# Patient Record
Sex: Female | Born: 1955 | Race: Black or African American | Hispanic: No | Smoking: Former smoker
Health system: Southern US, Community
[De-identification: ages and names within clinical notes are randomized; demographics above are authoritative.]

## PROBLEM LIST (undated history)

## (undated) ENCOUNTER — Emergency Department (HOSPITAL_COMMUNITY)

## (undated) DIAGNOSIS — C801 Malignant (primary) neoplasm, unspecified: Secondary | ICD-10-CM

## (undated) DIAGNOSIS — I700 Atherosclerosis of aorta: Secondary | ICD-10-CM

## (undated) DIAGNOSIS — I10 Essential (primary) hypertension: Secondary | ICD-10-CM

## (undated) DIAGNOSIS — F29 Unspecified psychosis not due to a substance or known physiological condition: Secondary | ICD-10-CM

## (undated) DIAGNOSIS — T50901A Poisoning by unspecified drugs, medicaments and biological substances, accidental (unintentional), initial encounter: Secondary | ICD-10-CM

## (undated) DIAGNOSIS — F319 Bipolar disorder, unspecified: Secondary | ICD-10-CM

## (undated) DIAGNOSIS — M1990 Unspecified osteoarthritis, unspecified site: Secondary | ICD-10-CM

## (undated) DIAGNOSIS — J45909 Unspecified asthma, uncomplicated: Secondary | ICD-10-CM

## (undated) DIAGNOSIS — R0600 Dyspnea, unspecified: Secondary | ICD-10-CM

## (undated) DIAGNOSIS — E7800 Pure hypercholesterolemia, unspecified: Secondary | ICD-10-CM

## (undated) DIAGNOSIS — F419 Anxiety disorder, unspecified: Secondary | ICD-10-CM

## (undated) DIAGNOSIS — F209 Schizophrenia, unspecified: Secondary | ICD-10-CM

## (undated) DIAGNOSIS — F17200 Nicotine dependence, unspecified, uncomplicated: Secondary | ICD-10-CM

## (undated) DIAGNOSIS — M549 Dorsalgia, unspecified: Secondary | ICD-10-CM

## (undated) DIAGNOSIS — G4700 Insomnia, unspecified: Secondary | ICD-10-CM

## (undated) DIAGNOSIS — G43909 Migraine, unspecified, not intractable, without status migrainosus: Secondary | ICD-10-CM

## (undated) DIAGNOSIS — M7710 Lateral epicondylitis, unspecified elbow: Secondary | ICD-10-CM

## (undated) DIAGNOSIS — F32A Depression, unspecified: Secondary | ICD-10-CM

## (undated) DIAGNOSIS — F410 Panic disorder [episodic paroxysmal anxiety] without agoraphobia: Secondary | ICD-10-CM

## (undated) DIAGNOSIS — K5900 Constipation, unspecified: Secondary | ICD-10-CM

## (undated) DIAGNOSIS — E669 Obesity, unspecified: Secondary | ICD-10-CM

## (undated) HISTORY — PX: ABDOMINAL HYSTERECTOMY: SHX81

## (undated) HISTORY — DX: Depression, unspecified: F32.A

## (undated) HISTORY — DX: Panic disorder (episodic paroxysmal anxiety): F41.0

## (undated) HISTORY — DX: Nicotine dependence, unspecified, uncomplicated: F17.200

## (undated) HISTORY — DX: Atherosclerosis of aorta: I70.0

## (undated) HISTORY — DX: Lateral epicondylitis, unspecified elbow: M77.10

## (undated) HISTORY — DX: Essential (primary) hypertension: I10

## (undated) HISTORY — DX: Insomnia, unspecified: G47.00

## (undated) HISTORY — DX: Constipation, unspecified: K59.00

## (undated) HISTORY — DX: Anxiety disorder, unspecified: F41.9

## (undated) HISTORY — PX: OTHER SURGICAL HISTORY: SHX169

## (undated) HISTORY — DX: Unspecified osteoarthritis, unspecified site: M19.90

## (undated) HISTORY — DX: Migraine, unspecified, not intractable, without status migrainosus: G43.909

## (undated) HISTORY — PX: COLONOSCOPY: SHX5424

## (undated) HISTORY — DX: Obesity, unspecified: E66.9

## (undated) HISTORY — DX: Dorsalgia, unspecified: M54.9

## (undated) HISTORY — DX: Unspecified psychosis not due to a substance or known physiological condition: F29

## (undated) HISTORY — DX: Bipolar disorder, unspecified: F31.9

## (undated) HISTORY — DX: Poisoning by unspecified drugs, medicaments and biological substances, accidental (unintentional), initial encounter: T50.901A

## (undated) HISTORY — DX: Schizophrenia, unspecified: F20.9

---

## 2002-05-08 ENCOUNTER — Emergency Department (HOSPITAL_COMMUNITY): Admission: EM | Admit: 2002-05-08 | Discharge: 2002-05-08 | Payer: Self-pay | Admitting: Emergency Medicine

## 2002-12-29 ENCOUNTER — Emergency Department (HOSPITAL_COMMUNITY): Admission: EM | Admit: 2002-12-29 | Discharge: 2002-12-29 | Payer: Self-pay | Admitting: Emergency Medicine

## 2003-02-06 ENCOUNTER — Emergency Department (HOSPITAL_COMMUNITY): Admission: EM | Admit: 2003-02-06 | Discharge: 2003-02-06 | Payer: Self-pay | Admitting: *Deleted

## 2003-04-09 ENCOUNTER — Emergency Department (HOSPITAL_COMMUNITY): Admission: EM | Admit: 2003-04-09 | Discharge: 2003-04-09 | Payer: Self-pay | Admitting: Emergency Medicine

## 2003-04-09 ENCOUNTER — Inpatient Hospital Stay (HOSPITAL_COMMUNITY): Admission: EM | Admit: 2003-04-09 | Discharge: 2003-04-14 | Payer: Self-pay | Admitting: Psychiatry

## 2003-05-02 ENCOUNTER — Emergency Department (HOSPITAL_COMMUNITY): Admission: EM | Admit: 2003-05-02 | Discharge: 2003-05-02 | Payer: Self-pay | Admitting: Emergency Medicine

## 2003-05-04 ENCOUNTER — Ambulatory Visit (HOSPITAL_COMMUNITY): Admission: RE | Admit: 2003-05-04 | Discharge: 2003-05-04 | Payer: Self-pay | Admitting: Family Medicine

## 2003-05-30 ENCOUNTER — Emergency Department (HOSPITAL_COMMUNITY): Admission: EM | Admit: 2003-05-30 | Discharge: 2003-05-31 | Payer: Self-pay | Admitting: *Deleted

## 2003-06-21 ENCOUNTER — Emergency Department (HOSPITAL_COMMUNITY): Admission: EM | Admit: 2003-06-21 | Discharge: 2003-06-22 | Payer: Self-pay | Admitting: *Deleted

## 2003-08-13 ENCOUNTER — Emergency Department (HOSPITAL_COMMUNITY): Admission: EM | Admit: 2003-08-13 | Discharge: 2003-08-13 | Payer: Self-pay | Admitting: Emergency Medicine

## 2003-08-19 ENCOUNTER — Inpatient Hospital Stay (HOSPITAL_COMMUNITY): Admission: RE | Admit: 2003-08-19 | Discharge: 2003-08-25 | Payer: Self-pay | Admitting: Psychiatry

## 2004-10-19 ENCOUNTER — Emergency Department (HOSPITAL_COMMUNITY): Admission: EM | Admit: 2004-10-19 | Discharge: 2004-10-19 | Payer: Self-pay | Admitting: *Deleted

## 2004-10-21 ENCOUNTER — Emergency Department (HOSPITAL_COMMUNITY): Admission: EM | Admit: 2004-10-21 | Discharge: 2004-10-21 | Payer: Self-pay | Admitting: Emergency Medicine

## 2005-01-10 ENCOUNTER — Emergency Department (HOSPITAL_COMMUNITY): Admission: EM | Admit: 2005-01-10 | Discharge: 2005-01-10 | Payer: Self-pay | Admitting: Emergency Medicine

## 2005-01-30 ENCOUNTER — Emergency Department (HOSPITAL_COMMUNITY): Admission: EM | Admit: 2005-01-30 | Discharge: 2005-01-30 | Payer: Self-pay | Admitting: Emergency Medicine

## 2005-03-27 ENCOUNTER — Emergency Department (HOSPITAL_COMMUNITY): Admission: EM | Admit: 2005-03-27 | Discharge: 2005-03-27 | Payer: Self-pay | Admitting: Emergency Medicine

## 2005-07-06 ENCOUNTER — Inpatient Hospital Stay (HOSPITAL_COMMUNITY): Admission: RE | Admit: 2005-07-06 | Discharge: 2005-07-13 | Payer: Self-pay | Admitting: Psychiatry

## 2005-07-07 ENCOUNTER — Ambulatory Visit: Payer: Self-pay | Admitting: Psychiatry

## 2005-07-08 ENCOUNTER — Encounter: Payer: Self-pay | Admitting: Emergency Medicine

## 2005-07-19 ENCOUNTER — Ambulatory Visit: Payer: Self-pay | Admitting: Family Medicine

## 2005-07-31 ENCOUNTER — Ambulatory Visit (HOSPITAL_COMMUNITY): Admission: RE | Admit: 2005-07-31 | Discharge: 2005-07-31 | Payer: Self-pay | Admitting: Family Medicine

## 2005-08-23 ENCOUNTER — Ambulatory Visit: Payer: Self-pay | Admitting: Family Medicine

## 2005-10-04 ENCOUNTER — Emergency Department (HOSPITAL_COMMUNITY): Admission: EM | Admit: 2005-10-04 | Discharge: 2005-10-04 | Payer: Self-pay | Admitting: Emergency Medicine

## 2005-10-23 ENCOUNTER — Ambulatory Visit: Payer: Self-pay | Admitting: Family Medicine

## 2005-10-23 ENCOUNTER — Ambulatory Visit (HOSPITAL_COMMUNITY): Admission: RE | Admit: 2005-10-23 | Discharge: 2005-10-23 | Payer: Self-pay | Admitting: Family Medicine

## 2006-01-23 ENCOUNTER — Ambulatory Visit: Payer: Self-pay | Admitting: Family Medicine

## 2006-04-03 ENCOUNTER — Ambulatory Visit: Payer: Self-pay | Admitting: Cardiology

## 2006-04-04 ENCOUNTER — Ambulatory Visit: Payer: Self-pay | Admitting: Family Medicine

## 2006-08-18 ENCOUNTER — Emergency Department (HOSPITAL_COMMUNITY): Admission: EM | Admit: 2006-08-18 | Discharge: 2006-08-18 | Payer: Self-pay | Admitting: Emergency Medicine

## 2006-10-16 ENCOUNTER — Ambulatory Visit: Payer: Self-pay | Admitting: Cardiology

## 2006-10-16 ENCOUNTER — Ambulatory Visit (HOSPITAL_COMMUNITY): Admission: EM | Admit: 2006-10-16 | Discharge: 2006-10-17 | Payer: Self-pay | Admitting: Cardiovascular Disease

## 2006-10-30 ENCOUNTER — Ambulatory Visit: Payer: Self-pay | Admitting: Family Medicine

## 2006-11-19 ENCOUNTER — Ambulatory Visit (HOSPITAL_COMMUNITY): Admission: RE | Admit: 2006-11-19 | Discharge: 2006-11-19 | Payer: Self-pay | Admitting: Family Medicine

## 2006-12-20 ENCOUNTER — Encounter: Payer: Self-pay | Admitting: Family Medicine

## 2006-12-20 LAB — CONVERTED CEMR LAB
AST: 15 units/L (ref 0–37)
Albumin: 4.2 g/dL (ref 3.5–5.2)
Alkaline Phosphatase: 69 units/L (ref 39–117)
BUN: 9 mg/dL (ref 6–23)
Bilirubin, Direct: <0.1 mg/dL (ref 0.0–0.3)
CO2: 22 meq/L (ref 19–32)
Calcium: 9.1 mg/dL (ref 8.4–10.5)
Chloride: 106 meq/L (ref 96–112)
Creatinine, Ser: 0.72 mg/dL (ref 0.40–1.20)
Glucose, Bld: 88 mg/dL (ref 70–99)
HDL: 39 mg/dL — ABNORMAL LOW (ref >39)
LDL Cholesterol: 139 mg/dL — ABNORMAL HIGH (ref 0–99)
Total Bilirubin: 0.2 mg/dL — ABNORMAL LOW (ref 0.3–1.2)

## 2006-12-23 ENCOUNTER — Emergency Department (HOSPITAL_COMMUNITY): Admission: EM | Admit: 2006-12-23 | Discharge: 2006-12-23 | Payer: Self-pay | Admitting: Emergency Medicine

## 2006-12-25 ENCOUNTER — Ambulatory Visit: Payer: Self-pay | Admitting: Family Medicine

## 2007-01-28 ENCOUNTER — Emergency Department (HOSPITAL_COMMUNITY): Admission: EM | Admit: 2007-01-28 | Discharge: 2007-01-28 | Payer: Self-pay | Admitting: Emergency Medicine

## 2007-02-19 ENCOUNTER — Emergency Department (HOSPITAL_COMMUNITY): Admission: EM | Admit: 2007-02-19 | Discharge: 2007-02-19 | Payer: Self-pay | Admitting: Emergency Medicine

## 2007-04-11 ENCOUNTER — Emergency Department (HOSPITAL_COMMUNITY): Admission: EM | Admit: 2007-04-11 | Discharge: 2007-04-11 | Payer: Self-pay | Admitting: Emergency Medicine

## 2007-05-09 ENCOUNTER — Ambulatory Visit: Payer: Self-pay | Admitting: Family Medicine

## 2007-06-20 ENCOUNTER — Encounter: Payer: Self-pay | Admitting: Family Medicine

## 2007-11-13 ENCOUNTER — Emergency Department (HOSPITAL_COMMUNITY): Admission: EM | Admit: 2007-11-13 | Discharge: 2007-11-13 | Payer: Self-pay | Admitting: Emergency Medicine

## 2007-11-22 ENCOUNTER — Encounter: Payer: Self-pay | Admitting: Family Medicine

## 2007-11-22 DIAGNOSIS — I10 Essential (primary) hypertension: Secondary | ICD-10-CM | POA: Insufficient documentation

## 2007-11-22 DIAGNOSIS — M549 Dorsalgia, unspecified: Secondary | ICD-10-CM | POA: Insufficient documentation

## 2007-11-22 DIAGNOSIS — E669 Obesity, unspecified: Secondary | ICD-10-CM

## 2007-11-22 DIAGNOSIS — M7710 Lateral epicondylitis, unspecified elbow: Secondary | ICD-10-CM | POA: Insufficient documentation

## 2007-11-22 DIAGNOSIS — F29 Unspecified psychosis not due to a substance or known physiological condition: Secondary | ICD-10-CM | POA: Insufficient documentation

## 2007-11-22 DIAGNOSIS — G43909 Migraine, unspecified, not intractable, without status migrainosus: Secondary | ICD-10-CM | POA: Insufficient documentation

## 2008-03-05 ENCOUNTER — Telehealth: Payer: Self-pay | Admitting: Family Medicine

## 2008-04-30 ENCOUNTER — Emergency Department (HOSPITAL_COMMUNITY): Admission: EM | Admit: 2008-04-30 | Discharge: 2008-04-30 | Payer: Self-pay | Admitting: Emergency Medicine

## 2008-05-08 ENCOUNTER — Telehealth: Payer: Self-pay | Admitting: Family Medicine

## 2008-05-25 ENCOUNTER — Ambulatory Visit: Payer: Self-pay | Admitting: *Deleted

## 2008-05-25 ENCOUNTER — Emergency Department (HOSPITAL_COMMUNITY): Admission: EM | Admit: 2008-05-25 | Discharge: 2008-05-25 | Payer: Self-pay | Admitting: Emergency Medicine

## 2008-05-26 ENCOUNTER — Inpatient Hospital Stay (HOSPITAL_COMMUNITY): Admission: RE | Admit: 2008-05-26 | Discharge: 2008-06-03 | Payer: Self-pay | Admitting: *Deleted

## 2008-06-08 ENCOUNTER — Emergency Department (HOSPITAL_COMMUNITY): Admission: EM | Admit: 2008-06-08 | Discharge: 2008-06-08 | Payer: Self-pay | Admitting: Emergency Medicine

## 2008-06-22 ENCOUNTER — Emergency Department (HOSPITAL_COMMUNITY): Admission: EM | Admit: 2008-06-22 | Discharge: 2008-06-22 | Payer: Self-pay | Admitting: Emergency Medicine

## 2008-07-09 ENCOUNTER — Telehealth: Payer: Self-pay | Admitting: Family Medicine

## 2008-07-09 ENCOUNTER — Ambulatory Visit: Payer: Self-pay | Admitting: Family Medicine

## 2008-07-09 ENCOUNTER — Ambulatory Visit (HOSPITAL_COMMUNITY): Admission: RE | Admit: 2008-07-09 | Discharge: 2008-07-09 | Payer: Self-pay | Admitting: Family Medicine

## 2008-07-09 DIAGNOSIS — R1084 Generalized abdominal pain: Secondary | ICD-10-CM | POA: Insufficient documentation

## 2008-07-09 LAB — CONVERTED CEMR LAB
Bilirubin Urine: NEGATIVE
Glucose, Urine, Semiquant: NEGATIVE
Ketones, urine, test strip: NEGATIVE
Nitrite: NEGATIVE
Protein, U semiquant: NEGATIVE
Specific Gravity, Urine: 1.01
Urobilinogen, UA: 0.2
WBC Urine, dipstick: NEGATIVE
pH: 7

## 2008-07-14 ENCOUNTER — Encounter (HOSPITAL_COMMUNITY): Admission: RE | Admit: 2008-07-14 | Discharge: 2008-08-13 | Payer: Self-pay | Admitting: Family Medicine

## 2008-07-19 DIAGNOSIS — N3000 Acute cystitis without hematuria: Secondary | ICD-10-CM | POA: Insufficient documentation

## 2008-07-20 ENCOUNTER — Emergency Department (HOSPITAL_COMMUNITY): Admission: EM | Admit: 2008-07-20 | Discharge: 2008-07-21 | Payer: Self-pay | Admitting: Emergency Medicine

## 2008-07-23 ENCOUNTER — Ambulatory Visit (HOSPITAL_COMMUNITY): Admission: RE | Admit: 2008-07-23 | Discharge: 2008-07-23 | Payer: Self-pay | Admitting: Family Medicine

## 2008-08-06 ENCOUNTER — Telehealth: Payer: Self-pay | Admitting: Family Medicine

## 2008-08-23 ENCOUNTER — Emergency Department (HOSPITAL_COMMUNITY): Admission: EM | Admit: 2008-08-23 | Discharge: 2008-08-23 | Payer: Self-pay | Admitting: Emergency Medicine

## 2008-09-03 ENCOUNTER — Ambulatory Visit: Payer: Self-pay | Admitting: Family Medicine

## 2008-09-03 DIAGNOSIS — R32 Unspecified urinary incontinence: Secondary | ICD-10-CM

## 2008-09-03 DIAGNOSIS — J209 Acute bronchitis, unspecified: Secondary | ICD-10-CM | POA: Insufficient documentation

## 2008-12-18 ENCOUNTER — Emergency Department (HOSPITAL_COMMUNITY): Admission: EM | Admit: 2008-12-18 | Discharge: 2008-12-18 | Payer: Self-pay | Admitting: Emergency Medicine

## 2008-12-31 ENCOUNTER — Emergency Department (HOSPITAL_COMMUNITY): Admission: EM | Admit: 2008-12-31 | Discharge: 2008-12-31 | Payer: Self-pay | Admitting: Emergency Medicine

## 2008-12-31 ENCOUNTER — Inpatient Hospital Stay (HOSPITAL_COMMUNITY): Admission: AD | Admit: 2008-12-31 | Discharge: 2009-01-03 | Payer: Self-pay | Admitting: Psychiatry

## 2008-12-31 ENCOUNTER — Ambulatory Visit: Payer: Self-pay | Admitting: Psychiatry

## 2009-01-06 ENCOUNTER — Ambulatory Visit: Payer: Self-pay | Admitting: Family Medicine

## 2009-01-12 ENCOUNTER — Telehealth: Payer: Self-pay | Admitting: Family Medicine

## 2009-01-19 ENCOUNTER — Telehealth: Payer: Self-pay | Admitting: Family Medicine

## 2009-01-20 ENCOUNTER — Telehealth: Payer: Self-pay | Admitting: Family Medicine

## 2009-04-28 ENCOUNTER — Ambulatory Visit: Payer: Self-pay | Admitting: Family Medicine

## 2009-04-28 LAB — CONVERTED CEMR LAB
Bilirubin Urine: NEGATIVE
Glucose, Urine, Semiquant: NEGATIVE
WBC Urine, dipstick: NEGATIVE
pH: 5.5

## 2009-04-29 ENCOUNTER — Encounter: Payer: Self-pay | Admitting: Family Medicine

## 2009-04-30 ENCOUNTER — Telehealth: Payer: Self-pay | Admitting: Family Medicine

## 2009-05-04 ENCOUNTER — Telehealth: Payer: Self-pay | Admitting: Family Medicine

## 2009-05-04 ENCOUNTER — Encounter: Payer: Self-pay | Admitting: Family Medicine

## 2009-05-05 ENCOUNTER — Encounter: Payer: Self-pay | Admitting: Family Medicine

## 2009-05-28 ENCOUNTER — Encounter: Payer: Self-pay | Admitting: Family Medicine

## 2009-09-19 ENCOUNTER — Emergency Department (HOSPITAL_COMMUNITY): Admission: EM | Admit: 2009-09-19 | Discharge: 2009-09-19 | Payer: Self-pay | Admitting: Emergency Medicine

## 2010-01-13 ENCOUNTER — Ambulatory Visit: Payer: Self-pay | Admitting: Family Medicine

## 2010-02-28 ENCOUNTER — Telehealth: Payer: Self-pay | Admitting: Family Medicine

## 2010-02-28 ENCOUNTER — Encounter: Payer: Self-pay | Admitting: Family Medicine

## 2010-03-27 ENCOUNTER — Emergency Department (HOSPITAL_COMMUNITY)
Admission: EM | Admit: 2010-03-27 | Discharge: 2010-03-28 | Disposition: A | Discharge status: Other Acute Inpatient Hospital | Payer: Self-pay | Source: Home / Self Care | Admitting: Emergency Medicine

## 2010-03-28 ENCOUNTER — Ambulatory Visit: Payer: Self-pay | Admitting: Psychiatry

## 2010-03-28 ENCOUNTER — Inpatient Hospital Stay (HOSPITAL_COMMUNITY): Admission: AD | Admit: 2010-03-28 | Discharge: 2010-04-01 | Payer: Self-pay | Admitting: Psychiatry

## 2010-04-04 ENCOUNTER — Emergency Department (HOSPITAL_COMMUNITY): Admission: EM | Admit: 2010-04-04 | Discharge: 2010-04-04 | Payer: Self-pay | Admitting: Family Medicine

## 2010-04-15 ENCOUNTER — Encounter: Payer: Self-pay | Admitting: Family Medicine

## 2010-04-28 ENCOUNTER — Encounter: Payer: Self-pay | Admitting: Family Medicine

## 2010-05-18 ENCOUNTER — Ambulatory Visit: Payer: Self-pay | Admitting: Family Medicine

## 2010-05-18 DIAGNOSIS — M25519 Pain in unspecified shoulder: Secondary | ICD-10-CM | POA: Insufficient documentation

## 2010-07-10 ENCOUNTER — Encounter: Payer: Self-pay | Admitting: Family Medicine

## 2010-07-19 NOTE — Letter (Signed)
Summary: progress notes  progress notes   Imported By: Curtis Sites 12/03/2009 10:15:36  _____________________________________________________________________  External Attachment:    Type:   Image     Comment:   External Document

## 2010-07-19 NOTE — Letter (Signed)
Summary: demographic  demographic   Imported By: Curtis Sites 12/03/2009 10:10:58  _____________________________________________________________________  External Attachment:    Type:   Image     Comment:   External Document
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
## 2010-07-19 NOTE — Letter (Signed)
Summary: Letter 1st no show  Letter 1st no show   Imported By: Lind Guest 04/29/2010 10:28:16  _____________________________________________________________________  External Attachment:    Type:   Image     Comment:   External Document

## 2010-07-19 NOTE — Letter (Signed)
Summary: xray  xray   Imported By: Curtis Sites 12/03/2009 10:16:29  _____________________________________________________________________  External Attachment:    Type:   Image     Comment:   External Document

## 2010-07-19 NOTE — Letter (Signed)
Summary: Felipe Drone medical release  deborah maury medical release   Imported By: Lind Guest 04/15/2010 14:00:35  _____________________________________________________________________  External Attachment:    Type:   Image     Comment:   External Document

## 2010-07-19 NOTE — Progress Notes (Signed)
Summary: refill   Phone Note Call from Patient   Summary of Call: pt needs to get blood pressure medicine called in. [facility] [phone] Initial call taken by: Rudene Anda,  February 28, 2010 1:46 PM    Prescriptions: HYDROCHLOROTHIAZIDE 25 MG TABS (HYDROCHLOROTHIAZIDE) one tab by mouth once daily  #30 x 1   Entered by:   Adella Hare LPN   Authorized by:   Syliva Overman MD   Signed by:   Adella Hare LPN on 91/47/8295   Method used:   Electronically to        [facility]  [address] (retail)       [address]       [place], [place]  [address]       Ph: [phone]       Fax: [phone]   RxID:   [id number]

## 2010-07-19 NOTE — Assessment & Plan Note (Signed)
Summary: OV   Vital Signs:  Patient profile:   55 year old female Menstrual status:  hysterectomy Height:      64 inches Weight:      179.50 pounds BMI:     30.92 O2 Sat:      99 % on Room air Pulse rate:   79 / minute Pulse rhythm:   regular Resp:     16 per minute BP sitting:   130 / 80  (left arm)  Nutrition Counseling: Patient's BMI is greater than 25 and therefore counseled on weight management options.  O2 Flow:  Room air CC: follow-up visit Is Patient Diabetic? No Pain Assessment Patient in pain? no        CC:  follow-up visit.  History of Present Illness: Pt reports that she is doing some better. She still has auditory hallucinations which tell her to commit suicide, states up to 3 weeks ago she actually had suicidal ideation because sh has absolutely no income. Feels as though she will gag when she tries o swallow  Denies recent fever or chills. Denies sinus pressure, nasal congestion , ear pain or sore throat. Denies chest congestion, or cough productive of sputum. Denies chest pain, palpitations, PND, orthopnea or leg swelling. Denies abdominal pain, nausea, vomitting, diarrhea or constipation. Denies change in bowel movements or bloody stool. Denies dysuria , frequency, incontinence or hesitancy. Denies  joint pain, swelling, or reduced mobility. Denies headaches, vertigo, seizures.  Denies  rash, lesions, or itch.     Preventive Screening-Counseling & Management  Alcohol-Tobacco     Smoking Cessation Counseling: yes  Current Medications (verified): 1)  Depakote Er 500 Mg Xr24h-Tab (Divalproex Sodium) .... Take 1 Tablet By Mouth Two Times A Day 2)  Abilify 5 Mg Tabs (Aripiprazole) .... Take 1 Tablet By Mouth Once A Day 3)  Hydrochlorothiazide 25 Mg Tabs (Hydrochlorothiazide) .... One Tab By Mouth Once Daily 4)  Prozac 40 Mg Caps (Fluoxetine Hcl) .... One Cap By Mouth Once Daily 5)  Geodon 80 Mg Caps (Ziprasidone Hcl) .... One Cap By Mouth Once  Daily 6)  Trazodone Hcl 100 Mg Tabs (Trazodone Hcl) .... Two Tabs By Mouth At Bedtime 7)  Clonazepam 1 Mg Tabs (Clonazepam) .... One Tab By Mouth Four Times Daily 8)  Clonazepam 2 Mg Tabs (Clonazepam) .... One Tab By Mouth Three Times A Day  Allergies (verified): No Known Drug Allergies  Review of Systems      See HPI General:  Complains of fatigue. GU:  Complains of urinary frequency. Psych:  Complains of anxiety, depression, mental problems, suicidal thoughts/plans, and unusual visions or sounds. Endo:  Denies excessive hunger and excessive thirst. Heme:  Denies abnormal bruising and bleeding. Allergy:  Denies hives or rash and itching eyes.  Physical Exam  General:  Well-developed,well-nourished,in no acute distress; alert,appropriate and cooperative throughout examination HEENT: No facial asymmetry,  EOMI, No sinus tenderness, TM's Clear, oropharynx  pink and moist.   Chest: Clear to auscultation bilaterally.  CVS: S1, S2, No murmurs, No S3.   Abd: Soft, Nontender.  MS: Adequate ROM spine, hips, shoulders and knees.  Ext: No edema.   CNS: CN 2-12 intact, power tone and sensation normal throughout.   Skin: Intact, no visible lesions or rashes.  Psych: Good eye contact, flat affect.  Memory impaired, r depressed appearing.    Impression & Recommendations:  Problem # 1:  MIGRAINE HEADACHE (ICD-346.90) Assessment Deteriorated  Orders: Tylenol 325 mg tab [facility])  Problem # 2:  HYPERTENSION (ICD-401.9) Assessment: Unchanged  The following medications were removed from the medication list:    Lotensin 10 Mg Tabs (Benazepril hcl) .Marland Kitchen... Take 1 tablet by mouth once a day Her updated medication list for this problem includes:    Hydrochlorothiazide 25 Mg Tabs (Hydrochlorothiazide) ..... One tab by mouth once daily  BP today: 130/80 Prior BP: 117/81 (04/28/2009)  Labs Reviewed: K+: 3.5 (12/20/2006) Creat: : 0.72 (12/20/2006)   Chol: 199 (12/20/2006)   HDL: 39  (12/20/2006)   LDL: 139 (12/20/2006)   TG: 107 (12/20/2006)  Problem # 3:  UNSPECIFIED PSYCHOSIS (ICD-298.9) Assessment: Deteriorated pt continues to hallucinate and have suicidal ideation, followed by psych  Complete Medication List: 1)  Depakote Er 500 Mg Xr24h-tab (Divalproex sodium) .... Take 1 tablet by mouth two times a day 2)  Abilify 5 Mg Tabs (Aripiprazole) .... Take 1 tablet by mouth once a day 3)  Hydrochlorothiazide 25 Mg Tabs (Hydrochlorothiazide) .... One tab by mouth once daily 4)  Prozac 40 Mg Caps (Fluoxetine hcl) .... One cap by mouth once daily 5)  Geodon 80 Mg Caps (Ziprasidone hcl) .... One cap by mouth once daily 6)  Trazodone Hcl 100 Mg Tabs (Trazodone hcl) .... Two tabs by mouth at bedtime 7)  Clonazepam 1 Mg Tabs (Clonazepam) .... One tab by mouth four times daily 8)  Clonazepam 2 Mg Tabs (Clonazepam) .... One tab by mouth three times a day  Other Orders: T-Basic Metabolic Panel [phone]) T-Lipid Profile [id number]) T-CBC w/Diff [phone]) T-TSH [phone])  Patient Instructions: 1)  Please schedule a follow-up appointment in 3 months. 2)  Tobacco is very bad for your health and your loved ones! You Should stop smoking!. 3)  Stop Smoking Tips: Choose a Quit date. Cut down before the Quit date. decide what you will do as a substitute when you feel the urge to smoke(gum,toothpick,exercise). 4)  BMP prior to visit, ICD-9: 5)  Lipid Panel prior to visit, ICD-9: fasting asap 6)  TSH prior to visit, ICD-9: 7)  CBC w/ Diff prior to visit, ICD-9: 8)  Pls call the number for help with health care through [facility] 9)  Schedule your mammogram.This is needed 10)  Schedule a colonoscopy/sigmoidoscopy to help detect colon cancer.This is needed 11)  pls know that things have got to improve, you should qualify for disability on mental health grounds Prescriptions: HYDROCHLOROTHIAZIDE 25 MG TABS (HYDROCHLOROTHIAZIDE) one tab by mouth once daily  #30 x 3    Entered by:   Adella Hare LPN   Authorized by:   Syliva Overman MD   Signed by:   Adella Hare LPN on 28/41/3244   Method used:   Electronically to        [facility]  [address] (retail)       [address]       [place], [place]  [address]       Ph: [phone]       Fax: [phone]   RxID:   [id number]    Medication Administration  Medication # 1:    Medication: Tylenol 325 mg tab    Diagnosis: MIGRAINE HEADACHE (ICD-346.90)    Dose: 1 tablet    Route: po    Exp Date: 10/12    Lot #: [id number]    Mfr: [facility]    Patient tolerated medication without complications    Given by: Adella Hare LPN (January 13, 2010 10:27 AM)  Orders Added: 1)  Est. Patient Level IV [[address]] 2)  T-Basic Metabolic Panel [[id number]] 3)  T-Lipid Profile [[id number]] 4)  T-CBC w/Diff [[id number]] 5)  T-TSH [[id number]] 6)  Tylenol 325 mg tab [EMRORAL]

## 2010-07-19 NOTE — Letter (Signed)
Summary: misc  misc   Imported By: Curtis Sites 12/03/2009 10:14:18  _____________________________________________________________________  External Attachment:    Type:   Image     Comment:   External Document

## 2010-07-19 NOTE — Letter (Signed)
Summary: history and physical  history and physical   Imported By: Curtis Sites 12/03/2009 10:11:40  _____________________________________________________________________  External Attachment:    Type:   Image     Comment:   External Document

## 2010-07-19 NOTE — Letter (Signed)
Summary: labs  labs   Imported By: Curtis Sites 12/03/2009 10:12:52  _____________________________________________________________________  External Attachment:    Type:   Image     Comment:   External Document

## 2010-07-19 NOTE — Letter (Signed)
Summary: phone notes  phone notes   Imported By: Curtis Sites 12/03/2009 10:15:08  _____________________________________________________________________  External Attachment:    Type:   Image     Comment:   External Document

## 2010-07-25 ENCOUNTER — Encounter: Payer: Self-pay | Admitting: Family Medicine

## 2010-08-04 NOTE — Letter (Signed)
Summary: medical release  medical release   Imported By: Lind Guest 07/25/2010 14:05:23  _____________________________________________________________________  External Attachment:    Type:   Image     Comment:   External Document

## 2010-08-31 LAB — TSH: TSH: 2.444 u[IU]/mL (ref 0.350–4.500)

## 2010-08-31 LAB — CBC
HCT: 36.2 % (ref 36.0–46.0)
MCV: 90.7 fL (ref 78.0–100.0)
Platelets: 163 10*3/uL (ref 150–400)
RBC: 3.99 MIL/uL (ref 3.87–5.11)
WBC: 5.2 10*3/uL (ref 4.0–10.5)

## 2010-08-31 LAB — RAPID URINE DRUG SCREEN, HOSP PERFORMED: Barbiturates: NOT DETECTED

## 2010-08-31 LAB — VALPROIC ACID LEVEL: Valproic Acid Lvl: 46 ug/mL — ABNORMAL LOW (ref 50.0–100.0)

## 2010-08-31 LAB — ETHANOL: Alcohol, Ethyl (B): <5 mg/dL (ref 0–10)

## 2010-08-31 LAB — DIFFERENTIAL
Eosinophils Relative: 5 % (ref 0–5)
Lymphocytes Relative: 40 % (ref 12–46)
Lymphs Abs: 2 10*3/uL (ref 0.7–4.0)

## 2010-08-31 LAB — POCT I-STAT, CHEM 8
Calcium, Ion: 1.1 mmol/L — ABNORMAL LOW (ref 1.12–1.32)
HCT: 40 % (ref 36.0–46.0)
Sodium: 143 mEq/L (ref 135–145)
TCO2: 27 mmol/L (ref 0–100)

## 2010-08-31 LAB — URINALYSIS, ROUTINE W REFLEX MICROSCOPIC
Glucose, UA: NEGATIVE mg/dL
Hgb urine dipstick: NEGATIVE
Specific Gravity, Urine: 1.015 (ref 1.005–1.030)
pH: 7.5 (ref 5.0–8.0)

## 2010-08-31 LAB — VITAMIN B12: Vitamin B-12: 680 pg/mL (ref 211–911)

## 2010-08-31 LAB — BASIC METABOLIC PANEL
Chloride: 107 mEq/L (ref 96–112)
Creatinine, Ser: 0.82 mg/dL (ref 0.4–1.2)
GFR calc Af Amer: >60 mL/min (ref >60)
Potassium: 3.3 mEq/L — ABNORMAL LOW (ref 3.5–5.1)
Sodium: 139 mEq/L (ref 135–145)

## 2010-09-14 ENCOUNTER — Encounter: Payer: Self-pay | Admitting: Family Medicine

## 2010-09-16 ENCOUNTER — Encounter: Payer: Self-pay | Admitting: Family Medicine

## 2010-09-19 ENCOUNTER — Ambulatory Visit: Payer: Self-pay | Admitting: Family Medicine

## 2010-09-19 ENCOUNTER — Encounter: Payer: Self-pay | Admitting: Family Medicine

## 2010-09-25 LAB — BASIC METABOLIC PANEL
BUN: 19 mg/dL (ref 6–23)
CO2: 27 mEq/L (ref 19–32)
Calcium: 9.2 mg/dL (ref 8.4–10.5)
Creatinine, Ser: 0.67 mg/dL (ref 0.4–1.2)
GFR calc non Af Amer: >60 mL/min (ref >60)
Glucose, Bld: 84 mg/dL (ref 70–99)

## 2010-09-25 LAB — CBC
HCT: 36.4 % (ref 36.0–46.0)
Hemoglobin: 12.4 g/dL (ref 12.0–15.0)
MCHC: 35.6 g/dL (ref 30.0–36.0)
MCHC: 36.2 g/dL — ABNORMAL HIGH (ref 30.0–36.0)
MCV: 91.5 fL (ref 78.0–100.0)
Platelets: 179 10*3/uL (ref 150–400)
Platelets: 184 10*3/uL (ref 150–400)
RDW: 15 % (ref 11.5–15.5)
WBC: 6.2 10*3/uL (ref 4.0–10.5)

## 2010-09-25 LAB — URINE MICROSCOPIC-ADD ON

## 2010-09-25 LAB — COMPREHENSIVE METABOLIC PANEL
Albumin: 4.1 g/dL (ref 3.5–5.2)
BUN: 8 mg/dL (ref 6–23)
CO2: 26 mEq/L (ref 19–32)
Calcium: 9.5 mg/dL (ref 8.4–10.5)
Chloride: 106 mEq/L (ref 96–112)
Creatinine, Ser: 0.72 mg/dL (ref 0.4–1.2)
GFR calc non Af Amer: >60 mL/min (ref >60)
Total Bilirubin: 0.6 mg/dL (ref 0.3–1.2)

## 2010-09-25 LAB — DIFFERENTIAL
Basophils Absolute: 0 10*3/uL (ref 0.0–0.1)
Basophils Absolute: 0.1 10*3/uL (ref 0.0–0.1)
Basophils Relative: 1 % (ref 0–1)
Lymphocytes Relative: 37 % (ref 12–46)
Monocytes Absolute: 0.3 10*3/uL (ref 0.1–1.0)
Neutro Abs: 2 10*3/uL (ref 1.7–7.7)
Neutro Abs: 3.3 10*3/uL (ref 1.7–7.7)
Neutrophils Relative %: 44 % (ref 43–77)

## 2010-09-25 LAB — URINALYSIS, ROUTINE W REFLEX MICROSCOPIC
Leukocytes, UA: NEGATIVE
Nitrite: NEGATIVE
Specific Gravity, Urine: 1.01 (ref 1.005–1.030)
pH: 6 (ref 5.0–8.0)

## 2010-09-25 LAB — RAPID URINE DRUG SCREEN, HOSP PERFORMED
Amphetamines: NOT DETECTED
Barbiturates: NOT DETECTED
Barbiturates: NOT DETECTED
Benzodiazepines: POSITIVE — AB

## 2010-09-25 LAB — POCT CARDIAC MARKERS: Myoglobin, poc: 58.9 ng/mL (ref 12–200)

## 2010-09-25 LAB — VALPROIC ACID LEVEL: Valproic Acid Lvl: 51 ug/mL (ref 50.0–100.0)

## 2010-10-03 LAB — CBC
HCT: 38.4 % (ref 36.0–46.0)
Hemoglobin: 13.2 g/dL (ref 12.0–15.0)
RDW: 13.3 % (ref 11.5–15.5)

## 2010-10-03 LAB — BASIC METABOLIC PANEL
CO2: 23 mEq/L (ref 19–32)
GFR calc non Af Amer: 49 mL/min — ABNORMAL LOW (ref >60)
Glucose, Bld: 112 mg/dL — ABNORMAL HIGH (ref 70–99)
Potassium: 3.4 mEq/L — ABNORMAL LOW (ref 3.5–5.1)
Sodium: 134 mEq/L — ABNORMAL LOW (ref 135–145)

## 2010-10-03 LAB — DIFFERENTIAL
Basophils Absolute: 0 10*3/uL (ref 0.0–0.1)
Eosinophils Relative: 3 % (ref 0–5)
Lymphocytes Relative: 40 % (ref 12–46)
Monocytes Absolute: 0.4 10*3/uL (ref 0.1–1.0)

## 2010-10-03 LAB — POCT CARDIAC MARKERS
CKMB, poc: 1.5 ng/mL (ref 1.0–8.0)
Myoglobin, poc: 53.6 ng/mL (ref 12–200)
Troponin i, poc: <0.05 ng/mL (ref 0.00–0.09)
Troponin i, poc: <0.05 ng/mL (ref 0.00–0.09)

## 2010-10-04 LAB — POCT CARDIAC MARKERS
CKMB, poc: 1.2 ng/mL (ref 1.0–8.0)
CKMB, poc: <1 ng/mL — ABNORMAL LOW (ref 1.0–8.0)
Myoglobin, poc: 105 ng/mL (ref 12–200)
Troponin i, poc: <0.05 ng/mL (ref 0.00–0.09)
Troponin i, poc: <0.05 ng/mL (ref 0.00–0.09)

## 2010-10-04 LAB — BASIC METABOLIC PANEL WITH GFR
BUN: 11 mg/dL (ref 6–23)
CO2: 20 meq/L (ref 19–32)
Calcium: 10.4 mg/dL (ref 8.4–10.5)
Chloride: 106 meq/L (ref 96–112)
Creatinine, Ser: 1.09 mg/dL (ref 0.4–1.2)
GFR calc non Af Amer: 53 mL/min — ABNORMAL LOW
Glucose, Bld: 102 mg/dL — ABNORMAL HIGH (ref 70–99)
Potassium: 3.4 meq/L — ABNORMAL LOW (ref 3.5–5.1)
Sodium: 137 meq/L (ref 135–145)

## 2010-10-04 LAB — D-DIMER, QUANTITATIVE: D-Dimer, Quant: 0.33 ug/mL-FEU (ref 0.00–0.48)

## 2010-10-04 LAB — CBC
HCT: 38.9 % (ref 36.0–46.0)
Hemoglobin: 13.8 g/dL (ref 12.0–15.0)
MCHC: 35.3 g/dL (ref 30.0–36.0)
MCV: 91.9 fL (ref 78.0–100.0)
Platelets: 256 K/uL (ref 150–400)
RBC: 4.24 MIL/uL (ref 3.87–5.11)
RDW: 13.5 % (ref 11.5–15.5)
WBC: 6.8 K/uL (ref 4.0–10.5)

## 2010-10-04 LAB — DIFFERENTIAL
Basophils Absolute: 0 K/uL (ref 0.0–0.1)
Basophils Relative: 0 % (ref 0–1)
Eosinophils Absolute: 0.1 K/uL (ref 0.0–0.7)
Eosinophils Relative: 2 % (ref 0–5)
Lymphocytes Relative: 34 % (ref 12–46)
Lymphs Abs: 2.3 K/uL (ref 0.7–4.0)
Monocytes Absolute: 0.5 K/uL (ref 0.1–1.0)
Monocytes Relative: 7 % (ref 3–12)
Neutro Abs: 3.9 K/uL (ref 1.7–7.7)
Neutrophils Relative %: 57 % (ref 43–77)

## 2010-10-18 ENCOUNTER — Emergency Department (HOSPITAL_COMMUNITY)
Admission: EM | Admit: 2010-10-18 | Discharge: 2010-10-18 | Disposition: A | Discharge status: Home / Self Care | Payer: Medicare Other | Attending: Emergency Medicine | Admitting: Emergency Medicine

## 2010-10-18 ENCOUNTER — Emergency Department (HOSPITAL_COMMUNITY): Payer: Medicare Other

## 2010-10-18 DIAGNOSIS — M25669 Stiffness of unspecified knee, not elsewhere classified: Secondary | ICD-10-CM | POA: Insufficient documentation

## 2010-10-18 DIAGNOSIS — Z79899 Other long term (current) drug therapy: Secondary | ICD-10-CM | POA: Insufficient documentation

## 2010-10-18 DIAGNOSIS — F341 Dysthymic disorder: Secondary | ICD-10-CM | POA: Insufficient documentation

## 2010-10-18 DIAGNOSIS — I10 Essential (primary) hypertension: Secondary | ICD-10-CM | POA: Insufficient documentation

## 2010-10-18 DIAGNOSIS — F319 Bipolar disorder, unspecified: Secondary | ICD-10-CM | POA: Insufficient documentation

## 2010-10-18 DIAGNOSIS — M25569 Pain in unspecified knee: Secondary | ICD-10-CM | POA: Insufficient documentation

## 2010-10-18 DIAGNOSIS — G8929 Other chronic pain: Secondary | ICD-10-CM | POA: Insufficient documentation

## 2010-10-19 ENCOUNTER — Encounter: Payer: Self-pay | Admitting: Family Medicine

## 2010-11-01 NOTE — Discharge Summary (Signed)
NAMERUCHI, STONEY                ACCOUNT NO.:  [id number]   MEDICAL RECORD NO.:  [id number]          PATIENT TYPE:  IPS   LOCATION:  0403                          FACILITY:  [facility]   PHYSICIAN:  Anselm Jungling, MD  DATE OF BIRTH:  07-24-1955   DATE OF ADMISSION:  12/31/2008  DATE OF DISCHARGE:  01/03/2009                               DISCHARGE SUMMARY   IDENTIFYING DATA AND REASON FOR ADMISSION:  This was an inpatient  psychiatric admission for Ashley Patrick, a 55 year old African American female  who was admitted due to increasing symptoms of depression, associated  with auditory hallucinations.  This followed her medical physician  recently changing all of her psychotropic medications.  Please refer to  the admission note for further details pertaining to the symptoms,  circumstances and history that led to her hospitalization.  She was  given an initial Axis I diagnosis of depressive disorder NOS, psychosis  NOS, and rule out schizophrenia, and rule out schizoaffective disorder  NOS.   MEDICAL AND LABORATORY:  The patient was medically and physically  assessed by the psychiatric nurse practitioner.  She came to [place] with a  history of hypertension.  She was continued on hydrochlorothiazide 25 mg  daily.  There were no significant medical issues.   HOSPITAL COURSE:  The patient was admitted to the adult inpatient  psychiatric service.  She presented as a well-nourished, normally-  developed adult female who was awake, alert, and fully oriented.  She  showed various self-inflicted scrapes on her arms that she had inflicted  at the behest of auditory hallucinations and voices.  Her thoughts and  speech were clear however, and she did not express any delusional  thinking.  Her mood was depressed, but she denied any suicidal plan or  intent.  She verbalized a strong desire for help.   The patient had previously done well on her former regimen of Geodon.  She was restarted on Geodon 80 mg  b.i.d., and in addition was treated  with Depakote, Xanax, Prozac, and for sleep, trazodone.  These were all  well tolerated.   The patient gave consent first to contact her boyfriend regarding family  conferencing.   She participated in various therapeutic groups and activities.  She was  generally cheerful, affable, and sociable.   By the third hospital day, the patient stated I feel much better.  She  appeared to be calm, cooperative, and rational.  She was able to engage  in a discharge plan that involved going home the following day.  Although we discussed the possibility of a family session with her  boyfriend, he was unable to arrange transportation to come to the  facility for this to happen.   The patient agreed to the following aftercare plan.   AFTERCARE:  The patient was to follow up with Dr. Thomasena Edis in [place]  with an appointment on February 28, 2009.   DISCHARGE MEDICATIONS:  1. Prozac 30 mg daily.  2. Ditropan 5 mg daily.  3. Hydrochlorothiazide 25 mg daily.  4. Geodon 80 mg b.i.d.  5. Trazodone 100 mg  nightly.  6. Depakote 500 mg nightly.  7. Xanax 1 mg q.i.d.   DISCHARGE DIAGNOSES:  AXIS I:  Bipolar disorder NOS.  AXIS II:  Deferred.  AXIS III:  History of hypertension.  AXIS IV:  Stressors severe.  AXIS V:  Global Assessment of Functioning on discharge 55.      Anselm Jungling, MD  Electronically Signed     SPB/MEDQ  D:  01/04/2009  T:  01/04/2009  Job:  [id number]

## 2010-11-01 NOTE — Discharge Summary (Signed)
NAMEJILLIANE, Ashley Patrick                ACCOUNT NO.:  [id number]   MEDICAL RECORD NO.:  [id number]          PATIENT TYPE:  OIB   LOCATION:  4738                         FACILITY:  [facility]   PHYSICIAN:  Ashley Abed, MD, FACCDATE OF BIRTH:  [birth year]   DATE OF ADMISSION:  10/16/2006  DATE OF DISCHARGE:  10/17/2006                               DISCHARGE SUMMARY   PRIMARY CARDIOLOGIST:  Dr. Andee Patrick   PRIMARY CARE Ashley Patrick:  Dr. Lodema Patrick, I believe, in [place]   PRINCIPAL DIAGNOSIS:  Chest pain.   SECONDARY DIAGNOSES:  1. Nonobstructive coronary artery disease.  2. Hypertension.  3. Hyperlipidemia.  4. Depression.  5. Bipolar disorder.  6. Status post partial hysterectomy.   ALLERGIES:  No known drug allergies.   PROCEDURES:  Left heart cardiac catheterization.   HISTORY OF PRESENT ILLNESS:  A 55 year old female with no prior history  of CAD, but with multiple risk factors who was admitted to [facility] October 16, 2006 with complaints of chest discomfort and the  sensation of an elephant sitting on her chest with radiation down the  right arm and associated with diaphoresis and dyspnea.  After being seen  by Ashley Serpe, PA as well as Dr. Andee Patrick, decision was made to transfer  to [facility] for further evaluation and catheterization.   HOSPITAL COURSE:  [name] ruled out for MI and underwent left heart  cardiac catheterization on April 29 revealing diffuse nonobstructive  coronary artery disease with mild global LV dysfunction with an EF of  50%.  She has not had any additional chest discomfort and will be  discharged home today in satisfactory condition.   DISCHARGE LABS:  Hemoglobin 12.4, hematocrit 35.8, WBC 4.4, platelets  201, sodium 139, potassium 4.5, chloride 103, CO2 28, BUN 12, creatinine  0.92, glucose 97, calcium 9.5, total cholesterol 224, triglycerides 113,  HDL 36, LDL 165.   DISPOSITION:  Patient is being discharged home today in good condition.   FOLLOWUP PLANS AND APPOINTMENTS:  1. She will follow up with Dr. Andee Patrick in approximately two weeks.  2. She is asked to follow up with Dr. Lodema Patrick in one to two weeks.   DISCHARGE MEDICATIONS:  1. Xanax 1 mg b.i.d.  2. Zoloft 100 mg b.i.d.  3. HCTZ 25 mg q.d.  4. Cogentin 1 mg b.i.d.  5. Risperdal 3 mg b.i.d.  6. Aspirin 81 mg q.d.  7. Simvastatin 40 mg q.p.m.   OUTSTANDING LABS AND STUDIES:  None.   Duration of discharge encounter:  32 minutes including physician time.      Ashley Patrick, ANP      Ashley Abed, MD, [facility]  Electronically Signed    CB/MEDQ  D:  10/17/2006  T:  10/17/2006  Job:  [id number]   cc:   Dr. Lodema Patrick

## 2010-11-01 NOTE — Cardiovascular Report (Signed)
NAME:  Ashley Patrick, Ashley Patrick                ACCOUNT NO.:  [id number]   MEDICAL RECORD NO.:  [id number]          PATIENT TYPE:  OIB   LOCATION:  4738                         FACILITY:  [facility]   PHYSICIAN:  Veverly Fells. Excell Seltzer, MD  DATE OF BIRTH:  Oct 02, 1955   DATE OF PROCEDURE:  10/16/2006  DATE OF DISCHARGE:                            CARDIAC CATHETERIZATION   PROCEDURES:  1. Left heart catheterization.  2. Selective coronary angiography.  3. Left ventricular angiography.  4. Star Close of the right femoral artery.   INDICATIONS:  Ashley Patrick is a 55 year old woman who presented to  [facility] with chest pain.  In the setting of multiple cardiac  risk factors and ongoing chest pain, she was referred to [facility] for  an inpatient cardiac catheterization procedure.  She had negative  cardiac biomarkers and nonspecific EKG changes.   Risks and indications of the procedure were explained to the patient.  Informed consent was obtained.  The right groin was prepped, draped and  anesthetized with 1% lidocaine.  Using the modified Seldinger technique,  a 6-French sheath was placed in the right femoral artery.  Multiple  angiographic views of both the left and right coronary arteries were  taken using standard preformed Judkins catheters.  Following selective  coronary angiography, an angled pigtail catheter was inserted into the  left ventricle and pressures were recorded.  A 30-degree RAO left  ventriculogram was performed.  A pullback across the aortic valve was  done.  At the conclusion of the diagnostic procedure, a Star Close  device was used to seal the right femoral arteriotomy.  All catheter  exchanges were performed over a guidewire.  There were no immediate  complications.   FINDINGS:  Aortic pressure is 126/82 with a mean of 105, left  ventricular pressure is 124/14 with an end-diastolic pressure of 19.   CORONARY ANGIOGRAPHY:  The left mainstem is angiographically normal.   It  bifurcates into the LAD and left circumflex.   The LAD is a large-caliber vessel in its proximal aspect.  It is heavily  calcified.  There are diffuse nonobstructive luminal irregularities  along the proximal LAD.  There is a first diagonal branch that is fairly  small in caliber and that has diffuse nonobstructive disease, followed  by a second diagonal branch that arises from the midportion of the LAD  that also has diffuse nonobstructive disease and is fairly small out in  its mid and distal aspect.  The remaining portions of the mid and distal  LAD have no significant angiographic disease.   The left circumflex is a large-caliber vessel.  It gives off two large  left posterolateral branches.  There is no significant angiographic  disease seen throughout the left circumflex system.   The right coronary artery is diffusely diseased.  There does not appear  to be any obstructive disease in that vessel.  The proximal aspect has  luminal irregularities throughout, with no greater than 20% stenosis.  The mid aspect of the right coronary artery also has diffuse  nonobstructive disease, as does the distal aspect.  The  vessel gives off  a medium-sized RV marginal branch from its midportion.  Distally it  terminates in a PDA branch that is diffusely diseased and atretic out in  its mid and distal portions.  There are no significant posterolateral  branches from the right coronary artery.   Left ventriculography demonstrates mild global left ventricular  dysfunction with an LVEF of 50%.  There is no mitral regurgitation  present.   ASSESSMENT:  1. Diffuse nonobstructive coronary artery disease.  2. Mild global left ventricular dysfunction.   PLAN:  Ashley Patrick will require aggressive medical therapy for her  coronary artery disease.  She should be on high-dose statin therapy and  antiplatelet therapy with aspirin at a minimum.  Consideration should be  made for a beta blocker and ACE  inhibitor as her LV function as is  borderline.  We will plan on observing her overnight and with potential  discharge home tomorrow.      Veverly Fells. Excell Seltzer, MD  Electronically Signed     MDC/MEDQ  D:  10/16/2006  T:  10/17/2006  Job:  [id number]
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
## 2010-11-04 NOTE — Discharge Summary (Signed)
Ashley Patrick, Ashley Patrick                ACCOUNT NO.:  [id number]   MEDICAL RECORD NO.:  [id number]          PATIENT TYPE:  IPS   LOCATION:  0404                          [facility]ACILITY:  [facility]   PHYSICIAN:  Anselm Jungling, MD  DATE OF BIRTH:  04/01/1956   DATE OF ADMISSION:  07/06/2005  DATE OF DISCHARGE:  07/13/2005                                 DISCHARGE SUMMARY   IDENTIFYING DATA AND REASON FOR ADMISSION:  The patient is a 55 year old  African-American female admitted due to increased depression, suicidal  ideation and auditory hallucinations. She had been off her regimen of Zoloft  and Risperdal for some time prior to admission. She reported that she was  thinking about suicide, was severely depressed, but had no intention or plan  of suicide and wanted help and treatment. Please refer to the admission note  for further details pertaining to the symptoms, circumstances and history  that led to her hospitalization. She was given an initial Axis I diagnosis  of major depressive disorder, recurrent with psychotic features.   MEDICAL AND LABORATORY:  The patient was physically assessed by the  psychiatric nurse practitioner upon admission. As she was continued on her  usual hydrochlorothiazide 25 mg daily for hypertension. Admission laboratory  showed, on the CBC, slightly reduced hematocrit at 35.7. Routine chemistry  panel showed slightly reduced potassium 3.1. Otherwise within normal limits.  Hemoglobin A1c was within normal limits. TSH was within normal limits.  Urinalysis was within normal limits.   HOSPITAL COURSE:  The patient was admitted to the adult inpatient  psychiatric service. She was restarted on Risperdal and Zoloft, and doses  were adjusted during her stay to optimal levels. Klonopin was utilized to  address anxiety symptoms at low doses of 0.25 mg b.i.d. and 0.5 mg q.p.m.Marland Kitchen   A trial of Wellbutrin was initiated in hopes of a better antidepressant  response.   The patient  was a good participant in the treatment program. She was  cooperative and pleasant throughout. She remained quite depressed, and had  intermittent thoughts of ending her life during her inpatient stay, but not  at the time of discharge.   She cited stressors of demands at home, feeling that she was having  difficulty meeting expectations of her daughter and boyfriend. A family  meeting occurred involving the patient, her boyfriend, and her adult  daughter, mediated by the psychiatric counselor. This turned out to be very  productive session in which a great deal of support for the patient was  shown. She felt much encouraged at this result.   The patient was felt to be ready for discharge on the sixth hospital day. At  that time she had no active suicidal ideation although she was still quite  depressed.   AFTERCARE:  The patient was to follow-up with [facility]  health, with an appointment on Monday, July 13, 2005.   DISCHARGE MEDICATIONS:  Wellbutrin XL 300 mg daily, Zoloft 50 mg daily,  Risperdal 2 mg q.h.s., and hydrochlorothiazide 25 mg daily.   DISCHARGE DIAGNOSES:  AXIS I: Major depressive disorder,  recurrent with  psychotic features.  AXIS II: Deferred.  AXIS III: Hypertension.  AXIS IV: Stressors severe.  AXIS V: Global assessment of functioning on discharge 65.           ______________________________  Anselm Jungling, MD  Electronically Signed     SPB/MEDQ  D:  07/18/2005  T:  07/18/2005  Job:  [id number]
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
## 2010-11-04 NOTE — Discharge Summary (Signed)
NAME:  Ashley Patrick, Ashley Patrick                          ACCOUNT NO.:  [id number]   MEDICAL RECORD NO.:  [id number]                   PATIENT TYPE:  IPS   LOCATION:  0301                                 FACILITY:  [facility]   PHYSICIAN:  Jeanice Lim, M.D.              DATE OF BIRTH:  Nov 03, 1955   DATE OF ADMISSION:  04/09/2003  DATE OF DISCHARGE:  04/14/2003                                 DISCHARGE SUMMARY   IDENTIFYING DATA:  This is a 55 year old African-American separated female,  voluntarily admitted, with a history of 1 prior overdose several years ago.  Reported increased depression for 4-5 months, worse in the past month,  reclusive, irritable, obsessive psychomotor agitation, ripped down curtains,  felt fatigued but cannot sleep, reportedly stood at side of road hoping car  would hit her, describing some suicidal thoughts and death wish.   ADMISSION MEDICATIONS:  Avapro.   ALLERGIES:  No known drug allergies.   PHYSICAL EXAMINATION:  Within normal limits, neurologically nonfocal.   ROUTINE ADMISSION LABS:  Within normal limits.  Urine drug screen negative.   MENTAL STATUS EXAM:  Fully alert, huddled in chair, darkened room,  restricted affect, cooperative.  Speech normal, intermittently tearful, mood  irritable, depressed, hopeless.  Thought process goal directed, positive  suicidal ideation without specific plan and some thought agitation.  No  other psychotic symptoms. Cognitively intact.  Judgment and insight fair.  Able to contract for safety.   ADMISSION DIAGNOSES:   AXIS I:  1. Major depressive disorder, recurrent, severe.  2. Rule out post-traumatic stress disorder.   AXIS II:  Deferred.   AXIS III:  Hypertension.   AXIS IV:  Severe, caregiver's stress, problems related to primary support,  occupation, economic problems.   AXIS V:  30/60.   HOSPITAL COURSE:  The patient was admitted and ordered routine p.r.n.  medications, underwent further monitoring, and was  encouraged to participate  in individual, group and milieu therapy.  The patient complained of voices,  mumbling in the background, complained of severe depression and paranoid  ideation.  The patient was adjusted on medications, targeting psychotic  symptoms and mood symptoms, as well as anxiety.  The patient describes  seeing little people with big heads, somewhat bizarre and atypical of  psychosis, describing visual hallucinations as well as voices telling her to  run and jump out the glass window to get out.  These voices are heard inside  the patient's head.  The patient described also having panic attacks.  Medications were optimized, including Xanax, Seroquel, and Risperdal  decreased due to possible EPS and Lexapro was continued.  The patient  reported positive response to medication changes, improvement in mood and  resolution of significant psychotic symptoms.  The patient was discharged  with no dangerous ideation or psychotic symptoms and motivation to be  compliant with the aftercare plan, participating in aftercare planning.   DISCHARGE MEDICATIONS:  1. Cogentin 2 mg 1 q.a.m. and 1 q.h.s.  2. Seroquel 200 mg q.h.s.  3. Avapro 300 mg q.a.m.  4. Hydrochlorothiazide 12.5 mg q.a.m.  5. Lexapro 10 mg q.a.m.  6. Xanax 0.5 mg t.i.d. and 1 q.h.s.  7. Risperdal 0.5 mg q.a.m. and 2 q.h.s.   DISPOSITION:  The patient is to follow up at [facility] November 4 at 6 p.m.   DISCHARGE DIAGNOSES:   AXIS I:  1. Major depressive disorder, recurrent, severe.  2. Rule out post-traumatic stress disorder.   AXIS II:  Deferred.   AXIS III:  Hypertension.   AXIS IV:  Severe, caregiver's stress, problems related to primary support,  occupation, economic problems.   AXIS V:  Global assessment of function on discharge was 55.                                               Jeanice Lim, M.D.    JEM/MEDQ  D:  05/18/2003  T:  05/18/2003  Job:   [id number]

## 2010-11-04 NOTE — Procedures (Signed)
NAME:  Ashley Patrick, Ashley Patrick                          ACCOUNT NO.:  [id number]   MEDICAL RECORD NO.:  [id number]                   PATIENT TYPE:  EMS   LOCATION:  ED                                   FACILITY:  [facility]   PHYSICIAN:  Edward L. Juanetta Gosling, M.D.             DATE OF BIRTH:  [birth year]   DATE OF PROCEDURE:  08/13/2003  DATE OF DISCHARGE:  08/13/2003                                EKG INTERPRETATION   0921, August 13, 2003.  The rhythm is sinus rhythm with a rate in the 70s.  There is ST elevation which is fairly widespread and may indicate early  repolarization versus pericarditis, and clinical correlation is suggested.      ___________________________________________                                            Oneal Deputy. Juanetta Gosling, M.D.   Gwenlyn Found  D:  08/14/2003  T:  08/15/2003  Job:  [id number]

## 2010-11-04 NOTE — Discharge Summary (Signed)
NAME:  Ashley Patrick, Ashley Patrick                          ACCOUNT NO.:  [id number]   MEDICAL RECORD NO.:  [id number]                   PATIENT TYPE:  IPS   LOCATION:  0401                                 FACILITY:  [facility]   PHYSICIAN:  Jeanice Lim, M.D.              DATE OF BIRTH:  [birth year]   DATE OF ADMISSION:  08/19/2003  DATE OF DISCHARGE:  08/25/2003                                 DISCHARGE SUMMARY   IDENTIFYING DATA:  This is a 55 year old African-American female, separated,  voluntarily admitted.  She reported praying that God would take her.  She  was endorsing passive suicidal ideation.  She was sleeping one to three  hours a night, checking locks and doors.  She was followed by Daine Floras, M.D., of [facility].   MEDICATIONS:  1. Lexapro.  2. Xanax.  3. Avapro.  4. Hydrochlorothiazide.  5. Risperdal.   DRUG ALLERGIES:  No known drug allergies.   PHYSICAL EXAMINATION:  GENERAL:  Essentially within normal limits.  NEUROLOGIC:  Nonfocal.   LABORATORY DATA:  Routine admission labs:  Within normal limits.  Potassium  was low at 3.1.  TSH: Within normal limits.   MENTAL STATUS EXAM:  Fully alert female, pleasant, cooperative with affect  flattening, psychomotor slowing.  Otherwise, normal psychomotor activity.  Speech: Low, soft tone.  Mood: Depressed, anxious, fearful, guarded.  Thought process: Goal directed, internally distracted at times, difficulty  tracking conversation, having to search for words.  Positive suicidal  ideation without plan, no flight of ideas, no evidence of hallucinations,  some paranoia.  Cognitive: Intact.  Judgment and insight: Fair to poor.  Intellectual capacity: Within normal limits.  Impulse control: Likely  impaired by psychosis.   ADMISSION DIAGNOSES:   AXIS I:  1. Major depressive disorder, recurrent, severe with psychotic features.  2. Rule out posttraumatic stress disorder.   AXIS II:  None.   AXIS  III:  1. Hypokalemia.  2. Hypertension.   AXIS IV:  Severe stress with family caregiving.   AXIS V:  22/60   HOSPITAL COURSE:  The patient was admitted, ordered routine p.r.n.  medications, underwent further monitoring, and was encouraged to participate  in individual, group, and milieu therapy.  BMET was repeated for hypokalemia  and K-Dur was given to replace potassium.  The patient was optimized on  Risperdal and Lexapro targeting depressive symptoms and psychotic symptoms.  The patient was monitored medically and monitored for response and tolerance  to medications.  The patient reported a positive response.  Medications were  resumed.  Xanax was adjusted.  The patient was treated with Cipro for UTI.  The patient reported positive response to medication changes.   CONDITION ON DISCHARGE:  The patient was discharged in improved condition  with no dangerous ideation, less depressed, showing improved insight and  judgment, reporting motivation to be compliant with the aftercare plan.  Medication education was given.   DISCHARGE MEDICATIONS:  1. Lexapro 10 mg daily.  2. Avapro 300 mg daily.  3. Hydrochlorothiazide 125 mg daily.  4. Risperdal 1 mg b.i.d. and 4 mg q.h.s.  5. Cogentin 1 mg b.i.d.  6. Trazodone 50 mg q.h.s. p.r.n.  7. Xanax 1 mg b.i.d.  8. Seroquel 200 mg q.h.s.   FOLLOW UP:  The patient was discharged to follow up at [facility] March 11 at 10 a.m.   DISCHARGE DIAGNOSES:   AXIS I:  1. Major depressive disorder, recurrent, severe with psychotic features.  2. Rule out posttraumatic stress disorder.   AXIS II:  None.   AXIS III:  1. Hypokalemia.  2. Hypertension.   AXIS IV:  Severe stress with family caregiving.   AXIS V:  Global assessment of functioning on discharge was 50.                                               Jeanice Lim, M.D.    Lovie Macadamia  D:  09/20/2003  T:  09/21/2003  Job:  [id number]

## 2010-12-15 ENCOUNTER — Emergency Department (HOSPITAL_COMMUNITY)
Admission: EM | Admit: 2010-12-15 | Discharge: 2010-12-15 | Disposition: A | Discharge status: Home / Self Care | Payer: Medicare Other | Attending: Emergency Medicine | Admitting: Emergency Medicine

## 2010-12-15 ENCOUNTER — Encounter (HOSPITAL_COMMUNITY): Payer: Self-pay | Admitting: Radiology

## 2010-12-15 ENCOUNTER — Emergency Department (HOSPITAL_COMMUNITY): Payer: Medicare Other

## 2010-12-15 DIAGNOSIS — F410 Panic disorder [episodic paroxysmal anxiety] without agoraphobia: Secondary | ICD-10-CM | POA: Insufficient documentation

## 2010-12-15 DIAGNOSIS — F319 Bipolar disorder, unspecified: Secondary | ICD-10-CM | POA: Insufficient documentation

## 2010-12-15 DIAGNOSIS — Z79899 Other long term (current) drug therapy: Secondary | ICD-10-CM | POA: Insufficient documentation

## 2010-12-15 DIAGNOSIS — R109 Unspecified abdominal pain: Secondary | ICD-10-CM | POA: Insufficient documentation

## 2010-12-15 DIAGNOSIS — I10 Essential (primary) hypertension: Secondary | ICD-10-CM | POA: Insufficient documentation

## 2010-12-15 LAB — DIFFERENTIAL
Basophils Relative: 0 % (ref 0–1)
Lymphocytes Relative: 21 % (ref 12–46)
Monocytes Relative: 9 % (ref 3–12)
Neutro Abs: 5 10*3/uL (ref 1.7–7.7)
Neutrophils Relative %: 69 % (ref 43–77)

## 2010-12-15 LAB — URINALYSIS, ROUTINE W REFLEX MICROSCOPIC
Glucose, UA: NEGATIVE mg/dL
Leukocytes, UA: NEGATIVE
pH: 8 (ref 5.0–8.0)

## 2010-12-15 LAB — COMPREHENSIVE METABOLIC PANEL
AST: 13 U/L (ref 0–37)
Albumin: 3.9 g/dL (ref 3.5–5.2)
CO2: 28 mEq/L (ref 19–32)
Calcium: 8.8 mg/dL (ref 8.4–10.5)
Creatinine, Ser: 0.63 mg/dL (ref 0.50–1.10)
GFR calc non Af Amer: >60 mL/min (ref >60)
Sodium: 144 mEq/L (ref 135–145)
Total Protein: 7.9 g/dL (ref 6.0–8.3)

## 2010-12-15 LAB — CBC
HCT: 36.5 % (ref 36.0–46.0)
Hemoglobin: 12.7 g/dL (ref 12.0–15.0)
MCH: 31 pg (ref 26.0–34.0)
RBC: 4.1 MIL/uL (ref 3.87–5.11)

## 2010-12-15 LAB — URINE MICROSCOPIC-ADD ON

## 2010-12-15 MED ORDER — IOHEXOL 300 MG/ML  SOLN
100.0000 mL | Freq: Once | INTRAMUSCULAR | Status: AC | PRN
  Administered 2010-12-15: 100 mL via INTRAVENOUS

## 2010-12-19 LAB — URINE CULTURE
Colony Count: >=100000
Culture  Setup Time: 201206292134

## 2011-02-04 ENCOUNTER — Encounter (HOSPITAL_COMMUNITY): Payer: Self-pay

## 2011-02-04 ENCOUNTER — Emergency Department (HOSPITAL_COMMUNITY)
Admission: EM | Admit: 2011-02-04 | Discharge: 2011-02-04 | Disposition: A | Discharge status: Home / Self Care | Payer: Medicare Other | Attending: Emergency Medicine | Admitting: Emergency Medicine

## 2011-02-04 DIAGNOSIS — F319 Bipolar disorder, unspecified: Secondary | ICD-10-CM | POA: Insufficient documentation

## 2011-02-04 DIAGNOSIS — R51 Headache: Secondary | ICD-10-CM | POA: Insufficient documentation

## 2011-02-04 DIAGNOSIS — I10 Essential (primary) hypertension: Secondary | ICD-10-CM | POA: Insufficient documentation

## 2011-02-04 DIAGNOSIS — F17200 Nicotine dependence, unspecified, uncomplicated: Secondary | ICD-10-CM | POA: Insufficient documentation

## 2011-02-04 HISTORY — DX: Bipolar disorder, unspecified: F31.9

## 2011-02-04 MED ORDER — HYDROCODONE-ACETAMINOPHEN 5-325 MG PO TABS
2.0000 | ORAL_TABLET | Freq: Once | ORAL | Status: AC
  Administered 2011-02-04: 2 via ORAL
  Filled 2011-02-04: qty 2

## 2011-02-04 NOTE — ED Notes (Signed)
Complain of headache, dizziness and nausea since yesterday

## 2011-02-04 NOTE — ED Provider Notes (Addendum)
History     CSN: [id number] Arrival date & time: 02/04/2011 12:16 PM  Chief Complaint  Patient presents with  . Headache   Patient is a 55 y.o. female presenting with headaches.  Headache    Complains of headache gradual onset 10:00 last night frontal treated herself with one of her blood pressure pills without relief had a constant nonradiating no photophobia no nausea different from prior migraines, though patient didn't headaches approximately once per month nothing makes symptoms better or worse. Denies nausea or dizziness Past Medical History  Diagnosis Date  . Back pain   . Lateral epicondylitis  of elbow   . Obesity   . Nicotine addiction   . Migraine headache   . Psychosis   . Hypertension   . OD (overdose of drug)     hospitalised for 11 days last month   . Suicidal ideation     hospialised pt. slashed her wrists   . Bipolar 1 disorder     Past Surgical History  Procedure Date  . Partial hysterectomy     History reviewed. No pertinent family history.  History  Substance Use Topics  . Smoking status: Current Everyday Smoker  . Smokeless tobacco: Not on file  . Alcohol Use: No    OB History    Grav Para Term Preterm Abortions TAB SAB Ect Mult Living                  Review of Systems  Constitutional: Negative.   Respiratory: Negative.   Cardiovascular: Negative.   Gastrointestinal: Negative.   Musculoskeletal: Negative.   Skin: Negative.   Neurological: Positive for headaches.       Headache  Hematological: Negative.   Psychiatric/Behavioral: Negative.     Physical Exam  BP 155/96  Pulse 81  Temp(Src) 98 F (36.7 C) (Oral)  Resp 20  Ht 5\' 6"  (1.676 m)  Wt 184 lb (83.462 kg)  BMI 29.70 kg/m2  SpO2 99%  Physical Exam  Constitutional: She appears well-developed and well-nourished.  HENT:  Head: Normocephalic and atraumatic.  Eyes: Conjunctivae are normal. Pupils are equal, round, and reactive to light.  Neck: Neck supple. No tracheal  deviation present. No thyromegaly present.  Cardiovascular: Normal rate and regular rhythm.   No murmur heard. Pulmonary/Chest: Effort normal and breath sounds normal.  Abdominal: Soft. Bowel sounds are normal. She exhibits no distension. There is no tenderness.  Musculoskeletal: Normal range of motion. She exhibits no edema and no tenderness.  Neurological: She is alert. She has normal reflexes. Coordination normal.       Gait normal  Skin: Skin is warm and dry. No rash noted.  Psychiatric: She has a normal mood and affect.    ED Course  Procedures Receive 2 hydrocodone-APAP tablets in the emergency department with improvement of pain at 2:20 PM she feels improved the radial home . MDM  I do not feel that headache is of serious etiology patient does headaches frequently this was gradual in onset   Doug Sou, MD 02/04/11 1422  Doug Sou, MD 02/04/11 1423

## 2011-03-15 LAB — BASIC METABOLIC PANEL
CO2: 30
Calcium: 9.3
Creatinine, Ser: 0.8
GFR calc Af Amer: >60
Glucose, Bld: 100 — ABNORMAL HIGH

## 2011-03-15 LAB — DIFFERENTIAL
Basophils Absolute: 0
Eosinophils Absolute: 0.2
Lymphocytes Relative: 36
Neutrophils Relative %: 50

## 2011-03-15 LAB — CBC
MCHC: 36
RDW: 14.3

## 2011-03-15 LAB — B-NATRIURETIC PEPTIDE (CONVERTED LAB): Pro B Natriuretic peptide (BNP): <30

## 2011-03-23 LAB — URINALYSIS, ROUTINE W REFLEX MICROSCOPIC
Ketones, ur: NEGATIVE mg/dL
Leukocytes, UA: NEGATIVE
Nitrite: NEGATIVE
Protein, ur: NEGATIVE mg/dL
Urobilinogen, UA: 0.2 mg/dL (ref 0.0–1.0)
pH: 6 (ref 5.0–8.0)

## 2011-03-23 LAB — CBC
HCT: 40 % (ref 36.0–46.0)
Platelets: 192 10*3/uL (ref 150–400)
RDW: 13.5 % (ref 11.5–15.5)

## 2011-03-23 LAB — DIFFERENTIAL
Basophils Absolute: 0 10*3/uL (ref 0.0–0.1)
Basophils Relative: 1 % (ref 0–1)
Eosinophils Relative: 2 % (ref 0–5)
Lymphocytes Relative: 27 % (ref 12–46)

## 2011-03-23 LAB — RAPID URINE DRUG SCREEN, HOSP PERFORMED
Amphetamines: NOT DETECTED
Benzodiazepines: POSITIVE — AB
Tetrahydrocannabinol: NOT DETECTED

## 2011-03-23 LAB — BASIC METABOLIC PANEL
BUN: 13 mg/dL (ref 6–23)
Calcium: 9.5 mg/dL (ref 8.4–10.5)
GFR calc non Af Amer: >60 mL/min (ref >60)
Glucose, Bld: 103 mg/dL — ABNORMAL HIGH (ref 70–99)

## 2011-03-23 LAB — URINE MICROSCOPIC-ADD ON

## 2011-03-31 LAB — WOUND CULTURE: Gram Stain: NONE SEEN

## 2011-04-04 LAB — URINALYSIS, ROUTINE W REFLEX MICROSCOPIC
Bilirubin Urine: NEGATIVE
Glucose, UA: NEGATIVE
Hgb urine dipstick: NEGATIVE
Specific Gravity, Urine: 1.015
pH: 6.5

## 2011-04-04 LAB — DIFFERENTIAL
Lymphocytes Relative: 32
Monocytes Absolute: 0.5
Monocytes Relative: 9
Neutro Abs: 2.7

## 2011-04-04 LAB — CBC
HCT: 33.8 — ABNORMAL LOW
Hemoglobin: 11.8 — ABNORMAL LOW
MCHC: 35
RBC: 3.82 — ABNORMAL LOW
RDW: 14.5 — ABNORMAL HIGH

## 2011-04-04 LAB — BASIC METABOLIC PANEL
CO2: 28
GFR calc Af Amer: >60
Glucose, Bld: 99
Potassium: 3.7
Sodium: 140

## 2011-06-04 ENCOUNTER — Emergency Department (HOSPITAL_COMMUNITY)
Admission: EM | Admit: 2011-06-04 | Discharge: 2011-06-04 | Disposition: A | Discharge status: Home / Self Care | Payer: Medicare Other | Attending: Emergency Medicine | Admitting: Emergency Medicine

## 2011-06-04 ENCOUNTER — Encounter (HOSPITAL_COMMUNITY): Payer: Self-pay | Admitting: Emergency Medicine

## 2011-06-04 ENCOUNTER — Other Ambulatory Visit: Payer: Self-pay

## 2011-06-04 ENCOUNTER — Emergency Department (HOSPITAL_COMMUNITY): Payer: Medicare Other

## 2011-06-04 DIAGNOSIS — E876 Hypokalemia: Secondary | ICD-10-CM | POA: Insufficient documentation

## 2011-06-04 DIAGNOSIS — F319 Bipolar disorder, unspecified: Secondary | ICD-10-CM | POA: Insufficient documentation

## 2011-06-04 DIAGNOSIS — I10 Essential (primary) hypertension: Secondary | ICD-10-CM | POA: Insufficient documentation

## 2011-06-04 DIAGNOSIS — Z79899 Other long term (current) drug therapy: Secondary | ICD-10-CM | POA: Insufficient documentation

## 2011-06-04 DIAGNOSIS — E669 Obesity, unspecified: Secondary | ICD-10-CM | POA: Insufficient documentation

## 2011-06-04 DIAGNOSIS — R0789 Other chest pain: Secondary | ICD-10-CM | POA: Insufficient documentation

## 2011-06-04 LAB — COMPREHENSIVE METABOLIC PANEL
ALT: 13 U/L (ref 0–35)
Alkaline Phosphatase: 97 U/L (ref 39–117)
CO2: 21 mEq/L (ref 19–32)
GFR calc Af Amer: >90 mL/min (ref >90)
Glucose, Bld: 106 mg/dL — ABNORMAL HIGH (ref 70–99)
Potassium: 3.1 mEq/L — ABNORMAL LOW (ref 3.5–5.1)
Sodium: 140 mEq/L (ref 135–145)
Total Protein: 8.2 g/dL (ref 6.0–8.3)

## 2011-06-04 LAB — CBC
HCT: 40.8 % (ref 36.0–46.0)
Hemoglobin: 14.3 g/dL (ref 12.0–15.0)
MCH: 31 pg (ref 26.0–34.0)
MCHC: 35 g/dL (ref 30.0–36.0)

## 2011-06-04 LAB — CARDIAC PANEL(CRET KIN+CKTOT+MB+TROPI): CK, MB: 2.7 ng/mL (ref 0.3–4.0)

## 2011-06-04 MED ORDER — SODIUM CHLORIDE 0.9 % IJ SOLN: 3.0000 mL | Freq: Two times a day (BID) | INTRAMUSCULAR | Status: DC

## 2011-06-04 MED ORDER — OXYCODONE-ACETAMINOPHEN 5-325 MG PO TABS
1.0000 | ORAL_TABLET | Freq: Once | ORAL | Status: AC
  Administered 2011-06-04: 1 via ORAL
  Filled 2011-06-04: qty 1

## 2011-06-04 MED ORDER — IBUPROFEN 200 MG PO TABS: 400.0000 mg | ORAL_TABLET | Freq: Four times a day (QID) | ORAL | Status: AC | PRN

## 2011-06-04 MED ORDER — POTASSIUM CHLORIDE CRYS ER 20 MEQ PO TBCR
40.0000 meq | EXTENDED_RELEASE_TABLET | Freq: Once | ORAL | Status: AC
  Administered 2011-06-04: 40 meq via ORAL
  Filled 2011-06-04: qty 2

## 2011-06-04 MED ORDER — OXYCODONE-ACETAMINOPHEN 5-325 MG PO TABS: 1.0000 | ORAL_TABLET | Freq: Four times a day (QID) | ORAL | Status: AC | PRN

## 2011-06-04 MED ORDER — ASPIRIN 81 MG PO CHEW
324.0000 mg | CHEWABLE_TABLET | Freq: Once | ORAL | Status: AC
  Administered 2011-06-04: 324 mg via ORAL
  Filled 2011-06-04: qty 3
  Filled 2011-06-04: qty 4

## 2011-06-04 NOTE — ED Provider Notes (Addendum)
History     CSN: [id number] Arrival date & time: 06/04/2011 10:01 AM   None     Chief Complaint  Patient presents with  . Chest Pain    (Consider location/radiation/quality/duration/timing/severity/associated sxs/prior treatment) HPI 55 year old with history of Bipolar, psychosis, suicide attempt, overdose, panic attacks, HTN who presents with chest pain that she has had for several days but has been worse since last night.  She reports that the pain is worse with positional movements and carrying things.  It is located in her upper L chest and is like an ache/pressure.  Non-radiating.  Started 6 days ago.  7-8/10 currently.  She has had some SOB due to the pain and some sweating.  No dizzyness.  No related to meals.  No worse with walking.    No leg pain.  No fevers or chills.    Past Medical History  Diagnosis Date  . Back pain   . Lateral epicondylitis  of elbow   . Obesity   . Nicotine addiction   . Migraine headache   . Psychosis   . Hypertension   . OD (overdose of drug)     hospitalised for 11 days last month   . Suicidal ideation     hospialised pt. slashed her wrists   . Bipolar 1 disorder     Past Surgical History  Procedure Date  . Partial hysterectomy     No family history on file.  History  Substance Use Topics  . Smoking status: Current Everyday Smoker  . Smokeless tobacco: Not on file  . Alcohol Use: No     Review of Systems No headache, numbness/tingling anywhere, dysuria, hematuria, or weight change recently. Else see HPI  Allergies  Review of patient's allergies indicates no known allergies.  Home Medications   Current Outpatient Rx  Name Route Sig Dispense Refill  . ARIPIPRAZOLE 10 MG PO TABS Oral Take 10 mg by mouth at bedtime.      Marland Kitchen CLONAZEPAM 0.5 MG PO TABS Oral Take 0.5 mg by mouth 2 (two) times daily as needed. For anxiety    . DIVALPROEX SODIUM ER 500 MG PO TB24 Oral Take 500 mg by mouth 3 (three) times daily.     Marland Kitchen  FLUOXETINE HCL 10 MG PO CAPS Oral Take 10 mg by mouth daily. Take 3 tablets daily by mouth     . FLUOXETINE HCL 40 MG PO CAPS Oral Take 40 mg by mouth every morning.      Marland Kitchen HYDROCHLOROTHIAZIDE 25 MG PO TABS Oral Take 25 mg by mouth daily.     . THIOTHIXENE 2 MG PO CAPS Oral Take 4 mg by mouth at bedtime.      . TRAZODONE HCL 100 MG PO TABS Oral Take 100 mg by mouth at bedtime.      . TRAZODONE HCL PO Oral Take 100 mg by mouth at bedtime.        BP 157/109  Pulse 102  Resp 28  Ht 5\' 4"  (1.626 m)  Wt 181 lb (82.101 kg)  BMI 31.07 kg/m2  SpO2 100%  Physical Exam  General: alert, well-developed, and cooperative to examination.  Head: normocephalic and atraumatic.  Eyes: vision grossly intact, pupils equal, pupils round, pupils reactive to light, no injection and anicteric.  Mouth: pharynx pink and moist, no erythema, and no exudates.  Neck:  no JVD, and no carotid bruits.  Lungs: normal respiratory effort, no accessory muscle use, normal breath sounds, no crackles, and  no wheezes. Heart: normal rate, regular rhythm, no murmur, no gallop, and no rub.  Chest Wall: Definite reproducible significant tenderness in left upper chest anteriorly.  Patient says the pain induced by palpation here is the pain she presents for.   Abdomen: soft, non-tender, normal bowel sounds, no distention, no guarding, no rebound tenderness. Msk: no joint swelling, no joint warmth, and no redness over joints.  Pulses: 2+ DP/PT pulses bilaterally Extremities: No cyanosis, clubbing, edema Neurologic: alert & oriented X3, cranial nerves II-XII intact, strength normal in all extremities. Skin: turgor normal and no rashes.     ED Course  Procedures (including critical care time)  Labs Reviewed  COMPREHENSIVE METABOLIC PANEL - Abnormal; Notable for the following:    Potassium 3.1 (*)    Glucose, Bld 106 (*)    Calcium 10.7 (*)    GFR calc non Af Amer 80 (*)    All other components within normal limits  CBC    CARDIAC PANEL(CRET KIN+CKTOT+MB+TROPI)  POCT I-STAT TROPONIN I  I-STAT TROPONIN I    Admission on 06/04/2011  Component Date Value Range Status  . WBC (K/uL) 06/04/2011 6.1  4.0-10.5 Final  . RBC (MIL/uL) 06/04/2011 4.61  3.87-5.11 Final  . Hemoglobin (g/dL) 54/02/8118 14.7  82.9-56.2 Final  . HCT (%) 06/04/2011 40.8  36.0-46.0 Final  . MCV (fL) 06/04/2011 88.5  78.0-100.0 Final  . MCH (pg) 06/04/2011 31.0  26.0-34.0 Final  . MCHC (g/dL) 13/01/6577 46.9  62.9-52.8 Final  . RDW (%) 06/04/2011 13.1  11.5-15.5 Final  . Platelets (K/uL) 06/04/2011 202  150-400 Final  . Total CK (U/L) 06/04/2011 168  7-177 Final  . CK, MB (ng/mL) 06/04/2011 2.7  0.3-4.0 Final  . Troponin I (ng/mL) 06/04/2011 <0.30  <0.30 Final   Comment:                                 Due to the release kinetics of cTnI,                          a negative result within the first hours                          of the onset of symptoms does not rule out                          myocardial infarction with certainty.                          If myocardial infarction is still suspected,                          repeat the test at appropriate intervals.  . Relative Index  06/04/2011 1.6  0.0-2.5 Final  . Sodium (mEq/L) 06/04/2011 140  135-145 Final  . Potassium (mEq/L) 06/04/2011 3.1* 3.5-5.1 Final  . Chloride (mEq/L) 06/04/2011 105  96-112 Final  . CO2 (mEq/L) 06/04/2011 21  19-32 Final  . Glucose, Bld (mg/dL) 41/32/4401 027* 25-36 Final  . BUN (mg/dL) 64/40/3474 14  2-59 Final  . Creatinine, Ser (mg/dL) 56/38/7564 3.32  9.51-8.84 Final  . Calcium (mg/dL) 16/60/6301 60.1* 0.9-32.3 Final  . Total Protein (g/dL) 55/73/2202 8.2  5.4-2.7 Final  . Albumin (g/dL) 12/10/7626 4.4  3.1-5.1  Final  . AST (U/L) 06/04/2011 14  0-37 Final  . ALT (U/L) 06/04/2011 13  0-35 Final  . Alkaline Phosphatase (U/L) 06/04/2011 97  39-117 Final  . Total Bilirubin (mg/dL) 16/03/9603 0.4  5.4-0.9 Final  . GFR calc non Af Amer (mL/min)  06/04/2011 80* >90 Final  . GFR calc Af Amer (mL/min) 06/04/2011 >90  >90 Final   Comment:                                 The eGFR has been calculated                          using the CKD EPI equation.                          This calculation has not been                          validated in all clinical                          situations.                          eGFR's persistently                          <90 mL/min signify                          possible Chronic Kidney Disease.  . Troponin i, poc (ng/mL) 06/04/2011 0.00  0.00-0.08 Final  . Comment 3  06/04/2011          Final   Comment: Due to the release kinetics of cTnI,                          a negative result within the first hours                          of the onset of symptoms does not rule out                          myocardial infarction with certainty.                          If myocardial infarction is still suspected,                          repeat the test at appropriate intervals.  ]  Dg Chest Portable 1 View  06/04/2011  *RADIOLOGY REPORT*  Clinical Data: Chest pain  PORTABLE CHEST - 1 VIEW  Comparison: 04/04/2010  Findings: Heart size and mediastinal contours appear normal.  There is no pleural effusion or pulmonary edema identified.  There is no airspace consolidation identified.  Review of the visualized osseous structures is unremarkable.  IMPRESSION:  1.  No active cardiopulmonary abnormalities.  Original Report Authenticated By: Rosealee Albee, M.D.   Hypokalemia: patient given KCl PO  Diagnosis: Musculoskeletal chest pain, hypokalemia   MDM  I discussed this entire case  with my attending Dr. Fonnie Jarvis, who also interviewed and examined the patient.  He agreed with the following assessment plan:  Chest Pain: No EKG changes, cardiac enzymes negative.  No leukocytosis.  Nothing in history or character is concerning for PE.  Based on history and chest wall tenderness, this is almost certainly  musculoskeletal chest wall pain.  Will discharge to home with a few Percocet and she may also take Ibuprofen for this pain.  She should follow-up with her PCP in 1 week to re-eval her CP to see if it is resolving, and also to address her hypokalemia in setting of HCTZ therapy.      Blanca Friend, MD 06/04/11 1119  Blanca Friend, MD 06/04/11 1121

## 2011-06-04 NOTE — ED Provider Notes (Signed)
This 55 year old female has several days of 24-hour a day exactly reproducible left anterior chest wall tenderness type pain which is worse with torso position changes and moving her arms to put on closer takeoff closer bend over or twist but is nonexertional nonpleuritic without cough without shortness of breath.  I saw and evaluated the patient, reviewed the resident's note and I agree with the findings and plan including the ECG and its interpretation.  Patient informed of clinical course, understand medical decision-making process, and agree with plan.  The patient appears reasonably screened and/or stabilized for discharge and I doubt any other medical condition or other [facility] requiring further screening, evaluation, or treatment in the ED at this time prior to discharge.I doubt any other EMC precluding discharge at this time including, but not necessarily limited to the following:ACS.  Hurman Horn, MD 06/04/11 [phone]

## 2011-06-04 NOTE — ED Notes (Signed)
Patient c/o mid chest pain that is crushing in nature. Patient reports pain started Monday and has been intermittent. Pain reoccurring today at 0800, patient anxious and hyperventilating. Reports pain went down left arm once today, denies radiation of pain currently. Patient coached to take slow, deep breaths and is mildly compliant with instructions.   Hx: HTN

## 2011-06-20 DIAGNOSIS — C189 Malignant neoplasm of colon, unspecified: Secondary | ICD-10-CM

## 2011-06-20 HISTORY — DX: Malignant neoplasm of colon, unspecified: C18.9

## 2011-08-11 ENCOUNTER — Other Ambulatory Visit: Payer: Self-pay

## 2011-08-11 ENCOUNTER — Encounter (HOSPITAL_COMMUNITY): Payer: Self-pay | Admitting: *Deleted

## 2011-08-11 ENCOUNTER — Emergency Department (HOSPITAL_COMMUNITY)
Admission: EM | Admit: 2011-08-11 | Discharge: 2011-08-12 | Disposition: A | Discharge status: Home / Self Care | Payer: Medicare Other | Attending: Emergency Medicine | Admitting: Emergency Medicine

## 2011-08-11 ENCOUNTER — Emergency Department (HOSPITAL_COMMUNITY): Payer: Medicare Other

## 2011-08-11 DIAGNOSIS — J209 Acute bronchitis, unspecified: Secondary | ICD-10-CM | POA: Insufficient documentation

## 2011-08-11 DIAGNOSIS — R197 Diarrhea, unspecified: Secondary | ICD-10-CM | POA: Insufficient documentation

## 2011-08-11 DIAGNOSIS — R059 Cough, unspecified: Secondary | ICD-10-CM | POA: Insufficient documentation

## 2011-08-11 DIAGNOSIS — I10 Essential (primary) hypertension: Secondary | ICD-10-CM | POA: Insufficient documentation

## 2011-08-11 DIAGNOSIS — R05 Cough: Secondary | ICD-10-CM | POA: Insufficient documentation

## 2011-08-11 DIAGNOSIS — E669 Obesity, unspecified: Secondary | ICD-10-CM | POA: Insufficient documentation

## 2011-08-11 DIAGNOSIS — IMO0001 Reserved for inherently not codable concepts without codable children: Secondary | ICD-10-CM | POA: Insufficient documentation

## 2011-08-11 DIAGNOSIS — R1110 Vomiting, unspecified: Secondary | ICD-10-CM | POA: Insufficient documentation

## 2011-08-11 DIAGNOSIS — R509 Fever, unspecified: Secondary | ICD-10-CM | POA: Insufficient documentation

## 2011-08-11 DIAGNOSIS — F319 Bipolar disorder, unspecified: Secondary | ICD-10-CM | POA: Insufficient documentation

## 2011-08-11 LAB — LIPASE, BLOOD: Lipase: 23 U/L (ref 11–59)

## 2011-08-11 LAB — COMPREHENSIVE METABOLIC PANEL
BUN: 13 mg/dL (ref 6–23)
Calcium: 10.5 mg/dL (ref 8.4–10.5)
Creatinine, Ser: 0.86 mg/dL (ref 0.50–1.10)
GFR calc Af Amer: 88 mL/min — ABNORMAL LOW (ref >90)
GFR calc non Af Amer: 76 mL/min — ABNORMAL LOW (ref >90)
Glucose, Bld: 97 mg/dL (ref 70–99)
Sodium: 139 mEq/L (ref 135–145)
Total Protein: 8.1 g/dL (ref 6.0–8.3)

## 2011-08-11 LAB — DIFFERENTIAL
Eosinophils Absolute: 0.3 10*3/uL (ref 0.0–0.7)
Eosinophils Relative: 4 % (ref 0–5)
Lymphs Abs: 1.7 10*3/uL (ref 0.7–4.0)
Monocytes Absolute: 0.7 10*3/uL (ref 0.1–1.0)
Monocytes Relative: 10 % (ref 3–12)

## 2011-08-11 LAB — CBC
HCT: 36.9 % (ref 36.0–46.0)
MCH: 30 pg (ref 26.0–34.0)
MCV: 86.6 fL (ref 78.0–100.0)
Platelets: 161 10*3/uL (ref 150–400)
RBC: 4.26 MIL/uL (ref 3.87–5.11)

## 2011-08-11 MED ORDER — DEXTROSE 5 % IV SOLN
500.0000 mg | Freq: Once | INTRAVENOUS | Status: AC
  Administered 2011-08-11: 500 mg via INTRAVENOUS
  Filled 2011-08-11: qty 500

## 2011-08-11 MED ORDER — SODIUM CHLORIDE 0.9 % IV BOLUS (SEPSIS)
1000.0000 mL | Freq: Once | INTRAVENOUS | Status: AC
  Administered 2011-08-11: 1000 mL via INTRAVENOUS

## 2011-08-11 MED ORDER — KETOROLAC TROMETHAMINE 30 MG/ML IJ SOLN
30.0000 mg | Freq: Once | INTRAMUSCULAR | Status: AC
  Administered 2011-08-11: 30 mg via INTRAVENOUS
  Filled 2011-08-11: qty 1

## 2011-08-11 MED ORDER — PREDNISONE 10 MG PO TABS: 50.0000 mg | ORAL_TABLET | Freq: Every day | ORAL | Status: AC

## 2011-08-11 MED ORDER — PREDNISONE 20 MG PO TABS
60.0000 mg | ORAL_TABLET | Freq: Once | ORAL | Status: AC
  Administered 2011-08-11: 60 mg via ORAL
  Filled 2011-08-11: qty 3

## 2011-08-11 MED ORDER — ALBUTEROL SULFATE (5 MG/ML) 0.5% IN NEBU
2.5000 mg | INHALATION_SOLUTION | RESPIRATORY_TRACT | Status: AC
  Administered 2011-08-11: 2.5 mg via RESPIRATORY_TRACT
  Filled 2011-08-11: qty 0.5

## 2011-08-11 MED ORDER — ACETAMINOPHEN 325 MG PO TABS
650.0000 mg | ORAL_TABLET | Freq: Once | ORAL | Status: AC
  Administered 2011-08-11: 650 mg via ORAL
  Filled 2011-08-11: qty 2

## 2011-08-11 MED ORDER — AZITHROMYCIN 250 MG PO TABS: 250.0000 mg | ORAL_TABLET | Freq: Every day | ORAL | Status: AC

## 2011-08-11 MED ORDER — ALBUTEROL SULFATE (5 MG/ML) 0.5% IN NEBU
2.5000 mg | INHALATION_SOLUTION | Freq: Once | RESPIRATORY_TRACT | Status: AC
  Administered 2011-08-11: 2.5 mg via RESPIRATORY_TRACT
  Filled 2011-08-11: qty 0.5

## 2011-08-11 MED ORDER — ALBUTEROL SULFATE HFA 108 (90 BASE) MCG/ACT IN AERS
1.0000 | INHALATION_SPRAY | RESPIRATORY_TRACT | Status: DC | PRN
  Administered 2011-08-12: 2 via RESPIRATORY_TRACT
  Filled 2011-08-11: qty 6.7

## 2011-08-11 NOTE — Discharge Instructions (Signed)

## 2011-08-11 NOTE — ED Notes (Signed)
Patient states "I think I need another breathing treatment." Lung sounds wheezing all 4 lobes expiratory and inspiratory. Chest expansion symmetrical. Breathing nonlabored. Paged respiratory for breathing treatment.

## 2011-08-11 NOTE — ED Provider Notes (Signed)
This chart was scribed for Gerhard Munch, MD by Wallis Mart. The patient was seen in room APA01/APA01 and the patient's care was started at 9:22 PM.   CSN: [id number]  Arrival date & time 08/11/11  2034   First MD Initiated Contact with Patient 08/11/11 2105      Chief Complaint  Patient presents with  . Fever    (Consider location/radiation/quality/duration/timing/severity/associated sxs/prior treatment) HPI Ashley Patrick is a 56 y.o. female who presents to the Emergency Department complaining of sudden onset, persistence of constant, gradually worsening, moderate cold sx onset 3 days ago.  Pt c/o cough, body aches, fever (current temp = 101.6), vomiting, itchy eyes, mild diarrhea, chills.  Pt denies LOC, SOB, swelling. Pt took mucinex w/ no relief of sx. Pt has not seen PCP for sx. There are no other associated symptoms and no other alleviating or aggravating factors.    Past Medical History  Diagnosis Date  . Back pain   . Lateral epicondylitis  of elbow   . Obesity   . Nicotine addiction   . Migraine headache   . Psychosis   . Hypertension   . OD (overdose of drug)     hospitalised for 11 days last month   . Suicidal ideation     hospialised pt. slashed her wrists   . Bipolar 1 disorder     Past Surgical History  Procedure Date  . Partial hysterectomy     No family history on file.  History  Substance Use Topics  . Smoking status: Current Everyday Smoker  . Smokeless tobacco: Not on file  . Alcohol Use: No    OB History    Grav Para Term Preterm Abortions TAB SAB Ect Mult Living                  Review of Systems  Constitutional: Positive for fever and chills.  HENT: Positive for congestion and rhinorrhea.   Eyes: Positive for itching. Negative for pain.  Respiratory: Negative for cough and shortness of breath.   Cardiovascular: Negative for chest pain and palpitations.  Gastrointestinal: Positive for nausea, vomiting and diarrhea. Negative for  abdominal pain.  Genitourinary: Negative for dysuria, urgency and frequency.  Musculoskeletal: Positive for myalgias. Negative for back pain.  Skin: Negative for rash.  Neurological: Positive for headaches. Negative for weakness.    Allergies  Review of patient's allergies indicates no known allergies.  Home Medications   Current Outpatient Rx  Name Route Sig Dispense Refill  . CLONAZEPAM 1 MG PO TABS Oral Take 1 mg by mouth 3 (three) times daily as needed. For anxiety    . DIVALPROEX SODIUM ER 500 MG PO TB24 Oral Take 500 mg by mouth 3 (three) times daily.     . DULOXETINE HCL 30 MG PO CPEP Oral Take 30 mg by mouth daily.    Marland Kitchen HYDROCHLOROTHIAZIDE 25 MG PO TABS Oral Take 25 mg by mouth daily.     . QUETIAPINE FUMARATE 100 MG PO TABS Oral Take 100 mg by mouth 2 (two) times daily.    . THIOTHIXENE 2 MG PO CAPS Oral Take 4 mg by mouth at bedtime.      . TRAZODONE HCL 100 MG PO TABS Oral Take 100 mg by mouth at bedtime.        BP 155/114  Pulse 135  Temp(Src) 101.6 F (38.7 C) (Oral)  Resp 24  SpO2 97%  Physical Exam  Nursing note and vitals reviewed. Constitutional: She is  oriented to person, place, and time. She appears well-developed and well-nourished. No distress.  HENT:  Head: Normocephalic and atraumatic.  Eyes: EOM are normal. Pupils are equal, round, and reactive to light.  Neck: Normal range of motion. Neck supple. No tracheal deviation present.  Cardiovascular: Regular rhythm and normal heart sounds.        Slightly tachycardic  Pulmonary/Chest: Effort normal. No respiratory distress. She has wheezes.  Abdominal: Soft. She exhibits no distension.  Musculoskeletal: Normal range of motion. She exhibits no edema.       Diffused soreness  Neurological: She is alert and oriented to person, place, and time. No sensory deficit.  Skin: Skin is warm and dry.  Psychiatric: She has a normal mood and affect. Her behavior is normal.    ED Course  Procedures (including  critical care time) DIAGNOSTIC STUDIES: Oxygen Saturation is 97% on room air, normal by my interpretation.   Cardiac monitor 110 sinus tach abnormal   Date: 08/11/2011  Rate: 114  Rhythm: sinus tachycardia  QRS Axis: left  Intervals: normal  ST/T Wave abnormalities: nonspecific T wave changes  Conduction Disutrbances:none  Narrative Interpretation:   Old EKG Reviewed: changes noted  ABNORMAL ECG  COORDINATION OF CARE:  9:18: Pt evaluated, physical exam complete  Labs Reviewed  COMPREHENSIVE METABOLIC PANEL - Abnormal; Notable for the following:    Total Bilirubin 0.2 (*)    GFR calc non Af Amer 76 (*)    GFR calc Af Amer 88 (*)    All other components within normal limits  CBC  DIFFERENTIAL  LIPASE, BLOOD  URINALYSIS, ROUTINE W REFLEX MICROSCOPIC   Dg Chest 2 View  08/11/2011  *RADIOLOGY REPORT*  Clinical Data: Discomfort, chest pain.  CHEST - 2 VIEW  Comparison: 06/04/2011  Findings: Heart and mediastinal contours are within normal limits. No focal opacities or effusions.  No acute bony abnormality.  IMPRESSION: No active cardiopulmonary disease.  Original Report Authenticated By: Cyndie Chime, M.D.   X-ray reviewed by me  No diagnosis found.    MDM  I personally performed the services described in this documentation, which was scribed in my presence. The recorded information has been reviewed and considered.  This 56 year old female presents with several days of generalized complaints, on initial exam she is tachycardic, febrile.  Patient also has wheezing bilaterally.  Following initial intervention of albuterol, the patient had a clear her lung sounds.  Following fluids and Toradol the patient noted substantial improvement in her overall condition.  The patient's fever improved, her tachycardia resolved, and she was much more comfortable appearing.  Given his resolution, I patient's history of smoking, for which she was counseled to quit, her presentation is most  consistent with acute bronchitis.  She was discharged with antibiotics, steroids, inhalers.       Gerhard Munch, MD 08/11/11 [phone]

## 2011-08-11 NOTE — ED Notes (Signed)
Cough, body aches, nausea and vomiting x 3 days

## 2011-09-05 ENCOUNTER — Other Ambulatory Visit (HOSPITAL_COMMUNITY): Payer: Self-pay | Admitting: Family Medicine

## 2011-09-05 DIAGNOSIS — Z139 Encounter for screening, unspecified: Secondary | ICD-10-CM

## 2011-09-14 ENCOUNTER — Ambulatory Visit (HOSPITAL_COMMUNITY)
Admission: RE | Admit: 2011-09-14 | Discharge: 2011-09-14 | Disposition: A | Discharge status: Home / Self Care | Payer: Medicare Other | Source: Ambulatory Visit | Attending: Family Medicine | Admitting: Family Medicine

## 2011-09-14 ENCOUNTER — Other Ambulatory Visit (HOSPITAL_COMMUNITY): Payer: Self-pay | Admitting: Family Medicine

## 2011-09-14 DIAGNOSIS — M545 Low back pain, unspecified: Secondary | ICD-10-CM | POA: Insufficient documentation

## 2011-09-14 DIAGNOSIS — Z1231 Encounter for screening mammogram for malignant neoplasm of breast: Secondary | ICD-10-CM | POA: Insufficient documentation

## 2011-09-14 DIAGNOSIS — R52 Pain, unspecified: Secondary | ICD-10-CM

## 2011-09-14 DIAGNOSIS — Z139 Encounter for screening, unspecified: Secondary | ICD-10-CM

## 2011-09-18 ENCOUNTER — Other Ambulatory Visit: Payer: Self-pay | Admitting: Family Medicine

## 2011-09-18 DIAGNOSIS — R928 Other abnormal and inconclusive findings on diagnostic imaging of breast: Secondary | ICD-10-CM

## 2011-09-27 ENCOUNTER — Other Ambulatory Visit (HOSPITAL_COMMUNITY): Payer: Self-pay | Admitting: Family Medicine

## 2011-09-27 ENCOUNTER — Other Ambulatory Visit: Payer: Self-pay | Admitting: Family Medicine

## 2011-09-27 ENCOUNTER — Ambulatory Visit (HOSPITAL_COMMUNITY)
Admission: RE | Admit: 2011-09-27 | Discharge: 2011-09-27 | Disposition: A | Discharge status: Home / Self Care | Payer: Medicare Other | Source: Ambulatory Visit | Attending: Family Medicine | Admitting: Family Medicine

## 2011-09-27 DIAGNOSIS — R928 Other abnormal and inconclusive findings on diagnostic imaging of breast: Secondary | ICD-10-CM

## 2011-09-27 DIAGNOSIS — N63 Unspecified lump in unspecified breast: Secondary | ICD-10-CM | POA: Insufficient documentation

## 2011-09-27 DIAGNOSIS — N6320 Unspecified lump in the left breast, unspecified quadrant: Secondary | ICD-10-CM

## 2011-10-04 ENCOUNTER — Ambulatory Visit (HOSPITAL_COMMUNITY): Payer: Medicare Other

## 2011-10-04 ENCOUNTER — Other Ambulatory Visit (HOSPITAL_COMMUNITY): Payer: Self-pay | Admitting: Family Medicine

## 2011-10-04 ENCOUNTER — Ambulatory Visit (HOSPITAL_COMMUNITY)
Admission: RE | Admit: 2011-10-04 | Discharge: 2011-10-04 | Disposition: A | Discharge status: Home / Self Care | Payer: Medicare Other | Source: Ambulatory Visit | Attending: Family Medicine | Admitting: Family Medicine

## 2011-10-04 VITALS — BP 121/85 | HR 89 | Resp 18

## 2011-10-04 DIAGNOSIS — N6320 Unspecified lump in the left breast, unspecified quadrant: Secondary | ICD-10-CM

## 2011-10-04 DIAGNOSIS — N63 Unspecified lump in unspecified breast: Secondary | ICD-10-CM

## 2011-10-04 DIAGNOSIS — D249 Benign neoplasm of unspecified breast: Secondary | ICD-10-CM | POA: Insufficient documentation

## 2011-10-04 NOTE — Discharge Instructions (Signed)
Breast Biopsy WHY YOU NEED A BIOPSY Your caregiver has recommended that you have a breast tissue sample taken (biopsy). This is done to be certain that the lump or abnormality found in your breast is not cancerous (malignant). During a biopsy, a small piece of tissue is removed, so it can be examined under a microscope by a specialist (pathologist) who looks at tissues and cells and diagnoses abnormalities in them. Most lumps (tumors) or abnormalities, on or in the breast, are not cancerous (benign). However, biopsies are taken when your caregiver cannot be absolutely certain of what is wrong only from doing a physical exam, mammogram (breast X-ray), or other studies. A breast biopsy can tell you whether nothing more needs to be done, or you need more surgery or another type of treatment. A biopsy is done when there is:  Any undiagnosed breast mass.   Nipple abnormalities, dimpling, crusting, or ulcerations.   Calcium deposits (calcifications) or abnormalities seen on your mammogram, ultrasound, or MRI.   Suspicious changes in the breast (thickening, asymmetry) seen on mammogram.   Abnormal discharge from the nipple, especially blood.   Redness, swelling, and pain of the breast.  HOW A BIOPSY IS PERFORMED A biopsy is often performed on an outpatient basis (you go home the same day). This can be done in a hospital, clinic, or surgical center. Tissue samples (biopsies) are often done under local anesthesia (area is numbed). Sometimes general anesthetics are required, in which case you sleep through the procedure. Biopsies may remove the entire lump, a small piece of the lump, or a small sliver of tissue removed by needle. TYPES OF BREAST BIOPSY  Fine needle aspiration. A thin needle is placed through the skin, to the lump or cyst, and cells are removed.   Core needle biopsy. A large needle with a special tip is placed through the skin, to the abnormality, and a piece of tissue is removed.    Stereotactic biopsy. A core needle with a special X-ray is used, to direct the needle to the lump or abnormal area, which is difficult to feel or cannot be felt.   Vacuum-assisted biopsy. A hollow probe and a gentle vacuum remove a sample of tissue.   Ultrasound guided core needle biopsy. You lie on your stomach, with your breast through an opening, and a high frequency ultrasound helps guide the needle to the area of the abnormality.   Open biopsy. An incision is made in the breast, and a piece of the lump or the whole lump is removed.  LET YOUR CAREGIVER KNOW ABOUT:  Allergies.   Medicines taken, including herbs, eye drops, over-the-counter medicines, and creams.   Use of steroids (by mouth or creams).   Previous problems with anesthetics or Novocaine.   If you are taking aspirin or blood thinners.   Possibility of pregnancy, if this applies.   History of blood clots (thrombophlebitis).   History of bleeding or blood problems.   Previous surgery.   Other health problems.  RISKS AND COMPLICATIONS   Bleeding.   Infection.   Allergy to medicines.   Bruising and swelling of the breast.   Alteration in the shape of the breast.   Not finding the lump or abnormality.   Needing more surgery.  BEFORE THE PROCEDURE  You should arrive 60 minutes prior to your procedure or as directed.   Check-in at the admissions desk, to fill out necessary forms, if you are not preregistered.   There will be consent forms   to sign, prior to the procedure.   There is a waiting area for your family, while you are having your biopsy.   Try to have someone with you, to drive you home.   Do not smoke for 2 weeks before the surgery.   Let your caregiver know if you develop a cold or an infection.   Do not drink alcohol for at least 24 hours before surgery.   Wear a good support bra to the surgery.  AFTER THE PROCEDURE  After surgery, you will be taken to the recovery area, where a  nurse will watch and check your progress. Once you are awake, stable, and taking fluids well, if there are no other problems, you will be allowed to go home.   Ice packs applied to your operative site may help with discomfort and keep the swelling down.   You may resume normal diet and activities as directed. Avoid strenuous activities affecting the arm on the side of the biopsy, such as tennis, swimming, heavy lifting (more than 10 pounds) or pulling.   Bruising in the breast is normal following this procedure.   Wearing a support bra, even to bed, may be more comfortable. The bra will also help keep the dressing on.   Change dressings as directed.   Your doctor may apply a pressure dressing on your breast for 24 to 48 hours.   Only take over-the-counter or prescription medicines for pain, discomfort, or fever as directed by your caregiver.   Do not take aspirin, because it can cause bleeding.  HOME CARE INSTRUCTIONS   You may resume your usual diet.   Have someone drive you home after the surgery.   Do not do any exercise, driving, lifting or general activities without your caregiver's permission.   Take medicines and over-the-counter medicines, as ordered by your caregiver.   Keep your postoperative appointments as recommended.   Do not drink alcohol while taking pain medicine.  Finding out the results of your test Not all test results are available during your visit. If your test results are not back during the visit, make an appointment with your caregiver to find out the results. Do not assume everything is normal if you have not heard from your caregiver or the medical facility. It is important for you to follow up on all of your test results.  SEEK MEDICAL CARE IF:   You notice redness, swelling, or increasing pain in the wound.   You notice a bad smell coming from the wound or dressing.   You develop a rash.   You need stronger pain medicine.   You are having an  allergic reaction or problems with your medicines.  SEEK IMMEDIATE MEDICAL CARE IF:   You have difficulty breathing.   You have a fever.   There is increased bleeding (more than a small spot) from the wound.   Pus is coming from the wound.   The wound is breaking open.  Document Released: 06/05/2005 Document Revised: 05/25/2011 Document Reviewed: 04/23/2009 Exit[employer] Patient Information 2012 Exit[employer], LLCBreast Biopsy WHY YOU NEED A BIOPSY Your caregiver has recommended that you have a breast tissue sample taken (biopsy). This is done to be certain that the lump or abnormality found in your breast is not cancerous (malignant). During a biopsy, a small piece of tissue is removed, so it can be examined under a microscope by a specialist (pathologist) who looks at tissues and cells and diagnoses abnormalities in them. Most lumps (tumors) or   abnormalities, on or in the breast, are not cancerous (benign). However, biopsies are taken when your caregiver cannot be absolutely certain of what is wrong only from doing a physical exam, mammogram (breast X-ray), or other studies. A breast biopsy can tell you whether nothing more needs to be done, or you need more surgery or another type of treatment. A biopsy is done when there is:  Any undiagnosed breast mass.   Nipple abnormalities, dimpling, crusting, or ulcerations.   Calcium deposits (calcifications) or abnormalities seen on your mammogram, ultrasound, or MRI.   Suspicious changes in the breast (thickening, asymmetry) seen on mammogram.   Abnormal discharge from the nipple, especially blood.   Redness, swelling, and pain of the breast.  HOW A BIOPSY IS PERFORMED A biopsy is often performed on an outpatient basis (you go home the same day). This can be done in a hospital, clinic, or surgical center. Tissue samples (biopsies) are often done under local anesthesia (area is numbed). Sometimes general anesthetics are required, in which case you  sleep through the procedure. Biopsies may remove the entire lump, a small piece of the lump, or a small sliver of tissue removed by needle. TYPES OF BREAST BIOPSY  Fine needle aspiration. A thin needle is placed through the skin, to the lump or cyst, and cells are removed.   Core needle biopsy. A large needle with a special tip is placed through the skin, to the abnormality, and a piece of tissue is removed.   Stereotactic biopsy. A core needle with a special X-ray is used, to direct the needle to the lump or abnormal area, which is difficult to feel or cannot be felt.   Vacuum-assisted biopsy. A hollow probe and a gentle vacuum remove a sample of tissue.   Ultrasound guided core needle biopsy. You lie on your stomach, with your breast through an opening, and a high frequency ultrasound helps guide the needle to the area of the abnormality.   Open biopsy. An incision is made in the breast, and a piece of the lump or the whole lump is removed.  LET YOUR CAREGIVER KNOW ABOUT:  Allergies.   Medicines taken, including herbs, eye drops, over-the-counter medicines, and creams.   Use of steroids (by mouth or creams).   Previous problems with anesthetics or Novocaine.   If you are taking aspirin or blood thinners.   Possibility of pregnancy, if this applies.   History of blood clots (thrombophlebitis).   History of bleeding or blood problems.   Previous surgery.   Other health problems.  RISKS AND COMPLICATIONS   Bleeding.   Infection.   Allergy to medicines.   Bruising and swelling of the breast.   Alteration in the shape of the breast.   Not finding the lump or abnormality.   Needing more surgery.  BEFORE THE PROCEDURE  You should arrive 60 minutes prior to your procedure or as directed.   Check-in at the admissions desk, to fill out necessary forms, if you are not preregistered.   There will be consent forms to sign, prior to the procedure.   There is a waiting  area for your family, while you are having your biopsy.   Try to have someone with you, to drive you home.   Do not smoke for 2 weeks before the surgery.   Let your caregiver know if you develop a cold or an infection.   Do not drink alcohol for at least 24 hours before surgery.   Wear   a good support bra to the surgery.  AFTER THE PROCEDURE  After surgery, you will be taken to the recovery area, where a nurse will watch and check your progress. Once you are awake, stable, and taking fluids well, if there are no other problems, you will be allowed to go home.   Ice packs applied to your operative site may help with discomfort and keep the swelling down.   You may resume normal diet and activities as directed. Avoid strenuous activities affecting the arm on the side of the biopsy, such as tennis, swimming, heavy lifting (more than 10 pounds) or pulling.   Bruising in the breast is normal following this procedure.   Wearing a support bra, even to bed, may be more comfortable. The bra will also help keep the dressing on.   Change dressings as directed.   Your doctor may apply a pressure dressing on your breast for 24 to 48 hours.   Only take over-the-counter or prescription medicines for pain, discomfort, or fever as directed by your caregiver.   Do not take aspirin, because it can cause bleeding.  HOME CARE INSTRUCTIONS   You may resume your usual diet.   Have someone drive you home after the surgery.   Do not do any exercise, driving, lifting or general activities without your caregiver's permission.   Take medicines and over-the-counter medicines, as ordered by your caregiver.   Keep your postoperative appointments as recommended.   Do not drink alcohol while taking pain medicine.  Finding out the results of your test Not all test results are available during your visit. If your test results are not back during the visit, make an appointment with your caregiver to find out  the results. Do not assume everything is normal if you have not heard from your caregiver or the medical facility. It is important for you to follow up on all of your test results.  SEEK MEDICAL CARE IF:   You notice redness, swelling, or increasing pain in the wound.   You notice a bad smell coming from the wound or dressing.   You develop a rash.   You need stronger pain medicine.   You are having an allergic reaction or problems with your medicines.  SEEK IMMEDIATE MEDICAL CARE IF:   You have difficulty breathing.   You have a fever.   There is increased bleeding (more than a small spot) from the wound.   Pus is coming from the wound.   The wound is breaking open.  Document Released: 06/05/2005 Document Revised: 05/25/2011 Document Reviewed: 04/23/2009 Exit[employer] Patient Information 2012 [employer].. 

## 2011-10-04 NOTE — Progress Notes (Signed)
Lidocaine 2%        9mL injected 

## 2011-10-12 ENCOUNTER — Encounter (HOSPITAL_COMMUNITY): Payer: Self-pay | Admitting: Pharmacy Technician

## 2011-10-12 NOTE — H&P (Signed)
  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  No family h/o breast cancer.  No nipple discharge.  Review of Symptoms:  Constitutional:unremarkable Head:unremarkable Eyes:unremarkable Nose/Mouth/Throat:unremarkable Cardiovascular:unremarkable Respiratory:unremarkable Gastrointestinal:unremarkable Genitourinary:unremarkable Musculoskeletal:unremarkable Skin:unremarkable as above Hematolgic/Lymphatic:unremarkable Allergic/Immunologic:unremarkable   Past Medical History:Reviewed   Past Medical History  Psychiatric History:  Anxiety, Depression Allergies: nkda Medications: quetrypine, thiothixene, cymbalta, clonazepan, divalproex   Social History:Reviewed  Social History  Preferred Language: English ([place]) Race:  Black or African American Ethnicity: Not Hispanic / Latino Age: 56 Years 11 Months Marital Status:  L Alcohol:  No Recreational drug(s):  No   Smoking Status: Current every day smoker reviewed on 10/12/2011 Started Date: 06/19/1978 Packs per day: 1.00   Family History:Reviewed   Family History  Is there a family history of:No family h/o breast cancer    Objective Information: General:Well appearing, well nourished in no distress. Head:Atraumatic; no masses; no abnormalities Heart:RRR, no murmur or gallop.  Normal S1, S2.  No S3, S4.  Lungs:CTA bilaterally, no wheezes, rhonchi, rales.  Breathing unlabored. Dominant 2cm wellcircumscribed mass noted in the upper, outer quadrant of  the left breast.  No nipple discharge, dimpling.  Axilla negative for palpable nodes.  Right breast exam unremarkable  Assessment:Left breast mass  Diagnosis &amp; Procedure: DiagnosisCode: 611.72, ProcedureCode: 16109,    Plan:  Scheduled for left breast biopsy on 10/18/11.   Patient Education:Alternative treatments to surgery were discussed with patient (and family).Risks and benefits  of procedure were fully explained to the patient (and family) who gave informed consent. Patient/family questions were addressed.  Follow-up:Pending Surgery

## 2011-10-13 NOTE — Patient Instructions (Addendum)
Breast Biopsy WHY YOU NEED A BIOPSY Your caregiver has recommended that you have a breast tissue sample taken (biopsy). This is done to be certain that the lump or abnormality found in your breast is not cancerous (malignant). During a biopsy, a small piece of tissue is removed, so it can be examined under a microscope by a specialist (pathologist) who looks at tissues and cells and diagnoses abnormalities in them. Most lumps (tumors) or abnormalities, on or in the breast, are not cancerous (benign). However, biopsies are taken when your caregiver cannot be absolutely certain of what is wrong only from doing a physical exam, mammogram (breast X-ray), or other studies. A breast biopsy can tell you whether nothing more needs to be done, or you need more surgery or another type of treatment. A biopsy is done when there is:  Any undiagnosed breast mass.   Nipple abnormalities, dimpling, crusting, or ulcerations.   Calcium deposits (calcifications) or abnormalities seen on your mammogram, ultrasound, or MRI.   Suspicious changes in the breast (thickening, asymmetry) seen on mammogram.   Abnormal discharge from the nipple, especially blood.   Redness, swelling, and pain of the breast.  HOW A BIOPSY IS PERFORMED A biopsy is often performed on an outpatient basis (you go home the same day). This can be done in a hospital, clinic, or surgical center. Tissue samples (biopsies) are often done under local anesthesia (area is numbed). Sometimes general anesthetics are required, in which case you sleep through the procedure. Biopsies may remove the entire lump, a small piece of the lump, or a small sliver of tissue removed by needle. TYPES OF BREAST BIOPSY  Fine needle aspiration. A thin needle is placed through the skin, to the lump or cyst, and cells are removed.   Core needle biopsy. A large needle with a special tip is placed through the skin, to the abnormality, and a piece of tissue is removed.    Stereotactic biopsy. A core needle with a special X-ray is used, to direct the needle to the lump or abnormal area, which is difficult to feel or cannot be felt.   Vacuum-assisted biopsy. A hollow probe and a gentle vacuum remove a sample of tissue.   Ultrasound guided core needle biopsy. You lie on your stomach, with your breast through an opening, and a high frequency ultrasound helps guide the needle to the area of the abnormality.   Open biopsy. An incision is made in the breast, and a piece of the lump or the whole lump is removed.  LET YOUR CAREGIVER KNOW ABOUT:  Allergies.   Medicines taken, including herbs, eye drops, over-the-counter medicines, and creams.   Use of steroids (by mouth or creams).   Previous problems with anesthetics or Novocaine.   If you are taking aspirin or blood thinners.   Possibility of pregnancy, if this applies.   History of blood clots (thrombophlebitis).   History of bleeding or blood problems.   Previous surgery.   Other health problems.  RISKS AND COMPLICATIONS   Bleeding.   Infection.   Allergy to medicines.   Bruising and swelling of the breast.   Alteration in the shape of the breast.   Not finding the lump or abnormality.   Needing more surgery.  BEFORE THE PROCEDURE You should arrive 60 minutes prior to your procedure or as directed. 20 Ashley Patrick  10/13/2011   Your procedure is scheduled on:  10/18/2011  Report to [facility] at  930  AM.  Call this number if you have problems the morning of surgery: [phone]   Remember:   Do not eat food:After Midnight.  May have clear liquids:until Midnight .  Clear liquids include soda, tea, black coffee, apple or grape juice, broth.  Take these medicines the morning of surgery with A SIP OF WATER: lotrel,cogentin,klonopin,depakote,cymbalta,microzide   Do not wear jewelry, make-up or nail polish.  Do not wear lotions, powders, or perfumes. You may wear deodorant.  Do not  shave 48 hours prior to surgery.  Do not bring valuables to the hospital.  Contacts, dentures or bridgework may not be worn into surgery.  Leave suitcase in the car. After surgery it may be brought to your room.  For patients admitted to the hospital, checkout time is 11:00 AM the day of discharge.   Patients discharged the day of surgery will not be allowed to drive home.  Name and phone number of your driver: family Special Instructions: CHG Shower Use Special Wash: 1/2 bottle night before surgery and 1/2 bottle morning of surgery.    Please read over the following fact sheets that you were given: Pain Booklet, MRSA Information, Surgical Site Infection Prevention, Anesthesia Post-op Instructions and Care and Recovery After Surgery   Check-in at the admissions desk, to fill out necessary forms, if you are not preregistered.   There will be consent forms to sign, prior to the procedure.   There is a waiting area for your family, while you are having your biopsy.   Try to have someone with you, to drive you home.   Do not smoke for 2 weeks before the surgery.   Let your caregiver know if you develop a cold or an infection.   Do not drink alcohol for at least 24 hours before surgery.   Wear a good support bra to the surgery.  AFTER THE PROCEDURE  After surgery, you will be taken to the recovery area, where a nurse will watch and check your progress. Once you are awake, stable, and taking fluids well, if there are no other problems, you will be allowed to go home.   Ice packs applied to your operative site may help with discomfort and keep the swelling down.   You may resume normal diet and activities as directed. Avoid strenuous activities affecting the arm on the side of the biopsy, such as tennis, swimming, heavy lifting (more than 10 pounds) or pulling.   Bruising in the breast is normal following this procedure.   Wearing a support bra, even to bed, may be more comfortable. The  bra will also help keep the dressing on.   Change dressings as directed.   Your doctor may apply a pressure dressing on your breast for 24 to 48 hours.   Only take over-the-counter or prescription medicines for pain, discomfort, or fever as directed by your caregiver.   Do not take aspirin, because it can cause bleeding.  HOME CARE INSTRUCTIONS   You may resume your usual diet.   Have someone drive you home after the surgery.   Do not do any exercise, driving, lifting or general activities without your caregiver's permission.   Take medicines and over-the-counter medicines, as ordered by your caregiver.   Keep your postoperative appointments as recommended.   Do not drink alcohol while taking pain medicine.  Finding out the results of your test Not all test results are available during your visit. If your test results are not back during the visit, make an appointment  with your caregiver to find out the results. Do not assume everything is normal if you have not heard from your caregiver or the medical facility. It is important for you to follow up on all of your test results.  SEEK MEDICAL CARE IF:   You notice redness, swelling, or increasing pain in the wound.   You notice a bad smell coming from the wound or dressing.   You develop a rash.   You need stronger pain medicine.   You are having an allergic reaction or problems with your medicines.  SEEK IMMEDIATE MEDICAL CARE IF:   You have difficulty breathing.   You have a fever.   There is increased bleeding (more than a small spot) from the wound.   Pus is coming from the wound.   The wound is breaking open.  Document Released: 06/05/2005 Document Revised: 05/25/2011 Document Reviewed: 04/23/2009 [facility] Patient Information 2012 [place], [place].PATIENT INSTRUCTIONS POST-ANESTHESIA  IMMEDIATELY FOLLOWING SURGERY:  Do not drive or operate machinery for the first twenty four hours after surgery.  Do not make any  important decisions for twenty four hours after surgery or while taking narcotic pain medications or sedatives.  If you develop intractable nausea and vomiting or a severe headache please notify your doctor immediately.  FOLLOW-UP:  Please make an appointment with your surgeon as instructed. You do not need to follow up with anesthesia unless specifically instructed to do so.  WOUND CARE INSTRUCTIONS (if applicable):  Keep a dry clean dressing on the anesthesia/puncture wound site if there is drainage.  Once the wound has quit draining you may leave it open to air.  Generally you should leave the bandage intact for twenty four hours unless there is drainage.  If the epidural site drains for more than 36-48 hours please call the anesthesia department.  QUESTIONS?:  Please feel free to call your physician or the hospital operator if you have any questions, and they will be happy to assist you.     [facility] Anesthesia Department [address] [place] [phone]

## 2011-10-16 ENCOUNTER — Encounter (HOSPITAL_COMMUNITY)
Admission: RE | Admit: 2011-10-16 | Discharge: 2011-10-16 | Disposition: A | Discharge status: Home / Self Care | Payer: Medicare Other | Source: Ambulatory Visit | Attending: General Surgery | Admitting: General Surgery

## 2011-10-16 ENCOUNTER — Encounter (HOSPITAL_COMMUNITY): Payer: Self-pay

## 2011-10-16 HISTORY — DX: Pure hypercholesterolemia, unspecified: E78.00

## 2011-10-16 LAB — BASIC METABOLIC PANEL
CO2: 26 mEq/L (ref 19–32)
Chloride: 104 mEq/L (ref 96–112)
Glucose, Bld: 94 mg/dL (ref 70–99)
Sodium: 140 mEq/L (ref 135–145)

## 2011-10-16 LAB — DIFFERENTIAL
Eosinophils Relative: 4 % (ref 0–5)
Lymphocytes Relative: 50 % — ABNORMAL HIGH (ref 12–46)
Lymphs Abs: 2.5 10*3/uL (ref 0.7–4.0)
Monocytes Absolute: 0.3 10*3/uL (ref 0.1–1.0)
Monocytes Relative: 7 % (ref 3–12)

## 2011-10-16 LAB — CBC
HCT: 37.8 % (ref 36.0–46.0)
MCV: 89.8 fL (ref 78.0–100.0)
RBC: 4.21 MIL/uL (ref 3.87–5.11)
WBC: 5 10*3/uL (ref 4.0–10.5)

## 2011-10-16 NOTE — Progress Notes (Signed)
10/16/11 0808  OBSTRUCTIVE SLEEP APNEA  Score 4 or greater  Updated health history

## 2011-10-18 ENCOUNTER — Encounter (HOSPITAL_COMMUNITY)
Admission: RE | Disposition: A | Discharge status: Home / Self Care | Payer: Self-pay | Source: Ambulatory Visit | Attending: General Surgery

## 2011-10-18 ENCOUNTER — Ambulatory Visit (HOSPITAL_COMMUNITY): Payer: Medicare Other | Admitting: Anesthesiology

## 2011-10-18 ENCOUNTER — Encounter (HOSPITAL_COMMUNITY): Payer: Self-pay | Admitting: *Deleted

## 2011-10-18 ENCOUNTER — Encounter (HOSPITAL_COMMUNITY): Payer: Self-pay | Admitting: Anesthesiology

## 2011-10-18 ENCOUNTER — Ambulatory Visit (HOSPITAL_COMMUNITY)
Admission: RE | Admit: 2011-10-18 | Discharge: 2011-10-18 | Disposition: A | Discharge status: Home / Self Care | Payer: Medicare Other | Source: Ambulatory Visit | Attending: General Surgery | Admitting: General Surgery

## 2011-10-18 DIAGNOSIS — N63 Unspecified lump in unspecified breast: Secondary | ICD-10-CM | POA: Insufficient documentation

## 2011-10-18 DIAGNOSIS — Z01812 Encounter for preprocedural laboratory examination: Secondary | ICD-10-CM | POA: Insufficient documentation

## 2011-10-18 DIAGNOSIS — N6049 Mammary duct ectasia of unspecified breast: Secondary | ICD-10-CM | POA: Insufficient documentation

## 2011-10-18 HISTORY — PX: BREAST BIOPSY: SHX20

## 2011-10-18 SURGERY — BREAST BIOPSY
Anesthesia: General | Site: Breast | Laterality: Left | Wound class: Clean

## 2011-10-18 MED ORDER — DEXAMETHASONE SODIUM PHOSPHATE 4 MG/ML IJ SOLN
INTRAMUSCULAR | Status: AC
  Administered 2011-10-18: 4 mg via INTRAVENOUS
  Filled 2011-10-18: qty 1

## 2011-10-18 MED ORDER — MIDAZOLAM HCL 2 MG/2ML IJ SOLN
1.0000 mg | INTRAMUSCULAR | Status: DC | PRN
Start: 2011-10-18 — End: 2011-10-18
  Administered 2011-10-18: 2 mg via INTRAVENOUS

## 2011-10-18 MED ORDER — ONDANSETRON HCL 4 MG/2ML IJ SOLN
INTRAMUSCULAR | Status: AC
  Administered 2011-10-18: 4 mg via INTRAVENOUS
  Filled 2011-10-18: qty 2

## 2011-10-18 MED ORDER — ENOXAPARIN SODIUM 40 MG/0.4ML ~~LOC~~ SOLN
SUBCUTANEOUS | Status: AC
  Administered 2011-10-18: 40 mg via SUBCUTANEOUS
  Filled 2011-10-18: qty 0.4

## 2011-10-18 MED ORDER — FENTANYL CITRATE 0.05 MG/ML IJ SOLN
INTRAMUSCULAR | Status: DC | PRN
  Administered 2011-10-18: 50 ug via INTRAVENOUS
  Administered 2011-10-18 (×2): 25 ug via INTRAVENOUS

## 2011-10-18 MED ORDER — ONDANSETRON HCL 4 MG/2ML IJ SOLN
4.0000 mg | Freq: Once | INTRAMUSCULAR | Status: AC
  Administered 2011-10-18: 4 mg via INTRAVENOUS

## 2011-10-18 MED ORDER — DEXAMETHASONE SODIUM PHOSPHATE 4 MG/ML IJ SOLN
4.0000 mg | Freq: Once | INTRAMUSCULAR | Status: AC
  Administered 2011-10-18: 4 mg via INTRAVENOUS

## 2011-10-18 MED ORDER — LIDOCAINE HCL 1 % IJ SOLN
INTRAMUSCULAR | Status: DC | PRN
  Administered 2011-10-18: 50 mg via INTRADERMAL

## 2011-10-18 MED ORDER — PROPOFOL 10 MG/ML IV BOLUS
INTRAVENOUS | Status: DC | PRN
  Administered 2011-10-18: 150 mg via INTRAVENOUS

## 2011-10-18 MED ORDER — FENTANYL CITRATE 0.05 MG/ML IJ SOLN
INTRAMUSCULAR | Status: AC
  Filled 2011-10-18: qty 2

## 2011-10-18 MED ORDER — 0.9 % SODIUM CHLORIDE (POUR BTL) OPTIME
TOPICAL | Status: DC | PRN
  Administered 2011-10-18: 1000 mL

## 2011-10-18 MED ORDER — KETOROLAC TROMETHAMINE 30 MG/ML IJ SOLN
30.0000 mg | Freq: Once | INTRAMUSCULAR | Status: AC
  Administered 2011-10-18: 30 mg via INTRAVENOUS

## 2011-10-18 MED ORDER — LACTATED RINGERS IV SOLN
INTRAVENOUS | Status: DC
  Administered 2011-10-18: 1000 mL via INTRAVENOUS

## 2011-10-18 MED ORDER — MIDAZOLAM HCL 2 MG/2ML IJ SOLN
INTRAMUSCULAR | Status: AC
  Filled 2011-10-18: qty 2

## 2011-10-18 MED ORDER — ENOXAPARIN SODIUM 40 MG/0.4ML ~~LOC~~ SOLN
40.0000 mg | Freq: Once | SUBCUTANEOUS | Status: AC
  Administered 2011-10-18: 40 mg via SUBCUTANEOUS

## 2011-10-18 MED ORDER — BUPIVACAINE HCL (PF) 0.5 % IJ SOLN
INTRAMUSCULAR | Status: AC
  Filled 2011-10-18: qty 30

## 2011-10-18 MED ORDER — OXYCODONE-ACETAMINOPHEN 7.5-325 MG PO TABS
1.0000 | ORAL_TABLET | ORAL | Status: AC | PRN
Start: 2011-10-18 — End: 2012-10-17

## 2011-10-18 MED ORDER — MIDAZOLAM HCL 2 MG/2ML IJ SOLN
INTRAMUSCULAR | Status: AC
  Administered 2011-10-18: 2 mg via INTRAVENOUS
  Filled 2011-10-18: qty 2

## 2011-10-18 MED ORDER — ONDANSETRON HCL 4 MG/2ML IJ SOLN: 4.0000 mg | Freq: Once | INTRAMUSCULAR | Status: DC | PRN

## 2011-10-18 MED ORDER — FENTANYL CITRATE 0.05 MG/ML IJ SOLN
25.0000 ug | INTRAMUSCULAR | Status: DC | PRN
  Administered 2011-10-18 (×3): 50 ug via INTRAVENOUS

## 2011-10-18 MED ORDER — FENTANYL CITRATE 0.05 MG/ML IJ SOLN
INTRAMUSCULAR | Status: AC
  Administered 2011-10-18: 50 ug via INTRAVENOUS
  Filled 2011-10-18: qty 2

## 2011-10-18 MED ORDER — MIDAZOLAM HCL 5 MG/5ML IJ SOLN
INTRAMUSCULAR | Status: DC | PRN
  Administered 2011-10-18: 2 mg via INTRAVENOUS

## 2011-10-18 MED ORDER — KETOROLAC TROMETHAMINE 30 MG/ML IJ SOLN
INTRAMUSCULAR | Status: AC
  Administered 2011-10-18: 30 mg via INTRAVENOUS
  Filled 2011-10-18: qty 1

## 2011-10-18 MED ORDER — BUPIVACAINE HCL (PF) 0.5 % IJ SOLN
INTRAMUSCULAR | Status: DC | PRN
  Administered 2011-10-18: 7 mL

## 2011-10-18 SURGICAL SUPPLY — 31 items
BAG HAMPER (MISCELLANEOUS) ×2 IMPLANT
CLOTH BEACON ORANGE TIMEOUT ST (SAFETY) ×2 IMPLANT
COVER SURGICAL LIGHT HANDLE (MISCELLANEOUS) ×4 IMPLANT
DERMABOND ADVANCED (GAUZE/BANDAGES/DRESSINGS) ×1
DERMABOND ADVANCED .7 DNX12 (GAUZE/BANDAGES/DRESSINGS) ×1 IMPLANT
DURAPREP 26ML APPLICATOR (WOUND CARE) ×2 IMPLANT
ELECT REM PT RETURN 9FT ADLT (ELECTROSURGICAL) ×2
ELECTRODE REM PT RTRN 9FT ADLT (ELECTROSURGICAL) ×1 IMPLANT
FORMALIN 10 PREFIL 120ML (MISCELLANEOUS) ×2 IMPLANT
GLOVE BIO SURGEON STRL SZ7.5 (GLOVE) ×2 IMPLANT
GLOVE BIOGEL PI IND STRL 7.0 (GLOVE) ×1 IMPLANT
GLOVE BIOGEL PI INDICATOR 7.0 (GLOVE) ×1
GLOVE ECLIPSE 6.5 STRL STRAW (GLOVE) ×2 IMPLANT
GLOVE EXAM NITRILE MD LF STRL (GLOVE) ×2 IMPLANT
GOWN STRL REIN XL XLG (GOWN DISPOSABLE) ×4 IMPLANT
KIT ROOM TURNOVER APOR (KITS) ×2 IMPLANT
MANIFOLD NEPTUNE II (INSTRUMENTS) ×2 IMPLANT
NEEDLE HYPO 18GX1.5 BLUNT FILL (NEEDLE) ×2 IMPLANT
NEEDLE HYPO 25X1 1.5 SAFETY (NEEDLE) ×2 IMPLANT
NS IRRIG 1000ML POUR BTL (IV SOLUTION) ×2 IMPLANT
PACK MINOR (CUSTOM PROCEDURE TRAY) ×2 IMPLANT
PAD ARMBOARD 7.5X6 YLW CONV (MISCELLANEOUS) ×2 IMPLANT
SET BASIN LINEN APH (SET/KITS/TRAYS/PACK) ×2 IMPLANT
SPONGE GAUZE 2X2 8PLY STRL LF (GAUZE/BANDAGES/DRESSINGS) IMPLANT
STRIP CLOSURE SKIN 1/4X3 (GAUZE/BANDAGES/DRESSINGS) IMPLANT
SUT SILK 3 0 (SUTURE) ×1
SUT SILK 3-0 FS1 18XBRD (SUTURE) ×1 IMPLANT
SUT VIC AB 3-0 SH 27 (SUTURE) ×1
SUT VIC AB 3-0 SH 27X BRD (SUTURE) ×1 IMPLANT
SUT VIC AB 4-0 PS2 27 (SUTURE) ×2 IMPLANT
SYR CONTROL 10ML LL (SYRINGE) ×2 IMPLANT

## 2011-10-18 NOTE — Transfer of Care (Signed)
Immediate Anesthesia Transfer of Care Note  Patient: Ashley Patrick  Procedure(s) Performed: Procedure(s) (LRB): BREAST BIOPSY (Left)  Patient Location: PACU  Anesthesia Type: General  Level of Consciousness: awake and patient cooperative  Airway & Oxygen Therapy: Patient Spontanous Breathing and Patient connected to face mask oxygen  Post-op Assessment: Report given to PACU RN, Post -op Vital signs reviewed and stable and Patient moving all extremities  Post vital signs: Reviewed and stable  Complications: No apparent anesthesia complications

## 2011-10-18 NOTE — Anesthesia Procedure Notes (Signed)
Procedure Name: LMA Insertion Date/Time: 10/18/2011 10:18 AM Performed by: Despina Hidden Pre-anesthesia Checklist: Emergency Drugs available, Suction available, Patient identified and Patient being monitored Patient Re-evaluated:Patient Re-evaluated prior to inductionOxygen Delivery Method: Circle system utilized Preoxygenation: Pre-oxygenation with 100% oxygen Intubation Type: IV induction Ventilation: Mask ventilation without difficulty LMA Size: 3.0 Tube type: Oral Number of attempts: 1 Placement Confirmation: positive ETCO2 and breath sounds checked- equal and bilateral Tube secured with: Tape Dental Injury: Teeth and Oropharynx as per pre-operative assessment

## 2011-10-18 NOTE — Anesthesia Postprocedure Evaluation (Signed)
  Anesthesia Post-op Note  Patient: Ashley Patrick  Procedure(s) Performed: Procedure(s) (LRB): BREAST BIOPSY (Left)  Patient Location: PACU  Anesthesia Type: General  Level of Consciousness: awake, alert , oriented and patient cooperative  Airway and Oxygen Therapy: Patient Spontanous Breathing  Post-op Pain: 2 /10, mild  Post-op Assessment: Post-op Vital signs reviewed, Patient's Cardiovascular Status Stable, Respiratory Function Stable, Patent Airway, No signs of Nausea or vomiting and Pain level controlled  Post-op Vital Signs: Reviewed and stable  Complications: No apparent anesthesia complications

## 2011-10-18 NOTE — Interval H&P Note (Signed)
History and Physical Interval Note:  10/18/2011 8:35 AM  Ashley Patrick  has presented today for surgery, with the diagnosis of Mass of breast  The various methods of treatment have been discussed with the patient and family. After consideration of risks, benefits and other options for treatment, the patient has consented to  Procedure(s) (LRB): BREAST BIOPSY (Left) as a surgical intervention .  The patients' history has been reviewed, patient examined, no change in status, stable for surgery.  I have reviewed the patients' chart and labs.  Questions were answered to the patient's satisfaction.     Franky Macho A

## 2011-10-18 NOTE — Anesthesia Preprocedure Evaluation (Addendum)
Anesthesia Evaluation  Patient identified by MRN, date of birth, ID band Patient awake    Reviewed: Allergy & Precautions, H&P , NPO status , Patient's Chart, lab work & pertinent test results  History of Anesthesia Complications (+) PONV  Airway Mallampati: I      Dental  (+) Teeth Intact   Pulmonary sleep apnea , Current Smoker,  breath sounds clear to auscultation        Cardiovascular hypertension, Pt. on medications Rhythm:Regular Rate:Normal     Neuro/Psych  Headaches, PSYCHIATRIC DISORDERS Depression Bipolar Disorder    GI/Hepatic   Endo/Other    Renal/GU      Musculoskeletal   Abdominal   Peds  Hematology   Anesthesia Other Findings   Reproductive/Obstetrics                          Anesthesia Physical Anesthesia Plan  ASA: II  Anesthesia Plan: General   Post-op Pain Management:    Induction: Intravenous  Airway Management Planned: LMA  Additional Equipment:   Intra-op Plan:   Post-operative Plan: Extubation in OR  Informed Consent: I have reviewed the patients History and Physical, chart, labs and discussed the procedure including the risks, benefits and alternatives for the proposed anesthesia with the patient or authorized representative who has indicated his/her understanding and acceptance.     Plan Discussed with:   Anesthesia Plan Comments:         Anesthesia Quick Evaluation

## 2011-10-18 NOTE — Op Note (Signed)
Patient:  Ashley Patrick  DOB:  18-Jan-1956  MRN:  [id number]   Preop Diagnosis:  Left breast mass  Postop Diagnosis:  Same  Procedure:  Left breast biopsy  Surgeon:  Franky Macho, M.D.  Anes:  General  Indications:  Patient is a 56 year old female who presents with a dominant mass in the upper, outer quadrant of left breast. Risks and benefits of the procedure including bleeding and infection were fully explained to the patient, gave informed consent.  Procedure note:  Patient was placed in supine position. After general anesthesia was administered, the left breast was prepped and draped using the usual sterile technique with DuraPrep. Surgical site confirmation was performed.  Incision was made in the upper, outer quadrant of left breast. This was taken down to the mass. The mass was excised with out difficulty. A short suture was placed superiorly and long suture placed laterally for orientation purposes. Sent to pathology for examination. The bleeding was controlled using Bovie electrocautery. The wound is irrigated normal saline. The skin was closed using a 4-0 Vicryl subcuticular suture. Infection since he was instilled with surrounding wound. Dermabond was then applied.  All tape and needle counts are correct. The procedure. Patient was awakened and transferred to PACU in stable condition.  Complications:  None  EBL:  Minimal  Specimen:  Left breast tissue

## 2011-10-18 NOTE — Discharge Instructions (Signed)
Breast Biopsy  Care After Please read the instructions outlined below and refer to this sheet in the next few weeks. These discharge instructions provide you with general information on caring for yourself after you leave the hospital. Your caregiver may also give you specific instructions. While your treatment has been planned according to the most current medical practices available, unavoidable complications occasionally occur. If you have any problems or questions after discharge, please call your caregiver. HOME CARE INSTRUCTIONS   Only take over-the-counter or prescription medicines for pain, discomfort, or fever as directed by your caregiver.   Do not take any product containing aspirin. It can cause bleeding.   Keep the stitches dry when bathing.   Avoid strenuous activities (stretching, reaching, jogging, lifting over 3 pounds) until your caregiver tells you it is okay.   Resume your usual diet.   Wear a good support bra, for as long as you are instructed.   If you are given an ice pack, apply it as many days as directed.   Change the dressing as advised.   Do not drink alcohol while taking pain medicine.   A small amount of bruising is normal after a biopsy.   Keep all your postoperative appointments.  Finding out the results of your test Ask when your test results will be ready. Make sure you get your test results. SEEK MEDICAL CARE IF:   You notice redness, swelling, or increasing pain in the wound.   You notice a bad smell coming from the wound or dressing.   The wound breaks open after the stitches (sutures), staples, or skin adhesive strips have been removed.   You develop a rash.   You have side effects or an allergic reaction to the medicine.   You need stronger medicine.  SEEK IMMEDIATE MEDICAL CARE IF:   You have a fever.   There is increased bleeding (more than a small spot) from the wound.   You have difficulty breathing.   Pus is coming from the  wound.  Document Released: 12/23/2004 Document Revised: 05/25/2011 Document Reviewed: 04/23/2009 [employer] Patient Information 2012 [employer]. 

## 2011-10-23 ENCOUNTER — Encounter (HOSPITAL_COMMUNITY): Payer: Self-pay | Admitting: General Surgery

## 2012-06-19 DIAGNOSIS — C801 Malignant (primary) neoplasm, unspecified: Secondary | ICD-10-CM

## 2012-06-19 HISTORY — DX: Malignant (primary) neoplasm, unspecified: C80.1

## 2012-09-06 ENCOUNTER — Emergency Department (HOSPITAL_COMMUNITY)
Admission: EM | Admit: 2012-09-06 | Discharge: 2012-09-06 | Disposition: A | Discharge status: Home / Self Care | Payer: Medicare Other | Attending: Emergency Medicine | Admitting: Emergency Medicine

## 2012-09-06 ENCOUNTER — Emergency Department (HOSPITAL_COMMUNITY): Payer: Medicare Other

## 2012-09-06 ENCOUNTER — Encounter (HOSPITAL_COMMUNITY): Payer: Self-pay | Admitting: Emergency Medicine

## 2012-09-06 DIAGNOSIS — E669 Obesity, unspecified: Secondary | ICD-10-CM | POA: Insufficient documentation

## 2012-09-06 DIAGNOSIS — Z79899 Other long term (current) drug therapy: Secondary | ICD-10-CM | POA: Insufficient documentation

## 2012-09-06 DIAGNOSIS — Z8739 Personal history of other diseases of the musculoskeletal system and connective tissue: Secondary | ICD-10-CM | POA: Insufficient documentation

## 2012-09-06 DIAGNOSIS — I10 Essential (primary) hypertension: Secondary | ICD-10-CM | POA: Insufficient documentation

## 2012-09-06 DIAGNOSIS — Z8679 Personal history of other diseases of the circulatory system: Secondary | ICD-10-CM | POA: Insufficient documentation

## 2012-09-06 DIAGNOSIS — J209 Acute bronchitis, unspecified: Secondary | ICD-10-CM | POA: Insufficient documentation

## 2012-09-06 DIAGNOSIS — J40 Bronchitis, not specified as acute or chronic: Secondary | ICD-10-CM

## 2012-09-06 DIAGNOSIS — E7800 Pure hypercholesterolemia, unspecified: Secondary | ICD-10-CM | POA: Insufficient documentation

## 2012-09-06 DIAGNOSIS — F17200 Nicotine dependence, unspecified, uncomplicated: Secondary | ICD-10-CM | POA: Insufficient documentation

## 2012-09-06 DIAGNOSIS — F319 Bipolar disorder, unspecified: Secondary | ICD-10-CM | POA: Insufficient documentation

## 2012-09-06 MED ORDER — GUAIFENESIN-CODEINE 100-10 MG/5ML PO SOLN
5.0000 mL | Freq: Once | ORAL | Status: AC
  Administered 2012-09-06: 5 mL via ORAL
  Filled 2012-09-06: qty 5

## 2012-09-06 MED ORDER — ALBUTEROL SULFATE HFA 108 (90 BASE) MCG/ACT IN AERS: 1.0000 | INHALATION_SPRAY | Freq: Four times a day (QID) | RESPIRATORY_TRACT | Status: DC | PRN

## 2012-09-06 MED ORDER — DEXTROMETHORPHAN HBR 15 MG/5ML PO SYRP: 10.0000 mL | ORAL_SOLUTION | Freq: Four times a day (QID) | ORAL | Status: DC | PRN

## 2012-09-06 MED ORDER — PREDNISONE 10 MG PO TABS: 20.0000 mg | ORAL_TABLET | Freq: Every day | ORAL | Status: DC

## 2012-09-06 MED ORDER — PREDNISONE 50 MG PO TABS
60.0000 mg | ORAL_TABLET | Freq: Once | ORAL | Status: AC
  Administered 2012-09-06: 60 mg via ORAL
  Filled 2012-09-06: qty 1

## 2012-09-06 MED ORDER — ALBUTEROL SULFATE (5 MG/ML) 0.5% IN NEBU
2.5000 mg | INHALATION_SOLUTION | Freq: Once | RESPIRATORY_TRACT | Status: AC
  Administered 2012-09-06: 2.5 mg via RESPIRATORY_TRACT
  Filled 2012-09-06: qty 0.5

## 2012-09-06 NOTE — ED Notes (Signed)
Pt c/o cough x one month.

## 2012-09-06 NOTE — ED Provider Notes (Signed)
History     CSN: [id number]  Arrival date & time 09/06/12  0010   First MD Initiated Contact with Patient 09/06/12 0022      Chief Complaint  Patient presents with  . Cough    (Consider location/radiation/quality/duration/timing/severity/associated sxs/prior treatment) HPI Ashley Patrick is a 57 y.o. female who presents to the Emergency Department complaining of cough x one month that has now gotten worse. She has had vomiting with the cough. Denies fever, chills. Has shortness of breath, coughing, vomiting, chest discomfort with the cough, wheezing.  PCP Dr. Parke Simmers  Past Medical History  Diagnosis Date  . Back pain   . Obesity   . Nicotine addiction   . Psychosis   . Hypertension   . OD (overdose of drug)     hospitalized for 11 days   . Bipolar 1 disorder   . Hypercholesteremia   . Migraine headache   . Lateral epicondylitis  of elbow     right  . Sleep apnea     STOP BANG score: 4    Past Surgical History  Procedure Laterality Date  . Partial hysterectomy    . Breast biopsy  10/18/2011    Procedure: BREAST BIOPSY;  Surgeon: Dalia Heading, MD;  Location: [facility];  Service: General;  Laterality: Left;    Family History  Problem Relation Age of Onset  . Anesthesia problems Neg Hx   . Hypotension Neg Hx   . Malignant hyperthermia Neg Hx   . Pseudochol deficiency Neg Hx     History  Substance Use Topics  . Smoking status: Current Every Day Smoker -- 0.50 packs/day for 38 years    Types: Cigarettes  . Smokeless tobacco: Not on file  . Alcohol Use: No    OB History   Grav Para Term Preterm Abortions TAB SAB Ect Mult Living                  Review of Systems  Constitutional: Negative for fever.       10 Systems reviewed and are negative for acute change except as noted in the HPI.  HENT: Negative for congestion.   Eyes: Negative for discharge and redness.  Respiratory: Positive for cough and wheezing. Negative for shortness of breath.   Cardiovascular:  Negative for chest pain.  Gastrointestinal: Positive for vomiting. Negative for abdominal pain.  Musculoskeletal: Negative for back pain.  Skin: Negative for rash.  Neurological: Negative for syncope, numbness and headaches.  Psychiatric/Behavioral:       No behavior change.    Allergies  Review of patient's allergies indicates no known allergies.  Home Medications   Current Outpatient Rx  Name  Route  Sig  Dispense  Refill  . divalproex (DEPAKOTE) 500 MG 24 hr tablet   Oral   Take 500 mg by mouth 3 (three) times daily.          . hydrochlorothiazide (MICROZIDE) 12.5 MG capsule   Oral   Take 12.5 mg by mouth every morning.         . ziprasidone (GEODON) 40 MG capsule   Oral   Take 40 mg by mouth 2 (two) times daily with a meal.         . amLODipine-benazepril (LOTREL) 5-10 MG per capsule   Oral   Take 1 capsule by mouth daily.         . benztropine (COGENTIN) 1 MG tablet   Oral   Take 1 mg by mouth daily.         Marland Kitchen  clonazePAM (KLONOPIN) 1 MG tablet   Oral   Take 1 mg by mouth 3 (three) times daily.          . DULoxetine (CYMBALTA) 30 MG capsule   Oral   Take 30 mg by mouth daily.         Marland Kitchen oxyCODONE-acetaminophen (PERCOCET) 7.5-325 MG per tablet   Oral   Take 1-2 tablets by mouth every 4 (four) hours as needed for pain.   40 tablet   0   . QUEtiapine (SEROQUEL) 100 MG tablet   Oral   Take 100-200 mg by mouth at bedtime. May take up too 2 tablets         . thiothixene (NAVANE) 2 MG capsule   Oral   Take 2-4 mg by mouth at bedtime. May take up too 2 capsules           BP 162/87  Pulse 104  Temp(Src) 98.1 F (36.7 C) (Oral)  Resp 18  Ht 5\' 3"  (1.6 m)  Wt 180 lb (81.647 kg)  BMI 31.89 kg/m2  SpO2 93%  Physical Exam  Nursing note and vitals reviewed. Constitutional: She appears well-developed and well-nourished.  Awake, alert, nontoxic appearance.  HENT:  Head: Normocephalic and atraumatic.  Right Ear: External ear normal.   Left Ear: External ear normal.  Nose: Nose normal.  Mouth/Throat: Oropharynx is clear and moist.  Eyes: EOM are normal. Pupils are equal, round, and reactive to light. Right eye exhibits no discharge. Left eye exhibits no discharge.  Neck: Normal range of motion. Neck supple.  Cardiovascular: Normal rate and intact distal pulses.   Pulmonary/Chest: Effort normal. She has wheezes. She exhibits no tenderness.  coughing  Abdominal: Soft. There is no tenderness. There is no rebound.  Musculoskeletal: She exhibits no tenderness.  Baseline ROM, no obvious new focal weakness.  Neurological:  Mental status and motor strength appears baseline for patient and situation.  Skin: No rash noted.  Psychiatric: She has a normal mood and affect.    ED Course  Procedures (including critical care time)  Dg Chest 2 View  09/06/2012  *RADIOLOGY REPORT*  Clinical Data: 21-month history of cough.  Long-time smoker.  CHEST - 2 VIEW  Comparison: Two-view chest x-ray 08/11/2011, 04/04/2010.  Findings: Cardiac silhouette normal in size, unchanged.  Thoracic aorta mildly tortuous and atherosclerotic, unchanged.  Hilar and mediastinal contours otherwise unremarkable.  Mildly prominent bronchovascular markings diffusely and mild central peribronchial thickening, unchanged.  Lungs otherwise clear.    Pulmonary vascularity normal.  No pneumothorax.  No pleural effusions. Mild degenerative changes involving the thoracic spine.  No significant interval change.  IMPRESSION: Stable mild changes of chronic bronchitis and/or asthma.  No acute cardiopulmonary disease.   Original Report Authenticated By: Hulan Saas, M.D.     [phone] Patient has had robitussin ac and albuterol inhaler with some relief. Not coughing as much. Chest xray without acute findings.  MDM  Patient with coughing, no fever, chills. Chest xray without acute findings. Given albuterol, prednisone, and robitussin ac with some improvement. Reviewed results  with patient.  Pt feels improved after observation and/or treatment in ED.Pt stable in ED with no significant deterioration in condition.The patient appears reasonably screened and/or stabilized for discharge and I doubt any other medical condition or other [facility] requiring further screening, evaluation, or treatment in the ED at this time prior to discharge.  MDM Reviewed: nursing note and vitals Interpretation: x-ray           Aurther Loft  Velia Meyer, MD 09/06/12 [phone]

## 2012-10-29 ENCOUNTER — Other Ambulatory Visit (HOSPITAL_COMMUNITY): Payer: Self-pay | Admitting: Family Medicine

## 2012-10-29 DIAGNOSIS — Z139 Encounter for screening, unspecified: Secondary | ICD-10-CM

## 2012-11-05 ENCOUNTER — Ambulatory Visit (HOSPITAL_COMMUNITY)
Admission: RE | Admit: 2012-11-05 | Discharge: 2012-11-05 | Disposition: A | Discharge status: Home / Self Care | Payer: Medicare Other | Source: Ambulatory Visit | Attending: Family Medicine | Admitting: Family Medicine

## 2012-11-05 DIAGNOSIS — Z139 Encounter for screening, unspecified: Secondary | ICD-10-CM

## 2012-11-05 DIAGNOSIS — Z1231 Encounter for screening mammogram for malignant neoplasm of breast: Secondary | ICD-10-CM | POA: Insufficient documentation

## 2012-11-12 ENCOUNTER — Other Ambulatory Visit: Payer: Self-pay | Admitting: Family Medicine

## 2012-11-12 DIAGNOSIS — R928 Other abnormal and inconclusive findings on diagnostic imaging of breast: Secondary | ICD-10-CM

## 2012-11-20 ENCOUNTER — Encounter (HOSPITAL_COMMUNITY): Payer: Medicare Other

## 2012-11-27 ENCOUNTER — Ambulatory Visit (HOSPITAL_COMMUNITY)
Admission: RE | Admit: 2012-11-27 | Discharge: 2012-11-27 | Disposition: A | Discharge status: Home / Self Care | Payer: Medicare Other | Source: Ambulatory Visit | Attending: Family Medicine | Admitting: Family Medicine

## 2012-11-27 DIAGNOSIS — R928 Other abnormal and inconclusive findings on diagnostic imaging of breast: Secondary | ICD-10-CM

## 2012-11-28 ENCOUNTER — Emergency Department (HOSPITAL_COMMUNITY): Payer: Medicare Other

## 2012-11-28 ENCOUNTER — Encounter (HOSPITAL_COMMUNITY): Payer: Self-pay | Admitting: Emergency Medicine

## 2012-11-28 ENCOUNTER — Observation Stay (HOSPITAL_COMMUNITY)
Admission: EM | Admit: 2012-11-28 | Discharge: 2012-11-29 | Disposition: A | Discharge status: Home / Self Care | Payer: Medicare Other | Attending: Family Medicine | Admitting: Family Medicine

## 2012-11-28 DIAGNOSIS — N83209 Unspecified ovarian cyst, unspecified side: Secondary | ICD-10-CM

## 2012-11-28 DIAGNOSIS — R1115 Cyclical vomiting syndrome unrelated to migraine: Secondary | ICD-10-CM

## 2012-11-28 DIAGNOSIS — R1032 Left lower quadrant pain: Principal | ICD-10-CM | POA: Insufficient documentation

## 2012-11-28 DIAGNOSIS — R112 Nausea with vomiting, unspecified: Secondary | ICD-10-CM | POA: Insufficient documentation

## 2012-11-28 DIAGNOSIS — I10 Essential (primary) hypertension: Secondary | ICD-10-CM

## 2012-11-28 DIAGNOSIS — R1084 Generalized abdominal pain: Secondary | ICD-10-CM

## 2012-11-28 DIAGNOSIS — F319 Bipolar disorder, unspecified: Secondary | ICD-10-CM

## 2012-11-28 DIAGNOSIS — E876 Hypokalemia: Secondary | ICD-10-CM

## 2012-11-28 LAB — COMPREHENSIVE METABOLIC PANEL
ALT: 23 U/L (ref 0–35)
AST: 18 U/L (ref 0–37)
Alkaline Phosphatase: 117 U/L (ref 39–117)
CO2: 25 mEq/L (ref 19–32)
Chloride: 101 mEq/L (ref 96–112)
GFR calc non Af Amer: 78 mL/min — ABNORMAL LOW (ref >90)
Glucose, Bld: 103 mg/dL — ABNORMAL HIGH (ref 70–99)
Sodium: 141 mEq/L (ref 135–145)
Total Bilirubin: 0.3 mg/dL (ref 0.3–1.2)

## 2012-11-28 LAB — POTASSIUM: Potassium: 2.7 mEq/L — CL (ref 3.5–5.1)

## 2012-11-28 LAB — URINALYSIS, ROUTINE W REFLEX MICROSCOPIC
Bilirubin Urine: NEGATIVE
Glucose, UA: NEGATIVE mg/dL
Ketones, ur: NEGATIVE mg/dL
pH: 6 (ref 5.0–8.0)

## 2012-11-28 LAB — CBC WITH DIFFERENTIAL/PLATELET
Basophils Absolute: 0 10*3/uL (ref 0.0–0.1)
HCT: 42 % (ref 36.0–46.0)
Lymphocytes Relative: 51 % — ABNORMAL HIGH (ref 12–46)
Lymphs Abs: 2.4 10*3/uL (ref 0.7–4.0)
Neutro Abs: 1.9 10*3/uL (ref 1.7–7.7)
Platelets: 214 10*3/uL (ref 150–400)
RBC: 4.75 MIL/uL (ref 3.87–5.11)
RDW: 14.6 % (ref 11.5–15.5)
WBC: 4.7 10*3/uL (ref 4.0–10.5)

## 2012-11-28 LAB — WET PREP, GENITAL
Clue Cells Wet Prep HPF POC: NONE SEEN
Yeast Wet Prep HPF POC: NONE SEEN

## 2012-11-28 LAB — URINE MICROSCOPIC-ADD ON

## 2012-11-28 MED ORDER — KETOROLAC TROMETHAMINE 30 MG/ML IJ SOLN
30.0000 mg | Freq: Once | INTRAMUSCULAR | Status: AC
  Administered 2012-11-28: 30 mg via INTRAVENOUS
  Filled 2012-11-28: qty 1

## 2012-11-28 MED ORDER — POTASSIUM CHLORIDE 10 MEQ/100ML IV SOLN
10.0000 meq | INTRAVENOUS | Status: DC
  Administered 2012-11-28 (×2): 10 meq via INTRAVENOUS
  Filled 2012-11-28: qty 100
  Filled 2012-11-28: qty 200

## 2012-11-28 MED ORDER — ONDANSETRON HCL 4 MG PO TABS: 4.0000 mg | ORAL_TABLET | Freq: Four times a day (QID) | ORAL | Status: DC | PRN

## 2012-11-28 MED ORDER — HYDROMORPHONE HCL PF 1 MG/ML IJ SOLN
INTRAMUSCULAR | Status: AC
  Administered 2012-11-28: 1 mg via INTRAVENOUS
  Filled 2012-11-28: qty 1

## 2012-11-28 MED ORDER — HYDROCODONE-ACETAMINOPHEN 5-325 MG PO TABS: 1.0000 | ORAL_TABLET | ORAL | Status: DC | PRN

## 2012-11-28 MED ORDER — CLONAZEPAM 0.5 MG PO TABS
1.0000 mg | ORAL_TABLET | Freq: Three times a day (TID) | ORAL | Status: DC
  Administered 2012-11-28: 1 mg via ORAL
  Filled 2012-11-28 (×2): qty 1

## 2012-11-28 MED ORDER — SODIUM CHLORIDE 0.9 % IV BOLUS (SEPSIS)
1000.0000 mL | Freq: Once | INTRAVENOUS | Status: AC
  Administered 2012-11-28: 1000 mL via INTRAVENOUS

## 2012-11-28 MED ORDER — HYDROMORPHONE HCL PF 1 MG/ML IJ SOLN
1.0000 mg | Freq: Once | INTRAMUSCULAR | Status: AC
  Administered 2012-11-28: 1 mg via INTRAVENOUS
  Filled 2012-11-28: qty 1

## 2012-11-28 MED ORDER — IOHEXOL 300 MG/ML  SOLN
50.0000 mL | Freq: Once | INTRAMUSCULAR | Status: AC | PRN
  Administered 2012-11-28: 50 mL via ORAL

## 2012-11-28 MED ORDER — MAGNESIUM SULFATE 40 MG/ML IJ SOLN
2.0000 g | Freq: Once | INTRAMUSCULAR | Status: AC
  Administered 2012-11-28: 2 g via INTRAVENOUS
  Filled 2012-11-28: qty 50

## 2012-11-28 MED ORDER — LORAZEPAM 2 MG/ML IJ SOLN
1.0000 mg | Freq: Once | INTRAMUSCULAR | Status: AC
  Administered 2012-11-28: 1 mg via INTRAVENOUS
  Filled 2012-11-28: qty 1

## 2012-11-28 MED ORDER — BENZTROPINE MESYLATE 1 MG PO TABS: 1.0000 mg | ORAL_TABLET | Freq: Every day | ORAL | Status: DC

## 2012-11-28 MED ORDER — DOCUSATE SODIUM 100 MG PO CAPS
100.0000 mg | ORAL_CAPSULE | Freq: Two times a day (BID) | ORAL | Status: DC
  Administered 2012-11-28: 100 mg via ORAL
  Filled 2012-11-28: qty 1

## 2012-11-28 MED ORDER — DULOXETINE HCL 30 MG PO CPEP
30.0000 mg | ORAL_CAPSULE | Freq: Every day | ORAL | Status: DC
Start: 2012-11-29 — End: 2012-11-29

## 2012-11-28 MED ORDER — IOHEXOL 300 MG/ML  SOLN
100.0000 mL | Freq: Once | INTRAMUSCULAR | Status: AC | PRN
  Administered 2012-11-28: 100 mL via INTRAVENOUS

## 2012-11-28 MED ORDER — ALBUTEROL SULFATE HFA 108 (90 BASE) MCG/ACT IN AERS: 1.0000 | INHALATION_SPRAY | Freq: Four times a day (QID) | RESPIRATORY_TRACT | Status: DC | PRN

## 2012-11-28 MED ORDER — POTASSIUM CHLORIDE 10 MEQ/100ML IV SOLN
10.0000 meq | INTRAVENOUS | Status: AC
  Administered 2012-11-28 (×3): 10 meq via INTRAVENOUS
  Filled 2012-11-28 (×3): qty 100

## 2012-11-28 MED ORDER — ONDANSETRON HCL 4 MG/2ML IJ SOLN: 4.0000 mg | Freq: Three times a day (TID) | INTRAMUSCULAR | Status: DC | PRN

## 2012-11-28 MED ORDER — SODIUM CHLORIDE 0.9 % IV SOLN
INTRAVENOUS | Status: DC
  Administered 2012-11-28 – 2012-11-29 (×2): via INTRAVENOUS

## 2012-11-28 MED ORDER — DIVALPROEX SODIUM ER 500 MG PO TB24
500.0000 mg | ORAL_TABLET | Freq: Three times a day (TID) | ORAL | Status: DC
  Administered 2012-11-28: 500 mg via ORAL
  Filled 2012-11-28: qty 1

## 2012-11-28 MED ORDER — SODIUM CHLORIDE 0.9 % IV SOLN
INTRAVENOUS | Status: AC
  Administered 2012-11-28: 18:00:00 via INTRAVENOUS

## 2012-11-28 MED ORDER — ONDANSETRON HCL 4 MG/2ML IJ SOLN
4.0000 mg | Freq: Once | INTRAMUSCULAR | Status: AC
  Administered 2012-11-28: 4 mg via INTRAVENOUS
  Filled 2012-11-28: qty 2

## 2012-11-28 MED ORDER — HYDROMORPHONE HCL PF 1 MG/ML IJ SOLN: 1.0000 mg | Freq: Once | INTRAMUSCULAR | Status: AC

## 2012-11-28 MED ORDER — ONDANSETRON HCL 4 MG/2ML IJ SOLN
4.0000 mg | INTRAMUSCULAR | Status: DC | PRN
  Administered 2012-11-28 – 2012-11-29 (×2): 4 mg via INTRAVENOUS
  Filled 2012-11-28 (×2): qty 2

## 2012-11-28 MED ORDER — POTASSIUM CHLORIDE 10 MEQ/100ML IV SOLN
10.0000 meq | Freq: Once | INTRAVENOUS | Status: AC
  Administered 2012-11-28: 10 meq via INTRAVENOUS

## 2012-11-28 MED ORDER — HYDROMORPHONE HCL PF 1 MG/ML IJ SOLN
1.0000 mg | INTRAMUSCULAR | Status: DC | PRN
  Administered 2012-11-28: 1 mg via INTRAVENOUS
  Filled 2012-11-28: qty 1

## 2012-11-28 MED ORDER — POTASSIUM CHLORIDE CRYS ER 20 MEQ PO TBCR
40.0000 meq | EXTENDED_RELEASE_TABLET | Freq: Once | ORAL | Status: AC
  Administered 2012-11-28: 40 meq via ORAL
  Filled 2012-11-28: qty 2

## 2012-11-28 MED ORDER — ONDANSETRON HCL 4 MG/2ML IJ SOLN: 4.0000 mg | Freq: Four times a day (QID) | INTRAMUSCULAR | Status: DC | PRN

## 2012-11-28 MED ORDER — ACETAMINOPHEN 650 MG RE SUPP: 650.0000 mg | Freq: Four times a day (QID) | RECTAL | Status: DC | PRN

## 2012-11-28 MED ORDER — HYDROCHLOROTHIAZIDE 12.5 MG PO CAPS: 12.5000 mg | ORAL_CAPSULE | Freq: Every morning | ORAL | Status: DC

## 2012-11-28 MED ORDER — HYDROMORPHONE HCL PF 1 MG/ML IJ SOLN
0.5000 mg | INTRAMUSCULAR | Status: DC | PRN
  Administered 2012-11-29 (×2): 0.5 mg via INTRAVENOUS
  Filled 2012-11-28 (×2): qty 1

## 2012-11-28 MED ORDER — ZIPRASIDONE HCL 40 MG PO CAPS
40.0000 mg | ORAL_CAPSULE | Freq: Two times a day (BID) | ORAL | Status: DC
  Filled 2012-11-28 (×7): qty 1

## 2012-11-28 MED ORDER — ACETAMINOPHEN 325 MG PO TABS
650.0000 mg | ORAL_TABLET | Freq: Four times a day (QID) | ORAL | Status: DC | PRN
Start: 2012-11-28 — End: 2012-11-29

## 2012-11-28 NOTE — Progress Notes (Signed)
Spoke with patient to gather information about her respiratory history and she informed me that she did not want to use or wear our CPAP unit. I will continue to monitor patient through the night to see if I need to apply O2 or a CPAP unit to aid in her respiratory efforts.

## 2012-11-28 NOTE — ED Notes (Signed)
Pt grimacing and moaning in pain. Slightly restless. edp in now

## 2012-11-28 NOTE — Progress Notes (Signed)
Pt refuses to take medicine. States that it makes her feel bad and she has not taken her medicines for a long while. Explained to the pt the benefits of taking her medicines. She verbalizes understanding. Will continue to monitor.

## 2012-11-28 NOTE — ED Notes (Signed)
CRITICAL VALUE ALERT  Critical value received:  K+ 2.7 Date of notification:  11/28/2012 Time of notification:  1504 Critical value read back:yes  Nurse who received alert:  lrt  MD notified (1st page): Rancour Time of first page: 1507 MD notified (2nd page):  Time of second page:  Responding MD: rancour Time MD responded:  [phone]

## 2012-11-28 NOTE — ED Notes (Signed)
Vomited large amount clear liquid.

## 2012-11-28 NOTE — ED Provider Notes (Signed)
History    This chart was scribed for Ashley Octave, MD by Leone Payor, ED Scribe. This patient was seen in room [facility] and the patient's care was started 9:54 AM.   CSN: [id number]  Arrival date & time 11/28/12  0936   First MD Initiated Contact with Patient 11/28/12 [phone]      Chief Complaint  Patient presents with  . Abdominal Pain     The history is provided by the patient. No language interpreter was used.    HPI Comments: Ashley Patrick is a 57 y.o. female who presents to the Emergency Department complaining of gradual onset, gradually worsening, constant LLQ pain with associated nausea that started 1 week ago. She denies vomiting, fever, diarrhea, bloody stools, dysuria, hematuria, vaginal bleeding/discharge, back pain, chest pain. Pt denies having any sick contacts with similar symptoms. She denies any recent travelling. Pt has h/o partial hysterectomy. She still has her appendix and gallbladder.   Past Medical History  Diagnosis Date  . Back pain   . Obesity   . Nicotine addiction   . Psychosis   . Hypertension   . OD (overdose of drug)     hospitalized for 11 days   . Bipolar 1 disorder   . Hypercholesteremia   . Migraine headache   . Lateral epicondylitis  of elbow     right  . Sleep apnea     STOP BANG score: 4    Past Surgical History  Procedure Laterality Date  . Partial hysterectomy    . Breast biopsy  10/18/2011    Procedure: BREAST BIOPSY;  Surgeon: Dalia Heading, MD;  Location: [facility];  Service: General;  Laterality: Left;    Family History  Problem Relation Age of Onset  . Anesthesia problems Neg Hx   . Hypotension Neg Hx   . Malignant hyperthermia Neg Hx   . Pseudochol deficiency Neg Hx     History  Substance Use Topics  . Smoking status: Current Every Day Smoker -- 0.50 packs/day for 38 years    Types: Cigarettes  . Smokeless tobacco: Not on file  . Alcohol Use: No    OB History   Grav Para Term Preterm Abortions TAB SAB Ect  Mult Living                  Review of Systems A complete 10 system review of systems was obtained and all systems are negative except as noted in the HPI and PMH.   Allergies  Review of patient's allergies indicates no known allergies.  Home Medications   Current Outpatient Rx  Name  Route  Sig  Dispense  Refill  . amLODipine-benazepril (LOTREL) 5-10 MG per capsule   Oral   Take 1 capsule by mouth daily.         . benztropine (COGENTIN) 1 MG tablet   Oral   Take 1 mg by mouth daily.         . clonazePAM (KLONOPIN) 1 MG tablet   Oral   Take 1 mg by mouth 3 (three) times daily.          . divalproex (DEPAKOTE) 500 MG 24 hr tablet   Oral   Take 500 mg by mouth 3 (three) times daily.          . DULoxetine (CYMBALTA) 30 MG capsule   Oral   Take 30 mg by mouth daily.         . hydrochlorothiazide (MICROZIDE) 12.5 MG capsule  Oral   Take 12.5 mg by mouth every morning.         . predniSONE (DELTASONE) 10 MG tablet   Oral   Take 2 tablets (20 mg total) by mouth daily.   10 tablet   0   . QUEtiapine (SEROQUEL) 100 MG tablet   Oral   Take 100-200 mg by mouth at bedtime. May take up too 2 tablets         . ziprasidone (GEODON) 40 MG capsule   Oral   Take 40 mg by mouth 2 (two) times daily with a meal.         . albuterol (PROVENTIL HFA;VENTOLIN HFA) 108 (90 BASE) MCG/ACT inhaler   Inhalation   Inhale 1-2 puffs into the lungs every 6 (six) hours as needed for wheezing.   1 Inhaler   0     BP 111/64  Pulse 67  Temp(Src) 98.3 F (36.8 C) (Oral)  Resp 20  SpO2 96%  Physical Exam  Nursing note and vitals reviewed. Constitutional: She is oriented to person, place, and time. She appears well-developed and well-nourished. No distress.  Uncomfortable appearing.   HENT:  Head: Normocephalic and atraumatic.  Eyes: EOM are normal.  Neck: Neck supple. No tracheal deviation present.  Cardiovascular: Normal rate, regular rhythm, normal heart sounds  and intact distal pulses.   Intact femoral, DP and PT pulses.   Pulmonary/Chest: Effort normal. No respiratory distress.  Abdominal: Soft. There is tenderness. There is guarding.  Tenderness to LLQ with guarding.   Genitourinary: Vaginal discharge found.  Soft no CVA tenderness.  Cervix absent. Left adnexal tenderness.  Musculoskeletal: Normal range of motion.  Neurological: She is alert and oriented to person, place, and time.  Skin: Skin is warm and dry.  Psychiatric: She has a normal mood and affect. Her behavior is normal.    ED Course  Procedures (including critical care time)  DIAGNOSTIC STUDIES: Oxygen Saturation is 100% on room air, normal by my interpretation.    COORDINATION OF CARE: 9:55 AM Discussed treatment plan with pt at bedside and pt agreed to plan.   Labs Reviewed  WET PREP, GENITAL - Abnormal; Notable for the following:    WBC, Wet Prep HPF POC RARE (*)    All other components within normal limits  CBC WITH DIFFERENTIAL - Abnormal; Notable for the following:    Neutrophils Relative % 40 (*)    Lymphocytes Relative 51 (*)    All other components within normal limits  COMPREHENSIVE METABOLIC PANEL - Abnormal; Notable for the following:    Potassium 2.7 (*)    Glucose, Bld 103 (*)    Total Protein 8.7 (*)    GFR calc non Af Amer 78 (*)    All other components within normal limits  LACTIC ACID, PLASMA - Abnormal; Notable for the following:    Lactic Acid, Venous 3.3 (*)    All other components within normal limits  URINALYSIS, ROUTINE W REFLEX MICROSCOPIC - Abnormal; Notable for the following:    Specific Gravity, Urine <1.005 (*)    Hgb urine dipstick TRACE (*)    Leukocytes, UA TRACE (*)    All other components within normal limits  URINE MICROSCOPIC-ADD ON - Abnormal; Notable for the following:    Bacteria, UA MANY (*)    All other components within normal limits  POTASSIUM - Abnormal; Notable for the following:    Potassium 2.7 (*)    All other  components within normal limits  GC/CHLAMYDIA PROBE AMP  LIPASE, BLOOD  PREGNANCY, URINE  LACTIC ACID, PLASMA   US Transvaginal Non-ob  11/28/2012   *RADIOLOGY REPORT*  Clinical Data:  Left lower quadrant pain.  Ovarian torsion. History partial hysterectomy.  TRANSABDOMINAL AND TRANSVAGINAL ULTRASOUND OF PELVIS DOPPLER ULTRASOUND OF OVARIES  Technique:  Both transabdominal and transvaginal ultrasound examinations of the pelvis were performed. Transabdominal technique was performed for global imaging of the pelvis including uterus, ovaries, adnexal regions, and pelvic cul-de-sac.  It was necessary to proceed with endovaginal exam following the transabdominal exam to visualize the adnexa.  Color and duplex Doppler ultrasound was utilized to evaluate blood flow to the ovaries.  Comparison:  CT 11/29/2011.  FINDINGS  Uterus:  Hysterectomy.  Endometrium:  Hysterectomy.  Right ovary: 28 mm x 20 mm x 20 mm with normal arterial and venous waveforms.  Normal color flow.  Left ovary: Normal physiologic appearance measuring 31 mm x 20 mm x 17 mm.  Normal arterial and venous waveforms.  Normal color flow. 12 mm central echogenic region most compatible with hemorrhagic cyst.  Pulsed Doppler evaluation demonstrates normal low-resistance arterial and venous waveforms in both ovaries.  IMPRESSION:  Hysterectomy.  12 mm echogenic lesion in the left ovary most likely represents hemorrhagic cyst.  Follow-up 12-week pelvic ultrasound recommended for reassessment.  No sonographic evidence for ovarian torsion.   Original Report Authenticated By: Andreas Newport, M.D.   US Pelvis Complete  11/28/2012   *RADIOLOGY REPORT*  Clinical Data:  Left lower quadrant pain.  Ovarian torsion. History partial hysterectomy.  TRANSABDOMINAL AND TRANSVAGINAL ULTRASOUND OF PELVIS DOPPLER ULTRASOUND OF OVARIES  Technique:  Both transabdominal and transvaginal ultrasound examinations of the pelvis were performed. Transabdominal technique was  performed for global imaging of the pelvis including uterus, ovaries, adnexal regions, and pelvic cul-de-sac.  It was necessary to proceed with endovaginal exam following the transabdominal exam to visualize the adnexa.  Color and duplex Doppler ultrasound was utilized to evaluate blood flow to the ovaries.  Comparison:  CT 11/29/2011.  FINDINGS  Uterus:  Hysterectomy.  Endometrium:  Hysterectomy.  Right ovary: 28 mm x 20 mm x 20 mm with normal arterial and venous waveforms.  Normal color flow.  Left ovary: Normal physiologic appearance measuring 31 mm x 20 mm x 17 mm.  Normal arterial and venous waveforms.  Normal color flow. 12 mm central echogenic region most compatible with hemorrhagic cyst.  Pulsed Doppler evaluation demonstrates normal low-resistance arterial and venous waveforms in both ovaries.  IMPRESSION:  Hysterectomy.  12 mm echogenic lesion in the left ovary most likely represents hemorrhagic cyst.  Follow-up 12-week pelvic ultrasound recommended for reassessment.  No sonographic evidence for ovarian torsion.   Original Report Authenticated By: Andreas Newport, M.D.   Ct Abdomen Pelvis W Contrast  11/28/2012   *RADIOLOGY REPORT*  Clinical Data: 57 year old female with left-sided abdominal and pelvic pain, nausea and vomiting.  CT ABDOMEN AND PELVIS WITH CONTRAST  Technique:  Multidetector CT imaging of the abdomen and pelvis was performed following the standard protocol during bolus administration of intravenous contrast.  Contrast:  100 ml intravenous Omnipaque-300  Comparison: 12/15/2010 CT  Findings: The liver, spleen, pancreas, adrenal glands, gallbladder and kidneys are unremarkable except for hepatic and right renal cysts.  No free fluid, enlarged lymph nodes, biliary dilation or abdominal aortic aneurysm identified. The bowel, appendix and bladder are unremarkable. The patient is status post hysterectomy. There is no evidence of bowel obstruction, pneumoperitoneum or abscess.  No acute or  suspicious bony  abnormalities are identified.  IMPRESSION: No evidence of acute abnormality or identifiable cause for this patient's left abdominal/pelvic pain.   Original Report Authenticated By: Harmon Pier, M.D.   [place] Art/ven Flow Abd Pelv Doppler  11/28/2012   *RADIOLOGY REPORT*  Clinical Data:  Left lower quadrant pain.  Ovarian torsion. History partial hysterectomy.  TRANSABDOMINAL AND TRANSVAGINAL ULTRASOUND OF PELVIS DOPPLER ULTRASOUND OF OVARIES  Technique:  Both transabdominal and transvaginal ultrasound examinations of the pelvis were performed. Transabdominal technique was performed for global imaging of the pelvis including uterus, ovaries, adnexal regions, and pelvic cul-de-sac.  It was necessary to proceed with endovaginal exam following the transabdominal exam to visualize the adnexa.  Color and duplex Doppler ultrasound was utilized to evaluate blood flow to the ovaries.  Comparison:  CT 11/29/2011.  FINDINGS  Uterus:  Hysterectomy.  Endometrium:  Hysterectomy.  Right ovary: 28 mm x 20 mm x 20 mm with normal arterial and venous waveforms.  Normal color flow.  Left ovary: Normal physiologic appearance measuring 31 mm x 20 mm x 17 mm.  Normal arterial and venous waveforms.  Normal color flow. 12 mm central echogenic region most compatible with hemorrhagic cyst.  Pulsed Doppler evaluation demonstrates normal low-resistance arterial and venous waveforms in both ovaries.  IMPRESSION:  Hysterectomy.  12 mm echogenic lesion in the left ovary most likely represents hemorrhagic cyst.  Follow-up 12-week pelvic ultrasound recommended for reassessment.  No sonographic evidence for ovarian torsion.   Original Report Authenticated By: Andreas Newport, M.D.   Mm Digital Diagnostic Unilat R  11/27/2012   *RADIOLOGY REPORT*  Clinical Data:  Abnormal right screening mammogram  DIGITAL DIAGNOSTIC RIGHT MAMMOGRAM  Comparison: With priors  Findings:  ACR Breast Density Category 3: The breast tissue is heterogeneously  dense.  There are diffuse round and punctate calcifications in the medial and central aspect of the right breast as well as a few scattered coarse calcifications.  They are felt to likely be benign.  There is no suspicious mass.  IMPRESSION: Probable benign calcifications in the right breast.  RECOMMENDATION: Right diagnostic mammogram in 6 months is recommended.  I have discussed the findings and recommendations with the patient. Results were also provided in writing at the conclusion of the visit.  If applicable, a reminder letter will be sent to the patient regarding her next appointment.  BI-RADS CATEGORY 3:  Probably benign finding(s) - short interval follow-up suggested.   Original Report Authenticated By: Baird Lyons, M.D.     1. Ovarian cyst   2. Intractable nausea and vomiting   3. Hypokalemia       MDM  LLQ pain with nausea x 6 days, gradually worsening. No vomiting or diarrhea. No urinary or vaginal symptoms. Decreased appetite.  LLQ TTP without peritoneal signs. Labs remarkable for hypokalemia with lactate elevation. CT scan shows no acute pathology. Will obtain US to assess for torsion.  US shows normal blood flow to L ovary.  Hemorrhagic cyst is likely source of patient's pain. Hypokalemia of 2.7 without EKG changes. IV and by mouth potassium ordered as well as IV magnesium.  she is still feeling poorly. She still having pain and vomiting. Suspect her pain is likely due to her ovarian hemorrhagic cyst. Given her ongoing symptoms, will admit for hydration and symptom control. Will also need continued repletion of her potassium. D/w Dr. Irene Limbo.   Date: 11/28/2012  Rate: 67  Rhythm: normal sinus rhythm  QRS Axis: normal  Intervals: normal  ST/T Wave abnormalities: normal  Conduction Disutrbances:none  Narrative Interpretation:   Old EKG Reviewed: none available and unchanged    I personally performed the services described in this documentation, which was scribed in my  presence. The recorded information has been reviewed and is accurate.     Ashley Octave, MD 11/28/12 [phone]

## 2012-11-28 NOTE — H&P (Signed)
History and Physical  Ashley Patrick ZOX:[id number] DOB: 1955-09-08 DOA: 11/28/2012  Referring physician: Dr. Manus Gunning PCP: Geraldo Pitter, MD   Chief Complaint: Abdominal pain  HPI:  57 year old woman who presents the emergency department with left lower cautery pain associated with nausea. Initial evaluation was notable for a left ovarian hemorrhagic cyst. Pain control was difficult and patient was referred for admission and hydration.  Patient reports symptoms began possibly one week ago when she developed an aching discomfort in her left lower quadrant. However it was manageable and she did not seek evaluation. Last night the pain worsened to 9/10. It is intermittent in nature. Pain was significantly worse overnight and so patient came to the emergency department. She has been able to eat and drink fluids although she does have persistent nausea. She has not had vomiting prior to being in the emergency room. Bowels have been moving normally. No specific aggravating or alleviating factors. She has a history of a hysterectomy for "cysts". She is not have a gynecologist.  In the emergency department noted to be afebrile stable vital signs. Screening laboratory studies were notable for hypokalemia. Initial elevation of lactate resolved. Urinalysis was generally unremarkable. CT of the abdomen and pelvis was unremarkable. Transvaginal ultrasound was notable for a left ovarian hemorrhagic cyst. No evidence of torsion.  Review of Systems:  Negative for fever, visual changes, sore throat, rash, new muscle aches, chest pain, shortness of breath, dysuria, bleeding.  Past Medical History  Diagnosis Date  . Back pain   . Obesity   . Nicotine addiction   . Psychosis   . Hypertension   . OD (overdose of drug)     hospitalized for 11 days   . Bipolar 1 disorder   . Hypercholesteremia   . Migraine headache   . Lateral epicondylitis  of elbow     right  . Sleep apnea     STOP BANG score: 4    Past  Surgical History  Procedure Laterality Date  . Partial hysterectomy    . Breast biopsy  10/18/2011    Procedure: BREAST BIOPSY;  Surgeon: Dalia Heading, MD;  Location: [facility];  Service: General;  Laterality: Left;    Social History:  reports that she has been smoking Cigarettes.  She has a 19 pack-year smoking history. She uses smokeless tobacco. She reports that she does not drink alcohol or use illicit drugs.  No Known Allergies  Family History  Problem Relation Age of Onset  . Anesthesia problems Neg Hx   . Hypotension Neg Hx   . Malignant hyperthermia Neg Hx   . Pseudochol deficiency Neg Hx      Prior to Admission medications   Medication Sig Start Date End Date Taking? Authorizing Provider  amLODipine-benazepril (LOTREL) 5-10 MG per capsule Take 1 capsule by mouth daily.   Yes Historical Provider, MD  benztropine (COGENTIN) 1 MG tablet Take 1 mg by mouth daily.   Yes Historical Provider, MD  clonazePAM (KLONOPIN) 1 MG tablet Take 1 mg by mouth 3 (three) times daily.    Yes Historical Provider, MD  divalproex (DEPAKOTE) 500 MG 24 hr tablet Take 500 mg by mouth 3 (three) times daily.    Yes Historical Provider, MD  DULoxetine (CYMBALTA) 30 MG capsule Take 30 mg by mouth daily.   Yes Historical Provider, MD  hydrochlorothiazide (MICROZIDE) 12.5 MG capsule Take 12.5 mg by mouth every morning.   Yes Historical Provider, MD  predniSONE (DELTASONE) 10 MG tablet Take 2  tablets (20 mg total) by mouth daily. 09/06/12  Yes Nicoletta Dress. Colon Branch, MD  QUEtiapine (SEROQUEL) 100 MG tablet Take 100-200 mg by mouth at bedtime. May take up too 2 tablets   Yes Historical Provider, MD  ziprasidone (GEODON) 40 MG capsule Take 40 mg by mouth 2 (two) times daily with a meal.   Yes Historical Provider, MD  albuterol (PROVENTIL HFA;VENTOLIN HFA) 108 (90 BASE) MCG/ACT inhaler Inhale 1-2 puffs into the lungs every 6 (six) hours as needed for wheezing. 09/06/12   Nicoletta Dress. Colon Branch, MD   Physical Exam: Filed Vitals:    11/28/12 0948 11/28/12 1618 11/28/12 1711  BP: 142/113 111/64 127/76  Pulse: 103 67 71  Temp: 98.3 F (36.8 C)    TempSrc: Oral    Resp: 22 20 20   Height:   5\' 5"  (1.651 m)  Weight:   93.078 kg (205 lb 3.2 oz)  SpO2: 100% 96% 100%    General: examined in ED. Appears and mildly uncomfortable. Nontoxic. Eyes: PERRL, normal lids, irises  ENT: grossly normal hearing, lips & tongue Neck: no LAD, masses or thyromegaly Cardiovascular: RRR, no m/r/g. No LE edema. Respiratory: CTA bilaterally, no w/r/r. Normal respiratory effort. Abdomen: soft, nondistended, positive bowel sounds. No right upper quadrant pain, epigastric pain, right lower part of pain. No rebound or guarding. Moderate left lower quadrant pain. Skin: no rash or induration seen  Musculoskeletal: grossly normal tone BUE/BLE Psychiatric: grossly normal mood and affect, speech fluent and appropriate Neurologic: grossly non-focal.  Wt Readings from Last 3 Encounters:  11/28/12 93.078 kg (205 lb 3.2 oz)  09/06/12 81.647 kg (180 lb)  10/16/11 93.895 kg (207 lb)    Labs on Admission:  Basic Metabolic Panel:  Recent Labs Lab 11/28/12 1001 11/28/12 1424  NA 141  --   K 2.7* 2.7*  CL 101  --   CO2 25  --   GLUCOSE 103*  --   BUN 16  --   CREATININE 0.83  --   CALCIUM 10.3  --     Liver Function Tests:  Recent Labs Lab 11/28/12 1001  AST 18  ALT 23  ALKPHOS 117  BILITOT 0.3  PROT 8.7*  ALBUMIN 4.4    Recent Labs Lab 11/28/12 1001  LIPASE 32    CBC:  Recent Labs Lab 11/28/12 1001  WBC 4.7  NEUTROABS 1.9  HGB 14.6  HCT 42.0  MCV 88.4  PLT 214    Radiological Exams on Admission: US Transvaginal Non-ob  11/28/2012   *RADIOLOGY REPORT*  Clinical Data:  Left lower quadrant pain.  Ovarian torsion. History partial hysterectomy.  TRANSABDOMINAL AND TRANSVAGINAL ULTRASOUND OF PELVIS DOPPLER ULTRASOUND OF OVARIES  Technique:  Both transabdominal and transvaginal ultrasound examinations of the pelvis  were performed. Transabdominal technique was performed for global imaging of the pelvis including uterus, ovaries, adnexal regions, and pelvic cul-de-sac.  It was necessary to proceed with endovaginal exam following the transabdominal exam to visualize the adnexa.  Color and duplex Doppler ultrasound was utilized to evaluate blood flow to the ovaries.  Comparison:  CT 11/29/2011.  FINDINGS  Uterus:  Hysterectomy.  Endometrium:  Hysterectomy.  Right ovary: 28 mm x 20 mm x 20 mm with normal arterial and venous waveforms.  Normal color flow.  Left ovary: Normal physiologic appearance measuring 31 mm x 20 mm x 17 mm.  Normal arterial and venous waveforms.  Normal color flow. 12 mm central echogenic region most compatible with hemorrhagic cyst.  Pulsed Doppler evaluation demonstrates  normal low-resistance arterial and venous waveforms in both ovaries.  IMPRESSION:  Hysterectomy.  12 mm echogenic lesion in the left ovary most likely represents hemorrhagic cyst.  Follow-up 12-week pelvic ultrasound recommended for reassessment.  No sonographic evidence for ovarian torsion.   Original Report Authenticated By: Andreas Newport, M.D.   US Pelvis Complete  11/28/2012   *RADIOLOGY REPORT*  Clinical Data:  Left lower quadrant pain.  Ovarian torsion. History partial hysterectomy.  TRANSABDOMINAL AND TRANSVAGINAL ULTRASOUND OF PELVIS DOPPLER ULTRASOUND OF OVARIES  Technique:  Both transabdominal and transvaginal ultrasound examinations of the pelvis were performed. Transabdominal technique was performed for global imaging of the pelvis including uterus, ovaries, adnexal regions, and pelvic cul-de-sac.  It was necessary to proceed with endovaginal exam following the transabdominal exam to visualize the adnexa.  Color and duplex Doppler ultrasound was utilized to evaluate blood flow to the ovaries.  Comparison:  CT 11/29/2011.  FINDINGS  Uterus:  Hysterectomy.  Endometrium:  Hysterectomy.  Right ovary: 28 mm x 20 mm x 20 mm with  normal arterial and venous waveforms.  Normal color flow.  Left ovary: Normal physiologic appearance measuring 31 mm x 20 mm x 17 mm.  Normal arterial and venous waveforms.  Normal color flow. 12 mm central echogenic region most compatible with hemorrhagic cyst.  Pulsed Doppler evaluation demonstrates normal low-resistance arterial and venous waveforms in both ovaries.  IMPRESSION:  Hysterectomy.  12 mm echogenic lesion in the left ovary most likely represents hemorrhagic cyst.  Follow-up 12-week pelvic ultrasound recommended for reassessment.  No sonographic evidence for ovarian torsion.   Original Report Authenticated By: Andreas Newport, M.D.   Ct Abdomen Pelvis W Contrast  11/28/2012   *RADIOLOGY REPORT*  Clinical Data: 57 year old female with left-sided abdominal and pelvic pain, nausea and vomiting.  CT ABDOMEN AND PELVIS WITH CONTRAST  Technique:  Multidetector CT imaging of the abdomen and pelvis was performed following the standard protocol during bolus administration of intravenous contrast.  Contrast:  100 ml intravenous Omnipaque-300  Comparison: 12/15/2010 CT  Findings: The liver, spleen, pancreas, adrenal glands, gallbladder and kidneys are unremarkable except for hepatic and right renal cysts.  No free fluid, enlarged lymph nodes, biliary dilation or abdominal aortic aneurysm identified. The bowel, appendix and bladder are unremarkable. The patient is status post hysterectomy. There is no evidence of bowel obstruction, pneumoperitoneum or abscess.  No acute or suspicious bony abnormalities are identified.  IMPRESSION: No evidence of acute abnormality or identifiable cause for this patient's left abdominal/pelvic pain.   Original Report Authenticated By: Harmon Pier, M.D.   [place] Art/ven Flow Abd Pelv Doppler  11/28/2012   *RADIOLOGY REPORT*  Clinical Data:  Left lower quadrant pain.  Ovarian torsion. History partial hysterectomy.  TRANSABDOMINAL AND TRANSVAGINAL ULTRASOUND OF PELVIS DOPPLER  ULTRASOUND OF OVARIES  Technique:  Both transabdominal and transvaginal ultrasound examinations of the pelvis were performed. Transabdominal technique was performed for global imaging of the pelvis including uterus, ovaries, adnexal regions, and pelvic cul-de-sac.  It was necessary to proceed with endovaginal exam following the transabdominal exam to visualize the adnexa.  Color and duplex Doppler ultrasound was utilized to evaluate blood flow to the ovaries.  Comparison:  CT 11/29/2011.  FINDINGS  Uterus:  Hysterectomy.  Endometrium:  Hysterectomy.  Right ovary: 28 mm x 20 mm x 20 mm with normal arterial and venous waveforms.  Normal color flow.  Left ovary: Normal physiologic appearance measuring 31 mm x 20 mm x 17 mm.  Normal arterial and venous waveforms.  Normal color flow. 12 mm central echogenic region most compatible with hemorrhagic cyst.  Pulsed Doppler evaluation demonstrates normal low-resistance arterial and venous waveforms in both ovaries.  IMPRESSION:  Hysterectomy.  12 mm echogenic lesion in the left ovary most likely represents hemorrhagic cyst.  Follow-up 12-week pelvic ultrasound recommended for reassessment.  No sonographic evidence for ovarian torsion.   Original Report Authenticated By: Andreas Newport, M.D.   Mm Digital Diagnostic Unilat R  11/27/2012   *RADIOLOGY REPORT*  Clinical Data:  Abnormal right screening mammogram  DIGITAL DIAGNOSTIC RIGHT MAMMOGRAM  Comparison: With priors  Findings:  ACR Breast Density Category 3: The breast tissue is heterogeneously dense.  There are diffuse round and punctate calcifications in the medial and central aspect of the right breast as well as a few scattered coarse calcifications.  They are felt to likely be benign.  There is no suspicious mass.  IMPRESSION: Probable benign calcifications in the right breast.  RECOMMENDATION: Right diagnostic mammogram in 6 months is recommended.  I have discussed the findings and recommendations with the patient.  Results were also provided in writing at the conclusion of the visit.  If applicable, a reminder letter will be sent to the patient regarding her next appointment.  BI-RADS CATEGORY 3:  Probably benign finding(s) - short interval follow-up suggested.   Original Report Authenticated By: Baird Lyons, M.D.    EKG: Independently reviewed. Sinus rhythm, LVH with repolarization abnormality   Principal Problem:   LLQ pain Active Problems:   Hemorrhagic cyst of ovary   Nausea with vomiting   Hypokalemia   Bipolar disorder, unspecified   Assessment/Plan 1. Left lower quadrant abdominal pain: Secondary to hemorrhagic cyst, and imaging otherwise unremarkable. Exam benign. Admitted for pain control, IV fluids, antiemetics. 2. Nausea, vomiting: Antiemetics. Likely secondary to pain. Monitor clinically. 3. Hypokalemia: Replete. Check serum magnesium in the morning. 4. History of bipolar disorder: Stable. Continue Geodon, Cogentin, Klonopin, Cymbalta, Depakote 5. Obstructive sleep apnea: Continue CPAP  Code Status: Full code Family Communication: None present Disposition Plan/Anticipated LOS: Observation, one to 2 days  Time spent: 50 minutes  Brendia Sacks, MD  [facility] Pager [phone] 11/28/2012, 6:34 PM

## 2012-11-28 NOTE — ED Notes (Signed)
Pt c/o intermittent llq pain with nausea x 1 week but worse past one day. Denies v/d/vag discharge/urinary changes. Denies black or bloody stools. lnbm yesterday. Tender to LLQ,. Pt tearful and uncomfortable.

## 2012-11-28 NOTE — ED Notes (Addendum)
Okayed w/Dr. Manus Gunning drawing K+ level while finishing last KCL run.

## 2012-11-28 NOTE — ED Notes (Signed)
CRITICAL VALUE ALERT  Critical value received:  Potassium 2.7  Date of notification:  11/28/12  Time of notification:  1026  Critical value read back:yes  Nurse who received alert:  Lyn Records  MD notified (1st page):  Dr. Manus Gunning  Time of first page:  1025  MD notified (2nd page):  Time of second page:  Responding MD:  Dr. Manus Gunning  Time MD responded:  [phone]

## 2012-11-28 NOTE — ED Notes (Signed)
Pt. Vomiting up clear fluid. MD made aware.

## 2012-11-28 NOTE — ED Notes (Signed)
Report called to Val, RN on unit 300.

## 2012-11-29 DIAGNOSIS — I10 Essential (primary) hypertension: Secondary | ICD-10-CM

## 2012-11-29 DIAGNOSIS — R1084 Generalized abdominal pain: Secondary | ICD-10-CM

## 2012-11-29 LAB — CBC
HCT: 39.6 % (ref 36.0–46.0)
MCH: 31.7 pg (ref 26.0–34.0)
MCV: 88.4 fL (ref 78.0–100.0)
Platelets: 197 10*3/uL (ref 150–400)
RDW: 14.8 % (ref 11.5–15.5)

## 2012-11-29 LAB — BASIC METABOLIC PANEL
CO2: 26 mEq/L (ref 19–32)
Calcium: 10.2 mg/dL (ref 8.4–10.5)
Chloride: 102 mEq/L (ref 96–112)
Creatinine, Ser: 0.75 mg/dL (ref 0.50–1.10)
Glucose, Bld: 108 mg/dL — ABNORMAL HIGH (ref 70–99)

## 2012-11-29 MED ORDER — ONDANSETRON HCL 4 MG PO TABS: 4.0000 mg | ORAL_TABLET | Freq: Four times a day (QID) | ORAL | Status: DC | PRN

## 2012-11-29 MED ORDER — HYDROCODONE-ACETAMINOPHEN 5-325 MG PO TABS: 1.0000 | ORAL_TABLET | ORAL | Status: DC | PRN

## 2012-11-29 MED ORDER — POTASSIUM CHLORIDE 10 MEQ/100ML IV SOLN
INTRAVENOUS | Status: AC
  Administered 2012-11-29: 10 meq via INTRAVENOUS
  Filled 2012-11-29: qty 100

## 2012-11-29 NOTE — Progress Notes (Signed)
[facility] PROGRESS NOTE  Ashley Patrick:[id number] DOB: Feb 08, 1956 DOA: 11/28/2012 PCP: Geraldo Pitter, MD  Assessment/Plan: 1. Left lower quadrant abdominal pain secondary to hemorrhagic cyst: Patient appears nearly resolved. Overall she is tolerating a diet and wants to go home. 2. Nausea and vomiting, much improved. Discharged home on antiemetics. 3. Hypokalemia: Repleted. 4. History bipolar disorder: Appears stable. She does not take any psychiatric medications at this time. 5. Obstructive sleep apnea: Stable. 6. Hypertension: Stable.   Discharge home today on oral antiemetics and pain medication  Followup with GYN as an outpatient  Code Status: Full code Family Communication: Discussed with family at bedside Disposition Plan: Home  Brendia Sacks, MD  [facility]  Pager [phone] If 7PM-7AM, please contact night-coverage at [email or web address], password [facility] 11/29/2012, 1:26 PM  LOS: 1 day   Clinical Summary: 57 year old woman who presents the emergency department with left lower cautery pain associated with nausea. Initial evaluation was notable for a left ovarian hemorrhagic cyst. Pain control was difficult and patient was referred for admission and hydration.  Consultants:  none  Procedures:  none  Antibiotics:  none  HPI/Subjective: Feels much better. Pain is intermittent but nearly resolved. A little bit of vomiting but overall tolerating fluids and very much wants to go home. Refusing medications--she only takes blood pressure medications at home, no psychiatric medications. She is not take prednisone or albuterol.  Objective: Filed Vitals:   11/28/12 1711 11/28/12 1906 11/28/12 2123 11/29/12 0419  BP: 127/76 105/72 144/86 117/78  Pulse: 71 70 74 76  Temp:  98.1 F (36.7 C) 97.5 F (36.4 C) 98.3 F (36.8 C)  TempSrc:  Oral Oral Oral  Resp: 20 20 20 20   Height: 5\' 5"  (1.651 m)     Weight: 93.078 kg (205 lb 3.2 oz)     SpO2: 100% 96% 96%  96%    Intake/Output Summary (Last 24 hours) at 11/29/12 1326 Last data filed at 11/29/12 0240  Gross per 24 hour  Intake    360 ml  Output      0 ml  Net    360 ml     Filed Weights   11/28/12 1711  Weight: 93.078 kg (205 lb 3.2 oz)    Exam:  General:  Appears calm and comfortable, smokes Cardiovascular: RRR, no m/r/g. No LE edema. Respiratory: CTA bilaterally, no w/r/r. Normal respiratory effort. Abdomen: soft, ntnd, positive bowel sounds. No epigastric or right upper quadrant pain. No left lower cautery pain. No rebound or guarding. Psychiatric: grossly normal mood and affect, speech fluent and appropriate Neurologic: grossly non-focal.  Data Reviewed:  Potassium now normal 3.5. CBC unremarkable.  Pending studies:  None  Scheduled Meds: . benztropine  1 mg Oral Daily  . clonazePAM  1 mg Oral TID  . divalproex  500 mg Oral TID  . docusate sodium  100 mg Oral BID  . DULoxetine  30 mg Oral Daily  . hydrochlorothiazide  12.5 mg Oral q morning - 10a  . ziprasidone  40 mg Oral BID WC   Continuous Infusions: . sodium chloride 75 mL/hr at 11/29/12 0058    Principal Problem:   LLQ pain Active Problems:   Hemorrhagic cyst of ovary   Nausea with vomiting   Hypokalemia   Bipolar disorder, unspecified

## 2012-11-29 NOTE — Progress Notes (Signed)
Utilization Review Complete  

## 2012-11-29 NOTE — Progress Notes (Signed)
Patient given d/c instructions and verbalizes understanding. IV cath removed with cath intact. No pain/swelling at site.

## 2012-11-29 NOTE — Discharge Summary (Signed)
Physician Discharge Summary  Ashley Patrick ZOX:[id number] DOB: Jul 13, 1955 DOA: 11/28/2012  PCP: Geraldo Pitter, MD  Admit date: 11/28/2012 Discharge date: 11/29/2012  Recommendations for Outpatient Follow-up:  1. Followup hemorrhagic ovarian cyst   Follow-up Information   Follow up with Tilda Burrow, MD. Schedule an appointment as soon as possible for a visit in 2 weeks.   Contact information:   [address] [place] [address] [phone]      Discharge Diagnoses:  1. Left lower quadrant pain secondary to hemorrhagic cyst 2. Nausea and vomiting secondary to above 3. Hypokalemia  Discharge Condition: Improved Disposition: Home  Diet recommendation: Regular  Filed Weights   11/28/12 1711  Weight: 93.078 kg (205 lb 3.2 oz)    History of present illness:  57 year old woman who presents the emergency department with left lower quadrant pain associated with nausea. Initial evaluation was notable for a left ovarian hemorrhagic cyst. Pain control was difficult and patient was referred for admission and hydration.  Hospital Course:  Ms. Harkins was treated with pain control, antiemetics and IV fluids. Today she feels much better. Abdominal pain is minimal and she wants to go home, she feels she can manage at home. She has had a little bit of vomiting but overall tolerating diet and wants to go home. Repeat exam was unremarkable and she is stable for discharge.  1. Left lower quadrant abdominal pain secondary to hemorrhagic cyst: Pain appears nearly resolved. Overall she is tolerating a diet and wants to go home. 2. Nausea and vomiting, much improved. Discharged home on antiemetics. 3. Hypokalemia: Repleted. 4. History bipolar disorder: Appears stable. She does not take any psychiatric medications at this time. 5. Obstructive sleep apnea: Stable. 6. Hypertension: Stable.   Discharge Instructions  Discharge Orders   Future Orders Complete By Expires     Activity as  tolerated - No restrictions  As directed     Diet - low sodium heart healthy  As directed     Discharge instructions  As directed     Comments:      Be sure to followup with gynecologist as directed. Call your physician or seek immediate medical attention for recurrent vomiting, abdominal pain or worsening of condition.        Medication List    STOP taking these medications       albuterol 108 (90 BASE) MCG/ACT inhaler  Commonly known as:  PROVENTIL HFA;VENTOLIN HFA     benztropine 1 MG tablet  Commonly known as:  COGENTIN     clonazePAM 1 MG tablet  Commonly known as:  KLONOPIN     divalproex 500 MG 24 hr tablet  Commonly known as:  DEPAKOTE ER     DULoxetine 30 MG capsule  Commonly known as:  CYMBALTA     predniSONE 10 MG tablet  Commonly known as:  DELTASONE     QUEtiapine 100 MG tablet  Commonly known as:  SEROQUEL     ziprasidone 40 MG capsule  Commonly known as:  GEODON      TAKE these medications       amLODipine-benazepril 5-10 MG per capsule  Commonly known as:  LOTREL  Take 1 capsule by mouth daily.     hydrochlorothiazide 12.5 MG capsule  Commonly known as:  MICROZIDE  Take 12.5 mg by mouth every morning.     HYDROcodone-acetaminophen 5-325 MG per tablet  Commonly known as:  NORCO/VICODIN  Take 1 tablet by mouth every 4 (four) hours as  needed for pain.     ondansetron 4 MG tablet  Commonly known as:  ZOFRAN  Take 1 tablet (4 mg total) by mouth every 6 (six) hours as needed for nausea.       No Known Allergies  The results of significant diagnostics from this hospitalization (including imaging, microbiology, ancillary and laboratory) are listed below for reference.    Significant Diagnostic Studies: US Transvaginal Non-ob  12-20-2012   *RADIOLOGY REPORT*  Clinical Data:  Left lower quadrant pain.  Ovarian torsion. History partial hysterectomy.  TRANSABDOMINAL AND TRANSVAGINAL ULTRASOUND OF PELVIS DOPPLER ULTRASOUND OF OVARIES  Technique:  Both  transabdominal and transvaginal ultrasound examinations of the pelvis were performed. Transabdominal technique was performed for global imaging of the pelvis including uterus, ovaries, adnexal regions, and pelvic cul-de-sac.  It was necessary to proceed with endovaginal exam following the transabdominal exam to visualize the adnexa.  Color and duplex Doppler ultrasound was utilized to evaluate blood flow to the ovaries.  Comparison:  CT 12-21-2011.  FINDINGS  Uterus:  Hysterectomy.  Endometrium:  Hysterectomy.  Right ovary: 28 mm x 20 mm x 20 mm with normal arterial and venous waveforms.  Normal color flow.  Left ovary: Normal physiologic appearance measuring 31 mm x 20 mm x 17 mm.  Normal arterial and venous waveforms.  Normal color flow. 12 mm central echogenic region most compatible with hemorrhagic cyst.  Pulsed Doppler evaluation demonstrates normal low-resistance arterial and venous waveforms in both ovaries. IMPRESSION:  Hysterectomy.  12 mm echogenic lesion in the left ovary most likely represents hemorrhagic cyst.  Follow-up 12-week pelvic ultrasound recommended for reassessment.  No sonographic evidence for ovarian torsion.   Original Report Authenticated By: Andreas Newport, M.D.   Ct Abdomen Pelvis W Contrast  12/20/12   *RADIOLOGY REPORT*  Clinical Data: 58 year old female with left-sided abdominal and pelvic pain, nausea and vomiting.  CT ABDOMEN AND PELVIS WITH CONTRAST  Technique:  Multidetector CT imaging of the abdomen and pelvis was performed following the standard protocol during bolus administration of intravenous contrast.  Contrast:  100 ml intravenous Omnipaque-300  Comparison: 12/15/2010 CT  Findings: The liver, spleen, pancreas, adrenal glands, gallbladder and kidneys are unremarkable except for hepatic and right renal cysts.  No free fluid, enlarged lymph nodes, biliary dilation or abdominal aortic aneurysm identified. The bowel, appendix and bladder are unremarkable. The patient is  status post hysterectomy. There is no evidence of bowel obstruction, pneumoperitoneum or abscess.  No acute or suspicious bony abnormalities are identified.  IMPRESSION: No evidence of acute abnormality or identifiable cause for this patient's left abdominal/pelvic pain.   Original Report Authenticated By: Harmon Pier, M.D.   Microbiology: Recent Results (from the past 240 hour(s))  WET PREP, GENITAL     Status: Abnormal   Collection Time    12-20-12  1:16 PM      Result Value Range Status   Yeast Wet Prep HPF POC NONE SEEN  NONE SEEN Final   Trich, Wet Prep NONE SEEN  NONE SEEN Final   Clue Cells Wet Prep HPF POC NONE SEEN  NONE SEEN Final   WBC, Wet Prep HPF POC RARE (*) NONE SEEN Final  GC/CHLAMYDIA PROBE AMP     Status: None   Collection Time    December 20, 2012  1:16 PM      Result Value Range Status   CT Probe RNA NEGATIVE  NEGATIVE Final   GC Probe RNA NEGATIVE  NEGATIVE Final   Comment: (NOTE)                                                                                              **  Normal Reference Range: Negative**          Assay performed using the Gen-Probe APTIMA COMBO2 (R) Assay.     Acceptable specimen types for this assay include APTIMA Swabs (Unisex,     endocervical, urethral, or vaginal), first void urine, and ThinPrep     liquid based cytology samples.     Labs: Basic Metabolic Panel:  Recent Labs Lab 11/28/12 1001 11/28/12 1424 11/29/12 0541  NA 141  --  139  K 2.7* 2.7* 3.5  CL 101  --  102  CO2 25  --  26  GLUCOSE 103*  --  108*  BUN 16  --  8  CREATININE 0.83  --  0.75  CALCIUM 10.3  --  10.2  MG  --   --  2.0   Liver Function Tests:  Recent Labs Lab 11/28/12 1001  AST 18  ALT 23  ALKPHOS 117  BILITOT 0.3  PROT 8.7*  ALBUMIN 4.4    Recent Labs Lab 11/28/12 1001  LIPASE 32   CBC:  Recent Labs Lab 11/28/12 1001 11/29/12 0541  WBC 4.7 6.4  NEUTROABS 1.9  --   HGB 14.6 14.2  HCT 42.0 39.6  MCV 88.4 88.4  PLT 214 197     Principal Problem:   LLQ pain Active Problems:   Hemorrhagic cyst of ovary   Nausea with vomiting   Hypokalemia   Bipolar disorder, unspecified   Time coordinating discharge: 25 minutes  Signed:  Brendia Sacks, MD [facility] 11/29/2012, 1:50 PM

## 2012-12-07 ENCOUNTER — Emergency Department (HOSPITAL_COMMUNITY): Payer: Medicare Other

## 2012-12-07 ENCOUNTER — Encounter (HOSPITAL_COMMUNITY): Payer: Self-pay | Admitting: *Deleted

## 2012-12-07 ENCOUNTER — Emergency Department (HOSPITAL_COMMUNITY)
Admission: EM | Admit: 2012-12-07 | Discharge: 2012-12-07 | Disposition: A | Discharge status: Home / Self Care | Payer: Medicare Other | Attending: Emergency Medicine | Admitting: Emergency Medicine

## 2012-12-07 DIAGNOSIS — R112 Nausea with vomiting, unspecified: Secondary | ICD-10-CM | POA: Insufficient documentation

## 2012-12-07 DIAGNOSIS — Z8639 Personal history of other endocrine, nutritional and metabolic disease: Secondary | ICD-10-CM | POA: Insufficient documentation

## 2012-12-07 DIAGNOSIS — Z8739 Personal history of other diseases of the musculoskeletal system and connective tissue: Secondary | ICD-10-CM | POA: Insufficient documentation

## 2012-12-07 DIAGNOSIS — F411 Generalized anxiety disorder: Secondary | ICD-10-CM | POA: Insufficient documentation

## 2012-12-07 DIAGNOSIS — Z90710 Acquired absence of both cervix and uterus: Secondary | ICD-10-CM | POA: Insufficient documentation

## 2012-12-07 DIAGNOSIS — R350 Frequency of micturition: Secondary | ICD-10-CM | POA: Insufficient documentation

## 2012-12-07 DIAGNOSIS — Z8679 Personal history of other diseases of the circulatory system: Secondary | ICD-10-CM | POA: Insufficient documentation

## 2012-12-07 DIAGNOSIS — I10 Essential (primary) hypertension: Secondary | ICD-10-CM | POA: Insufficient documentation

## 2012-12-07 DIAGNOSIS — F17200 Nicotine dependence, unspecified, uncomplicated: Secondary | ICD-10-CM | POA: Insufficient documentation

## 2012-12-07 DIAGNOSIS — N83209 Unspecified ovarian cyst, unspecified side: Secondary | ICD-10-CM | POA: Insufficient documentation

## 2012-12-07 DIAGNOSIS — IMO0002 Reserved for concepts with insufficient information to code with codable children: Secondary | ICD-10-CM | POA: Insufficient documentation

## 2012-12-07 DIAGNOSIS — E669 Obesity, unspecified: Secondary | ICD-10-CM | POA: Insufficient documentation

## 2012-12-07 DIAGNOSIS — Z862 Personal history of diseases of the blood and blood-forming organs and certain disorders involving the immune mechanism: Secondary | ICD-10-CM | POA: Insufficient documentation

## 2012-12-07 DIAGNOSIS — Z79899 Other long term (current) drug therapy: Secondary | ICD-10-CM | POA: Insufficient documentation

## 2012-12-07 DIAGNOSIS — N83202 Unspecified ovarian cyst, left side: Secondary | ICD-10-CM

## 2012-12-07 DIAGNOSIS — Z8669 Personal history of other diseases of the nervous system and sense organs: Secondary | ICD-10-CM | POA: Insufficient documentation

## 2012-12-07 DIAGNOSIS — Z8659 Personal history of other mental and behavioral disorders: Secondary | ICD-10-CM | POA: Insufficient documentation

## 2012-12-07 DIAGNOSIS — R1032 Left lower quadrant pain: Secondary | ICD-10-CM | POA: Insufficient documentation

## 2012-12-07 DIAGNOSIS — E876 Hypokalemia: Secondary | ICD-10-CM | POA: Insufficient documentation

## 2012-12-07 LAB — CBC WITH DIFFERENTIAL/PLATELET
Basophils Relative: 0 % (ref 0–1)
Eosinophils Absolute: 0.1 10*3/uL (ref 0.0–0.7)
Eosinophils Relative: 1 % (ref 0–5)
Hemoglobin: 12.8 g/dL (ref 12.0–15.0)
Lymphs Abs: 2 10*3/uL (ref 0.7–4.0)
MCH: 30.7 pg (ref 26.0–34.0)
MCHC: 35.4 g/dL (ref 30.0–36.0)
MCV: 86.8 fL (ref 78.0–100.0)
Monocytes Relative: 4 % (ref 3–12)
Neutrophils Relative %: 55 % (ref 43–77)
Platelets: 220 10*3/uL (ref 150–400)

## 2012-12-07 LAB — URINALYSIS, ROUTINE W REFLEX MICROSCOPIC
Bilirubin Urine: NEGATIVE
Hgb urine dipstick: NEGATIVE
Ketones, ur: NEGATIVE mg/dL
Nitrite: NEGATIVE
Protein, ur: NEGATIVE mg/dL
Specific Gravity, Urine: 1.015 (ref 1.005–1.030)
Urobilinogen, UA: 0.2 mg/dL (ref 0.0–1.0)

## 2012-12-07 LAB — BASIC METABOLIC PANEL
BUN: 11 mg/dL (ref 6–23)
Calcium: 9.5 mg/dL (ref 8.4–10.5)
GFR calc Af Amer: 89 mL/min — ABNORMAL LOW (ref >90)
GFR calc non Af Amer: 77 mL/min — ABNORMAL LOW (ref >90)
Glucose, Bld: 81 mg/dL (ref 70–99)
Potassium: 2.6 mEq/L — CL (ref 3.5–5.1)

## 2012-12-07 MED ORDER — OXYCODONE-ACETAMINOPHEN 5-325 MG PO TABS: ORAL_TABLET | ORAL | Status: DC

## 2012-12-07 MED ORDER — METOCLOPRAMIDE HCL 5 MG/ML IJ SOLN
10.0000 mg | Freq: Once | INTRAMUSCULAR | Status: AC
  Administered 2012-12-07: 10 mg via INTRAVENOUS
  Filled 2012-12-07: qty 2

## 2012-12-07 MED ORDER — PROMETHAZINE HCL 25 MG PO TABS: 25.0000 mg | ORAL_TABLET | Freq: Three times a day (TID) | ORAL | Status: DC | PRN

## 2012-12-07 MED ORDER — IBUPROFEN 600 MG PO TABS: 600.0000 mg | ORAL_TABLET | Freq: Four times a day (QID) | ORAL | Status: DC | PRN

## 2012-12-07 MED ORDER — MORPHINE SULFATE 4 MG/ML IJ SOLN
4.0000 mg | Freq: Once | INTRAMUSCULAR | Status: AC
  Administered 2012-12-07: 4 mg via INTRAVENOUS
  Filled 2012-12-07: qty 1

## 2012-12-07 MED ORDER — ONDANSETRON HCL 4 MG/2ML IJ SOLN
4.0000 mg | Freq: Once | INTRAMUSCULAR | Status: AC
  Administered 2012-12-07: 4 mg via INTRAVENOUS
  Filled 2012-12-07: qty 2

## 2012-12-07 MED ORDER — POTASSIUM CHLORIDE CRYS ER 20 MEQ PO TBCR
40.0000 meq | EXTENDED_RELEASE_TABLET | Freq: Once | ORAL | Status: AC
  Administered 2012-12-07: 40 meq via ORAL
  Filled 2012-12-07: qty 2

## 2012-12-07 MED ORDER — SODIUM CHLORIDE 0.9 % IV SOLN
1000.0000 mL | INTRAVENOUS | Status: DC
  Administered 2012-12-07: 1000 mL via INTRAVENOUS

## 2012-12-07 MED ORDER — POTASSIUM CHLORIDE ER 20 MEQ PO TBCR: 20.0000 meq | EXTENDED_RELEASE_TABLET | Freq: Two times a day (BID) | ORAL | Status: DC

## 2012-12-07 MED ORDER — SODIUM CHLORIDE 0.9 % IV SOLN
1000.0000 mL | Freq: Once | INTRAVENOUS | Status: AC
  Administered 2012-12-07: 1000 mL via INTRAVENOUS

## 2012-12-07 MED ORDER — DIPHENHYDRAMINE HCL 50 MG/ML IJ SOLN
25.0000 mg | Freq: Once | INTRAMUSCULAR | Status: AC
  Administered 2012-12-07: 25 mg via INTRAVENOUS
  Filled 2012-12-07: qty 1

## 2012-12-07 NOTE — ED Notes (Signed)
Seen at [facility] 2 days ago, dx w/ovarian cyst and severe abdominal pain.  Con't. Pain LLQ.  No diarrhea, vomited x 1 in route.  Given Zofran and O2 in route.

## 2012-12-07 NOTE — ED Notes (Addendum)
Last BM prior to 11/28/2012.  Was d/c'ed from hospital of 6/12 per her insistance, although Dr. Irene Limbo advised her to stay d/t intractable nausea and vomiting.  States at that time pain was some better but not completely relieved.  Notes that when cramps hit, it radiates to rectum, feeling like she needs to have BM.  Bowel sounds are hypoactive.  Has been taking 2 Vicodin every 4 hours for pain

## 2012-12-07 NOTE — ED Notes (Signed)
CRITICAL VALUE ALERT  Critical value received:  Potassium 2.6  Date of notification:  12/07/2012  Time of notification:  1609  Critical value read back:yes  Nurse who received alert:  c Caedmon Louque rn  MD notified (1st page):  Dr Lynelle Doctor  Time of first page:  1609  MD notified (2nd page):  Time of second page:  Responding MD:  Dr Lynelle Doctor  Time MD responded:  [phone]

## 2012-12-07 NOTE — ED Notes (Signed)
Patient with no complaints at this time. Respirations even and unlabored. Skin warm/dry. Discharge instructions reviewed with patient at this time. Patient given opportunity to voice concerns/ask questions. IV removed per policy and band-aid applied to site. Patient discharged at this time and left Emergency Department with steady gait.  

## 2012-12-07 NOTE — ED Provider Notes (Signed)
History    This chart was scribed for Ward Givens, MD, by Frederik Pear, ED scribe. The patient was seen in room [facility] and the patient's care was started at 1258.    CSN: [id number]  Arrival date & time 12/07/12  1204   First MD Initiated Contact with Patient 12/07/12 1258      Chief Complaint  Patient presents with  . Abdominal Pain    (Consider location/radiation/quality/duration/timing/severity/associated sxs/prior treatment) The history is provided by the patient and medical records. No language interpreter was used.     HPI Comments: Ashley Patrick is a 57 y.o. female who presents to the Emergency Department complaining of worsening, 8/10, LLQ abdominal pain that intermittently radiates to her rectum and is 9/10 at its worst earlier today. States the pain has been lasting for a few hours and has been coming and going every day for the last month. This episode started earlier this morning and woke her up from sleep. She reports aggravation with positional changes and alleviation with nothing. She also reports associated emesis 4x since this morning, nausea, and increased urinary frequency. Denies diarrhea or bleeding in vomitus or stool. She denies fever, hematuria, dysuria. She treated the symptoms at home with 2 hydrocodone PTA with no relief. She was seen in the ED on 06/12 and suggested she be admitted to [facility] for LLQ abdominal pain, hypokalemia, and an ovarian cyst, however she wasn't admitted. Following her discharge, she made an appointment with Dr. Emelda Fear, GYN,  who is not able to see her until 07/09.   She is a current, everyday 0.5 pack a day smoker for the last 38 years who does not consume ETOH. She is on disability for her bipolar disorder.  PCP is Dr. Lowella Bandy.  Past Medical History  Diagnosis Date  . Back pain   . Obesity   . Nicotine addiction   . Psychosis   . Hypertension   . OD (overdose of drug)     hospitalized for 11 days   . Bipolar 1 disorder   .  Hypercholesteremia   . Migraine headache   . Lateral epicondylitis  of elbow     right  . Sleep apnea     STOP BANG score: 4    Past Surgical History  Procedure Laterality Date  . Partial hysterectomy    . Breast biopsy  10/18/2011    Procedure: BREAST BIOPSY;  Surgeon: Dalia Heading, MD;  Location: [facility];  Service: General;  Laterality: Left;    Family History  Problem Relation Age of Onset  . Anesthesia problems Neg Hx   . Hypotension Neg Hx   . Malignant hyperthermia Neg Hx   . Pseudochol deficiency Neg Hx     History  Substance Use Topics  . Smoking status: Current Every Day Smoker -- 0.50 packs/day for 38 years    Types: Cigarettes  . Smokeless tobacco: Current User  . Alcohol Use: No  lives at home Lives with spouse On disability for bipolar disorder  OB History   Grav Para Term Preterm Abortions TAB SAB Ect Mult Living                  Review of Systems  Gastrointestinal: Positive for nausea, vomiting and abdominal pain. Negative for diarrhea.  Genitourinary: Positive for frequency.  All other systems reviewed and are negative.    Allergies  Review of patient's allergies indicates no known allergies.  Home Medications   Current Outpatient Rx  Name  Route  Sig  Dispense  Refill  . amLODipine-benazepril (LOTREL) 5-10 MG per capsule   Oral   Take 1 capsule by mouth daily.         . hydrochlorothiazide (MICROZIDE) 12.5 MG capsule   Oral   Take 12.5 mg by mouth every morning.         Marland Kitchen HYDROcodone-acetaminophen (NORCO/VICODIN) 5-325 MG per tablet   Oral   Take 1 tablet by mouth every 4 (four) hours as needed for pain.   20 tablet   0   . ondansetron (ZOFRAN) 4 MG tablet   Oral   Take 1 tablet (4 mg total) by mouth every 6 (six) hours as needed for nausea.   20 tablet   0     BP 125/95  Pulse 116  Temp(Src) 98.3 F (36.8 C) (Oral)  Resp 32  Ht 5\' 4"  (1.626 m)  Wt 235 lb (106.595 kg)  BMI 40.32 kg/m2  SpO2 100%  Vital signs  normal except tachycardia   Physical Exam  Nursing note and vitals reviewed. Constitutional: She is oriented to person, place, and time. She appears well-developed and well-nourished.  Non-toxic appearance. She does not appear ill. No distress.  Appears uncomfortable and agitated due to pain.   HENT:  Head: Normocephalic and atraumatic.  Right Ear: External ear normal.  Left Ear: External ear normal.  Nose: Nose normal. No mucosal edema or rhinorrhea.  Mouth/Throat: Oropharynx is clear and moist and mucous membranes are normal. No dental abscesses or edematous.  Eyes: Conjunctivae and EOM are normal. Pupils are equal, round, and reactive to light.  Neck: Normal range of motion and full passive range of motion without pain. Neck supple.  Cardiovascular: Normal rate, regular rhythm and normal heart sounds.  Exam reveals no gallop and no friction rub.   No murmur heard. Pulmonary/Chest: Effort normal and breath sounds normal. She has no wheezes. She has no rhonchi. She has no rales. She exhibits no tenderness and no crepitus.  Hyperventilating due to pain.  Abdominal: Soft. Normal appearance and bowel sounds are normal. She exhibits no distension. There is tenderness. There is no rebound and no guarding.  Tender to palpation over the LLQ.  Musculoskeletal: Normal range of motion. She exhibits no edema and no tenderness.  Moves all extremities well.   Neurological: She is alert and oriented to person, place, and time. She has normal strength. No cranial nerve deficit.  Skin: Skin is warm, dry and intact. No rash noted. No erythema. No pallor.  Psychiatric: Her speech is normal. Her mood appears anxious. She is agitated.    ED Course  Procedures (including critical care time)  Medications  0.9 %  sodium chloride infusion (0 mLs Intravenous Stopped 12/07/12 1409)    Followed by  0.9 %  sodium chloride infusion (1,000 mLs Intravenous New Bag/Given 12/07/12 1409)  potassium chloride SA  (K-DUR,KLOR-CON) CR tablet 40 mEq (not administered)  morphine 4 MG/ML injection 4 mg (4 mg Intravenous Given 12/07/12 1338)  ondansetron (ZOFRAN) injection 4 mg (4 mg Intravenous Given 12/07/12 1338)  metoCLOPramide (REGLAN) injection 10 mg (10 mg Intravenous Given 12/07/12 1338)  diphenhydrAMINE (BENADRYL) injection 25 mg (25 mg Intravenous Given 12/07/12 1338)  morphine 4 MG/ML injection 4 mg (4 mg Intravenous Given 12/07/12 1553)      DIAGNOSTIC STUDIES: Oxygen Saturation is 100% on room air, normal by my interpretation.    COORDINATION OF CARE:  13:25- Discussed planned course of treatment with the patient,  including Zofran, Benadryl, Reglan, Morphine, US Transvaginal, [place] Art/Ven Flow Doppler, and [place] limited pelvis, who is agreeable at this time.  15:20- Upon recheck, she is feeling much better other than the pain associated with the [place].  Patient presents with persistent left lower quadrant abdominal pain with prior normal CT scan of the abdomen and pelvis ultrasound showing possible small cyst on her left ovary. Patient is noted to be hypokalemic and has been mainly hypokalemic on most of her prior laboratory tests in the past year. I discussed with patient that her low potassium could be making her abdominal pain worse. She also may need to stay on potassium supplementation as long she takes hydrochlorothiazide for her hypertension. She is advised to followup with Dr. Emelda Fear for this possible ovarian cyst and followup with her PCP about her hypokalemia.  Results for orders placed during the hospital encounter of 12/07/12  CBC WITH DIFFERENTIAL      Result Value Range   WBC 5.1  4.0 - 10.5 K/uL   RBC 4.17  3.87 - 5.11 MIL/uL   Hemoglobin 12.8  12.0 - 15.0 g/dL   HCT 21.3  08.6 - 57.8 %   MCV 86.8  78.0 - 100.0 fL   MCH 30.7  26.0 - 34.0 pg   MCHC 35.4  30.0 - 36.0 g/dL   RDW 46.9  62.9 - 52.8 %   Platelets 220  150 - 400 K/uL   Neutrophils Relative % 55  43 - 77 %   Neutro Abs  2.8  1.7 - 7.7 K/uL   Lymphocytes Relative 39  12 - 46 %   Lymphs Abs 2.0  0.7 - 4.0 K/uL   Monocytes Relative 4  3 - 12 %   Monocytes Absolute 0.2  0.1 - 1.0 K/uL   Eosinophils Relative 1  0 - 5 %   Eosinophils Absolute 0.1  0.0 - 0.7 K/uL   Basophils Relative 0  0 - 1 %   Basophils Absolute 0.0  0.0 - 0.1 K/uL  BASIC METABOLIC PANEL      Result Value Range   Sodium 142  135 - 145 mEq/L   Potassium 2.6 (*) 3.5 - 5.1 mEq/L   Chloride 103  96 - 112 mEq/L   CO2 28  19 - 32 mEq/L   Glucose, Bld 81  70 - 99 mg/dL   BUN 11  6 - 23 mg/dL   Creatinine, Ser 4.13  0.50 - 1.10 mg/dL   Calcium 9.5  8.4 - 24.4 mg/dL   GFR calc non Af Amer 77 (*) >90 mL/min   GFR calc Af Amer 89 (*) >90 mL/min   Laboratory interpretation all normal except hypokalemia  US Transvaginal Non-ob  US Pelvis Complete  US Art/ven Flow Abd Pelv Doppler  12/07/2012   *RADIOLOGY REPORT*  Clinical Data:  Left lower quadrant pain.  Known cyst.  Partial hysterectomy.  TRANSABDOMINAL ULTRASOUND OF PELVIS DOPPLER ULTRASOUND OF OVARIES  Technique:  Transabdominal ultrasound examination of the pelvis was performed including evaluation of the uterus, ovaries, adnexal regions, and pelvic cul-de-sac.  Color and duplex Doppler ultrasound was utilized to evaluate blood flow to the ovaries.  Comparison:  TUltrasound of 11/28/2012, CT 11/28/2012  Findings:  Uterus:  Surgically removed  Endometrium:  Surgically removed  Right ovary:  Normal in size at 2.3 x 2.1 x 2.2 cm for a post menopausal female.  Left ovary:  Normal in size at 3.1 x 1.4 x 2.0 cm.  There is  a small 1.3 x 1.0 x 0.7 cm echogenic focus within the ovary which is seen on comparison exam and not significantly changed.  Pulsed Doppler evaluation demonstrates normal low-resistance arterial and venous waveforms in both ovaries.  IMPRESSION:  1.  Normal ovaries for age. 2.  Normal color Doppler flow to the left and right ovary. 3.  Small echogenic 1 cm lesion within the left ovary  is unchanged from prior and likely benign. 4.  Hysterectomy.   Original Report Authenticated By: Genevive Bi, M.D.     US Transvaginal Non-ob US Pelvis Complete US Art/ven Flow Abd Pelv Doppler  11/28/2012  .  FINDINGS  Uterus:  Hysterectomy.  Endometrium:  Hysterectomy.  Right ovary: 28 mm x 20 mm x 20 mm with normal arterial and venous waveforms.  Normal color flow.  Left ovary: Normal physiologic appearance measuring 31 mm x 20 mm x 17 mm.  Normal arterial and venous waveforms.  Normal color flow. 12 mm central echogenic region most compatible with hemorrhagic cyst.  Pulsed Doppler evaluation demonstrates normal low-resistance arterial and venous waveforms in both ovaries.  IMPRESSION:  Hysterectomy.  12 mm echogenic lesion in the left ovary most likely represents hemorrhagic cyst.  Follow-up 12-week pelvic ultrasound recommended for reassessment.  No sonographic evidence for ovarian torsion.   Original Report Authenticated By: Andreas Newport, M.D.   Ct Abdomen Pelvis W Contrast  11/28/2012     IMPRESSION: No evidence of acute abnormality or identifiable cause for this patient's left abdominal/pelvic pain.   Original Report Authenticated By: Harmon Pier, M.D.      1. Hypokalemia   2. LLQ abdominal pain   3. Ovarian cyst, left      New Prescriptions   IBUPROFEN (ADVIL,MOTRIN) 600 MG TABLET    Take 1 tablet (600 mg total) by mouth every 6 (six) hours as needed for pain.   OXYCODONE-ACETAMINOPHEN (PERCOCET/ROXICET) 5-325 MG PER TABLET    Take 1 or 2 po Q 6hrs for pain   POTASSIUM CHLORIDE 20 MEQ TBCR    Take 20 mEq by mouth 2 (two) times daily.   PROMETHAZINE (PHENERGAN) 25 MG TABLET    Take 1 tablet (25 mg total) by mouth every 8 (eight) hours as needed for nausea (or abdominal cramping).    Plan discharge   Devoria Albe, MD, FACEP    MDM  I personally performed the services described in this documentation, which was scribed in my presence. The recorded information has been  reviewed and considered.  Devoria Albe, MD, Armando Gang          Ward Givens, MD 12/07/12 [phone]

## 2012-12-25 ENCOUNTER — Ambulatory Visit (INDEPENDENT_AMBULATORY_CARE_PROVIDER_SITE_OTHER): Payer: Medicare Other | Admitting: Obstetrics and Gynecology

## 2012-12-25 ENCOUNTER — Inpatient Hospital Stay (HOSPITAL_COMMUNITY)
Admission: AD | Admit: 2012-12-25 | Discharge: 2012-12-27 | DRG: 392 | Disposition: A | Discharge status: Home / Self Care | Payer: Medicare Other | Source: Ambulatory Visit | Attending: Obstetrics and Gynecology | Admitting: Obstetrics and Gynecology

## 2012-12-25 ENCOUNTER — Encounter: Payer: Self-pay | Admitting: Obstetrics and Gynecology

## 2012-12-25 ENCOUNTER — Other Ambulatory Visit: Payer: Self-pay | Admitting: Obstetrics and Gynecology

## 2012-12-25 ENCOUNTER — Inpatient Hospital Stay (HOSPITAL_COMMUNITY): Payer: Medicare Other

## 2012-12-25 ENCOUNTER — Encounter (HOSPITAL_COMMUNITY): Payer: Self-pay | Admitting: *Deleted

## 2012-12-25 ENCOUNTER — Ambulatory Visit (HOSPITAL_COMMUNITY)
Admission: RE | Admit: 2012-12-25 | Discharge: 2012-12-25 | Disposition: A | Discharge status: Home / Self Care | Payer: Medicare Other | Source: Ambulatory Visit | Attending: Obstetrics and Gynecology | Admitting: Obstetrics and Gynecology

## 2012-12-25 VITALS — BP 190/100 | Ht 66.0 in | Wt 196.5 lb

## 2012-12-25 DIAGNOSIS — K2210 Ulcer of esophagus without bleeding: Secondary | ICD-10-CM

## 2012-12-25 DIAGNOSIS — R52 Pain, unspecified: Secondary | ICD-10-CM

## 2012-12-25 DIAGNOSIS — K429 Umbilical hernia without obstruction or gangrene: Secondary | ICD-10-CM

## 2012-12-25 DIAGNOSIS — R109 Unspecified abdominal pain: Secondary | ICD-10-CM

## 2012-12-25 DIAGNOSIS — R112 Nausea with vomiting, unspecified: Secondary | ICD-10-CM

## 2012-12-25 DIAGNOSIS — K210 Gastro-esophageal reflux disease with esophagitis: Secondary | ICD-10-CM

## 2012-12-25 DIAGNOSIS — R1013 Epigastric pain: Secondary | ICD-10-CM

## 2012-12-25 DIAGNOSIS — K208 Other esophagitis without bleeding: Principal | ICD-10-CM | POA: Diagnosis present

## 2012-12-25 DIAGNOSIS — G4730 Sleep apnea, unspecified: Secondary | ICD-10-CM | POA: Diagnosis present

## 2012-12-25 DIAGNOSIS — E669 Obesity, unspecified: Secondary | ICD-10-CM | POA: Diagnosis present

## 2012-12-25 DIAGNOSIS — F17200 Nicotine dependence, unspecified, uncomplicated: Secondary | ICD-10-CM | POA: Diagnosis present

## 2012-12-25 DIAGNOSIS — K5900 Constipation, unspecified: Secondary | ICD-10-CM | POA: Diagnosis present

## 2012-12-25 DIAGNOSIS — E7800 Pure hypercholesterolemia, unspecified: Secondary | ICD-10-CM | POA: Diagnosis present

## 2012-12-25 DIAGNOSIS — G8929 Other chronic pain: Secondary | ICD-10-CM | POA: Diagnosis present

## 2012-12-25 DIAGNOSIS — I10 Essential (primary) hypertension: Secondary | ICD-10-CM | POA: Diagnosis present

## 2012-12-25 DIAGNOSIS — K449 Diaphragmatic hernia without obstruction or gangrene: Secondary | ICD-10-CM | POA: Diagnosis present

## 2012-12-25 DIAGNOSIS — F319 Bipolar disorder, unspecified: Secondary | ICD-10-CM | POA: Diagnosis present

## 2012-12-25 DIAGNOSIS — K2960 Other gastritis without bleeding: Secondary | ICD-10-CM

## 2012-12-25 LAB — COMPREHENSIVE METABOLIC PANEL
AST: 16 U/L (ref 0–37)
CO2: 27 mEq/L (ref 19–32)
Calcium: 10.5 mg/dL (ref 8.4–10.5)
Creatinine, Ser: 0.75 mg/dL (ref 0.50–1.10)
GFR calc Af Amer: >90 mL/min (ref >90)
GFR calc non Af Amer: >90 mL/min (ref >90)
Total Protein: 8 g/dL (ref 6.0–8.3)

## 2012-12-25 LAB — URINALYSIS, ROUTINE W REFLEX MICROSCOPIC
Glucose, UA: NEGATIVE mg/dL
Leukocytes, UA: NEGATIVE
Protein, ur: NEGATIVE mg/dL
Urobilinogen, UA: 0.2 mg/dL (ref 0.0–1.0)

## 2012-12-25 LAB — CBC WITH DIFFERENTIAL/PLATELET
Basophils Absolute: 0 10*3/uL (ref 0.0–0.1)
Eosinophils Absolute: 0.1 10*3/uL (ref 0.0–0.7)
Eosinophils Relative: 1 % (ref 0–5)
HCT: 37 % (ref 36.0–46.0)
Lymphocytes Relative: 43 % (ref 12–46)
MCH: 30.8 pg (ref 26.0–34.0)
MCHC: 35.7 g/dL (ref 30.0–36.0)
MCV: 86.2 fL (ref 78.0–100.0)
Monocytes Absolute: 0.3 10*3/uL (ref 0.1–1.0)
RDW: 13.7 % (ref 11.5–15.5)
WBC: 5.9 10*3/uL (ref 4.0–10.5)

## 2012-12-25 LAB — LIPASE, BLOOD: Lipase: 39 U/L (ref 11–59)

## 2012-12-25 MED ORDER — HYDROMORPHONE HCL PF 1 MG/ML IJ SOLN
1.0000 mg | INTRAMUSCULAR | Status: DC | PRN
  Administered 2012-12-25 – 2012-12-27 (×8): 1 mg via INTRAVENOUS
  Filled 2012-12-25 (×8): qty 1

## 2012-12-25 MED ORDER — PROMETHAZINE HCL 25 MG/ML IJ SOLN
12.5000 mg | Freq: Four times a day (QID) | INTRAMUSCULAR | Status: DC | PRN
  Administered 2012-12-25 – 2012-12-26 (×2): 12.5 mg via INTRAVENOUS
  Filled 2012-12-25 (×2): qty 1

## 2012-12-25 MED ORDER — HYDROMORPHONE HCL PF 1 MG/ML IJ SOLN
1.0000 mg | Freq: Once | INTRAMUSCULAR | Status: AC
  Administered 2012-12-25: 1 mg via INTRAVENOUS

## 2012-12-25 MED ORDER — IOHEXOL 300 MG/ML  SOLN
100.0000 mL | Freq: Once | INTRAMUSCULAR | Status: AC | PRN
  Administered 2012-12-25: 100 mL via INTRAVENOUS

## 2012-12-25 MED ORDER — PANTOPRAZOLE SODIUM 40 MG IV SOLR
40.0000 mg | INTRAVENOUS | Status: DC
  Administered 2012-12-25: 40 mg via INTRAVENOUS
  Filled 2012-12-25 (×2): qty 40

## 2012-12-25 MED ORDER — HYDROMORPHONE HCL PF 1 MG/ML IJ SOLN
INTRAMUSCULAR | Status: AC
  Filled 2012-12-25: qty 1

## 2012-12-25 MED ORDER — KCL IN DEXTROSE-NACL 20-5-0.45 MEQ/L-%-% IV SOLN
INTRAVENOUS | Status: DC
  Administered 2012-12-25 – 2012-12-27 (×4): via INTRAVENOUS

## 2012-12-25 MED ORDER — PRENATAL MULTIVITAMIN CH
1.0000 | ORAL_TABLET | Freq: Every day | ORAL | Status: DC
  Administered 2012-12-27: 1 via ORAL
  Filled 2012-12-25 (×3): qty 1

## 2012-12-25 MED ORDER — ONDANSETRON HCL 4 MG PO TABS: 4.0000 mg | ORAL_TABLET | Freq: Four times a day (QID) | ORAL | Status: DC | PRN

## 2012-12-25 MED ORDER — ONDANSETRON HCL 4 MG/2ML IJ SOLN
4.0000 mg | Freq: Four times a day (QID) | INTRAMUSCULAR | Status: DC | PRN
  Administered 2012-12-25 (×2): 4 mg via INTRAVENOUS
  Filled 2012-12-25 (×2): qty 2

## 2012-12-25 MED ORDER — IOHEXOL 300 MG/ML  SOLN
50.0000 mL | Freq: Once | INTRAMUSCULAR | Status: AC | PRN
  Administered 2012-12-25: 50 mL via ORAL

## 2012-12-25 NOTE — Progress Notes (Signed)
12/25/12 1841 Patient has K+ level of 2.8 this evening. Notified Dr. Emelda Fear. Currently has D51/2NS with 20 mEq KCL IV fluids. Stated will take a look and evaluate need for further KCL replacement. Notified CT completed, results pending. Earnstine Regal, RN

## 2012-12-25 NOTE — Progress Notes (Signed)
Patient ID: Ashley Patrick, female   DOB: [birth year], 57 y.o.   MRN: [id number]  Pt here today to follow up from the ER for an ovarian cyst. Pt states she is in terrible pain. She states her whole stomach hurts. Pt has had [place] at [facility]. Physical exam notable for hyperactive bowel sounds , generalized tremulousness with hR tachycardic to 132, and a seemingyly reasonalble patient , so pt will be admitted for eval due to acute nature of tthe abd pain.

## 2012-12-25 NOTE — Consult Note (Signed)
Referring Provider: Tilda Burrow, MD Primary Care Physician:  Geraldo Pitter, MD Primary Gastroenterologist:  Roetta Sessions, MD  Reason for Consultation: abdominal pain  HPI: Ashley Patrick is a 57 y.o. female admitted with severe abdominal pain. She was a direct admit from Dr. Rayna Sexton office. Patient most recently seen on 12/07/2012 in the emergency department. Admitted 11/28/2012 through 11/29/2012 with abdominal pain. Left lower quadrant pain at that time. She had a 12 mm echogenic lesion in the left there remains consistent with hemorrhagic cyst, seen on ultrasound. CT of the abdomen pelvis was unremarkable (11/28/12).  Followup pelvic ultrasound 12/07/2012 showed small echogenic 1 cm lesion within the left ovary unchanged from prior study and thought to be benign.  Today's labs are pending.  States she went home on 11/29/12 with persistent abdominal pain and vomiting but really didn't want to stay in the hospital. Pain more generalized to upper now. N/V every few days when eats solid foods. Tolerates liquids usually but no food. Weight down from 240 lb about 3 months ago. Documented weight on 11/28/12 was 205lb. Gets pain to have BM but when sits on commode only a small amount of stool comes out. Wipes blood. BMs soft. No melena. No hematemesis. No heartburn. No dysphagia. No dysuria, vaginal discharge. At baseline, has good BMs and no chronic abdominal pain. No prior EGD/TCS. No ASA/NSAIDs. No known injury, back pain.     Prior to Admission medications   Medication Sig Start Date End Date Taking? Authorizing Provider  amLODipine-benazepril (LOTREL) 5-10 MG per capsule Take 1 capsule by mouth daily.    Historical Provider, MD  hydrochlorothiazide (MICROZIDE) 12.5 MG capsule Take 12.5 mg by mouth every morning.    Historical Provider, MD    Current Facility-Administered Medications  Medication Dose Route Frequency Provider Last Rate Last Dose  . dextrose 5 % and 0.45 % NaCl with KCl 20  mEq/L infusion   Intravenous Continuous Tilda Burrow, MD      . HYDROmorphone (DILAUDID) 1 MG/ML injection           . HYDROmorphone (DILAUDID) injection 1 mg  1 mg Intravenous Q2H PRN Tilda Burrow, MD   1 mg at 12/25/12 1526  . iohexol (OMNIPAQUE) 300 MG/ML solution 50 mL  50 mL Oral Once PRN Medication Radiologist, MD      . ondansetron (ZOFRAN) tablet 4 mg  4 mg Oral Q6H PRN Tilda Burrow, MD       Or  . ondansetron [facility]) injection 4 mg  4 mg Intravenous Q6H PRN Tilda Burrow, MD      . prenatal multivitamin tablet 1 tablet  1 tablet Oral Daily Tilda Burrow, MD        Allergies as of 12/25/2012  . (No Known Allergies)    Past Medical History  Diagnosis Date  . Back pain   . Obesity   . Nicotine addiction   . Psychosis   . Hypertension   . OD (overdose of drug)     hospitalized for 11 days   . Bipolar 1 disorder   . Hypercholesteremia   . Migraine headache   . Lateral epicondylitis  of elbow     right  . Sleep apnea     STOP BANG score: 4    Past Surgical History  Procedure Laterality Date  . Partial hysterectomy    . Breast biopsy  10/18/2011    Procedure: BREAST BIOPSY;  Surgeon: Dalia Heading, MD;  Location:  [facility];  Service: General;  Laterality: Left;    Family History  Problem Relation Age of Onset  . Anesthesia problems Neg Hx   . Hypotension Neg Hx   . Malignant hyperthermia Neg Hx   . Pseudochol deficiency Neg Hx     History   Social History  . Marital Status: Legally Separated    Spouse Name: N/A    Number of Children: 3  . Years of Education: N/A   Occupational History  . Not on file.   Social History Main Topics  . Smoking status: Current Every Day Smoker -- 0.50 packs/day for 38 years    Types: Cigarettes  . Smokeless tobacco: Current User  . Alcohol Use: No  . Drug Use: No  . Sexually Active: Yes    Birth Control/ Protection: Surgical   Other Topics Concern  . Not on file   Social History Narrative  . No narrative  on file     ROS:  General: Negative for  fever, chills, fatigue, weakness. See HPI. Eyes: Negative for vision changes.  ENT: Negative for hoarseness, difficulty swallowing , nasal congestion. CV: Negative for chest pain, angina, palpitations, dyspnea on exertion, peripheral edema.  Respiratory: Negative for dyspnea at rest, dyspnea on exertion, cough, sputum, wheezing.  GI: See history of present illness. GU:  Negative for dysuria, hematuria, urinary incontinence, urinary frequency, nocturnal urination.  MS: Negative for joint pain, low back pain.  Derm: Negative for rash or itching.  Neuro: Negative for weakness, abnormal sensation, seizure, frequent headaches, memory loss, confusion.  Psych: Negative for anxiety, depression, suicidal ideation, hallucinations.  Endo: see hpi Heme: Negative for bruising or bleeding. Allergy: Negative for rash or hives.       Physical Examination: Vital signs in last 24 hours: Temp:  [98.6 F (37 C)] 98.6 F (37 C) (07/09 1512) Pulse Rate:  [99] 99 (07/09 1512) Resp:  [20] 20 (07/09 1512) BP: (151-190)/(96-100) 151/96 mmHg (07/09 1512) SpO2:  [100 %] 100 % (07/09 1512) Weight:  [193 lb (87.544 kg)-196 lb 8 oz (89.132 kg)] 193 lb (87.544 kg) (07/09 1512)    General: Well-nourished, well-developed. Appear uncomfortable.  Head: Normocephalic, atraumatic.   Eyes: Conjunctiva pink, no icterus. Mouth: Oropharyngeal mucosa moist and pink , no lesions erythema or exudate. Neck: Supple without thyromegaly, masses, or lymphadenopathy.  Lungs: Clear to auscultation bilaterally.  Heart: Regular rate and rhythm, no murmurs rubs or gallops.  Abdomen: Bowel sounds are normal,moderate epigastric tenderness as well as diffuse tenderness, nondistended, no hepatosplenomegaly or masses, no abdominal bruits or    hernia , no guarding. Some rebound.   Rectal: not performed Extremities: No lower extremity edema, clubbing, deformity.  Neuro: Alert and oriented x 4  , grossly normal neurologically.  Skin: Warm and dry, no rash or jaundice.   Psych: Alert and cooperative, normal mood and affect.        Intake/Output from previous day:   Intake/Output this shift:    Lab Results: CBC No results found for this basename: WBC, HGB, HCT, MCV, PLT,  in the last 72 hours BMET No results found for this basename: NA, K, CL, CO2, GLUCOSE, BUN, CREATININE, CALCIUM,  in the last 72 hours LFT No results found for this basename: BILITOT, BILIDIR, IBILI, ALKPHOS, AST, ALT, PROT, ALBUMIN,  in the last 72 hours   PT/INR No results found for this basename: LABPROT, INR,  in the last 72 hours    Imaging Studies: US Transvaginal Non-ob  2013/01/05   *  RADIOLOGY REPORT*  Clinical Data:  Left lower quadrant pain.  Known cyst.  Partial hysterectomy.  TRANSABDOMINAL ULTRASOUND OF PELVIS DOPPLER ULTRASOUND OF OVARIES  Technique:  Transabdominal ultrasound examination of the pelvis was performed including evaluation of the uterus, ovaries, adnexal regions, and pelvic cul-de-sac.  Color and duplex Doppler ultrasound was utilized to evaluate blood flow to the ovaries.  Comparison:  TUltrasound of 11/28/2012, CT 11/28/2012  Findings:  Uterus:  Surgically removed  Endometrium:  Surgically removed  Right ovary:  Normal in size at 2.3 x 2.1 x 2.2 cm for a post menopausal female.  Left ovary:  Normal in size at 3.1 x 1.4 x 2.0 cm.  There is a small 1.3 x 1.0 x 0.7 cm echogenic focus within the ovary which is seen on comparison exam and not significantly changed.  Pulsed Doppler evaluation demonstrates normal low-resistance arterial and venous waveforms in both ovaries.  IMPRESSION:  1.  Normal ovaries for age. 2.  Normal color Doppler flow to the left and right ovary. 3.  Small echogenic 1 cm lesion within the left ovary is unchanged from prior and likely benign. 4.  Hysterectomy.   Original Report Authenticated By: Genevive Bi, M.D.   US Transvaginal Non-ob  11/28/2012   *RADIOLOGY  REPORT*  Clinical Data:  Left lower quadrant pain.  Ovarian torsion. History partial hysterectomy.  TRANSABDOMINAL AND TRANSVAGINAL ULTRASOUND OF PELVIS DOPPLER ULTRASOUND OF OVARIES  Technique:  Both transabdominal and transvaginal ultrasound examinations of the pelvis were performed. Transabdominal technique was performed for global imaging of the pelvis including uterus, ovaries, adnexal regions, and pelvic cul-de-sac.  It was necessary to proceed with endovaginal exam following the transabdominal exam to visualize the adnexa.  Color and duplex Doppler ultrasound was utilized to evaluate blood flow to the ovaries.  Comparison:  CT 11/29/2011.  FINDINGS  Uterus:  Hysterectomy.  Endometrium:  Hysterectomy.  Right ovary: 28 mm x 20 mm x 20 mm with normal arterial and venous waveforms.  Normal color flow.  Left ovary: Normal physiologic appearance measuring 31 mm x 20 mm x 17 mm.  Normal arterial and venous waveforms.  Normal color flow. 12 mm central echogenic region most compatible with hemorrhagic cyst.  Pulsed Doppler evaluation demonstrates normal low-resistance arterial and venous waveforms in both ovaries.  IMPRESSION:  Hysterectomy.  12 mm echogenic lesion in the left ovary most likely represents hemorrhagic cyst.  Follow-up 12-week pelvic ultrasound recommended for reassessment.  No sonographic evidence for ovarian torsion.   Original Report Authenticated By: Andreas Newport, M.D.     Ct Abdomen Pelvis W Contrast  11/28/2012   *RADIOLOGY REPORT*  Clinical Data: 57 year old female with left-sided abdominal and pelvic pain, nausea and vomiting.  CT ABDOMEN AND PELVIS WITH CONTRAST  Technique:  Multidetector CT imaging of the abdomen and pelvis was performed following the standard protocol during bolus administration of intravenous contrast.  Contrast:  100 ml intravenous Omnipaque-300  Comparison: 12/15/2010 CT  Findings: The liver, spleen, pancreas, adrenal glands, gallbladder and kidneys are unremarkable  except for hepatic and right renal cysts.  No free fluid, enlarged lymph nodes, biliary dilation or abdominal aortic aneurysm identified. The bowel, appendix and bladder are unremarkable. The patient is status post hysterectomy. There is no evidence of bowel obstruction, pneumoperitoneum or abscess.  No acute or suspicious bony abnormalities are identified.  IMPRESSION: No evidence of acute abnormality or identifiable cause for this patient's left abdominal/pelvic pain.   Original Report Authenticated By: Harmon Pier, M.D.    Impression:  57 y/o female with several week h/o abdominal pain, vomiting. Now more generalized abdominal pain, persistent vomiting. Previously LLQ and thought to be secondary to hemorrhagic left ovarian cyst. Agree with repeat CT A/P for acute abdomen. Doubt ovarian etiology given recent f/u ultrasound and more generalized abdominal pain. If CT negative, would consider upper endoscopy +/- gallbladder work-up.   Plan: 1. F/U pending labs. Added lipase. 2. F/U CT A/P 3. NPO. 4. PPI.  I would like to thank Dr. Emelda Fear for allowing [place] to take part in the care of this nice patient.    LOS: 0 days   Tana Coast  12/25/2012, 3:26 PM  Attending note:  Patient seen and examined; chart reviewed  As outlined above.  Somewhat migratory abdominal pain in association with post-prandial N/V  and reported weight loss. Ongoing symptoms necessitates further evaluation;  Agree with repeat cross-sectional imaging.- if negative, will proceed with an EGD tomorrow.

## 2012-12-26 ENCOUNTER — Encounter (HOSPITAL_COMMUNITY)
Admission: AD | Disposition: A | Discharge status: Home / Self Care | Payer: Self-pay | Source: Ambulatory Visit | Attending: Obstetrics and Gynecology

## 2012-12-26 HISTORY — PX: ESOPHAGOGASTRODUODENOSCOPY: SHX5428

## 2012-12-26 LAB — MAGNESIUM: Magnesium: 1.8 mg/dL (ref 1.5–2.5)

## 2012-12-26 LAB — BASIC METABOLIC PANEL
Calcium: 9.6 mg/dL (ref 8.4–10.5)
Creatinine, Ser: 0.84 mg/dL (ref 0.50–1.10)
GFR calc Af Amer: 89 mL/min — ABNORMAL LOW (ref >90)

## 2012-12-26 SURGERY — EGD (ESOPHAGOGASTRODUODENOSCOPY)
Anesthesia: Moderate Sedation

## 2012-12-26 MED ORDER — POLYETHYLENE GLYCOL 3350 17 G PO PACK
17.0000 g | PACK | Freq: Every day | ORAL | Status: DC
  Administered 2012-12-27: 17 g via ORAL
  Filled 2012-12-26: qty 1

## 2012-12-26 MED ORDER — PANTOPRAZOLE SODIUM 40 MG PO TBEC
40.0000 mg | DELAYED_RELEASE_TABLET | Freq: Two times a day (BID) | ORAL | Status: DC
  Administered 2012-12-27: 40 mg via ORAL
  Filled 2012-12-26: qty 1

## 2012-12-26 MED ORDER — MIDAZOLAM HCL 5 MG/5ML IJ SOLN
INTRAMUSCULAR | Status: DC | PRN
  Administered 2012-12-26 (×2): 2 mg via INTRAVENOUS

## 2012-12-26 MED ORDER — STERILE WATER FOR IRRIGATION IR SOLN
Status: DC | PRN
  Administered 2012-12-26: 18:00:00

## 2012-12-26 MED ORDER — POTASSIUM CHLORIDE 10 MEQ/100ML IV SOLN
INTRAVENOUS | Status: AC
  Filled 2012-12-26: qty 100

## 2012-12-26 MED ORDER — MEPERIDINE HCL 100 MG/ML IJ SOLN
INTRAMUSCULAR | Status: AC
  Filled 2012-12-26: qty 2

## 2012-12-26 MED ORDER — ONDANSETRON HCL 4 MG/2ML IJ SOLN
INTRAMUSCULAR | Status: AC
  Filled 2012-12-26: qty 2

## 2012-12-26 MED ORDER — MIDAZOLAM HCL 5 MG/5ML IJ SOLN
INTRAMUSCULAR | Status: AC
  Filled 2012-12-26: qty 10

## 2012-12-26 MED ORDER — SODIUM CHLORIDE 0.9 % IV SOLN
INTRAVENOUS | Status: DC
  Administered 2012-12-26: 16:00:00 via INTRAVENOUS

## 2012-12-26 MED ORDER — MEPERIDINE HCL 100 MG/ML IJ SOLN
INTRAMUSCULAR | Status: DC | PRN
  Administered 2012-12-26 (×2): 50 mg via INTRAVENOUS

## 2012-12-26 MED ORDER — POTASSIUM CHLORIDE 10 MEQ/100ML IV SOLN
10.0000 meq | INTRAVENOUS | Status: AC
  Administered 2012-12-26: 10 meq via INTRAVENOUS
  Filled 2012-12-26: qty 100

## 2012-12-26 MED ORDER — POTASSIUM CHLORIDE 10 MEQ/100ML IV SOLN
INTRAVENOUS | Status: AC
  Administered 2012-12-26: 10 meq
  Filled 2012-12-26: qty 100

## 2012-12-26 NOTE — Care Management Note (Signed)
    Page 1 of 1   12/27/2012     7:38:39 AM   CARE MANAGEMENT NOTE 12/27/2012  Patient:  Ashley Patrick, Ashley Patrick   Account Number:  [id number]  Date Initiated:  12/26/2012  Documentation initiated by:  Sharrie Rothman  Subjective/Objective Assessment:   Pt admitted from home with abd pain. Pt lives alone and will return home at discharge. Pt is independent with ADL's.     Action/Plan:   No CM needs noted.   Anticipated DC Date:  12/26/2012   Anticipated DC Plan:  HOME/SELF CARE      DC Planning Services  CM consult      Choice offered to / List presented to:             Status of service:  Completed, signed off Medicare Important Message given?  NA - LOS <3 / Initial given by admissions (If response is "NO", the following Medicare IM given date fields will be blank) Date Medicare IM given:   Date Additional Medicare IM given:    Discharge Disposition:  HOME/SELF CARE  Per UR Regulation:    If discussed at Long Length of Stay Meetings, dates discussed:    Comments:  12/27/12 0737 Arlyss Queen, RN BSN CM Pt discharged home today. No CM needs noted.  12/26/12 1405 Arlyss Queen, RN BSN CM

## 2012-12-26 NOTE — Progress Notes (Signed)
CT unremarkable. Discussed with patient. EGD today for further evaluation of pp N/V, upper abdominal pain, weight loss.  I have discussed the risks, alternatives, benefits with regards to but not limited to the risk of reaction to medication, bleeding, infection, perforation and the patient is agreeable to proceed. Written consent to be obtained.

## 2012-12-26 NOTE — Op Note (Signed)
[facility] [address] [place], [address]   ENDOSCOPY PROCEDURE REPORT  PATIENT: Ashley Patrick, Ashley Patrick  MR#: [id number] BIRTHDATE: 1955-06-28 , 57  yrs. old GENDER: Female ENDOSCOPIST: R  Roetta Sessions, MD FACP [facility] REFERRED BY:  Christin Bach, M.D. PROCEDURE DATE:  12/26/2012 PROCEDURE:     EGD with gastric biopsy  INDICATIONS:     Migratory abdominal pain. Laboratory evaluation and serial CTs have failed to elucidate a cause of her symptoms.   Patient notes gradually worsening constipation as well, having to strain frequently.  INFORMED CONSENT:   The risks, benefits, limitations, alternatives and imponderables have been discussed.  The potential for biopsy, esophogeal dilation, etc. have also been reviewed.  Questions have been answered.  All parties agreeable.  Please see the history and physical in the medical record for more information.  MEDICATIONS:  Versed 4 mg IV Demerol 100 mg IV.  Zofran 4 mg IV. Cetacaine spray.  DESCRIPTION OF PROCEDURE:   The EG-[id number] ([id number])  endoscope was introduced through the mouth and advanced to the second portion of the duodenum without difficulty or limitations.  The mucosal surfaces were surveyed very carefully during advancement of the scope and upon withdrawal.  Retroflexion view of the proximal stomach and esophagogastric junction was performed.      FINDINGS:  Circumferential distal esophageal erosions within 5 mm at the GE junction. No evidence of Barrett's esophagus. Stomach empty. Small hiatal hernia. Scattered antral erosions. No ulcer or infiltrating process. Patent pylorus. Examination of the bulb and second portion revealed marked mucosal edema and friability with superficial overlying erosions of the posterior bulbar and proximal second portion duodenal mucosa.  I was not able to identify a frank ulcer, however.  THERAPEUTIC / DIAGNOSTIC MANEUVERS PERFORMED:  Biopsies of the abnormal gastric antrum  taken for histologic study.   COMPLICATIONS:  None  IMPRESSION:  Erosive reflux esophagitis. Gastric and duodenal bulbar erosions-status post gastric biopsy. Today's findings are certainly abnormal and could cause some symptoms although her presentation would be somewhat atypical.  RECOMMENDATIONS:  Acid suppression therapy in the way of Protonix 40 mg orally twice daily for now.  MiraLax 17 g orally at bedtime as needed for constipation.  Hold up for further evaluation of gallbladder, etc.  Advance diet. We'll arrange for early interval followup in our office.  Will arrange her first ever screening colonoscopy as an outpatient in the coming weeks.    _______________________________ R. Roetta Sessions, MD FACP [facility] eSigned:  R. Roetta Sessions, MD FACP [facility] 12/26/2012 6:03 PM     CC:  PATIENT NAME:  Ashley Patrick, Ashley Patrick MR#: [id number]

## 2012-12-26 NOTE — H&P (Signed)
Ashley Patrick is an 57 y.o. female. Admitted for assessment of abdominal pain. She was seen in the office December 25 2012, and exam was impressive for the dramatic complaints of mid abdominal and epigastric pain was associated with active to hyperactive bowel sounds, tremulousness, and tachycardia to 130s, in a patient being seen the first time in referral from the emergency room where she had been seen approximately 2 weeks ago for similar complaints, and evaluation the ultrasound showed no evidence of GYN abnormalities, evidence of a previous hysterectomy and bilateral normal-sized ovaries with a small cyst, and no evidence of adnexal torsion. CT was similarly negative Patient gives a history of having weighed 240 pounds less than a year ago, but has a documented weight of 205 earlier this year, current weight On admission 192 pounds, which patient attributes to intolerance of food and abdominal discomfort  Medical and surgical history significant for bipolar disorder and subsequent disability status. She's currently on no medications for this condition, stating "they had been too medicated". Currently she seems calm, appropriate and her responses to questions seem reasonable and oriented x3. She received oral analgesics her last emergency room visit, ran out several days ago, and then functioning without pain medicines, but reports that the dramatic presentation she has today is the way she's been functioning for the last several days. She is admitted for acute abdominal series and GI consultation. Acute abdominal series has shown no evidence of obstruction. CT scan remains unremarkable, unchanged. There is a small umbilical hernia containing fat only. There is no mass adenopathy or inflammatory process identified. Upon admission and administration analgesics she appears dramatically improved .   Pertinent Gynecological History: Menses: Status post hysterectomy Bleeding: None Contraception: status post  hysterectomy DES exposure: unknown Blood transfusions: none Sexually transmitted diseases: no past history Previous GYN Procedures: Abdominal hysterectomy  Last mammogram:  Date:  Last pap:  Date:  OB History: G, P   Menstrual History: Menarche age:  No LMP recorded. Patient has had a hysterectomy.    Past Medical History  Diagnosis Date  . Back pain   . Obesity   . Nicotine addiction   . Psychosis   . Hypertension   . OD (overdose of drug)     hospitalized for 11 days   . Bipolar 1 disorder   . Hypercholesteremia   . Migraine headache   . Lateral epicondylitis  of elbow     right  . Sleep apnea     STOP BANG score: 4    Past Surgical History  Procedure Laterality Date  . Partial hysterectomy    . Breast biopsy  10/18/2011    Procedure: BREAST BIOPSY;  Surgeon: Dalia Heading, MD;  Location: [facility];  Service: General;  Laterality: Left;    Family History  Problem Relation Age of Onset  . Anesthesia problems Neg Hx   . Hypotension Neg Hx   . Malignant hyperthermia Neg Hx   . Pseudochol deficiency Neg Hx     Social History:  reports that she has been smoking Cigarettes.  She has a 19 pack-year smoking history. She uses smokeless tobacco. She reports that she does not drink alcohol or use illicit drugs.  Allergies: No Known Allergies  Prescriptions prior to admission  Medication Sig Dispense Refill  . amLODipine-benazepril (LOTREL) 5-10 MG per capsule Take 1 capsule by mouth daily.      . hydrochlorothiazide (MICROZIDE) 12.5 MG capsule Take 12.5 mg by mouth every morning.  ROS  Blood pressure 122/70, pulse 87, temperature 98.4 F (36.9 C), temperature source Oral, resp. rate 20, height 5\' 6"  (1.676 m), weight 192 lb 15.9 oz (87.54 kg), SpO2 98.00%. Physical Exam Physical Examination: General appearance - oriented to person, place, and time, overweight, anxious, in mild to moderate distress and ill-appearing Mental status - alert, oriented to person,  place, and time, normal mood, behavior, speech, dress, motor activity, and thought processes Eyes - pupils equal and reactive, extraocular eye movements intact Chest - clear to auscultation, no wheezes, rales or rhonchi, symmetric air entry Abdomen - tenderness noted in epigastrium, without masses or rebound bowel sounds hyperactive Pelvic - VULVA: normal appearing vulva with no masses, tenderness or lesions, VAGINA: normal appearing vagina with normal color and discharge, no lesions, vaginal sidewall nontender which usually has a reassuring indication of appropriate responses to questioning regarding pelvic pain , CERVIX: normal appearing cervix without discharge or lesions, surgically absent, UTERUS: surgically absent, vaginal cuff well healed. The patient finds palpation of the vaginal cuff tender Extremities - peripheral pulses normal, no pedal edema, no clubbing or cyanosis   Results for orders placed during the hospital encounter of 12/25/12 (from the past 24 hour(s))  CBC WITH DIFFERENTIAL     Status: None   Collection Time    12/25/12  4:15 PM      Result Value Range   WBC 5.9  4.0 - 10.5 K/uL   RBC 4.29  3.87 - 5.11 MIL/uL   Hemoglobin 13.2  12.0 - 15.0 g/dL   HCT 16.1  09.6 - 04.5 %   MCV 86.2  78.0 - 100.0 fL   MCH 30.8  26.0 - 34.0 pg   MCHC 35.7  30.0 - 36.0 g/dL   RDW 40.9  81.1 - 91.4 %   Platelets 238  150 - 400 K/uL   Neutrophils Relative % 51  43 - 77 %   Neutro Abs 3.0  1.7 - 7.7 K/uL   Lymphocytes Relative 43  12 - 46 %   Lymphs Abs 2.6  0.7 - 4.0 K/uL   Monocytes Relative 4  3 - 12 %   Monocytes Absolute 0.3  0.1 - 1.0 K/uL   Eosinophils Relative 1  0 - 5 %   Eosinophils Absolute 0.1  0.0 - 0.7 K/uL   Basophils Relative 0  0 - 1 %   Basophils Absolute 0.0  0.0 - 0.1 K/uL  COMPREHENSIVE METABOLIC PANEL     Status: Abnormal   Collection Time    12/25/12  4:15 PM      Result Value Range   Sodium 144  135 - 145 mEq/L   Potassium 2.8 (*) 3.5 - 5.1 mEq/L    Chloride 105  96 - 112 mEq/L   CO2 27  19 - 32 mEq/L   Glucose, Bld 120 (*) 70 - 99 mg/dL   BUN 9  6 - 23 mg/dL   Creatinine, Ser 7.82  0.50 - 1.10 mg/dL   Calcium 95.6  8.4 - 21.3 mg/dL   Total Protein 8.0  6.0 - 8.3 g/dL   Albumin 4.4  3.5 - 5.2 g/dL   AST 16  0 - 37 U/L   ALT 16  0 - 35 U/L   Alkaline Phosphatase 108  39 - 117 U/L   Total Bilirubin 0.4  0.3 - 1.2 mg/dL   GFR calc non Af Amer >90  >90 mL/min   GFR calc Af Amer >90  >90 mL/min  SEDIMENTATION RATE     Status: Abnormal   Collection Time    12/25/12  4:15 PM      Result Value Range   Sed Rate 25 (*) 0 - 22 mm/hr  LIPASE, BLOOD     Status: None   Collection Time    12/25/12  4:15 PM      Result Value Range   Lipase 39  11 - 59 U/L  URINALYSIS, ROUTINE W REFLEX MICROSCOPIC     Status: Abnormal   Collection Time    12/25/12  8:00 PM      Result Value Range   Color, Urine YELLOW  YELLOW   APPearance CLEAR  CLEAR   Specific Gravity, Urine 1.010  1.005 - 1.030   pH 8.5 (*) 5.0 - 8.0   Glucose, UA NEGATIVE  NEGATIVE mg/dL   Hgb urine dipstick MODERATE (*) NEGATIVE   Bilirubin Urine NEGATIVE  NEGATIVE   Ketones, ur NEGATIVE  NEGATIVE mg/dL   Protein, ur NEGATIVE  NEGATIVE mg/dL   Urobilinogen, UA 0.2  0.0 - 1.0 mg/dL   Nitrite NEGATIVE  NEGATIVE   Leukocytes, UA NEGATIVE  NEGATIVE  MRSA PCR SCREENING     Status: None   Collection Time    12/25/12  8:00 PM      Result Value Range   MRSA by PCR NEGATIVE  NEGATIVE  URINE MICROSCOPIC-ADD ON     Status: Abnormal   Collection Time    12/25/12  8:00 PM      Result Value Range   Squamous Epithelial / LPF FEW (*) RARE   WBC, UA 3-6  <3 WBC/hpf   RBC / HPF 3-6  <3 RBC/hpf   Bacteria, UA FEW (*) RARE    Ct Abdomen Pelvis W Contrast  12/25/2012   *RADIOLOGY REPORT*  Clinical Data: Acute all over abdominal pain, recent ovarian cyst, hypertension  CT ABDOMEN AND PELVIS WITH CONTRAST  Technique:  Multidetector CT imaging of the abdomen and pelvis was performed following  the standard protocol during bolus administration of intravenous contrast. Sagittal and coronal MPR images reconstructed from axial data set.  Contrast: 50mL OMNIPAQUE IOHEXOL 300 MG/ML  SOLN, OMNIPAQUE IOHEXOL 300 MG/ML  SOLN  Comparison: 11/28/2012  Findings: Lung bases clear. Fetal lobulation of kidneys. Hepatic and right renal cysts unchanged. Liver, spleen, pancreas, kidneys, and adrenal glands normal. Uterus surgically absent with normal sized ovaries. Normal appendix.  Stomach and bowel loops normal appearance. Small umbilical hernia containing fat. No mass, adenopathy, free fluid or inflammatory process. No acute osseous findings.  IMPRESSION: No acute intra-abdominal or intrapelvic abnormalities. Hepatic and right renal cysts.   Original Report Authenticated By: Ulyses Southward, M.D.   Dg Abd Acute W/chest  12/25/2012   *RADIOLOGY REPORT*  Clinical Data: Acute abdomen pain  ACUTE ABDOMEN SERIES (ABDOMEN 2 VIEW & CHEST 1 VIEW)  Comparison: Chest 09/06/2012  Findings: .  Heart size is normal.  Vascularity is normal.  Lungs are clear without infiltrate or effusion.  Normal bowel gas pattern.  Negative for obstruction or ileus. Negative for free air.  No renal calculi.  IMPRESSION: Negative chest and abdomen.   Original Report Authenticated By: Janeece Riggers, M.D.    Assessment/Plan: Acute and chronic abdominal pain unknown etiology History of abdominal hysterectomy History bipolar disorder, on disability currently stable on no medicines Plan continue evaluation to the appreciated consult with Dr. Benard Rink Given normalcy of findings so far, there may be only a brief hospitalization Taralyn Ferraiolo V 12/26/2012, 7:23  AM

## 2012-12-27 ENCOUNTER — Telehealth: Payer: Self-pay | Admitting: Gastroenterology

## 2012-12-27 DIAGNOSIS — K209 Esophagitis, unspecified: Secondary | ICD-10-CM

## 2012-12-27 LAB — URINE CULTURE: Colony Count: >=100000

## 2012-12-27 MED ORDER — POLYETHYLENE GLYCOL 3350 17 G PO PACK: 17.0000 g | PACK | Freq: Every day | ORAL | Status: DC

## 2012-12-27 MED ORDER — PANTOPRAZOLE SODIUM 40 MG PO TBEC: 40.0000 mg | DELAYED_RELEASE_TABLET | Freq: Two times a day (BID) | ORAL | Status: DC

## 2012-12-27 MED ORDER — HYDROCODONE-ACETAMINOPHEN 5-325 MG PO TABS: 1.0000 | ORAL_TABLET | Freq: Four times a day (QID) | ORAL | Status: DC | PRN

## 2012-12-27 NOTE — Telephone Encounter (Signed)
Needs E30 hospital f/u to set up colonoscopy in 4-6 weeks.

## 2012-12-27 NOTE — Progress Notes (Signed)
Subjective: Patient reports no pain at this time, tolerating diet..    Objective: I have reviewed patient's vital signs, labs and Dr Genia Del notes.  General: alert, cooperative and no distress GI: soft, non-tender; bowel sounds normal; no masses,  no organomegaly   Assessment/Plan: Erosive reflux esophagitis  Discharge home on Protonix 40 bid, eventually taper to qd by Dr Parke Simmers, her primary care,  Miralax 17 gm daily prn constipation.  LOS: 2 days    Ashley Patrick V 12/27/2012, 7:14 AM

## 2012-12-27 NOTE — Discharge Summary (Signed)
Physician Discharge Summary  Patient ID: KEREN ALVERIO MRN: [id number] DOB/AGE: 1955-06-25 57 y.o.  Admit date: 12/25/2012 Discharge date: 12/27/2012  Admission Diagnoses:Acute and chronic abdominal pain unknown etiology  History of abdominal hysterectomy  History bipolar disorder, on disability currently stable on no medicines   Discharge Diagnoses:  Erosive Esophagitis History of abdominal hysterectomy  History bipolar disorder, on disability currently stable on no medicines  Active Problems:   * No active hospital problems. *   Discharged Condition: good  Hospital Course: For details see actual consult notes and EGD notes, the base of the patient was admitted promptly felt better with use of pain medicines, protein pump inhibitors and IV hydration. Patient went to the endoscopy were EGD revealed esophagitis, and patient remains stable at the time of discharge on appropriate medications, Protonix twice daily 40 mg and miralax prn constipation  Consults: GI  Significant Diagnostic Studies: endoscopy: gastroscopy: dr Benard Rink  Treatments: IV hydration and analgesia: Vicodin  Discharge Exam: Blood pressure 129/83, pulse 89, temperature 98.5 F (36.9 C), temperature source Oral, resp. rate 20, height 5\' 6"  (1.676 m), weight 199 lb 11.8 oz (90.6 kg), SpO2 95.00%. General appearance: alert, cooperative and no distress GI: soft, non-tender; bowel sounds normal; no masses,  no organomegaly  Disposition: 01-Home or Self Care  Discharge Orders   Future Orders Complete By Expires     Call MD for:  persistant nausea and vomiting  As directed     Call MD for:  severe uncontrolled pain  As directed     Diet - low sodium heart healthy  As directed     Discharge instructions  As directed     Comments:      Follow up  With Dr Parke Simmers as needed for continued care    Increase activity slowly  As directed         Medication List         amLODipine-benazepril 5-10 MG per capsule   Commonly known as:  LOTREL  Take 1 capsule by mouth daily.     hydrochlorothiazide 12.5 MG capsule  Commonly known as:  MICROZIDE  Take 12.5 mg by mouth every morning.     HYDROcodone-acetaminophen 5-325 MG per tablet  Commonly known as:  NORCO/VICODIN  Take 1 tablet by mouth every 6 (six) hours as needed for pain.     pantoprazole 40 MG tablet  Commonly known as:  PROTONIX  Take 1 tablet (40 mg total) by mouth 2 (two) times daily before a meal.     polyethylene glycol packet  Commonly known as:  MIRALAX / GLYCOLAX  Take 17 g by mouth daily.           Follow-up Information   Follow up with Geraldo Pitter, MD. Schedule an appointment as soon as possible for a visit in 1 month. (or earlier as needed if symptoms worsen)    Contact information:   [address] [place] [address] [phone]       Signed: Tilda Burrow 12/27/2012, 7:24 AM

## 2012-12-30 ENCOUNTER — Encounter (HOSPITAL_COMMUNITY): Payer: Self-pay | Admitting: Internal Medicine

## 2012-12-31 ENCOUNTER — Encounter: Payer: Self-pay | Admitting: Internal Medicine

## 2012-12-31 NOTE — Telephone Encounter (Signed)
Pt is aware of OV on 8/19 at 0800 with [facility] and appt card was mailed

## 2013-01-11 ENCOUNTER — Encounter: Payer: Self-pay | Admitting: Internal Medicine

## 2013-02-03 ENCOUNTER — Encounter: Payer: Self-pay | Admitting: Internal Medicine

## 2013-02-04 ENCOUNTER — Ambulatory Visit: Payer: Medicare Other | Admitting: Gastroenterology

## 2013-02-04 ENCOUNTER — Telehealth: Payer: Self-pay | Admitting: *Deleted

## 2013-02-04 NOTE — Telephone Encounter (Signed)
Please offer to reschedule. Thanks.

## 2013-02-04 NOTE — Telephone Encounter (Signed)
Contacted pt to reschdule appt. Pt stated she moved to [place].

## 2013-04-11 ENCOUNTER — Emergency Department (HOSPITAL_COMMUNITY)
Admission: EM | Admit: 2013-04-11 | Discharge: 2013-04-11 | Disposition: A | Discharge status: Home / Self Care | Payer: Medicare Other | Attending: Emergency Medicine | Admitting: Emergency Medicine

## 2013-04-11 ENCOUNTER — Encounter (HOSPITAL_COMMUNITY): Payer: Self-pay | Admitting: Emergency Medicine

## 2013-04-11 DIAGNOSIS — Z79899 Other long term (current) drug therapy: Secondary | ICD-10-CM | POA: Insufficient documentation

## 2013-04-11 DIAGNOSIS — M545 Low back pain, unspecified: Secondary | ICD-10-CM | POA: Insufficient documentation

## 2013-04-11 DIAGNOSIS — F17200 Nicotine dependence, unspecified, uncomplicated: Secondary | ICD-10-CM | POA: Insufficient documentation

## 2013-04-11 DIAGNOSIS — Z8679 Personal history of other diseases of the circulatory system: Secondary | ICD-10-CM | POA: Insufficient documentation

## 2013-04-11 DIAGNOSIS — R52 Pain, unspecified: Secondary | ICD-10-CM | POA: Insufficient documentation

## 2013-04-11 DIAGNOSIS — E7800 Pure hypercholesterolemia, unspecified: Secondary | ICD-10-CM | POA: Insufficient documentation

## 2013-04-11 DIAGNOSIS — E669 Obesity, unspecified: Secondary | ICD-10-CM | POA: Insufficient documentation

## 2013-04-11 DIAGNOSIS — I10 Essential (primary) hypertension: Secondary | ICD-10-CM | POA: Insufficient documentation

## 2013-04-11 DIAGNOSIS — Z8659 Personal history of other mental and behavioral disorders: Secondary | ICD-10-CM | POA: Insufficient documentation

## 2013-04-11 MED ORDER — OXYCODONE-ACETAMINOPHEN 5-325 MG PO TABS: 1.0000 | ORAL_TABLET | Freq: Once | ORAL | Status: DC

## 2013-04-11 MED ORDER — ONDANSETRON 8 MG PO TBDP
8.0000 mg | ORAL_TABLET | Freq: Once | ORAL | Status: AC
  Administered 2013-04-11: 8 mg via ORAL
  Filled 2013-04-11: qty 1

## 2013-04-11 MED ORDER — HYDROMORPHONE HCL PF 2 MG/ML IJ SOLN
2.0000 mg | Freq: Once | INTRAMUSCULAR | Status: AC
  Administered 2013-04-11: 2 mg via INTRAMUSCULAR
  Filled 2013-04-11: qty 1

## 2013-04-11 MED ORDER — NAPROXEN 500 MG PO TABS: 500.0000 mg | ORAL_TABLET | Freq: Two times a day (BID) | ORAL | Status: DC

## 2013-04-11 MED ORDER — CYCLOBENZAPRINE HCL 10 MG PO TABS
10.0000 mg | ORAL_TABLET | Freq: Once | ORAL | Status: AC
  Administered 2013-04-11: 10 mg via ORAL
  Filled 2013-04-11: qty 1

## 2013-04-11 MED ORDER — OXYCODONE-ACETAMINOPHEN 5-325 MG PO TABS: 1.0000 | ORAL_TABLET | ORAL | Status: DC | PRN

## 2013-04-11 MED ORDER — CYCLOBENZAPRINE HCL 10 MG PO TABS: 10.0000 mg | ORAL_TABLET | Freq: Two times a day (BID) | ORAL | Status: DC | PRN

## 2013-04-11 MED ORDER — NAPROXEN 250 MG PO TABS
500.0000 mg | ORAL_TABLET | Freq: Once | ORAL | Status: AC
  Administered 2013-04-11: 500 mg via ORAL
  Filled 2013-04-11: qty 2

## 2013-04-11 NOTE — ED Provider Notes (Signed)
CSN: [id number]     Arrival date & time 04/11/13  9147 History   First MD Initiated Contact with Patient 04/11/13 [phone]     Chief Complaint  Patient presents with  . Back Pain    low back pain since last night with no known injury. noted blood in stools for a week   (Consider location/radiation/quality/duration/timing/severity/associated sxs/prior Treatment) The history is provided by the patient.   57 year old female has been having low back pain for about the last week which has been getting worse breakout much worse last night. She rates pain at 9/10. It is worse with standing and better she lays still. Pain is in the low lumbar area and radiates to the paralumbar area but not into the hip or leg. She describes the pain as sharp. There is no bowel or bladder dysfunction. Denies weakness, numbness, tingling. She's tried taking Goody powder with no relief. She denies history of trauma or unusual bending or lifting. She denies previous back problems.  Past Medical History  Diagnosis Date  . Back pain   . Obesity   . Nicotine addiction   . Psychosis   . Hypertension   . OD (overdose of drug)     hospitalized for 11 days   . Bipolar 1 disorder   . Hypercholesteremia   . Migraine headache   . Lateral epicondylitis  of elbow     right  . Sleep apnea     STOP BANG score: 4   Past Surgical History  Procedure Laterality Date  . Partial hysterectomy    . Breast biopsy  10/18/2011    Procedure: BREAST BIOPSY;  Surgeon: Dalia Heading, MD;  Location: [facility];  Service: General;  Laterality: Left;  . Esophagogastroduodenoscopy N/A 12/26/2012    AOZ:HYQMVHQ reflux esophagitis. Gastric and duodenal bulbar erosions-status post gastric biopsy   Family History  Problem Relation Age of Onset  . Anesthesia problems Neg Hx   . Hypotension Neg Hx   . Malignant hyperthermia Neg Hx   . Pseudochol deficiency Neg Hx    History  Substance Use Topics  . Smoking status: Current Every Day Smoker --  0.50 packs/day for 38 years    Types: Cigarettes  . Smokeless tobacco: Current User  . Alcohol Use: No   OB History   Grav Para Term Preterm Abortions TAB SAB Ect Mult Living                 Review of Systems  All other systems reviewed and are negative.    Allergies  Review of patient's allergies indicates no known allergies.  Home Medications   Current Outpatient Rx  Name  Route  Sig  Dispense  Refill  . amLODipine-benazepril (LOTREL) 5-10 MG per capsule   Oral   Take 1 capsule by mouth daily.         . hydrochlorothiazide (MICROZIDE) 12.5 MG capsule   Oral   Take 12.5 mg by mouth every morning.         Marland Kitchen HYDROcodone-acetaminophen (NORCO/VICODIN) 5-325 MG per tablet   Oral   Take 1 tablet by mouth every 6 (six) hours as needed for pain.   30 tablet   0   . pantoprazole (PROTONIX) 40 MG tablet   Oral   Take 1 tablet (40 mg total) by mouth 2 (two) times daily before a meal.   60 tablet   6   . polyethylene glycol (MIRALAX / GLYCOLAX) packet   Oral   Take  17 g by mouth daily.   14 each   0    BP 145/82  Pulse 84  Temp(Src) 97.6 F (36.4 C) (Oral)  Resp 20  Ht 5\' 6"  (1.676 m)  Wt 183 lb (83.008 kg)  BMI 29.55 kg/m2  SpO2 100% Physical Exam  Nursing note and vitals reviewed.  57 year old female, resting comfortably and in no acute distress. Vital signs are significant for mild hypertension with blood pressure 145/82. Oxygen saturation is 100%, which is normal. Head is normocephalic and atraumatic. PERRLA, EOMI. Oropharynx is clear. Neck is nontender and supple without adenopathy or JVD. Back is mildly to moderately tender in the lower lumbar area. There is bilateral paralumbar spasm which is worse on the left than on the right. Straight leg raise is positive bilaterally at 45.. Lungs are clear without rales, wheezes, or rhonchi. Chest is nontender. Heart has regular rate and rhythm without murmur. Abdomen is soft, flat, nontender without masses or  hepatosplenomegaly and peristalsis is normoactive. Extremities have no cyanosis or edema, full range of motion is present. Skin is warm and dry without rash. Neurologic: Mental status is normal, cranial nerves are intact, there are no motor or sensory deficits.  ED Course  Procedures (including critical care time)  MDM   1. Acute lumbar back pain    Acute lumbar pain. It is concerning that she has first episode of back pain in her mid 39s. I reviewed her past records and she had a CT scan of her abdomen and pelvis in July at which time there is no evidence of abdominal aortic aneurysm and visualized lumbar spine appeared essentially normal. She will be treated symptomatically today. I do not see indication for additional imaging at this point. She is given hydromorphone intramuscularly, ondansetron sublingually, naproxen and cyclobenzaprine orally.  She feels much better after above noted treatment. She is discharged with prescriptions for naproxen, cyclobenzaprine, and oxycodone acetaminophen.  Dione Booze, MD 04/11/13 [phone]

## 2013-04-14 MED FILL — Oxycodone w/ Acetaminophen Tab 5-325 MG: ORAL | Qty: 6 | Status: AC

## 2013-04-23 ENCOUNTER — Other Ambulatory Visit (HOSPITAL_COMMUNITY): Payer: Self-pay | Admitting: Family Medicine

## 2013-04-23 DIAGNOSIS — Z09 Encounter for follow-up examination after completed treatment for conditions other than malignant neoplasm: Secondary | ICD-10-CM

## 2013-04-23 DIAGNOSIS — N63 Unspecified lump in unspecified breast: Secondary | ICD-10-CM

## 2013-04-24 ENCOUNTER — Encounter: Payer: Self-pay | Admitting: Internal Medicine

## 2013-05-08 ENCOUNTER — Ambulatory Visit: Payer: Medicare Other | Admitting: Gastroenterology

## 2013-05-09 ENCOUNTER — Encounter: Payer: Self-pay | Admitting: Gastroenterology

## 2013-05-09 ENCOUNTER — Other Ambulatory Visit: Payer: Self-pay | Admitting: Gastroenterology

## 2013-05-09 ENCOUNTER — Other Ambulatory Visit: Payer: Self-pay | Admitting: Internal Medicine

## 2013-05-09 ENCOUNTER — Ambulatory Visit (INDEPENDENT_AMBULATORY_CARE_PROVIDER_SITE_OTHER): Payer: Medicare Other | Admitting: Gastroenterology

## 2013-05-09 VITALS — BP 119/83 | HR 108 | Temp 97.0°F | Ht 64.0 in | Wt 182.2 lb

## 2013-05-09 DIAGNOSIS — K2970 Gastritis, unspecified, without bleeding: Secondary | ICD-10-CM | POA: Insufficient documentation

## 2013-05-09 DIAGNOSIS — K625 Hemorrhage of anus and rectum: Secondary | ICD-10-CM

## 2013-05-09 MED ORDER — PEG 3350-KCL-NA BICARB-NACL 420 G PO SOLR: 4000.0000 mL | ORAL | Status: DC

## 2013-05-09 MED ORDER — OMEPRAZOLE 20 MG PO CPDR: 20.0000 mg | DELAYED_RELEASE_CAPSULE | Freq: Every day | ORAL | Status: DC

## 2013-05-09 NOTE — Assessment & Plan Note (Signed)
On EGD in July 2014 while inpatient. Patient's lack of appetite and weight loss is likely secondary to this in the setting of intermittent aspirin powder use. She is not on a PPI, either, and I discussed the importance of taking this daily. CT abd/pelvis in July 2014 negative for occult malignancy.   Start Prilosec once daily.  AVOID all ASA powders and NSAIDs Further work-up indicated if persistent weight loss, lack of appetite. Doubt gastroparesis, as she has no associated N/V, early satiety. Gallbladder remains in situ, but she also denies any postprandial abdominal pain. Likely gastritis is culprit; will evaluate further if no improvement with PPI.

## 2013-05-09 NOTE — Patient Instructions (Signed)
We have scheduled you for a colonoscopy with Dr. Jena Gauss in the near future.  Start taking Prilosec one capsule each morning, 30 minutes before breakfast.    Avoid all Goody powders, BC powders, Ibuprofen, Advil, Motrin, Aleve, or anything like this. These can damage your stomach.

## 2013-05-09 NOTE — Progress Notes (Signed)
CC PCP 

## 2013-05-09 NOTE — Assessment & Plan Note (Signed)
57 year old female with several month history of low-volume hematochezia, occasional fecal seepage, and no prior colonoscopy. She reports a family history of colon cancer in father age 83s. She has vague lower abdominal discomfort that at times is relieved by defecation; Miralax prn is doing well for her.   Likely benign anorectal source with possible underlying constipation playing a role. Increased risk due to FH of colon cancer in first-degree relative. Needs lower GI evaluation for further evaluation. Proceed with TCS with Dr. Jena Gauss in near future: the risks, benefits, and alternatives have been discussed with the patient in detail. The patient states understanding and desires to proceed.    As of note, sister present today, and I have discussed with her pursuing an initial screening colonoscopy as well. She states she will be doing this in the future.

## 2013-05-09 NOTE — Progress Notes (Signed)
  Referring Provider: Bland, Veita, MD Primary Care Physician:  BLAND,VEITA J, MD Primary GI: Dr. Rourk   Chief Complaint  Patient presents with  . Rectal Bleeding    7-8 months    HPI:   [name] is a very pleasant 57-year-old female that returns today due to rectal bleeding. She was seen in consult by our practice in July 2014 secondary to abdominal pain, N/V. EGD at that time revealed erosive esophagitis and non-H.pylori gastritis. This was in the setting of routine Goody powder usage.   Rectal bleeding X 7-8 months. Fecal seepage after BM at times. Notes lower abdominal discomfort, noted as constant. Sometimes improved after a BM. BM usually about every day. Feels productive. No rectal discomfort, itching. No prior colonoscopy. Doesn't care too much about eating. Usually eats just once a day; this is not her baseline, as she states she used to love to eat. Feels like food is just swelling in her mouth and doesn't want to swallow. Highest weight 230s earlier this year, now 182. Takes Miralax prn, which she feels does well for her. Goody powders prn. Has intermittent reflux, nocturnal reflux. NO PPI CURRENTLY.   Father diagnosed with colon cancer in his 80s.   Past Medical History  Diagnosis Date  . Back pain   . Obesity   . Nicotine addiction   . Psychosis   . Hypertension   . OD (overdose of drug)     hospitalized for 11 days   . Bipolar 1 disorder   . Hypercholesteremia   . Migraine headache   . Lateral epicondylitis  of elbow     right  . Sleep apnea     STOP BANG score: 4    Past Surgical History  Procedure Laterality Date  . Partial hysterectomy    . Breast biopsy  10/18/2011    Procedure: BREAST BIOPSY;  Surgeon: Mark A Jenkins, MD;  Location: [facility];  Service: General;  Laterality: Left;  . Esophagogastroduodenoscopy N/A 12/26/2012    RMR:Erosive reflux esophagitis. Gastric and duodenal bulbar erosions-status post gastric biopsynegative H.pylori     Current Outpatient Prescriptions  Medication Sig Dispense Refill  . amLODipine-benazepril (LOTREL) 5-10 MG per capsule Take 1 capsule by mouth daily.      . polyethylene glycol (MIRALAX / GLYCOLAX) packet Take 17 g by mouth daily.  14 each  0  . UNABLE TO FIND as needed. Goody powder      . omeprazole (PRILOSEC) 20 MG capsule Take 1 capsule (20 mg total) by mouth daily.  30 capsule  3  . polyethylene glycol-electrolytes (TRILYTE) 420 G solution Take 4,000 mLs by mouth as directed.  4000 mL  0   No current facility-administered medications for this visit.    Allergies as of 05/09/2013  . (No Known Allergies)    Family History  Problem Relation Age of Onset  . Anesthesia problems Neg Hx   . Hypotension Neg Hx   . Malignant hyperthermia Neg Hx   . Pseudochol deficiency Neg Hx   . Colon cancer Father     diagnosed in his 80s    History   Social History  . Marital Status: Divorced    Spouse Name: N/A    Number of Children: 3  . Years of Education: N/A   Social History Main Topics  . Smoking status: Current Every Day Smoker -- 0.50 packs/day for 38 years    Types: Cigarettes  . Smokeless tobacco: Current User  . Alcohol   Use: No  . Drug Use: No  . Sexual Activity: Yes    Birth Control/ Protection: Surgical   Other Topics Concern  . None   Social History Narrative  . None    Review of Systems: As mentioned in HPI.   Physical Exam: BP 119/83  Pulse 108  Temp(Src) 97 F (36.1 C) (Oral)  Ht 5' 4" (1.626 m)  Wt 182 lb 3.2 oz (82.645 kg)  BMI 31.26 kg/m2 General:   Alert and oriented. No distress noted. Pleasant and cooperative.  Head:  Normocephalic and atraumatic. Eyes:  Conjuctiva clear without scleral icterus. Mouth:  Oral mucosa pink and moist. Good dentition. No lesions. Neck:  Supple, without mass or thyromegaly. Heart:  S1, S2 present without murmurs, rubs, or gallops. Regular rate and rhythm. Abdomen:  +BS, soft, non-tender and non-distended. No  rebound or guarding. No HSM or masses noted. Msk:  Symmetrical without gross deformities. Normal posture. Extremities:  Without edema. Neurologic:  Alert and  oriented x4;  grossly normal neurologically. Skin:  Intact without significant lesions or rashes. Cervical Nodes:  No significant cervical adenopathy. Psych:  Alert and cooperative. Normal mood and affect.  Lab Results  Component Value Date   WBC 5.9 12/25/2012   HGB 13.2 12/25/2012   HCT 37.0 12/25/2012   MCV 86.2 12/25/2012   PLT 238 12/25/2012   Lab Results  Component Value Date   ALT 16 12/25/2012   AST 16 12/25/2012   ALKPHOS 108 12/25/2012   BILITOT 0.4 12/25/2012     

## 2013-05-17 ENCOUNTER — Encounter (HOSPITAL_COMMUNITY): Payer: Self-pay | Admitting: Emergency Medicine

## 2013-05-17 ENCOUNTER — Emergency Department (HOSPITAL_COMMUNITY)
Admission: EM | Admit: 2013-05-17 | Discharge: 2013-05-17 | Disposition: A | Discharge status: Home / Self Care | Payer: Medicare Other | Attending: Emergency Medicine | Admitting: Emergency Medicine

## 2013-05-17 DIAGNOSIS — Z8739 Personal history of other diseases of the musculoskeletal system and connective tissue: Secondary | ICD-10-CM | POA: Insufficient documentation

## 2013-05-17 DIAGNOSIS — Z8659 Personal history of other mental and behavioral disorders: Secondary | ICD-10-CM | POA: Insufficient documentation

## 2013-05-17 DIAGNOSIS — E669 Obesity, unspecified: Secondary | ICD-10-CM | POA: Insufficient documentation

## 2013-05-17 DIAGNOSIS — I10 Essential (primary) hypertension: Secondary | ICD-10-CM | POA: Insufficient documentation

## 2013-05-17 DIAGNOSIS — L0231 Cutaneous abscess of buttock: Secondary | ICD-10-CM | POA: Insufficient documentation

## 2013-05-17 DIAGNOSIS — F17200 Nicotine dependence, unspecified, uncomplicated: Secondary | ICD-10-CM | POA: Insufficient documentation

## 2013-05-17 DIAGNOSIS — Z79899 Other long term (current) drug therapy: Secondary | ICD-10-CM | POA: Insufficient documentation

## 2013-05-17 MED ORDER — LIDOCAINE HCL (PF) 1 % IJ SOLN
INTRAMUSCULAR | Status: AC
  Filled 2013-05-17: qty 5

## 2013-05-17 MED ORDER — SULFAMETHOXAZOLE-TRIMETHOPRIM 800-160 MG PO TABS: 1.0000 | ORAL_TABLET | Freq: Two times a day (BID) | ORAL | Status: AC

## 2013-05-17 MED ORDER — CEPHALEXIN 500 MG PO CAPS: 500.0000 mg | ORAL_CAPSULE | Freq: Four times a day (QID) | ORAL | Status: DC

## 2013-05-17 MED ORDER — HYDROCODONE-ACETAMINOPHEN 5-325 MG PO TABS: 2.0000 | ORAL_TABLET | ORAL | Status: DC | PRN

## 2013-05-17 NOTE — ED Provider Notes (Signed)
CSN: [id number]     Arrival date & time 05/17/13  2130 History  This chart was scribed for Geoffery Lyons, MD,  by Ashley Jacobs, ED Scribe. The patient was seen in room [facility]19[facility] and the patient's care was started at 8:08 AM.   First MD Initiated Contact with Patient 05/17/13 0758     Chief Complaint  Patient presents with  . Wound Check   (Consider location/radiation/quality/duration/timing/severity/associated sxs/prior Treatment) The history is provided by the patient and medical records. No language interpreter was used.   HPI Comments: Ashley Patrick is a 57 y.o. female who presents to the Emergency Department complaining of an abscess to her right buttock that initially occured one week ago. The area presents with erythema and edema. Pt has tried hot water baths to no relief. She had a prior abscess four years ago.  She has a hx of obesity, nicotine addiction, and  Hypercholesteremia. Pt smokes 0.5 packs of cigarettes a day (for the past 38 years).  Past Medical History  Diagnosis Date  . Back pain   . Obesity   . Nicotine addiction   . Psychosis   . Hypertension   . OD (overdose of drug)     hospitalized for 11 days   . Bipolar 1 disorder   . Hypercholesteremia   . Migraine headache   . Lateral epicondylitis  of elbow     right  . Sleep apnea     STOP BANG score: 4   Past Surgical History  Procedure Laterality Date  . Partial hysterectomy    . Breast biopsy  10/18/2011    Procedure: BREAST BIOPSY;  Surgeon: Dalia Heading, MD;  Location: [facility];  Service: General;  Laterality: Left;  . Esophagogastroduodenoscopy N/A 12/26/2012    QMV:HQIONGE reflux esophagitis. Gastric and duodenal bulbar erosions-status post gastric biopsynegative H.pylori   Family History  Problem Relation Age of Onset  . Anesthesia problems Neg Hx   . Hypotension Neg Hx   . Malignant hyperthermia Neg Hx   . Pseudochol deficiency Neg Hx   . Colon cancer Father     diagnosed in his 69s    History  Substance Use Topics  . Smoking status: Current Every Day Smoker -- 0.50 packs/day for 38 years    Types: Cigarettes  . Smokeless tobacco: Current User  . Alcohol Use: No   OB History   Grav Para Term Preterm Abortions TAB SAB Ect Mult Living                 Review of Systems  Constitutional: Negative for fever.  Skin: Positive for wound.  All other systems reviewed and are negative.    Allergies  Review of patient's allergies indicates no known allergies.  Home Medications   Current Outpatient Rx  Name  Route  Sig  Dispense  Refill  . amLODipine-benazepril (LOTREL) 5-10 MG per capsule   Oral   Take 1 capsule by mouth daily.         Marland Kitchen omeprazole (PRILOSEC) 20 MG capsule   Oral   Take 1 capsule (20 mg total) by mouth daily.   30 capsule   3   . polyethylene glycol (MIRALAX / GLYCOLAX) packet   Oral   Take 17 g by mouth daily.   14 each   0   . polyethylene glycol-electrolytes (TRILYTE) 420 G solution   Oral   Take 4,000 mLs by mouth as directed.   4000 mL   0   .  UNABLE TO FIND      as needed. Goody powder          BP 133/91  Pulse 80  Temp(Src) 98.1 F (36.7 C) (Oral)  Resp 20  Ht 5\' 2"  (1.575 m)  Wt 182 lb (82.555 kg)  BMI 33.28 kg/m2  SpO2 100% Physical Exam  Nursing note and vitals reviewed. Constitutional: She is oriented to person, place, and time. She appears well-developed and well-nourished. No distress.  HENT:  Head: Normocephalic and atraumatic.  Eyes: EOM are normal. Pupils are equal, round, and reactive to light.  Neck: Normal range of motion. Neck supple. No tracheal deviation present.  Cardiovascular: Normal rate.   Pulmonary/Chest: Effort normal. No respiratory distress.  Abdominal: Soft. She exhibits no distension.  Musculoskeletal: Normal range of motion.  Neurological: She is alert and oriented to person, place, and time.  Skin: Skin is warm and dry. There is erythema.  Right buttock is noted to have a 4 cm  round fluctuant swollen area. There is mild overlying erythema.   Psychiatric: She has a normal mood and affect. Her behavior is normal.    ED Course  Procedures (including critical care time) DIAGNOSTIC STUDIES: Oxygen Saturation is 100% on room air, normal by my interpretation.    COORDINATION OF CARE: 8:12 AM Discussed course of care with pt . Pt understands and agrees.  Labs Review Labs Reviewed - No data to display Imaging Review No results found.  INCISION AND DRAINAGE Performed by: Geoffery Lyons Consent: Verbal consent obtained. Risks and benefits: risks, benefits and alternatives were discussed Type: abscess  Body area: right buttock  Anesthesia: local infiltration  Incision was made with a scalpel.  Local anesthetic: lidocaine 1% without epinephrine  Anesthetic total: 3 ml  Complexity: complex Blunt dissection to break up loculations  Drainage: purulent  Drainage amount: moderate  Packing material: 1/4 in iodoform gauze  Patient tolerance: Patient tolerated the procedure well with no immediate complications.     MDM  No diagnosis found. Patient is a 57 year old female presents with an abscess on her right buttock. This was treated with incision and drainage. She tolerated the procedure well. She will be discharged with antibiotics and pain medication and advised to soak it multiple times daily. She is to return in 2 days for a wound check.  I personally performed the services described in this documentation, which was scribed in my presence. The recorded information has been reviewed and is accurate.      Geoffery Lyons, MD 05/17/13 [id number]

## 2013-05-17 NOTE — ED Notes (Signed)
Pt reports ?abcess to right butt cheek. Area is red and swollen. Not draining, no fever.

## 2013-05-23 ENCOUNTER — Encounter (HOSPITAL_COMMUNITY): Payer: Self-pay | Admitting: Pharmacy Technician

## 2013-05-29 ENCOUNTER — Encounter (HOSPITAL_COMMUNITY)
Admission: RE | Disposition: A | Discharge status: Home / Self Care | Payer: Self-pay | Source: Ambulatory Visit | Attending: Internal Medicine

## 2013-05-29 ENCOUNTER — Ambulatory Visit (HOSPITAL_COMMUNITY)
Admission: RE | Admit: 2013-05-29 | Discharge: 2013-05-29 | Disposition: A | Discharge status: Home / Self Care | Payer: Medicare Other | Source: Ambulatory Visit | Attending: Internal Medicine | Admitting: Internal Medicine

## 2013-05-29 ENCOUNTER — Encounter (HOSPITAL_COMMUNITY): Payer: Self-pay | Admitting: *Deleted

## 2013-05-29 DIAGNOSIS — I10 Essential (primary) hypertension: Secondary | ICD-10-CM | POA: Insufficient documentation

## 2013-05-29 DIAGNOSIS — C187 Malignant neoplasm of sigmoid colon: Secondary | ICD-10-CM | POA: Insufficient documentation

## 2013-05-29 DIAGNOSIS — K2970 Gastritis, unspecified, without bleeding: Secondary | ICD-10-CM

## 2013-05-29 DIAGNOSIS — D128 Benign neoplasm of rectum: Secondary | ICD-10-CM

## 2013-05-29 DIAGNOSIS — D126 Benign neoplasm of colon, unspecified: Secondary | ICD-10-CM | POA: Insufficient documentation

## 2013-05-29 DIAGNOSIS — K921 Melena: Secondary | ICD-10-CM

## 2013-05-29 DIAGNOSIS — K625 Hemorrhage of anus and rectum: Secondary | ICD-10-CM

## 2013-05-29 DIAGNOSIS — D129 Benign neoplasm of anus and anal canal: Secondary | ICD-10-CM

## 2013-05-29 DIAGNOSIS — K5730 Diverticulosis of large intestine without perforation or abscess without bleeding: Secondary | ICD-10-CM

## 2013-05-29 DIAGNOSIS — K639 Disease of intestine, unspecified: Secondary | ICD-10-CM

## 2013-05-29 HISTORY — PX: COLONOSCOPY: SHX5424

## 2013-05-29 SURGERY — COLONOSCOPY
Anesthesia: Moderate Sedation

## 2013-05-29 MED ORDER — STERILE WATER FOR IRRIGATION IR SOLN
Status: DC | PRN
  Administered 2013-05-29: 12:00:00

## 2013-05-29 MED ORDER — MEPERIDINE HCL 100 MG/ML IJ SOLN
INTRAMUSCULAR | Status: AC
  Filled 2013-05-29: qty 2

## 2013-05-29 MED ORDER — ONDANSETRON HCL 4 MG/2ML IJ SOLN
INTRAMUSCULAR | Status: DC | PRN
  Administered 2013-05-29: 4 mg via INTRAVENOUS

## 2013-05-29 MED ORDER — SODIUM CHLORIDE 0.9 % IV SOLN
INTRAVENOUS | Status: DC
  Administered 2013-05-29: 11:00:00 via INTRAVENOUS

## 2013-05-29 MED ORDER — MEPERIDINE HCL 100 MG/ML IJ SOLN
INTRAMUSCULAR | Status: DC | PRN
  Administered 2013-05-29 (×2): 50 mg via INTRAVENOUS

## 2013-05-29 MED ORDER — MIDAZOLAM HCL 5 MG/5ML IJ SOLN
INTRAMUSCULAR | Status: AC
  Filled 2013-05-29: qty 10

## 2013-05-29 MED ORDER — MIDAZOLAM HCL 5 MG/5ML IJ SOLN
INTRAMUSCULAR | Status: DC | PRN
  Administered 2013-05-29: 2 mg via INTRAVENOUS
  Administered 2013-05-29 (×4): 1 mg via INTRAVENOUS
  Administered 2013-05-29: 2 mg via INTRAVENOUS

## 2013-05-29 MED ORDER — ONDANSETRON HCL 4 MG/2ML IJ SOLN
INTRAMUSCULAR | Status: AC
  Filled 2013-05-29: qty 2

## 2013-05-29 NOTE — Op Note (Signed)
[facility] [address] [place], [address]   COLONOSCOPY PROCEDURE REPORT  PATIENT: Ashley Patrick, Ashley Patrick  MR#:         [id number] BIRTHDATE: [birth year] , 57  yrs. old GENDER: Female ENDOSCOPIST: R.  Roetta Sessions, MD FACP FACG REFERRED BY:  Renaye Rakers, M.D. PROCEDURE DATE:  05/29/2013 PROCEDURE:     Colonoscopy with snare polypectomy, biopsy and lesion tattooing  INDICATIONS: hematochezia; first ever colonoscopy  INFORMED CONSENT:  The risks, benefits, alternatives and imponderables including but not limited to bleeding, perforation as well as the possibility of a missed lesion have been reviewed.  The potential for biopsy, lesion removal, etc. have also been discussed.  Questions have been answered.  All parties agreeable. Please see the history and physical in the medical record for more information.  MEDICATIONS: Versed 8 mg IV and Demerol 100 mg IV in divided doses. Zofran 4 mg IV.  DESCRIPTION OF PROCEDURE:  After a digital rectal exam was performed, the EC-3890Li ([id number])  colonoscope was advanced from the anus through the rectum and colon to the area of the cecum, ileocecal valve and appendiceal orifice.  The cecum was deeply intubated.  These structures were well-seen and photographed for the record.  From the level of the cecum and ileocecal valve, the scope was slowly and cautiously withdrawn.  The mucosal surfaces were carefully surveyed utilizing scope tip deflection to facilitate fold flattening as needed.  The scope was pulled down into the rectum where a thorough examination including retroflexion was performed.    FINDINGS:  Adequate preparation. She had a couple of distal diminutive and rectosigmoid polyps; otherwise the rectum appeared entirely normal. Patient was found to have a 3x4 cm annular-appearing tumor in the sigmoid segment beginning approximately 30 cm. Please see above.  In the descending segment the patient had a single  diminutive polyp and a 1 cm pedunculated polyp.  In the ascending segment patient a 1 cm pedunculated polyp. In the cecum, he patient had a 4 mm polyp. Patient also sigmoid diverticulosis; the remainder of colonic mucosa appeared normal.  THERAPEUTIC / DIAGNOSTIC MANEUVERS PERFORMED:  The rectal/rectosigmoid diminutive polyps were ablated with the tip of the hot snare loop. The larger colonic polyps removed with hot or cold snare polypectomy. The single diminutive descending colon polyp was removed cold biopsy technique. The sigmoid mass was biopsied multiple times with the jumbo biopsy forceps. Finally, the sigmoid mass was tattooed approximately 2 cm above the proximal extent of the lesion and 2-3 cm below the distal extent of the lesion.  COMPLICATIONS: none  CECAL WITHDRAWAL TIME:  33 minutes  IMPRESSION:  Sigmoid mass most likely representing colorectal cancer -  S/ P biospy.Multiple colonic and rectal polyps removed/treated as described above. Colonic diverticulosis.  RECOMMENDATIONS: Followup pathology. Surgical consultation needed. Will discuss with Dr. Parke Simmers. I have discussed my findings and recommendations with family member at bedside.   _______________________________ eSigned:  R. Roetta Sessions, MD FACP [facility] 05/29/2013 12:44 PM   CC:    PATIENT NAME:  Ashley Patrick, Ashley Patrick MR#: [id number]

## 2013-05-29 NOTE — Interval H&P Note (Signed)
History and Physical Interval Note:  05/29/2013 11:39 AM  Ashley Patrick  has presented today for surgery, with the diagnosis of RECTAL BLEEDING AND GASTRITIS  The various methods of treatment have been discussed with the patient and family. After consideration of risks, benefits and other options for treatment, the patient has consented to  Procedure(s) with comments: COLONOSCOPY (N/A) - 11:30 as a surgical intervention .  The patient's history has been reviewed, patient examined, no change in status, stable for surgery.  I have reviewed the patient's chart and labs.  Questions were answered to the patient's satisfaction.   No change. Has not yet started Prilosec. Colonoscopy today per plan.The risks, benefits, limitations, alternatives and imponderables have been reviewed with the patient. Questions have been answered. All parties are agreeable.   Eula Listen

## 2013-05-29 NOTE — Progress Notes (Signed)
Discussed my colonoscopy findings with Dr. Parke Simmers via telephone today. She wishes the patient to be referred for surgery to someone locally. I will make arrangements for her see Dr. Franky Macho ASAP

## 2013-05-29 NOTE — H&P (View-Only) (Signed)
Referring Provider: Renaye Rakers, MD Primary Care Physician:  Geraldo Pitter, MD Primary GI: Dr. Jena Gauss   Chief Complaint  Patient presents with  . Rectal Bleeding    7-8 months    HPI:   [name] is a very pleasant 57 year old female that returns today due to rectal bleeding. She was seen in consult by our practice in July 2014 secondary to abdominal pain, N/V. EGD at that time revealed erosive esophagitis and non-H.pylori gastritis. This was in the setting of routine Goody powder usage.   Rectal bleeding X 7-8 months. Fecal seepage after BM at times. Notes lower abdominal discomfort, noted as constant. Sometimes improved after a BM. BM usually about every day. Feels productive. No rectal discomfort, itching. No prior colonoscopy. Doesn't care too much about eating. Usually eats just once a day; this is not her baseline, as she states she used to love to eat. Feels like food is just swelling in her mouth and doesn't want to swallow. Highest weight 230s earlier this year, now 182. Takes Miralax prn, which she feels does well for her. Goody powders prn. Has intermittent reflux, nocturnal reflux. NO PPI CURRENTLY.   Father diagnosed with colon cancer in his 65s.   Past Medical History  Diagnosis Date  . Back pain   . Obesity   . Nicotine addiction   . Psychosis   . Hypertension   . OD (overdose of drug)     hospitalized for 11 days   . Bipolar 1 disorder   . Hypercholesteremia   . Migraine headache   . Lateral epicondylitis  of elbow     right  . Sleep apnea     STOP BANG score: 4    Past Surgical History  Procedure Laterality Date  . Partial hysterectomy    . Breast biopsy  10/18/2011    Procedure: BREAST BIOPSY;  Surgeon: Dalia Heading, MD;  Location: [facility];  Service: General;  Laterality: Left;  . Esophagogastroduodenoscopy N/A 12/26/2012    NWG:NFAOZHY reflux esophagitis. Gastric and duodenal bulbar erosions-status post gastric biopsynegative H.pylori     Current Outpatient Prescriptions  Medication Sig Dispense Refill  . amLODipine-benazepril (LOTREL) 5-10 MG per capsule Take 1 capsule by mouth daily.      . polyethylene glycol (MIRALAX / GLYCOLAX) packet Take 17 g by mouth daily.  14 each  0  . UNABLE TO FIND as needed. Goody powder      . omeprazole (PRILOSEC) 20 MG capsule Take 1 capsule (20 mg total) by mouth daily.  30 capsule  3  . polyethylene glycol-electrolytes (TRILYTE) 420 G solution Take 4,000 mLs by mouth as directed.  4000 mL  0   No current facility-administered medications for this visit.    Allergies as of 05/09/2013  . (No Known Allergies)    Family History  Problem Relation Age of Onset  . Anesthesia problems Neg Hx   . Hypotension Neg Hx   . Malignant hyperthermia Neg Hx   . Pseudochol deficiency Neg Hx   . Colon cancer Father     diagnosed in his 38s    History   Social History  . Marital Status: Divorced    Spouse Name: N/A    Number of Children: 3  . Years of Education: N/A   Social History Main Topics  . Smoking status: Current Every Day Smoker -- 0.50 packs/day for 38 years    Types: Cigarettes  . Smokeless tobacco: Current User  . Alcohol  Use: No  . Drug Use: No  . Sexual Activity: Yes    Birth Control/ Protection: Surgical   Other Topics Concern  . None   Social History Narrative  . None    Review of Systems: As mentioned in HPI.   Physical Exam: BP 119/83  Pulse 108  Temp(Src) 97 F (36.1 C) (Oral)  Ht 5\' 4"  (1.626 m)  Wt 182 lb 3.2 oz (82.645 kg)  BMI 31.26 kg/m2 General:   Alert and oriented. No distress noted. Pleasant and cooperative.  Head:  Normocephalic and atraumatic. Eyes:  Conjuctiva clear without scleral icterus. Mouth:  Oral mucosa pink and moist. Good dentition. No lesions. Neck:  Supple, without mass or thyromegaly. Heart:  S1, S2 present without murmurs, rubs, or gallops. Regular rate and rhythm. Abdomen:  +BS, soft, non-tender and non-distended. No  rebound or guarding. No HSM or masses noted. Msk:  Symmetrical without gross deformities. Normal posture. Extremities:  Without edema. Neurologic:  Alert and  oriented x4;  grossly normal neurologically. Skin:  Intact without significant lesions or rashes. Cervical Nodes:  No significant cervical adenopathy. Psych:  Alert and cooperative. Normal mood and affect.  Lab Results  Component Value Date   WBC 5.9 12/25/2012   HGB 13.2 12/25/2012   HCT 37.0 12/25/2012   MCV 86.2 12/25/2012   PLT 238 12/25/2012   Lab Results  Component Value Date   ALT 16 12/25/2012   AST 16 12/25/2012   ALKPHOS 108 12/25/2012   BILITOT 0.4 12/25/2012

## 2013-05-30 ENCOUNTER — Emergency Department (HOSPITAL_COMMUNITY)
Admission: EM | Admit: 2013-05-30 | Discharge: 2013-05-30 | Disposition: A | Discharge status: Home / Self Care | Payer: Medicare Other | Attending: Emergency Medicine | Admitting: Emergency Medicine

## 2013-05-30 ENCOUNTER — Telehealth: Payer: Self-pay | Admitting: Internal Medicine

## 2013-05-30 ENCOUNTER — Encounter (HOSPITAL_COMMUNITY): Payer: Self-pay | Admitting: Emergency Medicine

## 2013-05-30 ENCOUNTER — Other Ambulatory Visit: Payer: Self-pay | Admitting: Internal Medicine

## 2013-05-30 ENCOUNTER — Emergency Department (HOSPITAL_COMMUNITY): Payer: Medicare Other

## 2013-05-30 DIAGNOSIS — E669 Obesity, unspecified: Secondary | ICD-10-CM | POA: Insufficient documentation

## 2013-05-30 DIAGNOSIS — Z8739 Personal history of other diseases of the musculoskeletal system and connective tissue: Secondary | ICD-10-CM | POA: Insufficient documentation

## 2013-05-30 DIAGNOSIS — F17200 Nicotine dependence, unspecified, uncomplicated: Secondary | ICD-10-CM | POA: Insufficient documentation

## 2013-05-30 DIAGNOSIS — Z90710 Acquired absence of both cervix and uterus: Secondary | ICD-10-CM | POA: Insufficient documentation

## 2013-05-30 DIAGNOSIS — Z9889 Other specified postprocedural states: Secondary | ICD-10-CM | POA: Insufficient documentation

## 2013-05-30 DIAGNOSIS — Z8659 Personal history of other mental and behavioral disorders: Secondary | ICD-10-CM | POA: Insufficient documentation

## 2013-05-30 DIAGNOSIS — Z8669 Personal history of other diseases of the nervous system and sense organs: Secondary | ICD-10-CM | POA: Insufficient documentation

## 2013-05-30 DIAGNOSIS — R1084 Generalized abdominal pain: Secondary | ICD-10-CM | POA: Insufficient documentation

## 2013-05-30 DIAGNOSIS — Z860100 Personal history of colon polyps, unspecified: Secondary | ICD-10-CM | POA: Insufficient documentation

## 2013-05-30 DIAGNOSIS — I10 Essential (primary) hypertension: Secondary | ICD-10-CM | POA: Insufficient documentation

## 2013-05-30 DIAGNOSIS — D490 Neoplasm of unspecified behavior of digestive system: Secondary | ICD-10-CM

## 2013-05-30 DIAGNOSIS — Z79899 Other long term (current) drug therapy: Secondary | ICD-10-CM | POA: Insufficient documentation

## 2013-05-30 DIAGNOSIS — Z792 Long term (current) use of antibiotics: Secondary | ICD-10-CM | POA: Insufficient documentation

## 2013-05-30 DIAGNOSIS — R109 Unspecified abdominal pain: Secondary | ICD-10-CM

## 2013-05-30 LAB — CBC WITH DIFFERENTIAL/PLATELET
Basophils Absolute: 0 10*3/uL (ref 0.0–0.1)
Basophils Relative: 0 % (ref 0–1)
Eosinophils Absolute: 0.3 10*3/uL (ref 0.0–0.7)
Hemoglobin: 12.7 g/dL (ref 12.0–15.0)
MCH: 29.6 pg (ref 26.0–34.0)
MCHC: 34.6 g/dL (ref 30.0–36.0)
Monocytes Absolute: 0.3 10*3/uL (ref 0.1–1.0)
Monocytes Relative: 4 % (ref 3–12)
Neutrophils Relative %: 47 % (ref 43–77)
RDW: 14.5 % (ref 11.5–15.5)

## 2013-05-30 LAB — BASIC METABOLIC PANEL
BUN: 15 mg/dL (ref 6–23)
CO2: 23 mEq/L (ref 19–32)
Creatinine, Ser: 0.8 mg/dL (ref 0.50–1.10)
GFR calc Af Amer: >90 mL/min (ref >90)
GFR calc non Af Amer: 81 mL/min — ABNORMAL LOW (ref >90)
Potassium: 3.4 mEq/L — ABNORMAL LOW (ref 3.5–5.1)

## 2013-05-30 MED ORDER — SODIUM CHLORIDE 0.9 % IV BOLUS (SEPSIS)
500.0000 mL | Freq: Once | INTRAVENOUS | Status: AC
  Administered 2013-05-30: 500 mL via INTRAVENOUS

## 2013-05-30 MED ORDER — DICYCLOMINE HCL 20 MG PO TABS: 20.0000 mg | ORAL_TABLET | Freq: Three times a day (TID) | ORAL | Status: DC | PRN

## 2013-05-30 MED ORDER — MORPHINE SULFATE 4 MG/ML IJ SOLN
4.0000 mg | Freq: Once | INTRAMUSCULAR | Status: AC
  Administered 2013-05-30: 4 mg via INTRAVENOUS
  Filled 2013-05-30: qty 1

## 2013-05-30 MED ORDER — OXYCODONE-ACETAMINOPHEN 5-325 MG PO TABS: 1.0000 | ORAL_TABLET | Freq: Four times a day (QID) | ORAL | Status: DC | PRN

## 2013-05-30 MED ORDER — IOHEXOL 300 MG/ML  SOLN
50.0000 mL | Freq: Once | INTRAMUSCULAR | Status: AC | PRN
  Administered 2013-05-30: 50 mL via ORAL

## 2013-05-30 MED ORDER — IOHEXOL 300 MG/ML  SOLN
100.0000 mL | Freq: Once | INTRAMUSCULAR | Status: AC | PRN
  Administered 2013-05-30: 100 mL via INTRAVENOUS

## 2013-05-30 MED ORDER — ONDANSETRON HCL 4 MG/2ML IJ SOLN
4.0000 mg | Freq: Once | INTRAMUSCULAR | Status: AC
  Administered 2013-05-30: 4 mg via INTRAVENOUS
  Filled 2013-05-30: qty 2

## 2013-05-30 NOTE — Telephone Encounter (Signed)
Ashley Patrick is calling crying c/o severe abdominal pain today, was scoped by RMR yesterday, I told [name] that RMR is off today and that I would be sending this to Dr. Darrick Penna for review and I advised her that if her pain is that severe she should consider going to the ER for evaluation.

## 2013-05-30 NOTE — ED Notes (Signed)
Pt states had a Colonoscopy yesterday, had several polyps removed, pt had procedure to investigate repeated episodes of bloody stool. Pt denies blood in stool currently, has lower abdominal pain, worse with cough or straining.

## 2013-05-30 NOTE — ED Provider Notes (Signed)
CSN: [id number]     Arrival date & time 05/30/13  0957 History   First MD Initiated Contact with Patient 05/30/13 1012    This chart was scribed for [employer]. Rubin Payor, MD by Ellin Mayhew, ED Scribe. This patient was seen in room APA16A/APA16A and the patient's care was started at 10:19 AM.  Chief Complaint  Patient presents with  . Abdominal Pain   The history is provided by the patient. No language interpreter was used.   HPI Comments: Ashley Patrick is a 57 y.o. female who presents to the Emergency Department complaining of gradually worsening lower abdominal pain diffusely to both sides since last night. She had a colonoscopy with several polyps and a biopsy of a tumor yesterday. Her pain has been constant since then and she describes it as an aching pain. She does not have any fever, nausea, vomiting, diarrhea, hematochezia, rectal bleeding and does not have any hematuria. She reports having had normal bowel movements since the colonoscopy. She has no reported allergies to medication.  Past Medical History  Diagnosis Date  . Back pain   . Obesity   . Nicotine addiction   . Psychosis   . Hypertension   . OD (overdose of drug)     hospitalized for 11 days   . Bipolar 1 disorder   . Hypercholesteremia   . Migraine headache   . Lateral epicondylitis  of elbow     right  . Sleep apnea     STOP BANG score: 4   Past Surgical History  Procedure Laterality Date  . Partial hysterectomy    . Breast biopsy  10/18/2011    Procedure: BREAST BIOPSY;  Surgeon: Dalia Heading, MD;  Location: [facility];  Service: General;  Laterality: Left;  . Esophagogastroduodenoscopy N/A 12/26/2012    JXB:JYNWGNF reflux esophagitis. Gastric and duodenal bulbar erosions-status post gastric biopsynegative H.pylori  . Abdominal hysterectomy    . Colonoscopy     Family History  Problem Relation Age of Onset  . Anesthesia problems Neg Hx   . Hypotension Neg Hx   . Malignant hyperthermia Neg Hx   .  Pseudochol deficiency Neg Hx   . Colon cancer Father     diagnosed in his 58s   History  Substance Use Topics  . Smoking status: Current Every Day Smoker -- 0.50 packs/day for 38 years    Types: Cigarettes  . Smokeless tobacco: Current User  . Alcohol Use: No   OB History   Grav Para Term Preterm Abortions TAB SAB Ect Mult Living                 Review of Systems  Constitutional: Negative for fever.  Gastrointestinal: Positive for abdominal pain. Negative for nausea, vomiting, diarrhea, blood in stool and anal bleeding.  Genitourinary: Negative for hematuria.  All other systems reviewed and are negative.   Allergies  Review of patient's allergies indicates no known allergies.  Home Medications   Current Outpatient Rx  Name  Route  Sig  Dispense  Refill  . amLODipine-benazepril (LOTREL) 5-10 MG per capsule   Oral   Take 1 capsule by mouth daily.         . cephALEXin (KEFLEX) 500 MG capsule   Oral   Take 1 capsule (500 mg total) by mouth 4 (four) times daily.   28 capsule   0   . hydrochlorothiazide (MICROZIDE) 12.5 MG capsule   Oral   Take 12.5 mg by mouth daily.         Marland Kitchen  HYDROcodone-acetaminophen (NORCO) 5-325 MG per tablet   Oral   Take 2 tablets by mouth every 4 (four) hours as needed.   20 tablet   0   . omeprazole (PRILOSEC) 20 MG capsule   Oral   Take 1 capsule (20 mg total) by mouth daily.   30 capsule   3   . polyethylene glycol (MIRALAX / GLYCOLAX) packet   Oral   Take 17 g by mouth daily.   14 each   0   . polyethylene glycol-electrolytes (TRILYTE) 420 G solution   Oral   Take 4,000 mLs by mouth as directed.   4000 mL   0    Triage Vitals: BP 123/76  Pulse 94  Temp(Src) 97.7 F (36.5 C) (Oral)  Resp 18  Ht 5\' 4"  (1.626 m)  Wt 180 lb (81.647 kg)  BMI 30.88 kg/m2  SpO2 100%  Physical Exam  Nursing note and vitals reviewed. Constitutional: She is oriented to person, place, and time. She appears well-developed and  well-nourished.  Appears uncomfortable.  HENT:  Head: Normocephalic and atraumatic.  Eyes: EOM are normal. Pupils are equal, round, and reactive to light.  Neck: Normal range of motion. Neck supple.  Cardiovascular: Normal rate, regular rhythm and normal heart sounds.   Pulmonary/Chest: Effort normal and breath sounds normal.  Abdominal: There is tenderness. There is rebound.  Moderate tenderness to LUQ, suprapubic, and RLQ regions of the abdomen. Some rebound. No hernias palpated.  Neurological: She is alert and oriented to person, place, and time.  Skin: Skin is warm and dry.  Psychiatric: She has a normal mood and affect. Her behavior is normal.    ED Course  Procedures (including critical care time)  Results for orders placed during the hospital encounter of 12/25/12  MRSA PCR SCREENING      Result Value Range   MRSA by PCR NEGATIVE  NEGATIVE  URINE CULTURE      Result Value Range   Specimen Description URINE, CLEAN CATCH     Special Requests NONE     Culture  Setup Time 12/25/2012 22:35     Colony Count >=100,000 COLONIES/ML     Culture       Value: Multiple bacterial morphotypes present, none predominant. Suggest appropriate recollection if clinically indicated.   Report Status 12/27/2012 FINAL    CBC WITH DIFFERENTIAL      Result Value Range   WBC 5.9  4.0 - 10.5 K/uL   RBC 4.29  3.87 - 5.11 MIL/uL   Hemoglobin 13.2  12.0 - 15.0 g/dL   HCT 16.1  09.6 - 04.5 %   MCV 86.2  78.0 - 100.0 fL   MCH 30.8  26.0 - 34.0 pg   MCHC 35.7  30.0 - 36.0 g/dL   RDW 40.9  81.1 - 91.4 %   Platelets 238  150 - 400 K/uL   Neutrophils Relative % 51  43 - 77 %   Neutro Abs 3.0  1.7 - 7.7 K/uL   Lymphocytes Relative 43  12 - 46 %   Lymphs Abs 2.6  0.7 - 4.0 K/uL   Monocytes Relative 4  3 - 12 %   Monocytes Absolute 0.3  0.1 - 1.0 K/uL   Eosinophils Relative 1  0 - 5 %   Eosinophils Absolute 0.1  0.0 - 0.7 K/uL   Basophils Relative 0  0 - 1 %   Basophils Absolute 0.0  0.0 - 0.1 K/uL   COMPREHENSIVE METABOLIC PANEL  Result Value Range   Sodium 144  135 - 145 mEq/L   Potassium 2.8 (*) 3.5 - 5.1 mEq/L   Chloride 105  96 - 112 mEq/L   CO2 27  19 - 32 mEq/L   Glucose, Bld 120 (*) 70 - 99 mg/dL   BUN 9  6 - 23 mg/dL   Creatinine, Ser 1.61  0.50 - 1.10 mg/dL   Calcium 09.6  8.4 - 04.5 mg/dL   Total Protein 8.0  6.0 - 8.3 g/dL   Albumin 4.4  3.5 - 5.2 g/dL   AST 16  0 - 37 U/L   ALT 16  0 - 35 U/L   Alkaline Phosphatase 108  39 - 117 U/L   Total Bilirubin 0.4  0.3 - 1.2 mg/dL   GFR calc non Af Amer >90  >90 mL/min   GFR calc Af Amer >90  >90 mL/min  SEDIMENTATION RATE      Result Value Range   Sed Rate 25 (*) 0 - 22 mm/hr  URINALYSIS, ROUTINE W REFLEX MICROSCOPIC      Result Value Range   Color, Urine YELLOW  YELLOW   APPearance CLEAR  CLEAR   Specific Gravity, Urine 1.010  1.005 - 1.030   pH 8.5 (*) 5.0 - 8.0   Glucose, UA NEGATIVE  NEGATIVE mg/dL   Hgb urine dipstick MODERATE (*) NEGATIVE   Bilirubin Urine NEGATIVE  NEGATIVE   Ketones, ur NEGATIVE  NEGATIVE mg/dL   Protein, ur NEGATIVE  NEGATIVE mg/dL   Urobilinogen, UA 0.2  0.0 - 1.0 mg/dL   Nitrite NEGATIVE  NEGATIVE   Leukocytes, UA NEGATIVE  NEGATIVE  LIPASE, BLOOD      Result Value Range   Lipase 39  11 - 59 U/L  URINE MICROSCOPIC-ADD ON      Result Value Range   Squamous Epithelial / LPF FEW (*) RARE   WBC, UA 3-6  <3 WBC/hpf   RBC / HPF 3-6  <3 RBC/hpf   Bacteria, UA FEW (*) RARE  BASIC METABOLIC PANEL      Result Value Range   Sodium 143  135 - 145 mEq/L   Potassium 2.9 (*) 3.5 - 5.1 mEq/L   Chloride 105  96 - 112 mEq/L   CO2 29  19 - 32 mEq/L   Glucose, Bld 97  70 - 99 mg/dL   BUN 7  6 - 23 mg/dL   Creatinine, Ser 4.09  0.50 - 1.10 mg/dL   Calcium 9.6  8.4 - 81.1 mg/dL   GFR calc non Af Amer 77 (*) >90 mL/min   GFR calc Af Amer 89 (*) >90 mL/min  MAGNESIUM      Result Value Range   Magnesium 1.8  1.5 - 2.5 mg/dL   No results found.  DIAGNOSTIC STUDIES: Oxygen Saturation is  100% on room air, normal by my interpretation.    COORDINATION OF CARE: 10:22 AM-Discussed results of colonoscopy revealing a mass and the possibility that the procedure may have left some pain. Will order a CT scan. Treatment plan discussed with patient and patient agrees.  Labs Review Labs Reviewed - No data to display Imaging Review No results found.  EKG Interpretation   None       MDM  No diagnosis found. Patient with abdominal pain with recent colonoscopy. Does not show perforation. Patient with following up with general surgery for the mass. We'll give pain medicine and antispasmodic  I personally performed the services described in this  documentation, which was scribed in my presence. The recorded information has been reviewed and is accurate.    Juliet Rude. Rubin Payor, MD 05/30/13 [phone]

## 2013-05-30 NOTE — ED Notes (Signed)
Pt had colonscopy yesterday. Pt c/o severe lower abd pain since last night. Denies n/v/d-states she has had normal bm since procedure.

## 2013-05-30 NOTE — Telephone Encounter (Signed)
REVIEWED. AGREE. 

## 2013-06-01 ENCOUNTER — Encounter: Payer: Self-pay | Admitting: Internal Medicine

## 2013-06-02 ENCOUNTER — Encounter (HOSPITAL_COMMUNITY): Payer: Self-pay | Admitting: Internal Medicine

## 2013-06-04 ENCOUNTER — Encounter (HOSPITAL_COMMUNITY): Payer: Medicare Other

## 2013-06-04 NOTE — H&P (Signed)
  NTS SOAP Note  Vital Signs:  Vitals as of: 06/03/2013: Systolic 127: Diastolic 86: Heart Rate 95: Temp 97.36F: Height 71ft 7in: Weight 179Lbs 0 Ounces: Pain Level 5: BMI 28.04  BMI : 28.04 kg/m2  Subjective: This 38 Years 54 Months old Female presents for surgical evaluation of colon cancer.Recently underwent colonoscopy by Dr. Jena Gauss and found to have colon cancer.  It is in sigmoid colon region and has been tattooed.  Had some blood per rectum.  Father had colon cancer.  Has nonspecific colon spasm.  Review of Symptoms:  Constitutional:  fatigue,weakness,chills Head:unremarkable    Eyes:unremarkable   sinus problems Cardiovascular:  unremarkable   Respiratory:  wheezing Gastrointestin    abdominal pain Genitourinary:unremarkable     Musculoskeletal:unremarkable   Skin:unremarkable Hematolgic/Lymphatic:unremarkable     Allergic/Immunologic:unremarkable     Past Medical History:    Reviewed   Past Medical History  Psychiatric History:  Anxiety, Depression Allergies: nkda Medications: flexeril, HCTZ, protonix, percocet   Social History:Reviewed  Social History  Preferred Language: English ([place]) Race:  Black or African American Ethnicity: Not Hispanic / Latino Age: 57 Years 11 Months Marital Status:  L Alcohol:  No Recreational drug(s):  No   Smoking Status: Current every day smoker reviewed on 10/12/2011 Started Date: 06/19/1978 Packs per day: 1.00 Functional Status reviewed on mm/dd/yyyy ------------------------------------------------ Bathing: Normal Cooking: Normal Dressing: Normal Driving: Normal Eating: Normal Managing Meds: Normal Oral Care: Normal Shopping: Normal Toileting: Normal Transferring: Normal Walking: Normal Cognitive Status reviewed on mm/dd/yyyy ------------------------------------------------ Attention: Normal Decision Making: Normal Language: Normal Memory: Normal Motor:  Normal Perception: Normal Problem Solving: Normal Visual and Spatial: Normal   Family History:  Reviewed  Family Health History Mother, Deceased; Arterial aneurysm;  Father, Deceased; Colon cancer;     Objective Information: General:  Well appearing, well nourished in no distress. Heart:  RRR, no murmur Lungs:    CTA bilaterally, no wheezes, rhonchi, rales.  Breathing unlabored. Abdomen:Soft, NT/ND, no HSM, no masses.  Assessment:Sigmoid colon cancer    Plan:  scheduled for laparoscopic handassisted partial colectomy on 06/23/13.   Patient Education:Alternative treatments to surgery were discussed with patient (and family).  Risks and benefits  of procedure including bleeding, infection, cardiopulmonary difficulties, the possibility of a blood transfusion, and the possibility of a colostomy were fully explained to the patient (and family) who gave informed consent. Patient/family questions were addressed.  Follow-up:Pending Surgery

## 2013-06-06 ENCOUNTER — Encounter (HOSPITAL_COMMUNITY): Payer: Self-pay | Admitting: Pharmacy Technician

## 2013-06-17 ENCOUNTER — Encounter (HOSPITAL_COMMUNITY): Payer: Self-pay

## 2013-06-17 ENCOUNTER — Ambulatory Visit (HOSPITAL_COMMUNITY)
Admission: RE | Admit: 2013-06-17 | Discharge: 2013-06-17 | Disposition: A | Discharge status: Home / Self Care | Payer: Medicare Other | Source: Ambulatory Visit | Attending: General Surgery | Admitting: General Surgery

## 2013-06-17 ENCOUNTER — Encounter (HOSPITAL_COMMUNITY)
Admission: RE | Admit: 2013-06-17 | Discharge: 2013-06-17 | Disposition: A | Discharge status: Home / Self Care | Payer: Medicare Other | Source: Ambulatory Visit | Attending: General Surgery | Admitting: General Surgery

## 2013-06-17 DIAGNOSIS — I10 Essential (primary) hypertension: Secondary | ICD-10-CM | POA: Insufficient documentation

## 2013-06-17 DIAGNOSIS — Z01818 Encounter for other preprocedural examination: Secondary | ICD-10-CM | POA: Insufficient documentation

## 2013-06-17 DIAGNOSIS — Z01812 Encounter for preprocedural laboratory examination: Secondary | ICD-10-CM | POA: Insufficient documentation

## 2013-06-17 DIAGNOSIS — C189 Malignant neoplasm of colon, unspecified: Secondary | ICD-10-CM | POA: Insufficient documentation

## 2013-06-17 LAB — CBC WITH DIFFERENTIAL/PLATELET
Basophils Absolute: 0 10*3/uL (ref 0.0–0.1)
Eosinophils Absolute: 0.2 10*3/uL (ref 0.0–0.7)
HCT: 37.9 % (ref 36.0–46.0)
Lymphocytes Relative: 40 % (ref 12–46)
MCH: 29.5 pg (ref 26.0–34.0)
MCHC: 34.3 g/dL (ref 30.0–36.0)
Monocytes Absolute: 0.3 10*3/uL (ref 0.1–1.0)
Monocytes Relative: 7 % (ref 3–12)
Neutro Abs: 2.1 10*3/uL (ref 1.7–7.7)
Neutrophils Relative %: 47 % (ref 43–77)
Platelets: 226 10*3/uL (ref 150–400)
RDW: 14.8 % (ref 11.5–15.5)
WBC: 4.4 10*3/uL (ref 4.0–10.5)

## 2013-06-17 LAB — COMPREHENSIVE METABOLIC PANEL
ALT: 10 U/L (ref 0–35)
AST: 13 U/L (ref 0–37)
Albumin: 4 g/dL (ref 3.5–5.2)
Alkaline Phosphatase: 106 U/L (ref 39–117)
Calcium: 10.4 mg/dL (ref 8.4–10.5)
Chloride: 105 mEq/L (ref 96–112)
Glucose, Bld: 58 mg/dL — ABNORMAL LOW (ref 70–99)
Potassium: 3.9 mEq/L (ref 3.7–5.3)
Sodium: 147 mEq/L (ref 137–147)
Total Bilirubin: 0.4 mg/dL (ref 0.3–1.2)
Total Protein: 7.6 g/dL (ref 6.0–8.3)

## 2013-06-17 LAB — SURGICAL PCR SCREEN: MRSA, PCR: POSITIVE — AB

## 2013-06-17 NOTE — Patient Instructions (Signed)
Ashley Patrick  06/17/2013   Your procedure is scheduled on:   06/23/2013  Report to [facility] at  615  AM.  Call this number if you have problems the morning of surgery: [phone]   Remember:   Do not eat food or drink liquids after midnight.   Take these medicines the morning of surgery with A SIP OF WATER:  Lotrel, microzide, percocet   Do not wear jewelry, make-up or nail polish.  Do not wear lotions, powders, or perfumes.   Do not shave 48 hours prior to surgery. Men may shave face and neck.  Do not bring valuables to the hospital.  [facility] is not responsible for any belongings or valuables.               Contacts, dentures or bridgework may not be worn into surgery.  Leave suitcase in the car. After surgery it may be brought to your room.  For patients admitted to the hospital, discharge time is determined by your treatment team.               Patients discharged the day of surgery will not be allowed to drive  home.  Name and phone number of your driver: family  Special Instructions: Shower using CHG 2 nights before surgery and the night before surgery.  If you shower the day of surgery use CHG.  Use special wash - you have one bottle of CHG for all showers.  You should use approximately 1/3 of the bottle for each shower.   Please read over the following fact sheets that you were given: Pain Booklet, Coughing and Deep Breathing, Surgical Site Infection Prevention, Anesthesia Post-op Instructions and Care and Recovery After Surgery Laparoscopic Colon Resection Laparoscopic colon resection is a relatively new procedure and is not performed in all centers. It may be done to remove a piece of the colon (large intestine) that may be sore and reddened (inflamed). It may be done to remove a portion of bowel that is blocked. The intestine may be blocked because of colon cancer. It is sometimes used to treat diseases of the bowel in which there are multiple small outgrowths  from the bowel wall (polyps), which may predispose a person to cancer. LET YOUR CAREGIVER KNOW ABOUT:  Allergies.  Medications taken including herbs, eye drops, over the counter medications, and creams.  Use of steroids (by mouth or creams).  Previous problems with anesthetics or novocaine.  Possibility of pregnancy, if this applies.  History of blood clots (thrombophlebitis).  History of bleeding or blood problems.  Previous surgery.  Other health problems. RISKS AND COMPLICATIONS Some problems, which occur following this procedure, include:  Infection: A germ starts growing in the wound. This can usually be treated with medicine that kills germs (antibiotics).  Bleeding following surgery may be a complication of almost all surgeries. Your surgeon takes every precaution to keep this from happening.  Damage to other organs may occur. If damage to other organs or excessive bleeding should occur it may be necessary to convert the laparoscopic procedure into an open abdominal (belly) procedure. This means the surgery is performed by opening the abdomen and performing the surgery under direct vision. Scarring from previous surgeries or disease may also be a cause to change this procedure to an open abdominal operation.  Sometimes a leak can occur in the line where the bowel was sewn together after the portion of bowel was removed.  It is  possible for the bowel to become obstructed in the area where it was sewn together. When this happens, it is sometimes necessary to operate again to repair this. This may be accomplished using the laparoscope or opening the abdomen and operating in the usual manner without the laparoscope. BEFORE THE PROCEDURE You should be present 2 hours prior to your procedure or as instructed.  PROCEDURE  Laparoscopic means a laparoscope (a small pencil sized telescope) is used. You are made to sleep with medicine (anesthetized). Your surgeon inflates your belly  (abdomen) with a needle like device (trocar and cannula). The inflation is done with a harmless gas (carbon dioxide). This makes your organs easier to see. The laparoscope is inserted into your abdomen through a small slit (incision) that allows your surgeon to see into the abdomen. Other small instruments, such as probes and operating instruments, are inserted into the abdomen through other small openings (ports). These ports allow the surgeon to perform the operation. Often surgeons attach a video camera to the laparoscope to enlarge the view. During the procedure the portion of bowel to be removed is taken out through one of the ports. A port may have to be enlarged if the bowel is too large to be removed. In this case a small incision will be made and some times the bowel is reconnected (anastamosis) outside the abdomen. After the procedure, the gas is released, and your incisions are closed with stitches (sutures). Because these incisions are small (usually less than one-half inch), there is usually minimal discomfort following the procedure. AFTER THE PROCEDURE The recovery time, if there are no problems, is shortened compared to regular surgery. You will rest in a recovery room until you are stable and doing well. Following this, barring other problems you will be allowed to return to your room. Recovery times vary depending on what is found at surgery, the age of the patient, general health, etc. SEEK IMMEDIATE MEDICAL CARE IF:   There is redness, swelling, or increasing pain in the wound area.  Pus is coming from the wound.  An unexplained oral temperature above 102 F (38.9 C) develops or as directed.  You notice a foul smell coming from the wound or dressing.  There is a breaking open of a wound (edges not staying together) after sutures have been removed.  You develop increasing abdominal pain. Document Released: 08/26/2002 Document Revised: 08/28/2011 Document Reviewed:  07/05/2007 [facility] Patient Information 2014 [place], [place]. PATIENT INSTRUCTIONS POST-ANESTHESIA  IMMEDIATELY FOLLOWING SURGERY:  Do not drive or operate machinery for the first twenty four hours after surgery.  Do not make any important decisions for twenty four hours after surgery or while taking narcotic pain medications or sedatives.  If you develop intractable nausea and vomiting or a severe headache please notify your doctor immediately.  FOLLOW-UP:  Please make an appointment with your surgeon as instructed. You do not need to follow up with anesthesia unless specifically instructed to do so.  WOUND CARE INSTRUCTIONS (if applicable):  Keep a dry clean dressing on the anesthesia/puncture wound site if there is drainage.  Once the wound has quit draining you may leave it open to air.  Generally you should leave the bandage intact for twenty four hours unless there is drainage.  If the epidural site drains for more than 36-48 hours please call the anesthesia department.  QUESTIONS?:  Please feel free to call your physician or the hospital operator if you have any questions, and they will be happy to  assist you.

## 2013-06-18 ENCOUNTER — Inpatient Hospital Stay (HOSPITAL_COMMUNITY): Admission: RE | Admit: 2013-06-18 | Payer: Medicare Other | Source: Ambulatory Visit

## 2013-06-18 LAB — CEA: CEA: 1.1 ng/mL (ref 0.0–5.0)

## 2013-06-20 LAB — ABO/RH: ABO/RH(D): O POS

## 2013-06-20 NOTE — Progress Notes (Signed)
Pt's pre-op labwork showed pt was positive by PCR for MRSA and Staph. Dr. Arnoldo Morale office notified. Dr. Arnoldo Morale later notified me that he would start pt on Bactroban protocol morning of surgery.

## 2013-06-23 ENCOUNTER — Inpatient Hospital Stay (HOSPITAL_COMMUNITY)
Admission: RE | Admit: 2013-06-23 | Discharge: 2013-06-27 | DRG: 331 | Disposition: A | Discharge status: Home / Self Care | Payer: Medicare Other | Source: Ambulatory Visit | Attending: General Surgery | Admitting: General Surgery

## 2013-06-23 ENCOUNTER — Inpatient Hospital Stay (HOSPITAL_COMMUNITY): Payer: Medicare Other | Admitting: Anesthesiology

## 2013-06-23 ENCOUNTER — Encounter (HOSPITAL_COMMUNITY): Payer: Medicare Other | Admitting: Anesthesiology

## 2013-06-23 ENCOUNTER — Encounter (HOSPITAL_COMMUNITY)
Admission: RE | Disposition: A | Discharge status: Home / Self Care | Payer: Self-pay | Source: Ambulatory Visit | Attending: General Surgery

## 2013-06-23 DIAGNOSIS — F3289 Other specified depressive episodes: Secondary | ICD-10-CM | POA: Diagnosis present

## 2013-06-23 DIAGNOSIS — C187 Malignant neoplasm of sigmoid colon: Principal | ICD-10-CM | POA: Diagnosis present

## 2013-06-23 DIAGNOSIS — E876 Hypokalemia: Secondary | ICD-10-CM | POA: Diagnosis not present

## 2013-06-23 DIAGNOSIS — E669 Obesity, unspecified: Secondary | ICD-10-CM | POA: Diagnosis present

## 2013-06-23 DIAGNOSIS — F329 Major depressive disorder, single episode, unspecified: Secondary | ICD-10-CM | POA: Diagnosis present

## 2013-06-23 DIAGNOSIS — Z800 Family history of malignant neoplasm of digestive organs: Secondary | ICD-10-CM

## 2013-06-23 DIAGNOSIS — Z6828 Body mass index (BMI) 28.0-28.9, adult: Secondary | ICD-10-CM

## 2013-06-23 DIAGNOSIS — F411 Generalized anxiety disorder: Secondary | ICD-10-CM | POA: Diagnosis present

## 2013-06-23 DIAGNOSIS — C189 Malignant neoplasm of colon, unspecified: Secondary | ICD-10-CM | POA: Diagnosis present

## 2013-06-23 DIAGNOSIS — F17200 Nicotine dependence, unspecified, uncomplicated: Secondary | ICD-10-CM | POA: Diagnosis present

## 2013-06-23 HISTORY — PX: COLON RESECTION: SHX5231

## 2013-06-23 LAB — GLUCOSE, CAPILLARY: Glucose-Capillary: 96 mg/dL (ref 70–99)

## 2013-06-23 SURGERY — COLECTOMY, HAND-ASSISTED, LAPAROSCOPIC
Anesthesia: General | Site: Abdomen

## 2013-06-23 MED ORDER — SUCCINYLCHOLINE CHLORIDE 20 MG/ML IJ SOLN
INTRAMUSCULAR | Status: DC | PRN
  Administered 2013-06-23: 100 mg via INTRAVENOUS

## 2013-06-23 MED ORDER — OXYCODONE-ACETAMINOPHEN 5-325 MG PO TABS
1.0000 | ORAL_TABLET | Freq: Four times a day (QID) | ORAL | Status: DC | PRN
  Administered 2013-06-23 (×2): 1 via ORAL
  Filled 2013-06-23 (×2): qty 1

## 2013-06-23 MED ORDER — GLYCOPYRROLATE 0.2 MG/ML IJ SOLN
INTRAMUSCULAR | Status: AC
  Filled 2013-06-23: qty 3

## 2013-06-23 MED ORDER — CHLORHEXIDINE GLUCONATE CLOTH 2 % EX PADS
6.0000 | MEDICATED_PAD | Freq: Every day | CUTANEOUS | Status: DC
  Administered 2013-06-24 – 2013-06-27 (×4): 6 via TOPICAL

## 2013-06-23 MED ORDER — POVIDONE-IODINE 10 % OINT PACKET
TOPICAL_OINTMENT | CUTANEOUS | Status: DC | PRN
  Administered 2013-06-23: 2 via TOPICAL

## 2013-06-23 MED ORDER — LACTATED RINGERS IV SOLN
INTRAVENOUS | Status: DC
  Administered 2013-06-23 – 2013-06-24 (×3): via INTRAVENOUS

## 2013-06-23 MED ORDER — ALVIMOPAN 12 MG PO CAPS
ORAL_CAPSULE | ORAL | Status: AC
  Filled 2013-06-23: qty 1

## 2013-06-23 MED ORDER — HYDROCHLOROTHIAZIDE 12.5 MG PO CAPS
12.5000 mg | ORAL_CAPSULE | Freq: Every day | ORAL | Status: DC
  Administered 2013-06-24 – 2013-06-26 (×3): 12.5 mg via ORAL
  Filled 2013-06-23 (×5): qty 1

## 2013-06-23 MED ORDER — SUFENTANIL CITRATE 50 MCG/ML IV SOLN
INTRAVENOUS | Status: DC | PRN
  Administered 2013-06-23: 20 ug via INTRAVENOUS
  Administered 2013-06-23: 10 ug via INTRAVENOUS
  Administered 2013-06-23: 5 ug via INTRAVENOUS
  Administered 2013-06-23: 20 ug via INTRAVENOUS
  Administered 2013-06-23: 10 ug via INTRAVENOUS
  Administered 2013-06-23: 5 ug via INTRAVENOUS
  Administered 2013-06-23: 10 ug via INTRAVENOUS

## 2013-06-23 MED ORDER — GLYCOPYRROLATE 0.2 MG/ML IJ SOLN
INTRAMUSCULAR | Status: AC
  Filled 2013-06-23: qty 1

## 2013-06-23 MED ORDER — LACTATED RINGERS IV SOLN
INTRAVENOUS | Status: DC | PRN
  Administered 2013-06-23 (×4): via INTRAVENOUS

## 2013-06-23 MED ORDER — ONDANSETRON HCL 4 MG/2ML IJ SOLN: 4.0000 mg | Freq: Once | INTRAMUSCULAR | Status: DC | PRN

## 2013-06-23 MED ORDER — PROPOFOL 10 MG/ML IV BOLUS
INTRAVENOUS | Status: DC | PRN
  Administered 2013-06-23: 140 mg via INTRAVENOUS

## 2013-06-23 MED ORDER — MUPIROCIN 2 % EX OINT
TOPICAL_OINTMENT | CUTANEOUS | Status: AC
  Filled 2013-06-23: qty 22

## 2013-06-23 MED ORDER — CEFOTETAN DISODIUM-DEXTROSE 2-2.08 GM-% IV SOLR
INTRAVENOUS | Status: AC
  Filled 2013-06-23: qty 50

## 2013-06-23 MED ORDER — SUFENTANIL CITRATE 50 MCG/ML IV SOLN
INTRAVENOUS | Status: AC
  Filled 2013-06-23: qty 1

## 2013-06-23 MED ORDER — ONDANSETRON HCL 4 MG/2ML IJ SOLN
4.0000 mg | Freq: Once | INTRAMUSCULAR | Status: AC
  Administered 2013-06-23: 4 mg via INTRAVENOUS

## 2013-06-23 MED ORDER — MUPIROCIN 2 % EX OINT
1.0000 "application " | TOPICAL_OINTMENT | Freq: Two times a day (BID) | CUTANEOUS | Status: DC
  Administered 2013-06-23 – 2013-06-26 (×7): 1 via NASAL
  Filled 2013-06-23 (×2): qty 22

## 2013-06-23 MED ORDER — EPHEDRINE SULFATE 50 MG/ML IJ SOLN
INTRAMUSCULAR | Status: AC
  Filled 2013-06-23: qty 1

## 2013-06-23 MED ORDER — PROPOFOL 10 MG/ML IV EMUL
INTRAVENOUS | Status: AC
  Filled 2013-06-23: qty 20

## 2013-06-23 MED ORDER — MIDAZOLAM HCL 2 MG/2ML IJ SOLN
INTRAMUSCULAR | Status: AC
  Filled 2013-06-23: qty 2

## 2013-06-23 MED ORDER — GLYCOPYRROLATE 0.2 MG/ML IJ SOLN
INTRAMUSCULAR | Status: DC | PRN
  Administered 2013-06-23: 0.6 mg via INTRAVENOUS

## 2013-06-23 MED ORDER — SODIUM CHLORIDE 0.9 % IR SOLN
Status: DC | PRN
  Administered 2013-06-23 (×3): 1000 mL

## 2013-06-23 MED ORDER — ROCURONIUM BROMIDE 50 MG/5ML IV SOLN
INTRAVENOUS | Status: AC
  Filled 2013-06-23: qty 1

## 2013-06-23 MED ORDER — LIDOCAINE HCL (CARDIAC) 20 MG/ML IV SOLN
INTRAVENOUS | Status: DC | PRN
  Administered 2013-06-23: 50 mg via INTRAVENOUS

## 2013-06-23 MED ORDER — FENTANYL CITRATE 0.05 MG/ML IJ SOLN
25.0000 ug | INTRAMUSCULAR | Status: AC
  Administered 2013-06-23 (×2): 25 ug via INTRAVENOUS

## 2013-06-23 MED ORDER — CHLORHEXIDINE GLUCONATE CLOTH 2 % EX PADS: 6.0000 | MEDICATED_PAD | Freq: Every day | CUTANEOUS | Status: DC

## 2013-06-23 MED ORDER — EPHEDRINE SULFATE 50 MG/ML IJ SOLN
INTRAMUSCULAR | Status: DC | PRN
  Administered 2013-06-23 (×2): 10 mg via INTRAVENOUS

## 2013-06-23 MED ORDER — MUPIROCIN 2 % EX OINT
1.0000 "application " | TOPICAL_OINTMENT | Freq: Two times a day (BID) | CUTANEOUS | Status: DC
  Administered 2013-06-23: 1 via NASAL

## 2013-06-23 MED ORDER — NEOSTIGMINE METHYLSULFATE 1 MG/ML IJ SOLN
INTRAMUSCULAR | Status: AC
  Filled 2013-06-23: qty 1

## 2013-06-23 MED ORDER — ACETAMINOPHEN 500 MG PO TABS
1000.0000 mg | ORAL_TABLET | Freq: Four times a day (QID) | ORAL | Status: DC
Start: 2013-06-23 — End: 2013-06-24
  Administered 2013-06-23 (×3): 1000 mg via ORAL
  Filled 2013-06-23 (×4): qty 2

## 2013-06-23 MED ORDER — PANTOPRAZOLE SODIUM 40 MG PO TBEC
40.0000 mg | DELAYED_RELEASE_TABLET | Freq: Every day | ORAL | Status: DC
Start: 2013-06-23 — End: 2013-06-27
  Administered 2013-06-23 – 2013-06-26 (×4): 40 mg via ORAL
  Filled 2013-06-23 (×4): qty 1

## 2013-06-23 MED ORDER — HYDROMORPHONE HCL PF 1 MG/ML IJ SOLN
1.0000 mg | INTRAMUSCULAR | Status: DC | PRN
  Administered 2013-06-23 – 2013-06-24 (×5): 1 mg via INTRAVENOUS
  Filled 2013-06-23 (×5): qty 1

## 2013-06-23 MED ORDER — BUPIVACAINE HCL (PF) 0.5 % IJ SOLN
INTRAMUSCULAR | Status: DC | PRN
  Administered 2013-06-23: 10 mL

## 2013-06-23 MED ORDER — ARTIFICIAL TEARS OP OINT
TOPICAL_OINTMENT | OPHTHALMIC | Status: AC
  Filled 2013-06-23: qty 3.5

## 2013-06-23 MED ORDER — ENOXAPARIN SODIUM 40 MG/0.4ML ~~LOC~~ SOLN
40.0000 mg | Freq: Once | SUBCUTANEOUS | Status: AC
  Administered 2013-06-23: 40 mg via SUBCUTANEOUS

## 2013-06-23 MED ORDER — ALVIMOPAN 12 MG PO CAPS
12.0000 mg | ORAL_CAPSULE | Freq: Two times a day (BID) | ORAL | Status: DC
  Administered 2013-06-24 – 2013-06-26 (×6): 12 mg via ORAL
  Filled 2013-06-23 (×6): qty 1

## 2013-06-23 MED ORDER — GLYCOPYRROLATE 0.2 MG/ML IJ SOLN
0.2000 mg | Freq: Once | INTRAMUSCULAR | Status: AC
  Administered 2013-06-23: 0.2 mg via INTRAVENOUS

## 2013-06-23 MED ORDER — LACTATED RINGERS IV SOLN
INTRAVENOUS | Status: DC
  Administered 2013-06-23: 1000 mL via INTRAVENOUS

## 2013-06-23 MED ORDER — ONDANSETRON HCL 4 MG/2ML IJ SOLN
4.0000 mg | Freq: Four times a day (QID) | INTRAMUSCULAR | Status: DC | PRN
Start: 2013-06-23 — End: 2013-06-27
  Administered 2013-06-23 – 2013-06-24 (×2): 4 mg via INTRAVENOUS
  Filled 2013-06-23 (×2): qty 2

## 2013-06-23 MED ORDER — ONDANSETRON HCL 4 MG PO TABS: 4.0000 mg | ORAL_TABLET | Freq: Four times a day (QID) | ORAL | Status: DC | PRN

## 2013-06-23 MED ORDER — ALVIMOPAN 12 MG PO CAPS
12.0000 mg | ORAL_CAPSULE | Freq: Once | ORAL | Status: AC
  Administered 2013-06-23: 12 mg via ORAL

## 2013-06-23 MED ORDER — MIDAZOLAM HCL 5 MG/5ML IJ SOLN
INTRAMUSCULAR | Status: DC | PRN
  Administered 2013-06-23: 2 mg via INTRAVENOUS

## 2013-06-23 MED ORDER — SUCCINYLCHOLINE CHLORIDE 20 MG/ML IJ SOLN
INTRAMUSCULAR | Status: AC
  Filled 2013-06-23: qty 1

## 2013-06-23 MED ORDER — POVIDONE-IODINE 10 % EX OINT
TOPICAL_OINTMENT | CUTANEOUS | Status: AC
  Filled 2013-06-23: qty 2

## 2013-06-23 MED ORDER — ROCURONIUM BROMIDE 100 MG/10ML IV SOLN
INTRAVENOUS | Status: DC | PRN
  Administered 2013-06-23: 40 mg via INTRAVENOUS
  Administered 2013-06-23: 10 mg via INTRAVENOUS

## 2013-06-23 MED ORDER — BENAZEPRIL HCL 10 MG PO TABS
10.0000 mg | ORAL_TABLET | Freq: Every day | ORAL | Status: DC
  Administered 2013-06-24 – 2013-06-26 (×3): 10 mg via ORAL
  Filled 2013-06-23 (×5): qty 1

## 2013-06-23 MED ORDER — ONDANSETRON HCL 4 MG/2ML IJ SOLN
INTRAMUSCULAR | Status: AC
  Filled 2013-06-23: qty 2

## 2013-06-23 MED ORDER — SODIUM CHLORIDE 0.9 % IJ SOLN
INTRAMUSCULAR | Status: AC
  Filled 2013-06-23: qty 10

## 2013-06-23 MED ORDER — SIMETHICONE 80 MG PO CHEW
80.0000 mg | CHEWABLE_TABLET | Freq: Four times a day (QID) | ORAL | Status: DC | PRN
  Administered 2013-06-24: 80 mg via ORAL
  Filled 2013-06-23: qty 1

## 2013-06-23 MED ORDER — CEFOTETAN DISODIUM-DEXTROSE 2-2.08 GM-% IV SOLR
2.0000 g | INTRAVENOUS | Status: AC
  Administered 2013-06-23: 2 g via INTRAVENOUS

## 2013-06-23 MED ORDER — FENTANYL CITRATE 0.05 MG/ML IJ SOLN
INTRAMUSCULAR | Status: AC
  Filled 2013-06-23: qty 2

## 2013-06-23 MED ORDER — AMLODIPINE BESYLATE 5 MG PO TABS
5.0000 mg | ORAL_TABLET | Freq: Every day | ORAL | Status: DC
  Administered 2013-06-24 – 2013-06-26 (×3): 5 mg via ORAL
  Filled 2013-06-23 (×5): qty 1

## 2013-06-23 MED ORDER — SODIUM CHLORIDE BACTERIOSTATIC 0.9 % IJ SOLN
INTRAMUSCULAR | Status: AC
  Filled 2013-06-23: qty 10

## 2013-06-23 MED ORDER — LIDOCAINE HCL (PF) 1 % IJ SOLN
INTRAMUSCULAR | Status: AC
  Filled 2013-06-23: qty 5

## 2013-06-23 MED ORDER — AMLODIPINE BESY-BENAZEPRIL HCL 5-10 MG PO CAPS
1.0000 | ORAL_CAPSULE | Freq: Every day | ORAL | Status: DC
Start: 2013-06-23 — End: 2013-06-23

## 2013-06-23 MED ORDER — NEOSTIGMINE METHYLSULFATE 1 MG/ML IJ SOLN
INTRAMUSCULAR | Status: DC | PRN
  Administered 2013-06-23: 4 mg via INTRAVENOUS

## 2013-06-23 MED ORDER — DEXAMETHASONE SODIUM PHOSPHATE 4 MG/ML IJ SOLN
4.0000 mg | Freq: Once | INTRAMUSCULAR | Status: AC
  Administered 2013-06-23: 4 mg via INTRAVENOUS

## 2013-06-23 MED ORDER — ENOXAPARIN SODIUM 40 MG/0.4ML ~~LOC~~ SOLN
40.0000 mg | SUBCUTANEOUS | Status: DC
Start: 2013-06-24 — End: 2013-06-27
  Administered 2013-06-24 – 2013-06-27 (×4): 40 mg via SUBCUTANEOUS
  Filled 2013-06-23 (×4): qty 0.4

## 2013-06-23 MED ORDER — BUPIVACAINE HCL (PF) 0.5 % IJ SOLN
INTRAMUSCULAR | Status: AC
  Filled 2013-06-23: qty 30

## 2013-06-23 MED ORDER — MIDAZOLAM HCL 2 MG/2ML IJ SOLN
1.0000 mg | INTRAMUSCULAR | Status: DC | PRN
  Administered 2013-06-23 (×2): 1 mg via INTRAVENOUS

## 2013-06-23 MED ORDER — ENOXAPARIN SODIUM 40 MG/0.4ML ~~LOC~~ SOLN
SUBCUTANEOUS | Status: AC
  Filled 2013-06-23: qty 0.4

## 2013-06-23 MED ORDER — FENTANYL CITRATE 0.05 MG/ML IJ SOLN
25.0000 ug | INTRAMUSCULAR | Status: DC | PRN
  Administered 2013-06-23 (×2): 50 ug via INTRAVENOUS
  Filled 2013-06-23: qty 2

## 2013-06-23 MED ORDER — DEXAMETHASONE SODIUM PHOSPHATE 4 MG/ML IJ SOLN
INTRAMUSCULAR | Status: AC
  Filled 2013-06-23: qty 1

## 2013-06-23 SURGICAL SUPPLY — 77 items
BAG HAMPER (MISCELLANEOUS) ×3 IMPLANT
BLADE HEX COATED 2.75 (ELECTRODE) ×3 IMPLANT
BLADE SURG SZ10 CARB STEEL (BLADE) ×3 IMPLANT
CHLORAPREP W/TINT 26ML (MISCELLANEOUS) ×3 IMPLANT
CLOTH BEACON ORANGE TIMEOUT ST (SAFETY) ×3 IMPLANT
COVER LIGHT HANDLE STERIS (MISCELLANEOUS) ×6 IMPLANT
CUTTER FLEX LINEAR 45M (STAPLE) IMPLANT
DECANTER SPIKE VIAL GLASS SM (MISCELLANEOUS) ×3 IMPLANT
DRAPE INCISE IOBAN 44X35 STRL (DRAPES) ×3 IMPLANT
DRAPE PROXIMA HALF (DRAPES) ×3 IMPLANT
DRAPE WARM FLUID 44X44 (DRAPE) ×3 IMPLANT
DRSG OPSITE POSTOP 4X10 (GAUZE/BANDAGES/DRESSINGS) ×3 IMPLANT
DRSG OPSITE POSTOP 4X8 (GAUZE/BANDAGES/DRESSINGS) ×3 IMPLANT
ELECT BLADE 6 FLAT ULTRCLN (ELECTRODE) ×3 IMPLANT
ELECT REM PT RETURN 9FT ADLT (ELECTROSURGICAL) ×3
ELECTRODE REM PT RTRN 9FT ADLT (ELECTROSURGICAL) ×1 IMPLANT
FILTER SMOKE EVAC LAPAROSHD (FILTER) ×3 IMPLANT
GLOVE BIO SURGEON STRL SZ7.5 (GLOVE) ×6 IMPLANT
GLOVE BIOGEL PI IND STRL 7.0 (GLOVE) ×1 IMPLANT
GLOVE BIOGEL PI IND STRL 7.5 (GLOVE) ×2 IMPLANT
GLOVE BIOGEL PI INDICATOR 7.0 (GLOVE) ×2
GLOVE BIOGEL PI INDICATOR 7.5 (GLOVE) ×4
GLOVE ECLIPSE 7.0 STRL STRAW (GLOVE) ×9 IMPLANT
GLOVE ECLIPSE 8.0 STRL XLNG CF (GLOVE) ×3 IMPLANT
GLOVE EXAM NITRILE MD LF STRL (GLOVE) ×3 IMPLANT
GOWN STRL REIN 3XL LVL4 (GOWN DISPOSABLE) ×6 IMPLANT
GOWN STRL REIN XL XLG (GOWN DISPOSABLE) ×15 IMPLANT
INST SET LAPROSCOPIC AP (KITS) ×3 IMPLANT
INST SET MAJOR GENERAL (KITS) ×3 IMPLANT
IV NS IRRIG 3000ML ARTHROMATIC (IV SOLUTION) IMPLANT
KIT ROOM TURNOVER AP CYSTO (KITS) ×3 IMPLANT
LIGASURE 5MM LAPAROSCOPIC (INSTRUMENTS) IMPLANT
LIGASURE IMPACT 36 18CM CVD LR (INSTRUMENTS) ×3 IMPLANT
LIGASURE LAP ATLAS 10MM 37CM (INSTRUMENTS) IMPLANT
MANIFOLD NEPTUNE II (INSTRUMENTS) ×3 IMPLANT
NEEDLE HYPO 18GX1.5 BLUNT FILL (NEEDLE) IMPLANT
NS IRRIG 1000ML POUR BTL (IV SOLUTION) ×9 IMPLANT
PACK LAP CHOLE LZT030E (CUSTOM PROCEDURE TRAY) ×3 IMPLANT
PAD ARMBOARD 7.5X6 YLW CONV (MISCELLANEOUS) ×3 IMPLANT
PENCIL HANDSWITCHING (ELECTRODE) ×9 IMPLANT
RELOAD LINEAR CUT PROX 55 BLUE (ENDOMECHANICALS) IMPLANT
RELOAD PROXIMATE 75MM BLUE (ENDOMECHANICALS) ×3 IMPLANT
RELOAD STAPLE TA45 3.5 REG BLU (ENDOMECHANICALS) IMPLANT
SEALER TISSUE G2 CVD JAW 35 (ENDOMECHANICALS) IMPLANT
SEALER TISSUE G2 CVD JAW 45CM (ENDOMECHANICALS)
SET BASIN LINEN APH (SET/KITS/TRAYS/PACK) ×3 IMPLANT
SET TUBE IRRIG SUCTION NO TIP (IRRIGATION / IRRIGATOR) IMPLANT
SHEET LAVH (DRAPES) ×3 IMPLANT
SPONGE GAUZE 2X2 8PLY STER LF (GAUZE/BANDAGES/DRESSINGS) ×2
SPONGE GAUZE 2X2 8PLY STRL LF (GAUZE/BANDAGES/DRESSINGS) ×4 IMPLANT
SPONGE GAUZE 4X4 12PLY (GAUZE/BANDAGES/DRESSINGS) ×3 IMPLANT
SPONGE LAP 18X18 X RAY DECT (DISPOSABLE) ×6 IMPLANT
STAPLER GUN LINEAR PROX 60 (STAPLE) ×3 IMPLANT
STAPLER PROXIMATE 55 BLUE (STAPLE) IMPLANT
STAPLER PROXIMATE 75MM BLUE (STAPLE) ×3 IMPLANT
STAPLER VISISTAT (STAPLE) ×3 IMPLANT
SUCTION POOLE TIP (SUCTIONS) ×6 IMPLANT
SUT CHROMIC 0 CT 1 (SUTURE) IMPLANT
SUT CHROMIC 2 0 SH (SUTURE) IMPLANT
SUT PDS AB 0 CTX 60 (SUTURE) ×6 IMPLANT
SUT PDS AB CT VIOLET #0 27IN (SUTURE) IMPLANT
SUT SILK 2 0 (SUTURE)
SUT SILK 2-0 18XBRD TIE 12 (SUTURE) IMPLANT
SUT SILK 3 0 SH CR/8 (SUTURE) ×3 IMPLANT
SUT VIC AB 0 CT1 27 (SUTURE)
SUT VIC AB 0 CT1 27XCR 8 STRN (SUTURE) IMPLANT
SUT VIC AB 2-0 CT2 27 (SUTURE) IMPLANT
SUT VICRYL 0 UR6 27IN ABS (SUTURE) IMPLANT
SYS LAPSCP GELPORT 120MM (MISCELLANEOUS) ×3
SYSTEM LAPSCP GELPORT 120MM (MISCELLANEOUS) ×1 IMPLANT
TRAY FOLEY CATH 16FR SILVER (SET/KITS/TRAYS/PACK) ×3 IMPLANT
TROCAR ENDO BLADELESS 11MM (ENDOMECHANICALS) ×3 IMPLANT
TROCAR XCEL NON-BLD 5MMX100MML (ENDOMECHANICALS) ×3 IMPLANT
TROCAR XCEL UNIV SLVE 11M 100M (ENDOMECHANICALS) ×3 IMPLANT
TUBING INSUF HEATED (TUBING) ×3 IMPLANT
WARMER LAPAROSCOPE (MISCELLANEOUS) ×3 IMPLANT
YANKAUER SUCT BULB TIP 10FT TU (MISCELLANEOUS) ×9 IMPLANT

## 2013-06-23 NOTE — Progress Notes (Signed)
UR chart review completed.  

## 2013-06-23 NOTE — Anesthesia Procedure Notes (Signed)
Procedure Name: Intubation Date/Time: 06/23/2013 7:44 AM Performed by: Andree Elk, Danell Verno A Pre-anesthesia Checklist: Patient identified, Patient being monitored, Timeout performed, Emergency Drugs available and Suction available Patient Re-evaluated:Patient Re-evaluated prior to inductionOxygen Delivery Method: Circle System Utilized Preoxygenation: Pre-oxygenation with 100% oxygen Intubation Type: IV induction, Cricoid Pressure applied and Rapid sequence Ventilation: Mask ventilation without difficulty Laryngoscope Size: 3 and Miller Grade View: Grade I Tube type: Oral Tube size: 7.0 mm Number of attempts: 1 Airway Equipment and Method: stylet Placement Confirmation: ETT inserted through vocal cords under direct vision,  positive ETCO2 and breath sounds checked- equal and bilateral Secured at: 21 cm Tube secured with: Tape Dental Injury: Teeth and Oropharynx as per pre-operative assessment

## 2013-06-23 NOTE — Transfer of Care (Signed)
Immediate Anesthesia Transfer of Care Note  Patient: Ashley Patrick  Procedure(s) Performed: Procedure(s): HAND ASSISTED LAPAROSCOPIC PARTIAL COLECTOMY (N/A)  Patient Location: PACU  Anesthesia Type:General  Level of Consciousness: sedated and patient cooperative  Airway & Oxygen Therapy: Patient Spontanous Breathing and Patient connected to face mask oxygen  Post-op Assessment: Report given to PACU RN and Post -op Vital signs reviewed and stable  Post vital signs: Reviewed and stable  Complications: No apparent anesthesia complications

## 2013-06-23 NOTE — Interval H&P Note (Signed)
History and Physical Interval Note:  06/23/2013 7:19 AM  Ashley Patrick  has presented today for surgery, with the diagnosis of colon tumor  The various methods of treatment have been discussed with the patient and family. After consideration of risks, benefits and other options for treatment, the patient has consented to  Procedure(s): HAND ASSISTED LAPAROSCOPIC PARTIAL COLECTOMY (N/A) as a surgical intervention .  The patient's history has been reviewed, patient examined, no change in status, stable for surgery.  I have reviewed the patient's chart and labs.  Questions were answered to the patient's satisfaction.     Aviva Signs A

## 2013-06-23 NOTE — Preoperative (Signed)
Beta Blockers   Reason not to administer Beta Blockers:Not Applicable 

## 2013-06-23 NOTE — Anesthesia Postprocedure Evaluation (Signed)
  Anesthesia Post-op Note  Patient: Ashley Patrick  Procedure(s) Performed: Procedure(s): HAND ASSISTED LAPAROSCOPIC PARTIAL COLECTOMY (N/A)  Patient Location: PACU  Anesthesia Type:General  Level of Consciousness: awake, alert , oriented and patient cooperative  Airway and Oxygen Therapy: Patient Spontanous Breathing and Patient connected to face mask oxygen  Post-op Pain: mild  Post-op Assessment: Post-op Vital signs reviewed, Patient's Cardiovascular Status Stable, Respiratory Function Stable, Patent Airway, No signs of Nausea or vomiting and Pain level controlled  Post-op Vital Signs: Reviewed and stable  Complications: No apparent anesthesia complications

## 2013-06-23 NOTE — Op Note (Signed)
Patient:  Ashley Patrick  DOB:  [birth year]  MRN:  [id number]   Preop Diagnosis:  Colon carcinoma  Postop Diagnosis:  Same  Procedure:  Laparoscopic hand-assisted partial colectomy, splenic flexure take down  Surgeon:  Aviva Signs, M.D.  Anes:  General endotracheal  Indications: Patient is a 58 year old black female was found on colonoscopy to have adenocarcinoma of the sigmoid colon. She now presents for a laparoscopic hand-assisted partial colectomy. The risks and benefits of the procedure including bleeding, infection, cardiopulmonary difficulties, and the possibility of a blood transfusion were fully explained to the patient, who gave informed consent.  Procedure note:  The patient is placed the supine position. After induction of general endotracheal anesthesia, the patient was placed in the low lithotomy position. Abdomen was prepped and draped using usual sterile technique with DuraPrep. Surgical site confirmation was performed.  The lower midline incision was made down to the fascia. A GelPort was then inserted. An additional 11 mm trocar was placed in right upper quadrant region and another was placed left lower quadrant region. The abdomen was then insufflated to 16 mm mercury pressure. The liver was inspected and no evidence of liver metastasis were noted. The left colon was mobilized along its peritoneal reflection up to the splenic flexure. The splenic flexure was taken down using Endo Shears and cautery without difficulty. The tumor was palpated in the distal sigmoid colon. The sigmoid colon was mobilized along its peritoneal reflection. Care was taken to avoid the left ureter. Both ovaries were identified. No uterus was identified. Once the descending colon and sigmoid colon regions were mobilized, they were brought out through the GelPort incision site. A GIA stapler was placed at the colorectal juncture in the distal sigmoid colon and fired. This was likewise done in the mid  descending colon. The mesentery was divided using the LigaSure. Adequate margins were noted on either side of the tumor. The specimen was sent to pathology further examination. A side to side anastomosis from the descending colon to the colorectal juncture was performed using a GIA 75 stapler. The colotomy was closed using a TA 60 stapler. The stapler was posterior using 3-0 silk sutures. Surrounding adipose tissue was placed over the anastomosis. All operating personnel then changed their gowns and gloves. The abdominal cavity was copiously irrigated with normal saline. The anastomosis was again inspected and no anastomotic disruption was noted. A patent anastomosis was also noted. The fascia along the midline was reapproximated using a loop to PDS suture. All wounds were irrigated normal saline and all wounds were closed using staples. 0.5% Sensorcaine was instilled the surrounding wound. Betadine ointment and dressed a dressings were applied.  All tape and needle counts were correct at the end of the procedure. The patient was extubated in the operating room and transferred to PACU in stable condition.  Complications:  None  EBL:  50 cc  Specimen:  Sigmoid colon, tumor distal

## 2013-06-23 NOTE — Anesthesia Preprocedure Evaluation (Signed)
Anesthesia Evaluation  Patient identified by MRN, date of birth, ID band Patient awake    Reviewed: Allergy & Precautions, H&P , NPO status , Patient's Chart, lab work & pertinent test results  History of Anesthesia Complications (+) PONV and history of anesthetic complications  Airway Mallampati: I TM Distance: >3 FB     Dental  (+) Teeth Intact   Pulmonary sleep apnea , Current Smoker,  breath sounds clear to auscultation        Cardiovascular hypertension, Pt. on medications Rhythm:Regular Rate:Normal     Neuro/Psych  Headaches, PSYCHIATRIC DISORDERS Depression Bipolar Disorder    GI/Hepatic   Endo/Other    Renal/GU      Musculoskeletal   Abdominal   Peds  Hematology   Anesthesia Other Findings   Reproductive/Obstetrics                           Anesthesia Physical Anesthesia Plan  ASA: II  Anesthesia Plan: General   Post-op Pain Management:    Induction: Intravenous  Airway Management Planned: Oral ETT  Additional Equipment:   Intra-op Plan:   Post-operative Plan: Extubation in OR  Informed Consent: I have reviewed the patients History and Physical, chart, labs and discussed the procedure including the risks, benefits and alternatives for the proposed anesthesia with the patient or authorized representative who has indicated his/her understanding and acceptance.     Plan Discussed with:   Anesthesia Plan Comments:         Anesthesia Quick Evaluation

## 2013-06-24 ENCOUNTER — Encounter (HOSPITAL_COMMUNITY): Payer: Self-pay | Admitting: General Practice

## 2013-06-24 LAB — BASIC METABOLIC PANEL
BUN: 9 mg/dL (ref 6–23)
CALCIUM: 10 mg/dL (ref 8.4–10.5)
CO2: 28 mEq/L (ref 19–32)
CREATININE: 0.79 mg/dL (ref 0.50–1.10)
Chloride: 100 mEq/L (ref 96–112)
GFR calc Af Amer: >90 mL/min (ref >90)
Glucose, Bld: 107 mg/dL — ABNORMAL HIGH (ref 70–99)
Potassium: 3.8 mEq/L (ref 3.7–5.3)
SODIUM: 140 meq/L (ref 137–147)

## 2013-06-24 LAB — CBC
HCT: 34.1 % — ABNORMAL LOW (ref 36.0–46.0)
Hemoglobin: 12 g/dL (ref 12.0–15.0)
MCH: 29.6 pg (ref 26.0–34.0)
MCHC: 35.2 g/dL (ref 30.0–36.0)
MCV: 84.2 fL (ref 78.0–100.0)
Platelets: 211 10*3/uL (ref 150–400)
RBC: 4.05 MIL/uL (ref 3.87–5.11)
RDW: 14.9 % (ref 11.5–15.5)
WBC: 9.4 10*3/uL (ref 4.0–10.5)

## 2013-06-24 LAB — MAGNESIUM: MAGNESIUM: 1.6 mg/dL (ref 1.5–2.5)

## 2013-06-24 LAB — PHOSPHORUS: Phosphorus: 5.2 mg/dL — ABNORMAL HIGH (ref 2.3–4.6)

## 2013-06-24 MED ORDER — OXYCODONE HCL 5 MG PO TABS
5.0000 mg | ORAL_TABLET | Freq: Four times a day (QID) | ORAL | Status: DC | PRN
  Administered 2013-06-24: 10 mg via ORAL
  Filled 2013-06-24: qty 2

## 2013-06-24 MED ORDER — ACETAMINOPHEN 325 MG PO TABS
650.0000 mg | ORAL_TABLET | Freq: Once | ORAL | Status: DC
  Filled 2013-06-24: qty 2

## 2013-06-24 MED ORDER — KCL IN DEXTROSE-NACL 20-5-0.45 MEQ/L-%-% IV SOLN
INTRAVENOUS | Status: DC
  Administered 2013-06-24 (×2): via INTRAVENOUS

## 2013-06-24 MED ORDER — ALUM & MAG HYDROXIDE-SIMETH 200-200-20 MG/5ML PO SUSP
30.0000 mL | ORAL | Status: DC | PRN
  Administered 2013-06-24 (×2): 30 mL via ORAL
  Filled 2013-06-24 (×2): qty 30

## 2013-06-24 MED ORDER — FENTANYL CITRATE 0.05 MG/ML IJ SOLN
50.0000 ug | INTRAMUSCULAR | Status: DC | PRN
  Administered 2013-06-24: 50 ug via INTRAVENOUS
  Filled 2013-06-24: qty 2

## 2013-06-24 MED ORDER — ACETAMINOPHEN 500 MG PO TABS
500.0000 mg | ORAL_TABLET | ORAL | Status: DC | PRN
  Administered 2013-06-24 – 2013-06-25 (×2): 500 mg via ORAL
  Filled 2013-06-24 (×2): qty 1

## 2013-06-24 MED ORDER — OXYCODONE HCL 5 MG PO TABS
5.0000 mg | ORAL_TABLET | ORAL | Status: DC | PRN
  Administered 2013-06-24 (×2): 10 mg via ORAL
  Administered 2013-06-25: 5 mg via ORAL
  Administered 2013-06-25 (×2): 10 mg via ORAL
  Administered 2013-06-25 (×2): 5 mg via ORAL
  Administered 2013-06-26 – 2013-06-27 (×6): 10 mg via ORAL
  Filled 2013-06-24 (×7): qty 2
  Filled 2013-06-24 (×2): qty 1
  Filled 2013-06-24 (×4): qty 2

## 2013-06-24 NOTE — Progress Notes (Signed)
Pt in bed, coordination appropriate, responds and follows some commands, still no verbal communication from patient to nurse or family members, Dr. Arnoldo Morale notified of findings, will continue to monitor patient.

## 2013-06-24 NOTE — Progress Notes (Signed)
Pt is now responding appropriately verbally to family and to nurse, alert and oriented to surroundings and situation.

## 2013-06-24 NOTE — Anesthesia Postprocedure Evaluation (Signed)
  Anesthesia Post-op Note  Patient: Ashley Patrick  Procedure(s) Performed: Procedure(s): HAND ASSISTED LAPAROSCOPIC PARTIAL COLECTOMY (N/A)  Patient Location:   Anesthesia Type:General  Level of Consciousness: awake, alert , oriented and patient cooperative  Airway and Oxygen Therapy: Patient Spontanous Breathing  Post-op Pain: 2 /10, mild  Post-op Assessment: Post-op Vital signs reviewed, Patient's Cardiovascular Status Stable, Respiratory Function Stable, Patent Airway, No signs of Nausea or vomiting, Adequate PO intake and Pain level controlled  Post-op Vital Signs: Reviewed and stable  Complications: No apparent anesthesia complications

## 2013-06-24 NOTE — Progress Notes (Signed)
Pt was given Fentanyl 50mg  at 0909 this am for abdominal pain.  Pt was ambulated to the bathroom then to the chair without any difficulty at 0955.  Pt's family member called nurse to the room and the patient was not coherent, did not respond verbally to commands, followed with eyes at times, but not with good eye contact, coordination was not appropriate, notified Dr. Arnoldo Morale of above findings at 1015, VSS with T. 98.2, BP 149/93, P 100, O2 saturation 100% room air, orders given to d/c fentanyl and monitor patient, then call MD back with findings, patient then ambulated with assistance x 2 back to bed from chair, coordination at this time still not appropriate, patient in bed with family at bedside, still no verbal communication, but alert.

## 2013-06-24 NOTE — Progress Notes (Signed)
1 Day Post-Op  Subjective: Mild incisional pain. States the dilaudid seemed to make her feel worse.  Objective: Vital signs in last 24 hours: Temp:  [97.4 F (36.3 C)-99.1 F (37.3 C)] 97.6 F (36.4 C) (01/06 0750) Pulse Rate:  [76-99] 99 (01/06 0750) Resp:  [10-24] 18 (01/06 0750) BP: (107-139)/(52-88) 125/76 mmHg (01/06 0750) SpO2:  [96 %-100 %] 99 % (01/06 0750) Weight:  [79.833 kg (176 lb)] 79.833 kg (176 lb) (01/06 0100) Last BM Date: 06/23/13  Intake/Output from previous day: 01/05 0701 - 01/06 0700 In: 3240 [P.O.:240; I.V.:3000] Out: 4900 [Urine:4850; Blood:50] Intake/Output this shift:    General appearance: alert, cooperative and no distress Resp: clear to auscultation bilaterally Cardio: regular rate and rhythm, S1, S2 normal, no murmur, click, rub or gallop GI: Soft. Incisions healing well. Dressings dry and intact.  Lab Results:   Recent Labs  06/24/13 0553  WBC 9.4  HGB 12.0  HCT 34.1*  PLT 211   BMET  Recent Labs  06/24/13 0553  NA 140  K 3.8  CL 100  CO2 28  GLUCOSE 107*  BUN 9  CREATININE 0.79  CALCIUM 10.0   PT/INR No results found for this basename: LABPROT, INR,  in the last 72 hours  Studies/Results: No results found.  Anti-infectives: Anti-infectives   Start     Dose/Rate Route Frequency Ordered Stop   06/23/13 0616  cefoTEtan in Dextrose 5% (CEFOTAN) IVPB 2 g     2 g Intravenous On call to O.R. 06/23/13 1478 06/23/13 0747      Assessment/Plan: s/p Procedure(s): HAND ASSISTED LAPAROSCOPIC PARTIAL COLECTOMY Impression: Stable on postoperative day one. Will adjust IV fluids and pain medications. Awaiting return of bowel function.  LOS: 1 day    Ashley Patrick A 06/24/2013

## 2013-06-25 ENCOUNTER — Encounter (HOSPITAL_COMMUNITY): Payer: Self-pay | Admitting: General Surgery

## 2013-06-25 LAB — MAGNESIUM: Magnesium: 2 mg/dL (ref 1.5–2.5)

## 2013-06-25 LAB — CBC
HCT: 35.8 % — ABNORMAL LOW (ref 36.0–46.0)
Hemoglobin: 12.5 g/dL (ref 12.0–15.0)
MCH: 29.8 pg (ref 26.0–34.0)
MCHC: 34.9 g/dL (ref 30.0–36.0)
MCV: 85.4 fL (ref 78.0–100.0)
PLATELETS: 218 10*3/uL (ref 150–400)
RBC: 4.19 MIL/uL (ref 3.87–5.11)
RDW: 15.2 % (ref 11.5–15.5)
WBC: 6.5 10*3/uL (ref 4.0–10.5)

## 2013-06-25 LAB — BASIC METABOLIC PANEL
BUN: 7 mg/dL (ref 6–23)
CALCIUM: 9.8 mg/dL (ref 8.4–10.5)
CHLORIDE: 102 meq/L (ref 96–112)
CO2: 26 mEq/L (ref 19–32)
Creatinine, Ser: 0.73 mg/dL (ref 0.50–1.10)
GFR calc non Af Amer: >90 mL/min (ref >90)
Glucose, Bld: 109 mg/dL — ABNORMAL HIGH (ref 70–99)
Potassium: 3.1 mEq/L — ABNORMAL LOW (ref 3.7–5.3)
SODIUM: 141 meq/L (ref 137–147)

## 2013-06-25 LAB — PHOSPHORUS: PHOSPHORUS: 3.7 mg/dL (ref 2.3–4.6)

## 2013-06-25 MED ORDER — KCL IN DEXTROSE-NACL 40-5-0.45 MEQ/L-%-% IV SOLN
INTRAVENOUS | Status: DC
Start: 2013-06-25 — End: 2013-06-27
  Administered 2013-06-25 (×2): via INTRAVENOUS

## 2013-06-25 NOTE — Progress Notes (Signed)
Pt has ambulated twice in hallway with assist from nephew. Pt tolerated fairly both times. Ambulated approximately 200 feet each time.

## 2013-06-25 NOTE — Progress Notes (Signed)
2 Days Post-Op  Subjective: Having intermittent incisional pain. She has had cleared reactions to various IV narcotics. No bowel movement or flatus yet.  Objective: Vital signs in last 24 hours: Temp:  [98.2 F (36.8 C)-98.5 F (36.9 C)] 98.5 F (36.9 C) (01/07 0409) Pulse Rate:  [79-99] 92 (01/07 0409) Resp:  [17-21] 17 (01/07 0409) BP: (119-146)/(73-80) 125/73 mmHg (01/07 0409) SpO2:  [94 %-100 %] 97 % (01/07 0409) Last BM Date: 06/23/13  Intake/Output from previous day: 01/06 0701 - 01/07 0700 In: 2793.8 [P.O.:240; I.V.:2553.8] Out: 1550 [Urine:1550] Intake/Output this shift:    General appearance: cooperative and no distress Resp: clear to auscultation bilaterally Cardio: regular rate and rhythm, S1, S2 normal, no murmur, click, rub or gallop GI: Soft. Occasional bowel sounds appreciated. Incisions healing well.  Lab Results:   Recent Labs  06/24/13 0553 06/25/13 0500  WBC 9.4 6.5  HGB 12.0 12.5  HCT 34.1* 35.8*  PLT 211 218   BMET  Recent Labs  06/24/13 0553 06/25/13 0500  NA 140 141  K 3.8 3.1*  CL 100 102  CO2 28 26  GLUCOSE 107* 109*  BUN 9 7  CREATININE 0.79 0.73  CALCIUM 10.0 9.8   PT/INR No results found for this basename: LABPROT, INR,  in the last 72 hours  Studies/Results: No results found.  Anti-infectives: Anti-infectives   Start     Dose/Rate Route Frequency Ordered Stop   06/23/13 0616  cefoTEtan in Dextrose 5% (CEFOTAN) IVPB 2 g     2 g Intravenous On call to O.R. 06/23/13 5366 06/23/13 0747      Assessment/Plan: s/p Procedure(s): HAND ASSISTED LAPAROSCOPIC PARTIAL COLECTOMY Impression: Postoperative day 2, status post laparoscopic partial colectomy. Awaiting final pathology. Awaiting return of bowel function. Hypokalemia will be addressed. Have encouraged patient to ambulate.  LOS: 2 days    Shiloh Swopes A 06/25/2013

## 2013-06-25 NOTE — Care Management Note (Addendum)
    Page 1 of 1   06/27/2013     11:20:46 AM   CARE MANAGEMENT NOTE 06/27/2013  Patient:  Ashley Patrick, Ashley Patrick   Account Number:  [id number]  Date Initiated:  06/25/2013  Documentation initiated by:  Theophilus Kinds  Subjective/Objective Assessment:   Pt admitted from home s/p partial colectomy. Pt lives alone but has family that will be staying with pt around the clock at discharge. Pt has been independent with ADL's.     Action/Plan:   No CM needs noted.   Anticipated DC Date:  06/28/2013   Anticipated DC Plan:  [place]  CM consult      Choice offered to / List presented to:             Status of service:  Completed, signed off Medicare Important Message given?  YES (If response is "NO", the following Medicare IM given date fields will be blank) Date Medicare IM given:  06/27/2013 Date Additional Medicare IM given:    Discharge Disposition:  HOME/SELF CARE  Per UR Regulation:    If discussed at Long Length of Stay Meetings, dates discussed:    Comments:  06/27/13 [place], RN BSN CM Pt discharged home today. No CM needs noted.  06/25/13 [place], RN BSN CM

## 2013-06-26 LAB — BASIC METABOLIC PANEL
BUN: 6 mg/dL (ref 6–23)
CO2: 25 mEq/L (ref 19–32)
CREATININE: 0.82 mg/dL (ref 0.50–1.10)
Calcium: 9.8 mg/dL (ref 8.4–10.5)
Chloride: 101 mEq/L (ref 96–112)
GFR, EST NON AFRICAN AMERICAN: 79 mL/min — AB (ref >90)
Glucose, Bld: 99 mg/dL (ref 70–99)
POTASSIUM: 3.7 meq/L (ref 3.7–5.3)
Sodium: 139 mEq/L (ref 137–147)

## 2013-06-26 LAB — CBC
HCT: 35.5 % — ABNORMAL LOW (ref 36.0–46.0)
Hemoglobin: 12.3 g/dL (ref 12.0–15.0)
MCH: 29.6 pg (ref 26.0–34.0)
MCHC: 34.6 g/dL (ref 30.0–36.0)
MCV: 85.3 fL (ref 78.0–100.0)
Platelets: 218 10*3/uL (ref 150–400)
RBC: 4.16 MIL/uL (ref 3.87–5.11)
RDW: 15.1 % (ref 11.5–15.5)
WBC: 6.5 10*3/uL (ref 4.0–10.5)

## 2013-06-26 MED ORDER — MAGNESIUM HYDROXIDE 400 MG/5ML PO SUSP
30.0000 mL | Freq: Two times a day (BID) | ORAL | Status: DC
  Administered 2013-06-26 (×2): 30 mL via ORAL
  Filled 2013-06-26 (×2): qty 30

## 2013-06-26 NOTE — Progress Notes (Signed)
3 Days Post-Op  Subjective: Abdominal pain is much less. Better control noted. Patient denies flatus or bowel movement yet.  Objective: Vital signs in last 24 hours: Temp:  [97.1 F (36.2 C)-98.3 F (36.8 C)] 98.1 F (36.7 C) (01/08 0155) Pulse Rate:  [68-94] 94 (01/08 0155) Resp:  [16] 16 (01/08 0155) BP: (121-135)/(78-81) 124/80 mmHg (01/08 0155) SpO2:  [94 %-100 %] 100 % (01/08 0155) Last BM Date: 06/23/13  Intake/Output from previous day: 01/07 0701 - 01/08 0700 In: 572.5 [I.V.:572.5] Out: -  Intake/Output this shift:    General appearance: alert, cooperative and no distress Resp: clear to auscultation bilaterally Cardio: regular rate and rhythm, S1, S2 normal, no murmur, click, rub or gallop GI: Soft. Incisions healing well. Bowel sounds appreciated.  Lab Results:   Recent Labs  06/25/13 0500 06/26/13 0551  WBC 6.5 6.5  HGB 12.5 12.3  HCT 35.8* 35.5*  PLT 218 218   BMET  Recent Labs  06/25/13 0500 06/26/13 0551  NA 141 139  K 3.1* 3.7  CL 102 101  CO2 26 25  GLUCOSE 109* 99  BUN 7 6  CREATININE 0.73 0.82  CALCIUM 9.8 9.8   PT/INR No results found for this basename: LABPROT, INR,  in the last 72 hours  Studies/Results: No results found.  Anti-infectives: Anti-infectives   Start     Dose/Rate Route Frequency Ordered Stop   06/23/13 0616  cefoTEtan in Dextrose 5% (CEFOTAN) IVPB 2 g     2 g Intravenous On call to O.R. 06/23/13 5009 06/23/13 0747      Assessment/Plan: s/p Procedure(s): HAND ASSISTED LAPAROSCOPIC PARTIAL COLECTOMY Impression: Stable on postoperative day 3. Awaiting return of bowel function. Final pathology revealed a T2, N0, M0 adenocarcinoma. Will give cathartics to help facilitate bowel movements.  LOS: 3 days    Denine Brotz A 06/26/2013

## 2013-06-26 NOTE — Plan of Care (Signed)
Problem: Phase II Progression Outcomes Goal: Progress activity as tolerated unless otherwise ordered Outcome: Progressing Up ambulating in the hall Goal: Return of bowel function (flatus, BM) IF ABDOMINAL SURGERY:  Outcome: Progressing Positive bowel sounds, started on MOM

## 2013-06-26 NOTE — Progress Notes (Signed)
UR chart review completed.  

## 2013-06-27 LAB — TYPE AND SCREEN
ABO/RH(D): O POS
Antibody Screen: NEGATIVE
Unit division: 0
Unit division: 0

## 2013-06-27 MED ORDER — OXYCODONE-ACETAMINOPHEN 5-325 MG PO TABS: 1.0000 | ORAL_TABLET | Freq: Four times a day (QID) | ORAL | Status: DC | PRN

## 2013-06-27 NOTE — Discharge Instructions (Signed)
Laparoscopic Cholecystectomy Care After Refer to this sheet in the next few weeks. These instructions provide you with information on caring for yourself after your procedure. Your caregiver may also give you more specific instructions. Your treatment has been planned according to current medical practices, but problems sometimes occur. Call your caregiver if you have any problems or questions after your procedure. HOME CARE INSTRUCTIONS   Change bandages (dressings) as directed by your caregiver.  Keep the wound dry and clean. The wound may be washed gently with soap and water. Gently blot or dab the area dry.  Do not take baths or use swimming pools or hot tubs for 10 days, or as instructed by your caregiver.  Only take over-the-counter or prescription medicines for pain, discomfort, or fever as directed by your caregiver.  Continue your normal diet as directed by your caregiver.  Do not lift anything heavier than 25 pounds (11.5 kg), or as directed by your caregiver.  Do not play contact sports for 1 week, or as directed by your caregiver. SEEK MEDICAL CARE IF:   There is redness, swelling, or increasing pain in the wound.  You notice yellowish-white fluid (pus) coming from the wound.  There is drainage from the wound that lasts longer than 1 day.  There is a bad smell coming from the wound or dressing.  The surgical cut (incision) breaks open. SEEK IMMEDIATE MEDICAL CARE IF:   You develop a rash.  You have difficulty breathing.  You develop chest pain.  You develop any reaction or side effects to medicines given.  You have a fever.  You have increasing pain in the shoulders (shoulder strap areas).  You have dizzy episodes or faint while standing.  You develop severe abdominal pain.  You feel sick to your stomach (nauseous) or throw up (vomit) and this lasts for more than 1 day. MAKE SURE YOU:   Understand these instructions.  Will watch your condition.  Will  get help right away if you are not doing well or get worse. Document Released: 06/05/2005 Document Revised: 08/28/2011 Document Reviewed: 01/15/2013 [facility] Patient Information 2014 [place].

## 2013-06-27 NOTE — Progress Notes (Signed)
Discharge instructions and prescriptions given, verbalized understanding, out in stable condition with staff via w/c. 

## 2013-06-27 NOTE — Discharge Summary (Signed)
Physician Discharge Summary  Patient ID: Ashley Patrick MRN: [id number] DOB/AGE: [birth year] 58 y.o.  Admit date: 06/23/2013 Discharge date: 06/27/2013  Admission Diagnoses: Colon carcinoma  Discharge Diagnoses:  same Active Problems:   Colon cancer   Discharged Condition: good  Hospital Course: Patient is a 58 year old black female who was found on colonoscopy to have a colon carcinoma in the sigmoid colon region. She underwent a laparoscopic hand-assisted partial colectomy on 06/23/2013. She tolerated the procedure well. Her postoperative course has been unremarkable. Her diet was advanced without difficulty once her bowel function returned. Final pathology revealed a T2, N0, M0 adenocarcinoma. Patient and family are aware. She is being discharged home on 06/27/2013 in good improving condition.  Treatments: surgery: Laparoscopic hand-assisted partial colectomy on 06/23/2013  Discharge Exam: Blood pressure 119/72, pulse 88, temperature 98.1 F (36.7 C), temperature source Oral, resp. rate 16, height 5\' 5"  (1.651 m), weight 79.833 kg (176 lb), SpO2 99.00%. General appearance: alert, cooperative and mildly obese Resp: clear to auscultation bilaterally Cardio: regular rate and rhythm, S1, S2 normal, no murmur, click, rub or gallop GI: Soft. Active bowel sounds appreciated. Incisions healing well.  Disposition: 01-Home or Self Care   Future Appointments Provider Department Dept Phone   07/02/2013 8:00 AM [facility] [place] [facility] [phone]   Please wear two piece clothing and wear no powder or deodorant. Please arrive 15 minutes early prior to your appointment time.       Medication List         amLODipine-benazepril 5-10 MG per capsule  Commonly known as:  LOTREL  Take 1 capsule by mouth daily.     dicyclomine 20 MG tablet  Commonly known as:  BENTYL  Take 1 tablet (20 mg total) by mouth 3 (three) times daily as needed for spasms.     hydrochlorothiazide 12.5 MG capsule   Commonly known as:  MICROZIDE  Take 12.5 mg by mouth daily.     oxyCODONE-acetaminophen 5-325 MG per tablet  Commonly known as:  PERCOCET/ROXICET  Take 1-2 tablets by mouth every 6 (six) hours as needed for severe pain.     polyethylene glycol packet  Commonly known as:  MIRALAX / GLYCOLAX  Take 17 g by mouth daily.           Follow-up Information   Follow up with Jamesetta So, MD. Schedule an appointment as soon as possible for a visit on 07/01/2013.   Specialty:  General Surgery   Contact information:   [address] [place] [address] [phone]       Signed: Aviva Signs A 06/27/2013, 10:48 AM

## 2013-07-02 ENCOUNTER — Inpatient Hospital Stay (HOSPITAL_COMMUNITY): Admission: RE | Admit: 2013-07-02 | Payer: Medicare Other | Source: Ambulatory Visit

## 2013-07-03 ENCOUNTER — Encounter (HOSPITAL_COMMUNITY): Payer: Self-pay

## 2013-07-15 ENCOUNTER — Encounter (HOSPITAL_COMMUNITY): Payer: Self-pay

## 2013-07-21 ENCOUNTER — Encounter (HOSPITAL_COMMUNITY): Payer: Self-pay

## 2013-07-21 ENCOUNTER — Encounter (HOSPITAL_COMMUNITY): Payer: Medicare Other | Attending: Hematology and Oncology

## 2013-07-21 VITALS — BP 106/71 | HR 105 | Temp 98.7°F | Resp 16 | Ht 63.75 in | Wt 169.0 lb

## 2013-07-21 DIAGNOSIS — I10 Essential (primary) hypertension: Secondary | ICD-10-CM

## 2013-07-21 DIAGNOSIS — E669 Obesity, unspecified: Secondary | ICD-10-CM | POA: Insufficient documentation

## 2013-07-21 DIAGNOSIS — Z9049 Acquired absence of other specified parts of digestive tract: Secondary | ICD-10-CM | POA: Insufficient documentation

## 2013-07-21 DIAGNOSIS — Z09 Encounter for follow-up examination after completed treatment for conditions other than malignant neoplasm: Secondary | ICD-10-CM | POA: Insufficient documentation

## 2013-07-21 DIAGNOSIS — G43909 Migraine, unspecified, not intractable, without status migrainosus: Secondary | ICD-10-CM | POA: Insufficient documentation

## 2013-07-21 DIAGNOSIS — C187 Malignant neoplasm of sigmoid colon: Secondary | ICD-10-CM

## 2013-07-21 DIAGNOSIS — Z800 Family history of malignant neoplasm of digestive organs: Secondary | ICD-10-CM | POA: Insufficient documentation

## 2013-07-21 DIAGNOSIS — E7800 Pure hypercholesterolemia, unspecified: Secondary | ICD-10-CM | POA: Insufficient documentation

## 2013-07-21 DIAGNOSIS — F17200 Nicotine dependence, unspecified, uncomplicated: Secondary | ICD-10-CM | POA: Insufficient documentation

## 2013-07-21 DIAGNOSIS — C189 Malignant neoplasm of colon, unspecified: Secondary | ICD-10-CM

## 2013-07-21 DIAGNOSIS — G4733 Obstructive sleep apnea (adult) (pediatric): Secondary | ICD-10-CM | POA: Insufficient documentation

## 2013-07-21 DIAGNOSIS — F319 Bipolar disorder, unspecified: Secondary | ICD-10-CM

## 2013-07-21 DIAGNOSIS — Z85038 Personal history of other malignant neoplasm of large intestine: Secondary | ICD-10-CM | POA: Insufficient documentation

## 2013-07-21 LAB — COMPREHENSIVE METABOLIC PANEL
ALK PHOS: 105 U/L (ref 39–117)
ALT: 7 U/L (ref 0–35)
AST: 11 U/L (ref 0–37)
Albumin: 4.3 g/dL (ref 3.5–5.2)
BUN: 10 mg/dL (ref 6–23)
CO2: 27 mEq/L (ref 19–32)
Calcium: 10.1 mg/dL (ref 8.4–10.5)
Chloride: 101 mEq/L (ref 96–112)
Creatinine, Ser: 0.68 mg/dL (ref 0.50–1.10)
GFR calc non Af Amer: >90 mL/min (ref >90)
GLUCOSE: 88 mg/dL (ref 70–99)
POTASSIUM: 4 meq/L (ref 3.7–5.3)
Sodium: 141 mEq/L (ref 137–147)
Total Bilirubin: 0.4 mg/dL (ref 0.3–1.2)
Total Protein: 8.3 g/dL (ref 6.0–8.3)

## 2013-07-21 LAB — CBC WITH DIFFERENTIAL/PLATELET
Basophils Absolute: 0 10*3/uL (ref 0.0–0.1)
Basophils Relative: 0 % (ref 0–1)
Eosinophils Absolute: 0.1 10*3/uL (ref 0.0–0.7)
Eosinophils Relative: 2 % (ref 0–5)
HCT: 36.3 % (ref 36.0–46.0)
Hemoglobin: 12.6 g/dL (ref 12.0–15.0)
Lymphocytes Relative: 24 % (ref 12–46)
Lymphs Abs: 1.5 10*3/uL (ref 0.7–4.0)
MCH: 30.1 pg (ref 26.0–34.0)
MCHC: 34.7 g/dL (ref 30.0–36.0)
MCV: 86.6 fL (ref 78.0–100.0)
Monocytes Absolute: 0.4 10*3/uL (ref 0.1–1.0)
Monocytes Relative: 6 % (ref 3–12)
NEUTROS PCT: 68 % (ref 43–77)
Neutro Abs: 4.1 10*3/uL (ref 1.7–7.7)
PLATELETS: 256 10*3/uL (ref 150–400)
RBC: 4.19 MIL/uL (ref 3.87–5.11)
RDW: 15.4 % (ref 11.5–15.5)
WBC: 6 10*3/uL (ref 4.0–10.5)

## 2013-07-21 NOTE — Progress Notes (Signed)
[place] [facility]. [facility] [place], M.D.  NEW PATIENT EVALUATION   Name: Ashley Patrick Date: 07/21/2013 MRN: [id number] DOB: 1956-03-16  PCP: Elyn Peers, MD   REFERRING PHYSICIAN: Lucianne Lei, MD  REASON FOR REFERRAL: Resected sigmoid colon cancer     HISTORY OF PRESENT ILLNESS:Ashley Patrick is a 58 y.o. 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 The patient experiences some midline cramping abdominal discomfort since her surgery but no nausea, vomiting, diarrhea, constipation, melena, or further hematochezia. She denies any fever, night sweats, abdominal distention, lower extremity swelling or redness, PND, orthopnea, palpitations, skin rash, joint pain, cough, shortness of breath, headache, or seizures. She had originally presented with rectal bleeding and underwent colonoscopy at which time adenomatous polyps were found in addition to an invasive malignancy in the sigmoid colon.   PAST MEDICAL HISTORY:  has a past medical history of Back pain; Obesity; Nicotine addiction; Psychosis; Hypertension; OD (overdose of drug); Bipolar 1 disorder; Hypercholesteremia; Migraine headache; and Lateral epicondylitis  of elbow.     PAST SURGICAL HISTORY: Past Surgical History  Procedure Laterality Date  . Partial hysterectomy    . Breast biopsy  10/18/2011    Procedure: BREAST BIOPSY;  Surgeon: Jamesetta So, MD;  Location: [facility];  Service: General;  Laterality: Left;  . Esophagogastroduodenoscopy N/A 12/26/2012    DJT:TSVXBLT reflux esophagitis. Gastric and duodenal bulbar erosions-status post gastric biopsynegative H.pylori  . Abdominal hysterectomy    . Colonoscopy    . Colonoscopy N/A 05/29/2013    Procedure: COLONOSCOPY;  Surgeon: Daneil Dolin, MD;  Location: [facility];  Service: Endoscopy;  Laterality: N/A;  11:30  . Colon resection N/A 06/23/2013    Procedure:  HAND ASSISTED LAPAROSCOPIC PARTIAL COLECTOMY;  Surgeon: Jamesetta So, MD;  Location: [facility];  Service: General;  Laterality: N/A;     CURRENT MEDICATIONS: has a current medication list which includes the following prescription(s): amlodipine-benazepril, ibuprofen, polyethylene glycol, dicyclomine, hydrochlorothiazide, and oxycodone-acetaminophen.   ALLERGIES: Review of patient's allergies indicates no known allergies.   SOCIAL HISTORY:  reports that she has been smoking Cigarettes.  She has a 19 pack-year smoking history. She has never used smokeless tobacco. She reports that she does not drink alcohol or use illicit drugs.   FAMILY HISTORY: family history includes Colon cancer in her father. There is no history of Anesthesia problems, Hypotension, Malignant hyperthermia, or Pseudochol deficiency.    REVIEW OF SYSTEMS:  Other than that discussed above is noncontributory.    PHYSICAL EXAM:  height is 5' 3.75" (1.619 m) and weight is 169 lb (76.658 kg). Her oral temperature is 98.7 F (37.1 C). Her blood pressure is 106/71 and her pulse is 105. Her respiration is 16.    GENERAL:alert, no distress and comfortable SKIN: skin color, texture, turgor are normal, no rashes or significant lesions EYES: normal, Conjunctiva are pink and non-injected, sclera clear OROPHARYNX:no exudate, no erythema and lips, buccal mucosa, and tongue normal  NECK: supple, thyroid normal size, non-tender, without nodularity CHEST: Normal AP diameter with no breast masses. LYMPH:  no palpable lymphadenopathy in the cervical, axillary or inguinal LUNGS: clear to auscultation and percussion with normal breathing effort HEART: regular rate & rhythm and no murmurs ABDOMEN:abdomen soft, non-tender and normal bowel sounds. Surgical wound well healed with no liver or spleen enlargement. No free fluid wave or shifting dullness. MUSCULOSKELETALl:no cyanosis  of digits, no clubbing or edema  NEURO: alert & oriented x 3  with fluent speech, no focal motor/sensory deficits    LABORATORY DATA:  Admission on 06/23/2013, Discharged on 06/27/2013  Component Date Value Range Status  . ABO/RH(D) 06/17/2013 O POS   Final  . Glucose-Capillary 06/23/2013 96  70 - 99 mg/dL Final  . Sodium 06/24/2013 140  137 - 147 mEq/L Final  . Potassium 06/24/2013 3.8  3.7 - 5.3 mEq/L Final  . Chloride 06/24/2013 100  96 - 112 mEq/L Final  . CO2 06/24/2013 28  19 - 32 mEq/L Final  . Glucose, Bld 06/24/2013 107* 70 - 99 mg/dL Final  . BUN 06/24/2013 9  6 - 23 mg/dL Final  . Creatinine, Ser 06/24/2013 0.79  0.50 - 1.10 mg/dL Final  . Calcium 06/24/2013 10.0  8.4 - 10.5 mg/dL Final  . GFR calc non Af Amer 06/24/2013 >90  >90 mL/min Final  . GFR calc Af Amer 06/24/2013 >90  >90 mL/min Final   Comment: (NOTE)                          The eGFR has been calculated using the CKD EPI equation.                          This calculation has not been validated in all clinical situations.                          eGFR's persistently <90 mL/min signify possible Chronic Kidney                          Disease.  . Magnesium 06/24/2013 1.6  1.5 - 2.5 mg/dL Final  . Phosphorus 06/24/2013 5.2* 2.3 - 4.6 mg/dL Final  . WBC 06/24/2013 9.4  4.0 - 10.5 K/uL Final  . RBC 06/24/2013 4.05  3.87 - 5.11 MIL/uL Final  . Hemoglobin 06/24/2013 12.0  12.0 - 15.0 g/dL Final  . HCT 06/24/2013 34.1* 36.0 - 46.0 % Final  . MCV 06/24/2013 84.2  78.0 - 100.0 fL Final  . MCH 06/24/2013 29.6  26.0 - 34.0 pg Final  . MCHC 06/24/2013 35.2  30.0 - 36.0 g/dL Final  . RDW 06/24/2013 14.9  11.5 - 15.5 % Final  . Platelets 06/24/2013 211  150 - 400 K/uL Final  . WBC 06/25/2013 6.5  4.0 - 10.5 K/uL Final  . RBC 06/25/2013 4.19  3.87 - 5.11 MIL/uL Final  . Hemoglobin 06/25/2013 12.5  12.0 - 15.0 g/dL Final  . HCT 06/25/2013 35.8* 36.0 - 46.0 % Final  . MCV 06/25/2013 85.4  78.0 - 100.0 fL Final  . MCH 06/25/2013 29.8  26.0 - 34.0 pg Final  . MCHC 06/25/2013 34.9   30.0 - 36.0 g/dL Final  . RDW 06/25/2013 15.2  11.5 - 15.5 % Final  . Platelets 06/25/2013 218  150 - 400 K/uL Final  . Sodium 06/25/2013 141  137 - 147 mEq/L Final  . Potassium 06/25/2013 3.1* 3.7 - 5.3 mEq/L Final   DELTA CHECK NOTED  . Chloride 06/25/2013 102  96 - 112 mEq/L Final  . CO2 06/25/2013 26  19 - 32 mEq/L Final  . Glucose, Bld 06/25/2013 109* 70 - 99 mg/dL Final  . BUN 06/25/2013 7  6 - 23 mg/dL Final  . Creatinine, Ser 06/25/2013 0.73  0.50 - 1.10 mg/dL Final  . Calcium 06/25/2013 9.8  8.4 - 10.5 mg/dL Final  . GFR calc non Af Amer 06/25/2013 >90  >90 mL/min Final  . GFR calc Af Amer 06/25/2013 >90  >90 mL/min Final   Comment: (NOTE)                          The eGFR has been calculated using the CKD EPI equation.                          This calculation has not been validated in all clinical situations.                          eGFR's persistently <90 mL/min signify possible Chronic Kidney                          Disease.  . Phosphorus 06/25/2013 3.7  2.3 - 4.6 mg/dL Final  . Magnesium 06/25/2013 2.0  1.5 - 2.5 mg/dL Final  . Sodium 06/26/2013 139  137 - 147 mEq/L Final  . Potassium 06/26/2013 3.7  3.7 - 5.3 mEq/L Final  . Chloride 06/26/2013 101  96 - 112 mEq/L Final  . CO2 06/26/2013 25  19 - 32 mEq/L Final  . Glucose, Bld 06/26/2013 99  70 - 99 mg/dL Final  . BUN 06/26/2013 6  6 - 23 mg/dL Final  . Creatinine, Ser 06/26/2013 0.82  0.50 - 1.10 mg/dL Final  . Calcium 06/26/2013 9.8  8.4 - 10.5 mg/dL Final  . GFR calc non Af Amer 06/26/2013 79* >90 mL/min Final  . GFR calc Af Amer 06/26/2013 >90  >90 mL/min Final   Comment: (NOTE)                          The eGFR has been calculated using the CKD EPI equation.                          This calculation has not been validated in all clinical situations.                          eGFR's persistently <90 mL/min signify possible Chronic Kidney                          Disease.  . WBC 06/26/2013 6.5  4.0 - 10.5 K/uL  Final  . RBC 06/26/2013 4.16  3.87 - 5.11 MIL/uL Final  . Hemoglobin 06/26/2013 12.3  12.0 - 15.0 g/dL Final  . HCT 06/26/2013 35.5* 36.0 - 46.0 % Final  . MCV 06/26/2013 85.3  78.0 - 100.0 fL Final  . MCH 06/26/2013 29.6  26.0 - 34.0 pg Final  . MCHC 06/26/2013 34.6  30.0 - 36.0 g/dL Final  . RDW 06/26/2013 15.1  11.5 - 15.5 % Final  . Platelets 06/26/2013 218  150 - 400 K/uL Final    Urinalysis    Component Value Date/Time   COLORURINE YELLOW 12/25/2012 2000   APPEARANCEUR CLEAR 12/25/2012 2000   LABSPEC 1.010 12/25/2012 2000   PHURINE 8.5* 12/25/2012 2000   GLUCOSEU NEGATIVE 12/25/2012 2000   HGBUR MODERATE* 12/25/2012 2000   HGBUR small 04/28/2009 [facility] 12/25/2012 2000  KETONESUR NEGATIVE 12/25/2012 2000   PROTEINUR NEGATIVE 12/25/2012 2000   UROBILINOGEN 0.2 12/25/2012 2000   NITRITE NEGATIVE 12/25/2012 2000   LEUKOCYTESUR NEGATIVE 12/25/2012 2000      _0 :   MM Digital Diagnostic Unilat R Status: Final result            Study Result    *RADIOLOGY REPORT*  Clinical Data: Abnormal right screening mammogram  DIGITAL DIAGNOSTIC RIGHT MAMMOGRAM  Comparison: With priors  Findings:  ACR Breast Density Category 3: The breast tissue is heterogeneously  dense.  There are diffuse round and punctate calcifications in the medial  and central aspect of the right breast as well as a few scattered  coarse calcifications. They are felt to likely be benign. There  is no suspicious mass.  IMPRESSION:  Probable benign calcifications in the right breast.  RECOMMENDATION:  Right diagnostic mammogram in 6 months is recommended.  I have discussed the findings and recommendations with the patient.  Results were also provided in writing at the conclusion of the  visit. If applicable, a reminder letter will be sent to the  patient regarding her next appointment.  BI-RADS CATEGORY 3: Probably benign finding(s) - short interval  follow-up suggested.  Original Report  Authenticated        CT Abdomen Pelvis W Contrast Status: Final result         PACS Images    Show images for CT Abdomen Pelvis W Contrast         Study Result    CLINICAL DATA: Lower abdominal pain 1 day after colonoscopy,  previous history of hematochezia with no bleeding currently  EXAM:  CT ABDOMEN AND PELVIS WITH CONTRAST  TECHNIQUE:  Multidetector CT imaging of the abdomen and pelvis was performed  using the standard protocol following bolus administration of  intravenous contrast.  CONTRAST: 53m OMNIPAQUE IOHEXOL 300 MG/ML SOLN, 1041mOMNIPAQUE  IOHEXOL 300 MG/ML SOLN intravenously. The patient also received oral  contrast material.  COMPARISON: CT scan of the abdomen and pelvis dated December 25, 2012.  FINDINGS:  The orally administered contrast has traversed the stomach and  portions of the small bowel but has not yet reached the colon. There  is no evidence of a small bowel obstruction nor of enteritis. The  colon contains a moderate amount of stool and gas. A normal  calibered, uninflamed-appearing appendix is demonstrated. There is  no mural thickening nor mural gas nor extraluminal gas demonstrated.  There is stool in the rectosigmoid in a normal pattern.  There are hypodensities within the liver which are stable and are  compatible with cysts. The gallbladder is adequately distended and  exhibits a small Phrygian cap which is stable. There are no  calcified stones demonstrated. There is no pericholecystic fluid.  The pancreas exhibits no focal mass. There is minimal dilation of  the pancreatic duct which is a stable finding. The spleen is not  enlarged. There are no adrenal masses. The kidneys exhibit no  evidence of obstruction. There is a stable 1 cm diameter upper pole  hypodensity within the medial aspect of the right kidney most  compatible with a cyst. The caliber of the abdominal aorta is  normal. There are no bulky periaortic nor pericaval lymph  nodes.  Within the pelvis the mildly distended urinary bladder is normal in  appearance. There is no inguinal nor significant umbilical hernia.  The lung bases are clear. The lumbar vertebral bodies are preserved  in height. The bony pelvis  exhibits no acute abnormality.  IMPRESSION:  1. No free extraluminal gas collections are demonstrated. The  appearance of the small and large bowel is within the limits of  normal.  2. There are stable hepatic and right renal cysts. There is stable  dilation of the pancreatic duct without evidence of a pancreatic  mass.  3. There is no intra-abdominal or pelvic lymphadenopathy.  4. There is no evidence of obstruction of either kidney. The urinary  bladder is mildly distended.  Electronically Signed  By: David Martinique  On: 05/30/2013 12:19      PATHOLOGY:  FINAL for Ileene Rubens A [phone]) Patient: JERIS, EASTERLY A Collected: 06/23/2013 Client: [facility] Accession: [id number] Received: 06/23/2013 Aviva Signs DOB: [birth year] Age: 45 Gender: F Reported: 06/24/2013 [address] Patient Ph: [phone] MRN #: [id number] Linna Hoff [place] [id number] Visit #: [id number] Chart #: Phone: [phone] Fax: CC: REPORT OF SURGICAL PATHOLOGY ADDITIONAL INFORMATION: Mismatch Repair (MMR) Protein Immunohistochemistry (IHC) IHC Expression Result: MLH1: Preserved nuclear expression (greater 50% tumor expression) MSH2: Preserved nuclear expression (greater 50% tumor expression) MSH6: Preserved nuclear expression (greater 50% tumor expression) PMS2: Preserved nuclear expression (greater 50% tumor expression) * Internal control demonstrates intact nuclear expression Interpretation: NORMAL There is preserved expression of the major and minor MMR proteins. There is a very low probability that microsatellite instability (MSI) is present. However, certain clinically significant MMR protein mutations may result in preservation of nuclear expression. It is  recommended that the preservation of protein expression be correlated with molecular based MSI testing. References: 1. Guidelines on Genetic Evaluation and Management of Lynch Syndrome: A Consensus Statement by the [place] Multi-Society Task Force on Colorectal Cancer Gae Dry. Sherlie Ban , MD, and others . Am Nicki Guadalajara 2014; [phone]; doi: 10.1038/ajg.2014.186; published online 07 January 2013 2. Outcomes of screening endometrial cancer patients for Lynch syndrome by patient-administered checklist. Olena Heckle MS, and others. Gynecol Oncol 2013;131(3):619-623. Susanne Greenhouse MD Pathologist, Electronic Signature ( Signed 06/25/2013) FINAL DIAGNOSIS Diagnosis Colon, segmental resection for tumor, sigmoid - INVASIVE ADENOCARCINOMA INVADING INTO MUSCULARIS PROPRIA. - TWELVE LYMPH NODES, NEGATIVE FOR METASTATIC CARCINOMA (0/12). - RESECTION MARGINS, NEGATIVE FOR ATYPIA OR MALIGNANCY. 1 of 3 FINAL for Kalish, Elonda A ([id number]) Microscopic Comment COLON Specimen: Sigmoid colon. Procedure: Segmental resection Tumor site: Sigmoid colon at the juncture of mesenteric and antimesenteric. Specimen integrity: Intact. Macroscopic intactness of mesorectum: N/A Macroscopic tumor perforation: No. Invasive tumor: Maximum size: 2.8 cm Histologic type(s): Invasive adenocarcinoma. Histologic grade and differentiation: G2 Type of polyp in which invasive carcinoma arose: N/A Microscopic extension of invasive tumor: Invading into but not through muscularis propria. Lymph-Vascular invasion: Not identified. Peri-neural invasion: Not identified. Tumor deposit(s) (discontinuous extramural extension): No. Resection margins: Negative. Proximal margin: 7.8 cm Distal margin: 3.5 cm Mesenteric margin (sigmoid and transverse): 3.0 cm Trans-anal resection margins only: N/A Treatment effect (neo-adjuvant therapy): No. Additional polyp(s): N/A Non-neoplastic findings: Prominent Crohn's like reaction at the  periphery of the tumor Lymph nodes: number examined- 12; number positive: - 0 Pathologic Staging: pT2, pN0, pMX. Ancillary studies: MMR immunostains and MSI testing will be performed and an addendum report will follow. (HCL:gt, 06/24/13) Aldona Bar MD Pathologist, Electronic Signature (Case signed 06/24/2013) Specimen Gross and Clinical Information Specimen(s) Obtained: Colon, segmental resection for tumor, sigmoid Specimen Clinical Information sigmoid colon cancer (kp) Gross Specimen: Received in formalin labeled sigmoid colon (tumor distal). Specimen integrity: An intact, previously opened specimen with two stapled resection margins. Specimen length: 13.2 cm. Mesorectal intactness: Not applicable. Tumor location: At the  juncture of mesenteric and antimesenteric aspect. Tumor size: There is a 2.8 x 2.0 x 0.9 cm tan-pink, exophytic lesion with a central suppression. Percent of bowel circumference involved: Approximately 40%. Tumor distance to margins: Proximal: 7.8 cm. Distal: 3.5 cm. Mesenteric (sigmoid and transverse): 3.0 cm. Macroscopic extent of tumor invasion: Tumor invades submucosa. 2 of 3 FINAL for Kilbride, Leisel A ([id number]) Gross(continued) Total presumed lymph nodes: Ten possible lymph nodes are identified, ranging from 0.2 to 1.5 cm in greatest dimension. The remaining fat is placed in clearance solution for possible additional lymph node identification. Following clearing, no additional lymph nodes are identified. Extramural satellite tumor nodules: None identified. Mucosal polyp(s): There are multiple tan-white, sessile polyps throughout the specimen, ranging from 0.2 to 0.4 cm in greatest dimension. Additional findings: The uninvolved mucosa is tan-pink with normal folding. There is an area 4.0 cm in greatest dimension proximal to the lesion, and a 3.3 cm in greatest dimension area distal to the lesion, which displays blue-gray discoloration. Block summary: A =  proximal resection margin. B = distal resection margin. C, D = representative section of small sessile polyp. E - H = lesion. I - mucosal discoloration, proximal and distal to lesion. J = four possible lymph nodes. K = four possible lymph nodes. L = one possible lymph node, bisected. M = one possible lymph node, bisected. (KL:ecj 06/23/2013) Stain(s) used in Diagnosis: The following stain(s) were used in diagnosing the case: MSH6, MLH1, MSH2, PMS2. The control(s) stained appropriately. Disclaimer Some of these immunohistochemical stains may have been developed and the performance characteristics determined by [facility]. Some may not have been cleared or approved by the U.S. Food and Drug Administration. The FDA has determined that such clearance or approval is not necessary. This test is used for clinical purposes. It should not be regarded as investigational or for research. This laboratory is certified under the [place] (CLIA-88) as qualified to perform high complexity clinical laboratory testing. Report signed out from the following location(s) Technical Component and Interpretation performed at [facility], [place], [place] [address]. CLIA   FINAL for Conklin, Ozelle A ([id number]) Patient: GRENDA, LORA A Collected: 06/23/2013 Client: [facility] Accession: [id number] Received: 06/23/2013 Aviva Signs DOB: 25-Oct-1955 Age: 64 Gender: F Reported: 06/24/2013 [address] Patient Ph: [phone] MRN #: [id number] Linna Hoff [place] [id number] Visit #: [id number] Chart #: Phone: [phone] Fax: CC: REPORT OF SURGICAL PATHOLOGY ADDITIONAL INFORMATION: Mismatch Repair (MMR) Protein Immunohistochemistry (IHC) IHC Expression Result: MLH1: Preserved nuclear expression (greater 50% tumor expression) MSH2: Preserved nuclear expression (greater 50% tumor expression) MSH6: Preserved nuclear expression (greater  50% tumor expression) PMS2: Preserved nuclear expression (greater 50% tumor expression) * Internal control demonstrates intact nuclear expression Interpretation: NORMAL There is preserved expression of the major and minor MMR proteins. There is a very low probability that microsatellite instability (MSI) is present. However, certain clinically significant MMR protein mutations may result in preservation of nuclear expression. It is recommended that the preservation of protein expression be correlated with molecular based MSI testing. References: 1. Guidelines on Genetic Evaluation and Management of Lynch Syndrome: A Consensus Statement by the [place] Multi-Society Task Force on Colorectal Cancer Gae Dry. Sherlie Ban , MD, and others . Am Nicki Guadal[place]ara 2014; [phone]; doi: 10.1038/ajg.2014.186; published online 07 January 2013 2. Outcomes of screening endometrial cancer patients for Lynch syndrome by patient-administered checklist. Olena Heckle MS, and others. Gynecol Oncol 2013;131(3):619-623. Susanne Greenhouse MD Pathologist, Electronic Signature ( Signed  06/25/2013) FINAL DIAGNOSIS Diagnosis Colon, segmental resection for tumor, sigmoid - INVASIVE ADENOCARCINOMA INVADING INTO MUSCULARIS PROPRIA. - TWELVE LYMPH NODES, NEGATIVE FOR METASTATIC CARCINOMA (0/12). - RESECTION MARGINS, NEGATIVE FOR ATYPIA OR MALIGNANCY. 1 of 3 FINAL for Barris, Shinika A ([id number]) Microscopic Comment COLON Specimen: Sigmoid colon. Procedure: Segmental resection Tumor site: Sigmoid colon at the juncture of mesenteric and antimesenteric. Specimen integrity: Intact. Macroscopic intactness of mesorectum: N/A Macroscopic tumor perforation: No. Invasive tumor: Maximum size: 2.8 cm Histologic type(s): Invasive adenocarcinoma. Histologic grade and differentiation: G2 Type of polyp in which invasive carcinoma arose: N/A Microscopic extension of invasive tumor: Invading into but not through muscularis  propria. Lymph-Vascular invasion: Not identified. Peri-neural invasion: Not identified. Tumor deposit(s) (discontinuous extramural extension): No. Resection margins: Negative. Proximal margin: 7.8 cm Distal margin: 3.5 cm Mesenteric margin (sigmoid and transverse): 3.0 cm Trans-anal resection margins only: N/A Treatment effect (neo-adjuvant therapy): No. Additional polyp(s): N/A Non-neoplastic findings: Prominent Crohn's like reaction at the periphery of the tumor Lymph nodes: number examined- 12; number positive: - 0 Pathologic Staging: pT2, pN0, pMX. Ancillary studies: MMR immunostains and MSI testing will be performed and an addendum report will follow. (HCL:gt, 06/24/13) Aldona Bar MD Pathologist, Electronic Signature (Case signed 06/24/2013) Specimen Gross and Clinical Information Specimen(s) Obtained: Colon, segmental resection for tumor, sigmoid Specimen Clinical Information sigmoid colon cancer (kp) Gross Specimen: Received in formalin labeled sigmoid colon (tumor distal). Specimen integrity: An intact, previously opened specimen with two stapled resection margins. Specimen length: 13.2 cm. Mesorectal intactness: Not applicable. Tumor location: At the juncture of mesenteric and antimesenteric aspect. Tumor size: There is a 2.8 x 2.0 x 0.9 cm tan-pink, exophytic lesion with a central suppression. Percent of bowel circumference involved: Approximately 40%. Tumor distance to margins: Proximal: 7.8 cm. Distal: 3.5 cm. Mesenteric (sigmoid and transverse): 3.0 cm. Macroscopic extent of tumor invasion: Tumor invades submucosa. 2 of 3 FINAL for Villanueva, Matisha A ([id number]) Gross(continued) Total presumed lymph nodes: Ten possible lymph nodes are identified, ranging from 0.2 to 1.5 cm in greatest dimension. The remaining fat is placed in clearance solution for possible additional lymph node identification. Following clearing, no additional lymph nodes are  identified. Extramural satellite tumor nodules: None identified. Mucosal polyp(s): There are multiple tan-white, sessile polyps throughout the specimen, ranging from 0.2 to 0.4 cm in greatest dimension. Additional findings: The uninvolved mucosa is tan-pink with normal folding. There is an area 4.0 cm in greatest dimension proximal to the lesion, and a 3.3 cm in greatest dimension area distal to the lesion, which displays blue-gray discoloration. Block summary: A = proximal resection margin. B = distal resection margin. C, D = representative section of small sessile polyp. E - H = lesion. I - mucosal discoloration, proximal and distal to lesion. J = four possible lymph nodes. K = four possible lymph nodes. L = one possible lymph node, bisected. M = one possible lymph node, bisected. (KL:ecj 06/23/2013) Stain(s) used in Diagnosis: The following stain(s) were used in diagnosing the case: MSH6, MLH1, MSH2, PMS2. The control(s) stained appropriately. Disclaimer Some of these immunohistochemical stains may have been developed and the performance characteristics determined by [facility]. Some may not have been cleared or approved by the U.S. Food and Drug Administration. The FDA has determined that such clearance or approval is not necessary. This test is used for clinical purposes. It should not be regarded as investigational or for research. This laboratory is certified under the [place] (CLIA-88) as qualified to  perform high complexity clinical laboratory testing. Report signed out from the following location(s) Technical Component and Interpretation performed at [facility].[place], [place], [place] [address]. CLIA   IMPRESSION:  #1. Stage I (pT N0 M0) moderately differentiated adenocarcinoma sigmoid colon, status post sigmoid colectomy with 12 nodes negative, no evidence of disease. #2. Bipolar disorder, on  treatment. #3. Hypertension, controlled. #4. Obstructive sleep apnea syndrome. #5. Migraine syndrome, stable   PLAN:  #1. No additional adjuvant treatment is indicated for stage IV colon cancer. #2. Yearly mammography. #3. Colonoscopy 1 year and every 3 years if no polyps were found at that time. GI followup is most critical aspect of her future management. #4. No further appointments were made in this office.   I appreciate the opportunity sharing in her care.   Doroteo Bradford, MD 07/21/2013 2:42 PM

## 2013-07-21 NOTE — Patient Instructions (Signed)
[facility] Discharge Instructions  RECOMMENDATIONS MADE BY THE CONSULTANT AND ANY TEST RESULTS WILL BE SENT TO YOUR REFERRING PHYSICIAN.  EXAM FINDINGS BY THE PHYSICIAN TODAY AND SIGNS OR SYMPTOMS TO REPORT TO CLINIC OR PRIMARY PHYSICIAN: Exam and findings as discussed by Dr. Barnet Glasgow.  You have stage I colon cancer.  We will check some blood work today and if there are any concerns we will call you.  You need to keep you follow-up with your GI physician.  Report changes in you bowel habits, blood in your bowel movement, unexplained weight loss or other problems.  MEDICATIONS PRESCRIBED:  none  INSTRUCTIONS/FOLLOW-UP: No follow-up needed.  Thank you for choosing [facility] to provide your oncology and hematology care.  To afford each patient quality time with our providers, please arrive at least 15 minutes before your scheduled appointment time.  With your help, our goal is to use those 15 minutes to complete the necessary work-up to ensure our physicians have the information they need to help with your evaluation and healthcare recommendations.    Effective January 1st, 2014, we ask that you re-schedule your appointment with our physicians should you arrive 10 or more minutes late for your appointment.  We strive to give you quality time with our providers, and arriving late affects you and other patients whose appointments are after yours.    Again, thank you for choosing [facility].  Our hope is that these requests will decrease the amount of time that you wait before being seen by our physicians.       _____________________________________________________________  Should you have questions after your visit to [facility], please contact our office at [phone] between the hours of 8:30 a.m. and 5:00 p.m.  Voicemails left after 4:30 p.m. will not be returned until the following business day.  For prescription refill  requests, have your pharmacy contact our office with your prescription refill request.

## 2013-07-21 NOTE — Progress Notes (Signed)
Ashley Patrick presented for labwork. Labs per MD order drawn via Peripheral Line 23 gauge needle inserted in right AC  Good blood return present. Procedure without incident.  Needle removed intact. Patient tolerated procedure well.

## 2013-07-22 LAB — CEA: CEA: 1.4 ng/mL (ref 0.0–5.0)

## 2013-07-29 ENCOUNTER — Emergency Department (HOSPITAL_COMMUNITY)
Admission: EM | Admit: 2013-07-29 | Discharge: 2013-07-29 | Disposition: A | Discharge status: Home / Self Care | Payer: Medicare Other | Attending: Emergency Medicine | Admitting: Emergency Medicine

## 2013-07-29 ENCOUNTER — Encounter (HOSPITAL_COMMUNITY): Payer: Self-pay | Admitting: Emergency Medicine

## 2013-07-29 ENCOUNTER — Emergency Department (HOSPITAL_COMMUNITY): Payer: Medicare Other

## 2013-07-29 DIAGNOSIS — G43909 Migraine, unspecified, not intractable, without status migrainosus: Secondary | ICD-10-CM | POA: Insufficient documentation

## 2013-07-29 DIAGNOSIS — Z8659 Personal history of other mental and behavioral disorders: Secondary | ICD-10-CM | POA: Insufficient documentation

## 2013-07-29 DIAGNOSIS — R1084 Generalized abdominal pain: Secondary | ICD-10-CM | POA: Insufficient documentation

## 2013-07-29 DIAGNOSIS — I10 Essential (primary) hypertension: Secondary | ICD-10-CM | POA: Insufficient documentation

## 2013-07-29 DIAGNOSIS — Z79899 Other long term (current) drug therapy: Secondary | ICD-10-CM | POA: Insufficient documentation

## 2013-07-29 DIAGNOSIS — F17200 Nicotine dependence, unspecified, uncomplicated: Secondary | ICD-10-CM | POA: Insufficient documentation

## 2013-07-29 DIAGNOSIS — Z9889 Other specified postprocedural states: Secondary | ICD-10-CM | POA: Insufficient documentation

## 2013-07-29 DIAGNOSIS — Z8739 Personal history of other diseases of the musculoskeletal system and connective tissue: Secondary | ICD-10-CM | POA: Insufficient documentation

## 2013-07-29 DIAGNOSIS — E7800 Pure hypercholesterolemia, unspecified: Secondary | ICD-10-CM | POA: Insufficient documentation

## 2013-07-29 DIAGNOSIS — R109 Unspecified abdominal pain: Secondary | ICD-10-CM

## 2013-07-29 DIAGNOSIS — Z90710 Acquired absence of both cervix and uterus: Secondary | ICD-10-CM | POA: Insufficient documentation

## 2013-07-29 LAB — COMPREHENSIVE METABOLIC PANEL
ALBUMIN: 4 g/dL (ref 3.5–5.2)
ALK PHOS: 105 U/L (ref 39–117)
ALT: 9 U/L (ref 0–35)
AST: 13 U/L (ref 0–37)
BILIRUBIN TOTAL: 0.2 mg/dL — AB (ref 0.3–1.2)
BUN: 15 mg/dL (ref 6–23)
CO2: 24 mEq/L (ref 19–32)
Calcium: 9.8 mg/dL (ref 8.4–10.5)
Chloride: 104 mEq/L (ref 96–112)
Creatinine, Ser: 0.72 mg/dL (ref 0.50–1.10)
GFR calc Af Amer: >90 mL/min (ref >90)
GFR calc non Af Amer: >90 mL/min (ref >90)
Glucose, Bld: 84 mg/dL (ref 70–99)
POTASSIUM: 3.4 meq/L — AB (ref 3.7–5.3)
Sodium: 143 mEq/L (ref 137–147)
TOTAL PROTEIN: 7.6 g/dL (ref 6.0–8.3)

## 2013-07-29 LAB — LIPASE, BLOOD: LIPASE: 47 U/L (ref 11–59)

## 2013-07-29 LAB — URINALYSIS, ROUTINE W REFLEX MICROSCOPIC
BILIRUBIN URINE: NEGATIVE
Glucose, UA: NEGATIVE mg/dL
Hgb urine dipstick: NEGATIVE
Ketones, ur: NEGATIVE mg/dL
Nitrite: NEGATIVE
PH: 8.5 — AB (ref 5.0–8.0)
Protein, ur: NEGATIVE mg/dL
SPECIFIC GRAVITY, URINE: 1.015 (ref 1.005–1.030)
UROBILINOGEN UA: 0.2 mg/dL (ref 0.0–1.0)

## 2013-07-29 LAB — CBC WITH DIFFERENTIAL/PLATELET
BASOS PCT: 0 % (ref 0–1)
Basophils Absolute: 0 10*3/uL (ref 0.0–0.1)
Eosinophils Absolute: 0.2 10*3/uL (ref 0.0–0.7)
Eosinophils Relative: 4 % (ref 0–5)
HCT: 35.1 % — ABNORMAL LOW (ref 36.0–46.0)
HEMOGLOBIN: 12.2 g/dL (ref 12.0–15.0)
Lymphocytes Relative: 53 % — ABNORMAL HIGH (ref 12–46)
Lymphs Abs: 3.2 10*3/uL (ref 0.7–4.0)
MCH: 30 pg (ref 26.0–34.0)
MCHC: 34.8 g/dL (ref 30.0–36.0)
MCV: 86.5 fL (ref 78.0–100.0)
MONOS PCT: 6 % (ref 3–12)
Monocytes Absolute: 0.4 10*3/uL (ref 0.1–1.0)
NEUTROS ABS: 2.2 10*3/uL (ref 1.7–7.7)
Neutrophils Relative %: 37 % — ABNORMAL LOW (ref 43–77)
PLATELETS: 247 10*3/uL (ref 150–400)
RBC: 4.06 MIL/uL (ref 3.87–5.11)
RDW: 14.7 % (ref 11.5–15.5)
WBC: 6 10*3/uL (ref 4.0–10.5)

## 2013-07-29 LAB — URINE MICROSCOPIC-ADD ON

## 2013-07-29 LAB — TROPONIN I: Troponin I: <0.3 ng/mL (ref <0.30)

## 2013-07-29 MED ORDER — IOHEXOL 300 MG/ML  SOLN
100.0000 mL | Freq: Once | INTRAMUSCULAR | Status: AC | PRN
  Administered 2013-07-29: 100 mL via INTRAVENOUS

## 2013-07-29 MED ORDER — DICYCLOMINE HCL 20 MG PO TABS: 20.0000 mg | ORAL_TABLET | Freq: Two times a day (BID) | ORAL | Status: DC | PRN

## 2013-07-29 MED ORDER — ONDANSETRON HCL 4 MG/2ML IJ SOLN
4.0000 mg | Freq: Once | INTRAMUSCULAR | Status: AC
  Administered 2013-07-29: 4 mg via INTRAVENOUS
  Filled 2013-07-29: qty 2

## 2013-07-29 MED ORDER — SODIUM CHLORIDE 0.9 % IV BOLUS (SEPSIS)
1000.0000 mL | Freq: Once | INTRAVENOUS | Status: AC
  Administered 2013-07-29: 1000 mL via INTRAVENOUS

## 2013-07-29 MED ORDER — IOHEXOL 300 MG/ML  SOLN
50.0000 mL | Freq: Once | INTRAMUSCULAR | Status: AC | PRN
  Administered 2013-07-29: 50 mL via ORAL

## 2013-07-29 MED ORDER — MORPHINE SULFATE 4 MG/ML IJ SOLN
4.0000 mg | Freq: Once | INTRAMUSCULAR | Status: AC
  Administered 2013-07-29: 4 mg via INTRAVENOUS
  Filled 2013-07-29: qty 1

## 2013-07-29 NOTE — ED Provider Notes (Signed)
CSN: [id number]     Arrival date & time 07/29/13  0032 History   First MD Initiated Contact with Patient 07/29/13 0058     Chief Complaint  Patient presents with  . Abdominal Pain     (Consider location/radiation/quality/duration/timing/severity/associated sxs/prior Treatment) HPI Patient states she's had been having abdominal pain for "some time". She was seen 2 months ago in emergency department for pain after a colonoscopy with polyp biopsy. She states that her pain has worsened over the last 2 hours prior to coming into the emergency department. She's had no further procedures done. She's had no nausea or vomiting. She's had no constipation or diarrhea. She denies any blood in her stool. Denies any urinary symptoms. Patient has previously had a hysterectomy. Past Medical History  Diagnosis Date  . Back pain   . Obesity     History of  . Nicotine addiction   . Psychosis   . Hypertension   . OD (overdose of drug)     hospitalized for 11 days   . Bipolar 1 disorder   . Hypercholesteremia   . Migraine headache   . Lateral epicondylitis  of elbow     right   Past Surgical History  Procedure Laterality Date  . Partial hysterectomy    . Breast biopsy  10/18/2011    Procedure: BREAST BIOPSY;  Surgeon: Jamesetta So, MD;  Location: [facility];  Service: General;  Laterality: Left;  . Esophagogastroduodenoscopy N/A 12/26/2012    [id number] reflux esophagitis. Gastric and duodenal bulbar erosions-status post gastric biopsynegative H.pylori  . Abdominal hysterectomy    . Colonoscopy    . Colonoscopy N/A 05/29/2013    Procedure: COLONOSCOPY;  Surgeon: Daneil Dolin, MD;  Location: [facility];  Service: Endoscopy;  Laterality: N/A;  11:30  . Colon resection N/A 06/23/2013    Procedure: HAND ASSISTED LAPAROSCOPIC PARTIAL COLECTOMY;  Surgeon: Jamesetta So, MD;  Location: [facility];  Service: General;  Laterality: N/A;   Family History  Problem Relation Age of Onset  . Anesthesia problems  Neg Hx   . Hypotension Neg Hx   . Malignant hyperthermia Neg Hx   . Pseudochol deficiency Neg Hx   . Colon cancer Father     diagnosed in his 2s   History  Substance Use Topics  . Smoking status: Current Every Day Smoker -- 0.50 packs/day for 38 years    Types: Cigarettes  . Smokeless tobacco: Never Used  . Alcohol Use: No   OB History   Grav Para Term Preterm Abortions TAB SAB Ect Mult Living                 Review of Systems  Constitutional: Negative for fever and chills.  Respiratory: Negative for shortness of breath.   Cardiovascular: Negative for chest pain.  Gastrointestinal: Positive for abdominal pain. Negative for nausea, vomiting, diarrhea, constipation and blood in stool.  Genitourinary: Negative for dysuria, flank pain and pelvic pain.  Musculoskeletal: Negative for back pain, myalgias, neck pain and neck stiffness.  Skin: Negative for rash and wound.  Neurological: Negative for dizziness, weakness, numbness and headaches.  All other systems reviewed and are negative.      Allergies  Review of patient's allergies indicates no known allergies.  Home Medications   Current Outpatient Rx  Name  Route  Sig  Dispense  Refill  . amLODipine-benazepril (LOTREL) 5-10 MG per capsule   Oral   Take 1 capsule by mouth daily.         Marland Kitchen  hydrochlorothiazide (MICROZIDE) 12.5 MG capsule   Oral   Take 12.5 mg by mouth daily.         Marland Kitchen ibuprofen (ADVIL,MOTRIN) 200 MG tablet   Oral   Take 200 mg by mouth every 8 (eight) hours as needed. Taking 2 tablets         . polyethylene glycol (MIRALAX / GLYCOLAX) packet   Oral   Take 17 g by mouth daily.   14 each   0   . dicyclomine (BENTYL) 20 MG tablet   Oral   Take 1 tablet (20 mg total) by mouth 3 (three) times daily as needed for spasms.   10 tablet   0   . oxyCODONE-acetaminophen (PERCOCET/ROXICET) 5-325 MG per tablet   Oral   Take 1-2 tablets by mouth every 6 (six) hours as needed for severe pain.   50  tablet   0    BP 121/83  Pulse 94  Temp(Src) 97.8 F (36.6 C) (Oral)  Resp 20  Ht 5\' 6"  (1.676 m)  Wt 160 lb (72.576 kg)  BMI 25.84 kg/m2  SpO2 100% Physical Exam  Nursing note and vitals reviewed. Constitutional: She is oriented to person, place, and time. She appears well-developed and well-nourished. No distress.  HENT:  Head: Normocephalic and atraumatic.  Mouth/Throat: Oropharynx is clear and moist.  Eyes: EOM are normal. Pupils are equal, round, and reactive to light.  Neck: Normal range of motion. Neck supple.  Cardiovascular: Normal rate and regular rhythm.   Pulmonary/Chest: Effort normal and breath sounds normal. No respiratory distress. She has no wheezes. She has no rales.  Abdominal: Soft. Bowel sounds are normal. She exhibits no distension and no mass. There is tenderness (diffuse tenderness to palpation without focality.). There is no rebound and no guarding.  Musculoskeletal: Normal range of motion. She exhibits no edema and no tenderness.  No CVA tenderness bilaterally.  Neurological: She is alert and oriented to person, place, and time.  Skin: Skin is warm and dry. No rash noted. No erythema.  Psychiatric: She has a normal mood and affect. Her behavior is normal.    ED Course  Procedures (including critical care time) Labs Review Labs Reviewed  CBC WITH DIFFERENTIAL - Abnormal; Notable for the following:    HCT 35.1 (*)    Neutrophils Relative % 37 (*)    Lymphocytes Relative 53 (*)    All other components within normal limits  COMPREHENSIVE METABOLIC PANEL - Abnormal; Notable for the following:    Potassium 3.4 (*)    Total Bilirubin 0.2 (*)    All other components within normal limits  URINALYSIS, ROUTINE W REFLEX MICROSCOPIC - Abnormal; Notable for the following:    APPearance HAZY (*)    pH 8.5 (*)    Leukocytes, UA SMALL (*)    All other components within normal limits  URINE MICROSCOPIC-ADD ON - Abnormal; Notable for the following:    Squamous  Epithelial / LPF MANY (*)    Bacteria, UA MANY (*)    All other components within normal limits  LIPASE, BLOOD  TROPONIN I   Imaging Review No results found.  EKG Interpretation   None       MDM   Final diagnoses:  None   Patient is resting comfortably. Her abdomen is soft and benign. CT without acute findings. She does have some increase in stool volume in the ascending colon. I advised her to take her MiraLax as previously prescribed. She is a device to  followup with her primary Dr. for possible GI referral should her symptoms continue. Return precautions have been given patient voiced understanding     Julianne Rice, MD 07/29/13 [phone]

## 2013-07-29 NOTE — ED Notes (Signed)
Pt reporting no improvement in pain or nausea.  Pt is drinking CT prep rather quickly. Encouraged pt to slow pace of drinking slightly to reduce instance of nausea.

## 2013-07-29 NOTE — ED Notes (Signed)
Pt c/o abd pain x 2 hours.

## 2013-07-29 NOTE — ED Notes (Signed)
Pt reporting decrease in nausea and pain level at this time.

## 2013-07-29 NOTE — Discharge Instructions (Signed)
Take medication as prescribed. Followup with your primary Dr. to assure resolution of your symptoms. Return immediately to the emergency department for worsening symptoms, persistent vomiting, fever or for any concerns.  Abdominal Pain, Women Abdominal (stomach, pelvic, or belly) pain can be caused by many things. It is important to tell your doctor:  The location of the pain.  Does it come and go or is it present all the time?  Are there things that start the pain (eating certain foods, exercise)?  Are there other symptoms associated with the pain (fever, nausea, vomiting, diarrhea)? All of this is helpful to know when trying to find the cause of the pain. CAUSES   Stomach: virus or bacteria infection, or ulcer.  Intestine: appendicitis (inflamed appendix), regional ileitis (Crohn's disease), ulcerative colitis (inflamed colon), irritable bowel syndrome, diverticulitis (inflamed diverticulum of the colon), or cancer of the stomach or intestine.  Gallbladder disease or stones in the gallbladder.  Kidney disease, kidney stones, or infection.  Pancreas infection or cancer.  Fibromyalgia (pain disorder).  Diseases of the female organs:  Uterus: fibroid (non-cancerous) tumors or infection.  Fallopian tubes: infection or tubal pregnancy.  Ovary: cysts or tumors.  Pelvic adhesions (scar tissue).  Endometriosis (uterus lining tissue growing in the pelvis and on the pelvic organs).  Pelvic congestion syndrome (female organs filling up with blood just before the menstrual period).  Pain with the menstrual period.  Pain with ovulation (producing an egg).  Pain with an IUD (intrauterine device, birth control) in the uterus.  Cancer of the female organs.  Functional pain (pain not caused by a disease, may improve without treatment).  Psychological pain.  Depression. DIAGNOSIS  Your doctor will decide the seriousness of your pain by doing an examination.  Blood  tests.  X-rays.  Ultrasound.  CT scan (computed tomography, special type of X-ray).  MRI (magnetic resonance imaging).  Cultures, for infection.  Barium enema (dye inserted in the large intestine, to better view it with X-rays).  Colonoscopy (looking in intestine with a lighted tube).  Laparoscopy (minor surgery, looking in abdomen with a lighted tube).  Major abdominal exploratory surgery (looking in abdomen with a large incision). TREATMENT  The treatment will depend on the cause of the pain.   Many cases can be observed and treated at home.  Over-the-counter medicines recommended by your caregiver.  Prescription medicine.  Antibiotics, for infection.  Birth control pills, for painful periods or for ovulation pain.  Hormone treatment, for endometriosis.  Nerve blocking injections.  Physical therapy.  Antidepressants.  Counseling with a psychologist or psychiatrist.  Minor or major surgery. HOME CARE INSTRUCTIONS   Do not take laxatives, unless directed by your caregiver.  Take over-the-counter pain medicine only if ordered by your caregiver. Do not take aspirin because it can cause an upset stomach or bleeding.  Try a clear liquid diet (broth or water) as ordered by your caregiver. Slowly move to a bland diet, as tolerated, if the pain is related to the stomach or intestine.  Have a thermometer and take your temperature several times a day, and record it.  Bed rest and sleep, if it helps the pain.  Avoid sexual intercourse, if it causes pain.  Avoid stressful situations.  Keep your follow-up appointments and tests, as your caregiver orders.  If the pain does not go away with medicine or surgery, you may try:  Acupuncture.  Relaxation exercises (yoga, meditation).  Group therapy.  Counseling. SEEK MEDICAL CARE IF:   You notice  certain foods cause stomach pain.  Your home care treatment is not helping your pain.  You need stronger pain  medicine.  You want your IUD removed.  You feel faint or lightheaded.  You develop nausea and vomiting.  You develop a rash.  You are having side effects or an allergy to your medicine. SEEK IMMEDIATE MEDICAL CARE IF:   Your pain does not go away or gets worse.  You have a fever.  Your pain is felt only in portions of the abdomen. The right side could possibly be appendicitis. The left lower portion of the abdomen could be colitis or diverticulitis.  You are passing blood in your stools (bright red or black tarry stools, with or without vomiting).  You have blood in your urine.  You develop chills, with or without a fever.  You pass out. MAKE SURE YOU:   Understand these instructions.  Will watch your condition.  Will get help right away if you are not doing well or get worse. Document Released: 04/02/2007 Document Revised: 08/28/2011 Document Reviewed: 04/22/2009 [facility] Patient Information 2014 [place], [place].

## 2013-07-29 NOTE — ED Notes (Signed)
Pt reporting sharp generalized abdominal pain starting about 2 hours ago.  Denies nausea, vomiting or problems with bowel movements.  Denies UTI symptoms.

## 2013-08-06 ENCOUNTER — Inpatient Hospital Stay (HOSPITAL_COMMUNITY)
Admission: AD | Admit: 2013-08-06 | Discharge: 2013-08-12 | DRG: 885 | Disposition: A | Discharge status: Home / Self Care | Payer: Medicare Other | Source: Intra-hospital | Attending: Psychiatry | Admitting: Psychiatry

## 2013-08-06 ENCOUNTER — Encounter (HOSPITAL_COMMUNITY): Payer: Self-pay | Admitting: Behavioral Health

## 2013-08-06 ENCOUNTER — Emergency Department (HOSPITAL_COMMUNITY)
Admission: EM | Admit: 2013-08-06 | Discharge: 2013-08-06 | Disposition: A | Discharge status: Other Acute Inpatient Hospital | Payer: Medicare Other | Source: Home / Self Care | Attending: Emergency Medicine | Admitting: Emergency Medicine

## 2013-08-06 ENCOUNTER — Encounter (HOSPITAL_COMMUNITY): Payer: Self-pay | Admitting: Emergency Medicine

## 2013-08-06 DIAGNOSIS — F39 Unspecified mood [affective] disorder: Secondary | ICD-10-CM | POA: Insufficient documentation

## 2013-08-06 DIAGNOSIS — R443 Hallucinations, unspecified: Secondary | ICD-10-CM | POA: Insufficient documentation

## 2013-08-06 DIAGNOSIS — F329 Major depressive disorder, single episode, unspecified: Secondary | ICD-10-CM | POA: Insufficient documentation

## 2013-08-06 DIAGNOSIS — E7800 Pure hypercholesterolemia, unspecified: Secondary | ICD-10-CM | POA: Diagnosis present

## 2013-08-06 DIAGNOSIS — G4700 Insomnia, unspecified: Secondary | ICD-10-CM | POA: Diagnosis present

## 2013-08-06 DIAGNOSIS — C189 Malignant neoplasm of colon, unspecified: Secondary | ICD-10-CM | POA: Diagnosis present

## 2013-08-06 DIAGNOSIS — Z85038 Personal history of other malignant neoplasm of large intestine: Secondary | ICD-10-CM | POA: Insufficient documentation

## 2013-08-06 DIAGNOSIS — Z91199 Patient's noncompliance with other medical treatment and regimen due to unspecified reason: Secondary | ICD-10-CM

## 2013-08-06 DIAGNOSIS — Z8739 Personal history of other diseases of the musculoskeletal system and connective tissue: Secondary | ICD-10-CM | POA: Insufficient documentation

## 2013-08-06 DIAGNOSIS — Z79899 Other long term (current) drug therapy: Secondary | ICD-10-CM | POA: Insufficient documentation

## 2013-08-06 DIAGNOSIS — F32A Depression, unspecified: Secondary | ICD-10-CM

## 2013-08-06 DIAGNOSIS — E669 Obesity, unspecified: Secondary | ICD-10-CM | POA: Insufficient documentation

## 2013-08-06 DIAGNOSIS — F3130 Bipolar disorder, current episode depressed, mild or moderate severity, unspecified: Principal | ICD-10-CM | POA: Diagnosis present

## 2013-08-06 DIAGNOSIS — Z9114 Patient's other noncompliance with medication regimen: Secondary | ICD-10-CM

## 2013-08-06 DIAGNOSIS — F411 Generalized anxiety disorder: Secondary | ICD-10-CM | POA: Diagnosis present

## 2013-08-06 DIAGNOSIS — F3181 Bipolar II disorder: Secondary | ICD-10-CM | POA: Diagnosis present

## 2013-08-06 DIAGNOSIS — Z9119 Patient's noncompliance with other medical treatment and regimen: Secondary | ICD-10-CM

## 2013-08-06 DIAGNOSIS — F3160 Bipolar disorder, current episode mixed, unspecified: Secondary | ICD-10-CM

## 2013-08-06 DIAGNOSIS — F4001 Agoraphobia with panic disorder: Secondary | ICD-10-CM | POA: Diagnosis present

## 2013-08-06 DIAGNOSIS — F17200 Nicotine dependence, unspecified, uncomplicated: Secondary | ICD-10-CM | POA: Insufficient documentation

## 2013-08-06 DIAGNOSIS — I10 Essential (primary) hypertension: Secondary | ICD-10-CM | POA: Diagnosis present

## 2013-08-06 DIAGNOSIS — F3289 Other specified depressive episodes: Secondary | ICD-10-CM | POA: Insufficient documentation

## 2013-08-06 HISTORY — DX: Malignant (primary) neoplasm, unspecified: C80.1

## 2013-08-06 LAB — COMPREHENSIVE METABOLIC PANEL
ALT: 10 U/L (ref 0–35)
AST: 15 U/L (ref 0–37)
Albumin: 4.3 g/dL (ref 3.5–5.2)
Alkaline Phosphatase: 96 U/L (ref 39–117)
BUN: 10 mg/dL (ref 6–23)
CALCIUM: 10.1 mg/dL (ref 8.4–10.5)
CO2: 27 mEq/L (ref 19–32)
Chloride: 103 mEq/L (ref 96–112)
Creatinine, Ser: 0.71 mg/dL (ref 0.50–1.10)
GFR calc Af Amer: >90 mL/min (ref >90)
GFR calc non Af Amer: >90 mL/min (ref >90)
Glucose, Bld: 106 mg/dL — ABNORMAL HIGH (ref 70–99)
Potassium: 3.6 mEq/L — ABNORMAL LOW (ref 3.7–5.3)
Sodium: 143 mEq/L (ref 137–147)
Total Bilirubin: 0.3 mg/dL (ref 0.3–1.2)
Total Protein: 8.1 g/dL (ref 6.0–8.3)

## 2013-08-06 LAB — CBC WITH DIFFERENTIAL/PLATELET
BASOS ABS: 0 10*3/uL (ref 0.0–0.1)
BASOS PCT: 0 % (ref 0–1)
EOS ABS: 0.1 10*3/uL (ref 0.0–0.7)
Eosinophils Relative: 3 % (ref 0–5)
HEMATOCRIT: 36.6 % (ref 36.0–46.0)
Hemoglobin: 12.8 g/dL (ref 12.0–15.0)
Lymphocytes Relative: 37 % (ref 12–46)
Lymphs Abs: 1.6 10*3/uL (ref 0.7–4.0)
MCH: 30.5 pg (ref 26.0–34.0)
MCHC: 35 g/dL (ref 30.0–36.0)
MCV: 87.4 fL (ref 78.0–100.0)
MONO ABS: 0.3 10*3/uL (ref 0.1–1.0)
Monocytes Relative: 6 % (ref 3–12)
Neutro Abs: 2.4 10*3/uL (ref 1.7–7.7)
Neutrophils Relative %: 54 % (ref 43–77)
PLATELETS: 259 10*3/uL (ref 150–400)
RBC: 4.19 MIL/uL (ref 3.87–5.11)
RDW: 15.2 % (ref 11.5–15.5)
WBC: 4.4 10*3/uL (ref 4.0–10.5)

## 2013-08-06 LAB — RAPID URINE DRUG SCREEN, HOSP PERFORMED
Amphetamines: NOT DETECTED
BENZODIAZEPINES: NOT DETECTED
Barbiturates: NOT DETECTED
COCAINE: NOT DETECTED
OPIATES: NOT DETECTED
TETRAHYDROCANNABINOL: NOT DETECTED

## 2013-08-06 LAB — ETHANOL: ALCOHOL ETHYL (B): 14 mg/dL — AB (ref 0–11)

## 2013-08-06 MED ORDER — LORAZEPAM 1 MG PO TABS
1.0000 mg | ORAL_TABLET | Freq: Once | ORAL | Status: AC
  Administered 2013-08-06: 1 mg via ORAL
  Filled 2013-08-06: qty 1

## 2013-08-06 MED ORDER — DICYCLOMINE HCL 20 MG PO TABS: 20.0000 mg | ORAL_TABLET | Freq: Two times a day (BID) | ORAL | Status: DC | PRN

## 2013-08-06 MED ORDER — ACETAMINOPHEN 325 MG PO TABS
650.0000 mg | ORAL_TABLET | Freq: Four times a day (QID) | ORAL | Status: DC | PRN
  Administered 2013-08-07 – 2013-08-08 (×2): 650 mg via ORAL
  Filled 2013-08-06 (×2): qty 2

## 2013-08-06 MED ORDER — AMLODIPINE BESYLATE 5 MG PO TABS
5.0000 mg | ORAL_TABLET | Freq: Every day | ORAL | Status: DC
Start: 2013-08-06 — End: 2013-08-07
  Administered 2013-08-06 – 2013-08-07 (×2): 5 mg via ORAL
  Filled 2013-08-06 (×4): qty 1

## 2013-08-06 MED ORDER — HYDROCHLOROTHIAZIDE 12.5 MG PO CAPS
12.5000 mg | ORAL_CAPSULE | Freq: Every day | ORAL | Status: DC
Start: 2013-08-06 — End: 2013-08-12
  Administered 2013-08-07 – 2013-08-12 (×6): 12.5 mg via ORAL
  Filled 2013-08-06 (×10): qty 1

## 2013-08-06 MED ORDER — BENAZEPRIL HCL 10 MG PO TABS
10.0000 mg | ORAL_TABLET | Freq: Every day | ORAL | Status: DC
  Administered 2013-08-06 – 2013-08-12 (×7): 10 mg via ORAL
  Filled 2013-08-06 (×10): qty 1

## 2013-08-06 MED ORDER — IBUPROFEN 800 MG PO TABS
ORAL_TABLET | ORAL | Status: AC
  Administered 2013-08-06: 800 mg
  Filled 2013-08-06: qty 1

## 2013-08-06 MED ORDER — CLONAZEPAM 0.5 MG PO TABS
0.5000 mg | ORAL_TABLET | Freq: Two times a day (BID) | ORAL | Status: DC | PRN
  Administered 2013-08-06: 0.5 mg via ORAL
  Filled 2013-08-06: qty 1

## 2013-08-06 MED ORDER — IBUPROFEN 200 MG PO TABS
400.0000 mg | ORAL_TABLET | Freq: Three times a day (TID) | ORAL | Status: DC | PRN
  Administered 2013-08-06 – 2013-08-07 (×2): 400 mg via ORAL
  Filled 2013-08-06 (×2): qty 2

## 2013-08-06 MED ORDER — IBUPROFEN 800 MG PO TABS
ORAL_TABLET | ORAL | Status: AC
  Filled 2013-08-06: qty 1

## 2013-08-06 MED ORDER — ALUM & MAG HYDROXIDE-SIMETH 200-200-20 MG/5ML PO SUSP: 30.0000 mL | ORAL | Status: DC | PRN

## 2013-08-06 MED ORDER — HYDROXYZINE HCL 25 MG PO TABS: 25.0000 mg | ORAL_TABLET | Freq: Four times a day (QID) | ORAL | Status: DC | PRN

## 2013-08-06 MED ORDER — TRAZODONE HCL 50 MG PO TABS: 50.0000 mg | ORAL_TABLET | Freq: Every evening | ORAL | Status: DC | PRN

## 2013-08-06 MED ORDER — MAGNESIUM HYDROXIDE 400 MG/5ML PO SUSP: 30.0000 mL | Freq: Every day | ORAL | Status: DC | PRN

## 2013-08-06 NOTE — ED Provider Notes (Signed)
CSN: [id number]     Arrival date & time 08/06/13  1008 History  This chart was scribed for Ashley Diego, MD by Ludger Nutting, ED Scribe. This patient was seen in room APA16A/APA16A and the patient's care was started 10:34 AM.    Chief Complaint  Patient presents with  . V70.1     The history is provided by the patient. No language interpreter was used.    HPI Comments: Ashley Patrick is a 58 y.o. female who presents to the Emergency Department complaining of feeling depressed and having auditory hallucinations that have worsened in the past few days. Pt states she had a [facility] appointment today but was advised to come here because she was unable to be seen. She was diagnosed with colon cancer in December 2014 and has been having SI and depression since then. She denies SI or HI currently.    Past Medical History  Diagnosis Date  . Back pain   . Obesity     History of  . Nicotine addiction   . Psychosis   . Hypertension   . OD (overdose of drug)     hospitalized for 11 days   . Bipolar 1 disorder   . Hypercholesteremia   . Migraine headache   . Lateral epicondylitis  of elbow     right  . Cancer    Past Surgical History  Procedure Laterality Date  . Partial hysterectomy    . Breast biopsy  10/18/2011    Procedure: BREAST BIOPSY;  Surgeon: Jamesetta So, MD;  Location: [facility];  Service: General;  Laterality: Left;  . Esophagogastroduodenoscopy N/A 12/26/2012    [id number] reflux esophagitis. Gastric and duodenal bulbar erosions-status post gastric biopsynegative H.pylori  . Abdominal hysterectomy    . Colonoscopy    . Colonoscopy N/A 05/29/2013    Procedure: COLONOSCOPY;  Surgeon: Daneil Dolin, MD;  Location: [facility];  Service: Endoscopy;  Laterality: N/A;  11:30  . Colon resection N/A 06/23/2013    Procedure: HAND ASSISTED LAPAROSCOPIC PARTIAL COLECTOMY;  Surgeon: Jamesetta So, MD;  Location: [facility];  Service: General;  Laterality: N/A;   Family History  Problem  Relation Age of Onset  . Anesthesia problems Neg Hx   . Hypotension Neg Hx   . Malignant hyperthermia Neg Hx   . Pseudochol deficiency Neg Hx   . Colon cancer Father     diagnosed in his 10s   History  Substance Use Topics  . Smoking status: Current Every Day Smoker -- 0.50 packs/day for 38 years    Types: Cigarettes  . Smokeless tobacco: Never Used  . Alcohol Use: No   OB History   Grav Para Term Preterm Abortions TAB SAB Ect Mult Living                 Review of Systems  Constitutional: Negative for appetite change and fatigue.  HENT: Negative for congestion, ear discharge and sinus pressure.   Eyes: Negative for discharge.  Respiratory: Negative for cough.   Cardiovascular: Negative for chest pain.  Gastrointestinal: Negative for abdominal pain and diarrhea.  Genitourinary: Negative for frequency and hematuria.  Musculoskeletal: Negative for back pain.  Skin: Negative for rash.  Neurological: Negative for seizures and headaches.  Psychiatric/Behavioral: Positive for hallucinations and dysphoric mood. Negative for suicidal ideas.      Allergies  Review of patient's allergies indicates no known allergies.  Home Medications   Current Outpatient Rx  Name  Route  Sig  Dispense  Refill  . amLODipine-benazepril (LOTREL) 5-10 MG per capsule   Oral   Take 1 capsule by mouth daily.         Marland Kitchen dicyclomine (BENTYL) 20 MG tablet   Oral   Take 1 tablet (20 mg total) by mouth 3 (three) times daily as needed for spasms.   10 tablet   0   . dicyclomine (BENTYL) 20 MG tablet   Oral   Take 1 tablet (20 mg total) by mouth 2 (two) times daily as needed for spasms.   20 tablet   0   . hydrochlorothiazide (MICROZIDE) 12.5 MG capsule   Oral   Take 12.5 mg by mouth daily.         Marland Kitchen ibuprofen (ADVIL,MOTRIN) 200 MG tablet   Oral   Take 200 mg by mouth every 8 (eight) hours as needed. Taking 2 tablets         . oxyCODONE-acetaminophen (PERCOCET/ROXICET) 5-325 MG per  tablet   Oral   Take 1-2 tablets by mouth every 6 (six) hours as needed for severe pain.   50 tablet   0   . polyethylene glycol (MIRALAX / GLYCOLAX) packet   Oral   Take 17 g by mouth daily.   14 each   0    BP 115/83  Pulse 104  Temp(Src) 98.5 F (36.9 C)  Resp 20  Ht 5\' 6"  (1.676 m)  Wt 160 lb (72.576 kg)  BMI 25.84 kg/m2  SpO2 99% Physical Exam  Constitutional: She is oriented to person, place, and time. She appears well-developed.  HENT:  Head: Normocephalic.  Eyes: Conjunctivae and EOM are normal. No scleral icterus.  Neck: Neck supple. No thyromegaly present.  Cardiovascular: Normal rate and regular rhythm.  Exam reveals no gallop and no friction rub.   No murmur heard. Pulmonary/Chest: No stridor. She has no wheezes. She has no rales. She exhibits no tenderness.  Abdominal: She exhibits no distension. There is no tenderness. There is no rebound.  Musculoskeletal: Normal range of motion. She exhibits no edema.  Lymphadenopathy:    She has no cervical adenopathy.  Neurological: She is oriented to person, place, and time. She exhibits normal muscle tone. Coordination normal.  Skin: No rash noted. No erythema.  Psychiatric: Her behavior is normal. She exhibits a depressed mood. She expresses no suicidal ideation.    ED Course  Procedures (including critical care time)  DIAGNOSTIC STUDIES: Oxygen Saturation is 98% on RA, normal by my interpretation.    COORDINATION OF CARE: 10:33 AM Discussed treatment plan with pt at bedside and pt agreed to plan.   Labs Review Labs Reviewed - No data to display Imaging Review No results found.  EKG Interpretation   None       MDM   Final diagnoses:  None    The chart was scribed for me under my direct supervision.  I personally performed the history, physical, and medical decision making and all procedures in the evaluation of this patient.Ashley Diego, MD 08/06/13 [phone]

## 2013-08-06 NOTE — ED Notes (Signed)
Pt made aware that she was going to [facility]. States she does not want to go to [facility] she wants to go home. Pt is agreeable to go

## 2013-08-06 NOTE — Tx Team (Signed)
Initial Interdisciplinary Treatment Plan  PATIENT STRENGTHS: (choose at least two) Ability for insight Capable of independent living General fund of knowledge Religious Affiliation Supportive family/friends  PATIENT STRESSORS: Loss of mother   PROBLEM LIST: Problem List/Patient Goals Date to be addressed Date deferred Reason deferred Estimated date of resolution  Depression 08/06/2013   D/C  AV hallucinations 08/06/2013   D/c                                             DISCHARGE CRITERIA:  Improved stabilization in mood, thinking, and/or behavior  PRELIMINARY DISCHARGE PLAN: Outpatient therapy  PATIENT/FAMIILY INVOLVEMENT: This treatment plan has been presented to and reviewed with the patient, Ashley Patrick, and/or family member.  The patient and family have been given the opportunity to ask questions and make suggestions.  Zigmund Daniel, Anayelli Lai Joy 10/04/4079, 4:48 PM2

## 2013-08-06 NOTE — ED Notes (Signed)
Pt reports has been depressed and having panic attacks.  Reports was diagnosed with colon cancer in December and was having SI while going through cancer treatments in Jan.  Pt says was told the cancer is gone but still feels depressed and hearing voices.  Denies any SI or HI today.  Last time felt suicidal was approx 1 month ago.  Pt says when she is alone in her apt at night, she hears voices that are telling her to start doing crack.  Pt says also has visual hallucinations.  Pt denies any drug use.  States, "I  think the people I see that do drugs and drink alcholol look "so happy."   Pt says during her "panic attacks" r arm gets weak and tingly.

## 2013-08-06 NOTE — ED Notes (Signed)
Patient left ED at this time with [facility].

## 2013-08-06 NOTE — ED Notes (Signed)
Patient restless, anxious. States she "can't be locked up in this room. MD aware. Verbal order for ativan obtained.

## 2013-08-06 NOTE — ED Notes (Signed)
[facility] [employer] at bedside. Patient's belongings returned, belongings returned from security as well.

## 2013-08-06 NOTE — Progress Notes (Signed)
D: Patient in her room on first approach.  Patient states she had just taken a shower and was lightheaded and dizzy.  Patient sat down and lightheadedness subsided.  Patient room at the time was extremely hot.  Patient states she gets anxious and has been depressed for years.  Patient states she just wants to be happy.  Patient states she feels something was taken from her when her mother passed away in May 29, 2013.  Patient passive SI but verbally contracts for safety.  Patient denies HI but states she hears voices in her head.  Patient will not elaborate on the voices.  Patient states she does not want to take medications unless they are for medical issues because they make her like a zombie.  After patient spoke with writer and got medications patient appears calmer and more relaxed. A: Staff to monitor Q 15 mins for safety.  Encouragement and support offered.  Scheduled medications administered per orders.  Klonopin administered prn for sleep.   R: Patient remains safe on the unit.  Patient did not attend group tonight.  Patient taking administered medications.  Patient visible on the unit before and after group.
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
## 2013-08-06 NOTE — ED Notes (Signed)
Patient states history of depression and anxiety to which she was seeking counseling at [facility] with Dr Ala Dach. Patient reports that she stopped taking her depression medication 2 years ago because it was making her "like a zombie". Reports doing well until about 1 year ago when her mother passed away. Recent colon cancer diagnosis in December for which she had colon resection as treatment. Patient reports increased SI with panic attacks. States right arm numbness and tingling with panic attacks. Reports 2-3 per week in frequency. States has been reading Bible and listening to [place] to help with symptoms. Patient tearful during assessment, withdrawn and flat affect. Medicated with Ibuprofen per request for abdominal pain with MD permission. Denies any further needs. Awaiting telepsych.
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
## 2013-08-06 NOTE — ED Notes (Signed)
Per Marlou Porch, at [facility].  Pt has been accepted by Heloise Purpura, PA for Dr. Louretta Shorten. 500 bed 2.  Call report  [phone]. Pt also, needs a vol consent signed

## 2013-08-07 DIAGNOSIS — Z91199 Patient's noncompliance with other medical treatment and regimen due to unspecified reason: Secondary | ICD-10-CM

## 2013-08-07 DIAGNOSIS — F4001 Agoraphobia with panic disorder: Secondary | ICD-10-CM

## 2013-08-07 DIAGNOSIS — Z91148 Patient's other noncompliance with medication regimen for other reason: Secondary | ICD-10-CM

## 2013-08-07 DIAGNOSIS — F3160 Bipolar disorder, current episode mixed, unspecified: Secondary | ICD-10-CM

## 2013-08-07 DIAGNOSIS — Z9119 Patient's noncompliance with other medical treatment and regimen: Secondary | ICD-10-CM

## 2013-08-07 DIAGNOSIS — Z9114 Patient's other noncompliance with medication regimen: Secondary | ICD-10-CM

## 2013-08-07 MED ORDER — ONDANSETRON 4 MG PO TBDP
ORAL_TABLET | ORAL | Status: AC
  Filled 2013-08-07: qty 2

## 2013-08-07 MED ORDER — PAROXETINE HCL ER 12.5 MG PO TB24
12.5000 mg | ORAL_TABLET | Freq: Every day | ORAL | Status: DC
  Administered 2013-08-07 – 2013-08-08 (×2): 12.5 mg via ORAL
  Filled 2013-08-07 (×5): qty 1

## 2013-08-07 MED ORDER — CLONAZEPAM 0.5 MG PO TABS
0.5000 mg | ORAL_TABLET | Freq: Two times a day (BID) | ORAL | Status: DC
  Administered 2013-08-07 – 2013-08-12 (×10): 0.5 mg via ORAL
  Filled 2013-08-07 (×10): qty 1

## 2013-08-07 MED ORDER — TRAZODONE HCL 50 MG PO TABS
50.0000 mg | ORAL_TABLET | Freq: Every evening | ORAL | Status: DC | PRN
  Administered 2013-08-07: 50 mg via ORAL

## 2013-08-07 MED ORDER — ARIPIPRAZOLE 5 MG PO TABS
5.0000 mg | ORAL_TABLET | Freq: Two times a day (BID) | ORAL | Status: DC
  Administered 2013-08-07 – 2013-08-08 (×3): 5 mg via ORAL
  Filled 2013-08-07 (×8): qty 1

## 2013-08-07 MED ORDER — ONDANSETRON 8 MG PO TBDP
8.0000 mg | ORAL_TABLET | Freq: Once | ORAL | Status: AC
  Administered 2013-08-07: 8 mg via ORAL

## 2013-08-07 MED ORDER — NICOTINE 14 MG/24HR TD PT24
14.0000 mg | MEDICATED_PATCH | Freq: Every day | TRANSDERMAL | Status: DC
  Administered 2013-08-07 – 2013-08-12 (×6): 14 mg via TRANSDERMAL
  Filled 2013-08-07 (×9): qty 1

## 2013-08-07 NOTE — Progress Notes (Signed)
Didn't attend group 

## 2013-08-07 NOTE — Progress Notes (Signed)
Recreation Therapy Notes  Date: 02.19.2015 Time: 2:45pm Location: 500 Hall Dayroom   Group Topic: Communication, Team Building, Problem Solving  Goal Area(s) Addresses:  Patient will effectively work with peer towards shared goal.  Patient will identify skill used to make activity successful.  Patient will identify how skills used during activity can be used to reach post d/c goals.   Behavioral Response: Engaged, Attentive, Appropriate   Intervention: Problem Solving Activitiy  Activity: Life Boat. Patients were given a scenario about being on a sinking yacht. Patients were informed the yacht included 69 guest, 8 of which could be placed on the life boat, along with all group members. Individuals on guest list were of varying socioeconomic classes such as a Information systems manager, Associate Professor, Recruitment consultant, Barrister's clerk.   Education: Education officer, community, Discharge Planning   Education Outcome: Acknowledges understanding  Clinical Observations/Feedback: Patient actively engaged in group activity, voicing her opinion and debating appropriately with peers. Patient made no contributions to group discussion, but appeared to actively listen as she maintained appropriate eye contact with speaker.     Laureen Ochs Kierston Plasencia, LRT/CTRS  Lane Hacker 08/07/2013 9:16 PM
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
## 2013-08-07 NOTE — BHH Suicide Risk Assessment (Signed)
   Nursing information obtained from:  Patient Demographic factors:  Living alone;Unemployed Current Mental Status:  Suicidal ideation indicated by patient Loss Factors:  Loss of significant relationship;Decline in physical health Historical Factors:  NA Risk Reduction Factors:  Religious beliefs about death;Sense of responsibility to family;Positive social support Total Time spent with patient: 45 minutes  CLINICAL FACTORS:   Panic Attacks Bipolar Disorder:   Mixed State More than one psychiatric diagnosis Currently Psychotic Previous Psychiatric Diagnoses and Treatments Medical Diagnoses and Treatments/Surgeries  Psychiatric Specialty Exam: Physical Exam  ROS  Blood pressure 125/83, pulse 88, temperature 97.6 F (36.4 C), temperature source Oral, resp. rate 16, height 5' 4.5" (1.638 m), weight 73.483 kg (162 lb).Body mass index is 27.39 kg/(m^2).  General Appearance: Bizarre and Guarded  Eye Contact::  Fair  Speech:  Clear and Coherent and Pressured  Volume:  Normal  Mood:  Anxious, Depressed, Hopeless, Irritable and Worthless  Affect:  Constricted and Depressed  Thought Process:  Circumstantial, Disorganized and Tangential  Orientation:  Full (Time, Place, and Person)  Thought Content:  Hallucinations: Auditory Visual, Paranoid Ideation and Rumination  Suicidal Thoughts:  Yes.  without intent/plan  Homicidal Thoughts:  No  Memory:  Immediate;   Fair  Judgement:  Intact  Insight:  Fair  Psychomotor Activity:  Psychomotor Retardation and Restlessness  Concentration:  Fair  Recall:  [place]  Language: Fair  Akathisia:  NA  Handed:  Right  AIMS (if indicated):     Assets:  Communication Skills Desire for Improvement Housing Intimacy Resilience Social Support  Sleep:  Number of Hours: 6   Musculoskeletal: Strength & Muscle Tone: within normal limits Gait & Station: normal Patient leans: N/A  COGNITIVE FEATURES THAT CONTRIBUTE TO RISK:   Closed-mindedness Loss of executive function Polarized thinking Thought constriction (tunnel vision)    SUICIDE RISK:   Moderate:  Frequent suicidal ideation with limited intensity, and duration, some specificity in terms of plans, no associated intent, good self-control, limited dysphoria/symptomatology, some risk factors present, and identifiable protective factors, including available and accessible social support.  PLAN OF CARE: Admit voluntarily and emergently for crisis stabilization, safety monitoring and medication management of bipolar disorder most recent episode was mixed with the panic disorder  with panic attacks along with noncompliance with medications over 2 years.  I certify that inpatient services furnished can reasonably be expected to improve the patient's condition.  Halvor Behrend,JANARDHAHA R. 08/07/2013, 9:49 AM
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
## 2013-08-07 NOTE — Progress Notes (Signed)
Patient ID: Ashley Patrick, female   DOB: 1955/09/02, 58 y.o.   MRN: [id number]  D: Pt. Denies HI but does endorse passive SI and A/V hallucinations. Patient reports that she can hear voices that are command in nature and she sees a short man in a black coat and rain hat. Patient contracts for safety. Pt rates her depression 10/10 and her hopelessness at 8/10 for the day. Patient is still reporting pain in her abdomen from her surgery she had in January. Writer will continue   A: Support and encouragement provided to the patient to not isolate in her room and go to groups. Scheduled medications given to patient per physician's orders.  R: Patient is receptive and cooperative but minimal. Patient does have a tendency to be child like when speaking with this Probation officer. Patient is seen in the milieu at times. Q15 minute checks are maintained for safety.

## 2013-08-07 NOTE — Progress Notes (Signed)
Patient ID: Ashley Patrick, female   DOB: 1956/03/22, 58 y.o.   MRN: [id number]  [facility] 9:00 AM  The focus of this group is to educate the patient on the purpose and policies of crisis stabilization and provide a format to answer questions about their admission.  The group details unit policies and expectations of patients while admitted.  Patient did not attend group.

## 2013-08-07 NOTE — Progress Notes (Signed)
Patient ID: EBBA GOLL, female   DOB: 23-Dec-1955, 58 y.o.   MRN: [id number] PER STATE REGULATIONS 482.30  THIS CHART WAS REVIEWED FOR MEDICAL NECESSITY WITH RESPECT TO THE PATIENT'S ADMISSION/ DURATION OF STAY.  NEXT REVIEW DATE: 08/10/2013  Chauncy Lean, RN, BSN CASE MANAGER

## 2013-08-07 NOTE — [facility] Group Notes (Signed)
[place] LCSW Group Therapy  08/07/2013  1:15 PM   Type of Therapy:  Group Therapy  Participation Level:  Active  Participation Quality:  Attentive, Sharing and Supportive  Affect:  Depressed and Flat  Cognitive:  Alert and Oriented  Insight:  Developing/Improving and Engaged  Engagement in Therapy:  Developing/Improving and Engaged  Modes of Intervention:  Clarification, Confrontation, Discussion, Education, Exploration, Limit-setting, Orientation, Problem-solving, Rapport Building, Art therapist, Socialization and Support  Summary of Progress/Problems: The topic for group was balance in life.  Today's group focused on defining balance in one's own words, identifying things that can knock one off balance, and exploring healthy ways to maintain balance in life. Group members were asked to provide an example of a time when they felt off balance, describe how they handled that situation,and process healthier ways to regain balance in the future. Group members were asked to share the most important tool for maintaining balance that they learned while at [facility] and how they plan to apply this method after discharge. Pt shared that her life has been off balance due to being off her meds for 2 years.  Pt states because of this she has had mood swings and was hearing voices.  Pt also processed how she was able to be very active and feels unable to do anything anymore.  CSW prompted pt to name what she is able to do now and pt was able to list quite a few things.  Pt was able to relate to peers, actively participated and was engaged in group discussion.    Regan Lemming, LCSW 08/07/2013 2:15 PM

## 2013-08-08 DIAGNOSIS — F411 Generalized anxiety disorder: Secondary | ICD-10-CM | POA: Diagnosis present

## 2013-08-08 DIAGNOSIS — F3181 Bipolar II disorder: Secondary | ICD-10-CM | POA: Diagnosis present

## 2013-08-08 DIAGNOSIS — F29 Unspecified psychosis not due to a substance or known physiological condition: Secondary | ICD-10-CM

## 2013-08-08 DIAGNOSIS — F3130 Bipolar disorder, current episode depressed, mild or moderate severity, unspecified: Secondary | ICD-10-CM | POA: Diagnosis present

## 2013-08-08 MED ORDER — POTASSIUM CHLORIDE CRYS ER 10 MEQ PO TBCR
10.0000 meq | EXTENDED_RELEASE_TABLET | Freq: Every day | ORAL | Status: DC
  Administered 2013-08-08 – 2013-08-12 (×5): 10 meq via ORAL
  Filled 2013-08-08 (×8): qty 1

## 2013-08-08 MED ORDER — RISPERIDONE 0.5 MG PO TABS
0.5000 mg | ORAL_TABLET | Freq: Two times a day (BID) | ORAL | Status: DC
  Administered 2013-08-08 – 2013-08-12 (×8): 0.5 mg via ORAL
  Filled 2013-08-08 (×13): qty 1

## 2013-08-08 MED ORDER — PROSIGHT PO TABS
1.0000 | ORAL_TABLET | Freq: Every day | ORAL | Status: DC
  Administered 2013-08-08 – 2013-08-12 (×5): 1 via ORAL
  Filled 2013-08-08 (×7): qty 1

## 2013-08-08 MED ORDER — AMLODIPINE BESYLATE 5 MG PO TABS
5.0000 mg | ORAL_TABLET | Freq: Once | ORAL | Status: AC
  Administered 2013-08-08: 5 mg via ORAL
  Filled 2013-08-08 (×2): qty 1

## 2013-08-08 MED ORDER — RAMELTEON 8 MG PO TABS
8.0000 mg | ORAL_TABLET | Freq: Every day | ORAL | Status: DC
  Administered 2013-08-08 – 2013-08-10 (×3): 8 mg via ORAL
  Filled 2013-08-08 (×6): qty 1

## 2013-08-08 MED ORDER — SERTRALINE HCL 25 MG PO TABS
25.0000 mg | ORAL_TABLET | Freq: Every day | ORAL | Status: DC
  Administered 2013-08-08 – 2013-08-12 (×5): 25 mg via ORAL
  Filled 2013-08-08 (×8): qty 1

## 2013-08-08 MED ORDER — ENSURE COMPLETE PO LIQD
237.0000 mL | Freq: Two times a day (BID) | ORAL | Status: DC
  Administered 2013-08-08 – 2013-08-11 (×8): 237 mL via ORAL

## 2013-08-08 MED ORDER — BACITRACIN-NEOMYCIN-POLYMYXIN OINTMENT TUBE
TOPICAL_OINTMENT | Freq: Two times a day (BID) | CUTANEOUS | Status: DC
  Administered 2013-08-08 – 2013-08-09 (×2): 15 via TOPICAL
  Administered 2013-08-09: 17:00:00 via TOPICAL
  Administered 2013-08-10 (×2): 15 via TOPICAL
  Filled 2013-08-08: qty 15

## 2013-08-08 NOTE — Tx Team (Deleted)
Interdisciplinary Treatment Plan Update   Date Reviewed:  08/08/2013  Time Reviewed:  9:34 AM  Progress in Treatment:   Attending groups: Yes Participating in groups: Yes Taking medication as prescribed: Yes  Tolerating medication: Yes Family/Significant other contact made:  No, but will ask patient for consent for collateral contact Patient understands diagnosis: Yes  Discussing patient identified problems/goals with staff: Yes Medical problems stabilized or resolved: Yes Denies suicidal/homicidal ideation: Yes Patient has not harmed self or others: Yes  For review of initial/current patient goals, please see plan of care.  Estimated Length of Stay:  3-4 days  Reasons for Continued Hospitalization:  Anxiety Depression Medication stabilization  New Problems/Goals identified:    Discharge Plan or Barriers:   Home with outpatient follow up with Delrae Alfred  Additional Comments:  Continue medication stabilization  Attendees:  Patient:  08/08/2013 9:34 AM   Signature: Mylinda Latina, MD 08/08/2013 9:34 AM  Signature:  Drake Leach, RN 08/08/2013 9:34 AM  Signature:   08/08/2013 9:34 AM  Signature: 08/08/2013 9:34 AM  Signature:   08/08/2013 9:34 AM  Signature:  Joette Catching, LCSW 08/08/2013 9:34 AM  Signature:   08/08/2013 9:34 AM  Signature:  Lucinda Dell, Care Coordinator [facility] 08/08/2013 9:34 AM  Signature:   08/08/2013 9:34 AM  Signature: 08/08/2013  9:34 AM  Signature:   Lars Pinks, RN [facility] 08/08/2013  9:34 AM  Signature:  08/08/2013  9:34 AM    Scribe for Treatment Team:   Joette Catching,  08/08/2013 9:34 AM

## 2013-08-08 NOTE — Progress Notes (Signed)
[facility] MD Progress Note  08/08/2013 1:38 PM Ashley Patrick  MRN:  [id number] Subjective:  Patient complaining of not being able to sleep, stated Trazodone does not work for her--Rozerem ordered.  She continues to have visual and auditory hallucinations, sees a man with a rain coat.  Her appetite is fair, depression continues related to her cancer diagnosis and the recent death of her mother.  The medications she was taking a year ago did not work and made her feel like a "zombie."   Diagnosis:   DSM5:  Total Time spent with patient: 45 minutes  Axis I: Anxiety Disorder NOS, Bipolar, Depressed and Psychotic Disorder NOS Axis II: Deferred Axis III:  Past Medical History  Diagnosis Date  . Back pain   . Obesity     History of  . Nicotine addiction   . Psychosis   . Hypertension   . OD (overdose of drug)     hospitalized for 11 days   . Bipolar 1 disorder   . Hypercholesteremia   . Migraine headache   . Lateral epicondylitis  of elbow     right  . Cancer    Axis IV: other psychosocial or environmental problems, problems related to social environment and problems with primary support group Axis V: 41-50 serious symptoms  ADL's:  Intact  Sleep: Poor  Appetite:  Fair  Suicidal Ideation:  Plan:  vague Intent:  none Means:  none Homicidal Ideation:  None  Psychiatric Specialty Exam: Physical Exam  Constitutional: She is oriented to person, place, and time. She appears well-developed and well-nourished.  HENT:  Head: Normocephalic and atraumatic.  Neck: Normal range of motion.  Respiratory: Effort normal.  Musculoskeletal: Normal range of motion.  Neurological: She is alert and oriented to person, place, and time.    Review of Systems  Constitutional: Negative.   HENT: Negative.   Eyes: Negative.   Respiratory: Negative.   Cardiovascular: Negative.   Gastrointestinal: Negative.   Genitourinary: Negative.   Musculoskeletal: Negative.   Skin: Negative.    Neurological: Negative.   Endo/Heme/Allergies: Negative.   Psychiatric/Behavioral: Positive for depression, suicidal ideas and hallucinations. The patient is nervous/anxious.     Blood pressure 107/67, pulse 101, temperature 97.6 F (36.4 C), temperature source Oral, resp. rate 16, height 5' 4.5" (1.638 m), weight 162 lb (73.483 kg).Body mass index is 27.39 kg/(m^2).  General Appearance: Casual  Eye Contact::  Fair  Speech:  Normal Rate  Volume:  Normal  Mood:  Anxious and Depressed  Affect:  Congruent  Thought Process:  Coherent  Orientation:  Full (Time, Place, and Person)  Thought Content:  Hallucinations: Auditory Visual  Suicidal Thoughts:  Yes.  without intent/plan  Homicidal Thoughts:  No  Memory:  Immediate;   Fair Recent;   Fair Remote;   Fair  Judgement:  Fair  Insight:  Fair  Psychomotor Activity:  Decreased  Concentration:  Fair  Recall:  [employer] of Knowledge:Fair  Language: Fair  Akathisia:  No  Handed:  Right  AIMS (if indicated):     Assets:  Leisure Time Resilience Social Support  Sleep:  Number of Hours: 6   Musculoskeletal: Strength & Muscle Tone: within normal limits Gait & Station: normal Patient leans: N/A  Current Medications: Current Facility-Administered Medications  Medication Dose Route Frequency Provider Last Rate Last Dose  . acetaminophen (TYLENOL) tablet 650 mg  650 mg Oral Q6H PRN Benjamine Mola, FNP   650 mg at 08/07/13 1409  . alum &  mag hydroxide-simeth (MAALOX/MYLANTA) 200-200-20 MG/5ML suspension 30 mL  30 mL Oral Q4H PRN Benjamine Mola, FNP      . amLODipine (NORVASC) tablet 5 mg  5 mg Oral Once Waylan Boga, NP      . ARIPiprazole (ABILIFY) tablet 5 mg  5 mg Oral BID Durward Parcel, MD   5 mg at 08/08/13 0847  . benazepril (LOTENSIN) tablet 10 mg  10 mg Oral Daily Benjamine Mola, FNP   10 mg at 08/08/13 0847  . clonazePAM (KLONOPIN) tablet 0.5 mg  0.5 mg Oral BID Durward Parcel, MD   0.5 mg at 08/08/13  1049  . feeding supplement (ENSURE COMPLETE) (ENSURE COMPLETE) liquid 237 mL  237 mL Oral BID BM Darrol Jump, RD      . hydrochlorothiazide (MICROZIDE) capsule 12.5 mg  12.5 mg Oral Daily Benjamine Mola, FNP   12.5 mg at 08/08/13 0847  . magnesium hydroxide (MILK OF MAGNESIA) suspension 30 mL  30 mL Oral Daily PRN Benjamine Mola, FNP      . multivitamin (PROSIGHT) tablet 1 tablet  1 tablet Oral Daily Darrol Jump, RD   1 tablet at 08/08/13 1320  . nicotine (NICODERM CQ - dosed in mg/24 hours) patch 14 mg  14 mg Transdermal Daily Durward Parcel, MD   14 mg at 08/08/13 0847  . PARoxetine (PAXIL-CR) 24 hr tablet 12.5 mg  12.5 mg Oral Daily Durward Parcel, MD   12.5 mg at 08/08/13 0847  . potassium chloride (K-DUR,KLOR-CON) CR tablet 10 mEq  10 mEq Oral Daily Waylan Boga, NP      . ramelteon (ROZEREM) tablet 8 mg  8 mg Oral QHS Waylan Boga, NP        Lab Results: No results found for this or any previous visit (from the past 48 hour(s)).  Physical Findings: AIMS:  , ,  ,  ,    CIWA:    COWS:     Treatment Plan Summary: Daily contact with patient to assess and evaluate symptoms and progress in treatment Medication management  Plan:  Review of chart, vital signs, medications, and notes. 1-Individual and group therapy 2-Medication management for depression and anxiety:   Medications reviewed with the patient and she stated Abilify not working, Risperdal started; Trazdone discontinued, Rozerem started; Paxil did not work in the past, changed to [employer] for depression and anxiety 3-Coping skills for depression, anxiety, and insomnia 4-Continue crisis stabilization and management 5-Address health issues--monitoring vital signs, stable 6-Treatment plan in progress to prevent relapse of depression and anxiety  Medical Decision Making Problem Points:  Established problem, stable/improving (1) and Review of psycho-social stressors (1) Data Points:  Review of medication  regiment & side effects (2) Review of new medications or change in dosage (2)  I certify that inpatient services furnished can reasonably be expected to improve the patient's condition.   Waylan Boga, [place] 08/08/2013, 1:38 PM  Reviewed the information documented and agree with the treatment plan.  Lavonte Palos,JANARDHAHA R. 08/11/2013 8:33 AM

## 2013-08-08 NOTE — BHH Group Notes (Signed)
[facility] LCSW Aftercare Discharge Planning Group Note   08/08/2013 11:21 AM    Participation Quality:  Appropraite  Mood/Affect:  Appropriate  Depression Rating:  4  Anxiety Rating:  5  Thoughts of Suicide:  No  Will you contract for safety?   NA  Current AVH:  No  Plan for Discharge/Comments:  Patient attended discharge planning group and actively participated in group.  Patient will follow up with [facility]. CSW provided all participants with daily workbook.   Transportation Means: Patient has transportation.   Supports:  Patient has a support system.   Naitik Hermann, [place] Post

## 2013-08-08 NOTE — BHH Group Notes (Signed)
[facility] LCSW Group Therapy  Feelings Around Relapse 1:15 -2:30        08/08/2013  3:00 PM   Type of Therapy:  Group Therapy  Participation Level:  Appropriate  Participation Quality:  Appropriate  Affect:  Appropriate  Cognitive:  Attentive Appropriate  Insight:  Developing/Improving  Engagement in Therapy: Developing/Improving  Modes of Intervention:  Discussion Exploration Problem-Solving Supportive  Summary of Progress/Problems:  The topic for today was feelings around relapse. Patient processed feelings toward relapse and was able to relate to peers. Patient advised she would start drinking again.  Patient shared she drank very heavily when she was younger.  Patient identified coping skills that can be used to prevent a relapse.   Concha Pyo 08/08/2013 3:00 PM

## 2013-08-08 NOTE — Tx Team (Signed)
Interdisciplinary Treatment Plan Update   Date Reviewed:  08/08/2013  Time Reviewed:  9:54 AM  Progress in Treatment:   Attending groups: Yes Participating in groups: Yes Taking medication as prescribed: Yes  Tolerating medication: Yes Family/Significant other contact made:  No, but will ask patient for consent for collateral contact Patient understands diagnosis: Yes  Discussing patient identified problems/goals with staff: Yes Medical problems stabilized or resolved: Yes Denies suicidal/homicidal ideation: Yes Patient has not harmed self or others: Yes  For review of initial/current patient goals, please see plan of care.  Estimated Length of Stay:  3-4 days  Reasons for Continued Hospitalization:  Anxiety Depression Medication stabilization   New Problems/Goals identified:    Discharge Plan or Barriers:   Home with outpatient follow up with [name]  Additional Comments:  Ashley Patrick is a 58 yo single African American female admitted voluntarily and emergently to [facility] from [facility] emergency department with worsening symptoms of depression, anxiety, and auditory and visual hallucinations. She has been noncompliant psychotropic meds for two years and has decompensated over the past year after the death of her mother. She was seeking outpatient medication management from Dr. Reece Levy and counseling from [name] at [facility] two years ago. Patient stated she was taking about 8-9 medication which are stopped on her own because they made her feel like a zombie. She is also a poor historian with her medications except for taking cymbalta which made her see bugs. She was diagnosed with colon cancer in December 2014 and had a recent resection of multiple colon polyps. She admits to a history of suicidal thoughts by cutting herself and overdose on medications but denies ever thinking of a plan. The suicidal thoughts are typically passive and intermitent.     Attendees:  Patient:  08/08/2013 9:54 AM   Signature: Mylinda Latina, MD 08/08/2013 9:54 AM  Signature:   08/08/2013 9:54 AM  Signature:  Drake Leach, RN  08/08/2013 9:54 AM  Signature: 08/08/2013 9:54 AM  Signature:   08/08/2013 9:54 AM  Signature:  Joette Catching, LCSW 08/08/2013 9:54 AM  Signature:  Regan Lemming, LCSW 08/08/2013 9:54 AM  Signature:  Lucinda Dell, Care Coordinator [facility] 08/08/2013 9:54 AM  Signature:  08/08/2013 9:54 AM  Signature:  08/08/2013  9:54 AM  Signature:   Lars Pinks, RN [facility] 08/08/2013  9:54 AM  Signature:   08/08/2013  9:54 AM    Scribe for Treatment Team:   Joette Catching,  08/08/2013 9:54 AM

## 2013-08-08 NOTE — Progress Notes (Signed)
Recreation Therapy Notes  Date: 02.20.2015 Time: 2:45pm Location: [address] Dayroom   Group Topic: Communication, Team Building, Problem Solving  Goal Area(s) Addresses:  Patient will effectively work with peer towards shared goal.  Patient will identify skill used to make activity successful.  Patient will identify how skills used during activity can be used to reach post d/c goals.   Behavioral Response: Engaged, Attentive, Appropriate   Intervention: Problem Solving Activity  Activity: Landing Pad. In teams patients were given 12 plastic drinking straws and a length of masking tape. Using the materials provided patients were asked to build a landing pad to catch a golf ball dropped from approximately 6 feet in the air.   Education: Education officer, community, Dentist.   Education Outcome: Acknowledges understanding   Clinical Observations/Feedback: Patient actively engaged in group activity, offering suggestions for teams landing pad and assisting with Architect. Patient made no contributions to group discussion, but appeared to actively listen as she maintained appropriate eye contact with speaker.   Laureen Ochs Jade Burkard, LRT/CTRS  Lane Hacker 08/08/2013 4:15 PM

## 2013-08-08 NOTE — Progress Notes (Signed)
D:  Patient has been up and active in the milieu.  Appears flat/sad, but brightens when engaged in conversation especially when she starts talking about her family.  She has been attending and participating in groups.   A:  Medications given as prescribed.  Explained new medications to her.  Offered support and encouragement.  R:  Cooperative with staff.  Interacting well with peers.  Verbalized understanding of medications.  Safety is maintained.

## 2013-08-08 NOTE — BHH Suicide Risk Assessment (Signed)
[place] INPATIENT:  Family/Significant Other Suicide Prevention Education  Suicide Prevention Education:  Education Completed; Ashley Patrick - sister [phone]),  (name of family member/significant other) has been identified by the patient as the family member/significant other with whom the patient will be residing, and identified as the person(s) who will aid the patient in the event of a mental health crisis (suicidal ideations/suicide attempt).  With written consent from the patient, the family member/significant other has been provided the following suicide prevention education, prior to the and/or following the discharge of the patient.  The suicide prevention education provided includes the following:  Suicide risk factors  Suicide prevention and interventions  National Suicide Hotline telephone number  [facility] assessment telephone number  [facility] Emergency Assistance [place] and/or Residential Mobile Crisis Unit telephone number  Request made of family/significant other to:  Remove weapons (e.g., guns, rifles, knives), all items previously/currently identified as safety concern.    Remove drugs/medications (over-the-counter, prescriptions, illicit drugs), all items previously/currently identified as a safety concern.  The family member/significant other verbalizes understanding of the suicide prevention education information provided.  The family member/significant other agrees to remove the items of safety concern listed above. Sister states that she has seen great improvement already and pt is starting to sound like the "old [place]".    Ane Payment 08/08/2013, 10:41 AM

## 2013-08-08 NOTE — Progress Notes (Signed)
[facility] Group Notes:  (Nursing/MHT/Case Management/Adjunct)  Date:  08/08/2013  Time:  11:30 PM  Type of Therapy:  Group Therapy  Participation Level:  Minimal  Participation Quality:  Appropriate  Affect:  Appropriate  Cognitive:  Appropriate  Insight:  Appropriate and Good  Engagement in Group:  Engaged  Modes of Intervention:  Socialization and Support  Summary of Progress/Problems: Pt. Stated she enjoyed groups and learning about depression watching a film in group.  Pt. Stated he plan to prevent relapse was to stay on her medications.  Lanell Persons 08/08/2013, 11:30 PM

## 2013-08-08 NOTE — Progress Notes (Signed)
D:  Pt +ve AVH- not command. Pt denies SI/HI/. Pt is pleasant and cooperative. All I want to get is on my medications.   A: Pt was offered support and encouragement. Pt was given scheduled medications. Pt was encourage to attend groups. Q 15 minute checks were done for safety.   R: Pt is taking medication. Pt has no complaints at this time.Pt receptive to treatment and safety maintained on unit.

## 2013-08-08 NOTE — Progress Notes (Signed)
Pt observed resting in bed with eyes closed. No acute distress. No further bouts of N/V. Level III obs in place for safety and pt is safe. Ashley Patrick
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
## 2013-08-09 DIAGNOSIS — F411 Generalized anxiety disorder: Secondary | ICD-10-CM

## 2013-08-09 NOTE — BHH Group Notes (Signed)
[facility] Group Notes:  (Clinical Social Work)  08/09/2013   1:15-2:15PM  Summary of Progress/Problems:   The main focus of today's process group was for the patient to identify ways in which they have sabotaged their own mental health wellness/recovery.  Motivational interviewing and a handout were used to explore the benefits and costs of their self-sabotaging behavior as well as the benefits and costs of changing this behavior.  The Stages of Change were explained to the group using a handout, and patients identified where they are with regard to changing self-defeating behaviors.  The patient expressed she self-sabotages with isolation and withdrawal because of rage that she feels growing inside her whenever she interacts with family members who have caused conflict and drama her entire life.  She questioned whether she is to blame for this, and was receptive to group feedback.  Type of Therapy:  Process Group  Participation Level:  Active  Participation Quality:  Appropriate, Attentive and Sharing  Affect:  Depressed  Cognitive:  Oriented  Insight:  Developing/Improving  Engagement in Therapy:  Engaged  Modes of Intervention:  Education, Motivational Interviewing   Selmer Dominion, LCSW 08/09/2013, 4:00pm

## 2013-08-09 NOTE — Progress Notes (Signed)
D: Pt mood is depressed. She states that her day wasn't going well initially but it has improved since then. She has been attending groups.  A: Support given. Verbalization encouraged. Pt encouraged to come to staff with any concerns. Medications given as ordered. R: Pt is responsive. No complaints of pain or discomfort at this time. Q15 min safety checks maintained. Will continue to monitor pt.

## 2013-08-09 NOTE — Progress Notes (Addendum)
[facility] MD Progress Note  08/09/2013 11:39 AM Ashley Patrick  MRN:  [id number] Subjective:    Patient complaining of not being able to sleep, stated Trazodone does not work for her--Rozerem ordered.  She reports "a good night's sleep" and her appetite is good. She rate her depression today 4/10 and anxiety 5/10 She continues to have visual and auditory hallucinations, sees a man with a rain coat. This has been ongoing since age 58 yrs She is participating in groups and is hopeful to discharge home on Monday. She is requesting assistance with securing an in-home aid, will discuss with SW    Diagnosis:   Anxiety Disorder NOS, Bipolar, Depressed and Psychotic Disorder NOS  DSM5:  Total Time spent with patient: 35 min  Axis I: Anxiety Disorder NOS, Bipolar, Depressed and Psychotic Disorder NOS Axis II: Deferred Axis III:  Past Medical History  Diagnosis Date  . Back pain   . Obesity     History of  . Nicotine addiction   . Psychosis   . Hypertension   . OD (overdose of drug)     hospitalized for 11 days   . Bipolar 1 disorder   . Hypercholesteremia   . Migraine headache   . Lateral epicondylitis  of elbow     right  . Cancer    Axis IV: other psychosocial or environmental problems, problems related to social environment and problems with primary support group Axis V: 41-50 serious symptoms  ADL's:  Intact  Sleep: Fair  Appetite:  Fair  Suicidal Ideation: denies presently Homicidal Ideation: denies prestnly None  Psychiatric Specialty Exam: Physical Exam  Constitutional: She is oriented to person, place, and time. She appears well-developed and well-nourished.  HENT:  Head: Normocephalic and atraumatic.  Neck: Normal range of motion.  Respiratory: Effort normal.  Musculoskeletal: Normal range of motion.  Neurological: She is alert and oriented to person, place, and time.    Review of Systems  Constitutional: Negative.   HENT: Negative.   Eyes: Negative.    Respiratory: Negative.   Cardiovascular: Negative.   Gastrointestinal: Negative.   Genitourinary: Negative.   Musculoskeletal: Negative.   Skin: Negative.   Neurological: Negative.   Endo/Heme/Allergies: Negative.   Psychiatric/Behavioral: Positive for depression, suicidal ideas and hallucinations. The patient is nervous/anxious.     Blood pressure 107/88, pulse 99, temperature 97.6 F (36.4 C), temperature source Oral, resp. rate 16, height 5' 4.5" (1.638 m), weight 73.483 kg (162 lb).Body mass index is 27.39 kg/(m^2).  General Appearance: Casual  Eye Contact::  Good  Speech:  Normal Rate  Volume:  Normal  Mood: Calm and cooperative  Affect:  Congruent  Thought Process:  Coherent  Orientation:  Full (Time, Place, and Person)  Thought Content:  Hallucinations: Auditory Visual  Suicidal Thoughts:  No  Homicidal Thoughts:  No  Memory:  Immediate;   Fair Recent;   Fair Remote;   Fair  Judgement:  Fair  Insight:  Fair  Psychomotor Activity:  Decreased  Concentration:  Fair  Recall:  [employer] of Knowledge:Fair  Language: Fair  Akathisia:  No  Handed:  Right  AIMS (if indicated):     Assets:  Leisure Time Resilience Social Support  Sleep:  Number of Hours: 6.5   Musculoskeletal: Strength & Muscle Tone: within normal limits Gait & Station: normal Patient leans: N/A  Current Medications: Current Facility-Administered Medications  Medication Dose Route Frequency Provider Last Rate Last Dose  . acetaminophen (TYLENOL) tablet 650 mg  650  mg Oral Q6H PRN Benjamine Mola, FNP   650 mg at 08/08/13 2145  . alum & mag hydroxide-simeth (MAALOX/MYLANTA) 200-200-20 MG/5ML suspension 30 mL  30 mL Oral Q4H PRN Benjamine Mola, FNP      . benazepril (LOTENSIN) tablet 10 mg  10 mg Oral Daily Benjamine Mola, FNP   10 mg at 08/09/13 0749  . clonazePAM (KLONOPIN) tablet 0.5 mg  0.5 mg Oral BID Durward Parcel, MD   0.5 mg at 08/09/13 0751  . feeding supplement (ENSURE  COMPLETE) (ENSURE COMPLETE) liquid 237 mL  237 mL Oral BID BM Darrol Jump, RD   237 mL at 08/09/13 1000  . hydrochlorothiazide (MICROZIDE) capsule 12.5 mg  12.5 mg Oral Daily Benjamine Mola, FNP   12.5 mg at 08/09/13 0751  . magnesium hydroxide (MILK OF MAGNESIA) suspension 30 mL  30 mL Oral Daily PRN Benjamine Mola, FNP      . multivitamin (PROSIGHT) tablet 1 tablet  1 tablet Oral Daily Darrol Jump, RD   1 tablet at 08/09/13 0751  . neomycin-bacitracin-polymyxin (NEOSPORIN) ointment   Topical BID Waylan Boga, NP   15 application at 11/91/47 0751  . nicotine (NICODERM CQ - dosed in mg/24 hours) patch 14 mg  14 mg Transdermal Daily Durward Parcel, MD   14 mg at 08/09/13 0752  . potassium chloride (K-DUR,KLOR-CON) CR tablet 10 mEq  10 mEq Oral Daily Waylan Boga, NP   10 mEq at 08/09/13 0752  . ramelteon (ROZEREM) tablet 8 mg  8 mg Oral QHS Waylan Boga, NP   8 mg at 08/08/13 2144  . risperiDONE (RISPERDAL) tablet 0.5 mg  0.5 mg Oral BID Waylan Boga, NP   0.5 mg at 08/09/13 8295  . sertraline (ZOLOFT) tablet 25 mg  25 mg Oral Daily Waylan Boga, NP   25 mg at 08/09/13 6213    Lab Results: No results found for this or any previous visit (from the past 48 hour(s)).  Physical Findings: AIMS:  , ,  ,  ,    CIWA:    COWS:     Treatment Plan Summary: Daily contact with patient to assess and evaluate symptoms and progress in treatment Medication management  Plan:  Review of chart, vital signs, medications, and notes. 1-Individual and group therapy 2-Medication management for depression and anxiety:  Risperdal and   Rozerem initiated  this admission. Paxil did not work in the past, changed to Zoloft for depression and anxiety. Patient states "I feel better than before I came in" 3-Coping skills for depression, anxiety, and insomnia 4-Continue crisis stabilization and management 5-Address health issues--monitoring vital signs, stable 6-Treatment plan in progress to prevent  relapse of depression and anxiety  Medical Decision Making Problem Points:  Established problem, stable/improving (1) and Review of psycho-social stressors (1) Data Points:  Review of medication regiment & side effects (2) Review of new medications or change in dosage (2)  I certify that inpatient services furnished can reasonably be expected to improve the patient's condition.   [facility], [place] 08/09/2013, 11:39 AM Agree with assessment and plan Geralyn Flash A. Sabra Heck, M.D.

## 2013-08-09 NOTE — Progress Notes (Signed)
Adult Psychoeducational Group Note  Date:  08/09/2013 Time:  9:10 PM  Group Topic/Focus:  Wrap-Up Group:   The focus of this group is to help patients review their daily goal of treatment and discuss progress on daily workbooks.  Participation Level:  Active  Participation Quality:  Appropriate  Affect:  Appropriate  Cognitive:  Appropriate  Insight: Appropriate  Engagement in Group:  Engaged  Modes of Intervention:  Discussion  Additional Comments: The patient expressed in group that she enjoyed group.The patient said that she learned self-control and isolation withdrawal techniques.   Ashley Patrick 08/09/2013, 9:10 PM

## 2013-08-09 NOTE — BHH Group Notes (Signed)
[facility] Group Notes:  (Nursing/MHT/Case Management/Adjunct)  Date:  08/09/2013  Time:  10:51 AM  Type of Therapy:  Nurse Education  Participation Level:  Active  Participation Quality:  Appropriate  Affect:  Appropriate  Cognitive:  Alert  Insight:  Appropriate  Engagement in Group:  Engaged  Modes of Intervention:  Discussion  Summary of Progress/Problems:pt attended the group on the [place], Ashley Patrick 08/09/2013, 10:51 AM

## 2013-08-09 NOTE — Progress Notes (Signed)
Psychoeducational Group Note  Date: 08/09/2013 Time:  1015  Group Topic/Focus:  Identifying Needs:   The focus of this group is to help patients identify their personal needs that have been historically problematic and identify healthy behaviors to address their needs.  Participation Level:  Active  Participation Quality:  Appropriate  Affect:  Appropriate  Cognitive:  Oriented  Insight:  Improving  Engagement in Group:  Engaged  Additional Comments:  Attended and participated in the group  Satomi Buda A  

## 2013-08-09 NOTE — BHH Group Notes (Signed)
[facility] Group Notes:  (Nursing/MHT/Case Management/Adjunct)  Date:  08/09/2013  Time:  2:30 PM  Type of Therapy:  Nurse Education  Participation Level:  Active  Participation Quality:  Appropriate  Affect:  Appropriate  Cognitive:  Appropriate  Insight:  Appropriate  Engagement in Group:  Engaged  Modes of Intervention:  Education  Summary of Progress/Problems:Pt attended the group   Marcello Moores [facility] 08/09/2013, 2:30 PM

## 2013-08-09 NOTE — Progress Notes (Addendum)
Pt states she feels less anxious today. She appears in good spirits and contracts for  safety. Pt states her hopelessness is a 4/10 and her depression is a 3/10. Pt was instructed to drink fluid today as she appears slightly dehydrated. She has good eye contact and states she has been going to groups. Pt came up tot he nurse very tearful around 4:30pm stating she did not want to go to dinner. She stated she has noone to sit with at meals and felt some of the staff were laughing at her. Pt asked to please be permitted to stay back from supper . Spoke with the charge nurse and pt did stay back.

## 2013-08-10 MED ORDER — HYDROXYZINE HCL 50 MG PO TABS
50.0000 mg | ORAL_TABLET | Freq: Once | ORAL | Status: AC
  Administered 2013-08-10: 50 mg via ORAL
  Filled 2013-08-10 (×2): qty 1

## 2013-08-10 NOTE — Progress Notes (Signed)
Adult Psychoeducational Group Note  Date:  08/10/2013 Time:  3:05PM  Group Topic/Focus:  Making Healthy Choices:   The focus of this group is to help patients identify negative/unhealthy choices they were using prior to admission and identify positive/healthier coping strategies to replace them upon discharge.  Participation Level:  Active  Participation Quality:  Appropriate  Affect:  Appropriate  Cognitive:  Appropriate  Insight: Appropriate  Engagement in Group:  Engaged  Modes of Intervention:  Discussion  Additional Comments:  Pt was attentive throughout group   Geralda Baumgardner K 08/10/2013, 3:18 PM

## 2013-08-10 NOTE — BHH Group Notes (Signed)
[facility] Group Notes:  (Clinical Social Work)  08/10/2013   1:15-2:15PM  Summary of Progress/Problems:  The main focus of today's process group was to   identify the patient's current support system and decide on other supports that can be put in place.  The picture on workbook was used to discuss why additional supports are needed.  An emphasis was placed on using counselor, doctor, therapy groups, 12-step groups, and problem-specific support groups to expand supports.   There was also an extensive discussion about what constitutes a healthy support versus an unhealthy support.  The patient expressed full comprehension of the concepts presented, and agreed that there is a need to add more supports.  The patient stated she has attended support groups in the past but has also seen other people's business put out in public after sharing in a support group, so she does not think it is for her.  Additionally, she stated that a church member whom she told about her psychiatric issues told her to stop taking the medication, that she didn't need it.  This eventually led to her hospitalization here.  The group processed with her that this does not mean she must isolate herself, but that she can use the people in her support system to figure out if advice from certain people is good or bad for her.  Type of Therapy:  Process Group  Participation Level:  Active  Participation Quality:  Attentive and Sharing  Affect:  Blunted and Depressed  Cognitive:  Appropriate and Oriented  Insight:  Engaged  Engagement in Therapy:  Engaged  Modes of Intervention:  Education,  Support and [employer], LCSW 08/10/2013, 4:00pm

## 2013-08-10 NOTE — Progress Notes (Signed)
Psychoeducational Group Note  Date:  08/10/2013 Time:  1015  Group Topic/Focus:  Making Healthy Choices:   The focus of this group is to help patients identify negative/unhealthy choices they were using prior to admission and identify positive/healthier coping strategies to replace them upon discharge.  Participation Level:  Active  Participation Quality:  Appropriate  Affect:  appropriate Cognitive:  Oriented  Insight:  Improving  Engagement in Group:  Engaged  Additional Comments:   Jash Wahlen A 08/10/2013  

## 2013-08-10 NOTE — Progress Notes (Signed)
Spoke with pt 1:1. She reports she's doing well and is looking forward to discharge tomorrow. No complaints physically or psychiatrically related. Reports she always has AVH and has since age 58 and considers this baseline. Pt given support. Will medicate per orders as pt states she is not ready for scheduled rozerem at this time. Denies SI/HI and remains safe. Ashley Patrick

## 2013-08-10 NOTE — Progress Notes (Signed)
Adult Psychoeducational Group Note  Date:  08/10/2013 Time:  10:47 PM  Group Topic/Focus:  Wrap-Up Group:   The focus of this group is to help patients review their daily goal of treatment and discuss progress on daily workbooks.  Participation Level:  Minimal  Participation Quality:  Appropriate  Affect:  Appropriate  Cognitive:  Appropriate  Insight: Appropriate  Engagement in Group:  Limited  Modes of Intervention:  Support  Additional Comments:  Pt mentioned that she enjoyed interacting more with her peers and she is becoming more comfortable on the unit. Pt mentioned that she plans to go to counseling once she gets discharged  Ashley Patrick 08/10/2013, 10:47 PM

## 2013-08-10 NOTE — Progress Notes (Signed)
[facility] MD Progress Note  08/10/2013 11:19 AM Ashley Patrick  MRN:  [id number] Subjective:   Pateint report interrupted sleep last night but is willing to try the Rezerem tonight again.  We discussed sleep hygiene and realistic expectations for sleep. Her appetite is good.  She rate her depression today 7/10 and anxiety 5/10 She continues to have visual and auditory hallucinations, sees a man with a rain coat. "this has happened twice today" This has been ongoing since age 58 yrs, this is baseline according to patient She is participating in groups and is hopeful to discharge home on Monday. She is requesting assistance with securing an in-home aid, will discuss with SW  She is hopeful for dc home soon, her plan is to return home with her daughter and sister as support people.  She hopes to get connected to a service in [place] other than [employer].  She is hopeful for the possibility of getting an appointment with a counselor for talk therapy.   She is encouraged to be an active participant in her own healthcare needs.   Diagnosis:   Anxiety Disorder NOS, Bipolar, Depressed and Psychotic Disorder NOS  DSM5:  Total Time spent with patient: 35 min  Axis I: Anxiety Disorder NOS, Bipolar, Depressed and Psychotic Disorder NOS Axis II: Deferred Axis III:  Past Medical History  Diagnosis Date  . Back pain   . Obesity     History of  . Nicotine addiction   . Psychosis   . Hypertension   . OD (overdose of drug)     hospitalized for 11 days   . Bipolar 1 disorder   . Hypercholesteremia   . Migraine headache   . Lateral epicondylitis  of elbow     right  . Cancer    Axis IV: other psychosocial or environmental problems, problems related to social environment and problems with primary support group Axis V: 41-50 serious symptoms  ADL's:  Intact  Sleep: Fair  Appetite:  Fair  Suicidal Ideation: denies presently Homicidal Ideation: denies prestnly None  Psychiatric Specialty  Exam: Physical Exam  Constitutional: She is oriented to person, place, and time. She appears well-developed and well-nourished.  HENT:  Head: Normocephalic and atraumatic.  Neck: Normal range of motion.  Respiratory: Effort normal.  Musculoskeletal: Normal range of motion.  Neurological: She is alert and oriented to person, place, and time.    Review of Systems  Constitutional: Negative.   HENT: Negative.   Eyes: Negative.   Respiratory: Negative.   Cardiovascular: Negative.   Gastrointestinal: Negative.   Genitourinary: Negative.   Musculoskeletal: Negative.   Skin: Negative.   Neurological: Negative.   Endo/Heme/Allergies: Negative.   Psychiatric/Behavioral: Positive for depression, suicidal ideas and hallucinations. The patient is nervous/anxious.     Blood pressure 107/75, pulse 103, temperature 97.9 F (36.6 C), temperature source Oral, resp. rate 16, height 5' 4.5" (1.638 m), weight 73.483 kg (162 lb).Body mass index is 27.39 kg/(m^2).  General Appearance: Casual  Eye Contact::  Good  Speech:  Normal Rate  Volume:  Normal  Mood: Calm and cooperative  Affect:  Congruent  Thought Process:  Coherent  Orientation:  Full (Time, Place, and Person)  Thought Content:  Hallucinations: Auditory Visual  Suicidal Thoughts:  No  Homicidal Thoughts:  No  Memory:  Immediate;   Fair Recent;   Fair Remote;   Fair  Judgement:  Fair  Insight:  Fair  Psychomotor Activity:  Decreased  Concentration:  Fair  Recall:  Fair  Fund of Knowledge:Fair  Language: Fair  Akathisia:  No  Handed:  Right  AIMS (if indicated):     Assets:  Leisure Time Resilience Social Support  Sleep:  Number of Hours: 6   Musculoskeletal: Strength & Muscle Tone: within normal limits Gait & Station: normal Patient leans: N/A  Current Medications: Current Facility-Administered Medications  Medication Dose Route Frequency Provider Last Rate Last Dose  . acetaminophen (TYLENOL) tablet 650 mg  650 mg  Oral Q6H PRN Benjamine Mola, FNP   650 mg at 08/08/13 2145  . alum & mag hydroxide-simeth (MAALOX/MYLANTA) 200-200-20 MG/5ML suspension 30 mL  30 mL Oral Q4H PRN Benjamine Mola, FNP      . benazepril (LOTENSIN) tablet 10 mg  10 mg Oral Daily Benjamine Mola, FNP   10 mg at 08/10/13 7425  . clonazePAM (KLONOPIN) tablet 0.5 mg  0.5 mg Oral BID Durward Parcel, MD   0.5 mg at 08/10/13 0839  . feeding supplement (ENSURE COMPLETE) (ENSURE COMPLETE) liquid 237 mL  237 mL Oral BID BM Darrol Jump, RD   237 mL at 08/10/13 0842  . hydrochlorothiazide (MICROZIDE) capsule 12.5 mg  12.5 mg Oral Daily Benjamine Mola, FNP   12.5 mg at 08/10/13 9563  . magnesium hydroxide (MILK OF MAGNESIA) suspension 30 mL  30 mL Oral Daily PRN Benjamine Mola, FNP      . multivitamin (PROSIGHT) tablet 1 tablet  1 tablet Oral Daily Darrol Jump, RD   1 tablet at 08/10/13 [phone]  . neomycin-bacitracin-polymyxin (NEOSPORIN) ointment   Topical BID Waylan Boga, NP   15 application at 43/32/95 [phone]  . nicotine (NICODERM CQ - dosed in mg/24 hours) patch 14 mg  14 mg Transdermal Daily Durward Parcel, MD   14 mg at 08/10/13 0839  . potassium chloride (K-DUR,KLOR-CON) CR tablet 10 mEq  10 mEq Oral Daily Waylan Boga, NP   10 mEq at 08/10/13 0839  . ramelteon (ROZEREM) tablet 8 mg  8 mg Oral QHS Waylan Boga, NP   8 mg at 08/09/13 2133  . risperiDONE (RISPERDAL) tablet 0.5 mg  0.5 mg Oral BID Waylan Boga, NP   0.5 mg at 08/10/13 1660  . sertraline (ZOLOFT) tablet 25 mg  25 mg Oral Daily Waylan Boga, NP   25 mg at 08/10/13 6301    Lab Results: No results found for this or any previous visit (from the past 48 hour(s)).  Physical Findings: AIMS:  , ,  ,  ,    CIWA:    COWS:     Treatment Plan Summary: Daily contact with patient to assess and evaluate symptoms and progress in treatment Medication management  Plan:  Review of chart, vital signs, medications, and notes. 1-Individual and group  therapy 2-Medication management for depression and anxiety:  Risperdal and  Rozerem initiated  this admission.  Patient states "I feel better today" Discussed side effects and expectataions 3-Coping skills for depression, anxiety, and insomnia 4-Continue crisis stabilization and management 5-Address health issues--monitoring vital signs, stable 6-Treatment plan in progress to prevent relapse of depression and anxiety  Medical Decision Making Problem Points:  Established problem, stable/improving (1) and Review of psycho-social stressors (1) Data Points:  Review of medication regiment & side effects (2) Review of new medications or change in dosage (2)  I certify that inpatient services furnished can reasonably be expected to improve the patient's condition.   [facility], [place], [place] 08/10/2013, 11:19 AM Agree with assessment and plan  Geralyn Flash A. Sabra Heck, M.D.

## 2013-08-10 NOTE — Progress Notes (Signed)
Psychoeducational Group Note  Psychoeducational Group Note  Date: 08/10/2013 Time:  0930  Group Topic/Focus:  Gratefulness:  The focus of this group is to help patients identify what two things they are most grateful for in their lives. What helps ground them and to center them on their work to their recovery.  Participation Level:  Active  Participation Quality:  Appropriate  Affect:  Appropriate  Cognitive:  Oriented  Insight:  Improving  Engagement in Group:  Engaged  Additional Comments:  Participated fully in the group  Bryson Dames A

## 2013-08-10 NOTE — Progress Notes (Addendum)
Ashley Patrick is more comfortable and feels much better today as evidenced by her laughing and joking with her peers as well as the staff.   A She completes her morning self inventory and on it she writes she  has had SI within the past 24 hrs, but she contracts for safety with this writer,she rates her depression and hopelessness "4/4" and  Is currently working on her dc plans as this note is written.   R Safety is in place na dpoc cont.

## 2013-08-11 MED ORDER — DOXEPIN HCL 25 MG PO CAPS
25.0000 mg | ORAL_CAPSULE | Freq: Every evening | ORAL | Status: DC | PRN
  Administered 2013-08-11: 25 mg via ORAL
  Filled 2013-08-11: qty 1

## 2013-08-11 NOTE — BHH Group Notes (Signed)
[employer] LCSW Group Therapy  08/11/2013  1:15 PM   Type of Therapy:  Group Therapy  Participation Level:  Active  Participation Quality:  Attentive, Sharing and Supportive  Affect:  Calm  Cognitive:  Alert and Oriented  Insight:  Developing/Improving and Engaged  Engagement in Therapy:  Developing/Improving and Engaged  Modes of Intervention:  Clarification, Confrontation, Discussion, Education, Exploration, Limit-setting, Orientation, Problem-solving, Rapport Building, Art therapist, Socialization and Support  Summary of Progress/Problems: Pt identified obstacles faced currently and processed barriers involved in overcoming these obstacles. Pt identified steps necessary for overcoming these obstacles and explored motivation (internal and external) for facing these difficulties head on. Pt further identified one area of concern in their lives and chose a goal to focus on for today.  Pt shared that her biggest obstacle was being off her meds and losing her mother in December.  Pt explained what happened when she was off her meds, having mood swings and hearing voices and seeing things.  Pt states that she than lost her mother and found out she has cancer, which caused her to feel like she had no reason to live.  Pt states that she plans to rely on her faith, stay on her meds and keep her follow up appointments to overcome these obstacles.  Pt was in a positive spirit today, encouraging peers in group discussion.  Pt actively participated and was engaged in group discussion.    Regan Lemming, LCSW 08/11/2013 2:47 PM

## 2013-08-11 NOTE — Progress Notes (Signed)
Patient ID: Ashley Patrick, female   DOB: [birth year], 58 y.o.   MRN: [id number] [facility] MD Progress Note  08/11/2013 6:33 PM ELLIANNA RUEST  MRN:  [id number]  Subjective:  Pt rates depression and anxiety both at 3/10. Pt denies SI, HI, and AVH. Pt became irritated at first, stating that she "stayed back from the gym in case you guys came by". Pt was informed that we would never ask someone to stay back from any activity unless we were coming to see them at that moment or very soon. Pt admitted that she was not told we would come at a certain time, but that she was "hoping you would come so I just stayed" to see [place]. Pt calmed down and reported the above information. Pt is stating she is ready for discharge and wants to follow-up with outpatient treatment.   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 Back pain   . Obesity     History of  . Nicotine addiction   . Psychosis   . Hypertension   . OD (overdose of drug)     hospitalized for 11 days   . Bipolar 1 disorder   . Hypercholesteremia   . Migraine headache   . Lateral epicondylitis  of elbow     right  . Cancer    Axis IV: other psychosocial or environmental problems, problems related to social environment and problems with primary support group Axis V: 41-50 serious symptoms  ADL's:  Intact  Sleep: Poor  Appetite:  Good  Suicidal Ideation:  Denies Homicidal Ideation:  Denies  Psychiatric Specialty Exam: Physical Exam  Constitutional: She is oriented to person, place, and time. She appears well-developed and well-nourished.  HENT:  Head: Normocephalic and atraumatic.  Neck: Normal range of motion.  Respiratory: Effort normal.  Musculoskeletal: Normal range of motion.  Neurological: She is alert and oriented to person, place, and time.    Review of  Systems  Constitutional: Negative.   HENT: Negative.   Eyes: Negative.   Respiratory: Negative.   Cardiovascular: Negative.   Gastrointestinal: Negative.   Genitourinary: Negative.   Musculoskeletal: Negative.   Skin: Negative.   Neurological: Negative.   Endo/Heme/Allergies: Negative.   Psychiatric/Behavioral: Positive for depression (3/10). Negative for suicidal ideas and hallucinations. The patient is nervous/anxious (3/10) and has insomnia (states current medications are not working).     Blood pressure 115/81, pulse 114, temperature 98.1 F (36.7 C), temperature source Oral, resp. rate 18, height 5' 4.5" (1.638 m), weight 73.483 kg (162 lb).Body mass index is 27.39 kg/(m^2).  General Appearance: Casual  Eye Contact::  Fair  Speech:  Normal Rate  Volume:  Normal  Mood:  Anxious  Affect:  Congruent  Thought Process:  Coherent  Orientation:  Full (Time, Place, and Person)  Thought Content:  WDL  Suicidal Thoughts:  No  Homicidal Thoughts:  No  Memory:  Immediate;   Fair Recent;   Fair Remote;   Fair  Judgement:  Fair  Insight:  Fair  Psychomotor Activity:  Decreased  Concentration:  Fair  Recall:  [employer] of Knowledge:Fair  Language: Fair  Akathisia:  No  Handed:  Right  AIMS (if indicated):     Assets:  Leisure Time Resilience Social Support  Sleep:  Number of Hours: 6.25   Musculoskeletal:  Strength & Muscle Tone: within normal limits Gait & Station: normal Patient leans: N/A  Current Medications: Current Facility-Administered Medications  Medication Dose Route Frequency Provider Last Rate Last Dose  . acetaminophen (TYLENOL) tablet 650 mg  650 mg Oral Q6H PRN Benjamine Mola, FNP   650 mg at 08/08/13 2145  . alum & mag hydroxide-simeth (MAALOX/MYLANTA) 200-200-20 MG/5ML suspension 30 mL  30 mL Oral Q4H PRN Benjamine Mola, FNP      . benazepril (LOTENSIN) tablet 10 mg  10 mg Oral Daily Benjamine Mola, FNP   10 mg at 08/11/13 0748  . clonazePAM (KLONOPIN)  tablet 0.5 mg  0.5 mg Oral BID Durward Parcel, MD   0.5 mg at 08/11/13 1700  . feeding supplement (ENSURE COMPLETE) (ENSURE COMPLETE) liquid 237 mL  237 mL Oral BID BM Darrol Jump, RD   237 mL at 08/11/13 1431  . hydrochlorothiazide (MICROZIDE) capsule 12.5 mg  12.5 mg Oral Daily Benjamine Mola, FNP   12.5 mg at 08/11/13 0748  . magnesium hydroxide (MILK OF MAGNESIA) suspension 30 mL  30 mL Oral Daily PRN Benjamine Mola, FNP      . multivitamin (PROSIGHT) tablet 1 tablet  1 tablet Oral Daily Darrol Jump, RD   1 tablet at 08/11/13 [phone]  . neomycin-bacitracin-polymyxin (NEOSPORIN) ointment   Topical BID Waylan Boga, NP   15 application at 02/54/27 1858  . nicotine (NICODERM CQ - dosed in mg/24 hours) patch 14 mg  14 mg Transdermal Daily Durward Parcel, MD   14 mg at 08/11/13 0751  . potassium chloride (K-DUR,KLOR-CON) CR tablet 10 mEq  10 mEq Oral Daily Waylan Boga, NP   10 mEq at 08/11/13 0748  . ramelteon (ROZEREM) tablet 8 mg  8 mg Oral QHS Waylan Boga, NP   8 mg at 08/10/13 2224  . risperiDONE (RISPERDAL) tablet 0.5 mg  0.5 mg Oral BID Waylan Boga, NP   0.5 mg at 08/11/13 1700  . sertraline (ZOLOFT) tablet 25 mg  25 mg Oral Daily Waylan Boga, NP   25 mg at 08/11/13 0623    Lab Results: No results found for this or any previous visit (from the past 48 hour(s)).  Physical Findings: AIMS:  , ,  ,  ,    CIWA:    COWS:     Treatment Plan Summary: Daily contact with patient to assess and evaluate symptoms and progress in treatment Medication management  Plan:  Review of chart, vital signs, medications, and notes. 1-Individual and group therapy 2-Medication management for depression and anxiety:   Medications reviewed with the patient and she stated Abilify not working, Risperdal started; Trazdone discontinued, Rozerem started; Paxil did not work in the past, changed to [employer] for depression and anxiety  -Add Doxepin 25mg  qhs prn for insomnia -Remove Rozerem  for sleep  3-Coping skills for depression, anxiety, and insomnia 4-Continue crisis stabilization and management 5-Address health issues--monitoring vital signs, stable 6-Treatment plan in progress to prevent relapse of depression and anxiety  Medical Decision Making Problem Points:  Established problem, stable/improving (1) and Review of psycho-social stressors (1) Data Points:  Review of medication regiment & side effects (2) Review of new medications or change in dosage (2)  I certify that inpatient services furnished can reasonably be expected to improve the patient's condition.   Benjamine Mola, FNP-BC 08/11/2013, 6:33 PM  Reviewed the information documented and agree with the treatment plan.  Airika Alkhatib,JANARDHAHA R. 08/12/2013 2:53 PM

## 2013-08-11 NOTE — Progress Notes (Signed)
Patient ID: Ashley Patrick, female   DOB: [birth year], 58 y.o.   MRN: [id number] D: pt. Visible in dayroom interacting, reports depression at "3" of 10. Pt. Reports groups been helpful "I was focused on them" "good speakers" "I saw that withdrawing was good when it was to separate from negative things, but bad if separating from positive things." pt. Reports she has been hospitalized about seven times at [facility], last admit was four years ago. Reports stressor as losing mom in 2012. A: Writer introduced self to client and encouraged her to continue positive affirmations. Staff will monitor q59min for safety. R: pt. Is safe on the unit. Pt. Attended group.

## 2013-08-11 NOTE — Progress Notes (Signed)
Adult Psychoeducational Group Note  Date:  08/11/2013 Time:  9:30 PM  Group Topic/Focus:  Wrap-Up Group:   The focus of this group is to help patients review their daily goal of treatment and discuss progress on daily workbooks.  Participation Level:  Active  Participation Quality:  Appropriate  Affect:  Appropriate  Cognitive:  Appropriate  Insight: Appropriate  Engagement in Group:  Engaged  Modes of Intervention:  Support  Additional Comments:  Pt stated that positive thing she learned was that she gets to go home tomorrow. Says that while she has been here she has enjoyed group. Her advice to her peers is to attend all meetings.   Floyde Dingley 08/11/2013, 9:30 PM

## 2013-08-11 NOTE — Tx Team (Signed)
Interdisciplinary Treatment Plan Update (Adult)  Date: 08/11/2013  Time Reviewed:  9:45 AM  Progress in Treatment: Attending groups: Yes Participating in groups:  Yes Taking medication as prescribed:  Yes Tolerating medication:  Yes Family/Significant othe contact made: Yes, with pt's sister Patient understands diagnosis:  Yes Discussing patient identified problems/goals with staff:  Yes Medical problems stabilized or resolved:  Yes Denies suicidal/homicidal ideation: Yes Issues/concerns per patient self-inventory:  Yes Other:  New problem(s) identified: N/A  Discharge Plan or Barriers: CSW will refer [facility] to [facility] for outpatient medication management and therapy  Reason for Continuation of Hospitalization: Anxiety Depression Medication Stabilization  Comments: N/A  Estimated length of stay: 1 day, d/c tomorrow  For review of initial/current patient goals, please see plan of care.  Attendees: Patient:     Family:     Physician:  Dr. Zorita Pang 08/11/2013 10:32 AM   Nursing:   Grayland Ormond, RN 08/11/2013 10:32 AM   Clinical Social Worker:  Regan Lemming, LCSW 08/11/2013 10:32 AM   Other: Catalina Pizza, PA 08/11/2013 10:32 AM   Other:  Gala Romney, care coordination 08/11/2013 10:32 AM   Other:  Joette Catching, LCSW 08/11/2013 10:32 AM   Other:  Thurnell Garbe, RN 08/11/2013 10:32 AM   Other: Markham Jordan, RN 08/11/2013 10:32 AM   Other:    Other:    Other:    Other:    Other:     Scribe for Treatment Team:   Ane Payment, 08/11/2013 10:32 AM

## 2013-08-11 NOTE — Progress Notes (Signed)
Patient ID: Ashley Patrick, female   DOB: 18-Jan-1956, 58 y.o.   MRN: [id number] PER STATE REGULATIONS 482.30  THIS CHART WAS REVIEWED FOR MEDICAL NECESSITY WITH RESPECT TO THE PATIENT'S ADMISSION/ DURATION OF STAY.  NEXT REVIEW DATE: 08/14/2013  Chauncy Lean, RN, BSN CASE MANAGER

## 2013-08-11 NOTE — Progress Notes (Signed)
D:  Per pt self inventory pt reports sleeping fair, appetite improving, energy level normal, ability to pay attention improving, rates depression at a 3 out of 10 and hopelessness at a 3 out of 10, denies SI/Hi/AVH.    A:  Emotional support provided, Encouraged pt to continue with treatment plan and attend all group activities, q15 min checks maintained for safety.  R:  Pt is receptive, calm and cooperative, pleasant with staff and other patients, going to groups.

## 2013-08-11 NOTE — B[facility] Group Notes (Signed)
[facility] LCSW Aftercare Discharge Planning Group Note   08/11/2013 8:45 AM  Participation Quality:  Alert, Appropriate and Oriented  Mood/Affect:  Calm  Depression Rating: 3    Anxiety Rating:  3  Thoughts of Suicide:  Pt denies SI/HI  Will you contract for safety?   Yes  Current AVH:  Pt denies  Plan for Discharge/Comments:  Pt attended discharge planning group and actively participated in group.  CSW provided pt with today's workbook.  Pt reports feeling well today.  Pt is hopeful to d/c soon.  Pt will return home in [facility] and now wants to [facility] up [facility] medication management and therapy.  CSW will make referral.  No further needs voiced by pt at this time.    Transportation Means: Pt reports access to transportation - daughter to pick pt up  Supports: No supports mentioned at this time  Regan Lemming, [place] 08/11/2013 10:02 AM

## 2013-08-12 MED ORDER — DOXEPIN HCL 25 MG PO CAPS: 25.0000 mg | ORAL_CAPSULE | Freq: Every evening | ORAL | Status: DC | PRN

## 2013-08-12 MED ORDER — RISPERIDONE 0.5 MG PO TABS: 0.5000 mg | ORAL_TABLET | Freq: Two times a day (BID) | ORAL | Status: DC

## 2013-08-12 MED ORDER — POTASSIUM CHLORIDE CRYS ER 10 MEQ PO TBCR: 10.0000 meq | EXTENDED_RELEASE_TABLET | Freq: Every day | ORAL | Status: DC

## 2013-08-12 MED ORDER — CLONAZEPAM 0.5 MG PO TABS: 0.5000 mg | ORAL_TABLET | Freq: Two times a day (BID) | ORAL | Status: DC | PRN

## 2013-08-12 MED ORDER — SERTRALINE HCL 25 MG PO TABS: 25.0000 mg | ORAL_TABLET | Freq: Every day | ORAL | Status: DC
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
## 2013-08-12 NOTE — Progress Notes (Signed)
Discharge note: Pt received both written and verbal discharge instructions. Pt agreed to f/u appt and med regimen. Pt verbalized understanding of discharge summary. Pt received all belongings and prescriptions prior to discharge. Pt denies SI at time of discharge.

## 2013-08-12 NOTE — [facility] Suicide Risk Assessment (Signed)
   Demographic Factors:  Adolescent or young adult, Caucasian and Unemployed  Total Time spent with patient: 30 minutes  Psychiatric Specialty Exam: Physical Exam  ROS  Blood pressure 107/75, pulse 120, temperature 97.8 F (36.6 C), temperature source Oral, resp. rate 17, height 5' 4.5" (1.638 m), weight 73.483 kg (162 lb).Body mass index is 27.39 kg/(m^2).  General Appearance: Casual  Eye Contact::  Good  Speech:  Clear and Coherent  Volume:  Normal  Mood:  Euthymic  Affect:  Appropriate and Congruent  Thought Process:  Goal Directed and Intact  Orientation:  Full (Time, Place, and Person)  Thought Content:  WDL  Suicidal Thoughts:  No  Homicidal Thoughts:  No  Memory:  Immediate;   Fair  Judgement:  Good  Insight:  Good  Psychomotor Activity:  Normal  Concentration:  Good  Recall:  Good  Fund of Knowledge:Good  Language: Good  Akathisia:  NA  Handed:  Right  AIMS (if indicated):     Assets:  Communication Skills Desire for Improvement Financial Resources/Insurance Housing Intimacy Leisure Time Physical Health Resilience Social Support Transportation  Sleep:  Number of Hours: 6    Musculoskeletal: Strength & Muscle Tone: within normal limits Gait & Station: normal Patient leans: N/A   Mental Status Per Nursing Assessment::   On Admission:  Suicidal ideation indicated by patient  Current Mental Status by Physician: NA  Loss Factors: NA  Historical Factors: Prior suicide attempts, Family history of mental illness or substance abuse and Impulsivity  Risk Reduction Factors:   Sense of responsibility to family, Religious beliefs about death, Living with another person, especially a relative, Positive social support, Positive therapeutic relationship and Positive coping skills or problem solving skills  Continued Clinical Symptoms:  Bipolar Disorder:   Depressive phase Depression:   Recent sense of peace/wellbeing Previous Psychiatric Diagnoses and  Treatments Medical Diagnoses and Treatments/Surgeries  Cognitive Features That Contribute To Risk:  Polarized thinking    Suicide Risk:  Minimal: No identifiable suicidal ideation.  Patients presenting with no risk factors but with morbid ruminations; may be classified as minimal risk based on the severity of the depressive symptoms  Discharge Diagnoses:   AXIS I:  Bipolar, Depressed AXIS II:  Deferred AXIS III:   Past Medical History  Diagnosis Date  . Back pain   . Obesity     History of  . Nicotine addiction   . Psychosis   . Hypertension   . OD (overdose of drug)     hospitalized for 11 days   . Bipolar 1 disorder   . Hypercholesteremia   . Migraine headache   . Lateral epicondylitis  of elbow     right  . Cancer    AXIS IV:  other psychosocial or environmental problems, problems related to social environment and problems with primary support group AXIS V:  61-70 mild symptoms  Plan Of Care/Follow-up recommendations:  Activity:  As tolerated Diet:  Regular  Is patient on multiple antipsychotic therapies at discharge:  No   Has Patient had three or more failed trials of antipsychotic monotherapy by history:  No  Recommended Plan for Multiple Antipsychotic Therapies: NA    Jonay Hitchcock,JANARDHAHA R. 08/12/2013, 1:51 PM

## 2013-08-12 NOTE — Progress Notes (Signed)
[facility] Adult Case Management Discharge Plan :  Will you be returning to the same living situation after discharge: Yes,  returning home At discharge, do you have transportation home?:Yes,  family will pick pt up Do you have the ability to pay for your medications:Yes,  provided pt with prescriptions.  Pt verbalizes ability to afford meds.  Release of information consent forms completed and in the chart;  Patient's signature needed at discharge.  Patient to Follow up at: Follow-up Information   Follow up with [facility] On 08/13/2013. (Appointment scheduled at 12:00 pm on this date with [facility] for intake assessment.  They will than schedule you for medication management and therapy.  )    Contact information:   [address], [place], [place] [address] Phone: [phone] Fax: [phone]      Patient denies SI/HI:   Yes,  denies SI/HI    Safety Planning and Suicide Prevention discussed:  Yes,  discussed with pt and pt's sister.  See suicide prevention education note.   Ane Payment 08/12/2013, 11:28 AM

## 2013-08-12 NOTE — Progress Notes (Signed)
Recreation Therapy Notes   Date: 02.23.2015 Time: 2:45pm Location: [address] Dayroom   Group Topic: Wellness  Goal Area(s) Addresses:  Patient will define components of whole wellness. Patient will verbalize benefit of whole wellness.  Behavioral Response: Engaged, Appropriate   Intervention: Mind Map  Activity: Patients were asked to identify and define dimensions of wellness - physical, mental, emotional, spiritual, leisure, financial, environmental, and intellectual. Patients were then asked to identify activities they can participate in to invest in each dimension of wellness.   Education: Wellness, Discharge Planning   Education Outcome: Acknowledges understanding   Clinical Observations/Feedback: Patient actively engaged in group activity, identifying and defining dimensions of wellness. Patient actively engaged in group discussion identifying positive emotions associated with investing in her wellness, patient additionally highlighted that each dimension of wellness is connected to one another.  Patient additionally identified possibility for positive change in her life if she invested in her wellness.   Laureen Ochs Marqui Formby, LRT/CTRS  Sharlyn Odonnel L 08/12/2013 9:59 AM

## 2013-08-14 ENCOUNTER — Ambulatory Visit: Payer: Medicare Other | Admitting: Gastroenterology

## 2013-08-14 NOTE — Progress Notes (Signed)
Patient Discharge Instructions:  After Visit Summary (AVS):   Faxed to:  08/14/13 Discharge Summary Note:   Faxed to:  08/14/13 Psychiatric Admission Assessment Note:   Faxed to:  08/14/13 Suicide Risk Assessment - Discharge Assessment:   Faxed to:  08/14/13 Faxed/Sent to the Next Level Care provider:  08/14/13 Faxed to [facility] @ [phone]  Patsey Berthold, 08/14/2013, 3:42 PM

## 2013-09-03 ENCOUNTER — Ambulatory Visit: Payer: Self-pay | Admitting: Gastroenterology

## 2013-09-23 ENCOUNTER — Other Ambulatory Visit (HOSPITAL_COMMUNITY): Payer: Self-pay | Admitting: Family Medicine

## 2013-09-23 DIAGNOSIS — Z09 Encounter for follow-up examination after completed treatment for conditions other than malignant neoplasm: Secondary | ICD-10-CM

## 2013-09-23 DIAGNOSIS — N63 Unspecified lump in unspecified breast: Secondary | ICD-10-CM

## 2013-09-24 ENCOUNTER — Encounter (HOSPITAL_COMMUNITY): Payer: Self-pay | Admitting: Emergency Medicine

## 2013-09-24 ENCOUNTER — Emergency Department (HOSPITAL_COMMUNITY)
Admission: EM | Admit: 2013-09-24 | Discharge: 2013-09-24 | Disposition: A | Discharge status: Home / Self Care | Payer: Medicare Other | Attending: Emergency Medicine | Admitting: Emergency Medicine

## 2013-09-24 ENCOUNTER — Emergency Department (HOSPITAL_COMMUNITY): Payer: Medicare Other

## 2013-09-24 DIAGNOSIS — R1032 Left lower quadrant pain: Secondary | ICD-10-CM | POA: Insufficient documentation

## 2013-09-24 DIAGNOSIS — E7800 Pure hypercholesterolemia, unspecified: Secondary | ICD-10-CM | POA: Insufficient documentation

## 2013-09-24 DIAGNOSIS — Z85038 Personal history of other malignant neoplasm of large intestine: Secondary | ICD-10-CM | POA: Insufficient documentation

## 2013-09-24 DIAGNOSIS — E669 Obesity, unspecified: Secondary | ICD-10-CM | POA: Insufficient documentation

## 2013-09-24 DIAGNOSIS — F319 Bipolar disorder, unspecified: Secondary | ICD-10-CM | POA: Insufficient documentation

## 2013-09-24 DIAGNOSIS — G43909 Migraine, unspecified, not intractable, without status migrainosus: Secondary | ICD-10-CM | POA: Insufficient documentation

## 2013-09-24 DIAGNOSIS — R109 Unspecified abdominal pain: Secondary | ICD-10-CM

## 2013-09-24 DIAGNOSIS — Z90710 Acquired absence of both cervix and uterus: Secondary | ICD-10-CM | POA: Insufficient documentation

## 2013-09-24 DIAGNOSIS — F17200 Nicotine dependence, unspecified, uncomplicated: Secondary | ICD-10-CM | POA: Insufficient documentation

## 2013-09-24 DIAGNOSIS — Z9889 Other specified postprocedural states: Secondary | ICD-10-CM | POA: Insufficient documentation

## 2013-09-24 DIAGNOSIS — F29 Unspecified psychosis not due to a substance or known physiological condition: Secondary | ICD-10-CM | POA: Insufficient documentation

## 2013-09-24 DIAGNOSIS — Z8739 Personal history of other diseases of the musculoskeletal system and connective tissue: Secondary | ICD-10-CM | POA: Insufficient documentation

## 2013-09-24 DIAGNOSIS — Z79899 Other long term (current) drug therapy: Secondary | ICD-10-CM | POA: Insufficient documentation

## 2013-09-24 DIAGNOSIS — I10 Essential (primary) hypertension: Secondary | ICD-10-CM | POA: Insufficient documentation

## 2013-09-24 LAB — URINALYSIS, ROUTINE W REFLEX MICROSCOPIC
BILIRUBIN URINE: NEGATIVE
GLUCOSE, UA: NEGATIVE mg/dL
HGB URINE DIPSTICK: NEGATIVE
KETONES UR: NEGATIVE mg/dL
Nitrite: NEGATIVE
PH: 6.5 (ref 5.0–8.0)
Protein, ur: NEGATIVE mg/dL
Specific Gravity, Urine: 1.01 (ref 1.005–1.030)
Urobilinogen, UA: 0.2 mg/dL (ref 0.0–1.0)

## 2013-09-24 LAB — URINE MICROSCOPIC-ADD ON

## 2013-09-24 LAB — CBC
HCT: 35.2 % — ABNORMAL LOW (ref 36.0–46.0)
Hemoglobin: 12.2 g/dL (ref 12.0–15.0)
MCH: 30.4 pg (ref 26.0–34.0)
MCHC: 34.7 g/dL (ref 30.0–36.0)
MCV: 87.8 fL (ref 78.0–100.0)
Platelets: 199 10*3/uL (ref 150–400)
RBC: 4.01 MIL/uL (ref 3.87–5.11)
RDW: 14.7 % (ref 11.5–15.5)
WBC: 4.5 10*3/uL (ref 4.0–10.5)

## 2013-09-24 LAB — COMPREHENSIVE METABOLIC PANEL
ALT: 12 U/L (ref 0–35)
AST: 15 U/L (ref 0–37)
Albumin: 3.7 g/dL (ref 3.5–5.2)
Alkaline Phosphatase: 99 U/L (ref 39–117)
BUN: 12 mg/dL (ref 6–23)
CHLORIDE: 106 meq/L (ref 96–112)
CO2: 26 mEq/L (ref 19–32)
Calcium: 9.4 mg/dL (ref 8.4–10.5)
Creatinine, Ser: 0.7 mg/dL (ref 0.50–1.10)
GFR calc Af Amer: >90 mL/min (ref >90)
GFR calc non Af Amer: >90 mL/min (ref >90)
Glucose, Bld: 94 mg/dL (ref 70–99)
Potassium: 3.7 mEq/L (ref 3.7–5.3)
Sodium: 143 mEq/L (ref 137–147)
Total Bilirubin: <0.2 mg/dL — ABNORMAL LOW (ref 0.3–1.2)
Total Protein: 7.4 g/dL (ref 6.0–8.3)

## 2013-09-24 LAB — LIPASE, BLOOD: Lipase: 24 U/L (ref 11–59)

## 2013-09-24 MED ORDER — ONDANSETRON HCL 4 MG/2ML IJ SOLN
4.0000 mg | INTRAMUSCULAR | Status: DC | PRN
  Administered 2013-09-24: 4 mg via INTRAVENOUS
  Filled 2013-09-24: qty 2

## 2013-09-24 MED ORDER — OXYCODONE-ACETAMINOPHEN 5-325 MG PO TABS: ORAL_TABLET | ORAL | Status: DC

## 2013-09-24 MED ORDER — SODIUM CHLORIDE 0.9 % IV SOLN
INTRAVENOUS | Status: DC
  Administered 2013-09-24: 20:00:00 via INTRAVENOUS

## 2013-09-24 MED ORDER — DICYCLOMINE HCL 20 MG PO TABS: 20.0000 mg | ORAL_TABLET | Freq: Four times a day (QID) | ORAL | Status: DC | PRN

## 2013-09-24 MED ORDER — IOHEXOL 300 MG/ML  SOLN
100.0000 mL | Freq: Once | INTRAMUSCULAR | Status: AC | PRN
  Administered 2013-09-24: 100 mL via INTRAVENOUS

## 2013-09-24 MED ORDER — MORPHINE SULFATE 4 MG/ML IJ SOLN
4.0000 mg | INTRAMUSCULAR | Status: DC | PRN
  Administered 2013-09-24: 4 mg via INTRAVENOUS
  Filled 2013-09-24: qty 1

## 2013-09-24 NOTE — ED Provider Notes (Signed)
CSN: [id number]     Arrival date & time 09/24/13  1833 History   First MD Initiated Contact with Patient 09/24/13 1950     Chief Complaint  Patient presents with  . Abdominal Pain     HPI Pt was seen at 1950.  Per pt, c/o gradual onset and persistence of constant LLQ abd "pain" for the past 2 months, worse over the past 2 days. Describes the abd pain as "sharp," "cramping," and "like when they told me I had colon cancer."  Denies N/V/D, no fevers, no back pain, no rash, no CP/SOB, no black or blood in stools. The symptoms have been associated with no other complaints. The patient has a significant history of similar symptoms previously, recently being evaluated for this complaint and multiple prior evals for same.      Past Medical History  Diagnosis Date  . Back pain   . Obesity     History of  . Nicotine addiction   . Psychosis   . Hypertension   . OD (overdose of drug)     hospitalized for 11 days   . Bipolar 1 disorder   . Hypercholesteremia   . Migraine headache   . Lateral epicondylitis  of elbow     right  . Cancer     Colon Cancer   Past Surgical History  Procedure Laterality Date  . Partial hysterectomy    . Breast biopsy  10/18/2011    Procedure: BREAST BIOPSY;  Surgeon: Jamesetta So, MD;  Location: [facility];  Service: General;  Laterality: Left;  . Esophagogastroduodenoscopy N/A 12/26/2012    ZJI:RCVELFY reflux esophagitis. Gastric and duodenal bulbar erosions-status post gastric biopsynegative H.pylori  . Abdominal hysterectomy    . Colonoscopy    . Colonoscopy N/A 05/29/2013    BOF:BPZWCHE mass most likely representing colorectal cancer S/ P biospy.Multiple colonic and rectal polyps removed/treated as described above. Colonic diverticulosis  . Colon resection N/A 06/23/2013    Procedure: HAND ASSISTED LAPAROSCOPIC PARTIAL COLECTOMY;  Surgeon: Jamesetta So, MD;  Location: [facility];  Service: General;  Laterality: N/A;   Family History  Problem Relation Age of Onset   . Anesthesia problems Neg Hx   . Hypotension Neg Hx   . Malignant hyperthermia Neg Hx   . Pseudochol deficiency Neg Hx   . Colon cancer Father     diagnosed in his 62s   History  Substance Use Topics  . Smoking status: Current Every Day Smoker -- 0.50 packs/day for 38 years    Types: Cigarettes  . Smokeless tobacco: Never Used  . Alcohol Use: No    Review of Systems ROS: Statement: All systems negative except as marked or noted in the HPI; Constitutional: Negative for fever and chills. ; ; Eyes: Negative for eye pain, redness and discharge. ; ; ENMT: Negative for ear pain, hoarseness, nasal congestion, sinus pressure and sore throat. ; ; Cardiovascular: Negative for chest pain, palpitations, diaphoresis, dyspnea and peripheral edema. ; ; Respiratory: Negative for cough, wheezing and stridor. ; ; Gastrointestinal: +abd pain. Negative for nausea, vomiting, diarrhea, blood in stool, hematemesis, jaundice and rectal bleeding. . ; ; Genitourinary: Negative for dysuria, flank pain and hematuria. ; ; Musculoskeletal: Negative for back pain and neck pain. Negative for swelling and trauma.; ; Skin: Negative for pruritus, rash, abrasions, blisters, bruising and skin lesion.; ; Neuro: Negative for headache, lightheadedness and neck stiffness. Negative for weakness, altered level of consciousness , altered mental status, extremity weakness, paresthesias, involuntary  movement, seizure and syncope.      Allergies  Review of patient's allergies indicates no known allergies.  Home Medications   Current Outpatient Rx  Name  Route  Sig  Dispense  Refill  . amLODipine-benazepril (LOTREL) 5-10 MG per capsule   Oral   Take 1 capsule by mouth daily.         . clonazePAM (KLONOPIN) 0.5 MG tablet   Oral   Take 1 tablet (0.5 mg total) by mouth 2 (two) times daily as needed for anxiety.   20 tablet   0   . dicyclomine (BENTYL) 20 MG tablet   Oral   Take 20 mg by mouth 2 (two) times daily as needed  for spasms.         Marland Kitchen doxepin (SINEQUAN) 25 MG capsule   Oral   Take 1 capsule (25 mg total) by mouth at bedtime as needed and may repeat dose one time if needed (insomnia).   30 capsule   0   . hydrochlorothiazide (MICROZIDE) 12.5 MG capsule   Oral   Take 12.5 mg by mouth daily.         Marland Kitchen ibuprofen (ADVIL,MOTRIN) 200 MG tablet   Oral   Take 400 mg by mouth every 8 (eight) hours as needed for fever, headache or moderate pain. Taking 2 tablets         . potassium chloride (K-DUR,KLOR-CON) 10 MEQ tablet   Oral   Take 1 tablet (10 mEq total) by mouth daily.   30 tablet   0   . risperiDONE (RISPERDAL) 0.5 MG tablet   Oral   Take 1 tablet (0.5 mg total) by mouth 2 (two) times daily.   60 tablet   0   . sertraline (ZOLOFT) 25 MG tablet   Oral   Take 1 tablet (25 mg total) by mouth daily.   30 tablet   0    BP 130/89  Pulse 80  Temp(Src) 98.2 F (36.8 C) (Oral)  Resp 18  Ht 5\' 6"  (1.676 m)  Wt 183 lb (83.008 kg)  BMI 29.55 kg/m2  SpO2 99% Physical Exam 1955: Physical examination:  Nursing notes reviewed; Vital signs and O2 SAT reviewed;  Constitutional: Well developed, Well nourished, Well hydrated, In no acute distress; Head:  Normocephalic, atraumatic; Eyes: EOMI, PERRL, No scleral icterus; ENMT: Mouth and pharynx normal, Mucous membranes moist; Neck: Supple, Full range of motion, No lymphadenopathy; Cardiovascular: Regular rate and rhythm, No murmur, rub, or gallop; Respiratory: Breath sounds clear & equal bilaterally, No rales, rhonchi, wheezes.  Speaking full sentences with ease, Normal respiratory effort/excursion; Chest: Nontender, Movement normal; Abdomen: Soft, +LLQ tenderness to palp. Nondistended, Normal bowel sounds. Rectal exam performed w/permission of pt and ED RN chaperone present.  Anal tone normal.  Non-tender, soft brown stool in rectal vault, heme neg.  No fecal impaction, no fissures, no external hemorrhoids, no palp masses.; Genitourinary: No CVA  tenderness; Extremities: Pulses normal, No tenderness, No edema, No calf edema or asymmetry.; Neuro: AA&Ox3, Major CN grossly intact.  Speech clear. No gross focal motor or sensory deficits in extremities.; Skin: Color normal, Warm, Dry.   ED Course  Procedures    EKG Interpretation None      MDM  MDM Reviewed: previous chart, nursing note and vitals Reviewed previous: labs, x-ray and CT scan Interpretation: labs, x-ray and CT scan    Results for orders placed during the hospital encounter of 09/24/13  URINALYSIS, ROUTINE W REFLEX MICROSCOPIC  Result Value Ref Range   Color, Urine STRAW (*) YELLOW   APPearance CLEAR  CLEAR   Specific Gravity, Urine 1.010  1.005 - 1.030   pH 6.5  5.0 - 8.0   Glucose, UA NEGATIVE  NEGATIVE mg/dL   Hgb urine dipstick NEGATIVE  NEGATIVE   Bilirubin Urine NEGATIVE  NEGATIVE   Ketones, ur NEGATIVE  NEGATIVE mg/dL   Protein, ur NEGATIVE  NEGATIVE mg/dL   Urobilinogen, UA 0.2  0.0 - 1.0 mg/dL   Nitrite NEGATIVE  NEGATIVE   Leukocytes, UA TRACE (*) NEGATIVE  COMPREHENSIVE METABOLIC PANEL      Result Value Ref Range   Sodium 143  137 - 147 mEq/L   Potassium 3.7  3.7 - 5.3 mEq/L   Chloride 106  96 - 112 mEq/L   CO2 26  19 - 32 mEq/L   Glucose, Bld 94  70 - 99 mg/dL   BUN 12  6 - 23 mg/dL   Creatinine, Ser 0.70  0.50 - 1.10 mg/dL   Calcium 9.4  8.4 - 10.5 mg/dL   Total Protein 7.4  6.0 - 8.3 g/dL   Albumin 3.7  3.5 - 5.2 g/dL   AST 15  0 - 37 U/L   ALT 12  0 - 35 U/L   Alkaline Phosphatase 99  39 - 117 U/L   Total Bilirubin <0.2 (*) 0.3 - 1.2 mg/dL   GFR calc non Af Amer >90  >90 mL/min   GFR calc Af Amer >90  >90 mL/min  CBC      Result Value Ref Range   WBC 4.5  4.0 - 10.5 K/uL   RBC 4.01  3.87 - 5.11 MIL/uL   Hemoglobin 12.2  12.0 - 15.0 g/dL   HCT 35.2 (*) 36.0 - 46.0 %   MCV 87.8  78.0 - 100.0 fL   MCH 30.4  26.0 - 34.0 pg   MCHC 34.7  30.0 - 36.0 g/dL   RDW 14.7  11.5 - 15.5 %   Platelets 199  150 - 400 K/uL  LIPASE,  BLOOD      Result Value Ref Range   Lipase 24  11 - 59 U/L  URINE MICROSCOPIC-ADD ON      Result Value Ref Range   Squamous Epithelial / LPF FEW (*) RARE   WBC, UA 0-2  <3 WBC/hpf   Bacteria, UA RARE  RARE   Dg Chest 2 View 09/24/2013   CLINICAL DATA:  Abdominal pain  EXAM: CHEST  2 VIEW  COMPARISON:  Prior radiograph from 06/17/2013  FINDINGS: The cardiac and mediastinal silhouettes are stable in size and contour, and remain within normal limits.  The lungs are normally inflated. No airspace consolidation, pleural effusion, or pulmonary edema is identified. There is no pneumothorax.  No acute osseous abnormality identified.  IMPRESSION: No acute cardiopulmonary abnormality.   Electronically Signed   By: Jeannine Boga M.D.   On: 09/24/2013 20:56   Ct Abdomen Pelvis W Contrast 09/24/2013   CLINICAL DATA:  Abdominal and rectal pain 2 days. History of colon cancer December 2014 with resection January 2015.  EXAM: CT ABDOMEN AND PELVIS WITH CONTRAST  TECHNIQUE: Multidetector CT imaging of the abdomen and pelvis was performed using the standard protocol following bolus administration of intravenous contrast.  CONTRAST:  179mL OMNIPAQUE IOHEXOL 300 MG/ML  SOLN  COMPARISON:  07/29/2013 and 05/30/2013  FINDINGS: Lung bases are within normal.  Abdominal images demonstrate several small well-defined liver hypodensities unchanged compatible cysts  with the largest over the right lobe measuring 2.6 cm. The spleen, pancreas, adrenal glands and gallbladder are within normal. Kidneys are normal in size without evidence of hydronephrosis or nephrolithiasis. There is minimal calcified plaque over the abdominal aorta and iliac vessels. The appendix is normal.  Pelvic images demonstrate a surgical suture line over the rectosigmoid junction. 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 Remaining pelvic structures are unremarkable. There are minimal degenerative changes of the spine and hips.  IMPRESSION: No acute findings in  the abdomen/ pelvis.  Postsurgical change over the rectosigmoid colon compatible prior partial colectomy.  Several stable small liver hypodensities likely cysts.   Electronically Signed   By: Marin Olp M.D.   On: 09/24/2013 21:12    2120:  Pt has had several ED evaluations for same pain. Workup today is reassuring. No N/V or stooling while in the ED. Has tol PO well. Pt states she is ready to go home now. Dx and testing d/w pt and family.  Questions answered.  Verb understanding, agreeable to d/c home with outpt f/u.   Alfonzo Feller, DO 09/26/13 [phone]

## 2013-09-24 NOTE — ED Notes (Signed)
POC occult blood stool negative.

## 2013-09-24 NOTE — ED Notes (Addendum)
Lower abdominal pain which began 2 days ago but has gotten worse today. Denies any other symptoms.Pt also states rectal pain. Hx of colon cancer.

## 2013-09-24 NOTE — Discharge Instructions (Signed)
°Emergency Department Resource Guide °1) Find a Doctor and Pay Out of Pocket °Although you won't have to find out who is covered by your insurance plan, it is a good idea to ask around and get recommendations. You will then need to call the office and see if the doctor you have chosen will accept you as a new patient and what types of options they offer for patients who are self-pay. Some doctors offer discounts or will set up payment plans for their patients who do not have insurance, but you will need to ask so you aren't surprised when you get to your appointment. ° °2) Contact Your Local Health Department °Not all health departments have doctors that can see patients for sick visits, but many do, so it is worth a call to see if yours does. If you don't know where your local health department is, you can check in your phone book. The CDC also has a tool to help you locate your state's health department, and many state websites also have listings of all of their local health departments. ° °3) Find a Walk-in Clinic °If your illness is not likely to be very severe or complicated, you may want to try a walk in clinic. These are popping up all over the country in pharmacies, drugstores, and shopping centers. They're usually staffed by nurse practitioners or physician assistants that have been trained to treat common illnesses and complaints. They're usually fairly quick and inexpensive. However, if you have serious medical issues or chronic medical problems, these are probably not your best option. ° °No Primary Care Doctor: °- Call [facility] at  832-8000 - they can help you locate a primary care doctor that  accepts your insurance, provides certain services, etc. °- Physician Referral Service- 1-800-533-3463 ° °Chronic Pain Problems: °Organization         Address  Phone   Notes  °[facility]  (336) 297-2271 Patients need to be referred by their primary care doctor.  ° °Medication  Assistance: °Organization         Address  Phone   Notes  °[place] [facility] [address] °[place], [place] [address] (336) 641-8030 --Must be a resident of [place] °-- Must have NO insurance coverage whatsoever (no Medicaid/ Medicare, etc.) °-- The pt. MUST have a primary care doctor that directs their care regularly and follows them in the community °  °[facility]  (866) 331-1348   °[employer]  (888) 892-1162   ° °Agencies that provide inexpensive medical care: °Organization         Address  Phone   Notes  °[facility]  (336) 832-8035   °[facility]    (336) 832-7272   °[facility] [address] °[place], [place] [address] (336) 832-4777   °[facility] [address], °[place] (336) 271-4999   °[facility]    (336) 373-0678   °[facility]    (336) 272-1050   °[facility] ° [address], [place] Phone:  (336) 832-4444, Fax:  (336) 832-4440 Hours of Operation:  9 am - 6 pm, M-F.  Also accepts Medicaid/Medicare and self-pay.  °[facility] ° [address], [place] Phone: (336) 832-3150, Fax: (336) 832-3151. Hours of Operation:  8:30 am - 5:30 pm, M-F.  Also accepts Medicaid and self-pay.  °[facility] [place] [address], [place] Phone: (336) 878-6027   °[facility] [address], [place], [place] (336)723-1848, Ext. 123 Mondays & Thursdays: 7-9 AM.  First 15 patients are seen on a first come, first serve basis. °  ° °Medicaid-accepting [place] Providers: ° °Organization         Address  Phone   Notes  °[facility] [address], [place] (336) 641-2100 Also accepts self-pay patients.  °[facility] [address], [place] ° (336) 856-9996   °[facility] [address], [place]  (336) 288-8857   °[facility] [address] [place] [address], [place] (336) 299-7000   °Veita Bland [address], [place]  ° (336) 373-1557 Only accepts [place] Access Medicaid patients after they have their name applied to their card.  ° °Self-Pay (no insurance) in [place]: ° °Organization         Address  Phone   Notes  °Sickle Cell Patients, [facility] [address], [place] (336) 832-1970   °[facility] [address], [place] (336) 832-4400   °[facility] [place] ° [address], [place] (336) 992-4800   °[facility]/Dr. Osei-Bonsu ° [address] [place] [address], [place] or [address], [place] (336) [phone] Phone number for both [place] and [place] locations is the same.  °[facility] [address], [place] (336) 299-0000   °[facility] [address] [place] [address], [place] or [address] (336) 852-7530 °(336) 878-2260   °[facility] [address], [place] (336) 350-1642, phone; (336) 294-5005, fax Sees patients 1st and 3rd Saturday of every month.  Must not qualify for public or private insurance (i.e. Medicaid, Medicare, [place] Health Choice, Veterans' Benefits) • Household income should be no more than 200% of the poverty level •The clinic cannot treat you if you are pregnant or think you are pregnant • Sexually transmitted diseases are not treated at the clinic.  ° ° °Dental Care: °Organization         Address  Phone  Notes  °[place] [facility] [address],  (336) 641-6152 Accepts children up to age 21 who are enrolled in Medicaid or [place] Health Choice; pregnant women with a Medicaid card; and children who have applied for Medicaid or [place] Health Choice, but were declined, whose parents can pay a reduced fee at time of service.  °[place]  [facility] [place]  [address], [place] (336) 641-7733 Accepts children up to age 21 who are enrolled in Medicaid or [place] Health Choice; pregnant women with a Medicaid card; and children who have applied for Medicaid or [place] Health Choice, but were declined, whose parents can pay a reduced fee at time of service.  °[facility] ° [address],  (336) 641-4533 Patients are seen by appointment only. Walk-ins are not accepted. [facility] will see patients 18 years of age and older. °Monday - Tuesday (8am-5pm) °Most Wednesdays (8:30-5pm) °$30 per visit, cash only  °[facility] ° [address], [place] (336) 641-4533 Patients are seen by appointment only. Walk-ins are not accepted. [facility] will see patients 18 years of age and older. °One   Wednesday Evening (Monthly: Volunteer Based).  $30 per visit, cash only  °[facility]  (919) 537-3737 for adults; Children under age 4, call Graduate Pediatric Dentistry at ([phone]) [phone]. Children aged 4-14, please call ([phone]) [phone] to request a pediatric application. ° Dental services are provided in all areas of dental care including fillings, crowns and bridges, complete and partial dentures, implants, gum treatment, root canals, and extractions. Preventive care is also provided. Treatment is provided to both adults and children. °Patients are selected via a lottery and there is often a waiting list. °  °[facility] [address], °[place] ° (336) 763-8833 [email or web address] °  °[facility] [address], [place], [place] (336)723-1848, Ext. 123 Second and Fourth Thursday of each month, opens at 6:30 AM; Clinic ends at 9 AM.  Patients are seen on a first-come first-served basis, and a limited number are seen during each clinic.  ° °[facility] ° [address], [place], [place] (336) [phone]    Eligibility Requirements °You must have lived in [place], [place], or [place] counties for at least the last three months. °  You cannot be eligible for state or federal sponsored healthcare insurance, including Veterans Administration, Medicaid, or Medicare. °  You generally cannot be eligible for healthcare insurance through your employer.  °  How to apply: °Eligibility screenings are held every Tuesday and Wednesday afternoon from 1:00 pm until 4:00 pm. You do not need an appointment for the interview!  °[facility] [address], [place], [place] 336-631-2330   °[facility]  336-342-8273   °[facility]  336-703-3100   °[facility]  336-570-6415   ° °Behavioral Health Resources in the Community: °Intensive Outpatient Programs °Organization         Address  Phone  Notes  °[facility] [address], [place], [place] 336-878-6098   °[facility] [address], [place], [place] 336-832-9800   °[facility] [address], [place], [place] ° 336-882-2125   °[facility] [address],  °[place], [place] 1-800-853-5163 or 336-641-4981   °Substance Abuse Resources °Organization         Address  Phone  Notes  °[facility]  336-882-2125   °[facility]  336-784-9470   °[facility]  336-285-9073   °[facility]  336-845-3988   °Residential & Outpatient Substance Abuse Program  1-800-659-3381   °Psychological Services °Organization         Address  Phone  Notes  °[facility]  336- 832-9600   °[facility]  336- 378-7881   °[facility] [address], [place] 1-800-853-5163 or 336-641-4981   ° °Mobile Crisis Teams °Organization         Address  Phone  Notes  °[facility], Mobile Crisis Care Unit  1-877-626-1772   °Assertive °[facility] ° [address]  [place], [place] 336-834-9664   °Sharon DeEsch [address] °[place] 336-554-5454   ° °Self-Help/Support Groups °Organization         Address  Phone             Notes  °[facility] [place] - variety of support groups  336- 373-1402 Call for more information  °Narcotics Anonymous (NA), [facility] [address], °[place]   2 meetings at this location  ° °  Residential Treatment Programs Organization         Address  Phone  Notes  [facility] [address]    [place]  1-323-456-8969   [facility]  [address] [place] [address]34, [place], [place]   [place], [place] [phone] Admissions: 8am-3pm M-F  [facility] [address]    [place], [place] [phone]   [facility] [address] [place], [place], [place]   [facility] [address],  [place]   [facility] Intensive Outpatient [address], [place], [place]   [facility] ([place]  [place], [place] 1-509 237 1211 or 2256578921   [facility] [address], [place], [place] Accepts Medicaid  [facility] [address]  [place] 1-814-018-4312 Substance Abuse/Addiction Treatment   [facility] Organization         Address  Phone  Notes  [facility]  (443)113-4361   Domenic Schwab, PhD [address] [place], [place]   7706606383 or [phone]   [facility] [place], [place] 501-597-9931   [facility] [address] [place], [place] [phone] Insurance/Medicaid/sponsorship through [facility] [address]                                    [place], [place] [phone] [place] [address], [place] [phone]    Dr. Adele Schilder  ([phone]   [facility]. 1) [address] [place] 2) [place] 3)  [address], [place] [phone]  [phone]   [place] [phone] or [phone] (After Hours)      Take the prescriptions as directed.  Call your regular GI doctor tomorrow to schedule a follow up appointment within the next 2 days.  Return to the Emergency Department immediately sooner if worsening.

## 2013-09-30 ENCOUNTER — Ambulatory Visit (INDEPENDENT_AMBULATORY_CARE_PROVIDER_SITE_OTHER): Payer: Medicare Other | Admitting: Gastroenterology

## 2013-09-30 ENCOUNTER — Encounter: Payer: Self-pay | Admitting: Gastroenterology

## 2013-09-30 VITALS — BP 140/88 | HR 112 | Temp 97.8°F | Ht 66.0 in | Wt 180.4 lb

## 2013-09-30 DIAGNOSIS — R1030 Lower abdominal pain, unspecified: Secondary | ICD-10-CM | POA: Insufficient documentation

## 2013-09-30 DIAGNOSIS — R109 Unspecified abdominal pain: Secondary | ICD-10-CM

## 2013-09-30 DIAGNOSIS — C189 Malignant neoplasm of colon, unspecified: Secondary | ICD-10-CM

## 2013-09-30 NOTE — Progress Notes (Signed)
Primary Care Physician: Yevette Edwards, NP  Primary Gastroenterologist:  Garfield Cornea, MD   Chief Complaint  Patient presents with  . Abdominal Pain    lower abd    HPI: Ashley Patrick is a 58 y.o. female here for further evaluation of lower abdominal pain. She had history of stage I moderately differentiated adenocarcinoma sigmoid colon, status post sigmoid colectomy with 12 notes negative, no evidence of disease. Diagnosed at time of colonoscopy in December 2014. Partial colectomy in January 2015. Colonoscopy in one-year recommended.  Chronically has intermittent abdominal pain. Mid-lower abdominal pain. Pain usually begins with activities, throbbing pain. Nothing seems to help pain. Not affected by BMs. Pain comes and go. Had been good for several days, this morning some abdominal pain. No N/V. No heartburn. Weight increasing. Good appetite. No dysuria. Used Bentyl and percocet twice since ER visit on April 8. BM every day, soft stool. Pain intermittent with episodes of no pain in between. Last for several days. Can start out mild and end up severe. Reports having this pain prior to her partial colectomy as well.  Current Outpatient Prescriptions  Medication Sig Dispense Refill  . amLODipine-benazepril (LOTREL) 5-10 MG per capsule Take 1 capsule by mouth daily.      Marland Kitchen dicyclomine (BENTYL) 20 MG tablet Take 1 tablet (20 mg total) by mouth every 6 (six) hours as needed for spasms (abdominal cramping).  15 tablet  0  . hydrochlorothiazide (MICROZIDE) 12.5 MG capsule Take 12.5 mg by mouth daily.      Marland Kitchen ibuprofen (ADVIL,MOTRIN) 200 MG tablet Take 400 mg by mouth every 8 (eight) hours as needed for fever, headache or moderate pain. Taking 2 tablets      . oxyCODONE-acetaminophen (PERCOCET/ROXICET) 5-325 MG per tablet 1 or 2 tabs PO q6h prn pain  10 tablet  0  . potassium chloride (K-DUR,KLOR-CON) 10 MEQ tablet Take 1 tablet (10 mEq total) by mouth daily.  30 tablet  0  . risperiDONE  (RISPERDAL) 0.5 MG tablet Take 1 tablet (0.5 mg total) by mouth 2 (two) times daily.  60 tablet  0  . sertraline (ZOLOFT) 25 MG tablet Take 1 tablet (25 mg total) by mouth daily.  30 tablet  0   No current facility-administered medications for this visit.    Allergies as of 09/30/2013  . (No Known Allergies)   Past Medical History  Diagnosis Date  . Back pain   . Obesity     History of  . Nicotine addiction   . Psychosis   . Hypertension   . OD (overdose of drug)     hospitalized for 11 days   . Bipolar 1 disorder   . Hypercholesteremia   . Migraine headache   . Lateral epicondylitis  of elbow     right  . Cancer     Colon Cancer   Past Surgical History  Procedure Laterality Date  . Partial hysterectomy    . Breast biopsy  10/18/2011    Procedure: BREAST BIOPSY;  Surgeon: Jamesetta So, MD;  Location: [facility];  Service: General;  Laterality: Left;  . Esophagogastroduodenoscopy N/A 12/26/2012    NID:[id number] reflux esophagitis. Gastric and duodenal bulbar erosions-status post gastric biopsynegative H.pylori  . Abdominal hysterectomy    . Colonoscopy    . Colonoscopy N/A 05/29/2013    TIR:WERXVQM mass most likely representing colorectal cancer S/ P biospy.Multiple colonic and rectal polyps removed/treated as described above. Colonic diverticulosis  . Colon resection N/A  06/23/2013    Procedure: HAND ASSISTED LAPAROSCOPIC PARTIAL COLECTOMY;  Surgeon: Jamesetta So, MD;  Location: [facility];  Service: General;  Laterality: N/A;    ROS:  General: Negative for anorexia, weight loss, fever, chills, fatigue, weakness. ENT: Negative for hoarseness, difficulty swallowing , nasal congestion. CV: Negative for chest pain, angina, palpitations, dyspnea on exertion, peripheral edema.  Respiratory: Negative for dyspnea at rest, dyspnea on exertion, cough, sputum, wheezing.  GI: See history of present illness. GU:  Negative for dysuria, hematuria, urinary incontinence, urinary frequency,  nocturnal urination.  Endo: Negative for unusual weight change.    Physical Examination:   BP 140/88  Pulse 112  Temp(Src) 97.8 F (36.6 C) (Oral)  Ht 5\' 6"  (1.676 m)  Wt 180 lb 6.4 oz (81.829 kg)  BMI 29.13 kg/m2  General: Well-nourished, well-developed in no acute distress.  Eyes: No icterus. Mouth: Oropharyngeal mucosa moist and pink , no lesions erythema or exudate. Lungs: Clear to auscultation bilaterally.  Heart: Regular rate and rhythm, no murmurs rubs or gallops.  Abdomen: Bowel sounds are normal, mid abdominal tenderness, well-healed midline incision, nondistended, no hepatosplenomegaly or masses, no abdominal bruits or hernia , no rebound or guarding.   Extremities: No lower extremity edema. No clubbing or deformities. Neuro: Alert and oriented x 4   Skin: Warm and dry, no jaundice.   Psych: Alert and cooperative, normal mood and affect.  Labs:  Lab Results  Component Value Date   CREATININE 0.70 09/24/2013   BUN 12 09/24/2013   NA 143 09/24/2013   K 3.7 09/24/2013   CL 106 09/24/2013   CO2 26 09/24/2013   Lab Results  Component Value Date   ALT 12 09/24/2013   AST 15 09/24/2013   ALKPHOS 99 09/24/2013   BILITOT <0.2* 09/24/2013   Lab Results  Component Value Date   WBC 4.5 09/24/2013   HGB 12.2 09/24/2013   HCT 35.2* 09/24/2013   MCV 87.8 09/24/2013   PLT 199 09/24/2013   Lab Results  Component Value Date   LIPASE 24 09/24/2013    Imaging Studies: Dg Chest 2 View  09/24/2013   CLINICAL DATA:  Abdominal pain  EXAM: CHEST  2 VIEW  COMPARISON:  Prior radiograph from 06/17/2013  FINDINGS: The cardiac and mediastinal silhouettes are stable in size and contour, and remain within normal limits.  The lungs are normally inflated. No airspace consolidation, pleural effusion, or pulmonary edema is identified. There is no pneumothorax.  No acute osseous abnormality identified.  IMPRESSION: No acute cardiopulmonary abnormality.   Electronically Signed   By: Jeannine Boga M.D.   On:  09/24/2013 20:56   Ct Abdomen Pelvis W Contrast  09/24/2013   CLINICAL DATA:  Abdominal and rectal pain 2 days. History of colon cancer December 2014 with resection January 2015.  EXAM: CT ABDOMEN AND PELVIS WITH CONTRAST  TECHNIQUE: Multidetector CT imaging of the abdomen and pelvis was performed using the standard protocol following bolus administration of intravenous contrast.  CONTRAST:  197mL OMNIPAQUE IOHEXOL 300 MG/ML  SOLN  COMPARISON:  07/29/2013 and 05/30/2013  FINDINGS: Lung bases are within normal.  Abdominal images demonstrate several small well-defined liver hypodensities unchanged compatible cysts with the largest over the right lobe measuring 2.6 cm. The spleen, pancreas, adrenal glands and gallbladder are within normal. Kidneys are normal in size without evidence of hydronephrosis or nephrolithiasis. There is minimal calcified plaque over the abdominal aorta and iliac vessels. The appendix is normal.  Pelvic images demonstrate a surgical  suture line over the rectosigmoid junction. 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 Remaining pelvic structures are unremarkable. There are minimal degenerative changes of the spine and hips.  IMPRESSION: No acute findings in the abdomen/ pelvis.  Postsurgical change over the rectosigmoid colon compatible prior partial colectomy.  Several stable small liver hypodensities likely cysts.   Electronically Signed   By: Marin Olp M.D.   On: 09/24/2013 21:12

## 2013-09-30 NOTE — Patient Instructions (Signed)
1. Avoid heavy lifting for now. Do not lift over 10-15 pounds. 2. I will review your CT scan with Dr. Gala Romney, further recommendations to follow. 3. Call if you're abdominal pain worsens.

## 2013-10-02 ENCOUNTER — Encounter: Payer: Self-pay | Admitting: Gastroenterology

## 2013-10-02 NOTE — Assessment & Plan Note (Signed)
58 year old lady who presents with acute on chronic lower abdominal pain/mid abdomen. Worse with activities. Not affected by bowel movements. Seems to be episodic with days without pain in between. Her appetite is good. Denies any bowel issues. Recent CT scan reassuring. Question adhesions. Discussed with patient today. No clear etiology for her pain. I will discuss further with Dr. Gala Romney and further recommendations to be made. Given history of colon cancer diagnosed in December 2014, she'll be due for surveillance colonoscopy this December.

## 2013-10-02 NOTE — Progress Notes (Signed)
cc'd to pcp 

## 2013-10-08 ENCOUNTER — Ambulatory Visit (HOSPITAL_COMMUNITY)
Admission: RE | Admit: 2013-10-08 | Discharge: 2013-10-08 | Disposition: A | Discharge status: Home / Self Care | Payer: Medicare Other | Source: Ambulatory Visit | Attending: Family Medicine | Admitting: Family Medicine

## 2013-10-08 ENCOUNTER — Encounter (HOSPITAL_COMMUNITY): Payer: Self-pay

## 2013-10-08 DIAGNOSIS — Z09 Encounter for follow-up examination after completed treatment for conditions other than malignant neoplasm: Secondary | ICD-10-CM

## 2013-10-08 DIAGNOSIS — R928 Other abnormal and inconclusive findings on diagnostic imaging of breast: Secondary | ICD-10-CM | POA: Insufficient documentation

## 2013-10-08 DIAGNOSIS — N63 Unspecified lump in unspecified breast: Secondary | ICD-10-CM

## 2013-10-08 NOTE — Progress Notes (Signed)
Please let patient know, I discussed her pain with Dr. Gala Romney. He has recommend she go back to see her surgeon, Dr. Arnoldo Morale given her fairly recent surgery with him.

## 2013-10-20 NOTE — Progress Notes (Signed)
Tried to call pt- NA 

## 2013-10-24 NOTE — Progress Notes (Signed)
Tried to call pt- NA 

## 2013-11-06 NOTE — Progress Notes (Signed)
LMOM to call back

## 2013-11-13 NOTE — Progress Notes (Signed)
Unable to reach pt- mailed letter with recommendations.

## 2014-01-15 ENCOUNTER — Encounter (HOSPITAL_COMMUNITY): Payer: Self-pay | Admitting: Emergency Medicine

## 2014-01-15 ENCOUNTER — Emergency Department (HOSPITAL_COMMUNITY): Payer: Medicare Other

## 2014-01-15 ENCOUNTER — Emergency Department (HOSPITAL_COMMUNITY)
Admission: EM | Admit: 2014-01-15 | Discharge: 2014-01-15 | Disposition: A | Discharge status: Home / Self Care | Payer: Medicare Other | Attending: Emergency Medicine | Admitting: Emergency Medicine

## 2014-01-15 DIAGNOSIS — R109 Unspecified abdominal pain: Secondary | ICD-10-CM | POA: Insufficient documentation

## 2014-01-15 DIAGNOSIS — F319 Bipolar disorder, unspecified: Secondary | ICD-10-CM | POA: Diagnosis not present

## 2014-01-15 DIAGNOSIS — Z85038 Personal history of other malignant neoplasm of large intestine: Secondary | ICD-10-CM | POA: Insufficient documentation

## 2014-01-15 DIAGNOSIS — G43909 Migraine, unspecified, not intractable, without status migrainosus: Secondary | ICD-10-CM | POA: Insufficient documentation

## 2014-01-15 DIAGNOSIS — M546 Pain in thoracic spine: Secondary | ICD-10-CM | POA: Diagnosis not present

## 2014-01-15 DIAGNOSIS — I10 Essential (primary) hypertension: Secondary | ICD-10-CM | POA: Diagnosis not present

## 2014-01-15 DIAGNOSIS — Z8739 Personal history of other diseases of the musculoskeletal system and connective tissue: Secondary | ICD-10-CM | POA: Insufficient documentation

## 2014-01-15 DIAGNOSIS — Z79899 Other long term (current) drug therapy: Secondary | ICD-10-CM | POA: Diagnosis not present

## 2014-01-15 DIAGNOSIS — M549 Dorsalgia, unspecified: Secondary | ICD-10-CM | POA: Insufficient documentation

## 2014-01-15 DIAGNOSIS — F17200 Nicotine dependence, unspecified, uncomplicated: Secondary | ICD-10-CM | POA: Insufficient documentation

## 2014-01-15 DIAGNOSIS — E669 Obesity, unspecified: Secondary | ICD-10-CM | POA: Insufficient documentation

## 2014-01-15 LAB — URINALYSIS, ROUTINE W REFLEX MICROSCOPIC
Bilirubin Urine: NEGATIVE
GLUCOSE, UA: NEGATIVE mg/dL
Hgb urine dipstick: NEGATIVE
Ketones, ur: NEGATIVE mg/dL
LEUKOCYTES UA: NEGATIVE
NITRITE: NEGATIVE
PH: 5.5 (ref 5.0–8.0)
PROTEIN: NEGATIVE mg/dL
Specific Gravity, Urine: 1.02 (ref 1.005–1.030)
Urobilinogen, UA: 0.2 mg/dL (ref 0.0–1.0)

## 2014-01-15 LAB — COMPREHENSIVE METABOLIC PANEL
ALBUMIN: 4.3 g/dL (ref 3.5–5.2)
ALT: 14 U/L (ref 0–35)
AST: 15 U/L (ref 0–37)
Alkaline Phosphatase: 103 U/L (ref 39–117)
Anion gap: 14 (ref 5–15)
BUN: 11 mg/dL (ref 6–23)
CALCIUM: 10.1 mg/dL (ref 8.4–10.5)
CO2: 25 mEq/L (ref 19–32)
CREATININE: 0.8 mg/dL (ref 0.50–1.10)
Chloride: 104 mEq/L (ref 96–112)
GFR calc Af Amer: >90 mL/min (ref >90)
GFR calc non Af Amer: 81 mL/min — ABNORMAL LOW (ref >90)
Glucose, Bld: 95 mg/dL (ref 70–99)
Potassium: 3.5 mEq/L — ABNORMAL LOW (ref 3.7–5.3)
Sodium: 143 mEq/L (ref 137–147)
Total Bilirubin: 0.2 mg/dL — ABNORMAL LOW (ref 0.3–1.2)
Total Protein: 8.2 g/dL (ref 6.0–8.3)

## 2014-01-15 LAB — CBC WITH DIFFERENTIAL/PLATELET
BASOS ABS: 0 10*3/uL (ref 0.0–0.1)
BASOS PCT: 0 % (ref 0–1)
EOS ABS: 0.1 10*3/uL (ref 0.0–0.7)
EOS PCT: 2 % (ref 0–5)
HEMATOCRIT: 41.6 % (ref 36.0–46.0)
Hemoglobin: 14.4 g/dL (ref 12.0–15.0)
Lymphocytes Relative: 30 % (ref 12–46)
Lymphs Abs: 1.7 10*3/uL (ref 0.7–4.0)
MCH: 30.3 pg (ref 26.0–34.0)
MCHC: 34.6 g/dL (ref 30.0–36.0)
MCV: 87.4 fL (ref 78.0–100.0)
MONO ABS: 0.3 10*3/uL (ref 0.1–1.0)
Monocytes Relative: 5 % (ref 3–12)
Neutro Abs: 3.7 10*3/uL (ref 1.7–7.7)
Neutrophils Relative %: 63 % (ref 43–77)
Platelets: 225 10*3/uL (ref 150–400)
RBC: 4.76 MIL/uL (ref 3.87–5.11)
RDW: 14.8 % (ref 11.5–15.5)
WBC: 5.8 10*3/uL (ref 4.0–10.5)

## 2014-01-15 MED ORDER — OXYCODONE-ACETAMINOPHEN 5-325 MG PO TABS: 1.0000 | ORAL_TABLET | Freq: Four times a day (QID) | ORAL | Status: DC | PRN

## 2014-01-15 MED ORDER — HYDROMORPHONE HCL PF 1 MG/ML IJ SOLN
1.0000 mg | Freq: Once | INTRAMUSCULAR | Status: AC
  Administered 2014-01-15: 1 mg via INTRAVENOUS
  Filled 2014-01-15: qty 1

## 2014-01-15 MED ORDER — ONDANSETRON HCL 4 MG/2ML IJ SOLN
4.0000 mg | Freq: Once | INTRAMUSCULAR | Status: AC
  Administered 2014-01-15: 4 mg via INTRAVENOUS
  Filled 2014-01-15: qty 2

## 2014-01-15 MED ORDER — CYCLOBENZAPRINE HCL 10 MG PO TABS: 10.0000 mg | ORAL_TABLET | Freq: Three times a day (TID) | ORAL | Status: DC

## 2014-01-15 NOTE — ED Notes (Signed)
Pain lt flank and up into post thorax,  Started on MOnday.  No known injury Pain with deep insp.  No fever.  INcreased pain with movement.

## 2014-01-15 NOTE — ED Notes (Signed)
Pt vomiting after giving pain medicine.

## 2014-01-15 NOTE — ED Notes (Signed)
Pt no longer vomiting ,  States her nausea and pain are  Better.

## 2014-01-15 NOTE — Discharge Instructions (Signed)
Follow up with your md next week. °

## 2014-01-15 NOTE — ED Provider Notes (Signed)
CSN: [id number]     Arrival date & time 01/15/14  1552 History   First MD Initiated Contact with Patient 01/15/14 1614     Chief Complaint  Patient presents with  . Flank Pain     (Consider location/radiation/quality/duration/timing/severity/associated sxs/prior Treatment) Patient is a 58 y.o. female presenting with flank pain. The history is provided by the patient (the pt complains of back pain).  Flank Pain This is a new problem. The current episode started 12 to 24 hours ago. The problem occurs constantly. The problem has not changed since onset.Pertinent negatives include no chest pain, no abdominal pain and no headaches. Exacerbated by: movement. Nothing relieves the symptoms. The treatment provided no relief.    Past Medical History  Diagnosis Date  . Back pain   . Obesity     History of  . Nicotine addiction   . Psychosis   . Hypertension   . OD (overdose of drug)     hospitalized for 11 days   . Bipolar 1 disorder   . Hypercholesteremia   . Migraine headache   . Lateral epicondylitis  of elbow     right  . Cancer     Colon Cancer   Past Surgical History  Procedure Laterality Date  . Partial hysterectomy    . Breast biopsy  10/18/2011    Procedure: BREAST BIOPSY;  Surgeon: Jamesetta So, MD;  Location: [facility];  Service: General;  Laterality: Left;  . Esophagogastroduodenoscopy N/A 12/26/2012    GEX:BMWUXLK reflux esophagitis. Gastric and duodenal bulbar erosions-status post gastric biopsynegative H.pylori  . Abdominal hysterectomy    . Colonoscopy    . Colonoscopy N/A 05/29/2013    GMW:NUUVOZD mass most likely representing colorectal cancer S/ P biospy.Multiple colonic and rectal polyps removed/treated as described above. Colonic diverticulosis  . Colon resection N/A 06/23/2013    Procedure: HAND ASSISTED LAPAROSCOPIC PARTIAL COLECTOMY;  Surgeon: Jamesetta So, MD;  Location: [facility];  Service: General;  Laterality: N/A;   Family History  Problem Relation Age of  Onset  . Anesthesia problems Neg Hx   . Hypotension Neg Hx   . Malignant hyperthermia Neg Hx   . Pseudochol deficiency Neg Hx   . Colon cancer Father     diagnosed in his 43s   History  Substance Use Topics  . Smoking status: Current Every Day Smoker -- 0.50 packs/day for 38 years    Types: Cigarettes  . Smokeless tobacco: Never Used  . Alcohol Use: No   OB History   Grav Para Term Preterm Abortions TAB SAB Ect Mult Living                 Review of Systems  Constitutional: Negative for appetite change and fatigue.  HENT: Negative for congestion, ear discharge and sinus pressure.   Eyes: Negative for discharge.  Respiratory: Negative for cough.   Cardiovascular: Negative for chest pain.  Gastrointestinal: Negative for abdominal pain and diarrhea.  Genitourinary: Positive for flank pain. Negative for frequency and hematuria.  Musculoskeletal: Negative for back pain.  Skin: Negative for rash.  Neurological: Negative for seizures and headaches.  Psychiatric/Behavioral: Negative for hallucinations.      Allergies  Review of patient's allergies indicates no known allergies.  Home Medications   Prior to Admission medications   Medication Sig Start Date End Date Taking? Authorizing Provider  amLODipine-benazepril (LOTREL) 5-10 MG per capsule Take 1 capsule by mouth daily.    Historical Provider, MD  cyclobenzaprine (FLEXERIL) 10 MG  tablet Take 1 tablet (10 mg total) by mouth 3 (three) times daily. 01/15/14   Maudry Diego, MD  dicyclomine (BENTYL) 20 MG tablet Take 1 tablet (20 mg total) by mouth every 6 (six) hours as needed for spasms (abdominal cramping). 09/24/13   Alfonzo Feller, DO  hydrochlorothiazide (MICROZIDE) 12.5 MG capsule Take 12.5 mg by mouth daily.    Historical Provider, MD  ibuprofen (ADVIL,MOTRIN) 200 MG tablet Take 400 mg by mouth every 8 (eight) hours as needed for fever, headache or moderate pain. Taking 2 tablets    Historical Provider, MD   oxyCODONE-acetaminophen (PERCOCET/ROXICET) 5-325 MG per tablet 1 or 2 tabs PO q6h prn pain 09/24/13   Alfonzo Feller, DO  oxyCODONE-acetaminophen (PERCOCET/ROXICET) 5-325 MG per tablet Take 1 tablet by mouth every 6 (six) hours as needed. 01/15/14   Maudry Diego, MD  potassium chloride (K-DUR,KLOR-CON) 10 MEQ tablet Take 1 tablet (10 mEq total) by mouth daily. 08/12/13   Benjamine Mola, FNP  risperiDONE (RISPERDAL) 0.5 MG tablet Take 1 tablet (0.5 mg total) by mouth 2 (two) times daily. 08/12/13   Benjamine Mola, FNP  sertraline (ZOLOFT) 25 MG tablet Take 1 tablet (25 mg total) by mouth daily. 08/12/13   Elyse Jarvis Withrow, FNP   BP 123/84  Pulse 90  Temp(Src) 97.6 F (36.4 C) (Oral)  Resp 24  SpO2 94% Physical Exam  Constitutional: She is oriented to person, place, and time. She appears well-developed.  HENT:  Head: Normocephalic.  Eyes: Conjunctivae and EOM are normal. No scleral icterus.  Neck: Neck supple. No thyromegaly present.  Cardiovascular: Normal rate and regular rhythm.  Exam reveals no gallop and no friction rub.   No murmur heard. Pulmonary/Chest: No stridor. She has no wheezes. She has no rales. She exhibits no tenderness.  Abdominal: She exhibits no distension. There is no tenderness. There is no rebound.  Musculoskeletal: Normal range of motion. She exhibits tenderness. She exhibits no edema.  Tender left flank  Lymphadenopathy:    She has no cervical adenopathy.  Neurological: She is oriented to person, place, and time. She exhibits normal muscle tone. Coordination normal.  Skin: No rash noted. No erythema.  Psychiatric: She has a normal mood and affect. Her behavior is normal.    ED Course  Procedures (including critical care time) Labs Review Labs Reviewed  COMPREHENSIVE METABOLIC PANEL - Abnormal; Notable for the following:    Potassium 3.5 (*)    Total Bilirubin 0.2 (*)    GFR calc non Af Amer 81 (*)    All other components within normal limits  CBC WITH  DIFFERENTIAL  URINALYSIS, ROUTINE W REFLEX MICROSCOPIC    Imaging Review Ct Abdomen Pelvis Wo Contrast  01/15/2014   CLINICAL DATA:  Flank pain  EXAM: CT ABDOMEN AND PELVIS WITHOUT CONTRAST  TECHNIQUE: Multidetector CT imaging of the abdomen and pelvis was performed following the standard protocol without oral or intravenous contrast material administration.  COMPARISON:  September 24, 2013  FINDINGS: Lung bases are clear.  Several cysts are again noted in the liver. The largest cyst is in the posterior segment right lobe near the hepatic renal fossa measuring 2.6 x 2.2 cm. No noncystic liver lesions are identified on this noncontrast enhanced study. Gallbladder wall is not appreciably thickened. There is no biliary duct dilatation.  Spleen, pancreas, and adrenals appear normal. Kidneys bilaterally show no mass or hydronephrosis on either side. There is no renal or ureteral calculus on either side.  In the pelvis, the urinary bladder is midline with wall thickness within normal limits. There is postoperative change in the sigmoid colon, stable. There is no pelvic mass or fluid collection. Uterus is absent. Appendix appears normal. Urinary bladder wall is slightly thickened.  There is no appreciable bowel obstruction. There is no free air or portal venous air.  There is no ascites, adenopathy, or abscess in the abdomen or pelvis. There is atherosclerotic change in the aorta but no aneurysm. There is degenerative change in the lumbar spine. There are no blastic or lytic bone lesions.  IMPRESSION: Stable liver cysts. Postoperative change in the pelvis. No mesenteric inflammation or abscess. Appendix appears normal. No bowel obstruction. No renal or ureteral calculus. No hydronephrosis.  Urinary bladder wall appears slightly thickened. Question a degree of cystitis.   Electronically Signed   By: Lowella Grip M.D.   On: 01/15/2014 17:35   Dg Chest 2 View  01/15/2014   CLINICAL DATA:  Chest pain; history of colon  carcinoma  EXAM: CHEST  2 VIEW  COMPARISON:  September 24, 2013  FINDINGS: There is no edema or consolidation. Heart size and pulmonary vascularity are normal. No apparent parenchymal lung mass or adenopathy. No bone lesions.  IMPRESSION: No edema or consolidation.   Electronically Signed   By: Lowella Grip M.D.   On: 01/15/2014 17:17     EKG Interpretation None      MDM   Final diagnoses:  Thoracic back pain, unspecified back pain laterality    Back pain will tx with flexeril and percocet and follow up with pcp   Maudry Diego, MD 01/15/14 1821

## 2014-04-11 ENCOUNTER — Encounter (HOSPITAL_COMMUNITY): Payer: Self-pay | Admitting: Emergency Medicine

## 2014-04-11 ENCOUNTER — Emergency Department (HOSPITAL_COMMUNITY)
Admission: EM | Admit: 2014-04-11 | Discharge: 2014-04-12 | Disposition: A | Discharge status: Home / Self Care | Payer: Medicare Other | Attending: Emergency Medicine | Admitting: Emergency Medicine

## 2014-04-11 DIAGNOSIS — Z720 Tobacco use: Secondary | ICD-10-CM | POA: Insufficient documentation

## 2014-04-11 DIAGNOSIS — Z85038 Personal history of other malignant neoplasm of large intestine: Secondary | ICD-10-CM | POA: Insufficient documentation

## 2014-04-11 DIAGNOSIS — I10 Essential (primary) hypertension: Secondary | ICD-10-CM | POA: Diagnosis not present

## 2014-04-11 DIAGNOSIS — Z9889 Other specified postprocedural states: Secondary | ICD-10-CM | POA: Diagnosis not present

## 2014-04-11 DIAGNOSIS — Z8739 Personal history of other diseases of the musculoskeletal system and connective tissue: Secondary | ICD-10-CM | POA: Insufficient documentation

## 2014-04-11 DIAGNOSIS — Z90710 Acquired absence of both cervix and uterus: Secondary | ICD-10-CM | POA: Diagnosis not present

## 2014-04-11 DIAGNOSIS — Z79899 Other long term (current) drug therapy: Secondary | ICD-10-CM | POA: Insufficient documentation

## 2014-04-11 DIAGNOSIS — F319 Bipolar disorder, unspecified: Secondary | ICD-10-CM | POA: Diagnosis not present

## 2014-04-11 DIAGNOSIS — K5900 Constipation, unspecified: Secondary | ICD-10-CM | POA: Diagnosis not present

## 2014-04-11 DIAGNOSIS — R109 Unspecified abdominal pain: Secondary | ICD-10-CM

## 2014-04-11 DIAGNOSIS — E669 Obesity, unspecified: Secondary | ICD-10-CM | POA: Insufficient documentation

## 2014-04-11 LAB — CBC WITH DIFFERENTIAL/PLATELET
BASOS ABS: 0 10*3/uL (ref 0.0–0.1)
BASOS PCT: 0 % (ref 0–1)
Eosinophils Absolute: 0.2 10*3/uL (ref 0.0–0.7)
Eosinophils Relative: 3 % (ref 0–5)
HCT: 39.6 % (ref 36.0–46.0)
Hemoglobin: 13.7 g/dL (ref 12.0–15.0)
LYMPHS PCT: 39 % (ref 12–46)
Lymphs Abs: 2.6 10*3/uL (ref 0.7–4.0)
MCH: 30.2 pg (ref 26.0–34.0)
MCHC: 34.6 g/dL (ref 30.0–36.0)
MCV: 87.2 fL (ref 78.0–100.0)
MONO ABS: 0.3 10*3/uL (ref 0.1–1.0)
Monocytes Relative: 5 % (ref 3–12)
NEUTROS ABS: 3.6 10*3/uL (ref 1.7–7.7)
NEUTROS PCT: 53 % (ref 43–77)
PLATELETS: 215 10*3/uL (ref 150–400)
RBC: 4.54 MIL/uL (ref 3.87–5.11)
RDW: 14.6 % (ref 11.5–15.5)
WBC: 6.7 10*3/uL (ref 4.0–10.5)

## 2014-04-11 LAB — URINALYSIS, ROUTINE W REFLEX MICROSCOPIC
Bilirubin Urine: NEGATIVE
Glucose, UA: NEGATIVE mg/dL
Hgb urine dipstick: NEGATIVE
Ketones, ur: NEGATIVE mg/dL
Nitrite: NEGATIVE
PH: 6.5 (ref 5.0–8.0)
Protein, ur: NEGATIVE mg/dL
SPECIFIC GRAVITY, URINE: 1.01 (ref 1.005–1.030)
UROBILINOGEN UA: 0.2 mg/dL (ref 0.0–1.0)

## 2014-04-11 LAB — BASIC METABOLIC PANEL
ANION GAP: 11 (ref 5–15)
BUN: 15 mg/dL (ref 6–23)
CALCIUM: 9.6 mg/dL (ref 8.4–10.5)
CO2: 27 meq/L (ref 19–32)
Chloride: 102 mEq/L (ref 96–112)
Creatinine, Ser: 0.89 mg/dL (ref 0.50–1.10)
GFR calc non Af Amer: 71 mL/min — ABNORMAL LOW (ref >90)
GFR, EST AFRICAN AMERICAN: 82 mL/min — AB (ref >90)
Glucose, Bld: 102 mg/dL — ABNORMAL HIGH (ref 70–99)
Potassium: 3.5 mEq/L — ABNORMAL LOW (ref 3.7–5.3)
SODIUM: 140 meq/L (ref 137–147)

## 2014-04-11 LAB — URINE MICROSCOPIC-ADD ON

## 2014-04-11 LAB — POC OCCULT BLOOD, ED: Fecal Occult Bld: NEGATIVE

## 2014-04-11 MED ORDER — ONDANSETRON HCL 4 MG/2ML IJ SOLN
4.0000 mg | Freq: Once | INTRAMUSCULAR | Status: AC
  Administered 2014-04-12: 4 mg via INTRAVENOUS
  Filled 2014-04-11: qty 2

## 2014-04-11 MED ORDER — MORPHINE SULFATE 4 MG/ML IJ SOLN
4.0000 mg | Freq: Once | INTRAMUSCULAR | Status: AC
  Administered 2014-04-12: 4 mg via INTRAVENOUS
  Filled 2014-04-11: qty 1

## 2014-04-11 NOTE — ED Provider Notes (Signed)
CSN: [id number]     Arrival date & time 04/11/14  2121 History  This chart was scribed for Sharyon Cable, MD by Jeanell Sparrow, ED Scribe. This patient was seen in room [facility] and the patient's care was started at 11:47 PM.   Chief Complaint  Patient presents with  . Abdominal Pain   Patient is a 58 y.o. female presenting with abdominal pain. The history is provided by the patient. No language interpreter was used.  Abdominal Pain Pain radiates to:  Does not radiate Pain severity:  Moderate Duration:  5 days Timing:  Constant Progression:  Waxing and waning Chronicity:  New Relieved by:  Nothing Worsened by:  Nothing tried Ineffective treatments:  Bowel activity Associated symptoms: constipation   Associated symptoms: no chest pain, no dysuria, no fever, no shortness of breath and no vomiting    HPI Comments: Ashley Patrick is a 58 y.o. female who presents to the Emergency Department complaining of constant moderate waxing and waning lower abdominal pain that started 4 days ago. She states that she has been having some constipation that also started 4 days ago. She reports that she took some laxatives without any relief. She denies any chest pain, back pain, fever, or emesis.   Past Medical History  Diagnosis Date  . Back pain   . Obesity     History of  . Nicotine addiction   . Psychosis   . Hypertension   . OD (overdose of drug)     hospitalized for 11 days   . Bipolar 1 disorder   . Hypercholesteremia   . Migraine headache   . Lateral epicondylitis  of elbow     right  . Cancer     Colon Cancer   Past Surgical History  Procedure Laterality Date  . Partial hysterectomy    . Breast biopsy  10/18/2011    Procedure: BREAST BIOPSY;  Surgeon: Jamesetta So, MD;  Location: [facility];  Service: General;  Laterality: Left;  . Esophagogastroduodenoscopy N/A 12/26/2012    TKP:[id number] reflux esophagitis. Gastric and duodenal bulbar erosions-status post gastric biopsynegative  H.pylori  . Abdominal hysterectomy    . Colonoscopy    . Colonoscopy N/A 05/29/2013    EXN:TZGYFVC mass most likely representing colorectal cancer S/ P biospy.Multiple colonic and rectal polyps removed/treated as described above. Colonic diverticulosis  . Colon resection N/A 06/23/2013    Procedure: HAND ASSISTED LAPAROSCOPIC PARTIAL COLECTOMY;  Surgeon: Jamesetta So, MD;  Location: [facility];  Service: General;  Laterality: N/A;   Family History  Problem Relation Age of Onset  . Anesthesia problems Neg Hx   . Hypotension Neg Hx   . Malignant hyperthermia Neg Hx   . Pseudochol deficiency Neg Hx   . Colon cancer Father     diagnosed in his 79s   History  Substance Use Topics  . Smoking status: Current Every Day Smoker -- 0.50 packs/day for 38 years    Types: Cigarettes  . Smokeless tobacco: Never Used  . Alcohol Use: No   OB History   Grav Para Term Preterm Abortions TAB SAB Ect Mult Living                 Review of Systems  Constitutional: Negative for fever.  Respiratory: Negative for shortness of breath.   Cardiovascular: Negative for chest pain.  Gastrointestinal: Positive for abdominal pain and constipation. Negative for vomiting.  Genitourinary: Negative for dysuria.  Musculoskeletal: Negative for back pain.  All other  systems reviewed and are negative.   Allergies  Review of patient's allergies indicates no known allergies.  Home Medications   Prior to Admission medications   Medication Sig Start Date End Date Taking? Authorizing Provider  amLODipine-benazepril (LOTREL) 5-10 MG per capsule Take 1 capsule by mouth daily.   Yes Historical Provider, MD  clonazePAM (KLONOPIN) 1 MG tablet Take 1 mg by mouth 2 (two) times daily.   Yes Historical Provider, MD  hydrochlorothiazide (MICROZIDE) 12.5 MG capsule Take 12.5 mg by mouth daily.   Yes Historical Provider, MD  ibuprofen (ADVIL,MOTRIN) 200 MG tablet Take 400 mg by mouth every 8 (eight) hours as needed for fever,  headache or moderate pain.    Yes Historical Provider, MD  Multiple Vitamin (MULTIVITAMIN WITH MINERALS) TABS tablet Take 1 tablet by mouth daily.   Yes Historical Provider, MD  risperiDONE (RISPERDAL) 0.5 MG tablet Take 1 tablet (0.5 mg total) by mouth 2 (two) times daily. 08/12/13  Yes Benjamine Mola, FNP  sertraline (ZOLOFT) 100 MG tablet Take 100 mg by mouth daily.   Yes Historical Provider, MD  zolpidem (AMBIEN) 10 MG tablet Take 10 mg by mouth every other day.   Yes Historical Provider, MD   BP 122/89  Pulse 92  Temp(Src) 97.6 F (36.4 C) (Oral)  Resp 20  Ht 5\' 5"  (1.651 m)  Wt 192 lb (87.091 kg)  BMI 31.95 kg/m2  SpO2 100% Physical Exam CONSTITUTIONAL: Well developed/well nourished HEAD: Normocephalic/atraumatic EYES: EOMI/PERRL ENMT: Mucous membranes moist NECK: supple no meningeal signs SPINE:entire spine nontender CV: S1/S2 noted, no murmurs/rubs/gallops noted LUNGS: Lungs are clear to auscultation bilaterally, no apparent distress ABDOMEN: soft, mild diffuse lower TTP, scattered bowel sounds noted, no rebound or guarding GU:no cva tenderness RECTAL: No stool impaction, no blood or melena, chaperone present  NEURO: Pt is awake/alert, moves all extremitiesx4 EXTREMITIES: pulses normal, full ROM SKIN: warm, color normal PSYCH: no abnormalities of mood noted  ED Course  Procedures  DIAGNOSTIC STUDIES: Oxygen Saturation is 100% on RA, normal by my interpretation.    COORDINATION OF CARE: 11:51 PM- Pt advised of plan for treatment which includes medication, radiology, and labs and pt agrees.   After observation in the ED, pt is well appearing She is taking PO fluids without difficulty There is no vomiting Her abdominal exam is unremarkable, she has bowel sounds noted throughout abdomen and no focal tenderness and no hernia noted.  Imaging/labs unremarkable and reassuring My suspicion for acute abdominal emergency is low Suspect constipation She has had multiple CT  abd/pelvis scans previously after review of records, will defer further workup for now I feel she is safe/appropriate for discharge home We discussed strict return precautions  Labs Review Labs Reviewed  BASIC METABOLIC PANEL - Abnormal; Notable for the following:    Potassium 3.5 (*)    Glucose, Bld 102 (*)    GFR calc non Af Amer 71 (*)    GFR calc Af Amer 82 (*)    All other components within normal limits  URINALYSIS, ROUTINE W REFLEX MICROSCOPIC - Abnormal; Notable for the following:    Leukocytes, UA SMALL (*)    All other components within normal limits  URINE MICROSCOPIC-ADD ON - Abnormal; Notable for the following:    Bacteria, UA FEW (*)    All other components within normal limits  CBC WITH DIFFERENTIAL  I-STAT CG4 LACTIC ACID, ED  POC OCCULT BLOOD, ED    Imaging Review Dg Abd Acute W/chest  04/12/2014  CLINICAL DATA:  Abdominal pain and constipation for 2 days. History of colon cancer, colon resection, and hysterectomy.  EXAM: ACUTE ABDOMEN SERIES (ABDOMEN 2 VIEW & CHEST 1 VIEW)  COMPARISON:  The 01/15/2014  FINDINGS: Normal heart size and pulmonary vascularity. No focal airspace disease or consolidation in the lungs. No blunting of costophrenic angles. No pneumothorax. Mediastinal contours appear intact.  Scattered gas and stool in the colon. No small or large bowel distention. No free intra-abdominal air. No abnormal air-fluid levels. No radiopaque stones. Visualized bones appear intact. Postoperative changes in the pelvis.  IMPRESSION: No evidence of active pulmonary disease. Nonobstructive bowel gas pattern.   Electronically Signed   By: Lucienne Capers M.D.   On: 04/12/2014 00:57      MDM   Final diagnoses:  Abdominal pain  Constipation, unspecified constipation type  Abdominal pain, unspecified abdominal location    Nursing notes including past medical history and social history reviewed and considered in documentation xrays reviewed and  considered Labs/vital reviewed and considered Previous records reviewed and considered   I personally performed the services described in this documentation, which was scribed in my presence. The recorded information has been reviewed and is accurate.       Sharyon Cable, MD 04/12/14 [phone]

## 2014-04-11 NOTE — ED Notes (Addendum)
Pt c/o abdominal pain and bloating. Last BM was Tuesday, Oct. 20th. Pt has been taking laxatives, colace, and gave herself an enema today. Pt states she may have 4 little pieces of stool that come out looking like little worms. Pt states she sees a small amount of blood on the tissue when she wipes.

## 2014-04-12 ENCOUNTER — Emergency Department (HOSPITAL_COMMUNITY): Payer: Medicare Other

## 2014-04-12 DIAGNOSIS — K5900 Constipation, unspecified: Secondary | ICD-10-CM | POA: Diagnosis not present

## 2014-04-12 LAB — I-STAT CG4 LACTIC ACID, ED: LACTIC ACID, VENOUS: 1.89 mmol/L (ref 0.5–2.2)

## 2014-04-29 ENCOUNTER — Encounter: Payer: Self-pay | Admitting: Internal Medicine

## 2014-05-13 ENCOUNTER — Encounter (HOSPITAL_COMMUNITY): Payer: Self-pay | Admitting: Emergency Medicine

## 2014-05-13 ENCOUNTER — Emergency Department (HOSPITAL_COMMUNITY)
Admission: EM | Admit: 2014-05-13 | Discharge: 2014-05-14 | Disposition: A | Discharge status: Home / Self Care | Payer: PRIVATE HEALTH INSURANCE | Attending: Emergency Medicine | Admitting: Emergency Medicine

## 2014-05-13 ENCOUNTER — Emergency Department (HOSPITAL_COMMUNITY): Payer: PRIVATE HEALTH INSURANCE

## 2014-05-13 DIAGNOSIS — Z79899 Other long term (current) drug therapy: Secondary | ICD-10-CM | POA: Insufficient documentation

## 2014-05-13 DIAGNOSIS — G43909 Migraine, unspecified, not intractable, without status migrainosus: Secondary | ICD-10-CM | POA: Insufficient documentation

## 2014-05-13 DIAGNOSIS — F319 Bipolar disorder, unspecified: Secondary | ICD-10-CM | POA: Diagnosis not present

## 2014-05-13 DIAGNOSIS — Z8589 Personal history of malignant neoplasm of other organs and systems: Secondary | ICD-10-CM | POA: Insufficient documentation

## 2014-05-13 DIAGNOSIS — E669 Obesity, unspecified: Secondary | ICD-10-CM | POA: Insufficient documentation

## 2014-05-13 DIAGNOSIS — I10 Essential (primary) hypertension: Secondary | ICD-10-CM | POA: Insufficient documentation

## 2014-05-13 DIAGNOSIS — R079 Chest pain, unspecified: Secondary | ICD-10-CM

## 2014-05-13 DIAGNOSIS — R0789 Other chest pain: Secondary | ICD-10-CM | POA: Diagnosis not present

## 2014-05-13 DIAGNOSIS — Z720 Tobacco use: Secondary | ICD-10-CM | POA: Insufficient documentation

## 2014-05-13 DIAGNOSIS — Z8739 Personal history of other diseases of the musculoskeletal system and connective tissue: Secondary | ICD-10-CM | POA: Insufficient documentation

## 2014-05-13 LAB — COMPREHENSIVE METABOLIC PANEL
ALT: 41 U/L — ABNORMAL HIGH (ref 0–35)
AST: 48 U/L — AB (ref 0–37)
Albumin: 4.1 g/dL (ref 3.5–5.2)
Alkaline Phosphatase: 124 U/L — ABNORMAL HIGH (ref 39–117)
Anion gap: 13 (ref 5–15)
BUN: 12 mg/dL (ref 6–23)
CALCIUM: 9.5 mg/dL (ref 8.4–10.5)
CO2: 25 mEq/L (ref 19–32)
Chloride: 102 mEq/L (ref 96–112)
Creatinine, Ser: 0.76 mg/dL (ref 0.50–1.10)
GLUCOSE: 100 mg/dL — AB (ref 70–99)
Potassium: 3.7 mEq/L (ref 3.7–5.3)
SODIUM: 140 meq/L (ref 137–147)
Total Bilirubin: 0.2 mg/dL — ABNORMAL LOW (ref 0.3–1.2)
Total Protein: 8.2 g/dL (ref 6.0–8.3)

## 2014-05-13 LAB — CBC WITH DIFFERENTIAL/PLATELET
Basophils Absolute: 0 K/uL (ref 0.0–0.1)
Basophils Relative: 0 % (ref 0–1)
Eosinophils Absolute: 0.3 K/uL (ref 0.0–0.7)
Eosinophils Relative: 4 % (ref 0–5)
HCT: 39.5 % (ref 36.0–46.0)
Hemoglobin: 13.5 g/dL (ref 12.0–15.0)
Lymphocytes Relative: 41 % (ref 12–46)
Lymphs Abs: 2.5 K/uL (ref 0.7–4.0)
MCH: 30.1 pg (ref 26.0–34.0)
MCHC: 34.2 g/dL (ref 30.0–36.0)
MCV: 88.2 fL (ref 78.0–100.0)
Monocytes Absolute: 0.4 K/uL (ref 0.1–1.0)
Monocytes Relative: 7 % (ref 3–12)
Neutro Abs: 3 K/uL (ref 1.7–7.7)
Neutrophils Relative %: 48 % (ref 43–77)
Platelets: 212 K/uL (ref 150–400)
RBC: 4.48 MIL/uL (ref 3.87–5.11)
RDW: 15.4 % (ref 11.5–15.5)
WBC: 6.2 K/uL (ref 4.0–10.5)

## 2014-05-13 LAB — D-DIMER, QUANTITATIVE: D-Dimer, Quant: <0.27 ug/mL-FEU (ref 0.00–0.48)

## 2014-05-13 LAB — TROPONIN I: Troponin I: <0.3 ng/mL

## 2014-05-13 MED ORDER — MORPHINE SULFATE 4 MG/ML IJ SOLN
6.0000 mg | Freq: Once | INTRAMUSCULAR | Status: AC
  Administered 2014-05-13: 6 mg via INTRAVENOUS
  Filled 2014-05-13: qty 2

## 2014-05-13 MED ORDER — ASPIRIN 81 MG PO CHEW
324.0000 mg | CHEWABLE_TABLET | Freq: Once | ORAL | Status: AC
  Administered 2014-05-13: 324 mg via ORAL
  Filled 2014-05-13: qty 4

## 2014-05-13 NOTE — ED Provider Notes (Signed)
CSN: [id number]     Arrival date & time 05/13/14  2241 History  This chart was scribed for Mariea Clonts, MD by Rayfield Citizen, ED Scribe. This patient was seen in room APA12/[facility]12 and the patient's care was started at 11:21 PM.    Chief Complaint  Patient presents with  . Chest Pain   The history is provided by the patient. No language interpreter was used.     HPI Comments: Ashley Patrick is a 58 y.o. female with past medical history of HTN who presents to the Emergency Department complaining of 12 hours of gradually worsening, waxing and waning chest pain, described as tightness localized in the right side of her chest. Her pain is worse with palpation and movement (twisting, bending, etc). She is right hand dominant. She denies prior experience with similar symptoms. She denies recent trauma, injury, or strain. She denies SOB, neck pain, headache, visual disturbances, leg swelling, abnormal weight gain. She denies a history of PE or DVT, denies recent surgery or long-term immobility. She is not on chemo or radiation at this time. She denies a history of DM, heart problems, MI.   Patient is a 1/2ppd smoker. She denies illegal drug use.   Past Medical History  Diagnosis Date  . Back pain   . Obesity     History of  . Nicotine addiction   . Psychosis   . Hypertension   . OD (overdose of drug)     hospitalized for 11 days   . Bipolar 1 disorder   . Hypercholesteremia   . Migraine headache   . Lateral epicondylitis  of elbow     right  . Cancer     Colon Cancer   Past Surgical History  Procedure Laterality Date  . Partial hysterectomy    . Breast biopsy  10/18/2011    Procedure: BREAST BIOPSY;  Surgeon: Jamesetta So, MD;  Location: [facility];  Service: General;  Laterality: Left;  . Esophagogastroduodenoscopy N/A 12/26/2012    QJJ:HERDEYC reflux esophagitis. Gastric and duodenal bulbar erosions-status post gastric biopsynegative H.pylori  . Abdominal hysterectomy    . Colonoscopy     . Colonoscopy N/A 05/29/2013    XKG:YJEHUDJ mass most likely representing colorectal cancer S/ P biospy.Multiple colonic and rectal polyps removed/treated as described above. Colonic diverticulosis  . Colon resection N/A 06/23/2013    Procedure: HAND ASSISTED LAPAROSCOPIC PARTIAL COLECTOMY;  Surgeon: Jamesetta So, MD;  Location: [facility];  Service: General;  Laterality: N/A;   Family History  Problem Relation Age of Onset  . Anesthesia problems Neg Hx   . Hypotension Neg Hx   . Malignant hyperthermia Neg Hx   . Pseudochol deficiency Neg Hx   . Colon cancer Father     diagnosed in his 44s   History  Substance Use Topics  . Smoking status: Current Every Day Smoker -- 0.50 packs/day for 38 years    Types: Cigarettes  . Smokeless tobacco: Never Used  . Alcohol Use: No   OB History    No data available     Review of Systems  Respiratory: Positive for chest tightness.   Cardiovascular: Positive for chest pain.  All other systems reviewed and are negative.   Allergies  Review of patient's allergies indicates no known allergies.  Home Medications   Prior to Admission medications   Medication Sig Start Date End Date Taking? Authorizing Provider  amLODipine-benazepril (LOTREL) 5-10 MG per capsule Take 1 capsule by mouth daily.  Historical Provider, MD  clonazePAM (KLONOPIN) 1 MG tablet Take 1 mg by mouth 2 (two) times daily.    Historical Provider, MD  hydrochlorothiazide (MICROZIDE) 12.5 MG capsule Take 12.5 mg by mouth daily.    Historical Provider, MD  ibuprofen (ADVIL,MOTRIN) 200 MG tablet Take 400 mg by mouth every 8 (eight) hours as needed for fever, headache or moderate pain.     Historical Provider, MD  Multiple Vitamin (MULTIVITAMIN WITH MINERALS) TABS tablet Take 1 tablet by mouth daily.    Historical Provider, MD  risperiDONE (RISPERDAL) 0.5 MG tablet Take 1 tablet (0.5 mg total) by mouth 2 (two) times daily. 08/12/13   Benjamine Mola, FNP  sertraline (ZOLOFT) 100 MG  tablet Take 100 mg by mouth daily.    Historical Provider, MD  zolpidem (AMBIEN) 10 MG tablet Take 10 mg by mouth every other day.    Historical Provider, MD   BP 109/74 mmHg  Pulse 80  Temp(Src) 97.5 F (36.4 C) (Oral)  Resp 20  Ht 5\' 6"  (1.676 m)  Wt 180 lb (81.647 kg)  BMI 29.07 kg/m2  SpO2 98% Physical Exam  Constitutional: She is oriented to person, place, and time. She appears well-developed and well-nourished. No distress.  HENT:  Head: Normocephalic and atraumatic.  Neck: Normal range of motion. Neck supple. No tracheal deviation present.  Cardiovascular: Normal rate, regular rhythm and normal heart sounds.   No murmur heard. Pulmonary/Chest: Effort normal. No respiratory distress. She exhibits tenderness.  Lung sounds equal; significant TTP over the right sternal region without ecchymosis or signs of infection  Abdominal: Soft. There is no tenderness.  Musculoskeletal: She exhibits no edema.  No focal calf tenderness bilaterally  Neurological: She is alert and oriented to person, place, and time.  Skin: Skin is warm and dry.  Psychiatric: She has a normal mood and affect. Her behavior is normal.  Nursing note and vitals reviewed.   ED Course  Procedures   EMERGENCY DEPARTMENT BILIARY ULTRASOUND INTERPRETATION "Study: Limited Abdominal Ultrasound of the gallbladder and common bile duct."  INDICATIONS: RUQ pain Indication: Multiple views of the gallbladder and common bile duct were obtained in real-time with a Multi-frequency probe." PERFORMED BY:  Myself IMAGES ARCHIVED?: Yes FINDINGS: Gallstones absent, Gallbladder wall normal in thickness, Sonographic Murphy's sign absent and Common bile duct normal in size LIMITATIONS: Body Habitus and Bowel Gas INTERPRETATION: Normal   DIAGNOSTIC STUDIES: Oxygen Saturation is 100% on RA, normal by my interpretation.    COORDINATION OF CARE: 11:29 PM Discussed treatment plan with pt at bedside and pt agreed to  plan.   Labs Review Labs Reviewed  COMPREHENSIVE METABOLIC PANEL - Abnormal; Notable for the following:    Glucose, Bld 100 (*)    AST 48 (*)    ALT 41 (*)    Alkaline Phosphatase 124 (*)    Total Bilirubin 0.2 (*)    All other components within normal limits  TROPONIN I  CBC WITH DIFFERENTIAL  D-DIMER, QUANTITATIVE  TROPONIN I    Imaging Review Dg Chest Portable 1 View  05/13/2014   CLINICAL DATA:  Chest pain  EXAM: PORTABLE CHEST - 1 VIEW  COMPARISON:  04/12/2014  FINDINGS: Shallow inspiration which likely accounts for the diffuse coarsening of lung markings. There is no definitive edema or pneumonia. Normal heart size and stable aortic contours. No effusion or pneumothorax.  IMPRESSION: Interstitial coarsening likely from shallow inspiration. No definitive edema or pneumonia.   Electronically Signed   By: Jorje Guild  M.D.   On: 05/13/2014 23:31     EKG Interpretation   Date/Time:  Wednesday May 13 2014 22:51:40 EST Ventricular Rate:  86 PR Interval:  136 QRS Duration: 89 QT Interval:  378 QTC Calculation: 452 R Axis:   19 Text Interpretation:  Sinus rhythm Consider left ventricular hypertrophy  Non specific ST elevation diffuse, no recip depression Confirmed by Traci Gafford   MD, Aliza Moret ([id number]) on 05/14/2014 2:11:32 AM     Repeat EKG heart rate 82 normal QT, LVH, sinus, similar to previous. MDM   Final diagnoses:  Chest pain  Right-sided chest pain   I personally performed the services described in this documentation, which was scribed in my presence. The recorded information has been reviewed and is accurate.  Patient with 3 cardiac risk factors presents with atypical right-sided chest pain worse with palpation and movement. Patient low risk blood clot, d-dimer negative. Patient's symptoms completely resolved in the ER. Patient requesting to go home, second troponin pending. With right lower chest pain discussed possible gallbladder, mild LFT elevation, bedside  ultrasound normal-appearing gallbladder. No symptoms on reassessment. Discussed close follow-up outpatient and reasons to return as patient will likely need a stress test if it returns.  Results and differential diagnosis were discussed with the patient/parent/guardian. Close follow up outpatient was discussed, comfortable with the plan.   Medications  morphine 4 MG/ML injection 6 mg (6 mg Intravenous Given 05/13/14 2341)  aspirin chewable tablet 324 mg (324 mg Oral Given 05/13/14 2340)  morphine 4 MG/ML injection 6 mg (6 mg Intravenous Given 05/14/14 0105)    Filed Vitals:   05/13/14 2330 05/14/14 0030 05/14/14 0100 05/14/14 0200  BP: 137/94 129/88 130/91 109/74  Pulse: 87 84 81 80  Temp:      TempSrc:      Resp: 21 14 16 20   Height:      Weight:      SpO2: 97% 96% 96% 98%    Final diagnoses:  Chest pain  Right-sided chest pain   \   Mariea Clonts, MD 05/14/14 [phone]

## 2014-05-13 NOTE — ED Notes (Signed)
Pt c/o central chest pain starting at 11 this morning and progressively getting worse. Denies SOB, N/V, dizziness.

## 2014-05-14 DIAGNOSIS — R0789 Other chest pain: Secondary | ICD-10-CM | POA: Diagnosis not present

## 2014-05-14 LAB — TROPONIN I: Troponin I: <0.3 ng/mL (ref <0.30)

## 2014-05-14 MED ORDER — MORPHINE SULFATE 4 MG/ML IJ SOLN
6.0000 mg | Freq: Once | INTRAMUSCULAR | Status: AC
Start: 2014-05-14 — End: 2014-05-14
  Administered 2014-05-14: 6 mg via INTRAVENOUS
  Filled 2014-05-14: qty 2

## 2014-05-14 NOTE — ED Notes (Signed)
Pt given crackers and drink. Able to move around in bed without pain. States "I am ready to go home. I feel much better".

## 2014-05-14 NOTE — Discharge Instructions (Signed)
If you were given medicines take as directed.  If you are on coumadin or contraceptives realize their levels and effectiveness is altered by many different medicines.  If you have any reaction (rash, tongues swelling, other) to the medicines stop taking and see a physician.   Please follow up as directed and return to the ER or see a physician for new or worsening symptoms.  Thank you. Filed Vitals:   05/13/14 2330 05/14/14 0030 05/14/14 0100 05/14/14 0200  BP: 137/94 129/88 130/91 109/74  Pulse: 87 84 81 80  Temp:      TempSrc:      Resp: 21 14 16 20   Height:      Weight:      SpO2: 97% 96% 96% 98%    Chest Pain (Nonspecific) It is often hard to give a specific diagnosis for the cause of chest pain. There is always a chance that your pain could be related to something serious, such as a heart attack or a blood clot in the lungs. You need to follow up with your health care provider for further evaluation. CAUSES   Heartburn.  Pneumonia or bronchitis.  Anxiety or stress.  Inflammation around your heart (pericarditis) or lung (pleuritis or pleurisy).  A blood clot in the lung.  A collapsed lung (pneumothorax). It can develop suddenly on its own (spontaneous pneumothorax) or from trauma to the chest.  Shingles infection (herpes zoster virus). The chest wall is composed of bones, muscles, and cartilage. Any of these can be the source of the pain.  The bones can be bruised by injury.  The muscles or cartilage can be strained by coughing or overwork.  The cartilage can be affected by inflammation and become sore (costochondritis). DIAGNOSIS  Lab tests or other studies may be needed to find the cause of your pain. Your health care provider may have you take a test called an ambulatory electrocardiogram (ECG). An ECG records your heartbeat patterns over a 24-hour period. You may also have other tests, such as:  Transthoracic echocardiogram (TTE). During echocardiography, sound waves  are used to evaluate how blood flows through your heart.  Transesophageal echocardiogram (TEE).  Cardiac monitoring. This allows your health care provider to monitor your heart rate and rhythm in real time.  Holter monitor. This is a portable device that records your heartbeat and can help diagnose heart arrhythmias. It allows your health care provider to track your heart activity for several days, if needed.  Stress tests by exercise or by giving medicine that makes the heart beat faster. TREATMENT   Treatment depends on what may be causing your chest pain. Treatment may include:  Acid blockers for heartburn.  Anti-inflammatory medicine.  Pain medicine for inflammatory conditions.  Antibiotics if an infection is present.  You may be advised to change lifestyle habits. This includes stopping smoking and avoiding alcohol, caffeine, and chocolate.  You may be advised to keep your head raised (elevated) when sleeping. This reduces the chance of acid going backward from your stomach into your esophagus. Most of the time, nonspecific chest pain will improve within 2-3 days with rest and mild pain medicine.  HOME CARE INSTRUCTIONS   If antibiotics were prescribed, take them as directed. Finish them even if you start to feel better.  For the next few days, avoid physical activities that bring on chest pain. Continue physical activities as directed.  Do not use any tobacco products, including cigarettes, chewing tobacco, or electronic cigarettes.  Avoid drinking alcohol.  Only take medicine as directed by your health care provider.  Follow your health care provider's suggestions for further testing if your chest pain does not go away.  Keep any follow-up appointments you made. If you do not go to an appointment, you could develop lasting (chronic) problems with pain. If there is any problem keeping an appointment, call to reschedule. SEEK MEDICAL CARE IF:   Your chest pain does not  go away, even after treatment.  You have a rash with blisters on your chest.  You have a fever. SEEK IMMEDIATE MEDICAL CARE IF:   You have increased chest pain or pain that spreads to your arm, neck, jaw, back, or abdomen.  You have shortness of breath.  You have an increasing cough, or you cough up blood.  You have severe back or abdominal pain.  You feel nauseous or vomit.  You have severe weakness.  You faint.  You have chills. This is an emergency. Do not wait to see if the pain will go away. Get medical help at once. Call your local emergency services (911 in [place] Do not drive yourself to the hospital. MAKE SURE YOU:   Understand these instructions.  Will watch your condition.  Will get help right away if you are not doing well or get worse. Document Released: 03/15/2005 Document Revised: 06/10/2013 Document Reviewed: 01/09/2008 [facility] Patient Information 2015 [place], [place]. This information is not intended to replace advice given to you by your health care provider. Make sure you discuss any questions you have with your health care provider.

## 2014-05-14 NOTE — ED Notes (Signed)
MD at bedside. 

## 2014-07-01 ENCOUNTER — Ambulatory Visit: Payer: Medicare Other | Admitting: Nurse Practitioner

## 2014-07-20 ENCOUNTER — Other Ambulatory Visit: Payer: Self-pay

## 2014-07-20 ENCOUNTER — Ambulatory Visit (INDEPENDENT_AMBULATORY_CARE_PROVIDER_SITE_OTHER): Payer: Medicare Other | Admitting: Nurse Practitioner

## 2014-07-20 ENCOUNTER — Encounter: Payer: Self-pay | Admitting: Nurse Practitioner

## 2014-07-20 VITALS — BP 126/84 | HR 105 | Temp 97.0°F | Ht 66.0 in | Wt 213.2 lb

## 2014-07-20 DIAGNOSIS — Z85038 Personal history of other malignant neoplasm of large intestine: Secondary | ICD-10-CM | POA: Diagnosis not present

## 2014-07-20 DIAGNOSIS — K5900 Constipation, unspecified: Secondary | ICD-10-CM

## 2014-07-20 DIAGNOSIS — K2970 Gastritis, unspecified, without bleeding: Secondary | ICD-10-CM

## 2014-07-20 DIAGNOSIS — R1310 Dysphagia, unspecified: Secondary | ICD-10-CM | POA: Insufficient documentation

## 2014-07-20 MED ORDER — OMEPRAZOLE 20 MG PO CPDR: 20.0000 mg | DELAYED_RELEASE_CAPSULE | Freq: Every day | ORAL | Status: DC

## 2014-07-20 MED ORDER — PEG 3350-KCL-NA BICARB-NACL 420 G PO SOLR: 4000.0000 mL | Freq: Once | ORAL | Status: DC

## 2014-07-20 NOTE — Progress Notes (Signed)
Referring Provider: Yevette Edwards, NP Primary Care Physician:  Yevette Edwards, NP Primary GI: Dr. Gala Romney  Chief Complaint  Patient presents with  . Follow-up    HPI:   59 year old female presents for recall list of 1 year follow-up colonoscopy. 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 Had a partial colectomy 06/23/13 which she tolerated well. Tumor staged at T2, N0, M0. Seen by oncology 07/21/13 with no additional treatment indicated, and colonoscopy 1 year and if no polyps repeat every 3 years. No follow-up appt with oncology made. Seen here in 09/2013 for abdominal pain and recommended follow-up with surgeon.  Today she states she's been doing pretty well since April. Occasionally become quite constipated for which she takes magnesium citrate which relieves her symptoms. She typically has to do this about one every 1-2 months. Has tried Miralax but only when she's already constipated. Has also noted bright red blood with bowel movements typically only when she's constipated and is limited to the toilet tissue. Admits occasional intermittent abdominal pain. Has not been back to see surgeon. Denies melena, N/V. Admits GERD symptoms rarely. Has solid food dysphagia 2-3 times a month where she feels food gets stuck in the esophagus with pain and will occasionally burp which caused the food to regurgitate into her mouth. Her dysphagia symptoms are new since her last EGD in July of 2014. Does not take a PPI currently. Denies NSAID use or ASA powder use. Denies any other upper or lower GI symptoms.  Past Medical History  Diagnosis Date  . Back pain   . Obesity     History of  . Nicotine addiction   . Psychosis   . Hypertension   . OD (overdose of drug)     hospitalized for 11 days   . Bipolar 1 disorder   . Hypercholesteremia   . Migraine headache   . Lateral epicondylitis  of  elbow     right  . Cancer     Colon Cancer    Past Surgical History  Procedure Laterality Date  . Partial hysterectomy    . Breast biopsy  10/18/2011    Procedure: BREAST BIOPSY;  Surgeon: Jamesetta So, MD;  Location: [facility];  Service: General;  Laterality: Left;  . Esophagogastroduodenoscopy N/A 12/26/2012    EVO:JJKKXFG reflux esophagitis. Gastric and duodenal bulbar erosions-status post gastric biopsynegative H.pylori  . Abdominal hysterectomy    . Colonoscopy    . Colonoscopy N/A 05/29/2013    HWE:XHBZJIR mass most likely representing colorectal cancer S/ P biospy.Multiple colonic and rectal polyps removed/treated as described above. Colonic diverticulosis  . Colon resection N/A 06/23/2013    Procedure: HAND ASSISTED LAPAROSCOPIC PARTIAL COLECTOMY;  Surgeon: Jamesetta So, MD;  Location: [facility];  Service: General;  Laterality: N/A;    Current Outpatient Prescriptions  Medication Sig Dispense Refill  . amLODipine-benazepril (LOTREL) 5-10 MG per capsule Take 1 capsule by mouth daily.    . clonazePAM (KLONOPIN) 1 MG tablet Take 1 mg by mouth 2 (two) times daily.    . hydrochlorothiazide (MICROZIDE) 12.5 MG capsule Take 12.5 mg by mouth daily.    Marland Kitchen lovastatin (MEVACOR) 20 MG tablet Take 20 mg by mouth at bedtime.    . meloxicam (MOBIC) 7.5 MG tablet Take 7.5 mg by mouth daily.    . mirabegron ER (MYRBETRIQ) 50 MG TB24 tablet Take 50  mg by mouth daily.    . Multiple Vitamin (MULTIVITAMIN WITH MINERALS) TABS tablet Take 1 tablet by mouth daily.    . risperiDONE (RISPERDAL) 0.5 MG tablet Take 1 tablet (0.5 mg total) by mouth 2 (two) times daily. 60 tablet 0  . sertraline (ZOLOFT) 100 MG tablet Take 100 mg by mouth daily.    Marland Kitchen zolpidem (AMBIEN) 10 MG tablet Take 10 mg by mouth every other day.    . ibuprofen (ADVIL,MOTRIN) 200 MG tablet Take 400 mg by mouth every 8 (eight) hours as needed for fever, headache or moderate pain.      No current facility-administered medications for this  visit.    Allergies as of 07/20/2014  . (No Known Allergies)    Family History  Problem Relation Age of Onset  . Anesthesia problems Neg Hx   . Hypotension Neg Hx   . Malignant hyperthermia Neg Hx   . Pseudochol deficiency Neg Hx   . Colon cancer Father     diagnosed in his 37s    History   Social History  . Marital Status: Legally Separated    Spouse Name: N/A    Number of Children: 3  . Years of Education: N/A   Social History Main Topics  . Smoking status: Current Every Day Smoker -- 0.50 packs/day for 38 years    Types: Cigarettes  . Smokeless tobacco: Never Used     Comment: vapor only  . Alcohol Use: No  . Drug Use: No  . Sexual Activity: Yes    Birth Control/ Protection: Surgical   Other Topics Concern  . None   Social History Narrative    Review of Systems: Gen: Denies fever, chills, anorexia. Denies fatigue, weakness, weight loss.  CV: Denies chest pain, palpitations, syncope, peripheral edema, and claudication. Resp: Denies dyspnea at rest, cough, wheezing, coughing up blood, and pleurisy. GI: See HPI. Derm: Denies rash, itching, dry skin Psych: Denies depression, anxiety, memory loss, confusion.  Heme: Denies bruising, bleeding, and enlarged lymph nodes.  Physical Exam: BP 126/84 mmHg  Pulse 105  Temp(Src) 97 F (36.1 C)  Ht 5\' 6"  (1.676 m)  Wt 213 lb 3.2 oz (96.707 kg)  BMI 34.43 kg/m2 General:   Alert and oriented. No distress noted. Pleasant and cooperative.  Head:  Normocephalic and atraumatic. Eyes:  Conjuctiva clear without scleral icterus. Mouth:  Oral mucosa pink and moist. Good dentition. No lesions. Neck:  Supple, without mass or thyromegaly. Lungs:  Clear to auscultation bilaterally. No wheezes, rales, or rhonchi. No distress.  Heart:  S1, S2 present without murmurs, rubs, or gallops. Regular rate and rhythm. Abdomen:  +BS, soft, non-tender and non-distended. No rebound or guarding. No HSM or masses noted. Msk:  Symmetrical  without gross deformities. Normal posture. Pulses:  2+ DP noted bilaterally Extremities:  Without edema. Neurologic:  Alert and  oriented x4;  grossly normal neurologically. Skin:  Intact without significant lesions or rashes. Cervical Nodes:  No significant cervical adenopathy. Psych:  Alert and cooperative. Normal mood and affect.    07/20/2014 1:48 PM

## 2014-07-20 NOTE — Patient Instructions (Addendum)
1. We will schedule your procedures for you (colonoscopy and endoscopy). We will do those in the OR with heavier sedation due to previous panic attacks prior to procedure. 2. Further recommendations to be based on the results of the procedure. 3. Start prilosec 20 mg once a day before your first meal of the day to help with heartburn symptoms 4. Miralax 17g three times a week for regularity. If you still have intermittent constipation can try taking it every day.

## 2014-07-22 DIAGNOSIS — K5900 Constipation, unspecified: Secondary | ICD-10-CM | POA: Insufficient documentation

## 2014-07-22 NOTE — Assessment & Plan Note (Signed)
Rare/occasional GERD symptoms, was previously started on Protonix 40 mg bid though she is not currently taking it. Will restart that medication. Also due to new onset dysphagia symptoms will proceed with EGD in addition to the needed surveillance TCS which will aid in evaluation of esophagitis.  Proceed with TCS and EGD +/- dilation in the OR with propofol with Dr. Gala Romney in near future: the risks, benefits, and alternatives have been discussed with the patient in detail. The patient states understanding and desires to proceed.

## 2014-07-22 NOTE — Assessment & Plan Note (Signed)
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 Had a partial colectomy 06/23/13 which she tolerated well. Tumor staged at T2, N0, M0. Seen by oncology 07/21/13 with no additional treatment indicated, and colonoscopy 1 year and if no polyps repeat every 3 years. Currently doing well, no new symptoms. Will proceed with surveillance colonoscopy.  Has previously had panic attacks associated with colonoscopy/endoscopy. Will have procedure scheduled for the OR with propofol.  Proceed with TCS and EGD +/- dilation in the OR with propofol with Dr. Gala Romney in near future: the risks, benefits, and alternatives have been discussed with the patient in detail. The patient states understanding and desires to proceed.

## 2014-07-22 NOTE — Assessment & Plan Note (Signed)
Occasional constipation relieved with magnesium citrate. Not a constant problem. Recommend trying Miralax three times a week, or can increase to daily. Will re-evaluate for improvement and consider other medications as needed for improved control.

## 2014-07-22 NOTE — Assessment & Plan Note (Signed)
Patient with solid food dysphagia which is new since her last colonoscopy in 2014. 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 Was started on Protonix 40 mg bid at that time although she is not currently taking it. Will proceed with EGD in addition to the surveilence TCS to evaluate new solid food dysphagia. Likely due to esophagitis but cannot rule out stricture, web, or ring.  Proceed with TCS and EGD +/- dilation in the OR with propofol with Dr. Gala Romney in near future: the risks, benefits, and alternatives have been discussed with the patient in detail. The patient states understanding and desires to proceed.

## 2014-07-29 ENCOUNTER — Other Ambulatory Visit (HOSPITAL_COMMUNITY): Payer: Self-pay | Admitting: Nurse Practitioner

## 2014-07-29 ENCOUNTER — Other Ambulatory Visit: Payer: Self-pay | Admitting: Nurse Practitioner

## 2014-07-29 DIAGNOSIS — Z13820 Encounter for screening for osteoporosis: Secondary | ICD-10-CM

## 2014-08-05 ENCOUNTER — Ambulatory Visit (HOSPITAL_COMMUNITY)
Admission: RE | Admit: 2014-08-05 | Discharge: 2014-08-05 | Disposition: A | Discharge status: Home / Self Care | Payer: Medicare Other | Source: Ambulatory Visit | Attending: Nurse Practitioner | Admitting: Nurse Practitioner

## 2014-08-05 DIAGNOSIS — Z13820 Encounter for screening for osteoporosis: Secondary | ICD-10-CM

## 2014-08-06 NOTE — Patient Instructions (Signed)
Ashley Patrick  08/06/2014   Your procedure is scheduled on:  08/13/2014  Report to [facility] at 8:30 AM.  Call this number if you have problems the morning of surgery: [phone]   Remember:   Do not eat food or drink liquids after midnight.   Take these medicines the morning of surgery with A SIP OF WATER: Amlodipine, Klonopin, Mobic, Mirabegron, Prilosec, Zoloft, Vesicare,  Risperdal     Do not wear jewelry, make-up or nail polish.  Do not wear lotions, powders, or perfumes. You may wear deodorant.  Do not shave 48 hours prior to surgery. Men may shave face and neck.  Do not bring valuables to the hospital.  [facility] is not responsible for any belongings or valuables.               Contacts, dentures or bridgework may not be worn into surgery.  Leave suitcase in the car. After surgery it may be brought to your room.  For patients admitted to the hospital, discharge time is determined by your treatment team.               Patients discharged the day of surgery will not be allowed to drive home.  Name and phone number of your driver:   Special Instructions: N/A   Please read over the following fact sheets that you were given: Anesthesia Post-op Instructions   PATIENT INSTRUCTIONS POST-ANESTHESIA  IMMEDIATELY FOLLOWING SURGERY:  Do not drive or operate machinery for the first twenty four hours after surgery.  Do not make any important decisions for twenty four hours after surgery or while taking narcotic pain medications or sedatives.  If you develop intractable nausea and vomiting or a severe headache please notify your doctor immediately.  FOLLOW-UP:  Please make an appointment with your surgeon as instructed. You do not need to follow up with anesthesia unless specifically instructed to do so.  WOUND CARE INSTRUCTIONS (if applicable):  Keep a dry clean dressing on the anesthesia/puncture wound site if there is drainage.  Once the wound has quit draining you may leave it  open to air.  Generally you should leave the bandage intact for twenty four hours unless there is drainage.  If the epidural site drains for more than 36-48 hours please call the anesthesia department.  QUESTIONS?:  Please feel free to call your physician or the hospital operator if you have any questions, and they will be happy to assist you.      Esophageal Dilatation The esophagus is the long, narrow tube which carries food and liquid from the mouth to the stomach. Esophageal dilatation is the technique used to stretch a blocked or narrowed portion of the esophagus. This procedure is used when a part of the esophagus has become so narrow that it becomes difficult, painful or even impossible to swallow. This is generally an uncomplicated form of treatment. When this is not successful, chest surgery may be required. This is a much more extensive form of treatment with a longer recovery time. CAUSES  Some of the more common causes of blockage or strictures of the esophagus are:  Narrowing from longstanding inflammation (soreness and redness) of the lower esophagus. This comes from the constant exposure of the lower esophagus to the acid which bubbles up from the stomach. Over time this causes scarring and narrowing of the lower esophagus.  Hiatal hernia in which a small part of the stomach bulges (herniates) up through the diaphragm. This can cause a  gradual narrowing of the end of the esophagus.  Schatzki ring is a narrow ring of benign (non-cancerous) fibrous tissue which constricts the lower esophagus. The reason for this is not known.  Scleroderma is a connective tissue disorder that affects the esophagus and makes swallowing difficult.  Achalasia is an absence of nerves to the lower esophagus and to the esophageal sphincter. This is the circular muscle between the stomach and esophagus that relaxes to allow food into the stomach. After swallowing, it contracts to keep food in the stomach. This  absence of nerves may be congenital (present since birth). This can cause irregular spasms of the lower esophageal muscle. This spasm does not open up to allow food and fluid through. The result is a persistent blockage with subsequent slow trickling of the esophageal contents into the stomach.  Strictures may develop from swallowing materials which damage the esophagus. Some examples are strong acids or alkalis such as lye.  Growths such as benign (non-cancerous) and malignant (cancerous) tumors can block the esophagus.  Hereditary (present since birth) causes. DIAGNOSIS  Your caregiver often suspects this problem by taking a medical history. They will also do a physical exam. They can then prove their suspicions using X-rays and endoscopy. Endoscopy is an exam in which a tube like a small, flexible telescope is used to look at your esophagus.  TREATMENT There are different stretching (dilating) techniques that can be used. Simple bougie dilatation may be done in the office. This usually takes only a couple minutes. A numbing (anesthetic) spray of the throat is used. Endoscopy, when done, is done in an endoscopy suite under mild sedation. When fluoroscopy is used, the procedure is performed in X-ray. Other techniques require a little longer time. Recovery is usually quick. There is no waiting time to begin eating and drinking to test success of the treatment. Following are some of the methods used. Narrowing of the esophagus is treated by making it bigger. Commonly this is a mechanical problem which can be treated with stretching. This can be done in different ways. Your caregiver will discuss these with you. Some of the means used are:  A series of graduated (increasing thickness) flexible dilators can be used. These are weighted tubes passed through the esophagus into the stomach. The tubes used become progressively larger until the desired stretched size is reached. Graduated dilators are a simple  and quick way of opening the esophagus. No visualization is required.  Another method is the use of endoscopy to place a flexible wire across the stricture. The endoscope is removed and the wire left in place. A dilator with a hole through it from end to end is guided down the esophagus and across the stricture. One or more of these dilators are passed over the wire. At the end of the exam, the wire is removed. This type of treatment may be performed in the X-ray department under fluoroscopy. An advantage of this procedure is the examiner is visualizing the end opening in the esophagus.  Stretching of the esophagus may be done using balloons. Deflated balloons are placed through the endoscope and across the stricture. This type of balloon dilatation is often done at the time of endoscopy or fluoroscopy. Flexible endoscopy allows the examiner to directly view the stricture. A balloon is inserted in the deflated form into the area of narrowing. It is then inflated with air to a certain pressure that is preset for a given circumference. When inflated, it becomes sausage shaped, stretched, and  makes the stricture larger.  Achalasia requires a longer, larger balloon-type dilator. This is frequently done under X-ray control. In this situation, the spastic muscle fibers in the lower esophagus are stretched. All of the above procedures make the passage of food and water into the stomach easier. They also make it easier for stomach contents to reflux back into the esophagus. Special medications may be used following the procedure to help prevent further stricturing. Proton-pump inhibitor medications are good at decreasing the amount of acid in the stomach juice. When stomach juice refluxes into the esophagus, the juice is no longer as acidic and is less likely to burn or scar the esophagus. RISKS AND COMPLICATIONS Esophageal dilatation is usually performed effectively and without problems. Some complications that  can occur are:  A small amount of bleeding almost always happens where the stretching takes place. If this is too excessive it may require more aggressive treatment.  An uncommon complication is perforation (making a hole) of the esophagus. The esophagus is thin. It is easy to make a hole in it. If this happens, an operation may be necessary to repair this.  A small, undetected perforation could lead to an infection in the chest. This can be very serious. HOME CARE INSTRUCTIONS   If you received sedation for your procedure, do not drive, make important decisions, or perform any activities requiring your full coordination. Do not drink alcohol, take sedatives, or use any mind altering chemicals unless instructed by your caregiver.  You may use throat lozenges or warm salt water gargles if you have throat discomfort.  You can begin eating and drinking normally on return home unless instructed otherwise. Do not purposely try to force large chunks of food down to test the benefits of your procedure.  Mild discomfort can be eased with sips of ice water.  Medications for discomfort may or may not be needed. SEEK IMMEDIATE MEDICAL CARE IF:   You begin vomiting up blood.  You develop black, tarry stools.  You develop chills or an unexplained temperature of over 101F (38.3C)  You develop chest or abdominal pain.  You develop shortness of breath, or feel light-headed or faint.  Your swallowing is becoming more painful, difficult, or you are unable to swallow. MAKE SURE YOU:   Understand these instructions.  Will watch your condition.  Will get help right away if you are not doing well or get worse. Document Released: 07/27/2005 Document Revised: 10/20/2013 Document Reviewed: 09/13/2005 [facility] Patient Information 2015 [place], [place]. This information is not intended to replace advice given to you by your health care provider. Make sure you discuss any questions you have with your  health care provider. Esophagogastroduodenoscopy Esophagogastroduodenoscopy (EGD) is a procedure to examine the lining of the esophagus, stomach, and first part of the small intestine (duodenum). A long, flexible, lighted tube with a camera attached (endoscope) is inserted down the throat to view these organs. This procedure is done to detect problems or abnormalities, such as inflammation, bleeding, ulcers, or growths, in order to treat them. The procedure lasts about 5-20 minutes. It is usually an outpatient procedure, but it may need to be performed in emergency cases in the hospital. LET YOUR CAREGIVER KNOW ABOUT:   Allergies to food or medicine.  All medicines you are taking, including vitamins, herbs, eyedrops, and over-the-counter medicines and creams.  Use of steroids (by mouth or creams).  Previous problems you or members of your family have had with the use of anesthetics.  Any blood  disorders you have.  Previous surgeries you have had.  Other health problems you have.  Possibility of pregnancy, if this applies. RISKS AND COMPLICATIONS  Generally, EGD is a safe procedure. However, as with any procedure, complications can occur. Possible complications include:  Infection.  Bleeding.  Tearing (perforation) of the esophagus, stomach, or duodenum.  Difficulty breathing or not being able to breath.  Excessive sweating.  Spasms of the larynx.  Slowed heartbeat.  Low blood pressure. BEFORE THE PROCEDURE  Do not eat or drink anything for 6-8 hours before the procedure or as directed by your caregiver.  Ask your caregiver about changing or stopping your regular medicines.  If you wear dentures, be prepared to remove them before the procedure.  Arrange for someone to drive you home after the procedure. PROCEDURE   A vein will be accessed to give medicines and fluids. A medicine to relax you (sedative) and a pain reliever will be given through that access into the  vein.  A numbing medicine (local anesthetic) may be sprayed on your throat for comfort and to stop you from gagging or coughing.  A mouth guard may be placed in your mouth to protect your teeth and to keep you from biting on the endoscope.  You will be asked to lie on your left side.  The endoscope is inserted down your throat and into the esophagus, stomach, and duodenum.  Air is put through the endoscope to allow your caregiver to view the lining of your esophagus clearly.  The esophagus, stomach, and duodenum is then examined. During the exam, your caregiver may:  Remove tissue to be examined under a microscope (biopsy) for inflammation, infection, or other medical problems.  Remove growths.  Remove objects (foreign bodies) that are stuck.  Treat any bleeding with medicines or other devices that stop tissues from bleeding (hot cautery, clipping devices).  Widen (dilate) or stretch narrowed areas of the esophagus and stomach.  The endoscope will then be withdrawn. AFTER THE PROCEDURE  You will be taken to a recovery area to be monitored. You will be able to go home once you are stable and alert.  Do not eat or drink anything until the local anesthetic and numbing medicines have worn off. You may choke.  It is normal to feel bloated, have pain with swallowing, or have a sore throat for a short time. This will wear off.  Your caregiver should be able to discuss his or her findings with you. It will take longer to discuss the test results if any biopsies were taken. Document Released: 10/06/2004 Document Revised: 10/20/2013 Document Reviewed: 05/08/2012 [facility] Patient Information 2015 [place], [place]. This information is not intended to replace advice given to you by your health care provider. Make sure you discuss any questions you have with your health care provider. Colonoscopy A colonoscopy is an exam to look at the entire large intestine (colon). This exam can help find  problems such as tumors, polyps, inflammation, and areas of bleeding. The exam takes about 1 hour.  LET [facility] CARE PROVIDER KNOW ABOUT:   Any allergies you have.  All medicines you are taking, including vitamins, herbs, eye drops, creams, and over-the-counter medicines.  Previous problems you or members of your family have had with the use of anesthetics.  Any blood disorders you have.  Previous surgeries you have had.  Medical conditions you have. RISKS AND COMPLICATIONS  Generally, this is a safe procedure. However, as with any procedure, complications can occur.  Possible complications include:  Bleeding.  Tearing or rupture of the colon wall.  Reaction to medicines given during the exam.  Infection (rare). BEFORE THE PROCEDURE   Ask your health care provider about changing or stopping your regular medicines.  You may be prescribed an oral bowel prep. This involves drinking a large amount of medicated liquid, starting the day before your procedure. The liquid will cause you to have multiple loose stools until your stool is almost clear or light green. This cleans out your colon in preparation for the procedure.  Do not eat or drink anything else once you have started the bowel prep, unless your health care provider tells you it is safe to do so.  Arrange for someone to drive you home after the procedure. PROCEDURE   You will be given medicine to help you relax (sedative).  You will lie on your side with your knees bent.  A long, flexible tube with a light and camera on the end (colonoscope) will be inserted through the rectum and into the colon. The camera sends video back to a computer screen as it moves through the colon. The colonoscope also releases carbon dioxide gas to inflate the colon. This helps your health care provider see the area better.  During the exam, your health care provider may take a small tissue sample (biopsy) to be examined under a microscope  if any abnormalities are found.  The exam is finished when the entire colon has been viewed. AFTER THE PROCEDURE   Do not drive for 24 hours after the exam.  You may have a small amount of blood in your stool.  You may pass moderate amounts of gas and have mild abdominal cramping or bloating. This is caused by the gas used to inflate your colon during the exam.  Ask when your test results will be ready and how you will get your results. Make sure you get your test results. Document Released: 06/02/2000 Document Revised: 03/26/2013 Document Reviewed: 02/10/2013 [facility] Patient Information 2015 [place], [place]. This information is not intended to replace advice given to you by your health care provider. Make sure you discuss any questions you have with your health care provider.

## 2014-08-07 ENCOUNTER — Encounter (HOSPITAL_COMMUNITY)
Admission: RE | Admit: 2014-08-07 | Discharge: 2014-08-07 | Disposition: A | Discharge status: Home / Self Care | Payer: Medicare Other | Source: Ambulatory Visit | Attending: Internal Medicine | Admitting: Internal Medicine

## 2014-08-07 ENCOUNTER — Encounter (HOSPITAL_COMMUNITY): Payer: Self-pay

## 2014-08-07 DIAGNOSIS — Z01818 Encounter for other preprocedural examination: Secondary | ICD-10-CM | POA: Diagnosis not present

## 2014-08-07 DIAGNOSIS — C189 Malignant neoplasm of colon, unspecified: Secondary | ICD-10-CM | POA: Insufficient documentation

## 2014-08-07 DIAGNOSIS — R1310 Dysphagia, unspecified: Secondary | ICD-10-CM | POA: Insufficient documentation

## 2014-08-07 LAB — CBC
HEMATOCRIT: 39.2 % (ref 36.0–46.0)
HEMOGLOBIN: 13.3 g/dL (ref 12.0–15.0)
MCH: 30.3 pg (ref 26.0–34.0)
MCHC: 33.9 g/dL (ref 30.0–36.0)
MCV: 89.3 fL (ref 78.0–100.0)
PLATELETS: 213 10*3/uL (ref 150–400)
RBC: 4.39 MIL/uL (ref 3.87–5.11)
RDW: 14.4 % (ref 11.5–15.5)
WBC: 5.4 10*3/uL (ref 4.0–10.5)

## 2014-08-07 LAB — BASIC METABOLIC PANEL
ANION GAP: 3 — AB (ref 5–15)
BUN: 19 mg/dL (ref 6–23)
CHLORIDE: 109 mmol/L (ref 96–112)
CO2: 28 mmol/L (ref 19–32)
CREATININE: 0.7 mg/dL (ref 0.50–1.10)
Calcium: 9.6 mg/dL (ref 8.4–10.5)
GFR calc Af Amer: >90 mL/min (ref >90)
GFR calc non Af Amer: >90 mL/min (ref >90)
Glucose, Bld: 98 mg/dL (ref 70–99)
Potassium: 4.1 mmol/L (ref 3.5–5.1)
Sodium: 140 mmol/L (ref 135–145)

## 2014-08-07 LAB — SURGICAL PCR SCREEN
MRSA, PCR: NEGATIVE
STAPHYLOCOCCUS AUREUS: NEGATIVE

## 2014-08-08 NOTE — Progress Notes (Signed)
CC'ED TO PCP 

## 2014-08-13 ENCOUNTER — Encounter (HOSPITAL_COMMUNITY)
Admission: RE | Disposition: A | Discharge status: Home / Self Care | Payer: Self-pay | Source: Ambulatory Visit | Attending: Internal Medicine

## 2014-08-13 ENCOUNTER — Encounter (HOSPITAL_COMMUNITY): Payer: Self-pay | Admitting: *Deleted

## 2014-08-13 ENCOUNTER — Ambulatory Visit (HOSPITAL_COMMUNITY): Payer: Medicare Other | Admitting: Anesthesiology

## 2014-08-13 ENCOUNTER — Ambulatory Visit (HOSPITAL_COMMUNITY)
Admission: RE | Admit: 2014-08-13 | Discharge: 2014-08-13 | Disposition: A | Discharge status: Home / Self Care | Payer: Medicare Other | Source: Ambulatory Visit | Attending: Internal Medicine | Admitting: Internal Medicine

## 2014-08-13 DIAGNOSIS — Z791 Long term (current) use of non-steroidal anti-inflammatories (NSAID): Secondary | ICD-10-CM | POA: Insufficient documentation

## 2014-08-13 DIAGNOSIS — K635 Polyp of colon: Secondary | ICD-10-CM | POA: Diagnosis not present

## 2014-08-13 DIAGNOSIS — K449 Diaphragmatic hernia without obstruction or gangrene: Secondary | ICD-10-CM | POA: Insufficient documentation

## 2014-08-13 DIAGNOSIS — G4730 Sleep apnea, unspecified: Secondary | ICD-10-CM | POA: Insufficient documentation

## 2014-08-13 DIAGNOSIS — K5790 Diverticulosis of intestine, part unspecified, without perforation or abscess without bleeding: Secondary | ICD-10-CM | POA: Diagnosis not present

## 2014-08-13 DIAGNOSIS — D120 Benign neoplasm of cecum: Secondary | ICD-10-CM | POA: Diagnosis not present

## 2014-08-13 DIAGNOSIS — F319 Bipolar disorder, unspecified: Secondary | ICD-10-CM | POA: Diagnosis not present

## 2014-08-13 DIAGNOSIS — Z79899 Other long term (current) drug therapy: Secondary | ICD-10-CM | POA: Diagnosis not present

## 2014-08-13 DIAGNOSIS — F17210 Nicotine dependence, cigarettes, uncomplicated: Secondary | ICD-10-CM | POA: Diagnosis not present

## 2014-08-13 DIAGNOSIS — Z9049 Acquired absence of other specified parts of digestive tract: Secondary | ICD-10-CM | POA: Diagnosis not present

## 2014-08-13 DIAGNOSIS — K229 Disease of esophagus, unspecified: Secondary | ICD-10-CM

## 2014-08-13 DIAGNOSIS — Z800 Family history of malignant neoplasm of digestive organs: Secondary | ICD-10-CM | POA: Insufficient documentation

## 2014-08-13 DIAGNOSIS — E669 Obesity, unspecified: Secondary | ICD-10-CM | POA: Diagnosis not present

## 2014-08-13 DIAGNOSIS — D124 Benign neoplasm of descending colon: Secondary | ICD-10-CM | POA: Diagnosis not present

## 2014-08-13 DIAGNOSIS — I10 Essential (primary) hypertension: Secondary | ICD-10-CM | POA: Diagnosis not present

## 2014-08-13 DIAGNOSIS — Z6834 Body mass index (BMI) 34.0-34.9, adult: Secondary | ICD-10-CM | POA: Diagnosis not present

## 2014-08-13 DIAGNOSIS — E780 Pure hypercholesterolemia: Secondary | ICD-10-CM | POA: Diagnosis not present

## 2014-08-13 DIAGNOSIS — Z90711 Acquired absence of uterus with remaining cervical stump: Secondary | ICD-10-CM | POA: Diagnosis not present

## 2014-08-13 DIAGNOSIS — Z85038 Personal history of other malignant neoplasm of large intestine: Secondary | ICD-10-CM | POA: Diagnosis not present

## 2014-08-13 DIAGNOSIS — D122 Benign neoplasm of ascending colon: Secondary | ICD-10-CM | POA: Insufficient documentation

## 2014-08-13 DIAGNOSIS — G43909 Migraine, unspecified, not intractable, without status migrainosus: Secondary | ICD-10-CM | POA: Insufficient documentation

## 2014-08-13 DIAGNOSIS — R1310 Dysphagia, unspecified: Secondary | ICD-10-CM | POA: Diagnosis present

## 2014-08-13 DIAGNOSIS — K228 Other specified diseases of esophagus: Secondary | ICD-10-CM | POA: Insufficient documentation

## 2014-08-13 DIAGNOSIS — R1013 Epigastric pain: Secondary | ICD-10-CM

## 2014-08-13 DIAGNOSIS — Z860100 Personal history of colon polyps, unspecified: Secondary | ICD-10-CM | POA: Insufficient documentation

## 2014-08-13 DIAGNOSIS — D123 Benign neoplasm of transverse colon: Secondary | ICD-10-CM

## 2014-08-13 DIAGNOSIS — Z08 Encounter for follow-up examination after completed treatment for malignant neoplasm: Secondary | ICD-10-CM | POA: Diagnosis present

## 2014-08-13 DIAGNOSIS — K5730 Diverticulosis of large intestine without perforation or abscess without bleeding: Secondary | ICD-10-CM | POA: Insufficient documentation

## 2014-08-13 HISTORY — PX: BIOPSY: SHX5522

## 2014-08-13 HISTORY — PX: POLYPECTOMY: SHX5525

## 2014-08-13 HISTORY — PX: ESOPHAGOGASTRODUODENOSCOPY (EGD) WITH PROPOFOL: SHX5813

## 2014-08-13 HISTORY — PX: COLONOSCOPY WITH PROPOFOL: SHX5780

## 2014-08-13 HISTORY — PX: ESOPHAGEAL DILATION: SHX303

## 2014-08-13 SURGERY — ESOPHAGOGASTRODUODENOSCOPY (EGD) WITH PROPOFOL
Anesthesia: Monitor Anesthesia Care

## 2014-08-13 MED ORDER — LACTATED RINGERS IV SOLN
INTRAVENOUS | Status: DC
  Administered 2014-08-13: 1000 mL via INTRAVENOUS

## 2014-08-13 MED ORDER — MIDAZOLAM HCL 2 MG/2ML IJ SOLN
INTRAMUSCULAR | Status: AC
  Filled 2014-08-13: qty 2

## 2014-08-13 MED ORDER — PROPOFOL 10 MG/ML IV BOLUS
INTRAVENOUS | Status: AC
  Filled 2014-08-13: qty 20

## 2014-08-13 MED ORDER — LACTATED RINGERS IV SOLN
INTRAVENOUS | Status: DC | PRN
  Administered 2014-08-13: 09:00:00 via INTRAVENOUS

## 2014-08-13 MED ORDER — PROPOFOL INFUSION 10 MG/ML OPTIME
INTRAVENOUS | Status: DC | PRN
  Administered 2014-08-13: 150 ug/kg/min via INTRAVENOUS
  Administered 2014-08-13: 11:00:00 via INTRAVENOUS

## 2014-08-13 MED ORDER — MIDAZOLAM HCL 2 MG/2ML IJ SOLN
1.0000 mg | INTRAMUSCULAR | Status: DC | PRN
Start: 2014-08-13 — End: 2014-08-13
  Administered 2014-08-13 (×2): 2 mg via INTRAVENOUS
  Filled 2014-08-13: qty 2

## 2014-08-13 MED ORDER — MIDAZOLAM HCL 5 MG/5ML IJ SOLN
INTRAMUSCULAR | Status: DC | PRN
  Administered 2014-08-13: 1 mg via INTRAVENOUS

## 2014-08-13 MED ORDER — FENTANYL CITRATE 0.05 MG/ML IJ SOLN
25.0000 ug | INTRAMUSCULAR | Status: AC
  Administered 2014-08-13 (×2): 25 ug via INTRAVENOUS

## 2014-08-13 MED ORDER — FENTANYL CITRATE 0.05 MG/ML IJ SOLN
INTRAMUSCULAR | Status: DC | PRN
  Administered 2014-08-13: 25 ug via INTRAVENOUS

## 2014-08-13 MED ORDER — LIDOCAINE HCL (PF) 1 % IJ SOLN
INTRAMUSCULAR | Status: AC
  Filled 2014-08-13: qty 5

## 2014-08-13 MED ORDER — DEXAMETHASONE SODIUM PHOSPHATE 4 MG/ML IJ SOLN
4.0000 mg | Freq: Once | INTRAMUSCULAR | Status: AC
  Administered 2014-08-13: 4 mg via INTRAVENOUS

## 2014-08-13 MED ORDER — FENTANYL CITRATE 0.05 MG/ML IJ SOLN: 25.0000 ug | INTRAMUSCULAR | Status: DC | PRN

## 2014-08-13 MED ORDER — GLYCOPYRROLATE 0.2 MG/ML IJ SOLN
0.2000 mg | Freq: Once | INTRAMUSCULAR | Status: AC
  Administered 2014-08-13: 0.2 mg via INTRAVENOUS

## 2014-08-13 MED ORDER — ONDANSETRON HCL 4 MG/2ML IJ SOLN: 4.0000 mg | Freq: Once | INTRAMUSCULAR | Status: DC | PRN

## 2014-08-13 MED ORDER — FENTANYL CITRATE 0.05 MG/ML IJ SOLN
INTRAMUSCULAR | Status: AC
  Filled 2014-08-13: qty 2

## 2014-08-13 MED ORDER — ONDANSETRON HCL 4 MG/2ML IJ SOLN
4.0000 mg | Freq: Once | INTRAMUSCULAR | Status: AC
  Administered 2014-08-13: 4 mg via INTRAVENOUS

## 2014-08-13 MED ORDER — LIDOCAINE HCL (CARDIAC) 10 MG/ML IV SOLN
INTRAVENOUS | Status: DC | PRN
  Administered 2014-08-13: 50 mg via INTRAVENOUS

## 2014-08-13 MED ORDER — LIDOCAINE VISCOUS 2 % MT SOLN
4.0000 mL | Freq: Once | OROMUCOSAL | Status: AC
  Administered 2014-08-13: 4 mL via OROMUCOSAL

## 2014-08-13 MED ORDER — STERILE WATER FOR IRRIGATION IR SOLN
Status: DC | PRN
  Administered 2014-08-13: 10:00:00

## 2014-08-13 MED ORDER — ONDANSETRON HCL 4 MG/2ML IJ SOLN
INTRAMUSCULAR | Status: AC
  Filled 2014-08-13: qty 2

## 2014-08-13 MED ORDER — LIDOCAINE VISCOUS 2 % MT SOLN
OROMUCOSAL | Status: AC
  Filled 2014-08-13: qty 15

## 2014-08-13 SURGICAL SUPPLY — 36 items
BALLN CRE LF 10-12 240X5.5 (BALLOONS)
BALLN CRE LF 10-12MM 240X5.5 (BALLOONS)
BALLN DILATOR CRE 12-15 240 (BALLOONS)
BALLN DILATOR CRE 15-18 240 (BALLOONS) IMPLANT
BALLN DILATOR CRE 18-20 240 (BALLOONS) IMPLANT
BALLN DILATOR CRE WIREGUIDE (BALLOONS)
BALLOON CRE LF 10-12 240X5.5 (BALLOONS) IMPLANT
BALLOON DILATOR CRE 12-15 240 (BALLOONS) IMPLANT
BALLOON DILATOR CRE WIREGUIDE (BALLOONS) IMPLANT
BLOCK BITE 60FR ADLT L/F BLUE (MISCELLANEOUS) IMPLANT
DEVICE CLIP HEMOSTAT 235CM (CLIP) IMPLANT
ELECT REM PT RETURN 9FT ADLT (ELECTROSURGICAL) ×3
ELECTRODE REM PT RTRN 9FT ADLT (ELECTROSURGICAL) ×1 IMPLANT
FCP BXJMBJMB 240X2.8X (CUTTING FORCEPS)
FLOOR PAD 36X40 (MISCELLANEOUS) ×3
FORCEPS BIOP RAD 4 LRG CAP 4 (CUTTING FORCEPS) ×3 IMPLANT
FORCEPS BIOP RJ4 240 W/NDL (CUTTING FORCEPS)
FORCEPS BXJMBJMB 240X2.8X (CUTTING FORCEPS) IMPLANT
FORMALIN 10 PREFIL 20ML (MISCELLANEOUS) ×9 IMPLANT
INJECTOR/SNARE I SNARE (MISCELLANEOUS) IMPLANT
KIT CLEAN ENDO COMPLIANCE (KITS) ×3 IMPLANT
LUBRICANT JELLY 4.5OZ STERILE (MISCELLANEOUS) ×3 IMPLANT
MANIFOLD NEPTUNE II (INSTRUMENTS) ×3 IMPLANT
NEEDLE SCLEROTHERAPY 25GX240 (NEEDLE) IMPLANT
PAD FLOOR 36X40 (MISCELLANEOUS) ×1 IMPLANT
PROBE APC STR FIRE (PROBE) IMPLANT
PROBE INJECTION GOLD (MISCELLANEOUS)
PROBE INJECTION GOLD 7FR (MISCELLANEOUS) IMPLANT
SNARE ROTATE MED OVAL 20MM (MISCELLANEOUS) ×3 IMPLANT
SNARE SHORT THROW 13M SML OVAL (MISCELLANEOUS) IMPLANT
SYR 50ML LL SCALE MARK (SYRINGE) ×3 IMPLANT
SYR INFLATE BILIARY GAUGE (MISCELLANEOUS) IMPLANT
SYR INFLATION 60ML (SYRINGE) ×3 IMPLANT
TRAP SPECIMEN MUCOUS 40CC (MISCELLANEOUS) IMPLANT
TUBING IRRIGATION ENDOGATOR (MISCELLANEOUS) ×3 IMPLANT
WATER STERILE IRR 1000ML POUR (IV SOLUTION) ×3 IMPLANT

## 2014-08-13 NOTE — Interval H&P Note (Signed)
History and Physical Interval Note:  08/13/2014 10:00 AM  Ashley Patrick  has presented today for surgery, with the diagnosis of recent colon cancer, dysphagia  The various methods of treatment have been discussed with the patient and family. After consideration of risks, benefits and other options for treatment, the patient has consented to  Procedure(s): ESOPHAGOGASTRODUODENOSCOPY (EGD) WITH PROPOFOL (N/A) COLONOSCOPY WITH PROPOFOL (N/A) ESOPHAGEAL DILATION (N/A) as a surgical intervention .  The patient's history has been reviewed, patient examined, no change in status, stable for surgery.  I have reviewed the patient's chart and labs.  Questions were answered to the patient's satisfaction.     Ashley Patrick  No change. EGD with possible Salter dilation and surveillance colonoscopy per plan.The risks, benefits, limitations, imponderables and alternatives regarding both EGD and colonoscopy have been reviewed with the patient. Questions have been answered. All parties agreeable.

## 2014-08-13 NOTE — Transfer of Care (Signed)
Immediate Anesthesia Transfer of Care Note  Patient: Ashley Patrick  Procedure(s) Performed: Procedure(s): ESOPHAGOGASTRODUODENOSCOPY (EGD) WITH PROPOFOL (N/A) ESOPHAGEAL MALONEY DILATION 21 FRENCH (N/A) COLONOSCOPY WITH PROPOFOL; IN CECUM AT 1044; WITHDRAWAL TIME 14 MINUTES (N/A) POLYPECTOMY (N/A) BIOPSY (N/A)  Patient Location: PACU  Anesthesia Type:MAC  Level of Consciousness: awake, alert  and oriented  Airway & Oxygen Therapy: Patient Spontanous Breathing  Post-op Assessment: Report given to RN  Post vital signs: Reviewed and stable  Last Vitals:  Filed Vitals:   08/13/14 1000  BP: 144/97  Pulse:   Temp:   Resp: 0    Complications: No apparent anesthesia complications

## 2014-08-13 NOTE — Anesthesia Postprocedure Evaluation (Signed)
  Anesthesia Post-op Note  Patient: Ashley Patrick  Procedure(s) Performed: Procedure(s): ESOPHAGOGASTRODUODENOSCOPY (EGD) WITH PROPOFOL (N/A) ESOPHAGEAL MALONEY DILATION 17 FRENCH (N/A) COLONOSCOPY WITH PROPOFOL; IN CECUM AT 1044; WITHDRAWAL TIME 14 MINUTES (N/A) POLYPECTOMY (N/A) BIOPSY (N/A)  Patient Location: PACU  Anesthesia Type:MAC  Level of Consciousness: awake, alert  and oriented  Airway and Oxygen Therapy: Patient Spontanous Breathing  Post-op Pain: none  Post-op Assessment: Post-op Vital signs reviewed, Patient's Cardiovascular Status Stable, Respiratory Function Stable, Patent Airway and No signs of Nausea or vomiting  Post-op Vital Signs: Reviewed and stable  Last Vitals:  Filed Vitals:   08/13/14 1110  BP:   Pulse:   Temp: 36.5 C  Resp:     Complications: No apparent anesthesia complications

## 2014-08-13 NOTE — Anesthesia Procedure Notes (Signed)
Procedure Name: MAC Date/Time: 08/13/2014 10:10 AM Performed by: Andree Elk, Ziah Leandro A Pre-anesthesia Checklist: Patient identified, Timeout performed, Emergency Drugs available, Suction available and Patient being monitored Oxygen Delivery Method: Simple face mask

## 2014-08-13 NOTE — Discharge Instructions (Signed)
Colonoscopy Discharge Instructions  Read the instructions outlined below and refer to this sheet in the next few weeks. These discharge instructions provide you with general information on caring for yourself after you leave the hospital. Your doctor may also give you specific instructions. While your treatment has been planned according to the most current medical practices available, unavoidable complications occasionally occur. If you have any problems or questions after discharge, call Dr. Gala Romney at [phone]. ACTIVITY  You may resume your regular activity, but move at a slower pace for the next 24 hours.   Take frequent rest periods for the next 24 hours.   Walking will help get rid of the air and reduce the bloated feeling in your belly (abdomen).   No driving for 24 hours (because of the medicine (anesthesia) used during the test).    Do not sign any important legal documents or operate any machinery for 24 hours (because of the anesthesia used during the test).  NUTRITION  Drink plenty of fluids.   You may resume your normal diet as instructed by your doctor.   Begin with a light meal and progress to your normal diet. Heavy or fried foods are harder to digest and may make you feel sick to your stomach (nauseated).   Avoid alcoholic beverages for 24 hours or as instructed.  MEDICATIONS  You may resume your normal medications unless your doctor tells you otherwise.  WHAT YOU CAN EXPECT TODAY  Some feelings of bloating in the abdomen.   Passage of more gas than usual.   Spotting of blood in your stool or on the toilet paper.  IF YOU HAD POLYPS REMOVED DURING THE COLONOSCOPY:  No aspirin products for 7 days or as instructed.   No alcohol for 7 days or as instructed.   Eat a soft diet for the next 24 hours.  FINDING OUT THE RESULTS OF YOUR TEST Not all test results are available during your visit. If your test results are not back during the visit, make an appointment  with your caregiver to find out the results. Do not assume everything is normal if you have not heard from your caregiver or the medical facility. It is important for you to follow up on all of your test results.  SEEK IMMEDIATE MEDICAL ATTENTION IF:  You have more than a spotting of blood in your stool.   Your belly is swollen (abdominal distention).   You are nauseated or vomiting.   You have a temperature over 101.  You have abdominal pain or discomfort that is severe or gets worse throughout the day. EGD Discharge instructions Please read the instructions outlined below and refer to this sheet in the next few weeks. These discharge instructions provide you with general information on caring for yourself after you leave the hospital. Your doctor may also give you specific instructions. While your treatment has been planned according to the most current medical practices available, unavoidable complications occasionally occur. If you have any problems or questions after discharge, please call your doctor. ACTIVITY You may resume your regular activity but move at a slower pace for the next 24 hours.  Take frequent rest periods for the next 24 hours.  Walking will help expel (get rid of) the air and reduce the bloated feeling in your abdomen.  No driving for 24 hours (because of the anesthesia (medicine) used during the test).  You may shower.  Do not sign any important legal documents or operate any machinery for 24  hours (because of the anesthesia used during the test).  NUTRITION Drink plenty of fluids.  You may resume your normal diet.  Begin with a light meal and progress to your normal diet.  Avoid alcoholic beverages for 24 hours or as instructed by your caregiver.  MEDICATIONS You may resume your normal medications unless your caregiver tells you otherwise.  WHAT YOU CAN EXPECT TODAY You may experience abdominal discomfort such as a feeling of fullness or gas pains.   FOLLOW-UP Your doctor will discuss the results of your test with you.  SEEK IMMEDIATE MEDICAL ATTENTION IF ANY OF THE FOLLOWING OCCUR: Excessive nausea (feeling sick to your stomach) and/or vomiting.  Severe abdominal pain and distention (swelling).  Trouble swallowing.  Temperature over 101 F (37.8 C).  Rectal bleeding or vomiting of blood.      Trial of Dexilant 60 mg daily-go by my office for 3 week supply free samples  Diverticulosis and polyps information provided  Further recommendations to follow pending review of pathology report Office visit with [place] in 4 weeks    Diverticulosis Diverticulosis is the condition that develops when small pouches (diverticula) form in the wall of your colon. Your colon, or large intestine, is where water is absorbed and stool is formed. The pouches form when the inside layer of your colon pushes through weak spots in the outer layers of your colon. CAUSES  No one knows exactly what causes diverticulosis. RISK FACTORS Being older than 61. Your risk for this condition increases with age. Diverticulosis is rare in people younger than 40 years. By age 69, almost everyone has it. Eating a low-fiber diet. Being frequently constipated. Being overweight. Not getting enough exercise. Smoking. Taking over-the-counter pain medicines, like aspirin and ibuprofen. SYMPTOMS  Most people with diverticulosis do not have symptoms. DIAGNOSIS  Because diverticulosis often has no symptoms, health care providers often discover the condition during an exam for other colon problems. In many cases, a health care provider will diagnose diverticulosis while using a flexible scope to examine the colon (colonoscopy). TREATMENT  If you have never developed an infection related to diverticulosis, you may not need treatment. If you have had an infection before, treatment may include: Eating more fruits, vegetables, and grains. Taking a fiber supplement. Taking a  live bacteria supplement (probiotic). Taking medicine to relax your colon. HOME CARE INSTRUCTIONS  Drink at least 6-8 glasses of water each day to prevent constipation. Try not to strain when you have a bowel movement. Keep all follow-up appointments. If you have had an infection before: Increase the fiber in your diet as directed by your health care provider or dietitian. Take a dietary fiber supplement if your health care provider approves. Only take medicines as directed by your health care provider. SEEK MEDICAL CARE IF:  You have abdominal pain. You have bloating. You have cramps. You have not gone to the bathroom in 3 days. SEEK IMMEDIATE MEDICAL CARE IF:  Your pain gets worse. Yourbloating becomes very bad. You have a fever or chills, and your symptoms suddenly get worse. You begin vomiting. You have bowel movements that are bloody or black. MAKE SURE YOU: Understand these instructions. Will watch your condition. Will get help right away if you are not doing well or get worse. Document Released: 03/02/2004 Document Revised: 06/10/2013 Document Reviewed: 04/30/2013 [facility] Patient Information 2015 [place], [place]. This information is not intended to replace advice given to you by your health care provider. Make sure you discuss any questions you have  with your health care provider. Colon Polyps Polyps are lumps of extra tissue growing inside the body. Polyps can grow in the large intestine (colon). Most colon polyps are noncancerous (benign). However, some colon polyps can become cancerous over time. Polyps that are larger than a pea may be harmful. To be safe, caregivers remove and test all polyps. CAUSES  Polyps form when mutations in the genes cause your cells to grow and divide even though no more tissue is needed. RISK FACTORS There are a number of risk factors that can increase your chances of getting colon polyps. They include: Being older than 50 years. Family  history of colon polyps or colon cancer. Long-term colon diseases, such as colitis or Crohn disease. Being overweight. Smoking. Being inactive. Drinking too much alcohol. SYMPTOMS  Most small polyps do not cause symptoms. If symptoms are present, they may include: Blood in the stool. The stool may look dark red or black. Constipation or diarrhea that lasts longer than 1 week. DIAGNOSIS People often do not know they have polyps until their caregiver finds them during a regular checkup. Your caregiver can use 4 tests to check for polyps: Digital rectal exam. The caregiver wears gloves and feels inside the rectum. This test would find polyps only in the rectum. Barium enema. The caregiver puts a liquid called barium into your rectum before taking X-rays of your colon. Barium makes your colon look white. Polyps are dark, so they are easy to see in the X-ray pictures. Sigmoidoscopy. A thin, flexible tube (sigmoidoscope) is placed into your rectum. The sigmoidoscope has a light and tiny camera in it. The caregiver uses the sigmoidoscope to look at the last third of your colon. Colonoscopy. This test is like sigmoidoscopy, but the caregiver looks at the entire colon. This is the most common method for finding and removing polyps. TREATMENT  Any polyps will be removed during a sigmoidoscopy or colonoscopy. The polyps are then tested for cancer. PREVENTION  To help lower your risk of getting more colon polyps: Eat plenty of fruits and vegetables. Avoid eating fatty foods. Do not smoke. Avoid drinking alcohol. Exercise every day. Lose weight if recommended by your caregiver. Eat plenty of calcium and folate. Foods that are rich in calcium include milk, cheese, and broccoli. Foods that are rich in folate include chickpeas, kidney beans, and spinach. HOME CARE INSTRUCTIONS Keep all follow-up appointments as directed by your caregiver. You may need periodic exams to check for polyps. SEEK MEDICAL CARE  IF: You notice bleeding during a bowel movement. Document Released: 03/01/2004 Document Revised: 08/28/2011 Document Reviewed: 08/15/2011 [facility] Patient Information 2015 [place], [place]. This information is not intended to replace advice given to you by your health care provider. Make sure you discuss any questions you have with your health care provider.

## 2014-08-13 NOTE — Anesthesia Preprocedure Evaluation (Addendum)
Anesthesia Evaluation  Patient identified by MRN, date of birth, ID band Patient awake    Reviewed: Allergy & Precautions, H&P , NPO status , Patient's Chart, lab work & pertinent test results  History of Anesthesia Complications (+) PONV and history of anesthetic complications  Airway Mallampati: I  TM Distance: >3 FB     Dental  (+) Teeth Intact   Pulmonary sleep apnea , Current Smoker, former smoker,  breath sounds clear to auscultation        Cardiovascular hypertension, Pt. on medications Rhythm:Regular Rate:Normal     Neuro/Psych  Headaches, PSYCHIATRIC DISORDERS Depression Bipolar Disorder    GI/Hepatic   Endo/Other    Renal/GU      Musculoskeletal   Abdominal   Peds  Hematology   Anesthesia Other Findings   Reproductive/Obstetrics                            Anesthesia Physical Anesthesia Plan  ASA: III  Anesthesia Plan: MAC   Post-op Pain Management:    Induction: Intravenous  Airway Management Planned: Simple Face Mask  Additional Equipment:   Intra-op Plan:   Post-operative Plan:   Informed Consent: I have reviewed the patients History and Physical, chart, labs and discussed the procedure including the risks, benefits and alternatives for the proposed anesthesia with the patient or authorized representative who has indicated his/her understanding and acceptance.     Plan Discussed with:   Anesthesia Plan Comments:        Anesthesia Quick Evaluation

## 2014-08-13 NOTE — Op Note (Signed)
NAME:  Ashley Patrick, Ashley Patrick                ACCOUNT NO.:  [id number]  MEDICAL RECORD NO.:  [id number]  LOCATION:  [facility]                          FACILITY:  [facility]  PHYSICIAN:  R. Garfield Cornea, MD [place]:  1956/06/10  DATE OF PROCEDURE:  08/13/2014 DATE OF DISCHARGE:                              OPERATIVE REPORT   PROCEDURE:  Colonoscopy with snare polypectomy biopsy.  INDICATIONS FOR PROCEDURE:  The patient is a 59 year old, status post sigmoid colectomy for colorectal cancer 2 years ago.  She comes for her 1st postoperative surveillance examination.  Risks, benefits, limitations, alternatives, imponderables have been discussed.  Questions have been answered.  Please see the documentation in the medical record. The EGD was just performed.  COLONOSCOPY FINDINGS:  Deep sedation continued by Dr. Duwayne Heck and associates.  INSTRUMENT:  Pentax video chip system.  PROCEDURE NOTE:  Digital rectal exam revealed no abnormalities. Endoscopic findings, prep was adequate.  Exam of  rectal mucosa including retroflexed view of the anal verge demonstrated no abnormalities.  The patient's colon was redundant and elongated requiring changing of the patient's position and external abdominal pressure to reach the cecum.  At 25 cm, surgical anastomosis was identified. There was an 8 mm pedunculated multilobulated polyp at this level with an adjacent diminutive polyp.  At the splenic flexure, there were 2 more 5 mm polyps and there was 1 in the ascending segment; the patient also had scattered pancolonic diverticula.  Remainder of colonic mucosa appeared normal.  The descending polyp was cold biopsy removed. The splenic flexure polyps were cold snare removed.  The pedunculated polyp at the anastomosis was hot snare removed.  The adjacent diminutive polyp was cold biopsy removed.  The patient tolerated the procedure well.  Cecal withdrawal time 12 minutes.  IMPRESSION:  Status post sigmoid  colectomy.  Multiple colonic polyps removed as described above.  Redundant colon.  Pancolonic diverticulosis.  RECOMMENDATIONS: 1. Follow up on pathology. 2. See EGD report.     Bridgette Habermann, MD [name]     RMR/MEDQ  D:  08/13/2014  T:  08/13/2014  Job:  [id number]

## 2014-08-13 NOTE — Addendum Note (Signed)
Addendum  created 08/13/14 1131 by Lerry Liner, MD   Modules edited: Clinical Notes   Clinical Notes:  File: [id number]

## 2014-08-13 NOTE — Op Note (Signed)
NAME:  Ashley Patrick, Ashley Patrick                ACCOUNT NO.:  [id number]  MEDICAL RECORD NO.:  [id number]  LOCATION:  [facility]                          FACILITY:  [facility]  PHYSICIAN:  R. Garfield Cornea, MD [place]:  23-May-1956  DATE OF PROCEDURE:  08/13/2014 DATE OF DISCHARGE:                              OPERATIVE REPORT   PROCEDURE:  EGD with Venia Minks dilation.  INDICATIONS FOR PROCEDURE:  A 59 year old lady with postprandial epigastric pain, cholecystectomy did not help.  Intermittent esophageal dysphagia.  EGD now being done.  Risks, benefits, limitations, alternatives, imponderables have been discussed, questions answered. Please see the documentation medical record.  PROCEDURE NOTE:  O2 saturation, blood pressure, pulse, respirations were monitored throughout the entire procedure.  Deep sedation provided by Dr. Duwayne Heck and [facility].  INSTRUMENTATION:  Pentax video chip system.  FINDINGS:  Examination of tubular esophagus revealed (3) 4 to 5 mm submucosal cystic lesions, distal esophagus.  These appeared to be benign and not vascular.  Tubular esophagus patent throughout its course.  Mucosa was normal, otherwise.  Stomach:  Minimal retained gastric contents noted (last intake of solid food reportedly yesterday morning at 10 a.m.) this was easily washed out.  Thorough examination of the gastric mucosa including retroflexed view of the proximal stomach, esophagogastric junction demonstrated only a small hiatal hernia.  Pylorus patent.  Examination of bulb and second portion revealed no abnormalities.  THERAPEUTIC/DIAGNOSTIC MANEUVERS PERFORMED:  Scope was withdrawn.  A 54- French Maloney dilator was passed to full insertion easily.  A look back revealed no apparent complication related to this maneuver. The patient tolerated the procedure welland was prepared for colonoscopy.  IMPRESSION:  Small benign cystic-appearing lesion in distal esophagus of doubtful clinical  significance, otherwise normal esophagus, status post Maloney dilation.  Small hiatal hernia, some retained gastric contents (query delayed gastric emptying).  RECOMMENDATIONS: 1. Trial of Dexilant 60 mg daily-the patient to go by my office for     free samples. 2. Office followup in 1 month. 3. See colonoscopy report.     Bridgette Habermann, MD [name]     RMR/MEDQ  D:  08/13/2014  T:  08/13/2014  Job:  [id number]

## 2014-08-13 NOTE — H&P (View-Only) (Signed)
Referring Provider: Yevette Edwards, NP Primary Care Physician:  Yevette Edwards, NP Primary GI: Dr. Gala Romney  Chief Complaint  Patient presents with  . Follow-up    HPI:   59 year old female presents for recall list of 1 year follow-up colonoscopy. 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 Had a partial colectomy 06/23/13 which she tolerated well. Tumor staged at T2, N0, M0. Seen by oncology 07/21/13 with no additional treatment indicated, and colonoscopy 1 year and if no polyps repeat every 3 years. No follow-up appt with oncology made. Seen here in 09/2013 for abdominal pain and recommended follow-up with surgeon.  Today she states she's been doing pretty well since April. Occasionally become quite constipated for which she takes magnesium citrate which relieves her symptoms. She typically has to do this about one every 1-2 months. Has tried Miralax but only when she's already constipated. Has also noted bright red blood with bowel movements typically only when she's constipated and is limited to the toilet tissue. Admits occasional intermittent abdominal pain. Has not been back to see surgeon. Denies melena, N/V. Admits GERD symptoms rarely. Has solid food dysphagia 2-3 times a month where she feels food gets stuck in the esophagus with pain and will occasionally burp which caused the food to regurgitate into her mouth. Her dysphagia symptoms are new since her last EGD in July of 2014. Does not take a PPI currently. Denies NSAID use or ASA powder use. Denies any other upper or lower GI symptoms.  Past Medical History  Diagnosis Date  . Back pain   . Obesity     History of  . Nicotine addiction   . Psychosis   . Hypertension   . OD (overdose of drug)     hospitalized for 11 days   . Bipolar 1 disorder   . Hypercholesteremia   . Migraine headache   . Lateral epicondylitis  of  elbow     right  . Cancer     Colon Cancer    Past Surgical History  Procedure Laterality Date  . Partial hysterectomy    . Breast biopsy  10/18/2011    Procedure: BREAST BIOPSY;  Surgeon: Jamesetta So, MD;  Location: [facility];  Service: General;  Laterality: Left;  . Esophagogastroduodenoscopy N/A 12/26/2012    QQI:WLNLGXQ reflux esophagitis. Gastric and duodenal bulbar erosions-status post gastric biopsynegative H.pylori  . Abdominal hysterectomy    . Colonoscopy    . Colonoscopy N/A 05/29/2013    [facility]:ERDEYCX mass most likely representing colorectal cancer S/ P biospy.Multiple colonic and rectal polyps removed/treated as described above. Colonic diverticulosis  . Colon resection N/A 06/23/2013    Procedure: HAND ASSISTED LAPAROSCOPIC PARTIAL COLECTOMY;  Surgeon: Jamesetta So, MD;  Location: [facility];  Service: General;  Laterality: N/A;    Current Outpatient Prescriptions  Medication Sig Dispense Refill  . amLODipine-benazepril (LOTREL) 5-10 MG per capsule Take 1 capsule by mouth daily.    . clonazePAM (KLONOPIN) 1 MG tablet Take 1 mg by mouth 2 (two) times daily.    . hydrochlorothiazide (MICROZIDE) 12.5 MG capsule Take 12.5 mg by mouth daily.    Marland Kitchen lovastatin (MEVACOR) 20 MG tablet Take 20 mg by mouth at bedtime.    . meloxicam (MOBIC) 7.5 MG tablet Take 7.5 mg by mouth daily.    . mirabegron ER (MYRBETRIQ) 50 MG TB24 tablet Take 50  mg by mouth daily.    . Multiple Vitamin (MULTIVITAMIN WITH MINERALS) TABS tablet Take 1 tablet by mouth daily.    . risperiDONE (RISPERDAL) 0.5 MG tablet Take 1 tablet (0.5 mg total) by mouth 2 (two) times daily. 60 tablet 0  . sertraline (ZOLOFT) 100 MG tablet Take 100 mg by mouth daily.    Marland Kitchen zolpidem (AMBIEN) 10 MG tablet Take 10 mg by mouth every other day.    . ibuprofen (ADVIL,MOTRIN) 200 MG tablet Take 400 mg by mouth every 8 (eight) hours as needed for fever, headache or moderate pain.      No current facility-administered medications for this  visit.    Allergies as of 07/20/2014  . (No Known Allergies)    Family History  Problem Relation Age of Onset  . Anesthesia problems Neg Hx   . Hypotension Neg Hx   . Malignant hyperthermia Neg Hx   . Pseudochol deficiency Neg Hx   . Colon cancer Father     diagnosed in his 30s    History   Social History  . Marital Status: Legally Separated    Spouse Name: N/A    Number of Children: 3  . Years of Education: N/A   Social History Main Topics  . Smoking status: Current Every Day Smoker -- 0.50 packs/day for 38 years    Types: Cigarettes  . Smokeless tobacco: Never Used     Comment: vapor only  . Alcohol Use: No  . Drug Use: No  . Sexual Activity: Yes    Birth Control/ Protection: Surgical   Other Topics Concern  . None   Social History Narrative    Review of Systems: Gen: Denies fever, chills, anorexia. Denies fatigue, weakness, weight loss.  CV: Denies chest pain, palpitations, syncope, peripheral edema, and claudication. Resp: Denies dyspnea at rest, cough, wheezing, coughing up blood, and pleurisy. GI: See HPI. Derm: Denies rash, itching, dry skin Psych: Denies depression, anxiety, memory loss, confusion.  Heme: Denies bruising, bleeding, and enlarged lymph nodes.  Physical Exam: BP 126/84 mmHg  Pulse 105  Temp(Src) 97 F (36.1 C)  Ht 5\' 6"  (1.676 m)  Wt 213 lb 3.2 oz (96.707 kg)  BMI 34.43 kg/m2 General:   Alert and oriented. No distress noted. Pleasant and cooperative.  Head:  Normocephalic and atraumatic. Eyes:  Conjuctiva clear without scleral icterus. Mouth:  Oral mucosa pink and moist. Good dentition. No lesions. Neck:  Supple, without mass or thyromegaly. Lungs:  Clear to auscultation bilaterally. No wheezes, rales, or rhonchi. No distress.  Heart:  S1, S2 present without murmurs, rubs, or gallops. Regular rate and rhythm. Abdomen:  +BS, soft, non-tender and non-distended. No rebound or guarding. No HSM or masses noted. Msk:  Symmetrical  without gross deformities. Normal posture. Pulses:  2+ DP noted bilaterally Extremities:  Without edema. Neurologic:  Alert and  oriented x4;  grossly normal neurologically. Skin:  Intact without significant lesions or rashes. Cervical Nodes:  No significant cervical adenopathy. Psych:  Alert and cooperative. Normal mood and affect.    07/20/2014 1:48 PM

## 2014-08-14 ENCOUNTER — Encounter (HOSPITAL_COMMUNITY): Payer: Self-pay | Admitting: Internal Medicine

## 2014-08-17 ENCOUNTER — Encounter: Payer: Self-pay | Admitting: Internal Medicine

## 2014-09-10 ENCOUNTER — Telehealth: Payer: Self-pay | Admitting: Internal Medicine

## 2014-09-10 ENCOUNTER — Encounter: Payer: Self-pay | Admitting: Gastroenterology

## 2014-09-10 ENCOUNTER — Ambulatory Visit: Payer: Medicare Other | Admitting: Gastroenterology

## 2014-09-10 NOTE — Telephone Encounter (Signed)
PATIENT WAS A NO SHOW AND LETTER WAS SENT  °

## 2014-09-11 ENCOUNTER — Other Ambulatory Visit: Payer: Self-pay | Admitting: Nurse Practitioner

## 2014-09-14 NOTE — Telephone Encounter (Signed)
Called pt. She never picked up samples and she missed her follow up appt.  Pt is aware to come pick up samples and has another office visit scheduled for the end of April.

## 2014-09-14 NOTE — Telephone Encounter (Signed)
Please call the patient and ask her if she's picked up the samples of Dexilant. Per RMR last EGD note she was to switch to Dexilant 60 mg daily with samples from [place] and then presumably a follow-up Rx at her 1 month follow-up. She missed her follow-up, however.

## 2014-09-23 ENCOUNTER — Other Ambulatory Visit: Payer: Self-pay | Admitting: Nurse Practitioner

## 2014-10-12 ENCOUNTER — Telehealth: Payer: Self-pay | Admitting: Internal Medicine

## 2014-10-12 ENCOUNTER — Encounter: Payer: Self-pay | Admitting: Nurse Practitioner

## 2014-10-12 ENCOUNTER — Ambulatory Visit: Payer: Medicare Other | Admitting: Nurse Practitioner

## 2014-10-12 NOTE — Telephone Encounter (Signed)
PATIENT WAS  A NO SHOW 10/12/14 AND LETTER WAS SENT

## 2014-10-13 NOTE — Telephone Encounter (Signed)
Noted  

## 2014-11-04 ENCOUNTER — Encounter (HOSPITAL_COMMUNITY): Payer: Self-pay | Admitting: Emergency Medicine

## 2014-11-04 ENCOUNTER — Emergency Department (HOSPITAL_COMMUNITY)
Admission: EM | Admit: 2014-11-04 | Discharge: 2014-11-04 | Disposition: A | Discharge status: Home / Self Care | Payer: Medicare Other | Attending: Emergency Medicine | Admitting: Emergency Medicine

## 2014-11-04 ENCOUNTER — Emergency Department (HOSPITAL_COMMUNITY): Payer: Medicare Other

## 2014-11-04 DIAGNOSIS — S29092A Other injury of muscle and tendon of back wall of thorax, initial encounter: Secondary | ICD-10-CM | POA: Diagnosis not present

## 2014-11-04 DIAGNOSIS — G43909 Migraine, unspecified, not intractable, without status migrainosus: Secondary | ICD-10-CM | POA: Insufficient documentation

## 2014-11-04 DIAGNOSIS — M545 Low back pain, unspecified: Secondary | ICD-10-CM

## 2014-11-04 DIAGNOSIS — E669 Obesity, unspecified: Secondary | ICD-10-CM | POA: Insufficient documentation

## 2014-11-04 DIAGNOSIS — E780 Pure hypercholesterolemia: Secondary | ICD-10-CM | POA: Diagnosis not present

## 2014-11-04 DIAGNOSIS — S59902A Unspecified injury of left elbow, initial encounter: Secondary | ICD-10-CM | POA: Diagnosis present

## 2014-11-04 DIAGNOSIS — M546 Pain in thoracic spine: Secondary | ICD-10-CM

## 2014-11-04 DIAGNOSIS — S5002XA Contusion of left elbow, initial encounter: Secondary | ICD-10-CM | POA: Insufficient documentation

## 2014-11-04 DIAGNOSIS — Y93E1 Activity, personal bathing and showering: Secondary | ICD-10-CM | POA: Insufficient documentation

## 2014-11-04 DIAGNOSIS — W19XXXA Unspecified fall, initial encounter: Secondary | ICD-10-CM

## 2014-11-04 DIAGNOSIS — Y998 Other external cause status: Secondary | ICD-10-CM | POA: Insufficient documentation

## 2014-11-04 DIAGNOSIS — I10 Essential (primary) hypertension: Secondary | ICD-10-CM | POA: Diagnosis not present

## 2014-11-04 DIAGNOSIS — Z87891 Personal history of nicotine dependence: Secondary | ICD-10-CM | POA: Insufficient documentation

## 2014-11-04 DIAGNOSIS — Z85038 Personal history of other malignant neoplasm of large intestine: Secondary | ICD-10-CM | POA: Insufficient documentation

## 2014-11-04 DIAGNOSIS — Z79899 Other long term (current) drug therapy: Secondary | ICD-10-CM | POA: Diagnosis not present

## 2014-11-04 DIAGNOSIS — Y92091 Bathroom in other non-institutional residence as the place of occurrence of the external cause: Secondary | ICD-10-CM | POA: Insufficient documentation

## 2014-11-04 DIAGNOSIS — S0990XA Unspecified injury of head, initial encounter: Secondary | ICD-10-CM | POA: Diagnosis not present

## 2014-11-04 DIAGNOSIS — S3992XA Unspecified injury of lower back, initial encounter: Secondary | ICD-10-CM | POA: Insufficient documentation

## 2014-11-04 DIAGNOSIS — Z791 Long term (current) use of non-steroidal anti-inflammatories (NSAID): Secondary | ICD-10-CM | POA: Diagnosis not present

## 2014-11-04 DIAGNOSIS — F319 Bipolar disorder, unspecified: Secondary | ICD-10-CM | POA: Diagnosis not present

## 2014-11-04 DIAGNOSIS — W01198A Fall on same level from slipping, tripping and stumbling with subsequent striking against other object, initial encounter: Secondary | ICD-10-CM | POA: Diagnosis not present

## 2014-11-04 LAB — URINALYSIS, ROUTINE W REFLEX MICROSCOPIC
BILIRUBIN URINE: NEGATIVE
Glucose, UA: NEGATIVE mg/dL
KETONES UR: NEGATIVE mg/dL
NITRITE: NEGATIVE
Protein, ur: NEGATIVE mg/dL
Specific Gravity, Urine: 1.01 (ref 1.005–1.030)
UROBILINOGEN UA: 0.2 mg/dL (ref 0.0–1.0)
pH: 5.5 (ref 5.0–8.0)

## 2014-11-04 LAB — URINE MICROSCOPIC-ADD ON

## 2014-11-04 MED ORDER — ONDANSETRON HCL 4 MG/2ML IJ SOLN
4.0000 mg | Freq: Once | INTRAMUSCULAR | Status: AC
  Administered 2014-11-04: 4 mg via INTRAVENOUS
  Filled 2014-11-04: qty 2

## 2014-11-04 MED ORDER — HYDROCODONE-ACETAMINOPHEN 5-325 MG PO TABS: ORAL_TABLET | ORAL | Status: DC

## 2014-11-04 MED ORDER — KETOROLAC TROMETHAMINE 30 MG/ML IJ SOLN
30.0000 mg | Freq: Once | INTRAMUSCULAR | Status: AC
  Administered 2014-11-04: 30 mg via INTRAVENOUS
  Filled 2014-11-04: qty 1

## 2014-11-04 MED ORDER — METHOCARBAMOL 500 MG PO TABS: 500.0000 mg | ORAL_TABLET | Freq: Three times a day (TID) | ORAL | Status: DC

## 2014-11-04 MED ORDER — MORPHINE SULFATE 4 MG/ML IJ SOLN
4.0000 mg | Freq: Once | INTRAMUSCULAR | Status: AC
  Administered 2014-11-04: 4 mg via INTRAVENOUS
  Filled 2014-11-04: qty 1

## 2014-11-04 NOTE — ED Notes (Signed)
Pt states she fell in shower on Sunday. Pt states she is unsure of how she fell and unsure of loc prior to fall. States she hit her head/back/left elbow. Denies loc after fall. Pt c/o continued back pain today. nad noted.

## 2014-11-04 NOTE — Discharge Instructions (Signed)
Back Pain, Adult °Back pain is very common. The pain often gets better over time. The cause of back pain is usually not dangerous. Most people can learn to manage their back pain on their own.  °HOME CARE  °· Stay active. Start with short walks on flat ground if you can. Try to walk farther each day. °· Do not sit, drive, or stand in one place for more than 30 minutes. Do not stay in bed. °· Do not avoid exercise or work. Activity can help your back heal faster. °· Be careful when you bend or lift an object. Bend at your knees, keep the object close to you, and do not twist. °· Sleep on a firm mattress. Lie on your side, and bend your knees. If you lie on your back, put a pillow under your knees. °· Only take medicines as told by your doctor. °· Put ice on the injured area. °· Put ice in a plastic bag. °· Place a towel between your skin and the bag. °· Leave the ice on for 15-20 minutes, 03-04 times a day for the first 2 to 3 days. After that, you can switch between ice and heat packs. °· Ask your doctor about back exercises or massage. °· Avoid feeling anxious or stressed. Find good ways to deal with stress, such as exercise. °GET HELP RIGHT AWAY IF:  °· Your pain does not go away with rest or medicine. °· Your pain does not go away in 1 week. °· You have new problems. °· You do not feel well. °· The pain spreads into your legs. °· You cannot control when you poop (bowel movement) or pee (urinate). °· Your arms or legs feel weak or lose feeling (numbness). °· You feel sick to your stomach (nauseous) or throw up (vomit). °· You have belly (abdominal) pain. °· You feel like you may pass out (faint). °MAKE SURE YOU:  °· Understand these instructions. °· Will watch your condition. °· Will get help right away if you are not doing well or get worse. °Document Released: 11/22/2007 Document Revised: 08/28/2011 Document Reviewed: 10/07/2013 °ExitCare® Patient Information ©2015 [employer]. This information is not intended  to replace advice given to you by your health care provider. Make sure you discuss any questions you have with your health care provider. ° °Fall Prevention and Home Safety °Falls cause injuries and can affect all age groups. It is possible to prevent falls.  °HOW TO PREVENT FALLS °· Wear shoes with rubber soles that do not have an opening for your toes. °· Keep the inside and outside of your house well lit. °· Use night lights throughout your home. °· Remove clutter from floors. °· Clean up floor spills. °· Remove throw rugs or fasten them to the floor with carpet tape. °· Do not place electrical cords across pathways. °· Put grab bars by your tub, shower, and toilet. Do not use towel bars as grab bars. °· Put handrails on both sides of the stairway. Fix loose handrails. °· Do not climb on stools or stepladders, if possible. °· Do not wax your floors. °· Repair uneven or unsafe sidewalks, walkways, or stairs. °· Keep items you use a lot within reach. °· Be aware of pets. °· Keep emergency numbers next to the telephone. °· Put smoke detectors in your home and near bedrooms. °Ask your doctor what other things you can do to prevent falls. °Document Released: 04/01/2009 Document Revised: 12/05/2011 Document Reviewed: 09/05/2011 °ExitCare® Patient Information ©2015   [employer]. This information is not intended to replace advice given to you by your health care provider. Make sure you discuss any questions you have with your health care provider. ° °

## 2014-11-04 NOTE — ED Notes (Signed)
Pt states pain eased up but returned after moving in radiology. Tammy Triplett notified, see EMAR for med admin

## 2014-11-05 ENCOUNTER — Other Ambulatory Visit (HOSPITAL_COMMUNITY): Payer: Self-pay | Admitting: Nurse Practitioner

## 2014-11-05 DIAGNOSIS — R55 Syncope and collapse: Secondary | ICD-10-CM
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
## 2014-11-12 ENCOUNTER — Ambulatory Visit (HOSPITAL_COMMUNITY): Payer: Medicare Other

## 2014-11-24 ENCOUNTER — Ambulatory Visit (HOSPITAL_COMMUNITY): Payer: Medicare Other
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
## 2014-11-30 ENCOUNTER — Ambulatory Visit (HOSPITAL_COMMUNITY)
Admission: RE | Admit: 2014-11-30 | Discharge: 2014-11-30 | Disposition: A | Discharge status: Home / Self Care | Payer: Medicare Other | Source: Ambulatory Visit | Attending: Nurse Practitioner | Admitting: Nurse Practitioner

## 2014-11-30 ENCOUNTER — Encounter: Payer: Self-pay | Admitting: Cardiovascular Disease

## 2014-11-30 ENCOUNTER — Ambulatory Visit (INDEPENDENT_AMBULATORY_CARE_PROVIDER_SITE_OTHER): Payer: Medicare Other | Admitting: Cardiovascular Disease

## 2014-11-30 VITALS — BP 138/84 | HR 94 | Ht 66.0 in | Wt 218.0 lb

## 2014-11-30 DIAGNOSIS — R55 Syncope and collapse: Secondary | ICD-10-CM

## 2014-11-30 DIAGNOSIS — R002 Palpitations: Secondary | ICD-10-CM | POA: Diagnosis not present

## 2014-11-30 NOTE — Patient Instructions (Signed)
Your physician recommends that you schedule a follow-up appointment in: to be determined after stress echo   Your physician recommends that you continue on your current medications as directed. Please refer to the Current Medication list given to you today.     Your physician has requested that you have a stress echocardiogram. For further information please visit [email or web address]. Please follow instruction sheet as given.      Thank you for choosing [facility] !

## 2014-12-01 ENCOUNTER — Other Ambulatory Visit (HOSPITAL_COMMUNITY): Payer: Self-pay | Admitting: Nurse Practitioner

## 2014-12-01 DIAGNOSIS — R55 Syncope and collapse: Secondary | ICD-10-CM

## 2014-12-02 ENCOUNTER — Ambulatory Visit (HOSPITAL_COMMUNITY): Payer: Medicare Other

## 2014-12-03 ENCOUNTER — Other Ambulatory Visit (HOSPITAL_COMMUNITY): Payer: Self-pay

## 2014-12-07 ENCOUNTER — Inpatient Hospital Stay (HOSPITAL_COMMUNITY): Admission: RE | Admit: 2014-12-07 | Payer: Self-pay | Source: Ambulatory Visit

## 2014-12-07 ENCOUNTER — Ambulatory Visit (HOSPITAL_COMMUNITY)
Admission: RE | Admit: 2014-12-07 | Discharge: 2014-12-07 | Disposition: A | Discharge status: Home / Self Care | Payer: Medicare Other | Source: Ambulatory Visit | Attending: Nurse Practitioner | Admitting: Nurse Practitioner

## 2014-12-07 ENCOUNTER — Ambulatory Visit (HOSPITAL_COMMUNITY)
Admission: RE | Admit: 2014-12-07 | Discharge: 2014-12-07 | Disposition: A | Discharge status: Home / Self Care | Payer: Medicare Other | Source: Ambulatory Visit | Attending: Cardiovascular Disease | Admitting: Cardiovascular Disease

## 2014-12-07 ENCOUNTER — Encounter (HOSPITAL_COMMUNITY): Payer: Self-pay

## 2014-12-07 DIAGNOSIS — R55 Syncope and collapse: Secondary | ICD-10-CM | POA: Insufficient documentation

## 2014-12-07 DIAGNOSIS — R002 Palpitations: Secondary | ICD-10-CM

## 2014-12-07 LAB — ECHOCARDIOGRAM STRESS TEST
CHL CUP RESTING HR STRESS: 85 {beats}/min
CHL RATE OF PERCEIVED EXERTION: 17
CSEPED: 4 min
Estimated workload: 7 METS
Exercise duration (sec): 27 s
MPHR: 163 {beats}/min
Peak HR: 126 {beats}/min
Percent HR: 77 %

## 2014-12-07 LAB — POCT I-STAT CREATININE: Creatinine, Ser: 0.9 mg/dL (ref 0.44–1.00)

## 2014-12-07 MED ORDER — GADOBENATE DIMEGLUMINE 529 MG/ML IV SOLN
20.0000 mL | Freq: Once | INTRAVENOUS | Status: AC | PRN
  Administered 2014-12-07: 20 mL via INTRAVENOUS

## 2014-12-10 ENCOUNTER — Ambulatory Visit (HOSPITAL_COMMUNITY)
Admission: RE | Admit: 2014-12-10 | Discharge: 2014-12-10 | Disposition: A | Discharge status: Home / Self Care | Payer: Medicare Other | Source: Ambulatory Visit | Attending: Nurse Practitioner | Admitting: Nurse Practitioner

## 2014-12-25 ENCOUNTER — Encounter (HOSPITAL_COMMUNITY): Payer: Self-pay | Admitting: Emergency Medicine

## 2014-12-25 ENCOUNTER — Emergency Department (HOSPITAL_COMMUNITY): Payer: Medicare Other

## 2014-12-25 ENCOUNTER — Emergency Department (HOSPITAL_COMMUNITY)
Admission: EM | Admit: 2014-12-25 | Discharge: 2014-12-26 | Disposition: A | Discharge status: Home / Self Care | Payer: Medicare Other | Attending: Emergency Medicine | Admitting: Emergency Medicine

## 2014-12-25 DIAGNOSIS — Z720 Tobacco use: Secondary | ICD-10-CM | POA: Diagnosis not present

## 2014-12-25 DIAGNOSIS — Z8739 Personal history of other diseases of the musculoskeletal system and connective tissue: Secondary | ICD-10-CM | POA: Diagnosis not present

## 2014-12-25 DIAGNOSIS — E780 Pure hypercholesterolemia: Secondary | ICD-10-CM | POA: Diagnosis not present

## 2014-12-25 DIAGNOSIS — F32A Depression, unspecified: Secondary | ICD-10-CM

## 2014-12-25 DIAGNOSIS — E669 Obesity, unspecified: Secondary | ICD-10-CM | POA: Insufficient documentation

## 2014-12-25 DIAGNOSIS — Z791 Long term (current) use of non-steroidal anti-inflammatories (NSAID): Secondary | ICD-10-CM | POA: Diagnosis not present

## 2014-12-25 DIAGNOSIS — F329 Major depressive disorder, single episode, unspecified: Secondary | ICD-10-CM | POA: Diagnosis not present

## 2014-12-25 DIAGNOSIS — Z85038 Personal history of other malignant neoplasm of large intestine: Secondary | ICD-10-CM | POA: Diagnosis not present

## 2014-12-25 DIAGNOSIS — G43909 Migraine, unspecified, not intractable, without status migrainosus: Secondary | ICD-10-CM | POA: Insufficient documentation

## 2014-12-25 DIAGNOSIS — Z046 Encounter for general psychiatric examination, requested by authority: Secondary | ICD-10-CM | POA: Diagnosis present

## 2014-12-25 DIAGNOSIS — Z79899 Other long term (current) drug therapy: Secondary | ICD-10-CM | POA: Insufficient documentation

## 2014-12-25 DIAGNOSIS — F3130 Bipolar disorder, current episode depressed, mild or moderate severity, unspecified: Secondary | ICD-10-CM | POA: Diagnosis present

## 2014-12-25 DIAGNOSIS — Z87828 Personal history of other (healed) physical injury and trauma: Secondary | ICD-10-CM | POA: Diagnosis not present

## 2014-12-25 DIAGNOSIS — I10 Essential (primary) hypertension: Secondary | ICD-10-CM | POA: Diagnosis not present

## 2014-12-25 LAB — CBC WITH DIFFERENTIAL/PLATELET
Basophils Absolute: 0 10*3/uL (ref 0.0–0.1)
Basophils Relative: 0 % (ref 0–1)
EOS ABS: 0.2 10*3/uL (ref 0.0–0.7)
EOS PCT: 3 % (ref 0–5)
HEMATOCRIT: 42.6 % (ref 36.0–46.0)
Hemoglobin: 14.4 g/dL (ref 12.0–15.0)
LYMPHS ABS: 2.1 10*3/uL (ref 0.7–4.0)
LYMPHS PCT: 37 % (ref 12–46)
MCH: 29.8 pg (ref 26.0–34.0)
MCHC: 33.8 g/dL (ref 30.0–36.0)
MCV: 88.2 fL (ref 78.0–100.0)
Monocytes Absolute: 0.3 10*3/uL (ref 0.1–1.0)
Monocytes Relative: 6 % (ref 3–12)
Neutro Abs: 3 10*3/uL (ref 1.7–7.7)
Neutrophils Relative %: 54 % (ref 43–77)
Platelets: 224 10*3/uL (ref 150–400)
RBC: 4.83 MIL/uL (ref 3.87–5.11)
RDW: 14.9 % (ref 11.5–15.5)
WBC: 5.6 10*3/uL (ref 4.0–10.5)

## 2014-12-25 LAB — RAPID URINE DRUG SCREEN, HOSP PERFORMED
AMPHETAMINES: NOT DETECTED
BENZODIAZEPINES: NOT DETECTED
Barbiturates: NOT DETECTED
COCAINE: NOT DETECTED
OPIATES: NOT DETECTED
Tetrahydrocannabinol: NOT DETECTED

## 2014-12-25 LAB — BASIC METABOLIC PANEL
Anion gap: 7 (ref 5–15)
BUN: 12 mg/dL (ref 6–20)
CO2: 28 mmol/L (ref 22–32)
CREATININE: 0.74 mg/dL (ref 0.44–1.00)
Calcium: 9.5 mg/dL (ref 8.9–10.3)
Chloride: 106 mmol/L (ref 101–111)
GFR calc non Af Amer: >60 mL/min (ref >60)
Glucose, Bld: 101 mg/dL — ABNORMAL HIGH (ref 65–99)
Potassium: 3.7 mmol/L (ref 3.5–5.1)
SODIUM: 141 mmol/L (ref 135–145)

## 2014-12-25 LAB — ETHANOL: Alcohol, Ethyl (B): 5 mg/dL — ABNORMAL HIGH (ref <5)

## 2014-12-25 MED ORDER — MELOXICAM 7.5 MG PO TABS: 7.5000 mg | ORAL_TABLET | Freq: Every day | ORAL | Status: DC

## 2014-12-25 MED ORDER — MIRABEGRON ER 25 MG PO TB24
50.0000 mg | ORAL_TABLET | Freq: Every day | ORAL | Status: DC
  Administered 2014-12-26: 50 mg via ORAL
  Filled 2014-12-25 (×6): qty 2

## 2014-12-25 MED ORDER — HYDROCHLOROTHIAZIDE 12.5 MG PO CAPS
12.5000 mg | ORAL_CAPSULE | Freq: Every day | ORAL | Status: DC
  Administered 2014-12-26: 12.5 mg via ORAL
  Filled 2014-12-25 (×6): qty 1

## 2014-12-25 MED ORDER — PRAVASTATIN SODIUM 10 MG PO TABS
10.0000 mg | ORAL_TABLET | Freq: Every day | ORAL | Status: DC
  Filled 2014-12-25 (×5): qty 1

## 2014-12-25 MED ORDER — ALBUTEROL SULFATE HFA 108 (90 BASE) MCG/ACT IN AERS: 1.0000 | INHALATION_SPRAY | Freq: Four times a day (QID) | RESPIRATORY_TRACT | Status: DC | PRN

## 2014-12-25 MED ORDER — METHOCARBAMOL 500 MG PO TABS
750.0000 mg | ORAL_TABLET | Freq: Once | ORAL | Status: AC
  Administered 2014-12-25: 750 mg via ORAL
  Filled 2014-12-25: qty 2

## 2014-12-25 MED ORDER — SERTRALINE HCL 50 MG PO TABS
100.0000 mg | ORAL_TABLET | Freq: Every day | ORAL | Status: DC
  Administered 2014-12-26: 100 mg via ORAL
  Filled 2014-12-25: qty 2

## 2014-12-25 MED ORDER — PANTOPRAZOLE SODIUM 40 MG PO TBEC
40.0000 mg | DELAYED_RELEASE_TABLET | Freq: Every day | ORAL | Status: DC
  Administered 2014-12-26: 40 mg via ORAL
  Filled 2014-12-25: qty 1

## 2014-12-25 MED ORDER — ZOLPIDEM TARTRATE 5 MG PO TABS
10.0000 mg | ORAL_TABLET | ORAL | Status: DC
  Administered 2014-12-25: 10 mg via ORAL
  Filled 2014-12-25: qty 2

## 2014-12-25 MED ORDER — METHOCARBAMOL 500 MG PO TABS
500.0000 mg | ORAL_TABLET | Freq: Three times a day (TID) | ORAL | Status: DC
  Administered 2014-12-26: 500 mg via ORAL
  Filled 2014-12-25: qty 1

## 2014-12-25 MED ORDER — ADULT MULTIVITAMIN W/MINERALS CH
1.0000 | ORAL_TABLET | Freq: Every day | ORAL | Status: DC
  Administered 2014-12-26: 1 via ORAL
  Filled 2014-12-25: qty 1

## 2014-12-25 MED ORDER — AMLODIPINE BESY-BENAZEPRIL HCL 10-20 MG PO CAPS: 1.0000 | ORAL_CAPSULE | Freq: Every day | ORAL | Status: DC

## 2014-12-25 MED ORDER — MELOXICAM 7.5 MG PO TABS
7.5000 mg | ORAL_TABLET | Freq: Every day | ORAL | Status: DC
  Administered 2014-12-26: 7.5 mg via ORAL
  Filled 2014-12-25 (×6): qty 1

## 2014-12-25 NOTE — ED Provider Notes (Addendum)
CSN: [id number]     Arrival date & time 12/25/14  1420 History   First MD Initiated Contact with Patient 12/25/14 [phone]     Chief Complaint  Patient presents with  . V70.1     (Consider location/radiation/quality/duration/timing/severity/associated sxs/prior Treatment) HPI..... Patient reports suicidal ideation and hearing voices telling her to walk in the road and/or take an overdose. She has long-standing mental health problems. She has been crying and having panic attacks. She expresses fatigue.  Past Medical History  Diagnosis Date  . Back pain   . Obesity     History of  . Nicotine addiction   . Psychosis   . Hypertension   . OD (overdose of drug)     hospitalized for 11 days   . Bipolar 1 disorder   . Hypercholesteremia   . Migraine headache   . Lateral epicondylitis  of elbow     right  . Cancer 2014    Colon Cancer   Past Surgical History  Procedure Laterality Date  . Partial hysterectomy    . Breast biopsy  10/18/2011    Procedure: BREAST BIOPSY;  Surgeon: Jamesetta So, MD;  Location: [facility];  Service: General;  Laterality: Left;  . Esophagogastroduodenoscopy N/A 12/26/2012    XBW:IOMBTDH reflux esophagitis. Gastric and duodenal bulbar erosions-status post gastric biopsynegative H.pylori  . Abdominal hysterectomy    . Colonoscopy    . Colonoscopy N/A 05/29/2013    RCB:ULAGTXM mass most likely representing colorectal cancer S/ P biospy.Multiple colonic and rectal polyps removed/treated as described above. Colonic diverticulosis  . Colon resection N/A 06/23/2013    Procedure: HAND ASSISTED LAPAROSCOPIC PARTIAL COLECTOMY;  Surgeon: Jamesetta So, MD;  Location: [facility];  Service: General;  Laterality: N/A;  . Esophagogastroduodenoscopy (egd) with propofol N/A 08/13/2014    RMR: Small benign cystic-appearing lesion in distal esophagus of doubtful clinical significance, otherwise normal esophagus, status post Maloney dilation. Small hiatal hernia, some retained gastric  contents (query delayed gastric emptying.)  . Esophageal dilation N/A 08/13/2014    Procedure: [place];  Surgeon: Daneil Dolin, MD;  Location: [facility];  Service: Endoscopy;  Laterality: N/A;  . Colonoscopy with propofol N/A 08/13/2014    RMR: Status post sigmoid colectomy. Multiple colonic polyps removed as described above. Redundant colon. Pan colonic diverticuloisi  . Polypectomy N/A 08/13/2014    Procedure: POLYPECTOMY;  Surgeon: Daneil Dolin, MD;  Location: [facility];  Service: Endoscopy;  Laterality: N/A;  . Esophageal biopsy N/A 08/13/2014    Procedure: BIOPSY;  Surgeon: Daneil Dolin, MD;  Location: [facility];  Service: Endoscopy;  Laterality: N/A;   Family History  Problem Relation Age of Onset  . Anesthesia problems Neg Hx   . Hypotension Neg Hx   . Malignant hyperthermia Neg Hx   . Pseudochol deficiency Neg Hx   . Colon cancer Father     diagnosed in his 80s   History  Substance Use Topics  . Smoking status: Current Every Day Smoker -- 0.50 packs/day for 38 years    Types: Cigarettes    Last Attempt to Quit: 06/19/2014  . Smokeless tobacco: Never Used     Comment: vapor only  . Alcohol Use: No   OB History    Gravida Para Term Preterm AB TAB SAB Ectopic Multiple Living   4 3 3  1 1          Review of Systems  All other systems reviewed and are negative.  Allergies  Review of patient's allergies indicates no known allergies.  Home Medications   Prior to Admission medications   Medication Sig Start Date End Date Taking? Authorizing Provider  amLODipine-benazepril (LOTREL) 10-20 MG per capsule Take 1 capsule by mouth daily.  12/04/14  Yes Historical Provider, MD  clonazePAM (KLONOPIN) 1 MG tablet Take 1 mg by mouth 2 (two) times daily. 12/18/14  Yes Historical Provider, MD  fluticasone (FLONASE) 50 MCG/ACT nasal spray Place 1-2 sprays into both nostrils daily. 12/16/14  Yes Historical Provider, MD  hydrochlorothiazide (MICROZIDE) 12.5 MG  capsule Take 12.5 mg by mouth daily.   Yes Historical Provider, MD  lovastatin (MEVACOR) 20 MG tablet Take 20 mg by mouth at bedtime.   Yes Historical Provider, MD  meloxicam (MOBIC) 7.5 MG tablet Take 7.5 mg by mouth daily.   Yes Historical Provider, MD  methocarbamol (ROBAXIN) 500 MG tablet Take 1 tablet (500 mg total) by mouth 3 (three) times daily. 11/04/14  Yes Tammy Triplett, PA-C  mirabegron ER (MYRBETRIQ) 50 MG TB24 tablet Take 50 mg by mouth daily.   Yes Historical Provider, MD  Multiple Vitamin (MULTIVITAMIN WITH MINERALS) TABS tablet Take 1 tablet by mouth daily.   Yes Historical Provider, MD  omeprazole (PRILOSEC) 20 MG capsule TAKE ONE CAPSULE BY MOUTH 30 MINUTES PRIOR TO FIRST MEAL OF THE DAY. 09/23/14  Yes Mahala Menghini, PA-C  PROAIR HFA 108 (90 BASE) MCG/ACT inhaler Inhale 1-2 puffs into the lungs every 6 (six) hours as needed. 12/04/14  Yes Historical Provider, MD  sertraline (ZOLOFT) 100 MG tablet Take 100 mg by mouth daily. 12/18/14  Yes Historical Provider, MD  zolpidem (AMBIEN) 10 MG tablet Take 10 mg by mouth every other day.   Yes Historical Provider, MD  HYDROcodone-acetaminophen (NORCO/VICODIN) 5-325 MG per tablet Take one tab po q 4-6 hrs prn pain Patient not taking: Reported on 12/25/2014 11/04/14   Tammy Triplett, PA-C   BP 148/113 mmHg  Pulse 103  Temp(Src) 98.5 F (36.9 C) (Oral)  Resp 18  Ht 5\' 6"  (1.676 m)  Wt 214 lb (97.07 kg)  BMI 34.56 kg/m2  SpO2 100% Physical Exam  Constitutional: She is oriented to person, place, and time. She appears well-developed and well-nourished.  HENT:  Head: Normocephalic and atraumatic.  Eyes: Conjunctivae and EOM are normal. Pupils are equal, round, and reactive to light.  Neck: Normal range of motion. Neck supple.  Cardiovascular: Normal rate and regular rhythm.   Pulmonary/Chest: Effort normal and breath sounds normal.  Abdominal: Soft. Bowel sounds are normal.  Musculoskeletal: Normal range of motion.  Neurological: She is  alert and oriented to person, place, and time.  Skin: Skin is warm and dry.  Psychiatric:  Flat affect, depressed  Nursing note and vitals reviewed.   ED Course  Procedures (including critical care time) Labs Review Labs Reviewed  BASIC METABOLIC PANEL - Abnormal; Notable for the following:    Glucose, Bld 101 (*)    All other components within normal limits  ETHANOL - Abnormal; Notable for the following:    Alcohol, Ethyl (B) 5 (*)    All other components within normal limits  CBC WITH DIFFERENTIAL/PLATELET  URINE RAPID DRUG SCREEN, HOSP PERFORMED    Imaging Review No results found.   EKG Interpretation None      MDM   Final diagnoses:  Depression    Patient is depressed and suicidal. Will consult behavioral health. Commitment papers signed.  1900:   Patient refused chest x-ray and  EKG.  Nat Christen, MD 12/25/14 [place], MD 12/25/14 [phone]

## 2014-12-25 NOTE — ED Notes (Signed)
AC aware of sitter. Dee sitting with patient at this time.

## 2014-12-25 NOTE — BH Assessment (Addendum)
Tele Assessment Note   Ashley Patrick is an 59 y.o. female who came to [facility] c/o increasing depression and hearing voices telling her to "end it all'. Pt says that she has recently learned that her brother and nephew have HIV and she recently lost a good friend who dies a few months ago. "I feel like I am in a box and everyone's problems are on top of me and my problems are on top of me, and I can't get out".  Pt says she has thought about walking into traffic and overdosing, and she has attempted suicide in the past 2 x. She has support from her daughter and her son, and her daughter comes about 3x/week to help her with some ADLs when her arthritis gets bad.  During interview, pt was casually dressed, made fair eye contact, and had somewhat restless movement. She had normal thought content, denies SA, HI and history of violence. There was no evidence she was responding to internal stimuli. She was calm and cooperative with tearful affect and depressed mood.  Pt states that she has had one IP admission before to [facility], and has been going to [place] [facility] for [facility] services for several years. She states that she lives alone, and that she is going around her house checking every room and closet and can't stop herself, and does not know why.   Glenda Chroman, NP, recommends IP treatment. TTs will seek placement.  Axis I: Major Depression, Recurrent severe and Psychotic Disorder NOS Axis II: Deferred Axis III:  Past Medical History  Diagnosis Date  . Back pain   . Obesity     History of  . Nicotine addiction   . Psychosis   . Hypertension   . OD (overdose of drug)     hospitalized for 11 days   . Bipolar 1 disorder   . Hypercholesteremia   . Migraine headache   . Lateral epicondylitis  of elbow     right  . Cancer 2014    Colon Cancer   Axis IV: other psychosocial or environmental problems Axis V: 41-50 serious symptoms  Past Medical History:  Past Medical History  Diagnosis Date  . Back  pain   . Obesity     History of  . Nicotine addiction   . Psychosis   . Hypertension   . OD (overdose of drug)     hospitalized for 11 days   . Bipolar 1 disorder   . Hypercholesteremia   . Migraine headache   . Lateral epicondylitis  of elbow     right  . Cancer 2014    Colon Cancer    Past Surgical History  Procedure Laterality Date  . Partial hysterectomy    . Breast biopsy  10/18/2011    Procedure: BREAST BIOPSY;  Surgeon: Jamesetta So, MD;  Location: [facility];  Service: General;  Laterality: Left;  . Esophagogastroduodenoscopy N/A 12/26/2012    CBJ:SEGBTDV reflux esophagitis. Gastric and duodenal bulbar erosions-status post gastric biopsynegative H.pylori  . Abdominal hysterectomy    . Colonoscopy    . Colonoscopy N/A 05/29/2013    VOH:YWVPXTG mass most likely representing colorectal cancer S/ P biospy.Multiple colonic and rectal polyps removed/treated as described above. Colonic diverticulosis  . Colon resection N/A 06/23/2013    Procedure: HAND ASSISTED LAPAROSCOPIC PARTIAL COLECTOMY;  Surgeon: Jamesetta So, MD;  Location: [facility];  Service: General;  Laterality: N/A;  . Esophagogastroduodenoscopy (egd) with propofol N/A 08/13/2014    RMR:  Small benign cystic-appearing lesion in distal esophagus of doubtful clinical significance, otherwise normal esophagus, status post Maloney dilation. Small hiatal hernia, some retained gastric contents (query delayed gastric emptying.)  . Esophageal dilation N/A 08/13/2014    Procedure: [place];  Surgeon: Daneil Dolin, MD;  Location: [facility];  Service: Endoscopy;  Laterality: N/A;  . Colonoscopy with propofol N/A 08/13/2014    RMR: Status post sigmoid colectomy. Multiple colonic polyps removed as described above. Redundant colon. Pan colonic diverticuloisi  . Polypectomy N/A 08/13/2014    Procedure: POLYPECTOMY;  Surgeon: Daneil Dolin, MD;  Location: [facility];  Service: Endoscopy;  Laterality: N/A;  . Esophageal  biopsy N/A 08/13/2014    Procedure: BIOPSY;  Surgeon: Daneil Dolin, MD;  Location: [facility];  Service: Endoscopy;  Laterality: N/A;    Family History:  Family History  Problem Relation Age of Onset  . Anesthesia problems Neg Hx   . Hypotension Neg Hx   . Malignant hyperthermia Neg Hx   . Pseudochol deficiency Neg Hx   . Colon cancer Father     diagnosed in his 46s    Social History:  reports that she has been smoking Cigarettes.  She has a 19 pack-year smoking history. She has never used smokeless tobacco. She reports that she does not drink alcohol or use illicit drugs.  Additional Social History:  Alcohol / Drug Use Pain Medications: denies Prescriptions: denies Over the Counter: denies History of alcohol / drug use?: No history of alcohol / drug abuse Longest period of sobriety (when/how long): denies Negative Consequences of Use:  (denies) Withdrawal Symptoms:  (denies)  CIWA: CIWA-Ar BP: (!) 148/113 mmHg Pulse Rate: 103 COWS:    PATIENT STRENGTHS: (choose at least two) Ability for insight Capable of independent living Communication skills Supportive family/friends  Allergies: No Known Allergies  Home Medications:  (Not in a hospital admission)  OB/GYN Status:  No LMP recorded. Patient has had a hysterectomy.  General Assessment Data [facility] of Assessment: [facility] Assessment: In system Is this a Tele or Face-to-Face Assessment?: Tele Assessment Is this an Initial Assessment or a Re-assessment for this encounter?: Initial Assessment Marital status: Single Is patient pregnant?: No Pregnancy Status: No Living Arrangements: Alone Can pt return to current living arrangement?: Yes Admission Status: Voluntary Is patient capable of signing voluntary admission?: Yes Referral Source: Self/Family/Friend Insurance type: [facility]     Crisis Care Plan Living Arrangements: Alone Name of Psychiatrist: [facility] Name of Therapist: [facility]  Education  Status Is patient currently in school?: No Highest grade of school patient has completed: 9th  Risk to self with the past 6 months Suicidal Ideation: Yes-Currently Present Has patient been a risk to self within the past 6 months prior to admission? : No Suicidal Intent: No Has patient had any suicidal intent within the past 6 months prior to admission? : No Is patient at risk for suicide?: Yes Suicidal Plan?: Yes-Currently Present Has patient had any suicidal plan within the past 6 months prior to admission? :  (wlaking in front of traffic, OD) Specify Current Suicidal Plan:  (traffic, OD) Access to Means: Yes Specify Access to Suicidal Means: environment What has been your use of drugs/alcohol within the last 12 months?: deneis Previous Attempts/Gestures: Yes How many times?: 2 Other Self Harm Risks: none known Triggers for Past Attempts: Unpredictable Intentional Self Injurious Behavior: Damaging Comment - Self Injurious Behavior:  (none) Family Suicide History: No Recent stressful  life event(s): Loss (Comment) (freind dies, briother, nephew dx with HIV) Persecutory voices/beliefs?: No Depression: Yes Depression Symptoms: Tearfulness, Feeling worthless/self pity, Insomnia, Fatigue, Isolating, Guilt, Loss of interest in usual pleasures Substance abuse history and/or treatment for substance abuse?: No Suicide prevention information given to non-admitted patients: Not applicable  Risk to Others within the past 6 months Homicidal Ideation: No Does patient have any lifetime risk of violence toward others beyond the six months prior to admission? : No Thoughts of Harm to Others: No Current Homicidal Intent: No Current Homicidal Plan: No Access to Homicidal Means: No History of harm to others?: No Assessment of Violence: None Noted Does patient have access to weapons?: Yes (Comment) (knives) Criminal Charges Pending?: No Does patient have a court date: No Is patient on  probation?: No  Psychosis Hallucinations: Auditory Delusions: Persecutory  Mental Status Report Appearance/Hygiene: Unremarkable, In scrubs Eye Contact: Good Motor Activity: Unremarkable Speech: Logical/coherent Level of Consciousness: Alert Mood: Depressed, Sad Affect: Depressed, Sad Anxiety Level: Panic Attacks Most recent panic attack: 4 per week Thought Processes: Coherent, Relevant Judgement: Unimpaired Orientation: Person, Place, Time, Situation, Appropriate for developmental age Obsessive Compulsive Thoughts/Behaviors: None  Cognitive Functioning Concentration: Decreased Memory: Recent Intact, Remote Intact IQ: Average Insight: Fair Impulse Control: Fair Appetite: Poor Weight Loss: 3 Weight Gain: 0 Sleep: Decreased Vegetative Symptoms: Decreased grooming  ADLScreening [facility] Assessment Services) Patient's cognitive ability adequate to safely complete daily activities?: Yes Patient able to express need for assistance with ADLs?: Yes Independently performs ADLs?:  (sometimes dgtr helps her when arthritis is acting up)  Prior Inpatient Therapy Prior Inpatient Therapy: Yes Prior Therapy Dates:  (years ago) Prior Therapy Facilty/Provider(s):  ([facility]) Reason for Treatment: depressionh  Prior Outpatient Therapy Prior Outpatient Therapy: Yes Prior Therapy Dates: 2004 to present Prior Therapy Facilty/Provider(s): [facility] Reason for Treatment: depresion Does patient have an ACCT team?: No Does patient have Intensive In-House Services?  : No Does patient have [facility] services? : No Does patient have P4CC services?: No  ADL Screening (condition at time of admission) Patient's cognitive ability adequate to safely complete daily activities?: Yes Is the patient deaf or have difficulty hearing?: No Does the patient have difficulty seeing, even when wearing glasses/contacts?: No Does the patient have difficulty concentrating, remembering, or making decisions?:  No Patient able to express need for assistance with ADLs?: Yes Does the patient have difficulty dressing or bathing?: No (sometimes dgtr helps her when arthritis is acting up) Independently performs ADLs?:  (sometimes dgtr helps her when arthritis is acting up) Does the patient have difficulty walking or climbing stairs?: Yes Weakness of Legs: Right Weakness of Arms/Hands: None  Home Assistive Devices/Equipment Home Assistive Devices/Equipment: Cane (specify quad or straight), Shower chair without back    Abuse/Neglect Assessment (Assessment to be complete while patient is alone) Physical Abuse:  (pt refused) Verbal Abuse:  (pt refused) Sexual Abuse:  (pt refused) Exploitation of patient/patient's resources: Denies Self-Neglect: Denies Values / Beliefs Cultural Requests During Hospitalization: None Spiritual Requests During Hospitalization: None   Advance Directives (For Healthcare) Does patient have an advance directive?: No, Yes Would patient like information on creating an advanced directive?: No - patient declined information Copy of advanced directive(s) in chart?: No - copy requested    Additional Information 1:1 In Past 12 Months?: No CIRT Risk: No Elopement Risk: No Does patient have medical clearance?: Yes     Disposition:  Disposition Initial Assessment Completed for this Encounter: Yes Disposition of Patient: Inpatient treatment program  Brihana Quickel  [facility] 12/25/2014 4:19 PM

## 2014-12-25 NOTE — ED Notes (Signed)
Pt reports suicidal ideation that started with hearing voices telling her to walk out in the road or take an overdose. Pt states she constantly has paranoia and panic attacks.

## 2014-12-25 NOTE — ED Notes (Signed)
Patient requesting meds for pain and athritis. EDP notified. Patient also asking can she change her mind about being here. Patient told that since she is SI, she cant leave on her own and we would have to make her IVC.

## 2014-12-25 NOTE — Progress Notes (Signed)
Patient was referred to the following facilities for IP psych treatment: [facility] - per intake, adult female and geri beds open, fax it. [facility] - per intake, fax referral for review. [facility] - per intake, fax it for waitlist. [facility] - per Caryl Pina, fax referral, open geri adult and child/adolescent [facility] - left voicemail [facility] - per Gerald Stabs, fax it. [facility] - per intake, fax referral. [facility] - per intake, fax it with EKG and Chest xray when available.  At capacity: [facility]  [facility] will continue to seek placement.  Verlon Setting, [place] Disposition staff 12/25/2014 5:14 PM

## 2014-12-26 DIAGNOSIS — F329 Major depressive disorder, single episode, unspecified: Secondary | ICD-10-CM | POA: Diagnosis not present

## 2014-12-26 MED ORDER — HYDROCORTISONE 1 % EX CREA
TOPICAL_CREAM | Freq: Once | CUTANEOUS | Status: AC
  Administered 2014-12-26: 1 via TOPICAL

## 2014-12-26 MED ORDER — BENAZEPRIL HCL 10 MG PO TABS
20.0000 mg | ORAL_TABLET | Freq: Every day | ORAL | Status: DC
  Administered 2014-12-26: 20 mg via ORAL
  Filled 2014-12-26 (×5): qty 2

## 2014-12-26 MED ORDER — AMLODIPINE BESYLATE 5 MG PO TABS
10.0000 mg | ORAL_TABLET | Freq: Every day | ORAL | Status: DC
  Administered 2014-12-26: 10 mg via ORAL
  Filled 2014-12-26: qty 2

## 2014-12-26 MED ORDER — HYDROCORTISONE 1 % EX CREA
TOPICAL_CREAM | CUTANEOUS | Status: AC
  Administered 2014-12-26: 1 via TOPICAL
  Filled 2014-12-26: qty 1.5

## 2014-12-26 NOTE — BH Assessment (Signed)
[place] Assessment Progress Note  Spoke with pt's dgtr Loetta Rough, who said that she just thinks that her mom had a bad day yesterday due to an argument with her niece that occurred yesterday. She denies that her mother is a danger to herself, or that she has been talking about hurting herself or responding to internal stimuli or hearing voices. She states that she lives 3 minutes away from pt and that her mother calls her whenever she needs anything, and she helps her "all the time". She also states that her mother takes her medication as directed and goes to [place] [facility] about 3x/month for Op treatment.

## 2014-12-26 NOTE — ED Notes (Signed)
Patient resting in bed, lights off in room. No acute distress noted.

## 2014-12-26 NOTE — Progress Notes (Signed)
Disposition CSW completed referrals to the following inpatient Geri-Psych facilities:  [facility] will continue to follow patient for placement needs.  [facility] Disposition CSW [phone]

## 2014-12-26 NOTE — Discharge Instructions (Signed)
Follow up with out pt tx

## 2014-12-26 NOTE — ED Notes (Signed)
Pt taken to shower with female sitter and security x 2

## 2014-12-26 NOTE — ED Notes (Signed)
Pt reporting that she is feeling much better today. Pt states she does not feel as depressed at this time and is no longer hearing voices. Pt denies any thoughts of SI, pt states she never wanted to harm herself she just heard voices telling her to do so. Pt voices desire to be d/c and follow-up with her regular counselor. Spoke with [facility] about conversation with pt. Per [facility] they are still seeking placement, however, will they will speak with the NP when they arrive about re-evaluating care plan.

## 2014-12-26 NOTE — ED Notes (Signed)
[place],NP re-evaluating via tele-psych

## 2014-12-26 NOTE — Consult Note (Signed)
Telepsych Consultation   Reason for Consult:  Suicidal ideation with command hallucinations, improving Referring Physician:  EDP Patient Identification: Ashley Patrick MRN:  [id number] Principal Diagnosis: Bipolar affective disorder, current episode depressed Diagnosis:   Patient Active Problem List   Diagnosis Date Noted  . Bipolar affective disorder, current episode depressed [F31.30] 08/08/2013    Priority: High  . Hiatal hernia [K44.9]   . History of colonic polyps [Z86.010]   . Diverticulosis of colon without hemorrhage [K57.30]   . Constipation [K59.00] 07/22/2014  . History of colon cancer [Z85.038] 07/20/2014  . Dysphagia [R13.10] 07/20/2014  . Lower abdominal pain [R10.30] 09/30/2013  . Anxiety, generalized [F41.1] 08/08/2013  . 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 Noncompliance with medications [Z91.14] 08/07/2013  . Colon cancer [C18.9] 06/23/2013  . Rectal bleeding [K62.5] 05/09/2013  . Gastritis [K29.70] 05/09/2013  . LLQ pain [R10.32] 11/28/2012  . Hemorrhagic cyst of ovary [N83.20] 11/28/2012  . SHOULDER PAIN, LEFT [M25.519] 05/18/2010  . ACUTE BRONCHITIS [J20.9] 09/03/2008  . UNSPECIFIED URINARY INCONTINENCE [R32] 09/03/2008  . ACUTE CYSTITIS [N30.00] 07/19/2008  . Abdominal pain, generalized [R10.84] 07/09/2008  . OBESITY [E66.9] 11/22/2007  . UNSPECIFIED PSYCHOSIS [F29] 11/22/2007  . MIGRAINE HEADACHE [G43.909] 11/22/2007  . HYPERTENSION [I10] 11/22/2007  . BACK PAIN [M54.9] 11/22/2007  . LATERAL EPICONDYLITIS OF ELBOW [M77.10] 11/22/2007    Total Time spent with patient: 25 minutes  Subjective:   Ashley Patrick is a 59 y.o. female patient admitted with reports of command hallucinations to kill self and subsequent IVC. Pt seen and chart reviewed. Pt confirms information below yet reports improvement overnight. Pt minimizes risk to self and reports that she "would never actually hurt myself or follow through with  any of it." Pt denies suicidal/homicidal ideation and psychosis and does not appear to be responding to internal stimuli.   Attempted to obtain collateral from family with 4 numbers and no result. [facility] obtained a new phone number and is calling family now.   Family reached and pt's daughter reports that pt was having a very unusual/bad day yesterday and that she was overwhelmed; that this is not normal for her and that she appears to be back at baseline now.Pt also able to contract for safety at this time. Pt and daughter in agreement to keep all followup appointments and call [facility] (her provider) to followup ASAP.   HPI:   Ashley Patrick is an 59 y.o. female who came to [place] c/o increasing depression and hearing voices telling her to "end it all'. Pt says that she has recently learned that her brother and nephew have HIV and she recently lost a good friend who dies a few months ago. "I feel like I am in a box and everyone's problems are on top of me and my problems are on top of me, and I can't get out". Pt says she has thought about walking into traffic and overdosing, and she has attempted suicide in the past 2 x. She has support from her daughter and her son, and her daughter comes about 3x/week to help her with some ADLs when her arthritis gets bad.  During interview, pt was casually dressed, made fair eye contact, and had somewhat restless movement. She had normal thought content, denies SA, HI and history of violence. There was no evidence she was responding to internal stimuli. She was calm and cooperative with tearful affect and depressed mood. Pt states that she has  had one IP admission before to [facility], and has been going to [facility] services for several years. She states that she lives alone, and that she is going around her house checking every room and closet and can't stop herself, and does not know why.   Past Medical History:  Past Medical History   Diagnosis Date  . Back pain   . Obesity     History of  . Nicotine addiction   . Psychosis   . Hypertension   . OD (overdose of drug)     hospitalized for 11 days   . Bipolar 1 disorder   . Hypercholesteremia   . Migraine headache   . Lateral epicondylitis  of elbow     right  . Cancer 2014    Colon Cancer    Past Surgical History  Procedure Laterality Date  . Partial hysterectomy    . Breast biopsy  10/18/2011    Procedure: BREAST BIOPSY;  Surgeon: Jamesetta So, MD;  Location: [facility];  Service: General;  Laterality: Left;  . Esophagogastroduodenoscopy N/A 12/26/2012    DVV:OHYWVPX reflux esophagitis. Gastric and duodenal bulbar erosions-status post gastric biopsynegative H.pylori  . Abdominal hysterectomy    . Colonoscopy    . Colonoscopy N/A 05/29/2013    TGG:YIRSWNI mass most likely representing colorectal cancer S/ P biospy.Multiple colonic and rectal polyps removed/treated as described above. Colonic diverticulosis  . Colon resection N/A 06/23/2013    Procedure: HAND ASSISTED LAPAROSCOPIC PARTIAL COLECTOMY;  Surgeon: Jamesetta So, MD;  Location: [facility];  Service: General;  Laterality: N/A;  . Esophagogastroduodenoscopy (egd) with propofol N/A 08/13/2014    RMR: Small benign cystic-appearing lesion in distal esophagus of doubtful clinical significance, otherwise normal esophagus, status post Maloney dilation. Small hiatal hernia, some retained gastric contents (query delayed gastric emptying.)  . Esophageal dilation N/A 08/13/2014    Procedure: Start;  Surgeon: Daneil Dolin, MD;  Location: [facility];  Service: Endoscopy;  Laterality: N/A;  . Colonoscopy with propofol N/A 08/13/2014    RMR: Status post sigmoid colectomy. Multiple colonic polyps removed as described above. Redundant colon. Pan colonic diverticuloisi  . Polypectomy N/A 08/13/2014    Procedure: POLYPECTOMY;  Surgeon: Daneil Dolin, MD;  Location: [facility];  Service: Endoscopy;   Laterality: N/A;  . Esophageal biopsy N/A 08/13/2014    Procedure: BIOPSY;  Surgeon: Daneil Dolin, MD;  Location: [facility];  Service: Endoscopy;  Laterality: N/A;   Family History:  Family History  Problem Relation Age of Onset  . Anesthesia problems Neg Hx   . Hypotension Neg Hx   . Malignant hyperthermia Neg Hx   . Pseudochol deficiency Neg Hx   . Colon cancer Father     diagnosed in his 74s   Social History:  History  Alcohol Use No     History  Drug Use No    History   Social History  . Marital Status: Legally Separated    Spouse Name: N/A  . Number of Children: 3  . Years of Education: N/A   Social History Main Topics  . Smoking status: Current Every Day Smoker -- 0.50 packs/day for 38 years    Types: Cigarettes    Last Attempt to Quit: 06/19/2014  . Smokeless tobacco: Never Used     Comment: vapor only  . Alcohol Use: No  . Drug Use: No  . Sexual Activity: Yes    Birth Control/ Protection: Surgical  Other Topics Concern  . None   Social History Narrative   Additional Social History:    Pain Medications: denies Prescriptions: denies Over the Counter: denies History of alcohol / drug use?: No history of alcohol / drug abuse Longest period of sobriety (when/how long): denies Negative Consequences of Use:  (denies) Withdrawal Symptoms:  (denies)                     Allergies:  No Known Allergies  Labs:  Results for orders placed or performed during the hospital encounter of 12/25/14 (from the past 48 hour(s))  Basic metabolic panel     Status: Abnormal   Collection Time: 12/25/14  3:22 PM  Result Value Ref Range   Sodium 141 135 - 145 mmol/L   Potassium 3.7 3.5 - 5.1 mmol/L   Chloride 106 101 - 111 mmol/L   CO2 28 22 - 32 mmol/L   Glucose, Bld 101 (H) 65 - 99 mg/dL   BUN 12 6 - 20 mg/dL   Creatinine, Ser 0.74 0.44 - 1.00 mg/dL   Calcium 9.5 8.9 - 10.3 mg/dL   GFR calc non Af Amer >60 >60 mL/min   GFR calc Af Amer >60 >60 mL/min     Comment: (NOTE) The eGFR has been calculated using the CKD EPI equation. This calculation has not been validated in all clinical situations. eGFR's persistently <60 mL/min signify possible Chronic Kidney Disease.    Anion gap 7 5 - 15  CBC with Differential     Status: None   Collection Time: 12/25/14  3:22 PM  Result Value Ref Range   WBC 5.6 4.0 - 10.5 K/uL   RBC 4.83 3.87 - 5.11 MIL/uL   Hemoglobin 14.4 12.0 - 15.0 g/dL   HCT 42.6 36.0 - 46.0 %   MCV 88.2 78.0 - 100.0 fL   MCH 29.8 26.0 - 34.0 pg   MCHC 33.8 30.0 - 36.0 g/dL   RDW 14.9 11.5 - 15.5 %   Platelets 224 150 - 400 K/uL   Neutrophils Relative % 54 43 - 77 %   Neutro Abs 3.0 1.7 - 7.7 K/uL   Lymphocytes Relative 37 12 - 46 %   Lymphs Abs 2.1 0.7 - 4.0 K/uL   Monocytes Relative 6 3 - 12 %   Monocytes Absolute 0.3 0.1 - 1.0 K/uL   Eosinophils Relative 3 0 - 5 %   Eosinophils Absolute 0.2 0.0 - 0.7 K/uL   Basophils Relative 0 0 - 1 %   Basophils Absolute 0.0 0.0 - 0.1 K/uL  Ethanol     Status: Abnormal   Collection Time: 12/25/14  3:22 PM  Result Value Ref Range   Alcohol, Ethyl (B) 5 (H) <5 mg/dL    Comment:        LOWEST DETECTABLE LIMIT FOR SERUM ALCOHOL IS 5 mg/dL FOR MEDICAL PURPOSES ONLY   Urine rapid drug screen (hosp performed)     Status: None   Collection Time: 12/25/14  3:46 PM  Result Value Ref Range   Opiates NONE DETECTED NONE DETECTED   Cocaine NONE DETECTED NONE DETECTED   Benzodiazepines NONE DETECTED NONE DETECTED   Amphetamines NONE DETECTED NONE DETECTED   Tetrahydrocannabinol NONE DETECTED NONE DETECTED   Barbiturates NONE DETECTED NONE DETECTED    Comment:        DRUG SCREEN FOR MEDICAL PURPOSES ONLY.  IF CONFIRMATION IS NEEDED FOR ANY PURPOSE, NOTIFY LAB WITHIN 5 DAYS.  LOWEST DETECTABLE LIMITS FOR URINE DRUG SCREEN Drug Class       Cutoff (ng/mL) Amphetamine      1000 Barbiturate      200 Benzodiazepine   409 Tricyclics       811 Opiates          300 Cocaine           300 THC              50     Vitals: Blood pressure 143/84, pulse 88, temperature 98.5 F (36.9 C), temperature source Oral, resp. rate 18, height _0  (1.676 m), weight 97.07 kg (214 lb), SpO2 100 %.  Risk to Self: Suicidal Ideation: Yes-Currently Present Suicidal Intent: No Is patient at risk for suicide?: Yes Suicidal Plan?: Yes-Currently Present Specify Current Suicidal Plan:  (traffic, OD) Access to Means: Yes Specify Access to Suicidal Means: environment What has been your use of drugs/alcohol within the last 12 months?: deneis How many times?: 2 Other Self Harm Risks: none known Triggers for Past Attempts: Unpredictable Intentional Self Injurious Behavior: Damaging Comment - Self Injurious Behavior:  (none) Risk to Others: Homicidal Ideation: No Thoughts of Harm to Others: No Current Homicidal Intent: No Current Homicidal Plan: No Access to Homicidal Means: No History of harm to others?: No Assessment of Violence: None Noted Does patient have access to weapons?: Yes (Comment) (knives) Criminal Charges Pending?: No Does patient have a court date: No Prior Inpatient Therapy: Prior Inpatient Therapy: Yes Prior Therapy Dates:  (years ago) Prior Therapy Facilty/Provider(s):  ([facility]) Reason for Treatment: depressionh Prior Outpatient Therapy: Prior Outpatient Therapy: Yes Prior Therapy Dates: 2004 to present Prior Therapy Facilty/Provider(s): [facility] Reason for Treatment: depresion Does patient have an ACCT team?: No Does patient have Intensive In-House Services?  : No Does patient have [facility] services? : No Does patient have P4CC services?: No  Current Facility-Administered Medications  Medication Dose Route Frequency Provider Last Rate Last Dose  . albuterol (PROVENTIL HFA;VENTOLIN HFA) 108 (90 BASE) MCG/ACT inhaler 1-2 puff  1-2 puff Inhalation Q6H PRN Nat Christen, MD      . amLODipine (NORVASC) tablet 10 mg  10 mg Oral Daily Nat Christen, MD   10 mg at  12/26/14 9147   And  . benazepril (LOTENSIN) tablet 20 mg  20 mg Oral Daily Nat Christen, MD   20 mg at 12/26/14 0904  . hydrochlorothiazide (MICROZIDE) capsule 12.5 mg  12.5 mg Oral Daily Nat Christen, MD   12.5 mg at 12/26/14 0903  . meloxicam (MOBIC) tablet 7.5 mg  7.5 mg Oral Daily Nat Christen, MD   7.5 mg at 12/26/14 0903  . methocarbamol (ROBAXIN) tablet 500 mg  500 mg Oral TID Nat Christen, MD   500 mg at 12/26/14 0901  . mirabegron ER (MYRBETRIQ) tablet 50 mg  50 mg Oral Daily Nat Christen, MD   50 mg at 12/26/14 0900  . multivitamin with minerals tablet 1 tablet  1 tablet Oral Daily Nat Christen, MD   1 tablet at 12/26/14 0901  . pantoprazole (PROTONIX) EC tablet 40 mg  40 mg Oral Daily Nat Christen, MD   40 mg at 12/26/14 0902  . pravastatin (PRAVACHOL) tablet 10 mg  10 mg Oral q1800 Nat Christen, MD   10 mg at 12/26/14 0915  . sertraline (ZOLOFT) tablet 100 mg  100 mg Oral Daily Nat Christen, MD   100 mg at 12/26/14 0901  . zolpidem (AMBIEN) tablet 10 mg  10  mg Oral Leda Quail, MD   10 mg at 12/25/14 2121   Current Outpatient Prescriptions  Medication Sig Dispense Refill  . amLODipine-benazepril (LOTREL) 10-20 MG per capsule Take 1 capsule by mouth daily.     . clonazePAM (KLONOPIN) 1 MG tablet Take 1 mg by mouth 2 (two) times daily.    . fluticasone (FLONASE) 50 MCG/ACT nasal spray Place 1-2 sprays into both nostrils daily.    . hydrochlorothiazide (MICROZIDE) 12.5 MG capsule Take 12.5 mg by mouth daily.    Marland Kitchen lovastatin (MEVACOR) 20 MG tablet Take 20 mg by mouth at bedtime.    . meloxicam (MOBIC) 7.5 MG tablet Take 7.5 mg by mouth daily.    . methocarbamol (ROBAXIN) 500 MG tablet Take 1 tablet (500 mg total) by mouth 3 (three) times daily. 21 tablet 0  . mirabegron ER (MYRBETRIQ) 50 MG TB24 tablet Take 50 mg by mouth daily.    . Multiple Vitamin (MULTIVITAMIN WITH MINERALS) TABS tablet Take 1 tablet by mouth daily.    Marland Kitchen omeprazole (PRILOSEC) 20 MG capsule TAKE ONE CAPSULE BY MOUTH 30 MINUTES  PRIOR TO FIRST MEAL OF THE DAY. 30 capsule 11  . PROAIR HFA 108 (90 BASE) MCG/ACT inhaler Inhale 1-2 puffs into the lungs every 6 (six) hours as needed.    . sertraline (ZOLOFT) 100 MG tablet Take 100 mg by mouth daily.    Marland Kitchen zolpidem (AMBIEN) 10 MG tablet Take 10 mg by mouth every other day.    Marland Kitchen HYDROcodone-acetaminophen (NORCO/VICODIN) 5-325 MG per tablet Take one tab po q 4-6 hrs prn pain (Patient not taking: Reported on 12/25/2014) 15 tablet 0    Musculoskeletal: UTO, camera  Psychiatric Specialty Exam: Physical Exam  Review of Systems  Psychiatric/Behavioral: Positive for depression. Negative for suicidal ideas and hallucinations. The patient is nervous/anxious.   All other systems reviewed and are negative.   Blood pressure 143/84, pulse 88, temperature 98.5 F (36.9 C), temperature source Oral, resp. rate 18, height _0  (1.676 m), weight 97.07 kg (214 lb), SpO2 100 %.Body mass index is 34.56 kg/(m^2).  General Appearance: Casual and Fairly Groomed  Engineer, water::  Good  Speech:  Clear and Coherent and Normal Rate  Volume:  Normal  Mood:  Anxious and Depressed  Affect:  Appropriate, Congruent and Depressed  Thought Process:  Coherent and Goal Directed  Orientation:  Full (Time, Place, and Person)  Thought Content:  WDL  Suicidal Thoughts:  Yes.  without intent/plan  Homicidal Thoughts:  No  Memory:  Immediate;   Fair Recent;   Fair Remote;   Fair  Judgement:  Fair  Insight:  Fair  Psychomotor Activity:  Normal  Concentration:  Good  Recall:  Good  Fund of Knowledge:Good  Language: Good  Akathisia:  No  Handed:    AIMS (if indicated):     Assets:  Communication Skills Desire for Improvement Resilience Social Support  ADL's:  Intact  Cognition: WNL  Sleep:      Medical Decision Making: New problem, with additional work up planned, Review of Psycho-Social Stressors (1), Review or order clinical lab tests (1), Review of Medication Regimen & Side Effects (2) and  Review of New Medication or Change in Dosage (2)   Treatment Plan Summary: Daily contact with patient to assess and evaluate symptoms and progress in treatment and Medication management  Plan:  No evidence of imminent risk to self or others at present.   Patient does not meet criteria for psychiatric inpatient  admission. Supportive therapy provided about ongoing stressors. Refer to IOP. Discussed crisis plan, support from social network, calling 911, coming to the Emergency Department, and calling Suicide Hotline.  Disposition:  -Discharge home with family -Continue home medications -Keep all followup appointments with psychiatrist  Benjamine Mola, FNP-BC 12/26/2014 09:37 AM Case and plan reviewed.

## 2015-01-12 ENCOUNTER — Other Ambulatory Visit (HOSPITAL_COMMUNITY): Payer: Self-pay | Admitting: Obstetrics and Gynecology

## 2015-01-12 DIAGNOSIS — Z09 Encounter for follow-up examination after completed treatment for conditions other than malignant neoplasm: Secondary | ICD-10-CM

## 2015-01-14 ENCOUNTER — Encounter (HOSPITAL_COMMUNITY): Payer: Self-pay | Admitting: Emergency Medicine

## 2015-01-14 ENCOUNTER — Emergency Department (HOSPITAL_COMMUNITY)
Admission: EM | Admit: 2015-01-14 | Discharge: 2015-01-15 | Disposition: A | Discharge status: Home / Self Care | Payer: Medicare Other | Attending: Emergency Medicine | Admitting: Emergency Medicine

## 2015-01-14 DIAGNOSIS — R0602 Shortness of breath: Secondary | ICD-10-CM | POA: Insufficient documentation

## 2015-01-14 DIAGNOSIS — F319 Bipolar disorder, unspecified: Secondary | ICD-10-CM | POA: Insufficient documentation

## 2015-01-14 DIAGNOSIS — Z79899 Other long term (current) drug therapy: Secondary | ICD-10-CM | POA: Diagnosis not present

## 2015-01-14 DIAGNOSIS — Z720 Tobacco use: Secondary | ICD-10-CM | POA: Insufficient documentation

## 2015-01-14 DIAGNOSIS — R2240 Localized swelling, mass and lump, unspecified lower limb: Secondary | ICD-10-CM | POA: Diagnosis not present

## 2015-01-14 DIAGNOSIS — Z85038 Personal history of other malignant neoplasm of large intestine: Secondary | ICD-10-CM | POA: Insufficient documentation

## 2015-01-14 DIAGNOSIS — Z7951 Long term (current) use of inhaled steroids: Secondary | ICD-10-CM | POA: Insufficient documentation

## 2015-01-14 DIAGNOSIS — R079 Chest pain, unspecified: Secondary | ICD-10-CM | POA: Insufficient documentation

## 2015-01-14 DIAGNOSIS — M79669 Pain in unspecified lower leg: Secondary | ICD-10-CM | POA: Diagnosis not present

## 2015-01-14 DIAGNOSIS — E669 Obesity, unspecified: Secondary | ICD-10-CM | POA: Insufficient documentation

## 2015-01-14 DIAGNOSIS — Z9049 Acquired absence of other specified parts of digestive tract: Secondary | ICD-10-CM | POA: Diagnosis not present

## 2015-01-14 DIAGNOSIS — I10 Essential (primary) hypertension: Secondary | ICD-10-CM | POA: Insufficient documentation

## 2015-01-14 DIAGNOSIS — Z791 Long term (current) use of non-steroidal anti-inflammatories (NSAID): Secondary | ICD-10-CM | POA: Insufficient documentation

## 2015-01-14 DIAGNOSIS — G43909 Migraine, unspecified, not intractable, without status migrainosus: Secondary | ICD-10-CM | POA: Insufficient documentation

## 2015-01-14 DIAGNOSIS — E780 Pure hypercholesterolemia: Secondary | ICD-10-CM | POA: Diagnosis not present

## 2015-01-14 NOTE — ED Notes (Addendum)
Pt c/o sob and chest pain while walking home. Pt has lower leg swelling. Pt was given nitro and aspirin enroute to hospital.

## 2015-01-14 NOTE — ED Provider Notes (Addendum)
CSN: [id number]   Arrival date & time 01/14/15 2333  History  This chart was scribed for  Ashley Speak, MD by Altamease Oiler, ED Scribe. This patient was seen in room APA02/APA02 and the patient's care was started at 11:57 PM.  Chief Complaint  Patient presents with  . Shortness of Breath    HPI The history is provided by the patient. No language interpreter was used.   EDYE Ashley Patrick is a 59 y.o. female with PMHx of HTN who presents to the Emergency Department complaining of intermittent chest pain with sudden onset tonight while sitting on a porch. The pain is described as crushing "like an elephant is sitting on it" and located in the center of the chest. Pt rates the pain 6/10 in severity.  She has had similar pain in the past that was less severe. Associated symptoms include LE pain and swelling. Pt denies fever and cough.  No cardiac history. Tobacco+.  Past Medical History  Diagnosis Date  . Back pain   . Obesity     History of  . Nicotine addiction   . Psychosis   . Hypertension   . OD (overdose of drug)     hospitalized for 11 days   . Bipolar 1 disorder   . Hypercholesteremia   . Migraine headache   . Lateral epicondylitis  of elbow     right  . Cancer 2014    Colon Cancer    Past Surgical History  Procedure Laterality Date  . Partial hysterectomy    . Breast biopsy  10/18/2011    Procedure: BREAST BIOPSY;  Surgeon: Jamesetta So, MD;  Location: [facility];  Service: General;  Laterality: Left;  . Esophagogastroduodenoscopy N/A 12/26/2012    Ashley Patrick:VZCHYIF reflux esophagitis. Gastric and duodenal bulbar erosions-status post gastric biopsynegative H.pylori  . Abdominal hysterectomy    . Colonoscopy    . Colonoscopy N/A 05/29/2013    OYD:XAJOINO mass most likely representing colorectal cancer S/ P biospy.Multiple colonic and rectal polyps removed/treated as described above. Colonic diverticulosis  . Colon resection N/A 06/23/2013    Procedure: HAND ASSISTED LAPAROSCOPIC  PARTIAL COLECTOMY;  Surgeon: Jamesetta So, MD;  Location: [facility];  Service: General;  Laterality: N/A;  . Esophagogastroduodenoscopy (egd) with propofol N/A 08/13/2014    RMR: Small benign cystic-appearing lesion in distal esophagus of doubtful clinical significance, otherwise normal esophagus, status post Maloney dilation. Small hiatal hernia, some retained gastric contents (query delayed gastric emptying.)  . Esophageal dilation N/A 08/13/2014    Procedure: [place];  Surgeon: Daneil Dolin, MD;  Location: [facility];  Service: Endoscopy;  Laterality: N/A;  . Colonoscopy with propofol N/A 08/13/2014    RMR: Status post sigmoid colectomy. Multiple colonic polyps removed as described above. Redundant colon. Pan colonic diverticuloisi  . Polypectomy N/A 08/13/2014    Procedure: POLYPECTOMY;  Surgeon: Daneil Dolin, MD;  Location: [facility];  Service: Endoscopy;  Laterality: N/A;  . Esophageal biopsy N/A 08/13/2014    Procedure: BIOPSY;  Surgeon: Daneil Dolin, MD;  Location: [facility];  Service: Endoscopy;  Laterality: N/A;    Family History  Problem Relation Age of Onset  . Anesthesia problems Neg Hx   . Hypotension Neg Hx   . Malignant hyperthermia Neg Hx   . Pseudochol deficiency Neg Hx   . Colon cancer Father     diagnosed in his 34s    History  Substance Use Topics  . Smoking status: Current Every  Day Smoker -- 0.50 packs/day for 38 years    Types: Cigarettes    Last Attempt to Quit: 06/19/2014  . Smokeless tobacco: Never Used     Comment: vapor only  . Alcohol Use: No     Review of Systems 10 Systems reviewed and all are negative for acute change except as noted in the HPI.    Home Medications   Prior to Admission medications   Medication Sig Start Date End Date Taking? Authorizing Provider  amLODipine-benazepril (LOTREL) 10-20 MG per capsule Take 1 capsule by mouth daily.  12/04/14   Historical Provider, MD  clonazePAM (KLONOPIN) 1 MG tablet Take 1 mg  by mouth 2 (two) times daily. 12/18/14   Historical Provider, MD  fluticasone (FLONASE) 50 MCG/ACT nasal spray Place 1-2 sprays into both nostrils daily. 12/16/14   Historical Provider, MD  hydrochlorothiazide (MICROZIDE) 12.5 MG capsule Take 12.5 mg by mouth daily.    Historical Provider, MD  HYDROcodone-acetaminophen (NORCO/VICODIN) 5-325 MG per tablet Take one tab po q 4-6 hrs prn pain Patient not taking: Reported on 12/25/2014 11/04/14   Ashley Triplett, PA-C  lovastatin (MEVACOR) 20 MG tablet Take 20 mg by mouth at bedtime.    Historical Provider, MD  meloxicam (MOBIC) 7.5 MG tablet Take 7.5 mg by mouth daily.    Historical Provider, MD  methocarbamol (ROBAXIN) 500 MG tablet Take 1 tablet (500 mg total) by mouth 3 (three) times daily. 11/04/14   Ashley Triplett, PA-C  mirabegron ER (MYRBETRIQ) 50 MG TB24 tablet Take 50 mg by mouth daily.    Historical Provider, MD  Multiple Vitamin (MULTIVITAMIN WITH MINERALS) TABS tablet Take 1 tablet by mouth daily.    Historical Provider, MD  omeprazole (PRILOSEC) 20 MG capsule TAKE ONE CAPSULE BY MOUTH 30 MINUTES PRIOR TO FIRST MEAL OF THE DAY. 09/23/14   Mahala Menghini, PA-C  PROAIR HFA 108 (90 BASE) MCG/ACT inhaler Inhale 1-2 puffs into the lungs every 6 (six) hours as needed. 12/04/14   Historical Provider, MD  sertraline (ZOLOFT) 100 MG tablet Take 100 mg by mouth daily. 12/18/14   Historical Provider, MD  zolpidem (AMBIEN) 10 MG tablet Take 10 mg by mouth every other day.    Historical Provider, MD    Allergies  Review of patient's allergies indicates no known allergies.  Triage Vitals: BP 124/84 mmHg  Pulse 92  Temp(Src) 98.3 F (36.8 C)  Resp 26  Ht 5\' 4"  (1.626 m)  Wt 215 lb (97.523 kg)  BMI 36.89 kg/m2  SpO2 98%  Physical Exam  Constitutional: She is oriented to person, place, and time. She appears well-developed and well-nourished. No distress.  HENT:  Head: Normocephalic and atraumatic.  Eyes: EOM are normal. Pupils are equal, round, and  reactive to light.  Neck: Neck supple.  Cardiovascular: Normal rate, regular rhythm and normal heart sounds.   No murmur heard. Pulmonary/Chest: Effort normal and breath sounds normal. No respiratory distress. She has no wheezes. She has no rales. She exhibits tenderness.  TTP over the anterior chest wall  Abdominal: Soft. Bowel sounds are normal. She exhibits no distension. There is no tenderness.  Musculoskeletal: Normal range of motion. She exhibits no edema.  Neurological: She is alert and oriented to person, place, and time. No cranial nerve deficit.  Skin: Skin is warm and dry. No rash noted. She is not diaphoretic.  Psychiatric: She has a normal mood and affect. Her behavior is normal.  Nursing note and vitals reviewed.   ED Course  Procedures   DIAGNOSTIC STUDIES: Oxygen Saturation is 98% on RA, normal by my interpretation.    COORDINATION OF CARE: 12:01 AM Discussed treatment plan which includes CXR, lab work, EKG, and Toradol with pt at bedside and pt agreed to plan.  Labs Review-  Labs Reviewed  BASIC METABOLIC PANEL  CBC  TROPONIN I  BRAIN NATRIURETIC PEPTIDE    Imaging Review No results found.  EKG Interpretation  ED ECG REPORT   Date: 01/15/2015  Rate: 92  Rhythm: normal sinus rhythm  QRS Axis: normal  Intervals: normal  ST/T Wave abnormalities: normal  Conduction Disutrbances:none  Narrative Interpretation:   Old EKG Reviewed: unchanged  I have personally reviewed the EKG tracing and agree with the computerized printout as noted.       MDM   Final diagnoses:  None     Patient presents with complaints of chest discomfort that began while sitting on a porch this evening. Her discomfort is reproducible with palpation and EKG and cardiac enzymes are negative despite several hours of pain. I highly doubt a cardiac etiology and believe she is appropriate for discharge. She will be treated with pain medication and advised to return as needed for  any problems.   I personally performed the services described in this documentation, which was scribed in my presence. The recorded information has been reviewed and is accurate.      Ashley Speak, MD 01/15/15 6144  Ashley Speak, MD 01/15/15 3154

## 2015-01-15 ENCOUNTER — Emergency Department (HOSPITAL_COMMUNITY): Payer: Medicare Other

## 2015-01-15 DIAGNOSIS — R079 Chest pain, unspecified: Secondary | ICD-10-CM | POA: Diagnosis not present

## 2015-01-15 LAB — BASIC METABOLIC PANEL
ANION GAP: 9 (ref 5–15)
BUN: 15 mg/dL (ref 6–20)
CO2: 24 mmol/L (ref 22–32)
CREATININE: 0.97 mg/dL (ref 0.44–1.00)
Calcium: 9.3 mg/dL (ref 8.9–10.3)
Chloride: 107 mmol/L (ref 101–111)
GFR calc non Af Amer: >60 mL/min (ref >60)
Glucose, Bld: 97 mg/dL (ref 65–99)
Potassium: 3.7 mmol/L (ref 3.5–5.1)
SODIUM: 140 mmol/L (ref 135–145)

## 2015-01-15 LAB — CBC
HCT: 37.3 % (ref 36.0–46.0)
HEMOGLOBIN: 13 g/dL (ref 12.0–15.0)
MCH: 30.5 pg (ref 26.0–34.0)
MCHC: 34.9 g/dL (ref 30.0–36.0)
MCV: 87.6 fL (ref 78.0–100.0)
Platelets: 176 10*3/uL (ref 150–400)
RBC: 4.26 MIL/uL (ref 3.87–5.11)
RDW: 15.2 % (ref 11.5–15.5)
WBC: 5.2 10*3/uL (ref 4.0–10.5)

## 2015-01-15 LAB — BRAIN NATRIURETIC PEPTIDE: B Natriuretic Peptide: 6 pg/mL (ref 0.0–100.0)

## 2015-01-15 LAB — TROPONIN I: Troponin I: <0.03 ng/mL (ref <0.031)

## 2015-01-15 MED ORDER — HYDROCODONE-ACETAMINOPHEN 5-325 MG PO TABS: 1.0000 | ORAL_TABLET | Freq: Four times a day (QID) | ORAL | Status: DC | PRN

## 2015-01-15 MED ORDER — KETOROLAC TROMETHAMINE 30 MG/ML IJ SOLN
30.0000 mg | Freq: Once | INTRAMUSCULAR | Status: AC
  Administered 2015-01-15: 30 mg via INTRAVENOUS
  Filled 2015-01-15: qty 1

## 2015-01-15 NOTE — Discharge Instructions (Signed)
Ibuprofen 600 mg every 6 hours as needed for pain. Hydrocodone as prescribed as needed for pain not relieved with ibuprofen.  Follow-up with your primary Dr. if not improving in the next few days, and return to the ER symptoms significantly worsen or change.   Chest Pain (Nonspecific) It is often hard to give a specific diagnosis for the cause of chest pain. There is always a chance that your pain could be related to something serious, such as a heart attack or a blood clot in the lungs. You need to follow up with your health care provider for further evaluation. CAUSES   Heartburn.  Pneumonia or bronchitis.  Anxiety or stress.  Inflammation around your heart (pericarditis) or lung (pleuritis or pleurisy).  A blood clot in the lung.  A collapsed lung (pneumothorax). It can develop suddenly on its own (spontaneous pneumothorax) or from trauma to the chest.  Shingles infection (herpes zoster virus). The chest wall is composed of bones, muscles, and cartilage. Any of these can be the source of the pain.  The bones can be bruised by injury.  The muscles or cartilage can be strained by coughing or overwork.  The cartilage can be affected by inflammation and become sore (costochondritis). DIAGNOSIS  Lab tests or other studies may be needed to find the cause of your pain. Your health care provider may have you take a test called an ambulatory electrocardiogram (ECG). An ECG records your heartbeat patterns over a 24-hour period. You may also have other tests, such as:  Transthoracic echocardiogram (TTE). During echocardiography, sound waves are used to evaluate how blood flows through your heart.  Transesophageal echocardiogram (TEE).  Cardiac monitoring. This allows your health care provider to monitor your heart rate and rhythm in real time.  Holter monitor. This is a portable device that records your heartbeat and can help diagnose heart arrhythmias. It allows your health care  provider to track your heart activity for several days, if needed.  Stress tests by exercise or by giving medicine that makes the heart beat faster. TREATMENT   Treatment depends on what may be causing your chest pain. Treatment may include:  Acid blockers for heartburn.  Anti-inflammatory medicine.  Pain medicine for inflammatory conditions.  Antibiotics if an infection is present.  You may be advised to change lifestyle habits. This includes stopping smoking and avoiding alcohol, caffeine, and chocolate.  You may be advised to keep your head raised (elevated) when sleeping. This reduces the chance of acid going backward from your stomach into your esophagus. Most of the time, nonspecific chest pain will improve within 2-3 days with rest and mild pain medicine.  HOME CARE INSTRUCTIONS   If antibiotics were prescribed, take them as directed. Finish them even if you start to feel better.  For the next few days, avoid physical activities that bring on chest pain. Continue physical activities as directed.  Do not use any tobacco products, including cigarettes, chewing tobacco, or electronic cigarettes.  Avoid drinking alcohol.  Only take medicine as directed by your health care provider.  Follow your health care provider's suggestions for further testing if your chest pain does not go away.  Keep any follow-up appointments you made. If you do not go to an appointment, you could develop lasting (chronic) problems with pain. If there is any problem keeping an appointment, call to reschedule. SEEK MEDICAL CARE IF:   Your chest pain does not go away, even after treatment.  You have a rash with blisters  on your chest.  You have a fever. SEEK IMMEDIATE MEDICAL CARE IF:   You have increased chest pain or pain that spreads to your arm, neck, jaw, back, or abdomen.  You have shortness of breath.  You have an increasing cough, or you cough up blood.  You have severe back or  abdominal pain.  You feel nauseous or vomit.  You have severe weakness.  You faint.  You have chills. This is an emergency. Do not wait to see if the pain will go away. Get medical help at once. Call your local emergency services (911 in [place] Do not drive yourself to the hospital. MAKE SURE YOU:   Understand these instructions.  Will watch your condition.  Will get help right away if you are not doing well or get worse. Document Released: 03/15/2005 Document Revised: 06/10/2013 Document Reviewed: 01/09/2008 [facility] Patient Information 2015 [place], [place]. This information is not intended to replace advice given to you by your health care provider. Make sure you discuss any questions you have with your health care provider.

## 2015-01-15 NOTE — ED Notes (Signed)
   01/15/15 0014  Respiratory  Respiratory (WDL) X  Bilateral Breath Sounds Clear  L Breath Sounds Clear  R Breath Sounds Clear  Respiratory Pattern Regular  Chest Assessment Chest expansion symmetrical  O2 Device Room Air

## 2015-01-26 ENCOUNTER — Inpatient Hospital Stay (HOSPITAL_COMMUNITY): Admission: RE | Admit: 2015-01-26 | Payer: Self-pay | Source: Ambulatory Visit

## 2015-04-08 ENCOUNTER — Emergency Department (HOSPITAL_COMMUNITY): Payer: Medicare Other

## 2015-04-08 ENCOUNTER — Encounter (HOSPITAL_COMMUNITY): Payer: Self-pay | Admitting: Emergency Medicine

## 2015-04-08 ENCOUNTER — Emergency Department (HOSPITAL_COMMUNITY)
Admission: EM | Admit: 2015-04-08 | Discharge: 2015-04-08 | Disposition: A | Discharge status: Home / Self Care | Payer: Medicare Other | Attending: Emergency Medicine | Admitting: Emergency Medicine

## 2015-04-08 DIAGNOSIS — E669 Obesity, unspecified: Secondary | ICD-10-CM | POA: Insufficient documentation

## 2015-04-08 DIAGNOSIS — Z791 Long term (current) use of non-steroidal anti-inflammatories (NSAID): Secondary | ICD-10-CM | POA: Insufficient documentation

## 2015-04-08 DIAGNOSIS — Z85038 Personal history of other malignant neoplasm of large intestine: Secondary | ICD-10-CM | POA: Insufficient documentation

## 2015-04-08 DIAGNOSIS — F319 Bipolar disorder, unspecified: Secondary | ICD-10-CM | POA: Diagnosis not present

## 2015-04-08 DIAGNOSIS — Z7951 Long term (current) use of inhaled steroids: Secondary | ICD-10-CM | POA: Insufficient documentation

## 2015-04-08 DIAGNOSIS — Z8739 Personal history of other diseases of the musculoskeletal system and connective tissue: Secondary | ICD-10-CM | POA: Diagnosis not present

## 2015-04-08 DIAGNOSIS — Z79899 Other long term (current) drug therapy: Secondary | ICD-10-CM | POA: Insufficient documentation

## 2015-04-08 DIAGNOSIS — Z720 Tobacco use: Secondary | ICD-10-CM | POA: Diagnosis not present

## 2015-04-08 DIAGNOSIS — R079 Chest pain, unspecified: Secondary | ICD-10-CM | POA: Diagnosis present

## 2015-04-08 DIAGNOSIS — I10 Essential (primary) hypertension: Secondary | ICD-10-CM | POA: Insufficient documentation

## 2015-04-08 DIAGNOSIS — R61 Generalized hyperhidrosis: Secondary | ICD-10-CM | POA: Diagnosis not present

## 2015-04-08 DIAGNOSIS — R0602 Shortness of breath: Secondary | ICD-10-CM | POA: Insufficient documentation

## 2015-04-08 DIAGNOSIS — G43909 Migraine, unspecified, not intractable, without status migrainosus: Secondary | ICD-10-CM | POA: Insufficient documentation

## 2015-04-08 DIAGNOSIS — R1013 Epigastric pain: Secondary | ICD-10-CM | POA: Diagnosis not present

## 2015-04-08 LAB — CBC
HCT: 40.1 % (ref 36.0–46.0)
Hemoglobin: 13.9 g/dL (ref 12.0–15.0)
MCH: 30.2 pg (ref 26.0–34.0)
MCHC: 34.7 g/dL (ref 30.0–36.0)
MCV: 87.2 fL (ref 78.0–100.0)
PLATELETS: 193 10*3/uL (ref 150–400)
RBC: 4.6 MIL/uL (ref 3.87–5.11)
RDW: 14.6 % (ref 11.5–15.5)
WBC: 5.1 10*3/uL (ref 4.0–10.5)

## 2015-04-08 LAB — BASIC METABOLIC PANEL
Anion gap: 10 (ref 5–15)
BUN: 15 mg/dL (ref 6–20)
CALCIUM: 9.7 mg/dL (ref 8.9–10.3)
CO2: 22 mmol/L (ref 22–32)
CREATININE: 1.08 mg/dL — AB (ref 0.44–1.00)
Chloride: 106 mmol/L (ref 101–111)
GFR calc non Af Amer: 56 mL/min — ABNORMAL LOW (ref >60)
Glucose, Bld: 101 mg/dL — ABNORMAL HIGH (ref 65–99)
Potassium: 3.6 mmol/L (ref 3.5–5.1)
Sodium: 138 mmol/L (ref 135–145)

## 2015-04-08 LAB — TROPONIN I

## 2015-04-08 MED ORDER — SODIUM CHLORIDE 0.9 % IV BOLUS (SEPSIS)
1000.0000 mL | Freq: Once | INTRAVENOUS | Status: AC
  Administered 2015-04-08: 1000 mL via INTRAVENOUS

## 2015-04-08 MED ORDER — GI COCKTAIL ~~LOC~~
30.0000 mL | Freq: Once | ORAL | Status: AC
  Administered 2015-04-08: 30 mL via ORAL
  Filled 2015-04-08: qty 30

## 2015-04-08 MED ORDER — IOHEXOL 350 MG/ML SOLN
100.0000 mL | Freq: Once | INTRAVENOUS | Status: AC | PRN
  Administered 2015-04-08: 100 mL via INTRAVENOUS

## 2015-04-08 MED ORDER — LORAZEPAM 2 MG/ML IJ SOLN
1.0000 mg | Freq: Once | INTRAMUSCULAR | Status: AC
  Administered 2015-04-08: 1 mg via INTRAVENOUS
  Filled 2015-04-08: qty 1

## 2015-04-08 MED ORDER — MORPHINE SULFATE (PF) 4 MG/ML IV SOLN
4.0000 mg | Freq: Once | INTRAVENOUS | Status: AC
  Administered 2015-04-08: 4 mg via INTRAVENOUS
  Filled 2015-04-08: qty 1

## 2015-04-08 MED ORDER — IBUPROFEN 800 MG PO TABS
800.0000 mg | ORAL_TABLET | Freq: Once | ORAL | Status: AC
  Administered 2015-04-08: 800 mg via ORAL
  Filled 2015-04-08: qty 1

## 2015-04-08 MED ORDER — FAMOTIDINE 20 MG PO TABS
20.0000 mg | ORAL_TABLET | Freq: Once | ORAL | Status: AC
  Administered 2015-04-08: 20 mg via ORAL
  Filled 2015-04-08: qty 1

## 2015-04-08 MED ORDER — ONDANSETRON HCL 4 MG/2ML IJ SOLN
4.0000 mg | Freq: Once | INTRAMUSCULAR | Status: AC
  Administered 2015-04-08: 4 mg via INTRAVENOUS
  Filled 2015-04-08: qty 2

## 2015-04-08 MED ORDER — ACETAMINOPHEN 500 MG PO TABS
1000.0000 mg | ORAL_TABLET | Freq: Once | ORAL | Status: AC
  Administered 2015-04-08: 1000 mg via ORAL
  Filled 2015-04-08: qty 2

## 2015-04-08 MED ORDER — NITROGLYCERIN 0.4 MG SL SUBL
0.4000 mg | SUBLINGUAL_TABLET | SUBLINGUAL | Status: AC | PRN
  Administered 2015-04-08 (×3): 0.4 mg via SUBLINGUAL
  Filled 2015-04-08: qty 1

## 2015-04-08 NOTE — ED Notes (Signed)
PT c/o central chest pain starting around 1015 this morning and stated crushing pain to chest suddenly with SOB. EMS stated they gave 324mg  aspirin in route and 2 nirtoglycerin with some relief in route.  2

## 2015-04-08 NOTE — Discharge Instructions (Signed)
Try zantac twice a day.  Nonspecific Chest Pain  Chest pain can be caused by many different conditions. There is always a chance that your pain could be related to something serious, such as a heart attack or a blood clot in your lungs. Chest pain can also be caused by conditions that are not life-threatening. If you have chest pain, it is very important to follow up with your health care provider. CAUSES  Chest pain can be caused by:  Heartburn.  Pneumonia or bronchitis.  Anxiety or stress.  Inflammation around your heart (pericarditis) or lung (pleuritis or pleurisy).  A blood clot in your lung.  A collapsed lung (pneumothorax). It can develop suddenly on its own (spontaneous pneumothorax) or from trauma to the chest.  Shingles infection (varicella-zoster virus).  Heart attack.  Damage to the bones, muscles, and cartilage that make up your chest wall. This can include:  Bruised bones due to injury.  Strained muscles or cartilage due to frequent or repeated coughing or overwork.  Fracture to one or more ribs.  Sore cartilage due to inflammation (costochondritis). RISK FACTORS  Risk factors for chest pain may include:  Activities that increase your risk for trauma or injury to your chest.  Respiratory infections or conditions that cause frequent coughing.  Medical conditions or overeating that can cause heartburn.  Heart disease or family history of heart disease.  Conditions or health behaviors that increase your risk of developing a blood clot.  Having had chicken pox (varicella zoster). SIGNS AND SYMPTOMS Chest pain can feel like:  Burning or tingling on the surface of your chest or deep in your chest.  Crushing, pressure, aching, or squeezing pain.  Dull or sharp pain that is worse when you move, cough, or take a deep breath.  Pain that is also felt in your back, neck, shoulder, or arm, or pain that spreads to any of these areas. Your chest pain may come and  go, or it may stay constant. DIAGNOSIS Lab tests or other studies may be needed to find the cause of your pain. Your health care provider may have you take a test called an ambulatory ECG (electrocardiogram). An ECG records your heartbeat patterns at the time the test is performed. You may also have other tests, such as:  Transthoracic echocardiogram (TTE). During echocardiography, sound waves are used to create a picture of all of the heart structures and to look at how blood flows through your heart.  Transesophageal echocardiogram (TEE).This is a more advanced imaging test that obtains images from inside your body. It allows your health care provider to see your heart in finer detail.  Cardiac monitoring. This allows your health care provider to monitor your heart rate and rhythm in real time.  Holter monitor. This is a portable device that records your heartbeat and can help to diagnose abnormal heartbeats. It allows your health care provider to track your heart activity for several days, if needed.  Stress tests. These can be done through exercise or by taking medicine that makes your heart beat more quickly.  Blood tests.  Imaging tests. TREATMENT  Your treatment depends on what is causing your chest pain. Treatment may include:  Medicines. These may include:  Acid blockers for heartburn.  Anti-inflammatory medicine.  Pain medicine for inflammatory conditions.  Antibiotic medicine, if an infection is present.  Medicines to dissolve blood clots.  Medicines to treat coronary artery disease.  Supportive care for conditions that do not require medicines. This may  include:  Resting.  Applying heat or cold packs to injured areas.  Limiting activities until pain decreases. HOME CARE INSTRUCTIONS  If you were prescribed an antibiotic medicine, finish it all even if you start to feel better.  Avoid any activities that bring on chest pain.  Do not use any tobacco products,  including cigarettes, chewing tobacco, or electronic cigarettes. If you need help quitting, ask your health care provider.  Do not drink alcohol.  Take medicines only as directed by your health care provider.  Keep all follow-up visits as directed by your health care provider. This is important. This includes any further testing if your chest pain does not go away.  If heartburn is the cause for your chest pain, you may be told to keep your head raised (elevated) while sleeping. This reduces the chance that acid will go from your stomach into your esophagus.  Make lifestyle changes as directed by your health care provider. These may include:  Getting regular exercise. Ask your health care provider to suggest some activities that are safe for you.  Eating a heart-healthy diet. A registered dietitian can help you to learn healthy eating options.  Maintaining a healthy weight.  Managing diabetes, if necessary.  Reducing stress. SEEK MEDICAL CARE IF:  Your chest pain does not go away after treatment.  You have a rash with blisters on your chest.  You have a fever. SEEK IMMEDIATE MEDICAL CARE IF:   Your chest pain is worse.  You have an increasing cough, or you cough up blood.  You have severe abdominal pain.  You have severe weakness.  You faint.  You have chills.  You have sudden, unexplained chest discomfort.  You have sudden, unexplained discomfort in your arms, back, neck, or jaw.  You have shortness of breath at any time.  You suddenly start to sweat, or your skin gets clammy.  You feel nauseous or you vomit.  You suddenly feel light-headed or dizzy.  Your heart begins to beat quickly, or it feels like it is skipping beats. These symptoms may represent a serious problem that is an emergency. Do not wait to see if the symptoms will go away. Get medical help right away. Call your local emergency services (911 in the [place] Do not drive yourself to the hospital.    This information is not intended to replace advice given to you by your health care provider. Make sure you discuss any questions you have with your health care provider.   Document Released: 03/15/2005 Document Revised: 06/26/2014 Document Reviewed: 01/09/2014 [employer] Interactive Patient Education [employer].

## 2015-04-08 NOTE — ED Provider Notes (Signed)
CSN: [id number]     Arrival date & time 04/08/15  1118 History  By signing my name below, I, Hansel Feinstein, attest that this documentation has been prepared under the direction and in the presence of Deno Etienne, DO. Electronically Signed: Hansel Feinstein, ED Scribe. 04/08/2015. 12:37 PM.    Chief Complaint  Patient presents with  . Chest Pain   Patient is a 59 y.o. female presenting with chest pain. The history is provided by the patient. No language interpreter was used.  Chest Pain Pain location:  Substernal area Pain quality: crushing   Pain radiates to:  Does not radiate Pain radiates to the back: no   Pain severity:  Moderate Duration:  2 hours Progression:  Unchanged Chronicity:  New Context: breathing and at rest   Relieved by:  Nitroglycerin Worsened by:  Deep breathing Associated symptoms: diaphoresis and shortness of breath   Associated symptoms: no cough, no dizziness, no fever, no headache, no nausea, no palpitations and not vomiting   Risk factors: hypertension and smoking   Risk factors: no diabetes mellitus, no high cholesterol, no prior DVT/PE and no surgery     HPI Comments: Ashley Patrick is a 59 y.o. female with h/o HTN who presents to the Emergency Department complaining of moderate, waxing and waning central, crushing, non-radiating 9/10 CP onset 1.5 hours ago while standing in line with associated SOB, diaphoresis. She notes that pain is worsened when she takes deep breaths. She reports mild relief with NTG en route. Pt states this is similar to a panic attack, but more severe. She states she had not eaten prior to onset of symptoms. No h/o PE/DVT, recent travel, surgery. No h/o DM, HLD. No FHx of CAF/CHF, MI. H/o colon cancer that is in remission. Pt is a current smoker. She denies nausea, emesis, cough, fever, chills, leg swelling.   Past Medical History  Diagnosis Date  . Back pain   . Obesity     History of  . Nicotine addiction   . Psychosis   . Hypertension    . OD (overdose of drug)     hospitalized for 11 days   . Bipolar 1 disorder ([place])   . Hypercholesteremia   . Migraine headache   . Lateral epicondylitis  of elbow     right  . Cancer [facility]) 2014    Colon Cancer   Past Surgical History  Procedure Laterality Date  . Partial hysterectomy    . Breast biopsy  10/18/2011    Procedure: BREAST BIOPSY;  Surgeon: Jamesetta So, MD;  Location: [facility];  Service: General;  Laterality: Left;  . Esophagogastroduodenoscopy N/A 12/26/2012    [id number] reflux esophagitis. Gastric and duodenal bulbar erosions-status post gastric biopsynegative H.pylori  . Abdominal hysterectomy    . Colonoscopy    . Colonoscopy N/A 05/29/2013    [id number] mass most likely representing colorectal cancer S/ P biospy.Multiple colonic and rectal polyps removed/treated as described above. Colonic diverticulosis  . Colon resection N/A 06/23/2013    Procedure: HAND ASSISTED LAPAROSCOPIC PARTIAL COLECTOMY;  Surgeon: Jamesetta So, MD;  Location: [facility];  Service: General;  Laterality: N/A;  . Esophagogastroduodenoscopy (egd) with propofol N/A 08/13/2014    RMR: Small benign cystic-appearing lesion in distal esophagus of doubtful clinical significance, otherwise normal esophagus, status post Maloney dilation. Small hiatal hernia, some retained gastric contents (query delayed gastric emptying.)  . Esophageal dilation N/A 08/13/2014    Procedure: [place];  Surgeon: Cristopher Estimable  Rourk, MD;  Location: [facility];  Service: Endoscopy;  Laterality: N/A;  . Colonoscopy with propofol N/A 08/13/2014    RMR: Status post sigmoid colectomy. Multiple colonic polyps removed as described above. Redundant colon. Pan colonic diverticuloisi  . Polypectomy N/A 08/13/2014    Procedure: POLYPECTOMY;  Surgeon: Daneil Dolin, MD;  Location: [facility];  Service: Endoscopy;  Laterality: N/A;  . Esophageal biopsy N/A 08/13/2014    Procedure: BIOPSY;  Surgeon: Daneil Dolin, MD;   Location: [facility];  Service: Endoscopy;  Laterality: N/A;   Family History  Problem Relation Age of Onset  . Anesthesia problems Neg Hx   . Hypotension Neg Hx   . Malignant hyperthermia Neg Hx   . Pseudochol deficiency Neg Hx   . Colon cancer Father     diagnosed in his 26s   Social History  Substance Use Topics  . Smoking status: Current Every Day Smoker -- 0.50 packs/day for 38 years    Types: Cigarettes    Last Attempt to Quit: 06/19/2014  . Smokeless tobacco: Never Used     Comment: vapor only  . Alcohol Use: No   OB History    Gravida Para Term Preterm AB TAB SAB Ectopic Multiple Living   4 3 3  1 1    3      Review of Systems  Constitutional: Positive for diaphoresis. Negative for fever and chills.  HENT: Negative for congestion and rhinorrhea.   Eyes: Negative for redness and visual disturbance.  Respiratory: Positive for shortness of breath. Negative for cough and wheezing.   Cardiovascular: Positive for chest pain. Negative for palpitations and leg swelling.  Gastrointestinal: Negative for nausea and vomiting.  Genitourinary: Negative for dysuria and urgency.  Musculoskeletal: Negative for myalgias and arthralgias.  Skin: Negative for pallor and wound.  Neurological: Negative for dizziness and headaches.  All other systems reviewed and are negative.  Allergies  Review of patient's allergies indicates no known allergies.  Home Medications   Prior to Admission medications   Medication Sig Start Date End Date Taking? Authorizing Provider  amLODipine-benazepril (LOTREL) 10-20 MG per capsule Take 1 capsule by mouth daily.  12/04/14  Yes Historical Provider, MD  clonazePAM (KLONOPIN) 1 MG tablet Take 1 mg by mouth 2 (two) times daily. 12/18/14  Yes Historical Provider, MD  fluticasone (FLONASE) 50 MCG/ACT nasal spray Place 1-2 sprays into both nostrils daily. 12/16/14  Yes Historical Provider, MD  hydrochlorothiazide (MICROZIDE) 12.5 MG capsule Take 12.5 mg by mouth  daily.   Yes Historical Provider, MD  HYDROcodone-acetaminophen (NORCO) 5-325 MG per tablet Take 1-2 tablets by mouth every 6 (six) hours as needed. 01/15/15  Yes Veryl Speak, MD  LATUDA 40 MG TABS tablet Take 40 mg by mouth at bedtime.  03/22/15  Yes Historical Provider, MD  meloxicam (MOBIC) 7.5 MG tablet Take 7.5 mg by mouth daily.   Yes Historical Provider, MD  methocarbamol (ROBAXIN) 500 MG tablet Take 1 tablet (500 mg total) by mouth 3 (three) times daily. 11/04/14  Yes Tammy Triplett, PA-C  Multiple Vitamin (MULTIVITAMIN WITH MINERALS) TABS tablet Take 1 tablet by mouth daily.   Yes Historical Provider, MD  omeprazole (PRILOSEC) 20 MG capsule TAKE ONE CAPSULE BY MOUTH 30 MINUTES PRIOR TO FIRST MEAL OF THE DAY. 09/23/14  Yes Mahala Menghini, PA-C  PROAIR HFA 108 (90 BASE) MCG/ACT inhaler Inhale 1-2 puffs into the lungs every 6 (six) hours as needed. 12/04/14  Yes Historical Provider, MD  sertraline (ZOLOFT) 100  MG tablet Take 100 mg by mouth daily. 12/18/14  Yes Historical Provider, MD  zolpidem (AMBIEN) 10 MG tablet Take 10 mg by mouth every other day.   Yes Historical Provider, MD   BP 145/91 mmHg  Pulse 80  Temp(Src) 97.5 F (36.4 C) (Oral)  Resp 18  Ht 5\' 6"  (1.676 m)  Wt 208 lb (94.348 kg)  BMI 33.59 kg/m2  SpO2 98% Physical Exam  Constitutional: She is oriented to person, place, and time. She appears well-developed and well-nourished.  HENT:  Head: Normocephalic and atraumatic.  Eyes: Conjunctivae and EOM are normal. Pupils are equal, round, and reactive to light.  Neck: Normal range of motion. Neck supple.  Cardiovascular: Normal rate, regular rhythm and normal heart sounds.  Exam reveals no gallop and no friction rub.   No murmur heard. Pulmonary/Chest: Effort normal. Tachypnea noted. No respiratory distress.  Lungs CTA bilaterally.   Abdominal: Soft. Bowel sounds are normal. She exhibits no distension. There is tenderness.  Epigastric tenderness; no RUQ tenderness.    Musculoskeletal: Normal range of motion.  Neurological: She is alert and oriented to person, place, and time.  Skin: Skin is warm and dry.  Psychiatric: She has a normal mood and affect. Her behavior is normal.  Nursing note and vitals reviewed.  ED Course  Procedures (including critical care time) DIAGNOSTIC STUDIES: Oxygen Saturation is 100% on RA, normal by my interpretation.    COORDINATION OF CARE: 12:16 PM Discussed treatment plan with pt at bedside and pt agreed to plan.   Labs Review Labs Reviewed  BASIC METABOLIC PANEL - Abnormal; Notable for the following:    Glucose, Bld 101 (*)    Creatinine, Ser 1.08 (*)    GFR calc non Af Amer 56 (*)    All other components within normal limits  CBC  TROPONIN I  TROPONIN I    Imaging Review Dg Chest 2 View  04/08/2015  CLINICAL DATA:  h/o HTN moderate, waxing and waning central, crushing, non-radiating 9/10 CP onset 1.5 hours ago while standing in line with associated SOB, diaphoresis. similar to a panic attack, but more severe. EXAM: CHEST  2 VIEW COMPARISON:  01/15/2015 FINDINGS: Midline trachea. Normal heart size and mediastinal contours. No pleural effusion or pneumothorax. Diffuse peribronchial thickening. Clear lungs. Minimal convex right thoracolumbar spine curvature. IMPRESSION: 1.  No acute cardiopulmonary disease. 2. Mild peribronchial thickening which may relate to chronic bronchitis or smoking. Electronically Signed   By: Abigail Miyamoto M.D.   On: 04/08/2015 12:48   Ct Angio Chest Pe W/cm &/or Wo Cm  04/08/2015  CLINICAL DATA:  Mid chest pain began today. Associated shortness of breath. EXAM: CT ANGIOGRAPHY CHEST WITH CONTRAST TECHNIQUE: Multidetector CT imaging of the chest was performed using the standard protocol during bolus administration of intravenous contrast. Multiplanar CT image reconstructions and MIPs were obtained to evaluate the vascular anatomy. CONTRAST:  14mL OMNIPAQUE IOHEXOL 350 MG/ML SOLN COMPARISON:   None. FINDINGS: Mediastinum/Nodes: No breast masses, supraclavicular or axillary lymphadenopathy. Small scattered lymph nodes are noted. The heart is normal in size. No pericardial effusion. The aorta is normal in caliber. No dissection. Coronary artery calcifications are noted. The esophagus is grossly normal. No mediastinal or hilar mass or adenopathy. Small scattered lymph nodes are noted. The pulmonary arterial tree is fairly well opacified. No filling defects to suggest pulmonary embolism. Lungs/Pleura: Emphysematous changes are noted. No acute pulmonary findings. No pleural effusion. No worrisome pulmonary nodules. The tracheobronchial tree is normal. Upper abdomen: Small  hepatic cysts are noted. No significant upper abdominal findings. Musculoskeletal: No significant bony findings. Review of the MIP images confirms the above findings. IMPRESSION: 1. No CT findings for pulmonary embolism. 2. Normal thoracic aorta. 3. No acute pulmonary findings. 4. Coronary artery calcifications. Electronically Signed   By: Marijo Sanes M.D.   On: 04/08/2015 15:26   I have personally reviewed and evaluated these images and lab results as part of my medical decision-making.   EKG Interpretation   Date/Time:  Thursday April 08 2015 11:24:44 EDT Ventricular Rate:  87 PR Interval:  129 QRS Duration: 93 QT Interval:  406 QTC Calculation: 488 R Axis:   52 Text Interpretation:  Sinus rhythm Non-specific ST-t changes Abnormal ekg  artifact noted Confirmed by Christy Gentles  MD, DONALD ([id number]) on 04/08/2015  11:30:36 AM    EKG Interpretation  Date/Time:  Thursday April 08 2015 13:01:40 EDT Ventricular Rate:  75 PR Interval:  151 QRS Duration: 87 QT Interval:  438 QTC Calculation: 489 R Axis:   35 Text Interpretation:  Sinus rhythm Borderline prolonged QT interval No significant change since last tracing Confirmed by Keelan Pomerleau MD, Quillian Quince ([id number]) on 04/08/2015 1:04:18 PM          MDM   Final diagnoses:   Chest pain, unspecified chest pain type    59 yo F with a chief complaint of chest pain. Started about 2 hours ago. Feels like a pressure is across her chest. Patient denies radiation denies diaphoresis. Patient has been short of breath with this. Denies nausea or vomiting. Patient smokes his hypertension hyperlipidemia.  Denies family history. Denies PE risk factors, other than remote history of colon cancer.   Patient with continued chest pain after nitros aspirin, continues to be significantly anxious after Ativan. Repeat EKG unchanged from prior today. When comparing today's EKGs to prior mild flattening of the ST segment. Will obtain a CT PE scan. Give morphine for pain.   Pain well controlled, initially but returned.  CT negative, delta trop negative, ecg without changes.  Discussed results with patient will have her follow up with PCP.  Patient requesting further narcotics, discussed that there is no current life threat based on her evaluation, will take zantac, nsaids as needed.   I personally performed the services described in this documentation, which was scribed in my presence. The recorded information has been reviewed and is accurate.     I have discussed the diagnosis/risks/treatment options with the patient and family and believe the pt to be eligible for discharge home to follow-up with PCP. We also discussed returning to the ED immediately if new or worsening sx occur. We discussed the sx which are most concerning (e.g., sudden worsening pain, fever, inability to tolerate by mouth) that necessitate immediate return. Medications administered to the patient during their visit and any new prescriptions provided to the patient are listed below.  Medications given during this visit Medications  nitroGLYCERIN (NITROSTAT) SL tablet 0.4 mg (0.4 mg Sublingual Given 04/08/15 1243)  LORazepam (ATIVAN) injection 1 mg (1 mg Intravenous Given 04/08/15 1231)  sodium chloride 0.9 % bolus 1,000  mL (0 mLs Intravenous Stopped 04/08/15 1330)  gi cocktail (Maalox,Lidocaine,Donnatal) (30 mLs Oral Given 04/08/15 1231)  morphine 4 MG/ML injection 4 mg (4 mg Intravenous Given 04/08/15 1312)  ondansetron (ZOFRAN) injection 4 mg (4 mg Intravenous Given 04/08/15 1312)  iohexol (OMNIPAQUE) 350 MG/ML injection 100 mL (100 mLs Intravenous Contrast Given 04/08/15 1454)  famotidine (PEPCID) tablet 20 mg (20 mg Oral Given  04/08/15 1559)  acetaminophen (TYLENOL) tablet 1,000 mg (1,000 mg Oral Given 04/08/15 1559)  ibuprofen (ADVIL,MOTRIN) tablet 800 mg (800 mg Oral Given 04/08/15 1559)    Discharge Medication List as of 04/08/2015  3:30 PM      The patient appears reasonably screen and/or stabilized for discharge and I doubt any other medical condition or other [facility] requiring further screening, evaluation, or treatment in the ED at this time prior to discharge.    Deno Etienne, DO 04/08/15 [id number]

## 2015-05-16 ENCOUNTER — Emergency Department (HOSPITAL_COMMUNITY)
Admission: EM | Admit: 2015-05-16 | Discharge: 2015-05-16 | Discharge status: Against Medical Advice | Payer: Medicare Other | Attending: Emergency Medicine | Admitting: Emergency Medicine

## 2015-05-16 ENCOUNTER — Emergency Department (HOSPITAL_COMMUNITY): Payer: Medicare Other

## 2015-05-16 ENCOUNTER — Encounter (HOSPITAL_COMMUNITY): Payer: Self-pay | Admitting: *Deleted

## 2015-05-16 DIAGNOSIS — J209 Acute bronchitis, unspecified: Secondary | ICD-10-CM | POA: Diagnosis not present

## 2015-05-16 DIAGNOSIS — Z85038 Personal history of other malignant neoplasm of large intestine: Secondary | ICD-10-CM | POA: Insufficient documentation

## 2015-05-16 DIAGNOSIS — Z79899 Other long term (current) drug therapy: Secondary | ICD-10-CM | POA: Insufficient documentation

## 2015-05-16 DIAGNOSIS — F319 Bipolar disorder, unspecified: Secondary | ICD-10-CM | POA: Insufficient documentation

## 2015-05-16 DIAGNOSIS — Z8739 Personal history of other diseases of the musculoskeletal system and connective tissue: Secondary | ICD-10-CM | POA: Insufficient documentation

## 2015-05-16 DIAGNOSIS — Z791 Long term (current) use of non-steroidal anti-inflammatories (NSAID): Secondary | ICD-10-CM | POA: Insufficient documentation

## 2015-05-16 DIAGNOSIS — Z7951 Long term (current) use of inhaled steroids: Secondary | ICD-10-CM | POA: Diagnosis not present

## 2015-05-16 DIAGNOSIS — E669 Obesity, unspecified: Secondary | ICD-10-CM | POA: Diagnosis not present

## 2015-05-16 DIAGNOSIS — I10 Essential (primary) hypertension: Secondary | ICD-10-CM | POA: Diagnosis not present

## 2015-05-16 DIAGNOSIS — F17210 Nicotine dependence, cigarettes, uncomplicated: Secondary | ICD-10-CM | POA: Diagnosis not present

## 2015-05-16 DIAGNOSIS — R05 Cough: Secondary | ICD-10-CM | POA: Diagnosis present

## 2015-05-16 MED ORDER — AEROCHAMBER Z-STAT PLUS/MEDIUM MISC: 1.0000 | Freq: Once | Status: DC

## 2015-05-16 MED ORDER — ALBUTEROL (5 MG/ML) CONTINUOUS INHALATION SOLN
10.0000 mg/h | INHALATION_SOLUTION | Freq: Once | RESPIRATORY_TRACT | Status: AC
  Administered 2015-05-16: 10 mg/h via RESPIRATORY_TRACT
  Filled 2015-05-16: qty 20

## 2015-05-16 MED ORDER — GUAIFENESIN-DM 100-10 MG/5ML PO SYRP
5.0000 mL | ORAL_SOLUTION | Freq: Once | ORAL | Status: AC
  Administered 2015-05-16: 5 mL via ORAL
  Filled 2015-05-16: qty 5

## 2015-05-16 MED ORDER — ALBUTEROL (5 MG/ML) CONTINUOUS INHALATION SOLN: 10.0000 mg/h | INHALATION_SOLUTION | RESPIRATORY_TRACT | Status: DC

## 2015-05-16 MED ORDER — IPRATROPIUM BROMIDE 0.02 % IN SOLN
0.5000 mg | Freq: Once | RESPIRATORY_TRACT | Status: AC
  Administered 2015-05-16: 0.5 mg via RESPIRATORY_TRACT
  Filled 2015-05-16: qty 2.5

## 2015-05-16 MED ORDER — ALBUTEROL SULFATE (2.5 MG/3ML) 0.083% IN NEBU
5.0000 mg | INHALATION_SOLUTION | Freq: Once | RESPIRATORY_TRACT | Status: AC
  Administered 2015-05-16: 5 mg via RESPIRATORY_TRACT
  Filled 2015-05-16: qty 6

## 2015-05-16 MED ORDER — PREDNISONE 50 MG PO TABS
60.0000 mg | ORAL_TABLET | Freq: Once | ORAL | Status: AC
  Administered 2015-05-16: 60 mg via ORAL
  Filled 2015-05-16: qty 1

## 2015-05-16 MED ORDER — ALBUTEROL SULFATE HFA 108 (90 BASE) MCG/ACT IN AERS
2.0000 | INHALATION_SPRAY | RESPIRATORY_TRACT | Status: DC | PRN
  Administered 2015-05-16: 2 via RESPIRATORY_TRACT
  Filled 2015-05-16: qty 6.7

## 2015-05-16 NOTE — ED Notes (Signed)
Pt presents with flu-like symptoms. Pt has been having chills, cough, and generalized soreness. Pt denies n/v/d.

## 2015-05-16 NOTE — ED Notes (Signed)
Pt receiving continuous neb.  No distress.  States she is feeling better.

## 2015-05-16 NOTE — ED Notes (Signed)
Pt requesting another breathing treatment.  Continue to auscultate wheezing.  Orders received from Dr. Eulis Foster.

## 2015-05-16 NOTE — ED Notes (Signed)
Pt states her breathing is  A little better.

## 2015-05-16 NOTE — ED Provider Notes (Signed)
CSN: [id number]     Arrival date & time 05/16/15  [id number] History  By signing my name below, I, Stephania Fragmin, attest that this documentation has been prepared under the direction and in the presence of Daleen Bo, MD. Electronically Signed: Stephania Fragmin, ED Scribe. 05/16/2015. 10:41 AM.   Chief Complaint  Patient presents with  . Cough   The history is provided by the patient. No language interpreter was used.   HPI Comments: Ashley Patrick is a 59 y.o. female who presents to the Emergency Department complaining of a gradual-onset, constant, worsening, non-productive cough that began 3 days ago. She also complains of associated chills and generalized myalgias. Patient states she had the flu vaccine about 6 days ago. Patient is a smoker. She denies a history of bronchitis or asthma. She denies vomiting.   PCP: Dr. Wendie Simmer   Past Medical History  Diagnosis Date  . Back pain   . Obesity     History of  . Nicotine addiction   . Psychosis   . Hypertension   . OD (overdose of drug)     hospitalized for 11 days   . Bipolar 1 disorder ([place])   . Hypercholesteremia   . Migraine headache   . Lateral epicondylitis  of elbow     right  . Cancer [facility]) 2014    Colon Cancer   Past Surgical History  Procedure Laterality Date  . Partial hysterectomy    . Breast biopsy  10/18/2011    Procedure: BREAST BIOPSY;  Surgeon: Jamesetta So, MD;  Location: [facility];  Service: General;  Laterality: Left;  . Esophagogastroduodenoscopy N/A 12/26/2012    [id number] reflux esophagitis. Gastric and duodenal bulbar erosions-status post gastric biopsynegative H.pylori  . Abdominal hysterectomy    . Colonoscopy    . Colonoscopy N/A 05/29/2013    [id number] mass most likely representing colorectal cancer S/ P biospy.Multiple colonic and rectal polyps removed/treated as described above. Colonic diverticulosis  . Colon resection N/A 06/23/2013    Procedure: HAND ASSISTED LAPAROSCOPIC PARTIAL COLECTOMY;   Surgeon: Jamesetta So, MD;  Location: [facility];  Service: General;  Laterality: N/A;  . Esophagogastroduodenoscopy (egd) with propofol N/A 08/13/2014    RMR: Small benign cystic-appearing lesion in distal esophagus of doubtful clinical significance, otherwise normal esophagus, status post Maloney dilation. Small hiatal hernia, some retained gastric contents (query delayed gastric emptying.)  . Esophageal dilation N/A 08/13/2014    Procedure: [place];  Surgeon: Daneil Dolin, MD;  Location: [facility];  Service: Endoscopy;  Laterality: N/A;  . Colonoscopy with propofol N/A 08/13/2014    RMR: Status post sigmoid colectomy. Multiple colonic polyps removed as described above. Redundant colon. Pan colonic diverticuloisi  . Polypectomy N/A 08/13/2014    Procedure: POLYPECTOMY;  Surgeon: Daneil Dolin, MD;  Location: [facility];  Service: Endoscopy;  Laterality: N/A;  . Esophageal biopsy N/A 08/13/2014    Procedure: BIOPSY;  Surgeon: Daneil Dolin, MD;  Location: [facility];  Service: Endoscopy;  Laterality: N/A;   Family History  Problem Relation Age of Onset  . Anesthesia problems Neg Hx   . Hypotension Neg Hx   . Malignant hyperthermia Neg Hx   . Pseudochol deficiency Neg Hx   . Colon cancer Father     diagnosed in his 44s   Social History  Substance Use Topics  . Smoking status: Current Every Day Smoker -- 0.50 packs/day for 38 years    Types: Cigarettes  Last Attempt to Quit: 06/19/2014  . Smokeless tobacco: Never Used     Comment: vapor only  . Alcohol Use: No   OB History    Gravida Para Term Preterm AB TAB SAB Ectopic Multiple Living   4 3 3  1 1    3      Review of Systems  All other systems reviewed and are negative.  Allergies  Review of patient's allergies indicates no known allergies.  Home Medications   Prior to Admission medications   Medication Sig Start Date End Date Taking? Authorizing Provider  amLODipine-benazepril (LOTREL) 10-20 MG per  capsule Take 1 capsule by mouth daily.  12/04/14   Historical Provider, MD  clonazePAM (KLONOPIN) 1 MG tablet Take 1 mg by mouth 2 (two) times daily. 12/18/14   Historical Provider, MD  fluticasone (FLONASE) 50 MCG/ACT nasal spray Place 1-2 sprays into both nostrils daily. 12/16/14   Historical Provider, MD  hydrochlorothiazide (MICROZIDE) 12.5 MG capsule Take 12.5 mg by mouth daily.    Historical Provider, MD  HYDROcodone-acetaminophen (NORCO) 5-325 MG per tablet Take 1-2 tablets by mouth every 6 (six) hours as needed. 01/15/15   Veryl Speak, MD  LATUDA 40 MG TABS tablet Take 40 mg by mouth at bedtime.  03/22/15   Historical Provider, MD  meloxicam (MOBIC) 7.5 MG tablet Take 7.5 mg by mouth daily.    Historical Provider, MD  methocarbamol (ROBAXIN) 500 MG tablet Take 1 tablet (500 mg total) by mouth 3 (three) times daily. 11/04/14   Tammy Triplett, PA-C  Multiple Vitamin (MULTIVITAMIN WITH MINERALS) TABS tablet Take 1 tablet by mouth daily.    Historical Provider, MD  omeprazole (PRILOSEC) 20 MG capsule TAKE ONE CAPSULE BY MOUTH 30 MINUTES PRIOR TO FIRST MEAL OF THE DAY. 09/23/14   Mahala Menghini, PA-C  PROAIR HFA 108 (90 BASE) MCG/ACT inhaler Inhale 1-2 puffs into the lungs every 6 (six) hours as needed. 12/04/14   Historical Provider, MD  sertraline (ZOLOFT) 100 MG tablet Take 100 mg by mouth daily. 12/18/14   Historical Provider, MD  zolpidem (AMBIEN) 10 MG tablet Take 10 mg by mouth every other day.    Historical Provider, MD   BP 154/81 mmHg  Pulse 101  Temp(Src) 98.4 F (36.9 C) (Oral)  Resp 24  Ht 5\' 6"  (1.676 m)  Wt 204 lb (92.534 kg)  BMI 32.94 kg/m2  SpO2 97% Physical Exam  Constitutional: She is oriented to person, place, and time. She appears well-developed and well-nourished.  HENT:  Head: Normocephalic and atraumatic.  Right Ear: External ear normal.  Left Ear: External ear normal.  Eyes: Conjunctivae and EOM are normal. Pupils are equal, round, and reactive to light.  Neck: Normal  range of motion and phonation normal. Neck supple.  Cardiovascular: Normal rate, regular rhythm and normal heart sounds.   Pulmonary/Chest: Effort normal. She has wheezes. She exhibits no bony tenderness.  Poor air movement. Generalized wheezing.   Abdominal: Soft. There is no tenderness.  Musculoskeletal: Normal range of motion.  Neurological: She is alert and oriented to person, place, and time. No cranial nerve deficit or sensory deficit. She exhibits normal muscle tone. Coordination normal.  Skin: Skin is warm, dry and intact.  Psychiatric: She has a normal mood and affect. Her behavior is normal. Judgment and thought content normal.  Nursing note and vitals reviewed.   ED Course  Procedures (including critical care time)  DIAGNOSTIC STUDIES: Oxygen Saturation is 97% on RA, normal by my interpretation.  COORDINATION OF CARE: 8:59 AM - Discussed treatment plan with pt at bedside which includes CXR and breathing treatments. Pt verbalized understanding and agreed to plan.   10:40 AM - On recheck, pt still has poor air movement and generalized wheezing. Will give a second breathing treatment before recheck.  Medications  albuterol (PROVENTIL) (2.5 MG/3ML) 0.083% nebulizer solution 5 mg (5 mg Nebulization Given 05/16/15 0910)  ipratropium (ATROVENT) nebulizer solution 0.5 mg (0.5 mg Nebulization Given 05/16/15 0910)  albuterol (PROVENTIL,VENTOLIN) solution continuous neb (10 mg/hr Nebulization Given 05/16/15 1106)  predniSONE (DELTASONE) tablet 60 mg (60 mg Oral Given 05/16/15 1102)  guaiFENesin-dextromethorphan (ROBITUSSIN DM) 100-10 MG/5ML syrup 5 mL (5 mLs Oral Given 05/16/15 1234)    No data found.   Left AMA without instructions or prescriptions.    Imaging Review Dg Chest 2 View  05/16/2015  CLINICAL DATA: 59 year old female with history of cough for the past 3 days. Chest congestion. EXAM: CHEST  2 VIEW COMPARISON:  Chest x-ray 04/08/2015. FINDINGS: Mild diffuse  peribronchial cuffing. Lung volumes are normal. No consolidative airspace disease. No pleural effusions. No pneumothorax. No pulmonary nodule or mass noted. Pulmonary vasculature and the cardiomediastinal silhouette are within normal limits. IMPRESSION: 1. Mild diffuse peribronchial cuffing, suggestive of acute bronchitis. Electronically Signed   By: Vinnie Langton M.D.   On: 05/16/2015 10:07   I have personally reviewed and evaluated these images and lab results as part of my medical decision-making.  MDM   Final diagnoses:  Acute bronchitis, unspecified organism    Eval. C/w Bronchitis and Bronchospasm. Doubt PNE or sepsis  Nursing Notes Reviewed/ Care Coordinated Applicable Imaging Reviewed Interpretation of Laboratory Data incorporated into ED treatment  Left AMA  I personally performed the services described in this documentation, which was scribed in my presence. The recorded information has been reviewed and is accurate.        Daleen Bo, MD 05/17/15 2044

## 2015-05-16 NOTE — ED Notes (Signed)
Pt out pacing the halls.  Ride here to pick her up.  States she has to go.  Left without d/c instructions or prescriptions.

## 2015-05-16 NOTE — ED Notes (Signed)
Pt contiuously coughing, too the point of being incontinent of urine- Up too the bathroom and bedline changed- Dr Eulis Foster informed

## 2015-06-08 ENCOUNTER — Ambulatory Visit (HOSPITAL_COMMUNITY)
Admission: RE | Admit: 2015-06-08 | Discharge: 2015-06-08 | Disposition: A | Discharge status: Home / Self Care | Payer: Medicare Other | Source: Ambulatory Visit | Attending: Obstetrics and Gynecology | Admitting: Obstetrics and Gynecology

## 2015-06-08 DIAGNOSIS — R921 Mammographic calcification found on diagnostic imaging of breast: Secondary | ICD-10-CM | POA: Insufficient documentation

## 2015-06-08 DIAGNOSIS — Z09 Encounter for follow-up examination after completed treatment for conditions other than malignant neoplasm: Secondary | ICD-10-CM

## 2015-07-24 ENCOUNTER — Other Ambulatory Visit: Payer: Self-pay | Admitting: Gastroenterology

## 2015-10-20 IMAGING — DX DG CHEST 2V
2 series · 2 of 2 positions shown · non-contrast
Comparison: 05/13/2014

CLINICAL DATA: Generalized chest pain, dyspnea and lower extremity
swelling. Onset this morning.

EXAM:
CHEST  2 VIEW

[chest lat]
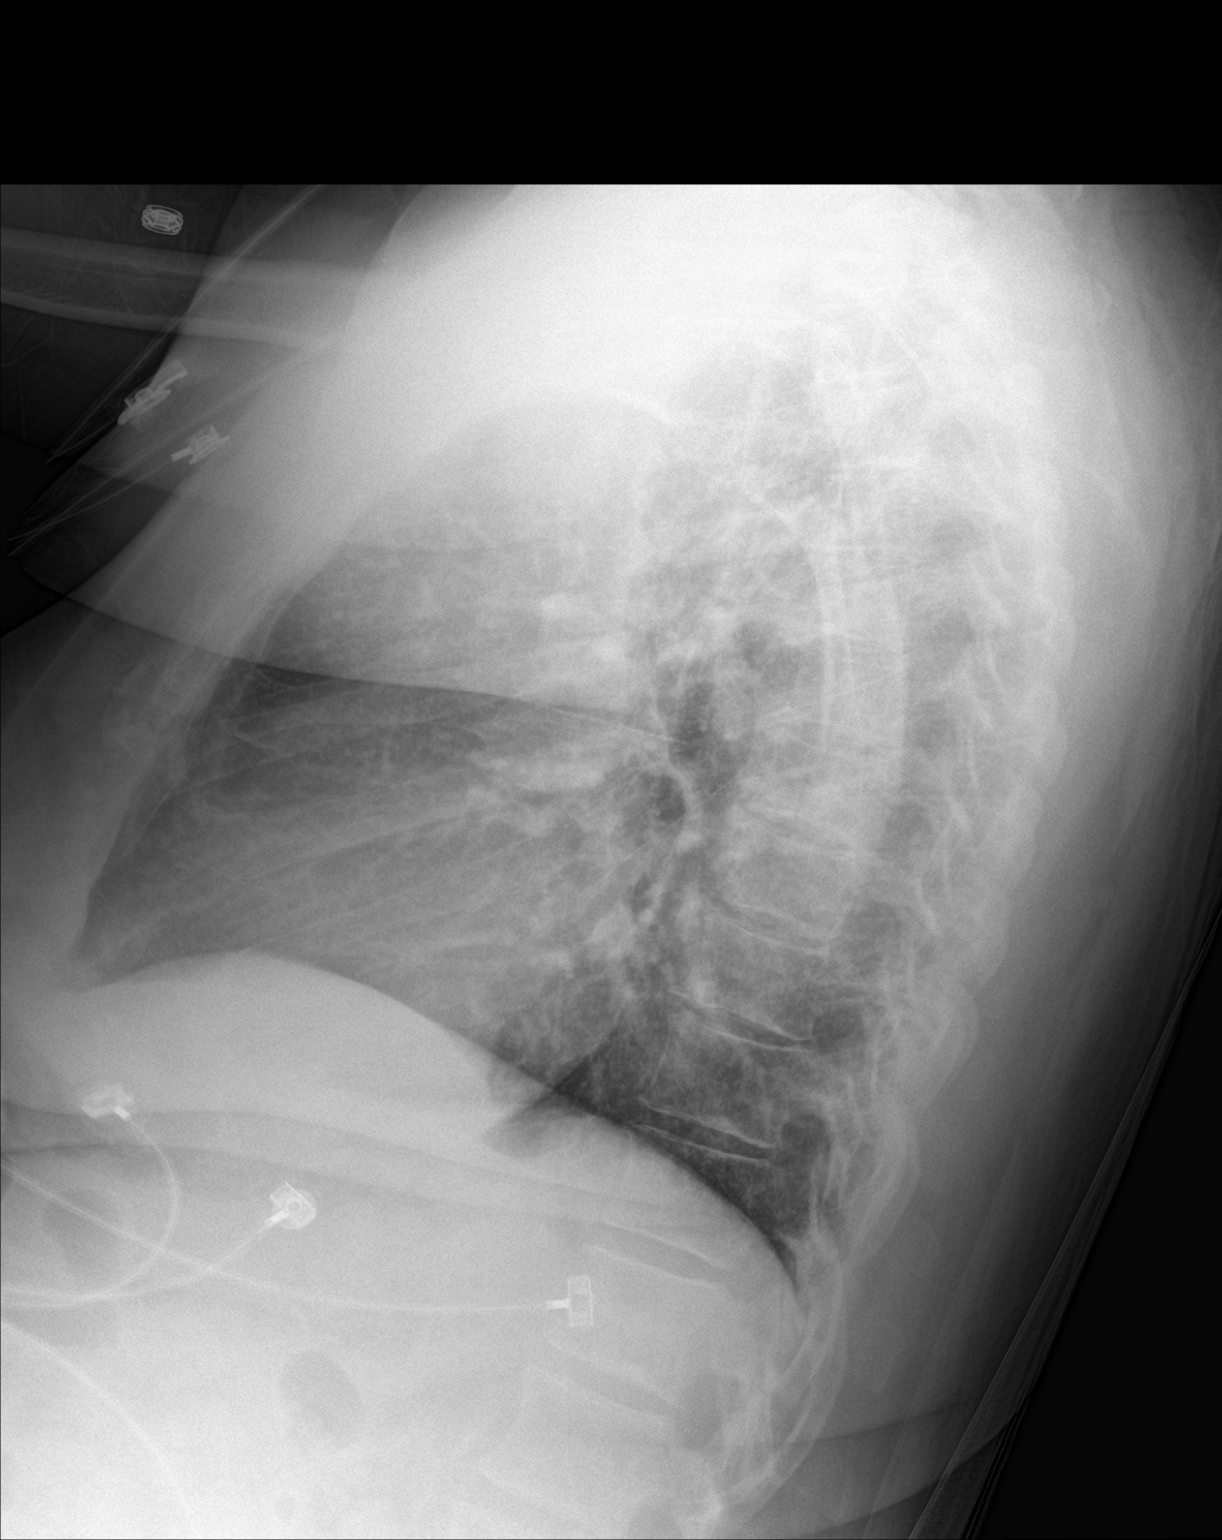

[chest ap strecther]
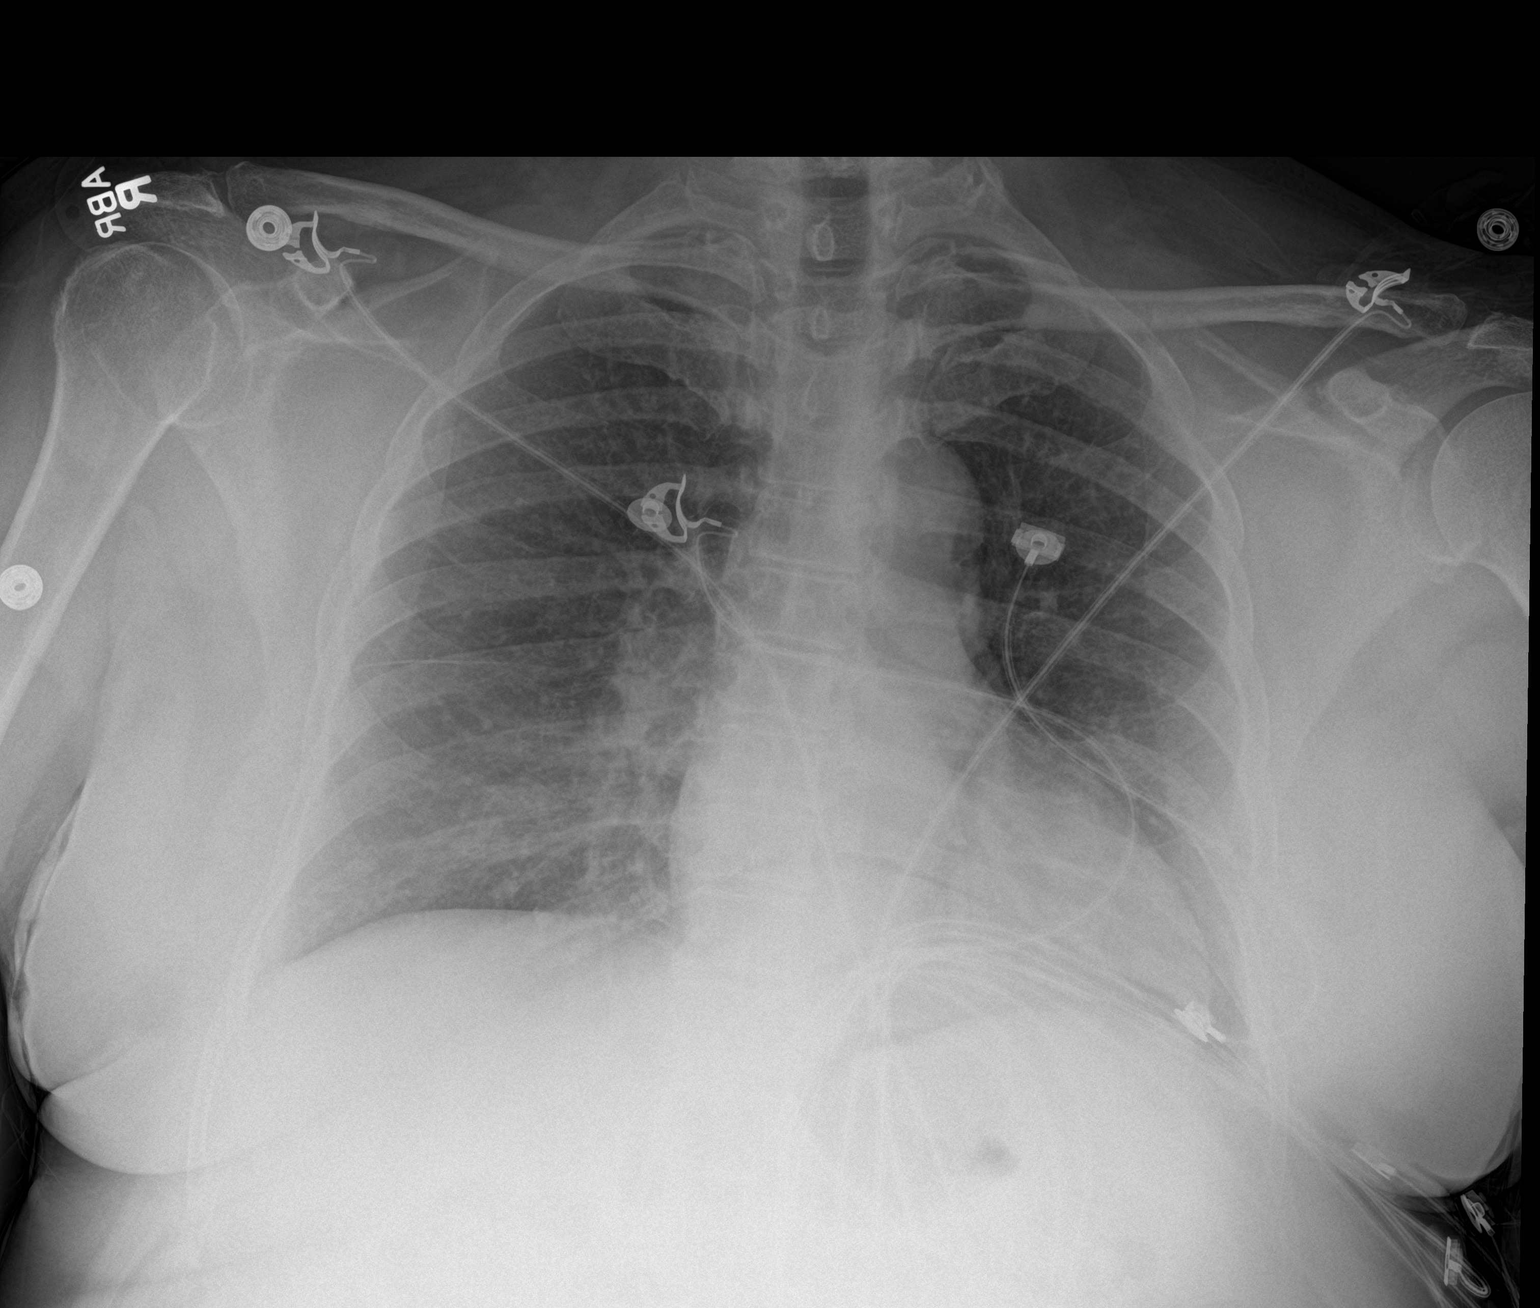

[2 of 2 positions shown; findings below may reference images not displayed]

FINDINGS: The heart size and mediastinal contours are within normal limits.
Both lungs are clear. The visualized skeletal structures are
unremarkable.
IMPRESSION: No active cardiopulmonary disease.

## 2015-11-26 ENCOUNTER — Encounter (HOSPITAL_COMMUNITY): Payer: Self-pay | Admitting: *Deleted

## 2015-11-26 ENCOUNTER — Emergency Department (HOSPITAL_COMMUNITY)
Admission: EM | Admit: 2015-11-26 | Discharge: 2015-11-26 | Disposition: A | Discharge status: Home / Self Care | Payer: Medicare Other | Attending: Emergency Medicine | Admitting: Emergency Medicine

## 2015-11-26 DIAGNOSIS — Z6832 Body mass index (BMI) 32.0-32.9, adult: Secondary | ICD-10-CM | POA: Insufficient documentation

## 2015-11-26 DIAGNOSIS — M549 Dorsalgia, unspecified: Secondary | ICD-10-CM | POA: Insufficient documentation

## 2015-11-26 DIAGNOSIS — Y929 Unspecified place or not applicable: Secondary | ICD-10-CM | POA: Insufficient documentation

## 2015-11-26 DIAGNOSIS — I10 Essential (primary) hypertension: Secondary | ICD-10-CM | POA: Insufficient documentation

## 2015-11-26 DIAGNOSIS — Z85038 Personal history of other malignant neoplasm of large intestine: Secondary | ICD-10-CM | POA: Insufficient documentation

## 2015-11-26 DIAGNOSIS — F17210 Nicotine dependence, cigarettes, uncomplicated: Secondary | ICD-10-CM | POA: Diagnosis not present

## 2015-11-26 DIAGNOSIS — S61011A Laceration without foreign body of right thumb without damage to nail, initial encounter: Secondary | ICD-10-CM | POA: Diagnosis not present

## 2015-11-26 DIAGNOSIS — E669 Obesity, unspecified: Secondary | ICD-10-CM | POA: Insufficient documentation

## 2015-11-26 DIAGNOSIS — Y939 Activity, unspecified: Secondary | ICD-10-CM | POA: Diagnosis not present

## 2015-11-26 DIAGNOSIS — S61219A Laceration without foreign body of unspecified finger without damage to nail, initial encounter: Secondary | ICD-10-CM

## 2015-11-26 DIAGNOSIS — W260XXA Contact with knife, initial encounter: Secondary | ICD-10-CM | POA: Diagnosis not present

## 2015-11-26 DIAGNOSIS — F319 Bipolar disorder, unspecified: Secondary | ICD-10-CM | POA: Insufficient documentation

## 2015-11-26 DIAGNOSIS — Y999 Unspecified external cause status: Secondary | ICD-10-CM | POA: Diagnosis not present

## 2015-11-26 MED ORDER — TETANUS-DIPHTH-ACELL PERTUSSIS 5-2.5-18.5 LF-MCG/0.5 IM SUSP
0.5000 mL | Freq: Once | INTRAMUSCULAR | Status: AC
  Administered 2015-11-26: 0.5 mL via INTRAMUSCULAR
  Filled 2015-11-26: qty 0.5

## 2015-11-26 NOTE — ED Provider Notes (Signed)
CSN: [id number]     Arrival date & time 11/26/15  2009 History   First MD Initiated Contact with Patient 11/26/15 2103     Chief Complaint  Patient presents with  . finger laceration      (Consider location/radiation/quality/duration/timing/severity/associated sxs/prior Treatment) HPI Comments: Patient is a 60 year old female who presents to the emergency department with a laceration to the thumb.  The patient states that she was preparing dinner tonight when she cut her right thumb with a knife. She states that in spite of applying pressure at scene as though he continued to do a lot of bleeding. She became concerned, and came to the emergency room for evaluation. She denies being on any anticoagulation medications. There is no history of any bleeding disorders. There's been no previous operations or procedures involving the right thumb.  The history is provided by the patient.    Past Medical History  Diagnosis Date  . Back pain   . Obesity     History of  . Nicotine addiction   . Psychosis   . Hypertension   . OD (overdose of drug)     hospitalized for 11 days   . Bipolar 1 disorder ([place])   . Hypercholesteremia   . Migraine headache   . Lateral epicondylitis  of elbow     right  . Cancer [facility]) 2014    Colon Cancer   Past Surgical History  Procedure Laterality Date  . Partial hysterectomy    . Breast biopsy  10/18/2011    Procedure: BREAST BIOPSY;  Surgeon: Jamesetta So, MD;  Location: [facility];  Service: General;  Laterality: Left;  . Esophagogastroduodenoscopy N/A 12/26/2012    [id number] reflux esophagitis. Gastric and duodenal bulbar erosions-status post gastric biopsynegative H.pylori  . Abdominal hysterectomy    . Colonoscopy    . Colonoscopy N/A 05/29/2013    [id number] mass most likely representing colorectal cancer S/ P biospy.Multiple colonic and rectal polyps removed/treated as described above. Colonic diverticulosis  . Colon resection N/A 06/23/2013   Procedure: HAND ASSISTED LAPAROSCOPIC PARTIAL COLECTOMY;  Surgeon: Jamesetta So, MD;  Location: [facility];  Service: General;  Laterality: N/A;  . Esophagogastroduodenoscopy (egd) with propofol N/A 08/13/2014    RMR: Small benign cystic-appearing lesion in distal esophagus of doubtful clinical significance, otherwise normal esophagus, status post Maloney dilation. Small hiatal hernia, some retained gastric contents (query delayed gastric emptying.)  . Esophageal dilation N/A 08/13/2014    Procedure: Salix;  Surgeon: Daneil Dolin, MD;  Location: [facility];  Service: Endoscopy;  Laterality: N/A;  . Colonoscopy with propofol N/A 08/13/2014    RMR: Status post sigmoid colectomy. Multiple colonic polyps removed as described above. Redundant colon. Pan colonic diverticuloisi  . Polypectomy N/A 08/13/2014    Procedure: POLYPECTOMY;  Surgeon: Daneil Dolin, MD;  Location: [facility];  Service: Endoscopy;  Laterality: N/A;  . Biopsy N/A 08/13/2014    Procedure: BIOPSY;  Surgeon: Daneil Dolin, MD;  Location: [facility];  Service: Endoscopy;  Laterality: N/A;   Family History  Problem Relation Age of Onset  . Anesthesia problems Neg Hx   . Hypotension Neg Hx   . Malignant hyperthermia Neg Hx   . Pseudochol deficiency Neg Hx   . Colon cancer Father     diagnosed in his 66s   Social History  Substance Use Topics  . Smoking status: Current Every Day Smoker -- 0.50 packs/day for 38 years    Types: Cigarettes  Last Attempt to Quit: 06/19/2014  . Smokeless tobacco: Never Used     Comment: vapor only  . Alcohol Use: No   OB History    Gravida Para Term Preterm AB TAB SAB Ectopic Multiple Living   4 3 3  1 1    3      Review of Systems  Musculoskeletal: Positive for back pain.  Skin: Positive for rash.  All other systems reviewed and are negative.     Allergies  Review of patient's allergies indicates no known allergies.  Home Medications   Prior to Admission  medications   Medication Sig Start Date End Date Taking? Authorizing Provider  amLODipine-benazepril (LOTREL) 10-20 MG per capsule Take 1 capsule by mouth daily.  12/04/14   Historical Provider, MD  Chlorphen-Pseudoephed-APAP (THERAFLU FLU/COLD PO) Take 30 mLs by mouth daily as needed (cold).    Historical Provider, MD  Cholecalciferol (VITAMIN D3) 2000 UNITS TABS Take 1 tablet by mouth daily.    Historical Provider, MD  clonazePAM (KLONOPIN) 1 MG tablet Take 1 mg by mouth 3 (three) times daily.  12/18/14   Historical Provider, MD  cyanocobalamin 500 MCG tablet Take 500 mcg by mouth daily.    Historical Provider, MD  fluticasone (FLONASE) 50 MCG/ACT nasal spray Place 1-2 sprays into both nostrils daily. 12/16/14   Historical Provider, MD  hydrochlorothiazide (HYDRODIURIL) 50 MG tablet Take 50 mg by mouth daily.    Historical Provider, MD  HYDROcodone-acetaminophen (NORCO) 5-325 MG per tablet Take 1-2 tablets by mouth every 6 (six) hours as needed. Patient not taking: Reported on 05/16/2015 01/15/15   Veryl Speak, MD  LATUDA 40 MG TABS tablet Take 40 mg by mouth at bedtime.  03/22/15   Historical Provider, MD  lovastatin (MEVACOR) 20 MG tablet Take 20 mg by mouth at bedtime.    Historical Provider, MD  meloxicam (MOBIC) 7.5 MG tablet Take 7.5 mg by mouth daily.    Historical Provider, MD  methocarbamol (ROBAXIN) 500 MG tablet Take 1 tablet (500 mg total) by mouth 3 (three) times daily. Patient not taking: Reported on 05/16/2015 11/04/14   Tammy Triplett, PA-C  omeprazole (PRILOSEC) 20 MG capsule TAKE ONE CAPSULE BY MOUTH 30 MINUTES PRIOR TO FIRST MEAL OF THE DAY. 07/27/15   Orvil Feil, NP  Pseudoeph-Doxylamine-DM-APAP (NYQUIL PO) Take 30 mLs by mouth daily as needed (cold).    Historical Provider, MD  sertraline (ZOLOFT) 100 MG tablet Take 100 mg by mouth daily. 12/18/14   Historical Provider, MD  solifenacin (VESICARE) 5 MG tablet Take 5 mg by mouth daily.    Historical Provider, MD  zolpidem (AMBIEN) 10  MG tablet Take 10 mg by mouth every other day.    Historical Provider, MD   BP 145/86 mmHg  Pulse 82  Temp(Src) 98.3 F (36.8 C) (Oral)  Resp 18  Ht 5\' 4"  (1.626 m)  Wt 87.091 kg  BMI 32.94 kg/m2  SpO2 100% Physical Exam  Constitutional: She is oriented to person, place, and time. She appears well-developed and well-nourished.  Non-toxic appearance.  HENT:  Head: Normocephalic.  Right Ear: Tympanic membrane and external ear normal.  Left Ear: Tympanic membrane and external ear normal.  Eyes: EOM and lids are normal. Pupils are equal, round, and reactive to light.  Neck: Normal range of motion. Neck supple. Carotid bruit is not present.  Cardiovascular: Normal rate, regular rhythm, normal heart sounds, intact distal pulses and normal pulses.   Pulmonary/Chest: Breath sounds normal. No respiratory distress.  Abdominal: Soft. Bowel sounds are normal. There is no tenderness. There is no guarding.  Musculoskeletal: Normal range of motion.       Right hand: She exhibits tenderness and laceration. She exhibits normal range of motion and normal capillary refill. Normal sensation noted. Normal strength noted.       Hands: Lymphadenopathy:       Head (right side): No submandibular adenopathy present.       Head (left side): No submandibular adenopathy present.    She has no cervical adenopathy.  Neurological: She is alert and oriented to person, place, and time. She has normal strength. No cranial nerve deficit or sensory deficit.  Skin: Skin is warm and dry.  There is a poison ivy rash on both arms.  Psychiatric: She has a normal mood and affect. Her speech is normal.  Nursing note and vitals reviewed.   ED Course  .Marland KitchenLaceration Repair Date/Time: 11/26/2015 9:22 PM Performed by: Lily Kocher Authorized by: Lily Kocher Consent: Verbal consent obtained. Risks and benefits: risks, benefits and alternatives were discussed Consent given by: patient Patient understanding: patient  states understanding of the procedure being performed Patient identity confirmed: arm band Time out: Immediately prior to procedure a "time out" was called to verify the correct patient, procedure, equipment, support staff and site/side marked as required. Body area: upper extremity Location details: right thumb Laceration length: 2.3 cm Foreign bodies: no foreign bodies Vascular damage: no Patient sedated: no Preparation: Patient was prepped and draped in the usual sterile fashion. Irrigation solution: saline Amount of cleaning: standard Debridement: none Skin closure: glue Approximation: close Approximation difficulty: simple Patient tolerance: Patient tolerated the procedure well with no immediate complications   (including critical care time) Labs Review Labs Reviewed - No data to display  Imaging Review No results found. I have personally reviewed and evaluated these images and lab results as part of my medical decision-making.   EKG Interpretation None      MDM  Laceration repaired with Dermabond. Discussed with the patient the need to return if signs of infection or other problems. Patient knowledge is understanding of these discharge instructions and is in agreement. She will return if any changes or problems.    Final diagnoses:  None    *I have reviewed nursing notes, vital signs, and all appropriate lab and imaging results for this patient.[address] PA-C 11/26/15 2213  Nat Christen, MD 11/27/15 [phone]

## 2015-11-26 NOTE — ED Notes (Signed)
Pt states she cut her right thumb with a knife while preparing supper. Pt has a small laceration to right thumb.

## 2015-11-26 NOTE — Discharge Instructions (Signed)
Your laceration was repaired with Dermabond. This will come off on its own in about 7-10 days. Please do not pull on it. Please keep the wound clean and dry tonight. You may get it gently wet tomorrow, but not submerged in water. Please see your primary physician, or return to the emergency department if any red streaks going up her hand, pus underneath the Dermabond closure, unusual swelling, or signs of infection. Stitches, Staples, or Adhesive Wound Closure Health care providers use stitches (sutures), staples, and certain glue (skin adhesives) to hold skin together while it heals (wound closure). You may need this treatment after you have surgery or if you cut your skin accidentally. These methods help your skin to heal more quickly and make it less likely that you will have a scar. A wound may take several months to heal completely. The type of wound you have determines when your wound gets closed. In most cases, the wound is closed as soon as possible (primary skin closure). Sometimes, closure is delayed so the wound can be cleaned and allowed to heal naturally. This reduces the chance of infection. Delayed closure may be needed if your wound:  Is caused by a bite.  Happened more than 6 hours ago.  Involves loss of skin or the tissues under the skin.  Has dirt or debris in it that cannot be removed.  Is infected. WHAT ARE THE DIFFERENT KINDS OF WOUND CLOSURES? There are many options for wound closure. The one that your health care provider uses depends on how deep and how large your wound is. Adhesive Glue To use this type of glue to close a wound, your health care provider holds the edges of the wound together and paints the glue on the surface of your skin. You may need more than one layer of glue. Then the wound may be covered with a light bandage (dressing). This type of skin closure may be used for small wounds that are not deep (superficial). Using glue for wound closure is less painful  than other methods. It does not require a medicine that numbs the area (local anesthetic). This method also leaves nothing to be removed. Adhesive glue is often used for children and on facial wounds. Adhesive glue cannot be used for wounds that are deep, uneven, or bleeding. It is not used inside of a wound.  Adhesive Strips These strips are made of sticky (adhesive), porous paper. They are applied across your skin edges like a regular adhesive bandage. You leave them on until they fall off. Adhesive strips may be used to close very superficial wounds. They may also be used along with sutures to improve the closure of your skin edges.  Sutures Sutures are the oldest method of wound closure. Sutures can be made from natural substances, such as silk, or from synthetic materials, such as nylon and steel. They can be made from a material that your body can break down as your wound heals (absorbable), or they can be made from a material that needs to be removed from your skin (nonabsorbable). They come in many different strengths and sizes. Your health care provider attaches the sutures to a steel needle on one end. Sutures can be passed through your skin, or through the tissues beneath your skin. Then they are tied and cut. Your skin edges may be closed in one continuous stitch or in separate stitches. Sutures are strong and can be used for all kinds of wounds. Absorbable sutures may be used to  close tissues under the skin. The disadvantage of sutures is that they may cause skin reactions that lead to infection. Nonabsorbable sutures need to be removed. Staples When surgical staples are used to close a wound, the edges of your skin on both sides of the wound are brought close together. A staple is placed across the wound, and an instrument secures the edges together. Staples are often used to close surgical cuts (incisions). Staples are faster to use than sutures, and they cause less skin reaction. Staples  need to be removed using a tool that bends the staples away from your skin. HOW DO I CARE FOR MY WOUND CLOSURE?  Take medicines only as directed by your health care provider.  If you were prescribed an antibiotic medicine for your wound, finish it all even if you start to feel better.  Use ointments or creams only as directed by your health care provider.  Wash your hands with soap and water before and after touching your wound.  Do not soak your wound in water. Do not take baths, swim, or use a hot tub until your health care provider approves.  Ask your health care provider when you can start showering. Cover your wound if directed by your health care provider.  Do not take out your own sutures or staples.  Do not pick at your wound. Picking can cause an infection.  Keep all follow-up visits as directed by your health care provider. This is important. HOW LONG WILL I HAVE MY WOUND CLOSURE?  Leave adhesive glue on your skin until the glue peels away.  Leave adhesive strips on your skin until the strips fall off.  Absorbable sutures will dissolve within several days.  Nonabsorbable sutures and staples must be removed. The location of the wound will determine how long they stay in. This can range from several days to a couple of weeks. WHEN SHOULD I SEEK HELP FOR MY WOUND CLOSURE? Contact your health care provider if:  You have a fever.  You have chills.  You have drainage, redness, swelling, or pain at your wound.  There is a bad smell coming from your wound.  The skin edges of your wound start to separate after your sutures have been removed.  Your wound becomes thick, raised, and darker in color after your sutures come out (scarring).   This information is not intended to replace advice given to you by your health care provider. Make sure you discuss any questions you have with your health care provider.   Document Released: 02/28/2001 Document Revised: 06/26/2014  Document Reviewed: 11/12/2013 Elsevier Interactive Patient Education [employer].

## 2015-12-13 ENCOUNTER — Other Ambulatory Visit: Payer: Self-pay | Admitting: Family Medicine

## 2015-12-17 ENCOUNTER — Other Ambulatory Visit: Payer: Self-pay | Admitting: Family Medicine

## 2016-01-12 ENCOUNTER — Other Ambulatory Visit: Payer: Self-pay | Admitting: Family Medicine

## 2016-01-12 NOTE — Telephone Encounter (Signed)
Rx Request 

## 2016-02-14 ENCOUNTER — Other Ambulatory Visit: Payer: Self-pay | Admitting: Family Medicine

## 2016-02-18 IMAGING — DX DG CHEST 2V
2 series · 2 of 2 positions shown · non-contrast
Comparison: Chest x-ray 04/08/2015.

CLINICAL DATA: 59-year-old female with history of cough for the past 3 days. Chest
congestion.

EXAM:
CHEST  2 VIEW

[chest pa]
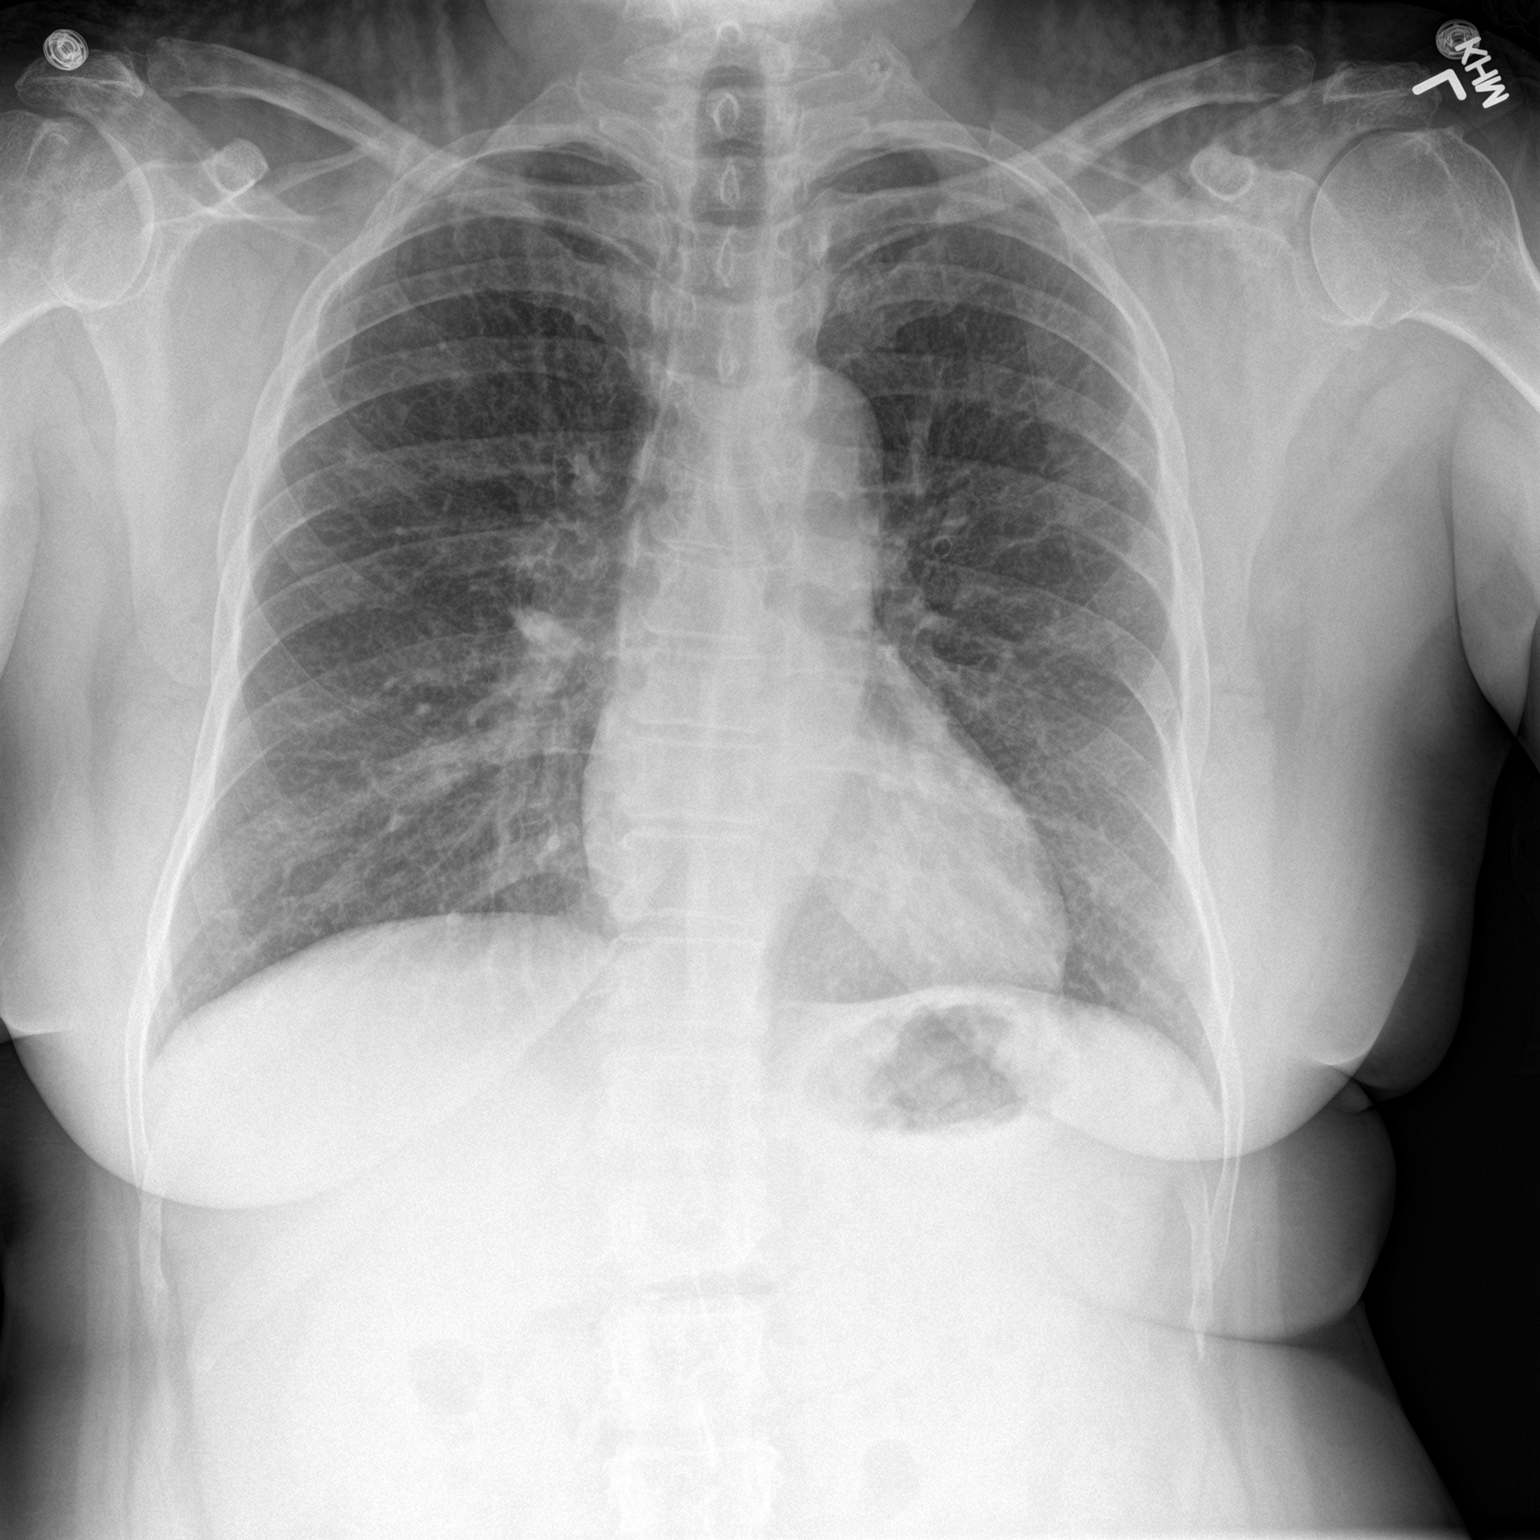

[chest lat]
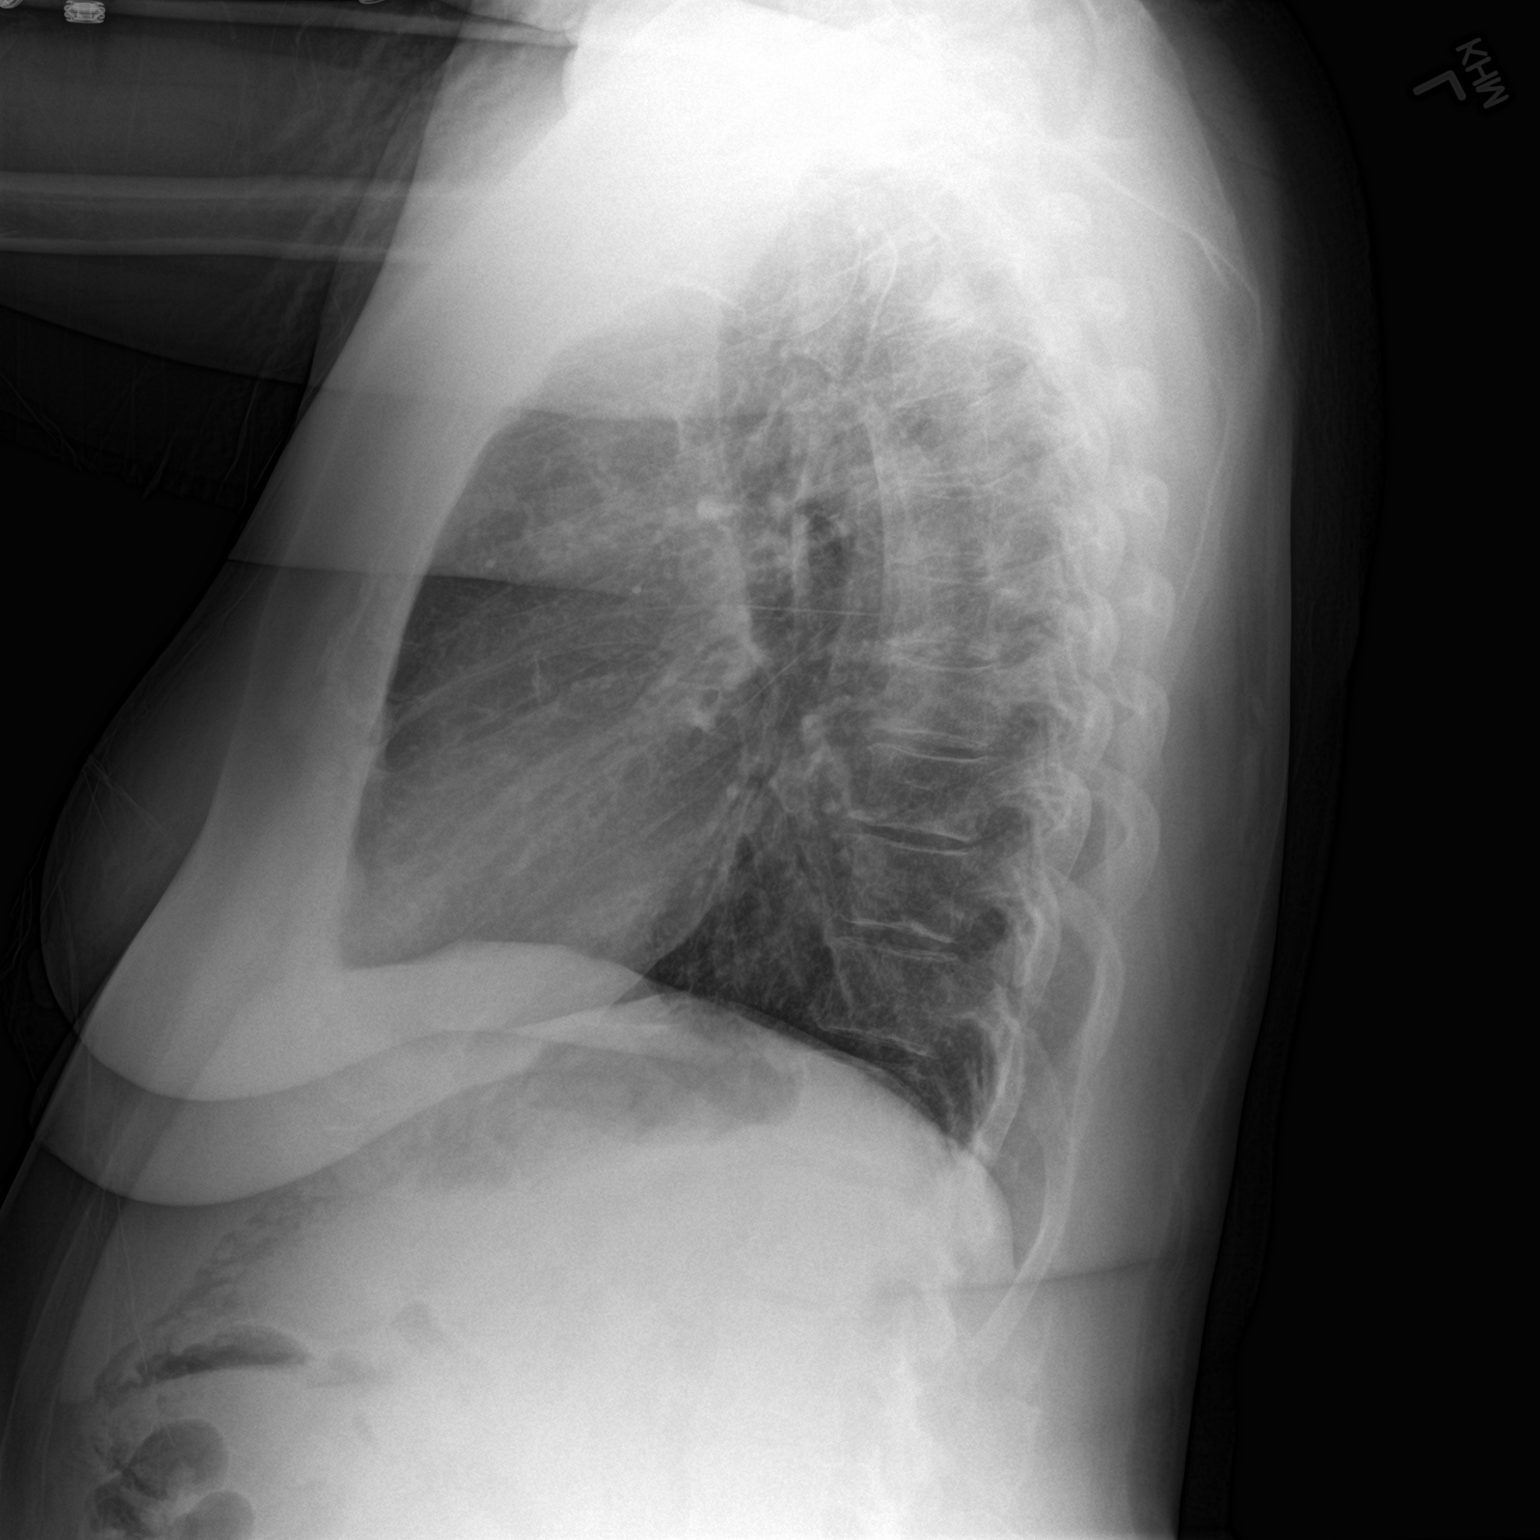

[2 of 2 positions shown; findings below may reference images not displayed]

FINDINGS: Mild diffuse peribronchial cuffing. Lung volumes are normal. No
consolidative airspace disease. No pleural effusions. No
pneumothorax. No pulmonary nodule or mass noted. Pulmonary
vasculature and the cardiomediastinal silhouette are within normal
limits.
IMPRESSION: 1. Mild diffuse peribronchial cuffing, suggestive of acute
bronchitis.

## 2016-02-29 ENCOUNTER — Other Ambulatory Visit (HOSPITAL_COMMUNITY)
Admission: RE | Admit: 2016-02-29 | Discharge: 2016-02-29 | Disposition: A | Discharge status: Home / Self Care | Payer: Medicare Other | Source: Ambulatory Visit | Attending: Internal Medicine | Admitting: Internal Medicine

## 2016-02-29 DIAGNOSIS — F419 Anxiety disorder, unspecified: Secondary | ICD-10-CM | POA: Diagnosis present

## 2016-02-29 DIAGNOSIS — F319 Bipolar disorder, unspecified: Secondary | ICD-10-CM | POA: Diagnosis present

## 2016-02-29 DIAGNOSIS — I10 Essential (primary) hypertension: Secondary | ICD-10-CM | POA: Insufficient documentation

## 2016-02-29 LAB — COMPREHENSIVE METABOLIC PANEL
ALK PHOS: 73 U/L (ref 38–126)
ALT: 17 U/L (ref 14–54)
AST: 16 U/L (ref 15–41)
Albumin: 4.6 g/dL (ref 3.5–5.0)
Anion gap: 9 (ref 5–15)
BILIRUBIN TOTAL: 0.6 mg/dL (ref 0.3–1.2)
BUN: 17 mg/dL (ref 6–20)
CALCIUM: 9.9 mg/dL (ref 8.9–10.3)
CO2: 29 mmol/L (ref 22–32)
CREATININE: 1 mg/dL (ref 0.44–1.00)
Chloride: 103 mmol/L (ref 101–111)
Glucose, Bld: 91 mg/dL (ref 65–99)
Potassium: 3.1 mmol/L — ABNORMAL LOW (ref 3.5–5.1)
SODIUM: 141 mmol/L (ref 135–145)
TOTAL PROTEIN: 7.9 g/dL (ref 6.5–8.1)

## 2016-02-29 LAB — TSH: TSH: 1.655 u[IU]/mL (ref 0.350–4.500)

## 2016-02-29 LAB — LIPID PANEL
Cholesterol: 200 mg/dL (ref 0–200)
HDL: 52 mg/dL (ref >40)
LDL CALC: 122 mg/dL — AB (ref 0–99)
TRIGLYCERIDES: 131 mg/dL (ref <150)
Total CHOL/HDL Ratio: 3.8 RATIO
VLDL: 26 mg/dL (ref 0–40)

## 2016-03-01 LAB — HEMOGLOBIN A1C
HEMOGLOBIN A1C: 5.6 % (ref 4.8–5.6)
Mean Plasma Glucose: 114 mg/dL

## 2016-03-10 ENCOUNTER — Other Ambulatory Visit: Payer: Self-pay | Admitting: Gastroenterology

## 2016-03-29 ENCOUNTER — Encounter (HOSPITAL_COMMUNITY): Payer: Self-pay | Admitting: Emergency Medicine

## 2016-03-29 ENCOUNTER — Emergency Department (HOSPITAL_COMMUNITY): Payer: Medicare Other

## 2016-03-29 ENCOUNTER — Emergency Department (HOSPITAL_COMMUNITY)
Admission: EM | Admit: 2016-03-29 | Discharge: 2016-03-30 | Disposition: A | Discharge status: Home / Self Care | Payer: Medicare Other | Attending: Emergency Medicine | Admitting: Emergency Medicine

## 2016-03-29 DIAGNOSIS — K5900 Constipation, unspecified: Secondary | ICD-10-CM | POA: Insufficient documentation

## 2016-03-29 DIAGNOSIS — Z79899 Other long term (current) drug therapy: Secondary | ICD-10-CM | POA: Diagnosis not present

## 2016-03-29 DIAGNOSIS — Z85038 Personal history of other malignant neoplasm of large intestine: Secondary | ICD-10-CM | POA: Diagnosis not present

## 2016-03-29 DIAGNOSIS — I10 Essential (primary) hypertension: Secondary | ICD-10-CM | POA: Diagnosis not present

## 2016-03-29 DIAGNOSIS — F17210 Nicotine dependence, cigarettes, uncomplicated: Secondary | ICD-10-CM | POA: Insufficient documentation

## 2016-03-29 LAB — BASIC METABOLIC PANEL
ANION GAP: 5 (ref 5–15)
BUN: 22 mg/dL — ABNORMAL HIGH (ref 6–20)
CHLORIDE: 114 mmol/L — AB (ref 101–111)
CO2: 25 mmol/L (ref 22–32)
Calcium: 9.5 mg/dL (ref 8.9–10.3)
Creatinine, Ser: 1.06 mg/dL — ABNORMAL HIGH (ref 0.44–1.00)
GFR calc Af Amer: >60 mL/min (ref >60)
GFR, EST NON AFRICAN AMERICAN: 57 mL/min — AB (ref >60)
GLUCOSE: 105 mg/dL — AB (ref 65–99)
POTASSIUM: 3.6 mmol/L (ref 3.5–5.1)
Sodium: 144 mmol/L (ref 135–145)

## 2016-03-29 LAB — CBC
HCT: 39.1 % (ref 36.0–46.0)
HEMOGLOBIN: 13.7 g/dL (ref 12.0–15.0)
MCH: 30.7 pg (ref 26.0–34.0)
MCHC: 35 g/dL (ref 30.0–36.0)
MCV: 87.7 fL (ref 78.0–100.0)
Platelets: 182 10*3/uL (ref 150–400)
RBC: 4.46 MIL/uL (ref 3.87–5.11)
RDW: 14.1 % (ref 11.5–15.5)
WBC: 5.7 10*3/uL (ref 4.0–10.5)

## 2016-03-29 MED ORDER — IOPAMIDOL (ISOVUE-300) INJECTION 61%
INTRAVENOUS | Status: AC
  Filled 2016-03-29: qty 30

## 2016-03-29 NOTE — ED Triage Notes (Signed)
Pt states she has not had a normal bowel movement in 2 weeks. Pt states she has been taking magnesium citrate with no results.

## 2016-03-29 NOTE — ED Provider Notes (Signed)
[facility] DEPT Provider Note   CSN: [id number] Arrival date & time: 03/29/16  2141  By signing my name below, I, Ilithyia Willmon, attest that this documentation has been prepared under the direction and in the presence of physician practitioner, Varney Biles, MD. Electronically Signed: Dora Sims, Scribe. 03/29/2016. 11:07 PM.  History   Chief Complaint Chief Complaint  Patient presents with  . Constipation    The history is provided by the patient. No language interpreter was used.     HPI Comments: Ashley Patrick is a 60 y.o. female who presents to the Emergency Department complaining of constant constipation for the last two weeks. She states she has not had a normal bowel movement since onset; she states she has defecated "small drops."  She states she has tried magnesium citrate with no relief of her constipation. She notes she has experienced constipation in the past and magnesium citrate usually provides good relief. She states she has been passing gas and has had a normal appetite. Pt also reports cramping lower abdominal pain and abdominal distension beginning two days ago. She endorses pain exacerbation with palpation to her lower abdomen. Pt notes PSHx of polypectomy in 2015. She does not use pain medication regularly. She notes she tried to see her PCP for constipation today but their office was too busy. Pt denies nausea, vomiting, or any other associated symptoms.  Past Medical History:  Diagnosis Date  . Back pain   . Bipolar 1 disorder ([place])   . Cancer [facility]) 2014   Colon Cancer  . Hypercholesteremia   . Hypertension   . Lateral epicondylitis  of elbow    right  . Migraine headache   . Nicotine addiction   . Obesity    History of  . OD (overdose of drug)    hospitalized for 11 days   . Psychosis     Patient Active Problem List   Diagnosis Date Noted  . Hiatal hernia   . History of colonic polyps   . Diverticulosis of colon without hemorrhage   .  Constipation 07/22/2014  . History of colon cancer 07/20/2014  . Dysphagia 07/20/2014  . Lower abdominal pain 09/30/2013  . Bipolar affective disorder, current episode depressed ([place]) 08/08/2013  . Anxiety, generalized 08/08/2013  . 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 Noncompliance with medications 08/07/2013  . Colon cancer ([place]) 06/23/2013  . Rectal bleeding 05/09/2013  . Gastritis 05/09/2013  . LLQ pain 11/28/2012  . Hemorrhagic cyst of ovary 11/28/2012  . SHOULDER PAIN, LEFT 05/18/2010  . ACUTE BRONCHITIS 09/03/2008  . UNSPECIFIED URINARY INCONTINENCE 09/03/2008  . ACUTE CYSTITIS 07/19/2008  . Abdominal pain, generalized 07/09/2008  . OBESITY 11/22/2007  . UNSPECIFIED PSYCHOSIS 11/22/2007  . MIGRAINE HEADACHE 11/22/2007  . HYPERTENSION 11/22/2007  . BACK PAIN 11/22/2007  . LATERAL EPICONDYLITIS OF ELBOW 11/22/2007    Past Surgical History:  Procedure Laterality Date  . ABDOMINAL HYSTERECTOMY    . BIOPSY N/A 08/13/2014   Procedure: BIOPSY;  Surgeon: Daneil Dolin, MD;  Location: [facility];  Service: Endoscopy;  Laterality: N/A;  . BREAST BIOPSY  10/18/2011   Procedure: BREAST BIOPSY;  Surgeon: Jamesetta So, MD;  Location: [facility];  Service: General;  Laterality: Left;  . COLON RESECTION N/A 06/23/2013   Procedure: HAND ASSISTED LAPAROSCOPIC PARTIAL COLECTOMY;  Surgeon: Jamesetta So, MD;  Location: [facility];  Service: General;  Laterality: N/A;  . COLONOSCOPY    . COLONOSCOPY N/A 05/29/2013  [id number] mass most likely representing colorectal cancer S/ P biospy.Multiple colonic and rectal polyps removed/treated as described above. Colonic diverticulosis  . COLONOSCOPY WITH PROPOFOL N/A 08/13/2014   RMR: Status post sigmoid colectomy. Multiple colonic polyps removed as described above. Redundant colon. Pan colonic diverticuloisi  . ESOPHAGEAL DILATION N/A 08/13/2014   Procedure: [place];  Surgeon: Daneil Dolin, MD;   Location: [facility];  Service: Endoscopy;  Laterality: N/A;  . ESOPHAGOGASTRODUODENOSCOPY N/A 12/26/2012   [id number] reflux esophagitis. Gastric and duodenal bulbar erosions-status post gastric biopsynegative H.pylori  . ESOPHAGOGASTRODUODENOSCOPY (EGD) WITH PROPOFOL N/A 08/13/2014   RMR: Small benign cystic-appearing lesion in distal esophagus of doubtful clinical significance, otherwise normal esophagus, status post Maloney dilation. Small hiatal hernia, some retained gastric contents (query delayed gastric emptying.)  . partial hysterectomy    . POLYPECTOMY N/A 08/13/2014   Procedure: POLYPECTOMY;  Surgeon: Daneil Dolin, MD;  Location: [facility];  Service: Endoscopy;  Laterality: N/A;    OB History    Gravida Para Term Preterm AB Living   4 3 3   1 3    SAB TAB Ectopic Multiple Live Births     1             Home Medications    Prior to Admission medications   Medication Sig Start Date End Date Taking? Authorizing Provider  amLODipine-benazepril (LOTREL) 10-20 MG per capsule Take 1 capsule by mouth daily.  12/04/14  Yes Historical Provider, MD  ARIPiprazole (ABILIFY) 15 MG tablet Take 15 mg by mouth every evening.  02/25/16  Yes Historical Provider, MD  Cholecalciferol (VITAMIN D3) 2000 UNITS TABS Take 1 tablet by mouth daily.   Yes Historical Provider, MD  clonazePAM (KLONOPIN) 1 MG tablet Take 1 mg by mouth 3 (three) times daily.  12/18/14  Yes Historical Provider, MD  cyanocobalamin 500 MCG tablet Take 500 mcg by mouth daily.   Yes Historical Provider, MD  fluticasone (FLONASE) 50 MCG/ACT nasal spray Place 1-2 sprays into both nostrils daily as needed for allergies.  12/16/14  Yes Historical Provider, MD  hydrochlorothiazide (HYDRODIURIL) 50 MG tablet Take 50 mg by mouth daily.   Yes Historical Provider, MD  LATUDA 40 MG TABS tablet Take 40 mg by mouth at bedtime.  03/22/15  Yes Historical Provider, MD  lovastatin (MEVACOR) 20 MG tablet Take 20 mg by mouth at bedtime.   Yes Historical  Provider, MD  magnesium citrate SOLN Take 1 Bottle by mouth once.   Yes Historical Provider, MD  meloxicam (MOBIC) 7.5 MG tablet Take 7.5 mg by mouth daily as needed for pain.    Yes Historical Provider, MD  omeprazole (PRILOSEC) 20 MG capsule TAKE ONE CAPSULE BY MOUTH 30 MINUTES PRIOR TO FIRST MEAL OF THE DAY. 03/10/16  Yes Carlis Stable, NP  potassium chloride (K-DUR) 10 MEQ tablet Take 10 mEq by mouth daily. 03/08/16  Yes Historical Provider, MD  sertraline (ZOLOFT) 100 MG tablet Take 100 mg by mouth daily. 12/18/14  Yes Historical Provider, MD  temazepam (RESTORIL) 15 MG capsule Take 15 mg by mouth at bedtime. 02/25/16  Yes Historical Provider, MD  topiramate (TOPAMAX) 50 MG tablet Take 50 mg by mouth daily. 03/07/16  Yes Historical Provider, MD  VESICARE 10 MG tablet Take 10 mg by mouth every evening. 02/25/16  Yes Historical Provider, MD    Family History Family History  Problem Relation Age of Onset  . Colon cancer Father     diagnosed in his 42s  .  Anesthesia problems Neg Hx   . Hypotension Neg Hx   . Malignant hyperthermia Neg Hx   . Pseudochol deficiency Neg Hx     Social History Social History  Substance Use Topics  . Smoking status: Current Every Day Smoker    Packs/day: 0.50    Years: 38.00    Types: Cigarettes    Last attempt to quit: 06/19/2014  . Smokeless tobacco: Never Used     Comment: vapor only  . Alcohol use No     Allergies   Review of patient's allergies indicates no known allergies.   Review of Systems Review of Systems  A complete 10 system review of systems was obtained and all systems are negative except as noted in the HPI and PMH.   Physical Exam Updated Vital Signs BP 117/78 (BP Location: Left Arm)   Pulse 74   Temp 98.5 F (36.9 C) (Temporal)   Resp 18   Ht 5\' 4"  (1.626 m)   Wt 182 lb (82.6 kg)   SpO2 100%   BMI 31.24 kg/m   Physical Exam  Constitutional: She is oriented to person, place, and time. She appears well-developed and  well-nourished. No distress.  HENT:  Head: Normocephalic and atraumatic.  Eyes: EOM are normal.  Neck: Normal range of motion.  Cardiovascular: Normal rate, regular rhythm and normal heart sounds.   Pulmonary/Chest: Effort normal and breath sounds normal.  Lungs CTA.  Abdominal: Soft. Bowel sounds are normal. She exhibits distension. There is tenderness.  Positive bowel sounds. Abdomen is distended but soft. Tenderness in lower quadrants.  Musculoskeletal: Normal range of motion.  Neurological: She is alert and oriented to person, place, and time.  Skin: Skin is warm and dry.  Psychiatric: She has a normal mood and affect. Judgment normal.  Nursing note and vitals reviewed.   ED Treatments / Results  Labs (all labs ordered are listed, but only abnormal results are displayed) Labs Reviewed  BASIC METABOLIC PANEL - Abnormal; Notable for the following:       Result Value   Chloride 114 (*)    Glucose, Bld 105 (*)    BUN 22 (*)    Creatinine, Ser 1.06 (*)    GFR calc non Af Amer 57 (*)    All other components within normal limits  CBC    EKG  EKG Interpretation None       Radiology Ct Abdomen Pelvis W Contrast  Result Date: 03/30/2016 CLINICAL DATA:  Subacute onset of constipation and cramping lower abdominal pain. Abdominal distention. Initial encounter. EXAM: CT ABDOMEN AND PELVIS WITH CONTRAST TECHNIQUE: Multidetector CT imaging of the abdomen and pelvis was performed using the standard protocol following bolus administration of intravenous contrast. CONTRAST:  122mL ISOVUE-300 IOPAMIDOL (ISOVUE-300) INJECTION 61% COMPARISON:  CT of the abdomen and pelvis from 01/15/2014, and abdominal ultrasound performed 01/19/2015 FINDINGS: Lower chest: The visualized lung bases are grossly clear. The visualized portions of the mediastinum are unremarkable. Hepatobiliary: Scattered hypodensities are noted within the liver, measuring up to 2.6 cm in size. These likely reflect cysts. The  gallbladder is grossly unremarkable in appearance. The common bile duct remains normal in caliber. Pancreas: The pancreas is within normal limits. Spleen: The spleen is unremarkable in appearance. Adrenals/Urinary Tract: The adrenal glands are unremarkable in appearance. Small right renal cysts are noted. There is no evidence of hydronephrosis. No renal or ureteral stones are identified. No perinephric stranding is seen. Stomach/Bowel: The stomach is unremarkable in appearance. The small bowel is  within normal limits. The appendix is normal in caliber, without evidence of appendicitis. The colon is largely filled with fluid. A large amount of dense stool is noted within the mid sigmoid colon, at the site of the patient's colonic anastomosis, causing the patient's constipation. The dense stool measures approximately 12 x 7 x 7 cm. This is approximately 18 cm above the external sphincter. The distal sigmoid colon is mostly empty. Vascular/Lymphatic: Scattered calcification is seen along the abdominal aorta and its branches. The abdominal aorta is otherwise grossly unremarkable. The inferior vena cava is grossly unremarkable. No retroperitoneal lymphadenopathy is seen. No pelvic sidewall lymphadenopathy is identified. Reproductive: The bladder is mildly distended and grossly unremarkable. The patient is status post partial hysterectomy. No suspicious adnexal masses are seen. The ovaries appear grossly symmetric. Other: No additional soft tissue abnormalities are seen. Musculoskeletal: No acute osseous abnormalities are identified. The visualized musculature is unremarkable in appearance. IMPRESSION: 1. Large amount of dense stool noted at the mid sigmoid colon, at the site of the patient's colonic anastomosis, causing the patient's constipation. The dense stool measures approximate 12 x 7 x 7 cm. This is approximately 18 cm above the external sphincter. The distal sigmoid colon is mostly empty. 2. Large amount of fluid  noted filling the remainder of the colon, proximal to the dense stool. 3. Scattered hypodensities within the liver, measuring up to 2.6 cm in size, likely reflect cysts. 4. Small right renal cysts seen. 5. Scattered aortic atherosclerosis noted. These results were called by telephone at the time of interpretation on 03/30/2016 at 1:59 am to Dr. Varney Biles, who verbally acknowledged these results. Electronically Signed   By: Garald Balding M.D.   On: 03/30/2016 01:59   Dg Abd Acute W/chest  Result Date: 03/30/2016 CLINICAL DATA:  Constipation. EXAM: DG ABDOMEN ACUTE W/ 1V CHEST COMPARISON:  Chest radiograph 05/16/2015.  CT abdomen 01/15/2014 FINDINGS: The cardiomediastinal contours are normal. Persistent peribronchial cuffing, unchanged. No focal airspace disease. There is no free intra-abdominal air. Scattered air-fluid levels within bowel loops in the right abdomen, likely a colon and small bowel. Small volume of colonic stool in the descending colon. Enteric sutures noted in the pelvis. An oblong density in the left upper quadrant is likely an ingested pill. No radiopaque calculi. No acute osseous abnormalities are seen. IMPRESSION: A few air-fluid levels in the right abdomen, likely a small and large bowel. This may be enteritis or focal ileus. CT is planned. No increased stool burden. Stable bronchial thickening without acute pulmonary abnormality. Electronically Signed   By: Jeb Levering M.D.   On: 03/30/2016 00:10    Procedures Procedures (including critical care time)  DIAGNOSTIC STUDIES: Oxygen Saturation is 96% on RA, adequate by my interpretation.    COORDINATION OF CARE: 11:12 PM Discussed treatment plan with pt at bedside and pt agreed to plan.  Medications Ordered in ED Medications  iopamidol (ISOVUE-300) 61 % injection (not administered)  mineral oil enema 1 enema (not administered)  iopamidol (ISOVUE-300) 61 % injection 100 mL (100 mLs Intravenous Contrast Given 03/30/16  0053)     Initial Impression / Assessment and Plan / ED Course  I have reviewed the triage vital signs and the nursing notes.  Pertinent labs & imaging results that were available during my care of the patient were reviewed by me and considered in my medical decision making (see chart for details).  Clinical Course  Comment By Time  AAS showed multiple air fluid levels, so we did get  CT. Pt has a large stool ball at the surgical site in the colon, with no distal passage of the contrast. We will give enema. If the enema fails I think pt will ne admission for repeat enema or Gi consult to see if they can help in breaking the stool ball. The obstruction appears to be at the site of previous surgery, so cancer recurrence is also possible. Varney Biles, MD 10/12 [phone]    I personally performed the services described in this documentation, which was scribed in my presence. The recorded information has been reviewed and is accurate.  Pt with hx of colon CA s/p resection of the tumor comes in with cc of abd pain. Abd pain is in the lower quadrants. Pt also has been constipated. Seems like she is just having "drops" of stool coming out. We will get AAS - and if suspicion for SBO heightens, we will get CT.  Final Clinical Impressions(s) / ED Diagnoses   Final diagnoses:  Constipation, unspecified constipation type    New Prescriptions New Prescriptions   No medications on file     Varney Biles, MD 03/30/16 0301

## 2016-03-30 ENCOUNTER — Other Ambulatory Visit (HOSPITAL_COMMUNITY)
Admission: RE | Admit: 2016-03-30 | Discharge: 2016-03-30 | Disposition: A | Discharge status: Home / Self Care | Payer: Medicare Other | Source: Ambulatory Visit | Attending: Internal Medicine | Admitting: Internal Medicine

## 2016-03-30 DIAGNOSIS — E876 Hypokalemia: Secondary | ICD-10-CM

## 2016-03-30 DIAGNOSIS — K5900 Constipation, unspecified: Secondary | ICD-10-CM | POA: Diagnosis not present

## 2016-03-30 LAB — BASIC METABOLIC PANEL
Anion gap: 5 (ref 5–15)
BUN: 22 mg/dL — AB (ref 6–20)
CO2: 25 mmol/L (ref 22–32)
Calcium: 9.5 mg/dL (ref 8.9–10.3)
Chloride: 111 mmol/L (ref 101–111)
Creatinine, Ser: 1.24 mg/dL — ABNORMAL HIGH (ref 0.44–1.00)
GFR calc Af Amer: 54 mL/min — ABNORMAL LOW (ref >60)
GFR, EST NON AFRICAN AMERICAN: 47 mL/min — AB (ref >60)
Glucose, Bld: 108 mg/dL — ABNORMAL HIGH (ref 65–99)
Potassium: 3.4 mmol/L — ABNORMAL LOW (ref 3.5–5.1)
SODIUM: 141 mmol/L (ref 135–145)

## 2016-03-30 MED ORDER — MINERAL OIL RE ENEM
1.0000 | ENEMA | Freq: Once | RECTAL | Status: AC
  Administered 2016-03-30: 1 via RECTAL
  Filled 2016-03-30: qty 1

## 2016-03-30 MED ORDER — IOPAMIDOL (ISOVUE-300) INJECTION 61%
100.0000 mL | Freq: Once | INTRAVENOUS | Status: AC | PRN
  Administered 2016-03-30: 100 mL via INTRAVENOUS

## 2016-03-30 NOTE — Discharge Instructions (Signed)
Eat fruits and vegetable to prevent constipation. Take fiber supplement if needed and always drink fluids. See your primary doctor in 1 week. See the GI doctor in 2-3 weeks - to ensure there is no need for repeat colonoscopy at this time.

## 2016-03-30 NOTE — ED Notes (Addendum)
Patient had large bowel movement,  Pt states she is is feeling much better and is feeling a lot better.  MD notified

## 2016-03-30 NOTE — ED Notes (Signed)
Patient to CT via stretcher.

## 2016-04-11 ENCOUNTER — Encounter (HOSPITAL_COMMUNITY): Payer: Self-pay | Admitting: Emergency Medicine

## 2016-04-11 ENCOUNTER — Emergency Department (HOSPITAL_COMMUNITY): Payer: Medicare Other

## 2016-04-11 ENCOUNTER — Emergency Department (HOSPITAL_COMMUNITY)
Admission: EM | Admit: 2016-04-11 | Discharge: 2016-04-11 | Disposition: A | Discharge status: Home / Self Care | Payer: Medicare Other | Attending: Emergency Medicine | Admitting: Emergency Medicine

## 2016-04-11 DIAGNOSIS — Z79899 Other long term (current) drug therapy: Secondary | ICD-10-CM | POA: Insufficient documentation

## 2016-04-11 DIAGNOSIS — M79605 Pain in left leg: Secondary | ICD-10-CM | POA: Insufficient documentation

## 2016-04-11 DIAGNOSIS — M79604 Pain in right leg: Secondary | ICD-10-CM

## 2016-04-11 DIAGNOSIS — Z791 Long term (current) use of non-steroidal anti-inflammatories (NSAID): Secondary | ICD-10-CM | POA: Insufficient documentation

## 2016-04-11 DIAGNOSIS — Z7951 Long term (current) use of inhaled steroids: Secondary | ICD-10-CM | POA: Diagnosis not present

## 2016-04-11 DIAGNOSIS — I10 Essential (primary) hypertension: Secondary | ICD-10-CM | POA: Diagnosis not present

## 2016-04-11 DIAGNOSIS — F17210 Nicotine dependence, cigarettes, uncomplicated: Secondary | ICD-10-CM | POA: Insufficient documentation

## 2016-04-11 DIAGNOSIS — Z85038 Personal history of other malignant neoplasm of large intestine: Secondary | ICD-10-CM | POA: Insufficient documentation

## 2016-04-11 DIAGNOSIS — R252 Cramp and spasm: Secondary | ICD-10-CM

## 2016-04-11 LAB — BASIC METABOLIC PANEL
ANION GAP: 6 (ref 5–15)
BUN: 23 mg/dL — ABNORMAL HIGH (ref 6–20)
CALCIUM: 9.6 mg/dL (ref 8.9–10.3)
CHLORIDE: 107 mmol/L (ref 101–111)
CO2: 26 mmol/L (ref 22–32)
Creatinine, Ser: 1.07 mg/dL — ABNORMAL HIGH (ref 0.44–1.00)
GFR calc Af Amer: >60 mL/min (ref >60)
GFR calc non Af Amer: 56 mL/min — ABNORMAL LOW (ref >60)
GLUCOSE: 91 mg/dL (ref 65–99)
Potassium: 3.3 mmol/L — ABNORMAL LOW (ref 3.5–5.1)
Sodium: 139 mmol/L (ref 135–145)

## 2016-04-11 LAB — MAGNESIUM: Magnesium: 1.8 mg/dL (ref 1.7–2.4)

## 2016-04-11 MED ORDER — CYCLOBENZAPRINE HCL 10 MG PO TABS
10.0000 mg | ORAL_TABLET | Freq: Once | ORAL | Status: AC
  Administered 2016-04-11: 10 mg via ORAL
  Filled 2016-04-11: qty 1

## 2016-04-11 MED ORDER — CYCLOBENZAPRINE HCL 10 MG PO TABS: 10.0000 mg | ORAL_TABLET | Freq: Two times a day (BID) | ORAL | 0 refills | Status: DC | PRN

## 2016-04-11 MED ORDER — HYDROCODONE-ACETAMINOPHEN 5-325 MG PO TABS: 1.0000 | ORAL_TABLET | Freq: Four times a day (QID) | ORAL | 0 refills | Status: DC | PRN

## 2016-04-11 MED ORDER — HYDROCODONE-ACETAMINOPHEN 5-325 MG PO TABS
1.0000 | ORAL_TABLET | Freq: Once | ORAL | Status: AC
  Administered 2016-04-11: 1 via ORAL
  Filled 2016-04-11: qty 1

## 2016-04-11 NOTE — ED Triage Notes (Signed)
Pt reports pain in her L leg that starts in her L achilles area and moves up into her groin. Pt non tender to palpation in her calf, no redness or edema noted. Pt denies any known injury. Pulse and sensation intact. Foot warm.

## 2016-04-11 NOTE — ED Provider Notes (Signed)
[facility] DEPT Provider Note   CSN: [id number] Arrival date & time: 04/11/16  1527     History   Chief Complaint Chief Complaint  Patient presents with  . Leg Pain    HPI Ashley Patrick is a 60 y.o. female with a past medical history significant for colon cancer, hypertension, hypercholesterolemia, bipolar disorder, and migraines who presents with left leg pain. Patient reports that for the last 3 days, she has had gradually worsening throbbing pain in her left leg. She says it is a cramping and throbbing sensation that is a 7 out of 10 severity. She reports it is located in her posterior leg in her Achilles area, calf, and posterior thigh. She denies any pain in her knee or ankle. She denies any pain in her hips, back pain, abdominal pain, or chest pain. She denies any fevers or chills. She denies any numbness, tingling, or weakness in the lower cavities. She denies any bowel or bladder problems. She denies any recent trauma or falls. She says that she has tried over-the-counter medications including nostril anti-inflammatories and Tylenol. She's also tried ice without improvement. She denies any recent skin injuries in her lower extremity or any cuts.   Leg Pain   This is a new problem. The problem occurs constantly. The problem has not changed since onset.The pain is present in the left lower leg. The quality of the pain is described as intermittent and pounding. The pain is at a severity of 7/10. The pain is severe. Pertinent negatives include no numbness, full range of motion, no stiffness and no itching. She has tried cold and OTC pain medications for the symptoms. The treatment provided no relief. There has been no history of extremity trauma.    Past Medical History:  Diagnosis Date  . Back pain   . Bipolar 1 disorder ([place])   . Cancer [facility]) 2014   Colon Cancer  . Hypercholesteremia   . Hypertension   . Lateral epicondylitis  of elbow    right  . Migraine headache   .  Nicotine addiction   . Obesity    History of  . OD (overdose of drug)    hospitalized for 11 days   . Psychosis     Patient Active Problem List   Diagnosis Date Noted  . Hiatal hernia   . History of colonic polyps   . Diverticulosis of colon without hemorrhage   . Constipation 07/22/2014  . History of colon cancer 07/20/2014  . Dysphagia 07/20/2014  . Lower abdominal pain 09/30/2013  . Bipolar affective disorder, current episode depressed ([place]) 08/08/2013  . Anxiety, generalized 08/08/2013  . 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 Noncompliance with medications 08/07/2013  . Colon cancer ([place]) 06/23/2013  . Rectal bleeding 05/09/2013  . Gastritis 05/09/2013  . LLQ pain 11/28/2012  . Hemorrhagic cyst of ovary 11/28/2012  . SHOULDER PAIN, LEFT 05/18/2010  . ACUTE BRONCHITIS 09/03/2008  . UNSPECIFIED URINARY INCONTINENCE 09/03/2008  . ACUTE CYSTITIS 07/19/2008  . Abdominal pain, generalized 07/09/2008  . OBESITY 11/22/2007  . UNSPECIFIED PSYCHOSIS 11/22/2007  . MIGRAINE HEADACHE 11/22/2007  . HYPERTENSION 11/22/2007  . BACK PAIN 11/22/2007  . LATERAL EPICONDYLITIS OF ELBOW 11/22/2007    Past Surgical History:  Procedure Laterality Date  . ABDOMINAL HYSTERECTOMY    . BIOPSY N/A 08/13/2014   Procedure: BIOPSY;  Surgeon: Daneil Dolin, MD;  Location: [facility] ORS;  Service: Endoscopy;  Laterality: N/A;  . BREAST BIOPSY  10/18/2011  Procedure: BREAST BIOPSY;  Surgeon: Jamesetta So, MD;  Location: [facility] ORS;  Service: General;  Laterality: Left;  . COLON RESECTION N/A 06/23/2013   Procedure: HAND ASSISTED LAPAROSCOPIC PARTIAL COLECTOMY;  Surgeon: Jamesetta So, MD;  Location: [facility] OR[facility];  Service: General;  Laterality: N/A;  . COLONOSCOPY    . COLONOSCOPY N/A 05/29/2013   [id number] mass most likely representing colorectal cancer S/ P biospy.Multiple colonic and rectal polyps removed/treated as described above. Colonic diverticulosis  . COLONOSCOPY WITH  PROPOFOL N/A 08/13/2014   RMR: Status post sigmoid colectomy. Multiple colonic polyps removed as described above. Redundant colon. Pan colonic diverticuloisi  . ESOPHAGEAL DILATION N/A 08/13/2014   Procedure: [place];  Surgeon: Daneil Dolin, MD;  Location: [facility] OR[facility];  Service: Endoscopy;  Laterality: N/A;  . ESOPHAGOGASTRODUODENOSCOPY N/A 12/26/2012   [id number] reflux esophagitis. Gastric and duodenal bulbar erosions-status post gastric biopsynegative H.pylori  . ESOPHAGOGASTRODUODENOSCOPY (EGD) WITH PROPOFOL N/A 08/13/2014   RMR: Small benign cystic-appearing lesion in distal esophagus of doubtful clinical significance, otherwise normal esophagus, status post Maloney dilation. Small hiatal hernia, some retained gastric contents (query delayed gastric emptying.)  . partial hysterectomy    . POLYPECTOMY N/A 08/13/2014   Procedure: POLYPECTOMY;  Surgeon: Daneil Dolin, MD;  Location: [facility];  Service: Endoscopy;  Laterality: N/A;    OB History    Gravida Para Term Preterm AB Living   4 3 3   1 3    SAB TAB Ectopic Multiple Live Births     1             Home Medications    Prior to Admission medications   Medication Sig Start Date End Date Taking? Authorizing Provider  amLODipine-benazepril (LOTREL) 10-20 MG per capsule Take 1 capsule by mouth daily.  12/04/14   Historical Provider, MD  ARIPiprazole (ABILIFY) 15 MG tablet Take 15 mg by mouth every evening.  02/25/16   Historical Provider, MD  Cholecalciferol (VITAMIN D3) 2000 UNITS TABS Take 1 tablet by mouth daily.    Historical Provider, MD  clonazePAM (KLONOPIN) 1 MG tablet Take 1 mg by mouth 3 (three) times daily.  12/18/14   Historical Provider, MD  cyanocobalamin 500 MCG tablet Take 500 mcg by mouth daily.    Historical Provider, MD  fluticasone (FLONASE) 50 MCG/ACT nasal spray Place 1-2 sprays into both nostrils daily as needed for allergies.  12/16/14   Historical Provider, MD  hydrochlorothiazide  (HYDRODIURIL) 50 MG tablet Take 50 mg by mouth daily.    Historical Provider, MD  LATUDA 40 MG TABS tablet Take 40 mg by mouth at bedtime.  03/22/15   Historical Provider, MD  lovastatin (MEVACOR) 20 MG tablet Take 20 mg by mouth at bedtime.    Historical Provider, MD  magnesium citrate SOLN Take 1 Bottle by mouth once.    Historical Provider, MD  meloxicam (MOBIC) 7.5 MG tablet Take 7.5 mg by mouth daily as needed for pain.     Historical Provider, MD  omeprazole (PRILOSEC) 20 MG capsule TAKE ONE CAPSULE BY MOUTH 30 MINUTES PRIOR TO FIRST MEAL OF THE DAY. 03/10/16   Carlis Stable, NP  potassium chloride (K-DUR) 10 MEQ tablet Take 10 mEq by mouth daily. 03/08/16   Historical Provider, MD  sertraline (ZOLOFT) 100 MG tablet Take 100 mg by mouth daily. 12/18/14   Historical Provider, MD  temazepam (RESTORIL) 15 MG capsule Take 15 mg by mouth at bedtime. 02/25/16   Historical Provider, MD  topiramate (TOPAMAX) 50 MG tablet Take 50 mg by mouth daily. 03/07/16   Historical Provider, MD  VESICARE 10 MG tablet Take 10 mg by mouth every evening. 02/25/16   Historical Provider, MD    Family History Family History  Problem Relation Age of Onset  . Colon cancer Father     diagnosed in his 69s  . Anesthesia problems Neg Hx   . Hypotension Neg Hx   . Malignant hyperthermia Neg Hx   . Pseudochol deficiency Neg Hx     Social History Social History  Substance Use Topics  . Smoking status: Current Every Day Smoker    Packs/day: 0.50    Years: 38.00    Types: Cigarettes    Last attempt to quit: 06/19/2014  . Smokeless tobacco: Never Used     Comment: vapor only  . Alcohol use No     Allergies   Review of patient's allergies indicates no known allergies.   Review of Systems Review of Systems  Constitutional: Negative for activity change, chills, diaphoresis, fatigue and fever.  HENT: Negative for congestion and rhinorrhea.   Eyes: Negative for visual disturbance.  Respiratory: Negative for cough, chest  tightness, shortness of breath and stridor.   Cardiovascular: Negative for chest pain, palpitations and leg swelling.  Gastrointestinal: Negative for abdominal distention, abdominal pain, constipation, diarrhea, nausea and vomiting.  Genitourinary: Negative for difficulty urinating, dysuria, flank pain, frequency, hematuria, menstrual problem, pelvic pain, vaginal bleeding and vaginal discharge.  Musculoskeletal: Positive for myalgias (left leg). Negative for back pain, neck pain, neck stiffness and stiffness.  Skin: Negative for itching, rash and wound.  Neurological: Negative for dizziness, weakness, light-headedness, numbness and headaches.  Psychiatric/Behavioral: Negative for agitation and confusion.  All other systems reviewed and are negative.    Physical Exam Updated Vital Signs BP 120/74   Pulse 96   Temp 97.8 F (36.6 C) (Oral)   Resp 17   Ht 5\' 6"  (1.676 m)   Wt 190 lb (86.2 kg)   SpO2 100%   BMI 30.67 kg/m   Physical Exam  Constitutional: She is oriented to person, place, and time. She appears well-developed and well-nourished. No distress.  HENT:  Head: Normocephalic and atraumatic.  Eyes: Conjunctivae are normal.  Neck: Neck supple.  Cardiovascular: Normal rate and regular rhythm.   No murmur heard. Pulmonary/Chest: Effort normal and breath sounds normal. No respiratory distress.  Abdominal: Soft. There is no tenderness.  Musculoskeletal: She exhibits tenderness. She exhibits no edema or deformity.       Legs: Bilateral lower extremities had normal pulses, strength, and sensation.  Neurological: She is alert and oriented to person, place, and time. She has normal reflexes. She displays normal reflexes. No cranial nerve deficit or sensory deficit. She exhibits normal muscle tone. Coordination normal.  Skin: Skin is warm and dry.  Psychiatric: She has a normal mood and affect.  Nursing note and vitals reviewed.    ED Treatments / Results  Labs (all labs  ordered are listed, but only abnormal results are displayed) Labs Reviewed  BASIC METABOLIC PANEL - Abnormal; Notable for the following:       Result Value   Potassium 3.3 (*)    BUN 23 (*)    Creatinine, Ser 1.07 (*)    GFR calc non Af Amer 56 (*)    All other components within normal limits  MAGNESIUM    EKG  EKG Interpretation None       Radiology US Venous Img Lower  Unilateral Left  Result Date: 04/11/2016 CLINICAL DATA:  Acute left lower extremity pain for 3 days EXAM: LEFT LOWER EXTREMITY VENOUS DOPPLER ULTRASOUND TECHNIQUE: Gray-scale sonography with graded compression, as well as color Doppler and duplex ultrasound were performed to evaluate the lower extremity deep venous systems from the level of the common femoral vein and including the common femoral, femoral, profunda femoral, popliteal and calf veins including the posterior tibial, peroneal and gastrocnemius veins when visible. The superficial great saphenous vein was also interrogated. Spectral Doppler was utilized to evaluate flow at rest and with distal augmentation maneuvers in the common femoral, femoral and popliteal veins. COMPARISON:  None. FINDINGS: Contralateral Common Femoral Vein: Respiratory phasicity is normal and symmetric with the symptomatic side. No evidence of thrombus. Normal compressibility. Common Femoral Vein: No evidence of thrombus. Normal compressibility, respiratory phasicity and response to augmentation. Saphenofemoral Junction: No evidence of thrombus. Normal compressibility and flow on color Doppler imaging. Profunda Femoral Vein: No evidence of thrombus. Normal compressibility and flow on color Doppler imaging. Femoral Vein: No evidence of thrombus. Normal compressibility, respiratory phasicity and response to augmentation. Popliteal Vein: No evidence of thrombus. Normal compressibility, respiratory phasicity and response to augmentation. Calf Veins: No evidence of thrombus. Normal compressibility  and flow on color Doppler imaging. Superficial Great Saphenous Vein: No evidence of thrombus. Normal compressibility and flow on color Doppler imaging. Venous Reflux:  None. Other Findings:  None. IMPRESSION: No evidence of deep venous thrombosis. Electronically Signed   By: Jerilynn Mages.  Shick M.D.   On: 04/11/2016 16:31    Procedures Procedures (including critical care time)  Medications Ordered in ED Medications - No data to display   Initial Impression / Assessment and Plan / ED Course  I have reviewed the triage vital signs and the nursing notes.  Pertinent labs & imaging results that were available during my care of the patient were reviewed by me and considered in my medical decision making (see chart for details).  Clinical Course    ARALYNN LOSEKE is a 59 y.o. female with a past medical history significant for colon cancer, hypertension, hypercholesterolemia, bipolar disorder, and migraines who presents with left leg pain.  History and exam are seen above.  Exam significant for tenderness and Muscle cramping in left calf. Normal strength, sensation, and pulses.  Due to unilateral leg pain, DVT study ordered. This was negative for clot. Due to muscle cramping felt on exam and symptoms described, electrolyte testing was performed to look for abnormality. Electrolytes were only significant for mild hypokalemia which is similar to prior. Patient reports she will take her potassium when she gets home. Magnesium not abnormal. Normal calcium.  Patient given pain medicine and muscle relaxant with some improvement in discomfort. Given lack of trauma And no bony tenderness, x-rays were not obtained. Patients tenderness and pain are in the muscles of the left leg. As cramping was seen, suspect this is the cause of her pain.  Patient and her pain medicine and muscle relaxants. Patient instructed to follow up with PCP for further management. Patient given return precautions for any new or worsening  symptoms. Patient had no other questions or concerns and was discharged in good condition.   Final Clinical Impressions(s) / ED Diagnoses   Final diagnoses:  Right leg pain  Muscle cramp     Clinical Impression: 1. Right leg pain   2. Muscle cramp     Disposition: Discharge  Condition: Good  I have discussed the results, Dx and Tx plan  with the pt(& family if present). He/she/they expressed understanding and agree(s) with the plan. Discharge instructions discussed at great length. Strict return precautions discussed and pt &/or family have verbalized understanding of the instructions. No further questions at time of discharge.    Discharge Medication List as of 04/11/2016  6:57 PM    START taking these medications   Details  cyclobenzaprine (FLEXERIL) 10 MG tablet Take 1 tablet (10 mg total) by mouth 2 (two) times daily as needed for muscle spasms., Starting Tue 04/11/2016, Print    HYDROcodone-acetaminophen (NORCO/VICODIN) 5-325 MG tablet Take 1 tablet by mouth every 6 (six) hours as needed., Starting Tue 04/11/2016, Print        Follow Up: Marjean Donna, MD [place] [address] [phone]        Courtney Paris, MD 04/12/16 1630

## 2016-04-12 ENCOUNTER — Other Ambulatory Visit: Payer: Self-pay | Admitting: Nurse Practitioner

## 2016-04-12 NOTE — Telephone Encounter (Signed)
Needs OV for further refills

## 2016-04-13 ENCOUNTER — Encounter: Payer: Self-pay | Admitting: Internal Medicine

## 2016-04-13 NOTE — Telephone Encounter (Signed)
APPT MADE AND LETTER SENT  °

## 2016-04-13 NOTE — Telephone Encounter (Signed)
Please schedule ov.  

## 2016-05-02 ENCOUNTER — Other Ambulatory Visit: Payer: Self-pay

## 2016-05-02 ENCOUNTER — Ambulatory Visit (INDEPENDENT_AMBULATORY_CARE_PROVIDER_SITE_OTHER): Payer: Medicare Other | Admitting: Gastroenterology

## 2016-05-02 ENCOUNTER — Telehealth: Payer: Self-pay

## 2016-05-02 ENCOUNTER — Encounter: Payer: Self-pay | Admitting: Gastroenterology

## 2016-05-02 VITALS — BP 91/64 | HR 95 | Temp 97.2°F | Ht 66.0 in | Wt 188.6 lb

## 2016-05-02 DIAGNOSIS — K219 Gastro-esophageal reflux disease without esophagitis: Secondary | ICD-10-CM | POA: Diagnosis not present

## 2016-05-02 DIAGNOSIS — C189 Malignant neoplasm of colon, unspecified: Secondary | ICD-10-CM

## 2016-05-02 DIAGNOSIS — K5900 Constipation, unspecified: Secondary | ICD-10-CM

## 2016-05-02 DIAGNOSIS — R933 Abnormal findings on diagnostic imaging of other parts of digestive tract: Secondary | ICD-10-CM

## 2016-05-02 DIAGNOSIS — Z85048 Personal history of other malignant neoplasm of rectum, rectosigmoid junction, and anus: Secondary | ICD-10-CM

## 2016-05-02 DIAGNOSIS — Z85038 Personal history of other malignant neoplasm of large intestine: Secondary | ICD-10-CM

## 2016-05-02 MED ORDER — PEG 3350-KCL-NA BICARB-NACL 420 G PO SOLR: 4000.0000 mL | ORAL | 0 refills | Status: DC

## 2016-05-02 NOTE — Assessment & Plan Note (Signed)
Continue omeprazole for now

## 2016-05-02 NOTE — Telephone Encounter (Signed)
LMOM and informed pt of pre-op appt 05/18/16 at 1:45pm. Letter also mailed.

## 2016-05-02 NOTE — Patient Instructions (Signed)
1. Continue omeprazole 20 mg daily as before for acid reflux. 2. Continue Linzess. 3. Colonoscopy with Dr. Gala Romney. Please see separate instructions.

## 2016-05-02 NOTE — Assessment & Plan Note (Signed)
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 Colonoscopy February 2016 with multiple additional tubular adenomas removed. Recently developed worsening constipation. CT scan obtained and showed largely filled colon with fluid, large amount of dense stool noted in the mid sigmoid colon at the site of the patient's colonic anastomosis causing constipation.  Given CT findings, patient's change in bowel habits, history of colon cancer we'll go ahead and pursue colonoscopy at this time to evaluate her colonic anastomosis and for surveillance of colon polyps. Colonoscopy to be done with deep sedation in the OR.  I have discussed the risks, alternatives, benefits with regards to but not limited to the risk of reaction to medication, bleeding, infection, perforation and the patient is agreeable to proceed. Written consent to be obtained.  Continue Linzess for now.

## 2016-05-02 NOTE — Progress Notes (Signed)
Primary Care Physician: Lanette Hampshire, MD  Primary Gastroenterologist:  Garfield Cornea, MD   Chief Complaint  Patient presents with  . Follow-up  . Constipation    HPI: Ashley Patrick is a 60 y.o. female here for follow-up. She has a history of GERD, colon cancer. 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 She had a partial colectomy January 2015.  Tumor staged at T2, N0, M0. Seen by oncology 07/21/13 with no additional treatment indicated, and colonoscopy 1 year and if no polyps repeat every 3 years. No follow-up appt with oncology made.  She had a colonoscopy February 2016 patient with redundant and elongated colon requiring multiple position changes and external pressure to reach the cecum. A 25 cm, surgical anastomosis was identified. It was 8 mm pedunculated multilobulated polyp at this level with an adjacent diminutive polyp. At the splenic flexure there were 2 more 5 mm polyps and there was one in the descending colon as well. Pathology revealed tubular adenomas. She was advised to have a repeat colonoscopy in 2 years.  Patient reports increasing difficulty having a BM. Recently started on Linzess 145 g daily per PCP. She states she cannot take it daily because it causes significant loose stools. She takes it every 3 days and is well-tolerated that way. Generally has a bowel movement every day. Prior to [place] she was having significant difficulty going to the bathroom. Would have to strain, hemorrhoids come out, bright red blood per rectum. She does have lower abdominal pain as well as chronic upper abdominal pain. Reports good appetite. She reports 20 pound weight loss which is well-documented over the past one year however. She states she eats a whole lot. Recent thyroid function normal. Hemoglobin A1c normal. Denies heartburn. No  dysphagia. No vomiting. Denies NSAID use/aspirin powder use. No longer on anti-inflammatories. EGD done in February 2016, minimal retained gastric contents that exam was thorough. Small benign cystic-appearing lesion in the distal esophagus, small hiatal hernia.     Current Outpatient Prescriptions  Medication Sig Dispense Refill  . amLODipine-benazepril (LOTREL) 10-20 MG per capsule Take 1 capsule by mouth daily.     . ARIPiprazole (ABILIFY) 15 MG tablet Take 15 mg by mouth every evening.     . Cholecalciferol (VITAMIN D3) 2000 UNITS TABS Take 1 tablet by mouth daily.    . clonazePAM (KLONOPIN) 1 MG tablet Take 1 mg by mouth 3 (three) times daily.     . fluticasone (FLONASE) 50 MCG/ACT nasal spray Place 1-2 sprays into both nostrils daily as needed for allergies.     . hydrochlorothiazide (HYDRODIURIL) 50 MG tablet Take 50 mg by mouth daily.    Marland Kitchen linaclotide (LINZESS) 145 MCG CAPS capsule Take 145 mcg by mouth daily before breakfast.    . lovastatin (MEVACOR) 20 MG tablet Take 20 mg by mouth at bedtime.    Marland Kitchen omeprazole (PRILOSEC) 20 MG capsule TAKE ONE CAPSULE BY MOUTH 30 MINUTES PRIOR TO FIRST MEAL OF THE DAY. 30 capsule 1  . temazepam (RESTORIL) 15 MG capsule Take 15 mg by mouth at bedtime.    . topiramate (TOPAMAX) 50 MG tablet Take 50 mg by mouth daily.    . VESICARE 10 MG tablet Take 10 mg by mouth every evening.     No current facility-administered medications for this visit.  Allergies as of 05/02/2016  . (No Known Allergies)   Past Medical History:  Diagnosis Date  . Back pain   . Bipolar 1 disorder ([place])   . Cancer [facility]) 2014   Colon Cancer  . Hypercholesteremia   . Hypertension   . Lateral epicondylitis  of elbow    right  . Migraine headache   . Nicotine addiction   . Obesity    History of  . OD (overdose of drug)    hospitalized for 11 days   . Psychosis    Past Surgical History:  Procedure Laterality Date  . ABDOMINAL HYSTERECTOMY    . BIOPSY N/A  08/13/2014   Procedure: BIOPSY;  Surgeon: Daneil Dolin, MD;  Location: [facility];  Service: Endoscopy;  Laterality: N/A;  . BREAST BIOPSY  10/18/2011   Procedure: BREAST BIOPSY;  Surgeon: Jamesetta So, MD;  Location: [facility];  Service: General;  Laterality: Left;  . COLON RESECTION N/A 06/23/2013   Procedure: HAND ASSISTED LAPAROSCOPIC PARTIAL COLECTOMY;  Surgeon: Jamesetta So, MD;  Location: [facility];  Service: General;  Laterality: N/A;  . COLONOSCOPY    . COLONOSCOPY N/A 05/29/2013   [id number] mass most likely representing colorectal cancer S/ P biospy.Multiple colonic and rectal polyps removed/treated as described above. Colonic diverticulosis  . COLONOSCOPY WITH PROPOFOL N/A 08/13/2014   RMR: Status post sigmoid colectomy. Multiple colonic polyps removed as described above. Redundant colon. Pan colonic diverticuloisi  . ESOPHAGEAL DILATION N/A 08/13/2014   Procedure: [place];  Surgeon: Daneil Dolin, MD;  Location: [facility];  Service: Endoscopy;  Laterality: N/A;  . ESOPHAGOGASTRODUODENOSCOPY N/A 12/26/2012   [id number] reflux esophagitis. Gastric and duodenal bulbar erosions-status post gastric biopsynegative H.pylori  . ESOPHAGOGASTRODUODENOSCOPY (EGD) WITH PROPOFOL N/A 08/13/2014   RMR: Small benign cystic-appearing lesion in distal esophagus of doubtful clinical significance, otherwise normal esophagus, status post Maloney dilation. Small hiatal hernia, some retained gastric contents (query delayed gastric emptying.)  . partial hysterectomy    . POLYPECTOMY N/A 08/13/2014   Procedure: POLYPECTOMY;  Surgeon: Daneil Dolin, MD;  Location: [facility];  Service: Endoscopy;  Laterality: N/A;   Family History  Problem Relation Age of Onset  . Colon cancer Father     diagnosed in his 73s  . Anesthesia problems Neg Hx   . Hypotension Neg Hx   . Malignant hyperthermia Neg Hx   . Pseudochol deficiency Neg Hx    Social History   Social History  . Marital status:  Legally Separated    Spouse name: N/A  . Number of children: 3  . Years of education: N/A   Social History Main Topics  . Smoking status: Current Every Day Smoker    Packs/day: 0.50    Years: 38.00    Types: Cigarettes    Last attempt to quit: 06/19/2014  . Smokeless tobacco: Never Used     Comment: vapor only  . Alcohol use No  . Drug use: No  . Sexual activity: Yes    Birth control/ protection: Surgical   Other Topics Concern  . None   Social History Narrative  . None    ROS:  General: Negative for anorexia,fever, chills, fatigue, weakness.See history of present illness ENT: Negative for hoarseness, difficulty swallowing , nasal congestion. CV: Negative for chest pain, angina, palpitations, dyspnea on exertion, peripheral edema.  Respiratory: Negative for dyspnea at rest, dyspnea on exertion, cough, sputum, wheezing.  GI: See history of present illness. GU:  Negative  for dysuria, hematuria, urinary incontinence, urinary frequency, nocturnal urination.  Endo: See history of present illness.    Physical Examination:   BP 91/64   Pulse 95   Temp 97.2 F (36.2 C) (Oral)   Ht 5\' 6"  (1.676 m)   Wt 188 lb 9.6 oz (85.5 kg)   BMI 30.44 kg/m   General: Well-nourished, well-developed in no acute distress.  Eyes: No icterus. Mouth: Oropharyngeal mucosa moist and pink , no lesions erythema or exudate. Lungs: Clear to auscultation bilaterally.  Heart: Regular rate and rhythm, no murmurs rubs or gallops.  Abdomen: Bowel sounds are normal, mild epigastric and lower abdominal tenderness, nondistended, no hepatosplenomegaly or masses, no abdominal bruits or hernia , no rebound or guarding.   Extremities: No lower extremity edema. No clubbing or deformities. Neuro: Alert and oriented x 4   Skin: Warm and dry, no jaundice.   Psych: Alert and cooperative, normal mood and affect.  Labs:  Lab Results  Component Value Date   CREATININE 1.07 (H) 04/11/2016   BUN 23 (H) 04/11/2016     NA 139 04/11/2016   K 3.3 (L) 04/11/2016   CL 107 04/11/2016   CO2 26 04/11/2016   Lab Results  Component Value Date   WBC 5.7 03/29/2016   HGB 13.7 03/29/2016   HCT 39.1 03/29/2016   MCV 87.7 03/29/2016   PLT 182 03/29/2016   Lab Results  Component Value Date   ALT 17 02/29/2016   AST 16 02/29/2016   ALKPHOS 73 02/29/2016   BILITOT 0.6 02/29/2016   Lab Results  Component Value Date   TSH 1.655 02/29/2016   Lab Results  Component Value Date   HGBA1C 5.6 02/29/2016    Imaging Studies: US Venous Img Lower Unilateral Left  Result Date: 04/11/2016 CLINICAL DATA:  Acute left lower extremity pain for 3 days EXAM: LEFT LOWER EXTREMITY VENOUS DOPPLER ULTRASOUND TECHNIQUE: Gray-scale sonography with graded compression, as well as color Doppler and duplex ultrasound were performed to evaluate the lower extremity deep venous systems from the level of the common femoral vein and including the common femoral, femoral, profunda femoral, popliteal and calf veins including the posterior tibial, peroneal and gastrocnemius veins when visible. The superficial great saphenous vein was also interrogated. Spectral Doppler was utilized to evaluate flow at rest and with distal augmentation maneuvers in the common femoral, femoral and popliteal veins. COMPARISON:  None. FINDINGS: Contralateral Common Femoral Vein: Respiratory phasicity is normal and symmetric with the symptomatic side. No evidence of thrombus. Normal compressibility. Common Femoral Vein: No evidence of thrombus. Normal compressibility, respiratory phasicity and response to augmentation. Saphenofemoral Junction: No evidence of thrombus. Normal compressibility and flow on color Doppler imaging. Profunda Femoral Vein: No evidence of thrombus. Normal compressibility and flow on color Doppler imaging. Femoral Vein: No evidence of thrombus. Normal compressibility, respiratory phasicity and response to augmentation. Popliteal Vein: No evidence  of thrombus. Normal compressibility, respiratory phasicity and response to augmentation. Calf Veins: No evidence of thrombus. Normal compressibility and flow on color Doppler imaging. Superficial Great Saphenous Vein: No evidence of thrombus. Normal compressibility and flow on color Doppler imaging. Venous Reflux:  None. Other Findings:  None. IMPRESSION: No evidence of deep venous thrombosis. Electronically Signed   By: Jerilynn Mages.  Shick M.D.   On: 04/11/2016 16:31   CLINICAL DATA:  Subacute onset of constipation and cramping lower abdominal pain. Abdominal distention. Initial encounter.  EXAM: CT ABDOMEN AND PELVIS WITH CONTRAST  TECHNIQUE: Multidetector CT imaging of the abdomen and  pelvis was performed using the standard protocol following bolus administration of intravenous contrast.  CONTRAST:  152mL ISOVUE-300 IOPAMIDOL (ISOVUE-300) INJECTION 61%  COMPARISON:  CT of the abdomen and pelvis from 01/15/2014, and abdominal ultrasound performed 01/19/2015  FINDINGS: Lower chest: The visualized lung bases are grossly clear. The visualized portions of the mediastinum are unremarkable.  Hepatobiliary: Scattered hypodensities are noted within the liver, measuring up to 2.6 cm in size. These likely reflect cysts. The gallbladder is grossly unremarkable in appearance. The common bile duct remains normal in caliber.  Pancreas: The pancreas is within normal limits.  Spleen: The spleen is unremarkable in appearance.  Adrenals/Urinary Tract: The adrenal glands are unremarkable in appearance. Small right renal cysts are noted. There is no evidence of hydronephrosis. No renal or ureteral stones are identified. No perinephric stranding is seen.  Stomach/Bowel: The stomach is unremarkable in appearance. The small bowel is within normal limits. The appendix is normal in caliber, without evidence of appendicitis.  The colon is largely filled with fluid. A large amount of dense stool is  noted within the mid sigmoid colon, at the site of the patient's colonic anastomosis, causing the patient's constipation. The dense stool measures approximately 12 x 7 x 7 cm. This is approximately 18 cm above the external sphincter. The distal sigmoid colon is mostly empty.  Vascular/Lymphatic: Scattered calcification is seen along the abdominal aorta and its branches. The abdominal aorta is otherwise grossly unremarkable. The inferior vena cava is grossly unremarkable. No retroperitoneal lymphadenopathy is seen. No pelvic sidewall lymphadenopathy is identified.  Reproductive: The bladder is mildly distended and grossly unremarkable. The patient is status post partial hysterectomy. No suspicious adnexal masses are seen. The ovaries appear grossly symmetric.  Other: No additional soft tissue abnormalities are seen.  Musculoskeletal: No acute osseous abnormalities are identified. The visualized musculature is unremarkable in appearance.  IMPRESSION: 1. Large amount of dense stool noted at the mid sigmoid colon, at the site of the patient's colonic anastomosis, causing the patient's constipation. The dense stool measures approximate 12 x 7 x 7 cm. This is approximately 18 cm above the external sphincter. The distal sigmoid colon is mostly empty. 2. Large amount of fluid noted filling the remainder of the colon, proximal to the dense stool. 3. Scattered hypodensities within the liver, measuring up to 2.6 cm in size, likely reflect cysts. 4. Small right renal cysts seen. 5. Scattered aortic atherosclerosis noted. These results were called by telephone at the time of interpretation on 03/30/2016 at 1:59 am to Dr. Varney Biles, who verbally acknowledged these results.   Electronically Signed   By: Garald Balding M.D.   On: 03/30/2016 01:59

## 2016-05-02 NOTE — Progress Notes (Signed)
cc'ed to pcp °

## 2016-05-16 ENCOUNTER — Ambulatory Visit (INDEPENDENT_AMBULATORY_CARE_PROVIDER_SITE_OTHER): Payer: Medicare Other | Admitting: Orthopedic Surgery

## 2016-05-16 ENCOUNTER — Ambulatory Visit (INDEPENDENT_AMBULATORY_CARE_PROVIDER_SITE_OTHER): Payer: Medicare Other

## 2016-05-16 ENCOUNTER — Encounter: Payer: Self-pay | Admitting: Orthopedic Surgery

## 2016-05-16 VITALS — BP 112/63 | HR 95 | Wt 189.0 lb

## 2016-05-16 DIAGNOSIS — M7661 Achilles tendinitis, right leg: Secondary | ICD-10-CM

## 2016-05-16 DIAGNOSIS — M25572 Pain in left ankle and joints of left foot: Secondary | ICD-10-CM | POA: Diagnosis not present

## 2016-05-16 DIAGNOSIS — M7662 Achilles tendinitis, left leg: Secondary | ICD-10-CM

## 2016-05-16 MED ORDER — PREDNISONE 10 MG PO TABS: 10.0000 mg | ORAL_TABLET | Freq: Two times a day (BID) | ORAL | 1 refills | Status: DC

## 2016-05-16 NOTE — Progress Notes (Signed)
Patient ID: Ashley Patrick, female   DOB: 1956-02-18, 60 y.o.   MRN: [id number]  Chief Complaint  Patient presents with  . Follow-up    LEFT ANKLE PAIN    HPI Ashley Patrick is a 60 y.o. female.  Presents with bilateral ankle pain right greater than left  Complains of moderate to severe aching dull pain behind the heel of both Achilles with swelling painful weight bearing  X 4 MONTHS  Previous treatment includes Naprosyn without relief   Review of Systems Review of Systems  Respiratory: Negative.   Cardiovascular: Negative.   Neurological: Negative for weakness and numbness.     Past Medical History:  Diagnosis Date  . Back pain   . Bipolar 1 disorder ([place])   . Cancer [facility]) 2014   Colon Cancer  . Hypercholesteremia   . Hypertension   . Lateral epicondylitis  of elbow    right  . Migraine headache   . Nicotine addiction   . Obesity    History of  . OD (overdose of drug)    hospitalized for 11 days   . Psychosis     Past Surgical History:  Procedure Laterality Date  . ABDOMINAL HYSTERECTOMY    . BIOPSY N/A 08/13/2014   Procedure: BIOPSY;  Surgeon: Daneil Dolin, MD;  Location: [facility];  Service: Endoscopy;  Laterality: N/A;  . BREAST BIOPSY  10/18/2011   Procedure: BREAST BIOPSY;  Surgeon: Jamesetta So, MD;  Location: [facility];  Service: General;  Laterality: Left;  . COLON RESECTION N/A 06/23/2013   Procedure: HAND ASSISTED LAPAROSCOPIC PARTIAL COLECTOMY;  Surgeon: Jamesetta So, MD;  Location: [facility];  Service: General;  Laterality: N/A;  . COLONOSCOPY    . COLONOSCOPY N/A 05/29/2013   [id number] mass most likely representing colorectal cancer S/ P biospy.Multiple colonic and rectal polyps removed/treated as described above. Colonic diverticulosis  . COLONOSCOPY WITH PROPOFOL N/A 08/13/2014   RMR: Status post sigmoid colectomy. Multiple colonic polyps removed as described above. Redundant colon. Pan colonic diverticuloisi  . ESOPHAGEAL DILATION N/A 08/13/2014    Procedure: [place];  Surgeon: Daneil Dolin, MD;  Location: [facility];  Service: Endoscopy;  Laterality: N/A;  . ESOPHAGOGASTRODUODENOSCOPY N/A 12/26/2012   [id number] reflux esophagitis. Gastric and duodenal bulbar erosions-status post gastric biopsynegative H.pylori  . ESOPHAGOGASTRODUODENOSCOPY (EGD) WITH PROPOFOL N/A 08/13/2014   RMR: Small benign cystic-appearing lesion in distal esophagus of doubtful clinical significance, otherwise normal esophagus, status post Maloney dilation. Small hiatal hernia, some retained gastric contents (query delayed gastric emptying.)  . partial hysterectomy    . POLYPECTOMY N/A 08/13/2014   Procedure: POLYPECTOMY;  Surgeon: Daneil Dolin, MD;  Location: [facility];  Service: Endoscopy;  Laterality: N/A;    Social History Social History  Substance Use Topics  . Smoking status: Current Every Day Smoker    Packs/day: 0.50    Years: 38.00    Types: Cigarettes    Last attempt to quit: 06/19/2014  . Smokeless tobacco: Never Used     Comment: vapor only  . Alcohol use No    No Known Allergies  Current Meds  Medication Sig  . amLODipine-benazepril (LOTREL) 10-20 MG per capsule Take 1 capsule by mouth daily.   . ARIPiprazole (ABILIFY) 15 MG tablet Take 15 mg by mouth every evening.   . Cholecalciferol (VITAMIN D3) 2000 UNITS TABS Take 1 tablet by mouth daily.  . clonazePAM (KLONOPIN) 1 MG tablet Take 1 mg by mouth 3 (  three) times daily.   Mariane Baumgarten Calcium (STOOL SOFTENER PO) Take 2 tablets by mouth daily.  . fluticasone (FLONASE) 50 MCG/ACT nasal spray Place 1 spray into both nostrils daily as needed for allergies.   . hydrochlorothiazide (HYDRODIURIL) 50 MG tablet Take 50 mg by mouth daily.  Marland Kitchen HYDROcodone-acetaminophen (NORCO/VICODIN) 5-325 MG tablet Take 1 tablet by mouth every 6 (six) hours as needed for moderate pain.   Marland Kitchen linaclotide (LINZESS) 145 MCG CAPS capsule Take 145 mcg by mouth daily before breakfast.  . lovastatin  (MEVACOR) 20 MG tablet Take 20 mg by mouth at bedtime.  . naproxen (NAPROSYN) 500 MG tablet Take 500 mg by mouth 2 (two) times daily as needed.  Marland Kitchen omeprazole (PRILOSEC) 20 MG capsule TAKE ONE CAPSULE BY MOUTH 30 MINUTES PRIOR TO FIRST MEAL OF THE DAY.  Marland Kitchen temazepam (RESTORIL) 15 MG capsule Take 15 mg by mouth at bedtime as needed for sleep.  Marland Kitchen topiramate (TOPAMAX) 50 MG tablet Take 50 mg by mouth daily.  . VESICARE 10 MG tablet Take 10 mg by mouth every evening.      Physical Exam Physical Exam BP 112/63   Pulse 95   Wt 189 lb (85.7 kg)   BMI 30.51 kg/m   Gen. appearance. The patient is well-developed and well-nourished, grooming and hygiene are normal. There are no gross congenital abnormalities  The patient is alert and oriented to person place and time  Mood and affect are normal  Ambulation altered by painful Achilles  Examination reveals the following: On inspection we find both feet have flatfeet left great toe bunion with tenderness and swelling in the retrocalcaneal bursa and tenderness over the Achilles tendon  With the range of motion of  right and left ankle remains normal but painful throughout the range of motion  Stability tests were normal  in the right left ankle  Strength tests revealed grade 5 motor strength in the Achilles tendon right and left  Skin we find no rash ulceration or erythema normal both feet and ankles  Sensation remains intact right and left foot and ankle  Impression vascular system shows no peripheral edema right and left ankle  Data Reviewed Left ankle 3 views. I interpreted this x-ray as Achilles tendinitis with swelling of the soft tissues in the retrocalcaneal bursa area with spur and large Haglund's calcaneal shape.DX   Assessment    Encounter Diagnoses  Name Primary?  . Acute left ankle pain   . Achilles tendinitis of both lower extremities Yes       Plan    Recommend switch to prednisone 10 mg twice a day stop  Naprosyn  Physical therapy for 6 weeks  Right tall cam walking boot for 6 weeks  Follow-up 6 weeks         Arther Abbott 05/16/2016, 10:15 AM

## 2016-05-16 NOTE — Patient Instructions (Signed)
   Your procedure is scheduled on: 05/22/2016  Report to [facility] at   6:15  AM.  Call this number if you have problems the morning of surgery: [phone]   Remember:   Do not drink or eat food:After Midnight.  :  Take these medicines the morning of surgery with A SIP OF WATER: Amlodipine-Benzepril, Klonopin, Oneprazole, Topamax, Flonase and Abilify     Do not bring valuables to the hospital.  Contacts, dentures or bridgework may not be worn into surgery.   Patients discharged the day of surgery will not be allowed to drive home.   Colonoscopy, Adult, Care After This sheet gives you information about how to care for yourself after your procedure. Your health care provider may also give you more specific instructions. If you have problems or questions, contact your health care provider. What can I expect after the procedure? After the procedure, it is common to have:  A small amount of blood in your stool for 24 hours after the procedure.  Some gas.  Mild abdominal cramping or bloating. Follow these instructions at home: General instructions  For the first 24 hours after the procedure:  Do not drive or use machinery.  Do not sign important documents.  Do not drink alcohol.  Do your regular daily activities at a slower pace than normal.  Eat soft, easy-to-digest foods.  Rest often.  Take over-the-counter or prescription medicines only as told by your health care provider.  It is up to you to get the results of your procedure. Ask your health care provider, or the department performing the procedure, when your results will be ready. Relieving cramping and bloating  Try walking around when you have cramps or feel bloated.  Apply heat to your abdomen as told by your health care provider. Use a heat source that your health care provider recommends, such as a moist heat pack or a heating pad.  Place a towel between your skin and the heat source.  Leave the heat on for  20-30 minutes.  Remove the heat if your skin turns bright red. This is especially important if you are unable to feel pain, heat, or cold. You may have a greater risk of getting burned. Eating and drinking  Drink enough fluid to keep your urine clear or pale yellow.  Resume your normal diet as instructed by your health care provider. Avoid heavy or fried foods that are hard to digest.  Avoid drinking alcohol for as long as instructed by your health care provider. Contact a health care provider if:  You have blood in your stool 2-3 days after the procedure. Get help right away if:  You have more than a small spotting of blood in your stool.  You pass large blood clots in your stool.  Your abdomen is swollen.  You have nausea or vomiting.  You have a fever.  You have increasing abdominal pain that is not relieved with medicine. This information is not intended to replace advice given to you by your health care provider. Make sure you discuss any questions you have with your health care provider. Document Released: 01/18/2004 Document Revised: 02/28/2016 Document Reviewed: 08/17/2015 Elsevier Interactive Patient Education  2017 [employer].

## 2016-05-16 NOTE — Addendum Note (Signed)
Addended by: Baldomero Lamy B on: 05/16/2016 10:57 AM   Modules accepted: Orders

## 2016-05-16 NOTE — Patient Instructions (Addendum)
STOP NAPROSYN  THERAPY DEPT WILL CALL YOU   WEAR BOOT RIGHT FOOT X 6 WEEKS    Achilles Tendinitis Achilles tendinitis is inflammation of the tough, cord-like band that attaches the lower muscles of your leg to your heel (Achilles tendon). It is usually caused by overusing the tendon and joint involved.  CAUSES Achilles tendinitis can happen because of:  A sudden increase in exercise or activity (such as running).  Doing the same exercises or activities (such as jumping) over and over.  Not warming up calf muscles before exercising.  Exercising in shoes that are worn out or not made for exercise.  Having arthritis or a bone growth on the back of the heel bone. This can rub against the tendon and hurt the tendon. SIGNS AND SYMPTOMS The most common symptoms are:  Pain in the back of the leg, just above the heel. The pain usually gets worse with exercise and better with rest.  Stiffness or soreness in the back of the leg, especially in the morning.  Swelling of the skin over the Achilles tendon.  Trouble standing on tiptoe. Sometimes, an Achilles tendon tears (ruptures). Symptoms of an Achilles tendon rupture can include:  Sudden, severe pain in the back of the leg.  Trouble putting weight on the foot or walking normally. DIAGNOSIS Achilles tendinitis will be diagnosed based on symptoms and a physical examination. An X-ray may be done to check if another condition is causing your symptoms. An MRI may be ordered if your health care provider suspects you may have completely torn your tendon, which is called an Achilles tendon rupture.  TREATMENT  Achilles tendinitis usually gets better over time. It can take weeks to months to heal completely. Treatment focuses on treating the symptoms and helping the injury heal. HOME CARE INSTRUCTIONS   Rest your Achilles tendon and avoid activities that cause pain.  Apply ice to the injured area:  Put ice in a plastic bag.  Place a towel  between your skin and the bag.  Leave the ice on for 20 minutes, 2-3 times a day  Try to avoid using the tendon (other than gentle range of motion) while the tendon is painful. Do not resume use until instructed by your health care provider. Then begin use gradually. Do not increase use to the point of pain. If pain does develop, decrease use and continue the above measures. Gradually increase activities that do not cause discomfort until you achieve normal use.  Do exercises to make your calf muscles stronger and more flexible. Your health care provider or physical therapist can recommend exercises for you to do.  Wrap your ankle with an elastic bandage or other wrap. This can help keep your tendon from moving too much. Your health care provider will show you how to wrap your ankle correctly.  Only take over-the-counter or prescription medicines for pain, discomfort, or fever as directed by your health care provider. SEEK MEDICAL CARE IF:   Your pain and swelling increase or pain is uncontrolled with medicines.  You develop new, unexplained symptoms or your symptoms get worse.  You are unable to move your toes or foot.  You develop warmth and swelling in your foot.  You have an unexplained temperature. MAKE SURE YOU:   Understand these instructions.  Will watch your condition.  Will get help right away if you are not doing well or get worse. This information is not intended to replace advice given to you by your health care  provider. Make sure you discuss any questions you have with your health care provider. Document Released: 03/15/2005 Document Revised: 06/26/2014 Document Reviewed: 01/15/2013 Elsevier Interactive Patient Education  2017 [employer].

## 2016-05-18 ENCOUNTER — Encounter (HOSPITAL_COMMUNITY): Payer: Self-pay

## 2016-05-18 ENCOUNTER — Other Ambulatory Visit: Payer: Self-pay

## 2016-05-18 ENCOUNTER — Encounter (HOSPITAL_COMMUNITY)
Admission: RE | Admit: 2016-05-18 | Discharge: 2016-05-18 | Disposition: A | Discharge status: Home / Self Care | Payer: Medicare Other | Source: Ambulatory Visit | Attending: Internal Medicine | Admitting: Internal Medicine

## 2016-05-18 DIAGNOSIS — Z01812 Encounter for preprocedural laboratory examination: Secondary | ICD-10-CM | POA: Insufficient documentation

## 2016-05-18 DIAGNOSIS — Z01810 Encounter for preprocedural cardiovascular examination: Secondary | ICD-10-CM | POA: Diagnosis present

## 2016-05-18 LAB — COMPREHENSIVE METABOLIC PANEL
ALT: 20 U/L (ref 14–54)
AST: 20 U/L (ref 15–41)
Albumin: 4.7 g/dL (ref 3.5–5.0)
Alkaline Phosphatase: 74 U/L (ref 38–126)
Anion gap: 9 (ref 5–15)
BUN: 12 mg/dL (ref 6–20)
CHLORIDE: 103 mmol/L (ref 101–111)
CO2: 29 mmol/L (ref 22–32)
CREATININE: 0.66 mg/dL (ref 0.44–1.00)
Calcium: 10.3 mg/dL (ref 8.9–10.3)
Glucose, Bld: 103 mg/dL — ABNORMAL HIGH (ref 65–99)
POTASSIUM: 3 mmol/L — AB (ref 3.5–5.1)
Sodium: 141 mmol/L (ref 135–145)
Total Bilirubin: 0.5 mg/dL (ref 0.3–1.2)
Total Protein: 8.5 g/dL — ABNORMAL HIGH (ref 6.5–8.1)

## 2016-05-18 LAB — CBC
HCT: 42 % (ref 36.0–46.0)
Hemoglobin: 14.7 g/dL (ref 12.0–15.0)
MCH: 30.6 pg (ref 26.0–34.0)
MCHC: 35 g/dL (ref 30.0–36.0)
MCV: 87.3 fL (ref 78.0–100.0)
PLATELETS: 206 10*3/uL (ref 150–400)
RBC: 4.81 MIL/uL (ref 3.87–5.11)
RDW: 14.6 % (ref 11.5–15.5)
WBC: 7.3 10*3/uL (ref 4.0–10.5)

## 2016-05-18 LAB — SURGICAL PCR SCREEN
MRSA, PCR: NEGATIVE
STAPHYLOCOCCUS AUREUS: NEGATIVE

## 2016-05-22 ENCOUNTER — Ambulatory Visit (HOSPITAL_COMMUNITY)
Admission: RE | Admit: 2016-05-22 | Discharge: 2016-05-22 | Disposition: A | Discharge status: Home / Self Care | Payer: Medicare Other | Source: Ambulatory Visit | Attending: Internal Medicine | Admitting: Internal Medicine

## 2016-05-22 ENCOUNTER — Other Ambulatory Visit: Payer: Self-pay

## 2016-05-22 ENCOUNTER — Ambulatory Visit (HOSPITAL_COMMUNITY): Payer: Medicare Other | Admitting: Anesthesiology

## 2016-05-22 ENCOUNTER — Encounter (HOSPITAL_COMMUNITY): Payer: Self-pay

## 2016-05-22 ENCOUNTER — Telehealth: Payer: Self-pay

## 2016-05-22 ENCOUNTER — Encounter (HOSPITAL_COMMUNITY)
Admission: RE | Disposition: A | Discharge status: Home / Self Care | Payer: Self-pay | Source: Ambulatory Visit | Attending: Internal Medicine

## 2016-05-22 DIAGNOSIS — F17210 Nicotine dependence, cigarettes, uncomplicated: Secondary | ICD-10-CM | POA: Insufficient documentation

## 2016-05-22 DIAGNOSIS — Z9889 Other specified postprocedural states: Secondary | ICD-10-CM | POA: Diagnosis not present

## 2016-05-22 DIAGNOSIS — I10 Essential (primary) hypertension: Secondary | ICD-10-CM | POA: Insufficient documentation

## 2016-05-22 DIAGNOSIS — K5730 Diverticulosis of large intestine without perforation or abscess without bleeding: Secondary | ICD-10-CM | POA: Insufficient documentation

## 2016-05-22 DIAGNOSIS — K5900 Constipation, unspecified: Secondary | ICD-10-CM | POA: Insufficient documentation

## 2016-05-22 DIAGNOSIS — Z85038 Personal history of other malignant neoplasm of large intestine: Secondary | ICD-10-CM | POA: Insufficient documentation

## 2016-05-22 DIAGNOSIS — G43909 Migraine, unspecified, not intractable, without status migrainosus: Secondary | ICD-10-CM | POA: Insufficient documentation

## 2016-05-22 DIAGNOSIS — Z79899 Other long term (current) drug therapy: Secondary | ICD-10-CM | POA: Insufficient documentation

## 2016-05-22 DIAGNOSIS — Z9049 Acquired absence of other specified parts of digestive tract: Secondary | ICD-10-CM | POA: Insufficient documentation

## 2016-05-22 DIAGNOSIS — K635 Polyp of colon: Secondary | ICD-10-CM | POA: Diagnosis not present

## 2016-05-22 DIAGNOSIS — I700 Atherosclerosis of aorta: Secondary | ICD-10-CM | POA: Insufficient documentation

## 2016-05-22 DIAGNOSIS — Z980 Intestinal bypass and anastomosis status: Secondary | ICD-10-CM

## 2016-05-22 DIAGNOSIS — R933 Abnormal findings on diagnostic imaging of other parts of digestive tract: Secondary | ICD-10-CM | POA: Diagnosis present

## 2016-05-22 DIAGNOSIS — D122 Benign neoplasm of ascending colon: Secondary | ICD-10-CM | POA: Diagnosis not present

## 2016-05-22 DIAGNOSIS — Z7951 Long term (current) use of inhaled steroids: Secondary | ICD-10-CM | POA: Diagnosis not present

## 2016-05-22 DIAGNOSIS — N281 Cyst of kidney, acquired: Secondary | ICD-10-CM | POA: Diagnosis not present

## 2016-05-22 DIAGNOSIS — Z800 Family history of malignant neoplasm of digestive organs: Secondary | ICD-10-CM | POA: Insufficient documentation

## 2016-05-22 DIAGNOSIS — K219 Gastro-esophageal reflux disease without esophagitis: Secondary | ICD-10-CM | POA: Insufficient documentation

## 2016-05-22 DIAGNOSIS — F319 Bipolar disorder, unspecified: Secondary | ICD-10-CM | POA: Diagnosis not present

## 2016-05-22 DIAGNOSIS — K449 Diaphragmatic hernia without obstruction or gangrene: Secondary | ICD-10-CM | POA: Diagnosis not present

## 2016-05-22 DIAGNOSIS — D124 Benign neoplasm of descending colon: Secondary | ICD-10-CM | POA: Insufficient documentation

## 2016-05-22 DIAGNOSIS — Z6830 Body mass index (BMI) 30.0-30.9, adult: Secondary | ICD-10-CM | POA: Insufficient documentation

## 2016-05-22 DIAGNOSIS — Z86010 Personal history of colonic polyps: Secondary | ICD-10-CM | POA: Insufficient documentation

## 2016-05-22 DIAGNOSIS — Z90710 Acquired absence of both cervix and uterus: Secondary | ICD-10-CM | POA: Insufficient documentation

## 2016-05-22 DIAGNOSIS — E7800 Pure hypercholesterolemia, unspecified: Secondary | ICD-10-CM | POA: Diagnosis not present

## 2016-05-22 DIAGNOSIS — E669 Obesity, unspecified: Secondary | ICD-10-CM | POA: Insufficient documentation

## 2016-05-22 DIAGNOSIS — Z85048 Personal history of other malignant neoplasm of rectum, rectosigmoid junction, and anus: Secondary | ICD-10-CM

## 2016-05-22 HISTORY — PX: COLONOSCOPY WITH PROPOFOL: SHX5780

## 2016-05-22 SURGERY — COLONOSCOPY WITH PROPOFOL
Anesthesia: Monitor Anesthesia Care

## 2016-05-22 MED ORDER — LIDOCAINE HCL (PF) 1 % IJ SOLN
INTRAMUSCULAR | Status: AC
  Filled 2016-05-22: qty 5

## 2016-05-22 MED ORDER — FENTANYL CITRATE (PF) 100 MCG/2ML IJ SOLN
INTRAMUSCULAR | Status: AC
  Filled 2016-05-22: qty 2

## 2016-05-22 MED ORDER — CHLORHEXIDINE GLUCONATE CLOTH 2 % EX PADS
6.0000 | MEDICATED_PAD | Freq: Once | CUTANEOUS | Status: AC
  Administered 2016-05-22: 6 via TOPICAL

## 2016-05-22 MED ORDER — PROPOFOL 10 MG/ML IV BOLUS
INTRAVENOUS | Status: AC
  Filled 2016-05-22: qty 40

## 2016-05-22 MED ORDER — CHLORHEXIDINE GLUCONATE CLOTH 2 % EX PADS: 6.0000 | MEDICATED_PAD | Freq: Once | CUTANEOUS | Status: DC

## 2016-05-22 MED ORDER — PROPOFOL 500 MG/50ML IV EMUL
INTRAVENOUS | Status: DC | PRN
  Administered 2016-05-22: 75 ug/kg/min via INTRAVENOUS

## 2016-05-22 MED ORDER — FENTANYL CITRATE (PF) 100 MCG/2ML IJ SOLN
25.0000 ug | INTRAMUSCULAR | Status: AC | PRN
  Administered 2016-05-22 (×2): 25 ug via INTRAVENOUS

## 2016-05-22 MED ORDER — ONDANSETRON HCL 4 MG/2ML IJ SOLN
4.0000 mg | Freq: Once | INTRAMUSCULAR | Status: AC
  Administered 2016-05-22: 4 mg via INTRAVENOUS

## 2016-05-22 MED ORDER — MIDAZOLAM HCL 2 MG/2ML IJ SOLN
1.0000 mg | INTRAMUSCULAR | Status: DC | PRN
  Administered 2016-05-22 (×2): 2 mg via INTRAVENOUS
  Filled 2016-05-22: qty 2

## 2016-05-22 MED ORDER — LACTATED RINGERS IV SOLN
INTRAVENOUS | Status: DC
  Administered 2016-05-22: 07:00:00 via INTRAVENOUS

## 2016-05-22 MED ORDER — MIDAZOLAM HCL 2 MG/2ML IJ SOLN
INTRAMUSCULAR | Status: AC
  Filled 2016-05-22: qty 2

## 2016-05-22 MED ORDER — LIDOCAINE HCL (CARDIAC) 10 MG/ML IV SOLN
INTRAVENOUS | Status: DC | PRN
Start: 2016-05-22 — End: 2016-05-22
  Administered 2016-05-22: 50 mg via INTRAVENOUS

## 2016-05-22 MED ORDER — ONDANSETRON HCL 4 MG/2ML IJ SOLN
INTRAMUSCULAR | Status: AC
  Filled 2016-05-22: qty 2

## 2016-05-22 MED ORDER — PROPOFOL 10 MG/ML IV BOLUS
INTRAVENOUS | Status: AC
  Filled 2016-05-22: qty 60

## 2016-05-22 MED ORDER — PROPOFOL 10 MG/ML IV BOLUS
INTRAVENOUS | Status: DC | PRN
  Administered 2016-05-22: 20 mg via INTRAVENOUS
  Administered 2016-05-22: 10 mg via INTRAVENOUS

## 2016-05-22 NOTE — Transfer of Care (Signed)
Immediate Anesthesia Transfer of Care Note  Patient: Ashley Patrick  Procedure(s) Performed: Procedure(s) with comments: COLONOSCOPY WITH PROPOFOL (N/A) - 7:30 am  Patient Location: PACU  Anesthesia Type:MAC  Level of Consciousness: awake  Airway & Oxygen Therapy: Patient Spontanous Breathing  Post-op Assessment: Report given to RN and Post -op Vital signs reviewed and stable  Post vital signs: Reviewed and stable  Last Vitals:  Vitals:   05/22/16 0720 05/22/16 0725  BP: 118/69   Pulse:    Resp: 18 20  Temp:      Last Pain:  Vitals:   05/22/16 0629  TempSrc: Oral  PainSc: 4       Patients Stated Pain Goal: 5 ([id number])  Complications: No apparent anesthesia complications

## 2016-05-22 NOTE — Anesthesia Preprocedure Evaluation (Signed)
Anesthesia Evaluation  Patient identified by MRN, date of birth, ID band Patient awake    Reviewed: Allergy & Precautions, H&P , NPO status , Patient's Chart, lab work & pertinent test results  History of Anesthesia Complications (+) PONV and history of anesthetic complications  Airway Mallampati: I  TM Distance: >3 FB     Dental  (+) Teeth Intact   Pulmonary sleep apnea , Current Smoker, former smoker,    breath sounds clear to auscultation       Cardiovascular hypertension, Pt. on medications  Rhythm:Regular Rate:Normal     Neuro/Psych  Headaches, PSYCHIATRIC DISORDERS Depression Bipolar Disorder    GI/Hepatic hiatal hernia, neg GERD  ,  Endo/Other    Renal/GU      Musculoskeletal   Abdominal   Peds  Hematology   Anesthesia Other Findings   Reproductive/Obstetrics                             Anesthesia Physical Anesthesia Plan  ASA: III  Anesthesia Plan: MAC   Post-op Pain Management:    Induction: Intravenous  Airway Management Planned: Simple Face Mask  Additional Equipment:   Intra-op Plan:   Post-operative Plan:   Informed Consent: I have reviewed the patients History and Physical, chart, labs and discussed the procedure including the risks, benefits and alternatives for the proposed anesthesia with the patient or authorized representative who has indicated his/her understanding and acceptance.     Plan Discussed with:   Anesthesia Plan Comments:         Anesthesia Quick Evaluation

## 2016-05-22 NOTE — Interval H&P Note (Signed)
History and Physical Interval Note:  05/22/2016 7:24 AM  Ashley Patrick  has presented today for surgery, with the diagnosis of abnormal colon on CT, history of CRC  The various methods of treatment have been discussed with the patient and family. After consideration of risks, benefits and other options for treatment, the patient has consented to  Procedure(s) with comments: COLONOSCOPY WITH PROPOFOL (N/A) - 7:30 am as a surgical intervention .  The patient's history has been reviewed, patient examined, no change in status, stable for surgery.  I have reviewed the patient's chart and labs.  Questions were answered to the patient's satisfaction.      No change; protruding hemorrhoids and bleeding with straining.  TCS per plan. The risks, benefits, limitations, alternatives and imponderables have been reviewed with the patient. Questions have been answered. All parties are agreeable.   Manus Rudd

## 2016-05-22 NOTE — Discharge Instructions (Signed)
Repeat colonoscopy in 5 years  Diverticulosis information provided  Continue current bowel regimen  Add Benefiber 1 tablespoon twice daily  Air contrast barium enema no sooner than 3 weeks from now to evaluate narrowing of intestine at the area of previous surgery.  Office visit with [place] in 3 months. The office will call you.  Colonoscopy Discharge Instructions  Read the instructions outlined below and refer to this sheet in the next few weeks. These discharge instructions provide you with general information on caring for yourself after you leave the hospital. Your doctor may also give you specific instructions. While your treatment has been planned according to the most current medical practices available, unavoidable complications occasionally occur. If you have any problems or questions after discharge, call Dr. Gala Patrick at [phone]. ACTIVITY  You may resume your regular activity, but move at a slower pace for the next 24 hours.   Take frequent rest periods for the next 24 hours.   Walking will help get rid of the air and reduce the bloated feeling in your belly (abdomen).   No driving for 24 hours (because of the medicine (anesthesia) used during the test).    Do not sign any important legal documents or operate any machinery for 24 hours (because of the anesthesia used during the test).  NUTRITION  Drink plenty of fluids.   You may resume your normal diet as instructed by your doctor.   Begin with a light meal and progress to your normal diet. Heavy or fried foods are harder to digest and may make you feel sick to your stomach (nauseated).   Avoid alcoholic beverages for 24 hours or as instructed.  MEDICATIONS  You may resume your normal medications unless your doctor tells you otherwise.  WHAT YOU CAN EXPECT TODAY  Some feelings of bloating in the abdomen.   Passage of more gas than usual.   Spotting of blood in your stool or on the toilet paper.  IF YOU HAD POLYPS  REMOVED DURING THE COLONOSCOPY:  No aspirin products for 7 days or as instructed.   No alcohol for 7 days or as instructed.   Eat a soft diet for the next 24 hours.  FINDING OUT THE RESULTS OF YOUR TEST Not all test results are available during your visit. If your test results are not back during the visit, make an appointment with your caregiver to find out the results. Do not assume everything is normal if you have not heard from your caregiver or the medical facility. It is important for you to follow up on all of your test results.  SEEK IMMEDIATE MEDICAL ATTENTION IF:  You have more than a spotting of blood in your stool.   Your belly is swollen (abdominal distention).   You are nauseated or vomiting.   You have a temperature over 101.   You have abdominal pain or discomfort that is severe or gets worse throughout the day.    Diverticulosis Diverticulosis is the condition that develops when small pouches (diverticula) form in the wall of your colon. Your colon, or large intestine, is where water is absorbed and stool is formed. The pouches form when the inside layer of your colon pushes through weak spots in the outer layers of your colon. CAUSES  No one knows exactly what causes diverticulosis. RISK FACTORS  Being older than 73. Your risk for this condition increases with age. Diverticulosis is rare in people younger than 40 years. By age 26, almost everyone has  it.  Eating a low-fiber diet.  Being frequently constipated.  Being overweight.  Not getting enough exercise.  Smoking.  Taking over-the-counter pain medicines, like aspirin and ibuprofen. SYMPTOMS  Most people with diverticulosis do not have symptoms. DIAGNOSIS  Because diverticulosis often has no symptoms, health care providers often discover the condition during an exam for other colon problems. In many cases, a health care provider will diagnose diverticulosis while using a flexible scope to examine the  colon (colonoscopy). TREATMENT  If you have never developed an infection related to diverticulosis, you may not need treatment. If you have had an infection before, treatment may include:  Eating more fruits, vegetables, and grains.  Taking a fiber supplement.  Taking a live bacteria supplement (probiotic).  Taking medicine to relax your colon. HOME CARE INSTRUCTIONS   Drink at least 6-8 glasses of water each day to prevent constipation.  Try not to strain when you have a bowel movement.  Keep all follow-up appointments. If you have had an infection before:  Increase the fiber in your diet as directed by your health care provider or dietitian.  Take a dietary fiber supplement if your health care provider approves.  Only take medicines as directed by your health care provider. SEEK MEDICAL CARE IF:   You have abdominal pain.  You have bloating.  You have cramps.  You have not gone to the bathroom in 3 days. SEEK IMMEDIATE MEDICAL CARE IF:   Your pain gets worse.  Yourbloating becomes very bad.  You have a fever or chills, and your symptoms suddenly get worse.  You begin vomiting.  You have bowel movements that are bloody or black. MAKE SURE YOU:  Understand these instructions.  Will watch your condition.  Will get help right away if you are not doing well or get worse. This information is not intended to replace advice given to you by your health care provider. Make sure you discuss any questions you have with your health care provider. Document Released: 03/02/2004 Document Revised: 06/10/2013 Document Reviewed: 04/30/2013 Elsevier Interactive Patient Education  2017 [employer].

## 2016-05-22 NOTE — Op Note (Signed)
[facility] Patient Name: Ashley Patrick Procedure Date: 05/22/2016 7:34 AM MRN: [id number] Date of Birth: July 21, 1955 Attending MD: Norvel Richards , MD CSN: [id number] Age: 60 Admit Type: Outpatient Procedure:                Colonoscopy Indications:              High risk colon cancer surveillance: Personal                            history of colon cancer; constipation.; Abnormal at                            the colonic anastomosis Providers:                Norvel Richards, MD, Hinton Rao, RN, Sherlyn Lees, Technician Referring MD:              Medicines:                Propofol per Anesthesia, Meperidine mg IV Complications:            No immediate complications. Estimated Blood Loss:     Estimated blood loss was minimal. Procedure:                Pre-Anesthesia Assessment:                           - Prior to the procedure, a History and Physical                            was performed, and patient medications and                            allergies were reviewed. The patient's tolerance of                            previous anesthesia was also reviewed. The risks                            and benefits of the procedure and the sedation                            options and risks were discussed with the patient.                            All questions were answered, and informed consent                            was obtained. Prior Anticoagulants: The patient has                            taken no previous anticoagulant or antiplatelet  agents. ASA Grade Assessment: III - A patient with                            severe systemic disease. After reviewing the risks                            and benefits, the patient was deemed in                            satisfactory condition to undergo the procedure.                           After obtaining informed consent, the colonoscope      was passed under direct vision. Throughout the                            procedure, the patient's blood pressure, pulse, and                            oxygen saturations were monitored continuously. The                            Colonoscope was introduced through the and advanced                            to the the cecum, identified by appendiceal orifice                            and ileocecal valve. The colonoscopy was performed                            without difficulty. The patient tolerated the                            procedure well. The quality of the bowel                            preparation was adequate. The ileocecal valve,                            appendiceal orifice, and rectum were photographed.                            The entire colon was well visualized. Scope In: 7:40:58 AM Scope Out: [id number] AM Scope Withdrawal Time: 0 hours 12 minutes 1 second  Total Procedure Duration: 0 hours 15 minutes 43 seconds  Findings:      The perianal and digital rectal examinations were normal.      Two semi-pedunculated polyps were found in the descending colon and       ascending colon. The polyps were 3 to 5 mm in size. These polyps were       removed with a cold snare. Resection complete?"1 retrieved, one not.       Estimated blood loss was minimal.      There  was evidence of a prior surgical anastomosis in the sigmoid colon.       This was patent. limited somewhat narrowedjust at the anastomosis going       proximally. No evidence of tumor or infiltrating proces; mucosa and       lumen appeared fine on the way out CT findings noted.      Scattered small-mouthed diverticula were found in the entire colon.      The exam was otherwise without abnormality on direct and retroflexion       views. Impression:               - Two 3 to 5 mm polyps in the descending colon and                            in the ascending colon, removed with a cold snare.                             Resected                           - Patent surgical anastomosis.                           - Diverticulosis in the entire examined colon.                           - The examination was otherwise normal on direct                            and retroflexion views. Moderate Sedation:      Moderate (conscious) sedation was personally administered by an       anesthesia professional. The following parameters were monitored: oxygen       saturation, heart rate, blood pressure, respiratory rate, EKG, adequacy       of pulmonary ventilation, and response to care. Total physician       intraservice time was 24 minutes. Recommendation:           - Patient has a contact number available for                            emergencies. The signs and symptoms of potential                            delayed complications were discussed with the                            patient. Return to normal activities tomorrow.                            Written discharge instructions were provided to the                            patient.                           - Resume previous diet.                           -  Continue present medications.                           - Repeat colonoscopy date to be determined after                            pending pathology results are reviewed for                            surveillance based on pathology results.                           - Return to GI clinic after studies are complete.                            Continue bowel regimen. Add Benafiber. ACBE to                            further evaluate anastomosis no sooner than 3 weeks                            from now. Procedure Code(s):        --- Professional ---                           [phone], Colonoscopy, flexible; with removal of                            tumor(s), polyp(s), or other lesion(s) by snare                            technique Diagnosis Code(s):        --- Professional ---                            [id number], Personal history of other malignant                            neoplasm of large intestine                           D12.4, Benign neoplasm of descending colon                           D12.2, Benign neoplasm of ascending colon                           Z98.0, Intestinal bypass and anastomosis status                           K57.30, Diverticulosis of large intestine without                            perforation or abscess without bleeding CPT copyright 2016 American Medical Association. All rights reserved. The codes documented in this report are preliminary and upon coder review may  be revised to meet current compliance requirements. Cristopher Estimable. Dannika Hilgeman, MD Norvel Richards, MD 05/22/2016 8:14:52 AM This report has been signed electronically. Number of Addenda: 0

## 2016-05-22 NOTE — Anesthesia Postprocedure Evaluation (Signed)
Anesthesia Post Note  Patient: Ashley Patrick  Procedure(s) Performed: Procedure(s) (LRB): COLONOSCOPY WITH PROPOFOL (N/A)  Patient location during evaluation: PACU Anesthesia Type: MAC Level of consciousness: awake Pain management: satisfactory to patient Vital Signs Assessment: post-procedure vital signs reviewed and stable Respiratory status: spontaneous breathing Cardiovascular status: stable Anesthetic complications: no    Last Vitals:  Vitals:   05/22/16 0825 05/22/16 0834  BP:  121/72  Pulse: 69 76  Resp: 15 16  Temp:  36.8 C    Last Pain:  Vitals:   05/22/16 0834  TempSrc: Oral  PainSc:                  Drucie Opitz

## 2016-05-22 NOTE — Telephone Encounter (Signed)
ACBE scheduled for 06/15/16 at 10:00am, arrive at 9:45am at [facility]. Called and informed pt. Informed pt of instructions for barium enema and mailed to pt.

## 2016-05-22 NOTE — Anesthesia Procedure Notes (Signed)
Procedure Name: MAC Date/Time: 05/22/2016 7:29 AM Performed by: Vista Deck Pre-anesthesia Checklist: Patient identified, Emergency Drugs available, Suction available, Timeout performed and Patient being monitored Patient Re-evaluated:Patient Re-evaluated prior to inductionOxygen Delivery Method: Non-rebreather mask

## 2016-05-22 NOTE — H&P (View-Only) (Signed)
Primary Care Physician: Lanette Hampshire, MD  Primary Gastroenterologist:  Garfield Cornea, MD   Chief Complaint  Patient presents with  . Follow-up  . Constipation    HPI: Ashley Patrick is a 60 y.o. female here for follow-up. She has a history of GERD, colon cancer. 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 She had a partial colectomy January 2015.  Tumor staged at T2, N0, M0. Seen by oncology 07/21/13 with no additional treatment indicated, and colonoscopy 1 year and if no polyps repeat every 3 years. No follow-up appt with oncology made.  She had a colonoscopy February 2016 patient with redundant and elongated colon requiring multiple position changes and external pressure to reach the cecum. A 25 cm, surgical anastomosis was identified. It was 8 mm pedunculated multilobulated polyp at this level with an adjacent diminutive polyp. At the splenic flexure there were 2 more 5 mm polyps and there was one in the descending colon as well. Pathology revealed tubular adenomas. She was advised to have a repeat colonoscopy in 2 years.  Patient reports increasing difficulty having a BM. Recently started on Linzess 145 g daily per PCP. She states she cannot take it daily because it causes significant loose stools. She takes it every 3 days and is well-tolerated that way. Generally has a bowel movement every day. Prior to [place] she was having significant difficulty going to the bathroom. Would have to strain, hemorrhoids come out, bright red blood per rectum. She does have lower abdominal pain as well as chronic upper abdominal pain. Reports good appetite. She reports 20 pound weight loss which is well-documented over the past one year however. She states she eats a whole lot. Recent thyroid function normal. Hemoglobin A1c normal. Denies heartburn. No  dysphagia. No vomiting. Denies NSAID use/aspirin powder use. No longer on anti-inflammatories. EGD done in February 2016, minimal retained gastric contents that exam was thorough. Small benign cystic-appearing lesion in the distal esophagus, small hiatal hernia.     Current Outpatient Prescriptions  Medication Sig Dispense Refill  . amLODipine-benazepril (LOTREL) 10-20 MG per capsule Take 1 capsule by mouth daily.     . ARIPiprazole (ABILIFY) 15 MG tablet Take 15 mg by mouth every evening.     . Cholecalciferol (VITAMIN D3) 2000 UNITS TABS Take 1 tablet by mouth daily.    . clonazePAM (KLONOPIN) 1 MG tablet Take 1 mg by mouth 3 (three) times daily.     . fluticasone (FLONASE) 50 MCG/ACT nasal spray Place 1-2 sprays into both nostrils daily as needed for allergies.     . hydrochlorothiazide (HYDRODIURIL) 50 MG tablet Take 50 mg by mouth daily.    Marland Kitchen linaclotide (LINZESS) 145 MCG CAPS capsule Take 145 mcg by mouth daily before breakfast.    . lovastatin (MEVACOR) 20 MG tablet Take 20 mg by mouth at bedtime.    Marland Kitchen omeprazole (PRILOSEC) 20 MG capsule TAKE ONE CAPSULE BY MOUTH 30 MINUTES PRIOR TO FIRST MEAL OF THE DAY. 30 capsule 1  . temazepam (RESTORIL) 15 MG capsule Take 15 mg by mouth at bedtime.    . topiramate (TOPAMAX) 50 MG tablet Take 50 mg by mouth daily.    . VESICARE 10 MG tablet Take 10 mg by mouth every evening.     No current facility-administered medications for this visit.  Allergies as of 05/02/2016  . (No Known Allergies)   Past Medical History:  Diagnosis Date  . Back pain   . Bipolar 1 disorder ([place])   . Cancer [facility]) 2014   Colon Cancer  . Hypercholesteremia   . Hypertension   . Lateral epicondylitis  of elbow    right  . Migraine headache   . Nicotine addiction   . Obesity    History of  . OD (overdose of drug)    hospitalized for 11 days   . Psychosis    Past Surgical History:  Procedure Laterality Date  . ABDOMINAL HYSTERECTOMY    . BIOPSY N/A  08/13/2014   Procedure: BIOPSY;  Surgeon: Daneil Dolin, MD;  Location: [facility];  Service: Endoscopy;  Laterality: N/A;  . BREAST BIOPSY  10/18/2011   Procedure: BREAST BIOPSY;  Surgeon: Jamesetta So, MD;  Location: [facility];  Service: General;  Laterality: Left;  . COLON RESECTION N/A 06/23/2013   Procedure: HAND ASSISTED LAPAROSCOPIC PARTIAL COLECTOMY;  Surgeon: Jamesetta So, MD;  Location: [facility];  Service: General;  Laterality: N/A;  . COLONOSCOPY    . COLONOSCOPY N/A 05/29/2013   [id number] mass most likely representing colorectal cancer S/ P biospy.Multiple colonic and rectal polyps removed/treated as described above. Colonic diverticulosis  . COLONOSCOPY WITH PROPOFOL N/A 08/13/2014   RMR: Status post sigmoid colectomy. Multiple colonic polyps removed as described above. Redundant colon. Pan colonic diverticuloisi  . ESOPHAGEAL DILATION N/A 08/13/2014   Procedure: [place];  Surgeon: Daneil Dolin, MD;  Location: [facility];  Service: Endoscopy;  Laterality: N/A;  . ESOPHAGOGASTRODUODENOSCOPY N/A 12/26/2012   [id number] reflux esophagitis. Gastric and duodenal bulbar erosions-status post gastric biopsynegative H.pylori  . ESOPHAGOGASTRODUODENOSCOPY (EGD) WITH PROPOFOL N/A 08/13/2014   RMR: Small benign cystic-appearing lesion in distal esophagus of doubtful clinical significance, otherwise normal esophagus, status post [place] dilation. Small hiatal hernia, some retained gastric contents (query delayed gastric emptying.)  . partial hysterectomy    . POLYPECTOMY N/A 08/13/2014   Procedure: POLYPECTOMY;  Surgeon: Daneil Dolin, MD;  Location: [facility];  Service: Endoscopy;  Laterality: N/A;   Family History  Problem Relation Age of Onset  . Colon cancer Father     diagnosed in his 73s  . Anesthesia problems Neg Hx   . Hypotension Neg Hx   . Malignant hyperthermia Neg Hx   . Pseudochol deficiency Neg Hx    Social History   Social History  . Marital status:  Legally Separated    Spouse name: N/A  . Number of children: 3  . Years of education: N/A   Social History Main Topics  . Smoking status: Current Every Day Smoker    Packs/day: 0.50    Years: 38.00    Types: Cigarettes    Last attempt to quit: 06/19/2014  . Smokeless tobacco: Never Used     Comment: vapor only  . Alcohol use No  . Drug use: No  . Sexual activity: Yes    Birth control/ protection: Surgical   Other Topics Concern  . None   Social History Narrative  . None    ROS:  General: Negative for anorexia,fever, chills, fatigue, weakness.See history of present illness ENT: Negative for hoarseness, difficulty swallowing , nasal congestion. CV: Negative for chest pain, angina, palpitations, dyspnea on exertion, peripheral edema.  Respiratory: Negative for dyspnea at rest, dyspnea on exertion, cough, sputum, wheezing.  GI: See history of present illness. GU:  Negative  for dysuria, hematuria, urinary incontinence, urinary frequency, nocturnal urination.  Endo: See history of present illness.    Physical Examination:   BP 91/64   Pulse 95   Temp 97.2 F (36.2 C) (Oral)   Ht 5\' 6"  (1.676 m)   Wt 188 lb 9.6 oz (85.5 kg)   BMI 30.44 kg/m   General: Well-nourished, well-developed in no acute distress.  Eyes: No icterus. Mouth: Oropharyngeal mucosa moist and pink , no lesions erythema or exudate. Lungs: Clear to auscultation bilaterally.  Heart: Regular rate and rhythm, no murmurs rubs or gallops.  Abdomen: Bowel sounds are normal, mild epigastric and lower abdominal tenderness, nondistended, no hepatosplenomegaly or masses, no abdominal bruits or hernia , no rebound or guarding.   Extremities: No lower extremity edema. No clubbing or deformities. Neuro: Alert and oriented x 4   Skin: Warm and dry, no jaundice.   Psych: Alert and cooperative, normal mood and affect.  Labs:  Lab Results  Component Value Date   CREATININE 1.07 (H) 04/11/2016   BUN 23 (H) 04/11/2016     NA 139 04/11/2016   K 3.3 (L) 04/11/2016   CL 107 04/11/2016   CO2 26 04/11/2016   Lab Results  Component Value Date   WBC 5.7 03/29/2016   HGB 13.7 03/29/2016   HCT 39.1 03/29/2016   MCV 87.7 03/29/2016   PLT 182 03/29/2016   Lab Results  Component Value Date   ALT 17 02/29/2016   AST 16 02/29/2016   ALKPHOS 73 02/29/2016   BILITOT 0.6 02/29/2016   Lab Results  Component Value Date   TSH 1.655 02/29/2016   Lab Results  Component Value Date   HGBA1C 5.6 02/29/2016    Imaging Studies: US Venous Img Lower Unilateral Left  Result Date: 04/11/2016 CLINICAL DATA:  Acute left lower extremity pain for 3 days EXAM: LEFT LOWER EXTREMITY VENOUS DOPPLER ULTRASOUND TECHNIQUE: Gray-scale sonography with graded compression, as well as color Doppler and duplex ultrasound were performed to evaluate the lower extremity deep venous systems from the level of the common femoral vein and including the common femoral, femoral, profunda femoral, popliteal and calf veins including the posterior tibial, peroneal and gastrocnemius veins when visible. The superficial great saphenous vein was also interrogated. Spectral Doppler was utilized to evaluate flow at rest and with distal augmentation maneuvers in the common femoral, femoral and popliteal veins. COMPARISON:  None. FINDINGS: Contralateral Common Femoral Vein: Respiratory phasicity is normal and symmetric with the symptomatic side. No evidence of thrombus. Normal compressibility. Common Femoral Vein: No evidence of thrombus. Normal compressibility, respiratory phasicity and response to augmentation. Saphenofemoral Junction: No evidence of thrombus. Normal compressibility and flow on color Doppler imaging. Profunda Femoral Vein: No evidence of thrombus. Normal compressibility and flow on color Doppler imaging. Femoral Vein: No evidence of thrombus. Normal compressibility, respiratory phasicity and response to augmentation. Popliteal Vein: No evidence  of thrombus. Normal compressibility, respiratory phasicity and response to augmentation. Calf Veins: No evidence of thrombus. Normal compressibility and flow on color Doppler imaging. Superficial Great Saphenous Vein: No evidence of thrombus. Normal compressibility and flow on color Doppler imaging. Venous Reflux:  None. Other Findings:  None. IMPRESSION: No evidence of deep venous thrombosis. Electronically Signed   By: Jerilynn Mages.  Shick M.D.   On: 04/11/2016 16:31   CLINICAL DATA:  Subacute onset of constipation and cramping lower abdominal pain. Abdominal distention. Initial encounter.  EXAM: CT ABDOMEN AND PELVIS WITH CONTRAST  TECHNIQUE: Multidetector CT imaging of the abdomen and  pelvis was performed using the standard protocol following bolus administration of intravenous contrast.  CONTRAST:  158mL ISOVUE-300 IOPAMIDOL (ISOVUE-300) INJECTION 61%  COMPARISON:  CT of the abdomen and pelvis from 01/15/2014, and abdominal ultrasound performed 01/19/2015  FINDINGS: Lower chest: The visualized lung bases are grossly clear. The visualized portions of the mediastinum are unremarkable.  Hepatobiliary: Scattered hypodensities are noted within the liver, measuring up to 2.6 cm in size. These likely reflect cysts. The gallbladder is grossly unremarkable in appearance. The common bile duct remains normal in caliber.  Pancreas: The pancreas is within normal limits.  Spleen: The spleen is unremarkable in appearance.  Adrenals/Urinary Tract: The adrenal glands are unremarkable in appearance. Small right renal cysts are noted. There is no evidence of hydronephrosis. No renal or ureteral stones are identified. No perinephric stranding is seen.  Stomach/Bowel: The stomach is unremarkable in appearance. The small bowel is within normal limits. The appendix is normal in caliber, without evidence of appendicitis.  The colon is largely filled with fluid. A large amount of dense stool is  noted within the mid sigmoid colon, at the site of the patient's colonic anastomosis, causing the patient's constipation. The dense stool measures approximately 12 x 7 x 7 cm. This is approximately 18 cm above the external sphincter. The distal sigmoid colon is mostly empty.  Vascular/Lymphatic: Scattered calcification is seen along the abdominal aorta and its branches. The abdominal aorta is otherwise grossly unremarkable. The inferior vena cava is grossly unremarkable. No retroperitoneal lymphadenopathy is seen. No pelvic sidewall lymphadenopathy is identified.  Reproductive: The bladder is mildly distended and grossly unremarkable. The patient is status post partial hysterectomy. No suspicious adnexal masses are seen. The ovaries appear grossly symmetric.  Other: No additional soft tissue abnormalities are seen.  Musculoskeletal: No acute osseous abnormalities are identified. The visualized musculature is unremarkable in appearance.  IMPRESSION: 1. Large amount of dense stool noted at the mid sigmoid colon, at the site of the patient's colonic anastomosis, causing the patient's constipation. The dense stool measures approximate 12 x 7 x 7 cm. This is approximately 18 cm above the external sphincter. The distal sigmoid colon is mostly empty. 2. Large amount of fluid noted filling the remainder of the colon, proximal to the dense stool. 3. Scattered hypodensities within the liver, measuring up to 2.6 cm in size, likely reflect cysts. 4. Small right renal cysts seen. 5. Scattered aortic atherosclerosis noted. These results were called by telephone at the time of interpretation on 03/30/2016 at 1:59 am to Dr. Varney Biles, who verbally acknowledged these results.   Electronically Signed   By: Garald Balding M.D.   On: 03/30/2016 01:59

## 2016-05-26 ENCOUNTER — Encounter (HOSPITAL_COMMUNITY): Payer: Self-pay | Admitting: Internal Medicine

## 2016-05-29 ENCOUNTER — Encounter (HOSPITAL_COMMUNITY): Payer: Self-pay | Admitting: Physical Therapy

## 2016-05-29 ENCOUNTER — Ambulatory Visit (HOSPITAL_COMMUNITY): Payer: Medicare Other | Attending: Orthopedic Surgery | Admitting: Physical Therapy

## 2016-05-29 DIAGNOSIS — M79671 Pain in right foot: Secondary | ICD-10-CM | POA: Diagnosis present

## 2016-05-29 DIAGNOSIS — R2689 Other abnormalities of gait and mobility: Secondary | ICD-10-CM | POA: Insufficient documentation

## 2016-05-29 DIAGNOSIS — M25674 Stiffness of right foot, not elsewhere classified: Secondary | ICD-10-CM | POA: Diagnosis present

## 2016-05-29 DIAGNOSIS — M25675 Stiffness of left foot, not elsewhere classified: Secondary | ICD-10-CM | POA: Insufficient documentation

## 2016-05-29 DIAGNOSIS — M79672 Pain in left foot: Secondary | ICD-10-CM | POA: Insufficient documentation

## 2016-05-29 NOTE — Therapy (Signed)
[place], [place], [address] Phone: [phone]   Fax:  [phone]  Physical Therapy Evaluation  Patient Details  Name: Ashley Patrick MRN: [id number] Date of Birth: January 07, 1956 Referring Provider: Arther Abbott, MD  Encounter Date: 05/29/2016      PT End of Session - 05/29/16 1240    Visit Number 1   Number of Visits 17   Date for PT Re-Evaluation 06/27/16   Authorization Type U[employer] medicare   Authorization Time Period 05/29/16 to 07/24/16   PT Start Time 1035   PT Stop Time 1115   PT Time Calculation (min) 40 min   Activity Tolerance Patient limited by pain   Behavior During Therapy [facility] for tasks assessed/performed      Past Medical History:  Diagnosis Date  . Back pain   . Bipolar 1 disorder ([place])   . Cancer [facility]) 2014   Colon Cancer  . Hypercholesteremia   . Hypertension   . Lateral epicondylitis  of elbow    right  . Migraine headache   . Nicotine addiction   . Obesity    History of  . OD (overdose of drug)    hospitalized for 11 days   . Psychosis     Past Surgical History:  Procedure Laterality Date  . ABDOMINAL HYSTERECTOMY    . BIOPSY N/A 08/13/2014   Procedure: BIOPSY;  Surgeon: Daneil Dolin, MD;  Location: [facility];  Service: Endoscopy;  Laterality: N/A;  . BREAST BIOPSY  10/18/2011   Procedure: BREAST BIOPSY;  Surgeon: Jamesetta So, MD;  Location: [facility];  Service: General;  Laterality: Left;  . COLON RESECTION N/A 06/23/2013   Procedure: HAND ASSISTED LAPAROSCOPIC PARTIAL COLECTOMY;  Surgeon: Jamesetta So, MD;  Location: [facility];  Service: General;  Laterality: N/A;  . COLONOSCOPY    . COLONOSCOPY N/A 05/29/2013   [id number] mass most likely representing colorectal cancer S/ P biospy.Multiple colonic and rectal polyps removed/treated as described above. Colonic diverticulosis  . COLONOSCOPY WITH PROPOFOL N/A 08/13/2014   RMR: Status post sigmoid colectomy. Multiple colonic polyps  removed as described above. Redundant colon. Pan colonic diverticuloisi  . COLONOSCOPY WITH PROPOFOL N/A 05/22/2016   Procedure: COLONOSCOPY WITH PROPOFOL;  Surgeon: Daneil Dolin, MD;  Location: [facility];  Service: Endoscopy;  Laterality: N/A;  7:30 am  . ESOPHAGEAL DILATION N/A 08/13/2014   Procedure: [place];  Surgeon: Daneil Dolin, MD;  Location: [facility];  Service: Endoscopy;  Laterality: N/A;  . ESOPHAGOGASTRODUODENOSCOPY N/A 12/26/2012   [id number] reflux esophagitis. Gastric and duodenal bulbar erosions-status post gastric biopsynegative H.pylori  . ESOPHAGOGASTRODUODENOSCOPY (EGD) WITH PROPOFOL N/A 08/13/2014   RMR: Small benign cystic-appearing lesion in distal esophagus of doubtful clinical significance, otherwise normal esophagus, status post Maloney dilation. Small hiatal hernia, some retained gastric contents (query delayed gastric emptying.)  . partial hysterectomy    . POLYPECTOMY N/A 08/13/2014   Procedure: POLYPECTOMY;  Surgeon: Daneil Dolin, MD;  Location: [facility];  Service: Endoscopy;  Laterality: N/A;    There were no vitals filed for this visit.       Subjective Assessment - 05/29/16 1037    Subjective Pt reports that she initially had Rt ankle pain about 3 months ago and then her Lt ankle started up about 1 week after that. She was taking pain medication without relief, so she went to her PCP who referred her to Dr. Aline Brochure. He placed her  in a boot and instructed her to wear it all day. She currently wears it all day and removes it to sleep. She feels that it has been about the same since she started wearing the boot and in fact her pain is worse in the morning.    Pertinent History Back pain, bipolar disorder, HTN, obesity, psychosis   Limitations Walking;Standing   How long can you sit comfortably? unlimited    How long can you stand comfortably? 2.5 hours   How long can you walk comfortably? 30 minutes    Diagnostic tests Xray:  Haglund's type shape to the calcaneus. Calcification in the Achilles tendon insertion. Swelling of the soft tissues of the retro calcaneal bursa.   Patient Stated Goals decrease pain    Currently in Pain? Other (Comment)  None currently, 6/10 max pain: Rt>Lt             [facility] PT Assessment - 05/29/16 0001      Assessment   Medical Diagnosis B achilles tendonitis   Referring Provider Arther Abbott, MD   Onset Date/Surgical Date 02/28/16  approximately   Next MD Visit 06/27/16   Prior Therapy none      Precautions   Precaution Comments Lt cam boot (6 weeks per MD)      Balance Screen   Has the patient fallen in the past 6 months No   Has the patient had a decrease in activity level because of a fear of falling?  No   Is the patient reluctant to leave their home because of a fear of falling?  No     Prior Function   Level of Independence Independent   Vocation On disability   Leisure Art therapist, [employer] every morning (treadmill, bike, [employer])      Cognition   Overall Cognitive Status Within Functional Limits for tasks assessed     Observation/Other Assessments   Observations wearing Lt cam boot    Focus on Therapeutic Outcomes (FOTO)  59% limited      Sensation   Light Touch Appears Intact     Functional Tests   Functional tests Single leg stance     Single Leg Stance   Comments unable without UE assistance      Posture/Postural Control   Posture Comments Standing: B foot ER, pronation, Lt 1MTP bunion     ROM / Strength   AROM / PROM / Strength AROM;Strength     AROM   Overall AROM Comments Closed chain DF: Rt 21 deg, Lt: 24 deg   AROM Assessment Site Ankle   Right/Left Ankle Right;Left   Right Ankle Dorsiflexion 5  knee flexed: 10   Right Ankle Plantar Flexion 50   Right Ankle Inversion 40   Left Ankle Dorsiflexion 3  knee flexed: 12 deg   Left Ankle Plantar Flexion 40   Left Ankle Inversion 40     Strength   Strength Assessment Site Ankle   Right/Left  Ankle Right;Left   Right Ankle Dorsiflexion 4+/5   Right Ankle Plantar Flexion 2-/5   Left Ankle Dorsiflexion 4+/5   Left Ankle Plantar Flexion 2-/5     Palpation   Palpation comment TTP along B achilles tendons 1-2 cm above insertion     Transfers   Five time sit to stand comments  10 sec, poor closed chain DF     Ambulation/Gait   Ambulation/Gait Yes   Ambulation Distance (Feet) 50 Feet   Assistive device None   Gait Comments B foot  eversion (Rt>Lt at midstance), decreased push off, decreased stance time on Rt                    [facility] Adult PT Treatment/Exercise - 05/29/16 0001      Exercises   Exercises Ankle     Ankle Exercises: Seated   Other Seated Ankle Exercises arch lift x3 sec hold, x5 reps each  HEP demo      Ankle Exercises: Supine   Other Supine Ankle Exercises PF x10 reps   HEP demo                PT Education - 05/29/16 1239    Education provided Yes   Education Details eval findings/POC; importance of spending time out of the boot to maintain ankle ROM and intrinsic musculature strength; HEP initiated   Person(s) Educated Patient   Methods Explanation;Demonstration;Handout   Comprehension Verbalized understanding;Returned demonstration          PT Short Term Goals - 05/29/16 1526      PT SHORT TERM GOAL #1   Title Pt will demo consistency and independence with her HEP to improve ankle ROM and strength.    Time 2   Period Weeks   Status New     PT SHORT TERM GOAL #2   Title Pt will demo improved pain report to no greater than 4/10 to allow her to resume daily activity without significant discomfort.   Time 4   Period Weeks   Status New           PT Long Term Goals - 05/29/16 1528      PT LONG TERM GOAL #1   Title Pt will demo improved B ankle ROM to lacking no more than 10 degrees of DF to improve mechanics with ambulation.    Time 8   Period Weeks   Status New     PT LONG TERM GOAL #2   Title Pt will demo  improved ankle strength in inversion/DF/eversion to 5/5 MMT to improve safety with functional activity.   Time 8   Period Weeks   Status New     PT LONG TERM GOAL #3   Title Pt will demo improved gastroc strength to atleast 4/5 MMT to allow her to reach over head for objects at home.    Time 8   Period Weeks   Status New     PT LONG TERM GOAL #4   Title Pt will demo improved gait mechanics evident by her ability to ambulate with equal step length and without noted antalgic pattern, x190ft, to allow her to return to treadmill walking at the [facility] for overall fitness.     Time 8   Period Weeks   Status New     PT LONG TERM GOAL #5   Title Pt will demo improved balace and safety evident by her ability to maintain SLS on each LE for up to 10 sec without LOB, 3/5 trials.    Time 8   Period Weeks   Status New               Plan - 05/29/16 1241    Clinical Impression Statement Pt is a pleasant 60yo F referred to OPPT with onset of B achilles pain ~3-4 months ago. She arrives today wearing Cam boot on her LLE reporting increasing stiffness since about 2 weeks ago after being placed in the boot. She demonstrates tenderness with palpation along B proximal achilles tendons  as well as limited ankle ROM and strength which is impairing her ability to tolerate daily activity and impairing her balance as she was unable to perform SLS for any amount of time secondary to pain and unsteadiness. HEP was initiated with ankle ROM and strengthening exercises with pt able to return demonstration of correct technique. I also discussed the importance of spending time out of the boot to allow her to maintain what little ROM and strength she currently has. Pt would benefit from skilled PT to address her postural limitations as well as limitations in ankle ROM, strength and proprioception to improve her quality of life and tolerance of daily activity.   Rehab Potential Good   PT Frequency 2x / week   PT  Duration 8 weeks   PT Treatment/Interventions ADLs/Self Care Home Management;Cryotherapy;Passive range of motion;Dry needling;Manual techniques;Patient/family education;Orthotic Fit/Training;Neuromuscular re-education;Balance training;Gait training;Stair training;Functional mobility training;Therapeutic activities;Therapeutic exercise   PT Next Visit Plan Ankle DF ROM, ankle strength 4 way with band if able to tolerate, foot intrinsic muscle strength (arch lifts, toe abd, big to ext, etc.)   PT Home Exercise Plan Supine ankle PF 2x10 reps, arch lifts seated 10x3 sec    Recommended Other Services none    Consulted and Agree with Plan of Care Patient      Patient will benefit from skilled therapeutic intervention in order to improve the following deficits and impairments:  Abnormal gait, Decreased activity tolerance, Decreased strength, Impaired flexibility, Postural dysfunction, Pain, Decreased range of motion, Decreased balance, Decreased mobility, Difficulty walking, Hypomobility  Visit Diagnosis: Pain in right foot  Pain in left foot  Stiffness of right foot, not elsewhere classified  Stiffness of left foot, not elsewhere classified  Other abnormalities of gait and mobility      G-Codes - 06/24/16 1533    Functional Assessment Tool Used FOTO: 59% limited   Functional Limitation Mobility: Walking and moving around   Mobility: Walking and Moving Around Current Status [phone]) At least 40 percent but less than 60 percent impaired, limited or restricted   Mobility: Walking and Moving Around Goal Status [phone]) At least 20 percent but less than 40 percent impaired, limited or restricted       Problem List Patient Active Problem List   Diagnosis Date Noted  . Abnormal CT scan, colon 05/02/2016  . GERD (gastroesophageal reflux disease) 05/02/2016  . Hiatal hernia   . History of colonic polyps   . Diverticulosis of colon without hemorrhage   . Constipation 07/22/2014  . History of  colon cancer 07/20/2014  . Dysphagia 07/20/2014  . Lower abdominal pain 09/30/2013  . Bipolar affective disorder, current episode depressed ([place]) 08/08/2013  . Anxiety, generalized 08/08/2013  . 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 Noncompliance with medications 08/07/2013  . Colon cancer ([place]) 06/23/2013  . Rectal bleeding 05/09/2013  . Gastritis 05/09/2013  . LLQ pain 11/28/2012  . Hemorrhagic cyst of ovary 11/28/2012  . SHOULDER PAIN, LEFT 05/18/2010  . ACUTE BRONCHITIS 09/03/2008  . UNSPECIFIED URINARY INCONTINENCE 09/03/2008  . ACUTE CYSTITIS 07/19/2008  . Abdominal pain, generalized 07/09/2008  . OBESITY 11/22/2007  . UNSPECIFIED PSYCHOSIS 11/22/2007  . MIGRAINE HEADACHE 11/22/2007  . HYPERTENSION 11/22/2007  . BACK PAIN 11/22/2007  . LATERAL EPICONDYLITIS OF ELBOW 11/22/2007   3:37 PM,06/24/2016 Elly Modena PT, DPT [facility] [place] [address] [place], [place], [address] Phone: [phone]   Fax:  [phone]  Name: Ashley Patrick MRN: [id number] Date of Birth: 1955/08/31

## 2016-05-31 ENCOUNTER — Ambulatory Visit (HOSPITAL_COMMUNITY): Payer: Medicare Other

## 2016-05-31 DIAGNOSIS — M25675 Stiffness of left foot, not elsewhere classified: Secondary | ICD-10-CM

## 2016-05-31 DIAGNOSIS — M79672 Pain in left foot: Secondary | ICD-10-CM

## 2016-05-31 DIAGNOSIS — M79671 Pain in right foot: Secondary | ICD-10-CM

## 2016-05-31 DIAGNOSIS — M25674 Stiffness of right foot, not elsewhere classified: Secondary | ICD-10-CM

## 2016-05-31 DIAGNOSIS — R2689 Other abnormalities of gait and mobility: Secondary | ICD-10-CM

## 2016-05-31 NOTE — Therapy (Signed)
[place] [address] [place], [place], [address] Phone: [phone]   Fax:  [phone]  Physical Therapy Treatment  Patient Details  Name: Ashley Patrick MRN: [id number] Date of Birth: [birth year] Referring Provider: Arther Abbott, MD  Encounter Date: 05/31/2016      PT End of Session - 05/31/16 0842    Visit Number 2   Number of Visits 17   Date for PT Re-Evaluation 06/27/16   Authorization Type UHC medicare   Authorization Time Period 05/29/16 to 07/24/16   PT Start Time 0815   PT Stop Time 0856   PT Time Calculation (min) 41 min   Activity Tolerance Patient limited by pain;Patient tolerated treatment well;No increased pain  Rt foot pain   Behavior During Therapy WFL for tasks assessed/performed      Past Medical History:  Diagnosis Date  . Back pain   . Bipolar 1 disorder ([place])   . Cancer [facility]) 2014   Colon Cancer  . Hypercholesteremia   . Hypertension   . Lateral epicondylitis  of elbow    right  . Migraine headache   . Nicotine addiction   . Obesity    History of  . OD (overdose of drug)    hospitalized for 11 days   . Psychosis     Past Surgical History:  Procedure Laterality Date  . ABDOMINAL HYSTERECTOMY    . BIOPSY N/A 08/13/2014   Procedure: BIOPSY;  Surgeon: Daneil Dolin, MD;  Location: [facility];  Service: Endoscopy;  Laterality: N/A;  . BREAST BIOPSY  10/18/2011   Procedure: BREAST BIOPSY;  Surgeon: Jamesetta So, MD;  Location: [facility];  Service: General;  Laterality: Left;  . COLON RESECTION N/A 06/23/2013   Procedure: HAND ASSISTED LAPAROSCOPIC PARTIAL COLECTOMY;  Surgeon: Jamesetta So, MD;  Location: [facility];  Service: General;  Laterality: N/A;  . COLONOSCOPY    . COLONOSCOPY N/A 05/29/2013   [id number] mass most likely representing colorectal cancer S/ P biospy.Multiple colonic and rectal polyps removed/treated as described above. Colonic diverticulosis  . COLONOSCOPY WITH PROPOFOL N/A  08/13/2014   RMR: Status post sigmoid colectomy. Multiple colonic polyps removed as described above. Redundant colon. Pan colonic diverticuloisi  . COLONOSCOPY WITH PROPOFOL N/A 05/22/2016   Procedure: COLONOSCOPY WITH PROPOFOL;  Surgeon: Daneil Dolin, MD;  [facility];  Service: Endoscopy;  Laterality: N/A;  7:30 am  . ESOPHAGEAL DILATION N/A 08/13/2014   Procedure: [place];  Surgeon: Daneil Dolin, MD;  Location: [facility];  Service: Endoscopy;  Laterality: N/A;  . ESOPHAGOGASTRODUODENOSCOPY N/A 12/26/2012   [id number] reflux esophagitis. Gastric and duodenal bulbar erosions-status post gastric biopsynegative H.pylori  . ESOPHAGOGASTRODUODENOSCOPY (EGD) WITH PROPOFOL N/A 08/13/2014   RMR: Small benign cystic-appearing lesion in distal esophagus of doubtful clinical significance, otherwise normal esophagus, status post Maloney dilation. Small hiatal hernia, some retained gastric contents (query delayed gastric emptying.)  . partial hysterectomy    . POLYPECTOMY N/A 08/13/2014   Procedure: POLYPECTOMY;  Surgeon: Daneil Dolin, MD;  Location: [facility];  Service: Endoscopy;  Laterality: N/A;    There were no vitals filed for this visit.      Subjective Assessment - 05/31/16 0811    Subjective Pt entered dept wearing tennis shoes and reports Lt foot feels better out of the boot.  Reports her Rt foot has increased pain today, pain scale 4/10 throbbing achey feeling.  Reports compliance with HEP   Pertinent  History Back pain, bipolar disorder, HTN, obesity, psychosis   Patient Stated Goals decrease pain    Currently in Pain? Yes   Pain Score 4    Pain Location Foot   Pain Orientation Right   Pain Descriptors / Indicators Aching;Throbbing   Pain Type Acute pain   Pain Onset More than a month ago   Aggravating Factors  weight bearing   Pain Relieving Factors nonweight bearing   Effect of Pain on Daily Activities decreased ability to complete standing  activites                         [facility] Adult PT Treatment/Exercise - 05/31/16 0001      Ankle Exercises: Seated   Heel Raises 10 reps   Toe Raise 10 reps   Other Seated Ankle Exercises arch lift x3 sec hold, x5 reps each   Other Seated Ankle Exercises Toe extension 5x 5"; Great toe ext 5x 5", toe abd 5x     Ankle Exercises: Stretches   Plantar Fascia Stretch 2 reps;30 seconds   Slant Board Stretch 3 reps;30 seconds     Ankle Exercises: Supine   T-Band RTB all directions 10x                PT Education - 05/31/16 0858    Education provided Yes   Education Details Reviewed goals, compliance and assured correct technqiue with HEP and copy of eval given to pt.   Person(s) Educated Patient   Methods Explanation;Demonstration;Handout   Comprehension Verbalized understanding;Returned demonstration          PT Short Term Goals - 05/29/16 1526      PT SHORT TERM GOAL #1   Title Pt will demo consistency and independence with her HEP to improve ankle ROM and strength.    Time 2   Period Weeks   Status New     PT SHORT TERM GOAL #2   Title Pt will demo improved pain report to no greater than 4/10 to allow her to resume daily activity without significant discomfort.   Time 4   Period Weeks   Status New           PT Long Term Goals - 05/29/16 1528      PT LONG TERM GOAL #1   Title Pt will demo improved B ankle ROM to lacking no more than 10 degrees of DF to improve mechanics with ambulation.    Time 8   Period Weeks   Status New     PT LONG TERM GOAL #2   Title Pt will demo improved ankle strength in inversion/DF/eversion to 5/5 MMT to improve safety with functional activity.   Time 8   Period Weeks   Status New     PT LONG TERM GOAL #3   Title Pt will demo improved gastroc strength to atleast 4/5 MMT to allow her to reach over head for objects at home.    Time 8   Period Weeks   Status New     PT LONG TERM GOAL #4   Title Pt will  demo improved gait mechanics evident by her ability to ambulate with equal step length and without noted antalgic pattern, x161ft, to allow her to return to treadmill walking at the [facility] for overall fitness.     Time 8   Period Weeks   Status New     PT LONG TERM GOAL #5   Title Pt will  demo improved balace and safety evident by her ability to maintain SLS on each LE for up to 10 sec without LOB, 3/5 trials.    Time 8   Period Weeks   Status New               Plan - 05/31/16 [id number]    Clinical Impression Statement Reviewed goals, assured compliance and proper technique with HEP and copy of eval given to pt.  Session focus on improving ankle mobility with stretches and strengthening for ankle and arch.  Pt able to complete all exercises with no reports of pain through session.  Did require moderate cueing to improve great toe extension with Rt LE.  EOS pt reports pain reduced to 3/10 with improved gait mechanics.     Rehab Potential Good   PT Frequency 2x / week   PT Duration 8 weeks   PT Treatment/Interventions ADLs/Self Care Home Management;Cryotherapy;Passive range of motion;Dry needling;Manual techniques;Patient/family education;Orthotic Fit/Training;Neuromuscular re-education;Balance training;Gait training;Stair training;Functional mobility training;Therapeutic activities;Therapeutic exercise   PT Next Visit Plan Ankle DF ROM, ankle strength 4 way with band if able to tolerate, foot intrinsic muscle strength (arch lifts, toe abd, big to ext, etc.)   PT Home Exercise Plan Supine ankle PF 2x10 reps, arch lifts seated 10x3 sec       Patient will benefit from skilled therapeutic intervention in order to improve the following deficits and impairments:  Abnormal gait, Decreased activity tolerance, Decreased strength, Impaired flexibility, Postural dysfunction, Pain, Decreased range of motion, Decreased balance, Decreased mobility, Difficulty walking, Hypomobility  Visit Diagnosis: Pain  in right foot  Pain in left foot  Stiffness of right foot, not elsewhere classified  Stiffness of left foot, not elsewhere classified  Other abnormalities of gait and mobility     Problem List Patient Active Problem List   Diagnosis Date Noted  . Abnormal CT scan, colon 05/02/2016  . GERD (gastroesophageal reflux disease) 05/02/2016  . Hiatal hernia   . History of colonic polyps   . Diverticulosis of colon without hemorrhage   . Constipation 07/22/2014  . History of colon cancer 07/20/2014  . Dysphagia 07/20/2014  . Lower abdominal pain 09/30/2013  . Bipolar affective disorder, current episode depressed ([place]) 08/08/2013  . Anxiety, generalized 08/08/2013  . 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 Noncompliance with medications 08/07/2013  . Colon cancer ([place]) 06/23/2013  . Rectal bleeding 05/09/2013  . Gastritis 05/09/2013  . LLQ pain 11/28/2012  . Hemorrhagic cyst of ovary 11/28/2012  . SHOULDER PAIN, LEFT 05/18/2010  . ACUTE BRONCHITIS 09/03/2008  . UNSPECIFIED URINARY INCONTINENCE 09/03/2008  . ACUTE CYSTITIS 07/19/2008  . Abdominal pain, generalized 07/09/2008  . OBESITY 11/22/2007  . UNSPECIFIED PSYCHOSIS 11/22/2007  . MIGRAINE HEADACHE 11/22/2007  . HYPERTENSION 11/22/2007  . BACK PAIN 11/22/2007  . LATERAL EPICONDYLITIS OF ELBOW 11/22/2007   Ihor Austin, [place]; [place]  Aldona Lento 05/31/2016, 8:59 AM  [place], [place], [address] Phone: [phone]   Fax:  [phone]  Name: Ashley Patrick MRN: [id number] Date of Birth: 03-24-1956

## 2016-06-06 ENCOUNTER — Ambulatory Visit (HOSPITAL_COMMUNITY): Payer: Medicare Other | Admitting: Physical Therapy

## 2016-06-06 DIAGNOSIS — M79671 Pain in right foot: Secondary | ICD-10-CM | POA: Diagnosis not present

## 2016-06-06 DIAGNOSIS — M79672 Pain in left foot: Secondary | ICD-10-CM

## 2016-06-06 DIAGNOSIS — M25675 Stiffness of left foot, not elsewhere classified: Secondary | ICD-10-CM

## 2016-06-06 DIAGNOSIS — R2689 Other abnormalities of gait and mobility: Secondary | ICD-10-CM

## 2016-06-06 DIAGNOSIS — M25674 Stiffness of right foot, not elsewhere classified: Secondary | ICD-10-CM

## 2016-06-06 NOTE — Therapy (Signed)
[place], [place], [address] Phone: [phone]   Fax:  [phone]  Physical Therapy Treatment  Patient Details  Name: Ashley Patrick MRN: [id number] Date of Birth: 05-19-1956 Referring Provider: Arther Abbott, MD  Encounter Date: 06/06/2016      PT End of Session - 06/06/16 1205    Visit Number 3   Number of Visits 17   Date for PT Re-Evaluation 06/27/16   Authorization Type [employer] medicare   Authorization Time Period 05/29/16 to 07/24/16   Authorization - Visit Number 3   Authorization - Number of Visits 10   PT Start Time 0820   PT Stop Time 0900   PT Time Calculation (min) 40 min   Activity Tolerance Patient limited by pain;Patient tolerated treatment well;No increased pain  Rt foot pain   Behavior During Therapy WFL for tasks assessed/performed      Past Medical History:  Diagnosis Date  . Back pain   . Bipolar 1 disorder ([place])   . Cancer [facility]) 2014   Colon Cancer  . Hypercholesteremia   . Hypertension   . Lateral epicondylitis  of elbow    right  . Migraine headache   . Nicotine addiction   . Obesity    History of  . OD (overdose of drug)    hospitalized for 11 days   . Psychosis     Past Surgical History:  Procedure Laterality Date  . ABDOMINAL HYSTERECTOMY    . BIOPSY N/A 08/13/2014   Procedure: BIOPSY;  Surgeon: Daneil Dolin, MD;  Location: [facility];  Service: Endoscopy;  Laterality: N/A;  . BREAST BIOPSY  10/18/2011   Procedure: BREAST BIOPSY;  Surgeon: Jamesetta So, MD;  Location: [facility];  Service: General;  Laterality: Left;  . COLON RESECTION N/A 06/23/2013   Procedure: HAND ASSISTED LAPAROSCOPIC PARTIAL COLECTOMY;  Surgeon: Jamesetta So, MD;  Location: [facility];  Service: General;  Laterality: N/A;  . COLONOSCOPY    . COLONOSCOPY N/A 05/29/2013   [id number] mass most likely representing colorectal cancer S/ P biospy.Multiple colonic and rectal polyps removed/treated as  described above. Colonic diverticulosis  . COLONOSCOPY WITH PROPOFOL N/A 08/13/2014   RMR: Status post sigmoid colectomy. Multiple colonic polyps removed as described above. Redundant colon. Pan colonic diverticuloisi  . COLONOSCOPY WITH PROPOFOL N/A 05/22/2016   Procedure: COLONOSCOPY WITH PROPOFOL;  Surgeon: Daneil Dolin, MD;  [facility];  Service: Endoscopy;  Laterality: N/A;  7:30 am  . ESOPHAGEAL DILATION N/A 08/13/2014   Procedure: [place];  Surgeon: Daneil Dolin, MD;  Location: [facility];  Service: Endoscopy;  Laterality: N/A;  . ESOPHAGOGASTRODUODENOSCOPY N/A 12/26/2012   [id number] reflux esophagitis. Gastric and duodenal bulbar erosions-status post gastric biopsynegative H.pylori  . ESOPHAGOGASTRODUODENOSCOPY (EGD) WITH PROPOFOL N/A 08/13/2014   RMR: Small benign cystic-appearing lesion in distal esophagus of doubtful clinical significance, otherwise normal esophagus, status post Maloney dilation. Small hiatal hernia, some retained gastric contents (query delayed gastric emptying.)  . partial hysterectomy    . POLYPECTOMY N/A 08/13/2014   Procedure: POLYPECTOMY;  Surgeon: Daneil Dolin, MD;  Location: [facility];  Service: Endoscopy;  Laterality: N/A;    There were no vitals filed for this visit.      Subjective Assessment - 06/06/16 0827    Subjective Pt states she is doing well today other than soreness in the area of Rt foot bunyon area.  No other pains or complaints  Currently in Pain? Yes   Pain Score 4    Pain Location Foot   Pain Orientation Right   Pain Descriptors / Indicators Sore                         [facility] Adult PT Treatment/Exercise - 06/06/16 0001      Ankle Exercises: Seated   Heel Raises 15 reps   Toe Raise 15 reps     Ankle Exercises: Stretches   Plantar Fascia Stretch 2 reps;30 seconds   Slant Board Stretch 3 reps;30 seconds     Ankle Exercises: Standing   SLS bilaterally no UE max of 5 Rt: 5",  Lt: 20"   Other Standing Ankle Exercises lateral step up 4" 10 reps each     Ankle Exercises: Supine   T-Band RTB all directions 10x  seated instructions for home                PT Education - 06/06/16 0858    Education provided Yes   Education Details issued theraband 4 way for bilateral ankles.  Given written instructions and red theraband   Person(s) Educated Patient   Methods Explanation;Demonstration;Tactile cues;Verbal cues;Handout   Comprehension Verbalized understanding;Returned demonstration;Verbal cues required;Tactile cues required;Need further instruction          PT Short Term Goals - 05/29/16 1526      PT SHORT TERM GOAL #1   Title Pt will demo consistency and independence with her HEP to improve ankle ROM and strength.    Time 2   Period Weeks   Status New     PT SHORT TERM GOAL #2   Title Pt will demo improved pain report to no greater than 4/10 to allow her to resume daily activity without significant discomfort.   Time 4   Period Weeks   Status New           PT Long Term Goals - 05/29/16 1528      PT LONG TERM GOAL #1   Title Pt will demo improved B ankle ROM to lacking no more than 10 degrees of DF to improve mechanics with ambulation.    Time 8   Period Weeks   Status New     PT LONG TERM GOAL #2   Title Pt will demo improved ankle strength in inversion/DF/eversion to 5/5 MMT to improve safety with functional activity.   Time 8   Period Weeks   Status New     PT LONG TERM GOAL #3   Title Pt will demo improved gastroc strength to atleast 4/5 MMT to allow her to reach over head for objects at home.    Time 8   Period Weeks   Status New     PT LONG TERM GOAL #4   Title Pt will demo improved gait mechanics evident by her ability to ambulate with equal step length and without noted antalgic pattern, x134ft, to allow her to return to treadmill walking at the [facility] for overall fitness.     Time 8   Period Weeks   Status New     PT  LONG TERM GOAL #5   Title Pt will demo improved balace and safety evident by her ability to maintain SLS on each LE for up to 10 sec without LOB, 3/5 trials.    Time 8   Period Weeks   Status New  Plan - 06/06/16 1205    Clinical Impression Statement Continued with LE strengthening focus.  Added standing SLS and continued instruction with ankle theraband exercises.  Pt requires min assist with this for form.  Given a handout and red theraband for home.  Pt able to SLS max of 5" on Rt LE and 20" on Lt  LE without UE asssit.  pt only c/o pain in Rt bunyon area, which she states she does have a bunyon present. No increase or change in pain reported at end of session.    Rehab Potential Good   PT Frequency 2x / week   PT Duration 8 weeks   PT Treatment/Interventions ADLs/Self Care Home Management;Cryotherapy;Passive range of motion;Dry needling;Manual techniques;Patient/family education;Orthotic Fit/Training;Neuromuscular re-education;Balance training;Gait training;Stair training;Functional mobility training;Therapeutic activities;Therapeutic exercise   PT Next Visit Plan Continue to progress Bilateral ankle strength and stability.     PT Home Exercise Plan Supine ankle PF 2x10 reps, arch lifts seated 10x3 sec  12/19: seated theraband exercises, all directions        Patient will benefit from skilled therapeutic intervention in order to improve the following deficits and impairments:  Abnormal gait, Decreased activity tolerance, Decreased strength, Impaired flexibility, Postural dysfunction, Pain, Decreased range of motion, Decreased balance, Decreased mobility, Difficulty walking, Hypomobility  Visit Diagnosis: Pain in right foot  Pain in left foot  Stiffness of right foot, not elsewhere classified  Other abnormalities of gait and mobility  Stiffness of left foot, not elsewhere classified     Problem List Patient Active Problem List   Diagnosis Date Noted  .  Abnormal CT scan, colon 05/02/2016  . GERD (gastroesophageal reflux disease) 05/02/2016  . Hiatal hernia   . History of colonic polyps   . Diverticulosis of colon without hemorrhage   . Constipation 07/22/2014  . History of colon cancer 07/20/2014  . Dysphagia 07/20/2014  . Lower abdominal pain 09/30/2013  . Bipolar affective disorder, current episode depressed ([place]) 08/08/2013  . Anxiety, generalized 08/08/2013  . 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 Noncompliance with medications 08/07/2013  . Colon cancer ([place]) 06/23/2013  . Rectal bleeding 05/09/2013  . Gastritis 05/09/2013  . LLQ pain 11/28/2012  . Hemorrhagic cyst of ovary 11/28/2012  . SHOULDER PAIN, LEFT 05/18/2010  . ACUTE BRONCHITIS 09/03/2008  . UNSPECIFIED URINARY INCONTINENCE 09/03/2008  . ACUTE CYSTITIS 07/19/2008  . Abdominal pain, generalized 07/09/2008  . OBESITY 11/22/2007  . UNSPECIFIED PSYCHOSIS 11/22/2007  . MIGRAINE HEADACHE 11/22/2007  . HYPERTENSION 11/22/2007  . BACK PAIN 11/22/2007  . LATERAL EPICONDYLITIS OF ELBOW 11/22/2007    Teena Irani, PTA/CLT [phone]  06/06/2016, 12:10 PM  [place] [address] [place], [place], [address] Phone: [phone]   Fax:  [phone]  Name: DIETRA DIFFEY MRN: [id number] Date of Birth: 1955/09/23

## 2016-06-07 ENCOUNTER — Ambulatory Visit (HOSPITAL_COMMUNITY): Payer: Medicare Other | Admitting: Physical Therapy

## 2016-06-07 ENCOUNTER — Telehealth (HOSPITAL_COMMUNITY): Payer: Self-pay | Admitting: Internal Medicine

## 2016-06-07 NOTE — Telephone Encounter (Signed)
06/07/16 pt cx because she has a stomach virus

## 2016-06-09 ENCOUNTER — Other Ambulatory Visit: Payer: Self-pay | Admitting: Gastroenterology

## 2016-06-09 ENCOUNTER — Encounter: Payer: Self-pay | Admitting: Internal Medicine

## 2016-06-13 ENCOUNTER — Ambulatory Visit (HOSPITAL_COMMUNITY): Payer: Medicare Other

## 2016-06-13 ENCOUNTER — Telehealth (HOSPITAL_COMMUNITY): Payer: Self-pay

## 2016-06-13 NOTE — Telephone Encounter (Signed)
No show, called and left message about missed apt.  Included next apt date and time wiht contact info given  [address], [place]; [facility] [phone]

## 2016-06-15 ENCOUNTER — Encounter: Payer: Self-pay | Admitting: Gastroenterology

## 2016-06-15 ENCOUNTER — Telehealth: Payer: Self-pay

## 2016-06-15 ENCOUNTER — Ambulatory Visit (HOSPITAL_COMMUNITY)
Admission: RE | Admit: 2016-06-15 | Discharge: 2016-06-15 | Disposition: A | Discharge status: Home / Self Care | Payer: Medicare Other | Source: Ambulatory Visit | Attending: Internal Medicine | Admitting: Internal Medicine

## 2016-06-15 ENCOUNTER — Other Ambulatory Visit: Payer: Self-pay | Admitting: Internal Medicine

## 2016-06-15 ENCOUNTER — Encounter: Payer: Self-pay | Admitting: Internal Medicine

## 2016-06-15 DIAGNOSIS — K5730 Diverticulosis of large intestine without perforation or abscess without bleeding: Secondary | ICD-10-CM | POA: Insufficient documentation

## 2016-06-15 DIAGNOSIS — Z980 Intestinal bypass and anastomosis status: Secondary | ICD-10-CM

## 2016-06-15 NOTE — Telephone Encounter (Signed)
Letter mailed to the pt. She is have [facility] today. Please schedule ov.

## 2016-06-15 NOTE — Telephone Encounter (Signed)
APPT MADE AND LETTER SENT  °

## 2016-06-15 NOTE — Telephone Encounter (Signed)
Per RMR- Send letter to patient.  Send copy of letter with path to referring provider and PCP.   Office visit with [place] in 3 months.  Patient should have an air-contrast barium enema in the works.

## 2016-06-16 ENCOUNTER — Ambulatory Visit (HOSPITAL_COMMUNITY): Payer: Medicare Other

## 2016-06-16 NOTE — Telephone Encounter (Signed)
No show, called and spoke to pt who stated she had over slept and offered to come into session though only had 15-20 minutes left for sessoin time.  Pt explained no show policy.  Reminded next apt date and time with contact info given.    [address] [place]; [facility] [phone]

## 2016-06-20 ENCOUNTER — Encounter (HOSPITAL_COMMUNITY): Payer: Self-pay | Admitting: Emergency Medicine

## 2016-06-20 ENCOUNTER — Emergency Department (HOSPITAL_COMMUNITY)
Admission: EM | Admit: 2016-06-20 | Discharge: 2016-06-21 | Disposition: A | Discharge status: Home / Self Care | Payer: Medicare Other | Attending: Emergency Medicine | Admitting: Emergency Medicine

## 2016-06-20 ENCOUNTER — Ambulatory Visit (HOSPITAL_COMMUNITY): Payer: Medicare Other | Admitting: Physical Therapy

## 2016-06-20 ENCOUNTER — Emergency Department (HOSPITAL_COMMUNITY): Payer: Medicare Other

## 2016-06-20 DIAGNOSIS — R509 Fever, unspecified: Secondary | ICD-10-CM | POA: Diagnosis not present

## 2016-06-20 DIAGNOSIS — R51 Headache: Secondary | ICD-10-CM | POA: Insufficient documentation

## 2016-06-20 DIAGNOSIS — R69 Illness, unspecified: Secondary | ICD-10-CM

## 2016-06-20 DIAGNOSIS — I10 Essential (primary) hypertension: Secondary | ICD-10-CM | POA: Insufficient documentation

## 2016-06-20 DIAGNOSIS — R05 Cough: Secondary | ICD-10-CM | POA: Insufficient documentation

## 2016-06-20 DIAGNOSIS — F17210 Nicotine dependence, cigarettes, uncomplicated: Secondary | ICD-10-CM | POA: Diagnosis not present

## 2016-06-20 DIAGNOSIS — Z85038 Personal history of other malignant neoplasm of large intestine: Secondary | ICD-10-CM | POA: Insufficient documentation

## 2016-06-20 DIAGNOSIS — Z791 Long term (current) use of non-steroidal anti-inflammatories (NSAID): Secondary | ICD-10-CM | POA: Diagnosis not present

## 2016-06-20 DIAGNOSIS — J111 Influenza due to unidentified influenza virus with other respiratory manifestations: Secondary | ICD-10-CM

## 2016-06-20 DIAGNOSIS — R0602 Shortness of breath: Secondary | ICD-10-CM | POA: Diagnosis not present

## 2016-06-20 DIAGNOSIS — Z79899 Other long term (current) drug therapy: Secondary | ICD-10-CM | POA: Diagnosis not present

## 2016-06-20 MED ORDER — ALBUTEROL SULFATE HFA 108 (90 BASE) MCG/ACT IN AERS
2.0000 | INHALATION_SPRAY | Freq: Once | RESPIRATORY_TRACT | Status: AC
  Administered 2016-06-20: 2 via RESPIRATORY_TRACT

## 2016-06-20 MED ORDER — IBUPROFEN 800 MG PO TABS
800.0000 mg | ORAL_TABLET | Freq: Once | ORAL | Status: AC
  Administered 2016-06-20: 800 mg via ORAL
  Filled 2016-06-20: qty 1

## 2016-06-20 MED ORDER — OSELTAMIVIR PHOSPHATE 75 MG PO CAPS: 75.0000 mg | ORAL_CAPSULE | Freq: Two times a day (BID) | ORAL | 0 refills | Status: DC

## 2016-06-20 NOTE — ED Provider Notes (Signed)
[facility] [facility] DEPT Provider Note   CSN: [id number] Arrival date & time: 06/20/16  2050   By signing my name below, I, Hilbert Odor, attest that this documentation has been prepared under the direction and in the presence of Ripley Fraise, MD. Electronically Signed: Hilbert Odor, Scribe. 06/20/16. 11:07 PM. History   Chief Complaint Chief Complaint  Patient presents with  . Generalized Body Aches    HPI Ashley Patrick is a 61 y.o. female.  The history is provided by the patient. No language interpreter was used.  Cough  This is a new problem. The current episode started more than 1 week ago. The problem has been gradually worsening. The cough is productive of sputum. The fever has been present for 1 to 2 days. Associated symptoms include chills and headaches. Pertinent negatives include no chest pain and no sore throat. She is a smoker. Her past medical history does not include COPD.  She denies nausea, vomiting, sore throat. She denies hx of any pulmonary diseases.  Past Medical History:  Diagnosis Date  . Back pain   . Bipolar 1 disorder ([place])   . Cancer [facility]) 2014   Colon Cancer  . Hypercholesteremia   . Hypertension   . Lateral epicondylitis  of elbow    right  . Migraine headache   . Nicotine addiction   . Obesity    History of  . OD (overdose of drug)    hospitalized for 11 days   . Psychosis     Patient Active Problem List   Diagnosis Date Noted  . Abnormal CT scan, colon 05/02/2016  . GERD (gastroesophageal reflux disease) 05/02/2016  . Hiatal hernia   . History of colonic polyps   . Diverticulosis of colon without hemorrhage   . Constipation 07/22/2014  . History of colon cancer 07/20/2014  . Dysphagia 07/20/2014  . Lower abdominal pain 09/30/2013  . Bipolar affective disorder, current episode depressed ([place]) 08/08/2013  . Anxiety, generalized 08/08/2013  . 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 Noncompliance  with medications 08/07/2013  . Colon cancer ([place]) 06/23/2013  . Rectal bleeding 05/09/2013  . Gastritis 05/09/2013  . LLQ pain 11/28/2012  . Hemorrhagic cyst of ovary 11/28/2012  . SHOULDER PAIN, LEFT 05/18/2010  . ACUTE BRONCHITIS 09/03/2008  . UNSPECIFIED URINARY INCONTINENCE 09/03/2008  . ACUTE CYSTITIS 07/19/2008  . Abdominal pain, generalized 07/09/2008  . OBESITY 11/22/2007  . UNSPECIFIED PSYCHOSIS 11/22/2007  . MIGRAINE HEADACHE 11/22/2007  . HYPERTENSION 11/22/2007  . BACK PAIN 11/22/2007  . LATERAL EPICONDYLITIS OF ELBOW 11/22/2007    Past Surgical History:  Procedure Laterality Date  . ABDOMINAL HYSTERECTOMY    . BIOPSY N/A 08/13/2014   Procedure: BIOPSY;  Surgeon: Daneil Dolin, MD;  Location: [facility];  Service: Endoscopy;  Laterality: N/A;  . BREAST BIOPSY  10/18/2011   Procedure: BREAST BIOPSY;  Surgeon: Jamesetta So, MD;  Location: [facility];  Service: General;  Laterality: Left;  . COLON RESECTION N/A 06/23/2013   Procedure: HAND ASSISTED LAPAROSCOPIC PARTIAL COLECTOMY;  Surgeon: Jamesetta So, MD;  Location: [facility];  Service: General;  Laterality: N/A;  . COLONOSCOPY    . COLONOSCOPY N/A 05/29/2013   [id number] mass most likely representing colorectal cancer S/ P biospy.Multiple colonic and rectal polyps removed/treated as described above. Colonic diverticulosis  . COLONOSCOPY WITH PROPOFOL N/A 08/13/2014   RMR: Status post sigmoid colectomy. Multiple colonic polyps removed as described above. Redundant colon. Pan colonic diverticuloisi  . COLONOSCOPY  WITH PROPOFOL N/A 05/22/2016   Procedure: COLONOSCOPY WITH PROPOFOL;  Surgeon: Daneil Dolin, MD;  [facility];  Service: Endoscopy;  Laterality: N/A;  7:30 am  . ESOPHAGEAL DILATION N/A 08/13/2014   Procedure: [place];  Surgeon: Daneil Dolin, MD;  Location: [facility];  Service: Endoscopy;  Laterality: N/A;  . ESOPHAGOGASTRODUODENOSCOPY N/A 12/26/2012   [id number] reflux  esophagitis. Gastric and duodenal bulbar erosions-status post gastric biopsynegative H.pylori  . ESOPHAGOGASTRODUODENOSCOPY (EGD) WITH PROPOFOL N/A 08/13/2014   RMR: Small benign cystic-appearing lesion in distal esophagus of doubtful clinical significance, otherwise normal esophagus, status post Maloney dilation. Small hiatal hernia, some retained gastric contents (query delayed gastric emptying.)  . partial hysterectomy    . POLYPECTOMY N/A 08/13/2014   Procedure: POLYPECTOMY;  Surgeon: Daneil Dolin, MD;  Location: [facility];  Service: Endoscopy;  Laterality: N/A;    OB History    Gravida Para Term Preterm AB Living   4 3 3   1 3    SAB TAB Ectopic Multiple Live Births     1             Home Medications    Prior to Admission medications   Medication Sig Start Date End Date Taking? Authorizing Provider  amLODipine-benazepril (LOTREL) 10-20 MG per capsule Take 1 capsule by mouth daily.  12/04/14   Historical Provider, MD  ARIPiprazole (ABILIFY) 15 MG tablet Take 15 mg by mouth every evening.  02/25/16   Historical Provider, MD  Cholecalciferol (VITAMIN D3) 2000 UNITS TABS Take 1 tablet by mouth daily.    Historical Provider, MD  clonazePAM (KLONOPIN) 1 MG tablet Take 1 mg by mouth 3 (three) times daily.  12/18/14   Historical Provider, MD  Docusate Calcium (STOOL SOFTENER PO) Take 2 tablets by mouth daily.    Historical Provider, MD  fluticasone (FLONASE) 50 MCG/ACT nasal spray Place 1 spray into both nostrils daily as needed for allergies.  12/16/14   Historical Provider, MD  hydrochlorothiazide (HYDRODIURIL) 50 MG tablet Take 50 mg by mouth daily.    Historical Provider, MD  HYDROcodone-acetaminophen (NORCO/VICODIN) 5-325 MG tablet Take 1 tablet by mouth every 6 (six) hours as needed for moderate pain.  04/12/16   Historical Provider, MD  linaclotide (LINZESS) 145 MCG CAPS capsule Take 145 mcg by mouth daily before breakfast.    Historical Provider, MD  lovastatin (MEVACOR) 20 MG tablet Take 20  mg by mouth at bedtime.    Historical Provider, MD  naproxen (NAPROSYN) 500 MG tablet Take 500 mg by mouth 2 (two) times daily as needed.    Historical Provider, MD  omeprazole (PRILOSEC) 20 MG capsule TAKE ONE CAPSULE BY MOUTH 30 MINUTES PRIOR TO FIRST MEAL OF THE DAY. 06/09/16   Annitta Needs, NP  predniSONE (DELTASONE) 10 MG tablet Take 1 tablet (10 mg total) by mouth 2 (two) times daily with a meal. 05/16/16   Carole Civil, MD  temazepam (RESTORIL) 15 MG capsule Take 15 mg by mouth at bedtime as needed for sleep.    Historical Provider, MD  topiramate (TOPAMAX) 50 MG tablet Take 50 mg by mouth daily. 03/07/16   Historical Provider, MD  VESICARE 10 MG tablet Take 10 mg by mouth every evening. 02/25/16   Historical Provider, MD    Family History Family History  Problem Relation Age of Onset  . Colon cancer Father     diagnosed in his 87s  . Anesthesia problems Neg Hx   .  Hypotension Neg Hx   . Malignant hyperthermia Neg Hx   . Pseudochol deficiency Neg Hx     Social History Social History  Substance Use Topics  . Smoking status: Current Every Day Smoker    Packs/day: 0.50    Years: 38.00    Types: Cigarettes    Last attempt to quit: 06/19/2014  . Smokeless tobacco: Never Used     Comment: vapor only  . Alcohol use No     Allergies   Patient has no known allergies.   Review of Systems Review of Systems  Constitutional: Positive for chills and fever.  HENT: Negative for sore throat.   Respiratory: Positive for cough.   Cardiovascular: Negative for chest pain.  Gastrointestinal: Negative for nausea and vomiting.  Neurological: Positive for headaches.       Headache with cough   All other systems reviewed and are negative.    Physical Exam Updated Vital Signs BP 136/78 (BP Location: Left Arm)   Pulse 113   Temp 102.8 F (39.3 C) (Oral)   Resp 19   Ht 5\' 6"  (1.676 m)   Wt 198 lb (89.8 kg)   SpO2 99%   BMI 31.96 kg/m   Physical Exam CONSTITUTIONAL: Well  developed/well nourished, nontoxic, no distress HEAD: Normocephalic/atraumatic EYES: EOMI/PERRL ENMT: Mucous membranes moist. No stridor. NECK: supple no meningeal signs SPINE/BACK:entire spine nontender CV: S1/S2 noted, no murmurs/rubs/gallops noted LUNGS: scattered wheezing bilaterally. No distress noted. ABDOMEN: soft, nontender, no rebound or guarding, bowel sounds noted throughout abdomen GU:no cva tenderness NEURO: Pt is awake/alert/appropriate, moves all extremitiesx4.  No facial droop.   EXTREMITIES: pulses normal/equal, full ROM SKIN: warm, color normal PSYCH: no abnormalities of mood noted, alert and oriented to situation   ED Treatments / Results  DIAGNOSTIC STUDIES: Oxygen Saturation is 99% on RA, normal by my interpretation.    COORDINATION OF CARE: 11:07 PM Discussed treatment plan with pt at bedside and pt agreed to plan.  Labs (all labs ordered are listed, but only abnormal results are displayed) Labs Reviewed - No data to display  EKG  EKG Interpretation None       Radiology Dg Chest 2 View  Result Date: 06/20/2016 CLINICAL DATA:  61 y/o  F; productive cough and shortness of breath. EXAM: CHEST  2 VIEW COMPARISON:  03/29/2016 chest radiograph FINDINGS: Stable cardiac silhouette. Mild degenerative changes of the thoracic spine. Mild increase bronchitic markings. No focal consolidation. No pleural effusion. IMPRESSION: Mild increase bronchitic markings may represent acute bronchitis. No focal consolidation. Electronically Signed   By: Kristine Garbe M.D.   On: 06/20/2016 21:54    Procedures Procedures (including critical care time)  Medications Ordered in ED Medications  ibuprofen (ADVIL,MOTRIN) tablet 800 mg (800 mg Oral Given 06/20/16 2103)  albuterol (PROVENTIL HFA;VENTOLIN HFA) 108 (90 Base) MCG/ACT inhaler 2 puff (2 puffs Inhalation Given 06/20/16 2324)     Initial Impression / Assessment and Plan / ED Course  I have reviewed the triage vital  signs and the nursing notes.  Pertinent imaging results that were available during my care of the patient were reviewed by me and considered in my medical decision making (see chart for details).  Clinical Course     Pt well appearing She has some residual wheeze, but she is a smoker CXR reviewed/negative No distress, speaks to me without difficulty No signs of meningitis Suspect flu like illness for about 48 hours Will start tamiflu We discussed strict ER return precautions BP 100/63 (BP  Location: Right Arm)   Pulse 92   Temp 98.6 F (37 C) (Oral)   Resp 20   Ht 5\' 6"  (1.676 m)   Wt 89.8 kg   SpO2 99%   BMI 31.96 kg/m    Final Clinical Impressions(s) / ED Diagnoses   Final diagnoses:  Influenza-like illness    New Prescriptions Discharge Medication List as of 06/20/2016 11:57 PM    START taking these medications   Details  oseltamivir (TAMIFLU) 75 MG capsule Take 1 capsule (75 mg total) by mouth every 12 (twelve) hours., Starting Tue 06/20/2016, Print       I personally performed the services described in this documentation, which was scribed in my presence. The recorded information has been reviewed and is accurate.       Ripley Fraise, MD 06/21/16 [phone]

## 2016-06-20 NOTE — ED Triage Notes (Signed)
Pt c/o pain all over since yesterday with cough and headache.

## 2016-06-21 ENCOUNTER — Telehealth (HOSPITAL_COMMUNITY): Payer: Self-pay | Admitting: Internal Medicine

## 2016-06-21 NOTE — Telephone Encounter (Signed)
She have the flu and was told by her MD to stay at home

## 2016-06-22 ENCOUNTER — Ambulatory Visit (HOSPITAL_COMMUNITY): Payer: Medicare Other

## 2016-06-26 ENCOUNTER — Ambulatory Visit (HOSPITAL_COMMUNITY): Payer: Medicare Other | Attending: Orthopedic Surgery | Admitting: Physical Therapy

## 2016-06-26 DIAGNOSIS — M79672 Pain in left foot: Secondary | ICD-10-CM | POA: Insufficient documentation

## 2016-06-26 DIAGNOSIS — M79671 Pain in right foot: Secondary | ICD-10-CM | POA: Diagnosis not present

## 2016-06-26 DIAGNOSIS — M25674 Stiffness of right foot, not elsewhere classified: Secondary | ICD-10-CM | POA: Insufficient documentation

## 2016-06-26 DIAGNOSIS — R2689 Other abnormalities of gait and mobility: Secondary | ICD-10-CM | POA: Insufficient documentation

## 2016-06-26 DIAGNOSIS — M25675 Stiffness of left foot, not elsewhere classified: Secondary | ICD-10-CM

## 2016-06-26 NOTE — Therapy (Signed)
[facility] [address] [place], [place], [address] Phone: [phone]   Fax:  [phone]  Physical Therapy Treatment  Patient Details  Name: Ashley Patrick MRN: [id number] Date of Birth: 1956-04-02 Referring Provider: Arther Abbott   Encounter Date: 06/26/2016      PT End of Session - 06/26/16 0933    Visit Number 4   Number of Visits 17   Date for PT Re-Evaluation 06/27/16   Authorization Type UHC medicare   Authorization Time Period 05/29/16 to 07/24/16   Authorization - Visit Number 4  reassessment done at visit 4    Authorization - Number of Visits 10   PT Start Time 0902   PT Stop Time 0940   PT Time Calculation (min) 38 min   Activity Tolerance Patient limited by pain;Patient tolerated treatment well;No increased pain  Rt foot pain   Behavior During Therapy WFL for tasks assessed/performed      Past Medical History:  Diagnosis Date  . Back pain   . Bipolar 1 disorder ([place])   . Cancer [facility]) 2014   Colon Cancer  . Hypercholesteremia   . Hypertension   . Lateral epicondylitis  of elbow    right  . Migraine headache   . Nicotine addiction   . Obesity    History of  . OD (overdose of drug)    hospitalized for 11 days   . Psychosis     Past Surgical History:  Procedure Laterality Date  . ABDOMINAL HYSTERECTOMY    . BIOPSY N/A 08/13/2014   Procedure: BIOPSY;  Surgeon: Daneil Dolin, MD;  Location: [facility] ORS;  Service: Endoscopy;  Laterality: N/A;  . BREAST BIOPSY  10/18/2011   Procedure: BREAST BIOPSY;  Surgeon: Jamesetta So, MD;  Location: [facility] ORS;  Service: General;  Laterality: Left;  . COLON RESECTION N/A 06/23/2013   Procedure: HAND ASSISTED LAPAROSCOPIC PARTIAL COLECTOMY;  Surgeon: Jamesetta So, MD;  Location: [facility] ORS;  Service: General;  Laterality: N/A;  . COLONOSCOPY    . COLONOSCOPY N/A 05/29/2013   [id number] mass most likely representing colorectal cancer S/ P biospy.Multiple colonic and rectal polyps  removed/treated as described above. Colonic diverticulosis  . COLONOSCOPY WITH PROPOFOL N/A 08/13/2014   RMR: Status post sigmoid colectomy. Multiple colonic polyps removed as described above. Redundant colon. Pan colonic diverticuloisi  . COLONOSCOPY WITH PROPOFOL N/A 05/22/2016   Procedure: COLONOSCOPY WITH PROPOFOL;  Surgeon: Daneil Dolin, MD;  Location: [facility];  Service: Endoscopy;  Laterality: N/A;  7:30 am  . ESOPHAGEAL DILATION N/A 08/13/2014   Procedure: [place];  Surgeon: Daneil Dolin, MD;  Location: [facility] ORS;  Service: Endoscopy;  Laterality: N/A;  . ESOPHAGOGASTRODUODENOSCOPY N/A 12/26/2012   [id number] reflux esophagitis. Gastric and duodenal bulbar erosions-status post gastric biopsynegative H.pylori  . ESOPHAGOGASTRODUODENOSCOPY (EGD) WITH PROPOFOL N/A 08/13/2014   RMR: Small benign cystic-appearing lesion in distal esophagus of doubtful clinical significance, otherwise normal esophagus, status post Maloney dilation. Small hiatal hernia, some retained gastric contents (query delayed gastric emptying.)  . partial hysterectomy    . POLYPECTOMY N/A 08/13/2014   Procedure: POLYPECTOMY;  Surgeon: Daneil Dolin, MD;  Location: [facility];  Service: Endoscopy;  Laterality: N/A;    There were no vitals filed for this visit.      Subjective Assessment - 06/26/16 0910    Subjective PT states that her left ankle has been feeling pretty good it is mainly her right leg  that has been aching constantly at a 5/10.  She has been doing her exercises as much as much as possible but sometimes she forgets.    Pain Onset 1 to 4 weeks ago            [facility] PT Assessment - 06/26/16 0001      Assessment   Medical Diagnosis B achilles tendonitis   Referring Provider Arther Abbott    Onset Date/Surgical Date 02/28/16  approximately   Next MD Visit 06/27/16   Prior Therapy none      Precautions   Precaution Comments Lt cam boot (6 weeks per MD)      Balance  Screen   Has the patient fallen in the past 6 months No   Has the patient had a decrease in activity level because of a fear of falling?  No   Is the patient reluctant to leave their home because of a fear of falling?  No     Prior Function   Level of Independence Independent   Vocation On disability   Leisure Art therapist, [employer] every morning (treadmill, bike, [employer])      Cognition   Overall Cognitive Status Within Functional Limits for tasks assessed     Observation/Other Assessments   Observations wearing Lt cam boot    Focus on Therapeutic Outcomes (FOTO)  59% limited      Sensation   Light Touch Appears Intact     Functional Tests   Functional tests Single leg stance     Single Leg Stance   Comments unable without UE assistance      Posture/Postural Control   Posture Comments Standing: B foot ER, pronation, Lt 1MTP bunion     AROM   Overall AROM Comments Closed chain DF: Rt 21 deg, Lt: 24 deg   Right Ankle Dorsiflexion 5  was 5    Right Ankle Plantar Flexion 50   Right Ankle Inversion 30   Left Ankle Dorsiflexion 10  was 3    Left Ankle Plantar Flexion 50  was 40    Left Ankle Inversion 30     Strength   Right Ankle Dorsiflexion 4+/5   Right Ankle Plantar Flexion 4-/5  was 2-/5   Left Ankle Dorsiflexion 4+/5   Left Ankle Plantar Flexion 4-/5  was 2-/5      Palpation   Palpation comment TTP along B achilles tendons 1-2 cm above insertion     Transfers   Five time sit to stand comments  --     Ambulation/Gait   Ambulation/Gait --   Ambulation Distance (Feet) --   Assistive device --                     [facility] Adult PT Treatment/Exercise - 06/26/16 0001      Ambulation/Gait   Gait Comments --     Exercises   Exercises Ankle     Manual Therapy   Manual Therapy Joint mobilization   Joint Mobilization subtalar jt      Ankle Exercises: Stretches   Plantar Fascia Stretch 2 reps;30 seconds   Slant Board Stretch 3 reps;30 seconds      Ankle Exercises: Standing   SLS bilaterally no UE max of 5 Rt: 5", Lt: 20"   Heel Raises 10 reps   Toe Raise 10 reps   Other Standing Ankle Exercises forward step up x 10 each slow      Ankle Exercises: Seated   Heel Raises 15 reps  not pt has at least 15 degree dorsiflexion on right/20 LT    Toe Raise 15 reps     Ankle Exercises: Supine   Isometrics all x 10    T-Band --  seated instructions for home                PT Education - 06/26/16 0932    Education provided Yes   Education Details Given a HEP   Person(s) Educated Patient   Methods Explanation   Comprehension Verbalized understanding;Returned demonstration          PT Short Term Goals - 06/26/16 0938      PT SHORT TERM GOAL #1   Title Pt will demo consistency and independence with her HEP to improve ankle ROM and strength.    Time 2   Period Weeks   Status On-going     PT SHORT TERM GOAL #2   Title Pt will demo improved pain report to no greater than 4/10 to allow her to resume daily activity without significant discomfort.   Time 4   Period Weeks   Status On-going           PT Long Term Goals - 06/26/16 [id number]      PT LONG TERM GOAL #1   Title Pt will demo improved B ankle ROM to lacking no more than 10 degrees of DF to improve mechanics with ambulation.    Time 8   Period Weeks   Status On-going     PT LONG TERM GOAL #2   Title Pt will demo improved ankle strength in inversion/DF/eversion to 5/5 MMT to improve safety with functional activity.   Time 8   Period Weeks   Status On-going     PT LONG TERM GOAL #3   Title Pt will demo improved gastroc strength to atleast 4/5 MMT to allow her to reach over head for objects at home.    Time 8   Period Weeks   Status On-going     PT LONG TERM GOAL #4   Title Pt will demo improved gait mechanics evident by her ability to ambulate with equal step length and without noted antalgic pattern, x122ft, to allow her to return to treadmill walking at  the [facility] for overall fitness.     Time 8   Period Weeks   Status New     PT LONG TERM GOAL #5   Title Pt will demo improved balace and safety evident by her ability to maintain SLS on each LE for up to 10 sec without LOB, 3/5 trials.    Time 8   Period Weeks   Status New               Plan - 06/26/16 0934    Clinical Impression Statement Pt has had the flu so has not returned since 06/06/2016.  PT reassessed.  She has shown improvement in her ROM in her left leg and general strength but continues to have increased pain.  PT will continue to benefit from siilled physical services.     Rehab Potential Good   PT Frequency 2x / week   PT Duration 8 weeks   PT Treatment/Interventions ADLs/Self Care Home Management;Cryotherapy;Passive range of motion;Dry needling;Manual techniques;Patient/family education;Orthotic Fit/Training;Neuromuscular re-education;Balance training;Gait training;Stair training;Functional mobility training;Therapeutic activities;Therapeutic exercise   PT Next Visit Plan Continue to progress Bilateral ankle strength and stability.     PT Home Exercise Plan Supine ankle PF 2x10 reps, arch lifts seated 10x3 sec  12/19: seated theraband exercises, all directions; isometrics, heelraises and step ups       Patient will benefit from skilled therapeutic intervention in order to improve the following deficits and impairments:  Abnormal gait, Decreased activity tolerance, Decreased strength, Impaired flexibility, Postural dysfunction, Pain, Decreased range of motion, Decreased balance, Decreased mobility, Difficulty walking, Hypomobility  Visit Diagnosis: Pain in right foot  Pain in left foot  Stiffness of right foot, not elsewhere classified  Other abnormalities of gait and mobility  Stiffness of left foot, not elsewhere classified     Problem List Patient Active Problem List   Diagnosis Date Noted  . Abnormal CT scan, colon 05/02/2016  . GERD  (gastroesophageal reflux disease) 05/02/2016  . Hiatal hernia   . History of colonic polyps   . Diverticulosis of colon without hemorrhage   . Constipation 07/22/2014  . History of colon cancer 07/20/2014  . Dysphagia 07/20/2014  . Lower abdominal pain 09/30/2013  . Bipolar affective disorder, current episode depressed ([place]) 08/08/2013  . Anxiety, generalized 08/08/2013  . 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 Noncompliance with medications 08/07/2013  . Colon cancer ([place]) 06/23/2013  . Rectal bleeding 05/09/2013  . Gastritis 05/09/2013  . LLQ pain 11/28/2012  . Hemorrhagic cyst of ovary 11/28/2012  . SHOULDER PAIN, LEFT 05/18/2010  . ACUTE BRONCHITIS 09/03/2008  . UNSPECIFIED URINARY INCONTINENCE 09/03/2008  . ACUTE CYSTITIS 07/19/2008  . Abdominal pain, generalized 07/09/2008  . OBESITY 11/22/2007  . UNSPECIFIED PSYCHOSIS 11/22/2007  . MIGRAINE HEADACHE 11/22/2007  . HYPERTENSION 11/22/2007  . BACK PAIN 11/22/2007  . LATERAL EPICONDYLITIS OF ELBOW 11/22/2007    Rayetta Humphrey, PT CLT [phone] 06/26/2016, 9:45 AM  [facility] [address] [place], [place], [address] Phone: [phone]   Fax:  [phone]  Name: Ashley Patrick MRN: [id number] Date of Birth: 1955/10/28

## 2016-06-26 NOTE — Patient Instructions (Addendum)
Dorsiflexion: Isometric    With ball or rolled pillow between feet, squeeze feet together. Hold _5___ seconds. Relax. Now switch the position of your feet.  Repeat _10___ times per set. Do __1__ sets per session. Do ___2_ sessions per day.  http://orth.exer.us/2   Copyright  VHI. All rights reserved.  Eversion: Isometric    Press outer border of right then left  foot into ball or rolled pillow against wall. Hold ___5_ seconds. Relax. Repeat _10___ times per set. Do _1___ sets per session. Do __2__ sessions per day.  http://orth.exer.us/4   Copyright  VHI. All rights reserved.  Inversion: Isometric    Press inner borders of feet into ball or rolled pillow between feet. Hold __5__ seconds. Relax. Repeat _10___ times per set. Do __1__ sets per session. Do __2__ sessions per day.  http://orth.exer.us/6   Copyright  VHI. All rights reserved.  Plantar Flexion: Isometric    Press left foot into ball or rolled pillow against wall. Hold _5___ seconds. Relax. Repeat to the right  Repeat _10___ times per set. Do __1__ sets per session. Do _2___ sessions per day.  http://orth.exer.us/0   Copyright  VHI. All rights reserved.  Plantar Fascia Stretch    Standing with only ball of left foot on stair, push heel down until stretch is felt through arch of foot.  Repeat to the right Hold _30___ seconds. Relax. Repeat ___3_ times per set. Do __1__ sets per session. Do __2__ sessions per day.  http://orth.exer.us/22   Copyright  VHI. All rights reserved.  Heel Raise: Bilateral (Standing)    Rise on balls of feet. Repeat _10___ times per set. Do __1__ sets per session. Do __2__ sessions per day.  http://orth.exer.us/38   Copyright  VHI. All rights reserved.  Step-Down / Step-Up    Stand on stair step or _4-6___ inch stool. Slowly bend left leg, lowering other foot to floor. Return by straightening front leg. Switch legs  Repeat __10-15__ times per set. Do __1__ sets  per session. Do ___2_ sessions per day.  http://orth.exer.us/684   Copyright  VHI. All rights reserved.

## 2016-06-27 ENCOUNTER — Encounter: Payer: Self-pay | Admitting: Orthopedic Surgery

## 2016-06-27 ENCOUNTER — Ambulatory Visit (INDEPENDENT_AMBULATORY_CARE_PROVIDER_SITE_OTHER): Payer: Medicare Other | Admitting: Orthopedic Surgery

## 2016-06-27 DIAGNOSIS — M7662 Achilles tendinitis, left leg: Secondary | ICD-10-CM | POA: Diagnosis not present

## 2016-06-27 DIAGNOSIS — M25572 Pain in left ankle and joints of left foot: Secondary | ICD-10-CM | POA: Diagnosis not present

## 2016-06-27 DIAGNOSIS — M7661 Achilles tendinitis, right leg: Secondary | ICD-10-CM | POA: Diagnosis not present

## 2016-06-27 MED ORDER — PREDNISONE 10 MG PO TABS: 10.0000 mg | ORAL_TABLET | Freq: Two times a day (BID) | ORAL | 1 refills | Status: DC

## 2016-06-27 NOTE — Progress Notes (Signed)
Patient ID: Ashley Patrick, female   DOB: Dec 31, 1955, 61 y.o.   MRN: [id number]  Chief Complaint  Patient presents with  . Follow-up    BILATERAL ACHILLES TENDINITIS    HPI Ashley Patrick is a 61 y.o. female.  Follow-up visit for bilateral Achilles tendinitis HPI  Ashley Patrick is a 61 y.o. female.  Presented with bilateral ankle pain right greater than left   Complained of moderate to severe aching dull pain behind the heel of both Achilles with swelling painful weight bearing  X 4 MONTHS   Previous treatment includes Naprosyn without relief    Review of Systems Review of Systems  Respiratory: Negative.   Cardiovascular: Negative.   Neurological: Negative for weakness and numbness.     Review of Systems as above    Treatment recommended Recommend switch to prednisone 10 mg twice a day stop Naprosyn   Physical therapy for 6 weeks   Right tall cam walking boot for 6 weeks   Follow-up 6 weeks  Today indicates that: The left side is better but the right side is still painful. She did wear a brace Cam Walker on the left which seem to gave her significant improvement Physical Exam  Today we find that she is well-developed well-nourished female no gross abnormalities or deformities, oriented 3. Mood and affect normal. Gait and station show no significant limp  She has tenderness over the right Achilles tendon and right great toe with bunion deformity. Range of motion ankle joint remains normal ankle remain stable she has no weakness in plantar flexion neurovascular exam is intact MEDICAL DECISION MAKING  DATA   X-rays from the prior visit  DIAGNOSIS  Encounter Diagnoses  Name Primary?  . Achilles tendinitis of both lower extremities Yes  . Acute left ankle pain      PLAN(RISK)    Recommend that she wear the brace boot on the right she can continue prednisone 10 mg. Follow-up one

## 2016-06-27 NOTE — Patient Instructions (Signed)
Wear boot on right  Continue prednisone

## 2016-06-28 ENCOUNTER — Ambulatory Visit (HOSPITAL_COMMUNITY): Payer: Medicare Other

## 2016-06-28 DIAGNOSIS — M79672 Pain in left foot: Secondary | ICD-10-CM

## 2016-06-28 DIAGNOSIS — R2689 Other abnormalities of gait and mobility: Secondary | ICD-10-CM

## 2016-06-28 DIAGNOSIS — M25675 Stiffness of left foot, not elsewhere classified: Secondary | ICD-10-CM

## 2016-06-28 DIAGNOSIS — M79671 Pain in right foot: Secondary | ICD-10-CM

## 2016-06-28 DIAGNOSIS — M25674 Stiffness of right foot, not elsewhere classified: Secondary | ICD-10-CM | POA: Diagnosis not present

## 2016-06-28 NOTE — Therapy (Signed)
[facility] [address] [place], [place], [address] Phone: [phone]   Fax:  [phone]  Physical Therapy Treatment  Patient Details  Name: Ashley Patrick MRN: [id number] Date of Birth: 1956-01-28 Referring Provider: Arther Abbott   Encounter Date: 06/28/2016      PT End of Session - 06/28/16 0927    Visit Number 5   Number of Visits 17   Date for PT Re-Evaluation 07/24/16   Authorization Type UHC medicare; reassess done visit 4   Authorization Time Period 05/29/16 to 07/24/16   Authorization - Visit Number 5   Authorization - Number of Visits 10   PT Start Time 0904   PT Stop Time 0942   PT Time Calculation (min) 38 min   Activity Tolerance Patient tolerated treatment well;No increased pain   Behavior During Therapy WFL for tasks assessed/performed      Past Medical History:  Diagnosis Date  . Back pain   . Bipolar 1 disorder ([place])   . Cancer [facility]) 2014   Colon Cancer  . Hypercholesteremia   . Hypertension   . Lateral epicondylitis  of elbow    right  . Migraine headache   . Nicotine addiction   . Obesity    History of  . OD (overdose of drug)    hospitalized for 11 days   . Psychosis     Past Surgical History:  Procedure Laterality Date  . ABDOMINAL HYSTERECTOMY    . BIOPSY N/A 08/13/2014   Procedure: BIOPSY;  Surgeon: Daneil Dolin, MD;  Location: [facility];  Service: Endoscopy;  Laterality: N/A;  . BREAST BIOPSY  10/18/2011   Procedure: BREAST BIOPSY;  Surgeon: Jamesetta So, MD;  Location: [facility];  Service: General;  Laterality: Left;  . COLON RESECTION N/A 06/23/2013   Procedure: HAND ASSISTED LAPAROSCOPIC PARTIAL COLECTOMY;  Surgeon: Jamesetta So, MD;  Location: [facility] ORS;  Service: General;  Laterality: N/A;  . COLONOSCOPY    . COLONOSCOPY N/A 05/29/2013   [id number] mass most likely representing colorectal cancer S/ P biospy.Multiple colonic and rectal polyps removed/treated as described above. Colonic  diverticulosis  . COLONOSCOPY WITH PROPOFOL N/A 08/13/2014   RMR: Status post sigmoid colectomy. Multiple colonic polyps removed as described above. Redundant colon. Pan colonic diverticuloisi  . COLONOSCOPY WITH PROPOFOL N/A 05/22/2016   Procedure: COLONOSCOPY WITH PROPOFOL;  Surgeon: Daneil Dolin, MD;  Location[facility];  Service: Endoscopy;  Laterality: N/A;  7:30 am  . ESOPHAGEAL DILATION N/A 08/13/2014   Procedure: [place];  Surgeon: Daneil Dolin, MD;  Location: [facility] OR[facility];  Service: Endoscopy;  Laterality: N/A;  . ESOPHAGOGASTRODUODENOSCOPY N/A 12/26/2012   [id number] reflux esophagitis. Gastric and duodenal bulbar erosions-status post gastric biopsynegative H.pylori  . ESOPHAGOGASTRODUODENOSCOPY (EGD) WITH PROPOFOL N/A 08/13/2014   RMR: Small benign cystic-appearing lesion in distal esophagus of doubtful clinical significance, otherwise normal esophagus, status post Maloney dilation. Small hiatal hernia, some retained gastric contents (query delayed gastric emptying.)  . partial hysterectomy    . POLYPECTOMY N/A 08/13/2014   Procedure: POLYPECTOMY;  Surgeon: Daneil Dolin, MD;  Location: [facility];  Service: Endoscopy;  Laterality: N/A;    There were no vitals filed for this visit.      Subjective Assessment - 06/28/16 0909    Subjective Went to MD yesterday and encouraged pt to begin wearing boot on Rt foot.  Reports Lt leg if feeling good, does have some tenderness on Rt  foot pain scale 5/10.   Pertinent History Back pain, bipolar disorder, HTN, obesity, psychosis   Diagnostic tests Xray: Haglund's type shape to the calcaneus. Calcification in the Achilles tendon insertion. Swelling of the soft tissues of the retro calcaneal bursa.   Patient Stated Goals decrease pain    Currently in Pain? Yes   Pain Score 5    Pain Location Foot   Pain Orientation Right   Pain Descriptors / Indicators Aching   Pain Type Chronic pain   Pain Onset 1 to 4 weeks ago    Pain Frequency Constant   Aggravating Factors  standing   Pain Relieving Factors icing   Effect of Pain on Daily Activities decreased ability to complete standing activites                         [facility] Adult PT Treatment/Exercise - 06/28/16 0001      Ankle Exercises: Stretches   Plantar Fascia Stretch 3 reps;30 seconds   Slant Board Stretch 3 reps;30 seconds     Ankle Exercises: Standing   SLS Rt 40". Lt 20" max of3   Heel Raises 15 reps   Toe Raise 15 reps   Other Standing Ankle Exercises forward step up 6in x 15 each slow    Other Standing Ankle Exercises tandem stance 3x 30"                  PT Short Term Goals - 06/26/16 [id number]      PT SHORT TERM GOAL #1   Title Pt will demo consistency and independence with her HEP to improve ankle ROM and strength.    Time 2   Period Weeks   Status On-going     PT SHORT TERM GOAL #2   Title Pt will demo improved pain report to no greater than 4/10 to allow her to resume daily activity without significant discomfort.   Time 4   Period Weeks   Status On-going           PT Long Term Goals - 06/26/16 [id number]      PT LONG TERM GOAL #1   Title Pt will demo improved B ankle ROM to lacking no more than 10 degrees of DF to improve mechanics with ambulation.    Time 8   Period Weeks   Status On-going     PT LONG TERM GOAL #2   Title Pt will demo improved ankle strength in inversion/DF/eversion to 5/5 MMT to improve safety with functional activity.   Time 8   Period Weeks   Status On-going     PT LONG TERM GOAL #3   Title Pt will demo improved gastroc strength to atleast 4/5 MMT to allow her to reach over head for objects at home.    Time 8   Period Weeks   Status On-going     PT LONG TERM GOAL #4   Title Pt will demo improved gait mechanics evident by her ability to ambulate with equal step length and without noted antalgic pattern, x152ft, to allow her to return to treadmill walking at the [facility] for  overall fitness.     Time 8   Period Weeks   Status New     PT LONG TERM GOAL #5   Title Pt will demo improved balace and safety evident by her ability to maintain SLS on each LE for up to 10 sec without LOB, 3/5 trials.    Time  [place] - 06/28/16 0935    Clinical Impression Statement Session focus on improving ankle/LE strength and stability.  Pt able to tolerate increased reps with standing exercises and presents with improved SLS Bil LE.  Added static balance activties for stabilty.  No reports of increased pain through session.     Rehab Potential Good   PT Frequency 2x / week   PT Duration 8 weeks   PT Treatment/Interventions ADLs/Self Care Home Management;Cryotherapy;Passive range of motion;Dry needling;Manual techniques;Patient/family education;Orthotic Fit/Training;Neuromuscular re-education;Balance training;Gait training;Stair training;Functional mobility training;Therapeutic activities;Therapeutic exercise   PT Next Visit Plan Continue to progress Bilateral ankle strength and stability.        Patient will benefit from skilled therapeutic intervention in order to improve the following deficits and impairments:  Abnormal gait, Decreased activity tolerance, Decreased strength, Impaired flexibility, Postural dysfunction, Pain, Decreased range of motion, Decreased balance, Decreased mobility, Difficulty walking, Hypomobility  Visit Diagnosis: Pain in right foot  Pain in left foot  Stiffness of right foot, not elsewhere classified  Other abnormalities of gait and mobility  Stiffness of left foot, not elsewhere classified     Problem List Patient Active Problem List   Diagnosis Date Noted  . Abnormal CT scan, colon 05/02/2016  . GERD (gastroesophageal reflux disease) 05/02/2016  . Hiatal hernia   . History of colonic polyps   . Diverticulosis of colon without hemorrhage   . Constipation 07/22/2014  . History of colon  cancer 07/20/2014  . Dysphagia 07/20/2014  . Lower abdominal pain 09/30/2013  . Bipolar affective disorder, current episode depressed ([place]) 08/08/2013  . Anxiety, generalized 08/08/2013  . 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 Noncompliance with medications 08/07/2013  . Colon cancer ([place]) 06/23/2013  . Rectal bleeding 05/09/2013  . Gastritis 05/09/2013  . LLQ pain 11/28/2012  . Hemorrhagic cyst of ovary 11/28/2012  . SHOULDER PAIN, LEFT 05/18/2010  . ACUTE BRONCHITIS 09/03/2008  . UNSPECIFIED URINARY INCONTINENCE 09/03/2008  . ACUTE CYSTITIS 07/19/2008  . Abdominal pain, generalized 07/09/2008  . OBESITY 11/22/2007  . UNSPECIFIED PSYCHOSIS 11/22/2007  . MIGRAINE HEADACHE 11/22/2007  . HYPERTENSION 11/22/2007  . BACK PAIN 11/22/2007  . LATERAL EPICONDYLITIS OF ELBOW 11/22/2007   Ihor [place], [place]; [place]  Aldona Lento 06/28/2016, 9:45 AM  [place], [place], [address] Phone: [phone]   Fax:  [phone]  Name: Ashley Patrick MRN: [id number] Date of Birth: 05-11-1956

## 2016-07-03 ENCOUNTER — Ambulatory Visit (HOSPITAL_COMMUNITY): Payer: Medicare Other | Admitting: Physical Therapy

## 2016-07-03 ENCOUNTER — Telehealth (HOSPITAL_COMMUNITY): Payer: Self-pay | Admitting: Physical Therapy

## 2016-07-03 NOTE — Telephone Encounter (Signed)
No Show. Spoke with pt who states her appointment slipped her mind. Reminded of next appointment and she needed to cancel. She confirmed the following appointment on 07/10/16 at [facility].  5:34 PM,07/03/16 Ashley Patrick PT, DPT [facility] [phone]

## 2016-07-05 ENCOUNTER — Encounter (HOSPITAL_COMMUNITY): Payer: Self-pay

## 2016-07-10 ENCOUNTER — Encounter (HOSPITAL_COMMUNITY): Payer: Self-pay | Admitting: Physical Therapy

## 2016-07-10 ENCOUNTER — Telehealth (HOSPITAL_COMMUNITY): Payer: Self-pay | Admitting: Internal Medicine

## 2016-07-10 NOTE — Telephone Encounter (Signed)
07/10/16 pt cx because therapist out sick and was rescheduled for 1/26 and was ok with doing this

## 2016-07-12 ENCOUNTER — Ambulatory Visit (HOSPITAL_COMMUNITY): Payer: Medicare Other | Admitting: Physical Therapy

## 2016-07-12 DIAGNOSIS — M79671 Pain in right foot: Secondary | ICD-10-CM | POA: Diagnosis not present

## 2016-07-12 DIAGNOSIS — M79672 Pain in left foot: Secondary | ICD-10-CM

## 2016-07-12 DIAGNOSIS — R2689 Other abnormalities of gait and mobility: Secondary | ICD-10-CM

## 2016-07-12 DIAGNOSIS — M25675 Stiffness of left foot, not elsewhere classified: Secondary | ICD-10-CM | POA: Diagnosis not present

## 2016-07-12 DIAGNOSIS — M25674 Stiffness of right foot, not elsewhere classified: Secondary | ICD-10-CM | POA: Diagnosis not present

## 2016-07-12 NOTE — Therapy (Signed)
[facility] [address] [place], [place], [address] Phone: [phone]   Fax:  [phone]  Physical Therapy Treatment  Patient Details  Name: Ashley Patrick MRN: [id number] Date of Birth: 1955/11/30 Referring Provider: Fuller [place]   Encounter Date: 07/12/2016      PT End of Session - 07/12/16 1137    Visit Number 6   Number of Visits 17   Date for PT Re-Evaluation 07/24/16   Authorization Type UHC medicare; reassess done visit 4   Authorization Time Period 05/29/16 to 07/24/16   Authorization - Visit Number 6   Authorization - Number of Visits 10   PT Start Time 0903   PT Stop Time 0944   PT Time Calculation (min) 41 min   Activity Tolerance Patient tolerated treatment well;No increased pain   Behavior During Therapy WFL for tasks assessed/performed      Past Medical History:  Diagnosis Date  . Back pain   . Bipolar 1 disorder (HCC)   . Cancer [facility]) 2014   Colon Cancer  . Hypercholesteremia   . Hypertension   . Lateral epicondylitis  of elbow    right  . Migraine headache   . Nicotine addiction   . Obesity    History of  . OD (overdose of drug)    hospitalized for 11 days   . Psychosis     Past Surgical History:  Procedure Laterality Date  . ABDOMINAL HYSTERECTOMY    . BIOPSY N/A 08/13/2014   Procedure: BIOPSY;  Surgeon: Corbin Ade, MD;  Location: [facility];  Service: Endoscopy;  Laterality: N/A;  . BREAST BIOPSY  10/18/2011   Procedure: BREAST BIOPSY;  Surgeon: Dalia Heading, MD;  Location: [facility];  Service: General;  Laterality: Left;  . COLON RESECTION N/A 06/23/2013   Procedure: HAND ASSISTED LAPAROSCOPIC PARTIAL COLECTOMY;  Surgeon: Dalia Heading, MD;  Location: [facility];  Service: General;  Laterality: N/A;  . COLONOSCOPY    . COLONOSCOPY N/A 05/29/2013   [id number] mass most likely representing colorectal cancer S/ P biospy.Multiple colonic and rectal polyps removed/treated as described above. Colonic  diverticulosis  . COLONOSCOPY WITH PROPOFOL N/A 08/13/2014   RMR: Status post sigmoid colectomy. Multiple colonic polyps removed as described above. Redundant colon. Pan colonic diverticuloisi  . COLONOSCOPY WITH PROPOFOL N/A 05/22/2016   Procedure: COLONOSCOPY WITH PROPOFOL;  Surgeon: Corbin Ade, MD;  [facility];  Service: Endoscopy;  Laterality: N/A;  7:30 am  . ESOPHAGEAL DILATION N/A 08/13/2014   Procedure: ESOPHAGEAL MALONEY DILATION 54 [address]CH;  Surgeon: Corbin Ade, MD;  Location: [facility];  Service: Endoscopy;  Laterality: N/A;  . ESOPHAGOGASTRODUODENOSCOPY N/A 12/26/2012   LQS:XOUJHHZ reflux esophagitis. Gastric and duodenal bulbar erosions-status post gastric biopsynegative H.pylori  . ESOPHAGOGASTRODUODENOSCOPY (EGD) WITH PROPOFOL N/A 08/13/2014   RMR: Small benign cystic-appearing lesion in distal esophagus of doubtful clinical significance, otherwise normal esophagus, status post Maloney dilation. Small hiatal hernia, some retained gastric contents (query delayed gastric emptying.)  . partial hysterectomy    . POLYPECTOMY N/A 08/13/2014   Procedure: POLYPECTOMY;  Surgeon: Corbin Ade, MD;  Location: [facility];  Service: Endoscopy;  Laterality: N/A;    There were no vitals filed for this visit.      Subjective Assessment - 07/12/16 0904    Subjective Pt states that things are going well. Her left foot is doing great and her Rt achilles area is what is bothering her most. She is wearing  her boot about 3 horus a day and her exercises are going well.    Pertinent History Back pain, bipolar disorder, HTN, obesity, psychosis   Diagnostic tests Xray: Haglund's type shape to the calcaneus. Calcification in the Achilles tendon insertion. Swelling of the soft tissues of the retro calcaneal bursa.   Patient Stated Goals decrease pain    Currently in Pain? Yes   Pain Score 2    Pain Location Heel   Pain Orientation Right   Pain Descriptors / Indicators Dull   Pain Type  Chronic pain   Pain Onset 1 to 4 weeks ago   Pain Frequency Intermittent   Aggravating Factors  trying to mop or vacuum around her house.   Pain Relieving Factors rest   Effect of Pain on Daily Activities limited tolerance to house activity                         [facility] Adult PT Treatment/Exercise - 07/12/16 0001      Ankle Exercises: Supine   T-Band x20 reps each LE, PF/inv/ever/DF using blue TB     Ankle Exercises: Seated   Toe Raise 2 seconds;Other (comment)  1st toe only   BAPS Sitting;Other (comment)  x1 min CW/CCW   Other Seated Ankle Exercises gross toe abduction x15 reps, BLE   Other Seated Ankle Exercises toes 2-4 raise, x15 reps      Ankle Exercises: Standing   Heel Raises 15 reps;Other (comment)  x2 sets    Other Standing Ankle Exercises SLS on each LE 3x30 sec on firm surface    Other Standing Ankle Exercises tandem stance with each LE forward 3x30 sec on foam                 PT Education - 07/12/16 1136    Education provided Yes   Education Details technique with therex; noted improvements and upcoming reassessment for possible d/c or extension of POC   Person(s) Educated Patient   Methods Explanation;Demonstration;Verbal cues   Comprehension Verbalized understanding;Returned demonstration          PT Short Term Goals - 06/26/16 0938      PT SHORT TERM GOAL #1   Title Pt will demo consistency and independence with her HEP to improve ankle ROM and strength.    Time 2   Period Weeks   Status On-going     PT SHORT TERM GOAL #2   Title Pt will demo improved pain report to no greater than 4/10 to allow her to resume daily activity without significant discomfort.   Time 4   Period Weeks   Status On-going           PT Long Term Goals - 06/26/16 1564      PT LONG TERM GOAL #1   Title Pt will demo improved B ankle ROM to lacking no more than 10 degrees of DF to improve mechanics with ambulation.    Time 8   Period Weeks    Status On-going     PT LONG TERM GOAL #2   Title Pt will demo improved ankle strength in inversion/DF/eversion to 5/5 MMT to improve safety with functional activity.   Time 8   Period Weeks   Status On-going     PT LONG TERM GOAL #3   Title Pt will demo improved gastroc strength to atleast 4/5 MMT to allow her to reach over head for objects at home.    Time  8   Period Weeks   Status On-going     PT LONG TERM GOAL #4   Title Pt will demo improved gait mechanics evident by her ability to ambulate with equal step length and without noted antalgic pattern, x146f, to allow [facility]     Time 8   Period Weeks   Status New     PT LONG TERM GOAL #5   Title Pt will demo improved balace and safety evident by her ability to maintain SLS on each LE for up to 10 sec without LOB, 3/5 trials.    Time 8   Period Weeks   Status New               Plan - 002/08/181210    Clinical Impression Statement Today's session continued with therex to improve ankle and foot strength. Pt was able to perform ankle exercises with increased resistance and reps, however she demonstrates increased difficulty with Rt foot intrinsic muscle activation. Discussed upcoming re-evaluation and possibility of d/c if pt demonstrates goals met and independence with a detailed HEP for continued maintenance at home. She verbalized understanding and agreement at this time.   Rehab Potential Good   PT Frequency 2x / week   PT Duration 8 weeks   PT Treatment/Interventions ADLs/Self Care Home Management;Cryotherapy;Passive range of motion;Dry needling;Manual techniques;Patient/family education;Orthotic Fit/Training;Neuromuscular re-education;Balance training;Gait training;Stair training;Functional mobility training;Therapeutic activities;Therapeutic exercise   PT Next Visit Plan standing calf raises, ankle 4 way with increased sets (blue TB), gastroc stretch    PT Home  Exercise Plan Supine ankle PF 2x10 reps, arch lifts seated 10x3 sec  12/19: seated theraband exercises, all directions; isometrics, heelraises and step ups    Consulted and Agree with Plan of Care Patient      Patient will benefit from skilled therapeutic intervention in order to improve the following deficits and impairments:  Abnormal gait, Decreased activity tolerance, Decreased strength, Impaired flexibility, Postural dysfunction, Pain, Decreased range of motion, Decreased balance, Decreased mobility, Difficulty walking, Hypomobility  Visit Diagnosis: Pain in right foot  Pain in left foot  Stiffness of right foot, not elsewhere classified  Other abnormalities of gait and mobility  Stiffness of left foot, not elsewhere classified       G-Codes - 002/08/20181232    Functional Assessment Tool Used Clinical judgement based on assessment of activity tolerance, balance and mobility.   Functional Limitation Mobility: Walking and moving around   Mobility: Walking and Moving Around Current Status [phone] At least 20 percent but less than 40 percent impaired, limited or restricted   Mobility: Walking and Moving Around Goal Status [phone] At least 20 percent but less than 40 percent impaired, limited or restricted      Problem List Patient Active Problem List   Diagnosis Date Noted  . Abnormal CT scan, colon 05/02/2016  . GERD (gastroesophageal reflux disease) 05/02/2016  . Hiatal hernia   . History of colonic polyps   . Diverticulosis of colon without hemorrhage   . Constipation 07/22/2014  . History of colon cancer 07/20/2014  . Dysphagia 07/20/2014  . Lower abdominal pain 09/30/2013  . Bipolar affective disorder, current episode depressed ([place] 08/08/2013  . Anxiety, generalized 08/08/2013  . 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 Noncompliance with medications 08/07/2013  . Colon cancer ([place] 06/23/2013  . Rectal bleeding 05/09/2013  .  Gastritis 05/09/2013  . LLQ pain 11/28/2012  .  Hemorrhagic cyst of ovary 11/28/2012  . SHOULDER PAIN, LEFT 05/18/2010  . ACUTE BRONCHITIS 09/03/2008  . UNSPECIFIED URINARY INCONTINENCE 09/03/2008  . ACUTE CYSTITIS 07/19/2008  . Abdominal pain, generalized 07/09/2008  . OBESITY 11/22/2007  . UNSPECIFIED PSYCHOSIS 11/22/2007  . MIGRAINE HEADACHE 11/22/2007  . HYPERTENSION 11/22/2007  . BACK PAIN 11/22/2007  . LATERAL EPICONDYLITIS OF ELBOW 11/22/2007    12:38 PM,07/12/16 Elly Modena PT, DPT [facility] [address] [place], [place], [address] Phone: [phone]   Fax:  [phone]  Name: Ashley Patrick MRN: [id number] Date of Birth: [birth year]

## 2016-07-13 DIAGNOSIS — N390 Urinary tract infection, site not specified: Secondary | ICD-10-CM | POA: Diagnosis not present

## 2016-07-14 ENCOUNTER — Ambulatory Visit (HOSPITAL_COMMUNITY): Payer: Medicare Other | Admitting: Physical Therapy

## 2016-07-17 ENCOUNTER — Ambulatory Visit (HOSPITAL_COMMUNITY): Payer: Medicare Other | Admitting: Physical Therapy

## 2016-07-17 DIAGNOSIS — M25674 Stiffness of right foot, not elsewhere classified: Secondary | ICD-10-CM | POA: Diagnosis not present

## 2016-07-17 DIAGNOSIS — M79671 Pain in right foot: Secondary | ICD-10-CM | POA: Diagnosis not present

## 2016-07-17 DIAGNOSIS — M79672 Pain in left foot: Secondary | ICD-10-CM

## 2016-07-17 DIAGNOSIS — M25675 Stiffness of left foot, not elsewhere classified: Secondary | ICD-10-CM

## 2016-07-17 DIAGNOSIS — R2689 Other abnormalities of gait and mobility: Secondary | ICD-10-CM

## 2016-07-17 NOTE — Therapy (Signed)
[place] [place] [address] [place], [place], [address] Phone: [phone]   Fax:  [phone]  Physical Therapy Treatment  Patient Details  Name: Ashley Patrick MRN: [id number] Date of Birth: 06-22-1955 Referring Provider: Arther Abbott   Encounter Date: 07/17/2016      PT End of Session - 07/17/16 1339    Visit Number 7   Number of Visits 17   Date for PT Re-Evaluation 07/24/16   Authorization Type [employer] medicare; reassess done visit 4   Authorization Time Period 05/29/16 to 07/24/16   Authorization - Visit Number 7   Authorization - Number of Visits 10   PT Start Time 1300   PT Stop Time [id number]   PT Time Calculation (min) 38 min   Activity Tolerance Patient tolerated treatment well;No increased pain   Behavior During Therapy WFL for tasks assessed/performed      Past Medical History:  Diagnosis Date  . Back pain   . Bipolar 1 disorder ([place])   . Cancer [facility]) 2014   Colon Cancer  . Hypercholesteremia   . Hypertension   . Lateral epicondylitis  of elbow    right  . Migraine headache   . Nicotine addiction   . Obesity    History of  . OD (overdose of drug)    hospitalized for 11 days   . Psychosis     Past Surgical History:  Procedure Laterality Date  . ABDOMINAL HYSTERECTOMY    . BIOPSY N/A 08/13/2014   Procedure: BIOPSY;  Surgeon: Daneil Dolin, MD;  Location: [facility];  Service: Endoscopy;  Laterality: N/A;  . BREAST BIOPSY  10/18/2011   Procedure: BREAST BIOPSY;  Surgeon: Jamesetta So, MD;  Location: [facility];  Service: General;  Laterality: Left;  . COLON RESECTION N/A 06/23/2013   Procedure: HAND ASSISTED LAPAROSCOPIC PARTIAL COLECTOMY;  Surgeon: Jamesetta So, MD;  Location: [facility];  Service: General;  Laterality: N/A;  . COLONOSCOPY    . COLONOSCOPY N/A 05/29/2013   [id number] mass most likely representing colorectal cancer S/ P biospy.Multiple colonic and rectal polyps removed/treated as described above. Colonic  diverticulosis  . COLONOSCOPY WITH PROPOFOL N/A 08/13/2014   RMR: Status post sigmoid colectomy. Multiple colonic polyps removed as described above. Redundant colon. Pan colonic diverticuloisi  . COLONOSCOPY WITH PROPOFOL N/A 05/22/2016   Procedure: COLONOSCOPY WITH PROPOFOL;  Surgeon: Daneil Dolin, MD;  [facility]: [facility];  Service: Endoscopy;  Laterality: N/A;  7:30 am  . ESOPHAGEAL DILATION N/A 08/13/2014   Procedure: [place];  Surgeon: Daneil Dolin, MD;  Location: [facility];  Service: Endoscopy;  Laterality: N/A;  . ESOPHAGOGASTRODUODENOSCOPY N/A 12/26/2012   [id number] reflux esophagitis. Gastric and duodenal bulbar erosions-status post gastric biopsynegative H.pylori  . ESOPHAGOGASTRODUODENOSCOPY (EGD) WITH PROPOFOL N/A 08/13/2014   RMR: Small benign cystic-appearing lesion in distal esophagus of doubtful clinical significance, otherwise normal esophagus, status post Maloney dilation. Small hiatal hernia, some retained gastric contents (query delayed gastric emptying.)  . partial hysterectomy    . POLYPECTOMY N/A 08/13/2014   Procedure: POLYPECTOMY;  Surgeon: Daneil Dolin, MD;  Location: [facility];  Service: Endoscopy;  Laterality: N/A;    There were no vitals filed for this visit.      Subjective Assessment - 07/17/16 1303    Subjective Patient arrives today stating she is doing well; no pain or major changes since last session    Pertinent History Back pain, bipolar disorder, HTN, obesity,  psychosis   Currently in Pain? No/denies                         [facility] Adult PT Treatment/Exercise - 07/17/16 0001      Ankle Exercises: Standing   Rocker Board 2 minutes;Other (comment)  AP and lateral   Heel Raises 15 reps;Other (comment)  2 sets    Toe Raise 20 reps  2 sets    Heel Walk (Round Trip) 4x67ft    Toe Walk (Round Trip) 4x76ft    Other Standing Ankle Exercises seated: arch crunches 1x10 each, tactile cues      Ankle  Exercises: Stretches   Plantar Fascia Stretch 3 reps;30 seconds   Soleus Stretch 3 reps;30 seconds   Slant Board Stretch 3 reps;30 seconds             Balance Exercises - 07/17/16 1307      Balance Exercises: Standing   Standing Eyes Opened Narrow base of support (BOS);Foam/compliant surface;3 reps;30 secs   Tandem Stance Eyes open;3 reps;30 secs   SLS Eyes open;Solid surface;3 reps;30 secs           PT Education - 07/17/16 1339    Education provided Yes   Education Details re-assess with evaluating PT on 2/5, possible DOMS    Person(s) Educated Patient   Methods Explanation   Comprehension Verbalized understanding          PT Short Term Goals - 06/26/16 [id number]      PT SHORT TERM GOAL #1   Title Pt will demo consistency and independence with her HEP to improve ankle ROM and strength.    Time 2   Period Weeks   Status On-going     PT SHORT TERM GOAL #2   Title Pt will demo improved pain report to no greater than 4/10 to allow her to resume daily activity without significant discomfort.   Time 4   Period Weeks   Status On-going           PT Long Term Goals - 06/26/16 [id number]      PT LONG TERM GOAL #1   Title Pt will demo improved B ankle ROM to lacking no more than 10 degrees of DF to improve mechanics with ambulation.    Time 8   Period Weeks   Status On-going     PT LONG TERM GOAL #2   Title Pt will demo improved ankle strength in inversion/DF/eversion to 5/5 MMT to improve safety with functional activity.   Time 8   Period Weeks   Status On-going     PT LONG TERM GOAL #3   Title Pt will demo improved gastroc strength to atleast 4/5 MMT to allow her to reach over head for objects at home.    Time 8   Period Weeks   Status On-going     PT LONG TERM GOAL #4   Title Pt will demo improved gait mechanics evident by her ability to ambulate with equal step length and without noted antalgic pattern, x111ft, to allow her to return to treadmill walking at  the [facility] for overall fitness.     Time 8   Period Weeks   Status New     PT LONG TERM GOAL #5   Title Pt will demo improved balace and safety evident by her ability to maintain SLS on each LE for up to 10 sec without LOB, 3/5 trials.    Time 8  Period Weeks   Status New               Plan - 07/17/16 1340    Clinical Impression Statement Focused on standing exercises today per advice/POC of evaluating DPT; monitored patient closely for increased pain and loading secondary to increased exercise loading today, also worked on balance work in standing in parallel bars for safety. Patient appears to be doing quite well at this time, unable to exacerbate pain with activities performed today. Recommend re-assess with evaluating PT next session.    Rehab Potential Good   PT Frequency 2x / week   PT Duration 8 weeks   PT Treatment/Interventions ADLs/Self Care Home Management;Cryotherapy;Passive range of motion;Dry needling;Manual techniques;Patient/family education;Orthotic Fit/Training;Neuromuscular re-education;Balance training;Gait training;Stair training;Functional mobility training;Therapeutic activities;Therapeutic exercise   PT Next Visit Plan re-assess/mini-reassess with evaluating DPT, POC adjustment as needed    PT Home Exercise Plan Supine ankle PF 2x10 reps, arch lifts seated 10x3 sec  12/19: seated theraband exercises, all directions; isometrics, heelraises and step ups    Consulted and Agree with Plan of Care Patient      Patient will benefit from skilled therapeutic intervention in order to improve the following deficits and impairments:  Abnormal gait, Decreased activity tolerance, Decreased strength, Impaired flexibility, Postural dysfunction, Pain, Decreased range of motion, Decreased balance, Decreased mobility, Difficulty walking, Hypomobility  Visit Diagnosis: Pain in right foot  Pain in left foot  Stiffness of right foot, not elsewhere classified  Other  abnormalities of gait and mobility  Stiffness of left foot, not elsewhere classified     Problem List Patient Active Problem List   Diagnosis Date Noted  . Abnormal CT scan, colon 05/02/2016  . GERD (gastroesophageal reflux disease) 05/02/2016  . Hiatal hernia   . History of colonic polyps   . Diverticulosis of colon without hemorrhage   . Constipation 07/22/2014  . History of colon cancer 07/20/2014  . Dysphagia 07/20/2014  . Lower abdominal pain 09/30/2013  . Bipolar affective disorder, current episode depressed ([place]) 08/08/2013  . Anxiety, generalized 08/08/2013  . 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 Noncompliance with medications 08/07/2013  . Colon cancer ([place]) 06/23/2013  . Rectal bleeding 05/09/2013  . Gastritis 05/09/2013  . LLQ pain 11/28/2012  . Hemorrhagic cyst of ovary 11/28/2012  . SHOULDER PAIN, LEFT 05/18/2010  . ACUTE BRONCHITIS 09/03/2008  . UNSPECIFIED URINARY INCONTINENCE 09/03/2008  . ACUTE CYSTITIS 07/19/2008  . Abdominal pain, generalized 07/09/2008  . OBESITY 11/22/2007  . UNSPECIFIED PSYCHOSIS 11/22/2007  . MIGRAINE HEADACHE 11/22/2007  . HYPERTENSION 11/22/2007  . BACK PAIN 11/22/2007  . LATERAL EPICONDYLITIS OF ELBOW 11/22/2007    Deniece Ree PT, DPT [place] [address] [place], [place], [address] Phone: [phone]   Fax:  [phone]  Name: Ashley Patrick MRN: [id number] Date of Birth: [birth year]

## 2016-07-24 ENCOUNTER — Ambulatory Visit (HOSPITAL_COMMUNITY): Payer: Medicare Other | Attending: Orthopedic Surgery | Admitting: Physical Therapy

## 2016-07-25 ENCOUNTER — Ambulatory Visit: Payer: Self-pay | Admitting: Orthopedic Surgery

## 2016-09-13 ENCOUNTER — Ambulatory Visit (INDEPENDENT_AMBULATORY_CARE_PROVIDER_SITE_OTHER): Payer: Medicare Other | Admitting: Gastroenterology

## 2016-09-13 ENCOUNTER — Encounter: Payer: Self-pay | Admitting: Gastroenterology

## 2016-09-13 VITALS — BP 112/79 | HR 108 | Temp 98.2°F | Ht 66.0 in | Wt 181.8 lb

## 2016-09-13 DIAGNOSIS — K5900 Constipation, unspecified: Secondary | ICD-10-CM | POA: Diagnosis not present

## 2016-09-13 DIAGNOSIS — Z86010 Personal history of colonic polyps: Secondary | ICD-10-CM

## 2016-09-13 DIAGNOSIS — Z85038 Personal history of other malignant neoplasm of large intestine: Secondary | ICD-10-CM | POA: Diagnosis not present

## 2016-09-13 DIAGNOSIS — Z716 Tobacco abuse counseling: Secondary | ICD-10-CM | POA: Diagnosis not present

## 2016-09-13 NOTE — Assessment & Plan Note (Signed)
Patient voiced interested for smoking cessation. Has tried nicotine patches/gum, praying, going cold [place]. Encouraged her to follow-up with PCP to discuss possibility of Chantix. Provided her with a 1 800 quit now information.

## 2016-09-13 NOTE — Progress Notes (Signed)
cc'ed to pcp °

## 2016-09-13 NOTE — Progress Notes (Signed)
Primary Care Physician: Jani Gravel, MD  Primary Gastroenterologist:  Garfield Cornea, MD   Chief Complaint  Patient presents with  . Gastroesophageal Reflux    f/u    HPI: Ashley Patrick is a 61 y.o. female here for follow-up. Last seen in November 2017. 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 She had a partial colectomy January 2015.  Tumor staged at T2, N0, M0. Seen by oncology 07/21/13 with no additional treatment indicated, and colonoscopy 1 year and if no polyps repeat every 3 years. No follow-up appt with oncology made.  EGD done in February 2016, minimal retained gastric contents that exam was thorough. Small benign cystic-appearing lesion in the distal esophagus, small hiatal hernia.  Colonoscopy December 2017 with multiple tubular adenomas removedEvidence of prior surgical anastomosis in the sigmoid colon. Appeared patent,? Somewhat narrowed just proximal anastomosis. Follow-up barium enema to further evaluate anastomosis showed no anastomotic stricture. Next colonoscopy planned December 2020.  Overall feeling well. Did not like Linzess. Taking daily call significantly stools but if she didn't take it daily didn't seem to work. Likes her current regimen. Mag citrate when really bloating and backed up maybe twice per month. Stool softener every day, (2 daily). Having to avoid certain foods due to constipation. Pain medication on occasion. No melena. No brbpr.  No heartburn off PPI. No dysphagia, vomiting. Her weight fluctuates quite a bit. Similar to that back in October however she did get up to 189 pounds in November. Weight 198 in January, question error.    Current Outpatient Prescriptions  Medication Sig Dispense Refill  . amLODipine-benazepril (LOTREL) 10-20 MG per capsule Take 1 capsule by mouth daily.      . Cholecalciferol (VITAMIN D3) 2000 UNITS TABS Take 1 tablet by mouth daily.    . clonazePAM (KLONOPIN) 1 MG tablet Take 1 mg by mouth 3 (three) times daily.     Mariane Baumgarten Calcium (STOOL SOFTENER PO) Take 2 tablets by mouth daily.    . fluticasone (FLONASE) 50 MCG/ACT nasal spray Place 1 spray into both nostrils daily as needed for allergies.     . hydrochlorothiazide (HYDRODIURIL) 50 MG tablet Take 50 mg by mouth daily.    Marland Kitchen HYDROcodone-acetaminophen (NORCO/VICODIN) 5-325 MG tablet Take 1 tablet by mouth every 6 (six) hours as needed for moderate pain.     Marland Kitchen temazepam (RESTORIL) 15 MG capsule Take 15 mg by mouth at bedtime as needed for sleep.     No current facility-administered medications for this visit.     Allergies as of 09/13/2016  . (No Known Allergies)    ROS:  General: Negative for anorexia, weight loss, fever, chills, fatigue, weakness. ENT: Negative for hoarseness, difficulty swallowing , nasal congestion. CV: Negative for chest pain, angina, palpitations, dyspnea on exertion, peripheral edema.  Respiratory: Negative for dyspnea at rest, dyspnea on exertion, cough, sputum, wheezing.  GI: See history of present illness. GU:  Negative for dysuria, hematuria, urinary incontinence, urinary frequency, nocturnal urination.  Endo: Negative for unusual weight change.    Physical Examination:   BP 112/79   Pulse (!) 108   Temp 98.2 F (36.8 C) (Oral)   Ht 5\' 6"  (1.676 m)   Wt 181 lb 12.8 oz (82.5 kg)   BMI 29.34 kg/m   General: Well-nourished, well-developed in  no acute distress.  Eyes: No icterus. Mouth: Oropharyngeal mucosa moist and pink , no lesions erythema or exudate. Lungs: Clear to auscultation bilaterally.  Heart: Regular rate and rhythm, no murmurs rubs or gallops.  Abdomen: Bowel sounds are normal, nontender, nondistended, no hepatosplenomegaly or masses, no abdominal bruits or hernia , no rebound or guarding.   Extremities: No lower extremity edema. No  clubbing or deformities. Neuro: Alert and oriented x 4   Skin: Warm and dry, no jaundice.   Psych: Alert and cooperative, normal mood and affect.  Labs:  Lab Results  Component Value Date   CREATININE 0.66 05/18/2016   BUN 12 05/18/2016   NA 141 05/18/2016   K 3.0 (L) 05/18/2016   CL 103 05/18/2016   CO2 29 05/18/2016   Lab Results  Component Value Date   WBC 7.3 05/18/2016   HGB 14.7 05/18/2016   HCT 42.0 05/18/2016   MCV 87.3 05/18/2016   PLT 206 05/18/2016   Lab Results  Component Value Date   ALT 20 05/18/2016   AST 20 05/18/2016   ALKPHOS 74 05/18/2016   BILITOT 0.5 05/18/2016    Imaging Studies: No results found.

## 2016-09-13 NOTE — Patient Instructions (Signed)
1. 1-800-QUIT-NOW. Call to get advise on how to quit smoking.  2. Return to the office in six months or call sooner if needed.

## 2016-09-13 NOTE — Assessment & Plan Note (Signed)
Clinically doing well, taking magnesium citrate on occasion. Colonoscopy reassuring December. No evidence of anastomotic stricture on barium enema.  Due for next colonoscopy in December 2020 due to personal history of colon cancer/precancerous polyps. Return to the office in 6 months for follow-up, call sooner if needed.

## 2016-11-21 DIAGNOSIS — R2242 Localized swelling, mass and lump, left lower limb: Secondary | ICD-10-CM | POA: Diagnosis not present

## 2016-11-21 DIAGNOSIS — R42 Dizziness and giddiness: Secondary | ICD-10-CM | POA: Diagnosis not present

## 2016-11-21 DIAGNOSIS — I10 Essential (primary) hypertension: Secondary | ICD-10-CM | POA: Diagnosis not present

## 2016-12-04 ENCOUNTER — Other Ambulatory Visit: Payer: Self-pay | Admitting: Orthopedic Surgery

## 2016-12-08 DIAGNOSIS — G43909 Migraine, unspecified, not intractable, without status migrainosus: Secondary | ICD-10-CM | POA: Diagnosis not present

## 2016-12-08 DIAGNOSIS — Z0001 Encounter for general adult medical examination with abnormal findings: Secondary | ICD-10-CM | POA: Diagnosis not present

## 2016-12-08 DIAGNOSIS — I10 Essential (primary) hypertension: Secondary | ICD-10-CM | POA: Diagnosis not present

## 2016-12-08 DIAGNOSIS — Z79899 Other long term (current) drug therapy: Secondary | ICD-10-CM | POA: Diagnosis not present

## 2016-12-29 ENCOUNTER — Ambulatory Visit (HOSPITAL_COMMUNITY)
Admission: RE | Admit: 2016-12-29 | Discharge: 2016-12-29 | Disposition: A | Discharge status: Home / Self Care | Payer: Medicare Other | Source: Ambulatory Visit | Attending: Family Medicine | Admitting: Family Medicine

## 2016-12-29 ENCOUNTER — Encounter (HOSPITAL_COMMUNITY): Payer: Self-pay | Admitting: Emergency Medicine

## 2016-12-29 ENCOUNTER — Other Ambulatory Visit (HOSPITAL_COMMUNITY): Payer: Self-pay | Admitting: Family Medicine

## 2016-12-29 ENCOUNTER — Emergency Department (HOSPITAL_COMMUNITY)
Admission: EM | Admit: 2016-12-29 | Discharge: 2016-12-30 | Disposition: A | Discharge status: Other Acute Inpatient Hospital | Payer: Medicare Other | Attending: Emergency Medicine | Admitting: Emergency Medicine

## 2016-12-29 DIAGNOSIS — R4689 Other symptoms and signs involving appearance and behavior: Secondary | ICD-10-CM

## 2016-12-29 DIAGNOSIS — F17210 Nicotine dependence, cigarettes, uncomplicated: Secondary | ICD-10-CM | POA: Insufficient documentation

## 2016-12-29 DIAGNOSIS — R45851 Suicidal ideations: Secondary | ICD-10-CM | POA: Diagnosis not present

## 2016-12-29 DIAGNOSIS — N951 Menopausal and female climacteric states: Secondary | ICD-10-CM

## 2016-12-29 DIAGNOSIS — R443 Hallucinations, unspecified: Secondary | ICD-10-CM | POA: Insufficient documentation

## 2016-12-29 DIAGNOSIS — R4589 Other symptoms and signs involving emotional state: Secondary | ICD-10-CM

## 2016-12-29 DIAGNOSIS — I10 Essential (primary) hypertension: Secondary | ICD-10-CM | POA: Diagnosis not present

## 2016-12-29 DIAGNOSIS — Z1231 Encounter for screening mammogram for malignant neoplasm of breast: Secondary | ICD-10-CM

## 2016-12-29 DIAGNOSIS — G4700 Insomnia, unspecified: Secondary | ICD-10-CM | POA: Diagnosis not present

## 2016-12-29 LAB — RAPID URINE DRUG SCREEN, HOSP PERFORMED
AMPHETAMINES: NOT DETECTED
BENZODIAZEPINES: POSITIVE — AB
Barbiturates: NOT DETECTED
COCAINE: NOT DETECTED
Opiates: NOT DETECTED
Tetrahydrocannabinol: NOT DETECTED

## 2016-12-29 LAB — COMPREHENSIVE METABOLIC PANEL
ALBUMIN: 4.6 g/dL (ref 3.5–5.0)
ALT: 14 U/L (ref 14–54)
AST: 15 U/L (ref 15–41)
Alkaline Phosphatase: 78 U/L (ref 38–126)
Anion gap: 8 (ref 5–15)
BUN: 20 mg/dL (ref 6–20)
CHLORIDE: 103 mmol/L (ref 101–111)
CO2: 30 mmol/L (ref 22–32)
Calcium: 9.9 mg/dL (ref 8.9–10.3)
Creatinine, Ser: 0.91 mg/dL (ref 0.44–1.00)
GFR calc Af Amer: >60 mL/min (ref >60)
GFR calc non Af Amer: >60 mL/min (ref >60)
GLUCOSE: 89 mg/dL (ref 65–99)
POTASSIUM: 3.1 mmol/L — AB (ref 3.5–5.1)
Sodium: 141 mmol/L (ref 135–145)
Total Bilirubin: 0.4 mg/dL (ref 0.3–1.2)
Total Protein: 8.2 g/dL — ABNORMAL HIGH (ref 6.5–8.1)

## 2016-12-29 LAB — CBC
HEMATOCRIT: 40.8 % (ref 36.0–46.0)
Hemoglobin: 13.8 g/dL (ref 12.0–15.0)
MCH: 29.6 pg (ref 26.0–34.0)
MCHC: 33.8 g/dL (ref 30.0–36.0)
MCV: 87.4 fL (ref 78.0–100.0)
PLATELETS: 188 10*3/uL (ref 150–400)
RBC: 4.67 MIL/uL (ref 3.87–5.11)
RDW: 14.7 % (ref 11.5–15.5)
WBC: 6.3 10*3/uL (ref 4.0–10.5)

## 2016-12-29 LAB — ACETAMINOPHEN LEVEL

## 2016-12-29 LAB — ETHANOL

## 2016-12-29 LAB — SALICYLATE LEVEL: Salicylate Lvl: <7 mg/dL (ref 2.8–30.0)

## 2016-12-29 MED ORDER — DIAZEPAM 5 MG PO TABS
5.0000 mg | ORAL_TABLET | Freq: Three times a day (TID) | ORAL | Status: DC
  Administered 2016-12-30 (×2): 5 mg via ORAL
  Filled 2016-12-29 (×2): qty 1

## 2016-12-29 MED ORDER — ARIPIPRAZOLE 10 MG PO TABS
30.0000 mg | ORAL_TABLET | Freq: Every day | ORAL | Status: DC
  Filled 2016-12-29 (×3): qty 2

## 2016-12-29 MED ORDER — HYDROCHLOROTHIAZIDE 25 MG PO TABS
50.0000 mg | ORAL_TABLET | Freq: Every day | ORAL | Status: DC
  Administered 2016-12-30: 50 mg via ORAL
  Filled 2016-12-29: qty 2

## 2016-12-29 MED ORDER — PANTOPRAZOLE SODIUM 40 MG PO TBEC
40.0000 mg | DELAYED_RELEASE_TABLET | Freq: Every day | ORAL | Status: DC
  Administered 2016-12-30 (×2): 40 mg via ORAL
  Filled 2016-12-29 (×2): qty 1

## 2016-12-29 MED ORDER — LORAZEPAM 1 MG PO TABS
1.0000 mg | ORAL_TABLET | Freq: Once | ORAL | Status: AC
  Administered 2016-12-29: 1 mg via ORAL
  Filled 2016-12-29: qty 1

## 2016-12-29 MED ORDER — FLUTICASONE PROPIONATE 50 MCG/ACT NA SUSP
1.0000 | Freq: Every day | NASAL | Status: DC | PRN
  Filled 2016-12-29: qty 16

## 2016-12-29 MED ORDER — TOPIRAMATE 25 MG PO TABS
50.0000 mg | ORAL_TABLET | Freq: Every day | ORAL | Status: DC
  Filled 2016-12-29 (×3): qty 2

## 2016-12-29 MED ORDER — AMLODIPINE BESY-BENAZEPRIL HCL 5-10 MG PO CAPS: 1.0000 | ORAL_CAPSULE | Freq: Every day | ORAL | Status: DC

## 2016-12-29 NOTE — ED Provider Notes (Signed)
[facility] DEPT Provider Note   CSN: [id number] Arrival date & time: 12/29/16  1806     History   Chief Complaint Chief Complaint  Patient presents with  . V70.1    HPI Ashley Patrick is a 61 y.o. female.  Patient states that she is hearing voices telling her to hurt herself. She also wants to hurt her son-in-law. She has been seeing small creatures that are telling her to hurt herself    Altered Mental Status   This is a recurrent problem. The current episode started 12 to 24 hours ago. The problem has not changed since onset.Associated symptoms include confusion, agitation and hallucinations. Pertinent negatives include no seizures. Risk factors include alcohol intake. Her past medical history does not include seizures.    Past Medical History:  Diagnosis Date  . Back pain   . Bipolar 1 disorder ([place])   . Cancer [facility]) 2014   Colon Cancer  . Hypercholesteremia   . Hypertension   . Lateral epicondylitis  of elbow    right  . Migraine headache   . Nicotine addiction   . Obesity    History of  . OD (overdose of drug)    hospitalized for 11 days   . Psychosis     Patient Active Problem List   Diagnosis Date Noted  . Tobacco abuse counseling 09/13/2016  . Abnormal CT scan, colon 05/02/2016  . GERD (gastroesophageal reflux disease) 05/02/2016  . Hiatal hernia   . History of colonic polyps   . Diverticulosis of colon without hemorrhage   . Constipation 07/22/2014  . History of colon cancer 07/20/2014  . Dysphagia 07/20/2014  . Lower abdominal pain 09/30/2013  . Bipolar affective disorder, current episode depressed ([place]) 08/08/2013  . Anxiety, generalized 08/08/2013  . 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 Noncompliance with medications 08/07/2013  . Colon cancer ([place]) 06/23/2013  . Rectal bleeding 05/09/2013  . Gastritis 05/09/2013  . LLQ pain 11/28/2012  . Hemorrhagic cyst of ovary 11/28/2012  . SHOULDER PAIN, LEFT  05/18/2010  . ACUTE BRONCHITIS 09/03/2008  . UNSPECIFIED URINARY INCONTINENCE 09/03/2008  . ACUTE CYSTITIS 07/19/2008  . Abdominal pain, generalized 07/09/2008  . OBESITY 11/22/2007  . UNSPECIFIED PSYCHOSIS 11/22/2007  . MIGRAINE HEADACHE 11/22/2007  . HYPERTENSION 11/22/2007  . BACK PAIN 11/22/2007  . LATERAL EPICONDYLITIS OF ELBOW 11/22/2007    Past Surgical History:  Procedure Laterality Date  . ABDOMINAL HYSTERECTOMY    . BIOPSY N/A 08/13/2014   Procedure: BIOPSY;  Surgeon: Daneil Dolin, MD;  Location: [facility] ORS;  Service: Endoscopy;  Laterality: N/A;  . BREAST BIOPSY  10/18/2011   Procedure: BREAST BIOPSY;  Surgeon: Jamesetta So, MD;  Location: [facility] ORS;  Service: General;  Laterality: Left;  . COLON RESECTION N/A 06/23/2013   Procedure: HAND ASSISTED LAPAROSCOPIC PARTIAL COLECTOMY;  Surgeon: Jamesetta So, MD;  Location: [facility] OR[facility];  Service: General;  Laterality: N/A;  . COLONOSCOPY    . COLONOSCOPY N/A 05/29/2013   JME:QASTMHD mass most likely representing colorectal cancer S/ P biospy.Multiple colonic and rectal polyps removed/treated as described above. Colonic diverticulosis  . COLONOSCOPY WITH PROPOFOL N/A 08/13/2014   RMR: Status post sigmoid colectomy. Multiple colonic polyps removed as described above. Redundant colon. Pan colonic diverticuloisi  . COLONOSCOPY WITH PROPOFOL N/A 05/22/2016   Procedure: COLONOSCOPY WITH PROPOFOL;  Surgeon: Daneil Dolin, MD;  Location: [facility] SUITE;  Service: Endoscopy;  Laterality: N/A;  7:30 am  . ESOPHAGEAL DILATION  N/A 08/13/2014   Procedure: [place];  Surgeon: Daneil Dolin, MD;  Location: [facility];  Service: Endoscopy;  Laterality: N/A;  . ESOPHAGOGASTRODUODENOSCOPY N/A 12/26/2012   QQV:ZDGLOVF reflux esophagitis. Gastric and duodenal bulbar erosions-status post gastric biopsynegative H.pylori  . ESOPHAGOGASTRODUODENOSCOPY (EGD) WITH PROPOFOL N/A 08/13/2014   RMR: Small benign cystic-appearing lesion in distal  esophagus of doubtful clinical significance, otherwise normal esophagus, status post Maloney dilation. Small hiatal hernia, some retained gastric contents (query delayed gastric emptying.)  . partial hysterectomy    . POLYPECTOMY N/A 08/13/2014   Procedure: POLYPECTOMY;  Surgeon: Daneil Dolin, MD;  Location: [facility];  Service: Endoscopy;  Laterality: N/A;    OB History    Gravida Para Term Preterm AB Living   4 3 3   1 3    SAB TAB Ectopic Multiple Live Births     1             Home Medications    Prior to Admission medications   Medication Sig Start Date End Date Taking? Authorizing Provider  amLODipine-benazepril (LOTREL) 10-20 MG per capsule Take 1 capsule by mouth daily.  12/04/14   [provider]  Cholecalciferol (VITAMIN D3) 2000 UNITS TABS Take 1 tablet by mouth daily.    [provider]  clonazePAM (KLONOPIN) 1 MG tablet Take 1 mg by mouth 3 (three) times daily.  12/18/14   [provider]  Docusate Calcium (STOOL SOFTENER PO) Take 2 tablets by mouth daily.    [provider]  fluticasone (FLONASE) 50 MCG/ACT nasal spray Place 1 spray into both nostrils daily as needed for allergies.  12/16/14   [provider]  hydrochlorothiazide (HYDRODIURIL) 50 MG tablet Take 50 mg by mouth daily.    [provider]  HYDROcodone-acetaminophen (NORCO/VICODIN) 5-325 MG tablet Take 1 tablet by mouth every 6 (six) hours as needed for moderate pain.  04/12/16   [provider]  predniSONE (DELTASONE) 10 MG tablet TAKE 1 TABLET BY MOUTH TWICE DAILY WITH MEALS. 12/04/16   Carole Civil, MD  temazepam (RESTORIL) 15 MG capsule Take 15 mg by mouth at bedtime as needed for sleep.    [provider]    Family History Family History  Problem Relation Age of Onset  . Colon cancer Father        diagnosed in his 45s  . Anesthesia problems Neg Hx   . Hypotension Neg Hx   . Malignant hyperthermia Neg Hx   . Pseudochol deficiency  Neg Hx     Social History Social History  Substance Use Topics  . Smoking status: Current Every Day Smoker    Packs/day: 0.50    Years: 38.00    Types: Cigarettes    Last attempt to quit: 06/19/2014  . Smokeless tobacco: Never Used     Comment: vapor only  . Alcohol use No     Allergies   Patient has no known allergies.   Review of Systems Review of Systems  Constitutional: Negative for appetite change and fatigue.  HENT: Negative for congestion, ear discharge and sinus pressure.   Eyes: Negative for discharge.  Respiratory: Negative for cough.   Cardiovascular: Negative for chest pain.  Gastrointestinal: Negative for abdominal pain and diarrhea.  Genitourinary: Negative for frequency and hematuria.  Musculoskeletal: Negative for back pain.  Skin: Negative for rash.  Neurological: Negative for seizures and headaches.  Psychiatric/Behavioral: Positive for agitation, confusion and hallucinations.     Physical Exam Updated  Vital Signs Ht 5\' 4"  (1.626 m)   Wt 87.1 kg (192 lb)   BMI 32.96 kg/m   Physical Exam  Constitutional: She is oriented to person, place, and time. She appears well-developed.  HENT:  Head: Normocephalic.  Eyes: Conjunctivae and EOM are normal. No scleral icterus.  Neck: Neck supple. No thyromegaly present.  Cardiovascular: Normal rate and regular rhythm.  Exam reveals no gallop and no friction rub.   No murmur heard. Pulmonary/Chest: No stridor. She has no wheezes. She has no rales. She exhibits no tenderness.  Abdominal: She exhibits no distension. There is no tenderness. There is no rebound.  Musculoskeletal: Normal range of motion. She exhibits no edema.  Lymphadenopathy:    She has no cervical adenopathy.  Neurological: She is oriented to person, place, and time. She exhibits normal muscle tone. Coordination normal.  Skin: No rash noted. No erythema.  Psychiatric:  Patient is having auditory and visual hallucinations. She is thinking about  hurting herself and other people.     ED Treatments / Results  Labs (all labs ordered are listed, but only abnormal results are displayed) Labs Reviewed  COMPREHENSIVE METABOLIC PANEL - Abnormal; Notable for the following:       Result Value   Potassium 3.1 (*)    Total Protein 8.2 (*)    All other components within normal limits  ACETAMINOPHEN LEVEL - Abnormal; Notable for the following:    Acetaminophen (Tylenol), Serum <10 (*)    All other components within normal limits  RAPID URINE DRUG SCREEN, HOSP PERFORMED - Abnormal; Notable for the following:    Benzodiazepines POSITIVE (*)    All other components within normal limits  ETHANOL  SALICYLATE LEVEL  CBC    EKG  EKG Interpretation None       Radiology No results found.  Procedures Procedures (including critical care time)  Medications Ordered in ED Medications  LORazepam (ATIVAN) tablet 1 mg (not administered)     Initial Impression / Assessment and Plan / ED Course  I have reviewed the triage vital signs and the nursing notes.  Pertinent labs & imaging results that were available during my care of the patient were reviewed by me and considered in my medical decision making (see chart for details).     Suicidal homicidal and having visual and auditory hallucinations  Final Clinical Impressions(s) / ED Diagnoses   Final diagnoses:  Suicidal behavior without attempted self-injury    New Prescriptions New Prescriptions   No medications on file     Milton Ferguson, MD 12/29/16 2116

## 2016-12-29 NOTE — Progress Notes (Signed)
CSW faxed referrals out to the following:  [place],  [place],  [place],  Teacher, music,  [place] T. Judi Cong, MSW, [place] Work Disposition [phone]
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
## 2016-12-29 NOTE — ED Triage Notes (Signed)
Hx of hearing voices, states it is getting worse and she is tired, also states she is seeing people. " they told me to walk in front of car, told me to cut my wrist like it did before, they are telling me I am not worth anything"  SI attempt 3 years, pt is tearful and said if she went home tonight, she would harm herself.

## 2016-12-30 ENCOUNTER — Inpatient Hospital Stay (HOSPITAL_COMMUNITY)
Admission: AD | Admit: 2016-12-30 | Discharge: 2017-01-06 | DRG: 885 | Disposition: A | Discharge status: Home / Self Care | Payer: Medicare Other | Source: Intra-hospital | Attending: Psychiatry | Admitting: Psychiatry

## 2016-12-30 ENCOUNTER — Encounter (HOSPITAL_COMMUNITY): Payer: Self-pay

## 2016-12-30 DIAGNOSIS — G4700 Insomnia, unspecified: Secondary | ICD-10-CM | POA: Diagnosis present

## 2016-12-30 DIAGNOSIS — R443 Hallucinations, unspecified: Secondary | ICD-10-CM | POA: Diagnosis not present

## 2016-12-30 DIAGNOSIS — F333 Major depressive disorder, recurrent, severe with psychotic symptoms: Secondary | ICD-10-CM | POA: Diagnosis not present

## 2016-12-30 DIAGNOSIS — Z9049 Acquired absence of other specified parts of digestive tract: Secondary | ICD-10-CM

## 2016-12-30 DIAGNOSIS — R921 Mammographic calcification found on diagnostic imaging of breast: Secondary | ICD-10-CM

## 2016-12-30 DIAGNOSIS — Z85048 Personal history of other malignant neoplasm of rectum, rectosigmoid junction, and anus: Secondary | ICD-10-CM

## 2016-12-30 DIAGNOSIS — Z830 Family history of human immunodeficiency virus [HIV] disease: Secondary | ICD-10-CM

## 2016-12-30 DIAGNOSIS — I10 Essential (primary) hypertension: Secondary | ICD-10-CM | POA: Diagnosis not present

## 2016-12-30 DIAGNOSIS — K219 Gastro-esophageal reflux disease without esophagitis: Secondary | ICD-10-CM | POA: Diagnosis not present

## 2016-12-30 DIAGNOSIS — Z86010 Personal history of colonic polyps: Secondary | ICD-10-CM

## 2016-12-30 DIAGNOSIS — Z79899 Other long term (current) drug therapy: Secondary | ICD-10-CM | POA: Diagnosis not present

## 2016-12-30 DIAGNOSIS — R45851 Suicidal ideations: Secondary | ICD-10-CM | POA: Diagnosis present

## 2016-12-30 DIAGNOSIS — Z716 Tobacco abuse counseling: Secondary | ICD-10-CM | POA: Diagnosis not present

## 2016-12-30 DIAGNOSIS — R45850 Homicidal ideations: Secondary | ICD-10-CM | POA: Diagnosis present

## 2016-12-30 DIAGNOSIS — Z800 Family history of malignant neoplasm of digestive organs: Secondary | ICD-10-CM

## 2016-12-30 DIAGNOSIS — F17210 Nicotine dependence, cigarettes, uncomplicated: Secondary | ICD-10-CM | POA: Diagnosis present

## 2016-12-30 DIAGNOSIS — Z62810 Personal history of physical and sexual abuse in childhood: Secondary | ICD-10-CM | POA: Diagnosis present

## 2016-12-30 DIAGNOSIS — Z90710 Acquired absence of both cervix and uterus: Secondary | ICD-10-CM

## 2016-12-30 DIAGNOSIS — Z9889 Other specified postprocedural states: Secondary | ICD-10-CM | POA: Diagnosis not present

## 2016-12-30 DIAGNOSIS — Z818 Family history of other mental and behavioral disorders: Secondary | ICD-10-CM

## 2016-12-30 DIAGNOSIS — Z915 Personal history of self-harm: Secondary | ICD-10-CM

## 2016-12-30 DIAGNOSIS — Z811 Family history of alcohol abuse and dependence: Secondary | ICD-10-CM | POA: Diagnosis not present

## 2016-12-30 MED ORDER — AMLODIPINE BESYLATE 5 MG PO TABS: 5.0000 mg | ORAL_TABLET | Freq: Every day | ORAL | Status: DC

## 2016-12-30 MED ORDER — DIAZEPAM 5 MG PO TABS
5.0000 mg | ORAL_TABLET | Freq: Three times a day (TID) | ORAL | Status: DC
  Administered 2016-12-31: 5 mg via ORAL
  Filled 2016-12-30: qty 1

## 2016-12-30 MED ORDER — ARIPIPRAZOLE 15 MG PO TABS
30.0000 mg | ORAL_TABLET | Freq: Every day | ORAL | Status: DC
  Administered 2016-12-31: 30 mg via ORAL
  Filled 2016-12-30 (×3): qty 2

## 2016-12-30 MED ORDER — PANTOPRAZOLE SODIUM 40 MG PO TBEC
40.0000 mg | DELAYED_RELEASE_TABLET | Freq: Every day | ORAL | Status: DC
  Administered 2016-12-31 – 2017-01-06 (×7): 40 mg via ORAL
  Filled 2016-12-30 (×9): qty 1

## 2016-12-30 MED ORDER — TOPIRAMATE 25 MG PO TABS
50.0000 mg | ORAL_TABLET | Freq: Every day | ORAL | Status: DC
  Administered 2016-12-31: 50 mg via ORAL
  Filled 2016-12-30 (×4): qty 2

## 2016-12-30 MED ORDER — HYDROCHLOROTHIAZIDE 25 MG PO TABS
50.0000 mg | ORAL_TABLET | Freq: Every day | ORAL | Status: DC
  Administered 2016-12-31 – 2017-01-06 (×7): 50 mg via ORAL
  Filled 2016-12-30: qty 1
  Filled 2016-12-30 (×2): qty 2
  Filled 2016-12-30: qty 1
  Filled 2016-12-30 (×4): qty 2
  Filled 2016-12-30: qty 1
  Filled 2016-12-30: qty 2
  Filled 2016-12-30: qty 1
  Filled 2016-12-30: qty 2

## 2016-12-30 MED ORDER — FLUTICASONE PROPIONATE 50 MCG/ACT NA SUSP: 1.0000 | Freq: Every day | NASAL | Status: DC | PRN

## 2016-12-30 MED ORDER — BENAZEPRIL HCL 10 MG PO TABS
10.0000 mg | ORAL_TABLET | Freq: Every day | ORAL | Status: DC
  Filled 2016-12-30 (×3): qty 1

## 2016-12-30 MED ORDER — ACETAMINOPHEN 325 MG PO TABS
650.0000 mg | ORAL_TABLET | Freq: Four times a day (QID) | ORAL | Status: DC | PRN
Start: 2016-12-30 — End: 2017-01-07

## 2016-12-30 MED ORDER — AMLODIPINE BESYLATE 5 MG PO TABS
5.0000 mg | ORAL_TABLET | Freq: Every day | ORAL | Status: DC
  Administered 2016-12-31 – 2017-01-06 (×7): 5 mg via ORAL
  Filled 2016-12-30 (×9): qty 1

## 2016-12-30 MED ORDER — HYDROXYZINE HCL 25 MG PO TABS
25.0000 mg | ORAL_TABLET | Freq: Three times a day (TID) | ORAL | Status: DC | PRN
  Administered 2016-12-30 – 2017-01-05 (×7): 25 mg via ORAL
  Filled 2016-12-30 (×7): qty 1

## 2016-12-30 MED ORDER — NICOTINE POLACRILEX 2 MG MT GUM
2.0000 mg | CHEWING_GUM | OROMUCOSAL | Status: DC | PRN
  Administered 2016-12-31 – 2017-01-06 (×12): 2 mg via ORAL
  Filled 2016-12-30: qty 5
  Filled 2016-12-30 (×4): qty 1
  Filled 2016-12-30: qty 6

## 2016-12-30 MED ORDER — TRAZODONE HCL 50 MG PO TABS: 50.0000 mg | ORAL_TABLET | Freq: Every evening | ORAL | Status: DC | PRN

## 2016-12-30 MED ORDER — MAGNESIUM HYDROXIDE 400 MG/5ML PO SUSP
30.0000 mL | Freq: Every day | ORAL | Status: DC | PRN
  Administered 2017-01-01 – 2017-01-05 (×2): 30 mL via ORAL
  Filled 2016-12-30 (×2): qty 30

## 2016-12-30 MED ORDER — ALUM & MAG HYDROXIDE-SIMETH 200-200-20 MG/5ML PO SUSP: 30.0000 mL | ORAL | Status: DC | PRN

## 2016-12-30 MED ORDER — BENAZEPRIL HCL 10 MG PO TABS
10.0000 mg | ORAL_TABLET | Freq: Every day | ORAL | Status: DC
  Administered 2016-12-31 – 2017-01-06 (×7): 10 mg via ORAL
  Filled 2016-12-30 (×9): qty 1

## 2016-12-30 NOTE — ED Notes (Signed)
Breakfast tray given to pt 

## 2016-12-30 NOTE — ED Notes (Signed)
Pt stable and ready for transport to [facility]. Report given to [place], Therapist, sports at [facility]

## 2016-12-30 NOTE — Tx Team (Signed)
Initial Treatment Plan 12/30/2016 4:25 PM JAANA BRODT [id number]    PATIENT STRESSORS: Financial difficulties Health problems Medication change or noncompliance   PATIENT STRENGTHS: Ability for insight Average or above average intelligence Capable of independent living Communication skills   PATIENT IDENTIFIED PROBLEMS: "voices to go away"  "to get better"  Anxiety  Depression               DISCHARGE CRITERIA:  Ability to meet basic life and health needs Adequate post-discharge living arrangements Safe-care adequate arrangements made Verbal commitment to aftercare and medication compliance  PRELIMINARY DISCHARGE PLAN: Attend aftercare/continuing care group Attend PHP/IOP Outpatient therapy Return to previous living arrangement  PATIENT/FAMILY INVOLVEMENT: This treatment plan has been presented to and reviewed with the patient, [name], and/or family member.  The patient and family have been given the opportunity to ask questions and make suggestions.  Vela Prose, RN 12/30/2016, 4:25 PM

## 2016-12-30 NOTE — Progress Notes (Signed)
Admission Note: Patient is an 61 year old female who is admitted to the unit with symptoms of depression.  Patient presents with flat affect and depressed mood during assessment.  Patient was tearful during assessment.  Reports auditory and visual hallucinations.  Patient currently denies suicidal ideation.  Patient states she need help getting the voices out of her head.  Admission plan of care reviewed and consent for treatment signed.  Skin assessment completed.  Skin is dry and intact.  Personal belonging searched.  No contraband found.  Patient oriented to the unit, staff and room.  Routine safety checks initiated.  Patient offered support and encouragement as needed.  Patient is safe on the unit.

## 2016-12-30 NOTE — BH Assessment (Signed)
[place] notified pt's RN Corene Cornea that pt has been accepted by Gwenlyn Found NP to attending [place] MD to bed 503-2. Pt can be transported asap. # for report [phone].   0940 Pt is cooperative. She appears sad and she continues to endorses SI. She endorses [facility] with command to cut her wrists. She reports suicide attempt 3 yrs ago by same method. Pt denies having access to weapons. She reports she thinks about harming her daughter's boyfriend but denies plan or intent. Pt answers "yes" when asked if she feels like people are out to get here. She denies anyone in particular wants to harm her. Pt states she didn't sleep much, and she ate all her breakfast.  Arnold Long, LCSW Therapeutic Triage Specialist

## 2016-12-30 NOTE — ED Provider Notes (Signed)
Has been accepted in [facility] - Dr. Shea Evans, [place] completed at 10:33 AM   Noemi Chapel, MD 12/30/16 1034

## 2016-12-31 DIAGNOSIS — R443 Hallucinations, unspecified: Secondary | ICD-10-CM

## 2016-12-31 DIAGNOSIS — F333 Major depressive disorder, recurrent, severe with psychotic symptoms: Principal | ICD-10-CM

## 2016-12-31 DIAGNOSIS — R45851 Suicidal ideations: Secondary | ICD-10-CM

## 2016-12-31 DIAGNOSIS — Z830 Family history of human immunodeficiency virus [HIV] disease: Secondary | ICD-10-CM

## 2016-12-31 MED ORDER — TOPIRAMATE 25 MG PO TABS
25.0000 mg | ORAL_TABLET | Freq: Every day | ORAL | Status: DC
  Administered 2017-01-01 – 2017-01-06 (×6): 25 mg via ORAL
  Filled 2016-12-31 (×8): qty 1

## 2016-12-31 MED ORDER — QUETIAPINE FUMARATE 50 MG PO TABS
50.0000 mg | ORAL_TABLET | Freq: Once | ORAL | Status: AC
  Administered 2017-01-01: 50 mg via ORAL
  Filled 2016-12-31 (×2): qty 1

## 2016-12-31 MED ORDER — OLANZAPINE 5 MG PO TABS
5.0000 mg | ORAL_TABLET | Freq: Every day | ORAL | Status: DC
  Administered 2016-12-31: 5 mg via ORAL
  Filled 2016-12-31 (×3): qty 1

## 2016-12-31 MED ORDER — POTASSIUM CHLORIDE CRYS ER 20 MEQ PO TBCR
20.0000 meq | EXTENDED_RELEASE_TABLET | Freq: Two times a day (BID) | ORAL | Status: AC
  Administered 2016-12-31 – 2017-01-01 (×3): 20 meq via ORAL
  Filled 2016-12-31 (×3): qty 1

## 2016-12-31 MED ORDER — FLUOXETINE HCL 10 MG PO CAPS
10.0000 mg | ORAL_CAPSULE | Freq: Every day | ORAL | Status: DC
  Administered 2016-12-31 – 2017-01-02 (×3): 10 mg via ORAL
  Filled 2016-12-31 (×5): qty 1

## 2016-12-31 MED ORDER — DIAZEPAM 2 MG PO TABS
2.0000 mg | ORAL_TABLET | Freq: Three times a day (TID) | ORAL | Status: DC
  Administered 2016-12-31 – 2017-01-02 (×6): 2 mg via ORAL
  Filled 2016-12-31 (×6): qty 1

## 2016-12-31 NOTE — Progress Notes (Signed)
Adult Psychoeducational Group Note  Date:  12/31/2016 Time:  1400  Group Topic/Focus:  Making Healthy Choices:   The focus of this group is to help patients identify negative/unhealthy supportsystems they were using prior to admission and identify positive/healthier strategies to replace them upon discharge.   Participation Level:  Active  Modes of Intervention:  Discussion and Education  Additional Comments:  Conducted by Sandre Kitty, RN  Ashley Patrick 12/31/2016,1400

## 2016-12-31 NOTE — BHH Suicide Risk Assessment (Signed)
[facility] Admission Suicide Risk Assessment   Nursing information obtained from:  Patient Demographic factors:  Low socioeconomic status Current Mental Status:  NA Loss Factors:  Financial problems / change in socioeconomic status Historical Factors:  Prior suicide attempts Risk Reduction Factors:  Positive therapeutic relationship  Total Time spent with patient: 45 minutes Principal Problem: MDD, with psychotic features  Diagnosis:   Patient Active Problem List   Diagnosis Date Noted  . MDD (major depressive disorder), recurrent, severe, with psychosis ([place]) [F33.3] 12/30/2016  . Tobacco abuse counseling [Z71.6] 09/13/2016  . Abnormal CT scan, colon [R93.3] 05/02/2016  . GERD (gastroesophageal reflux disease) [K21.9] 05/02/2016  . Hiatal hernia [K44.9]   . History of colonic polyps [Z86.010]   . Diverticulosis of colon without hemorrhage [K57.30]   . Constipation [K59.00] 07/22/2014  . History of colon cancer [Z85.038] 07/20/2014  . Dysphagia [R13.10] 07/20/2014  . Lower abdominal pain [R10.30] 09/30/2013  . Bipolar affective disorder, current episode depressed ([place]) [F31.30] 08/08/2013  . Anxiety, generalized [F41.1] 08/08/2013  . 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 Noncompliance with medications [Z91.14] 08/07/2013  . Colon cancer ([place]) [C18.9] 06/23/2013  . Rectal bleeding [K62.5] 05/09/2013  . Gastritis [K29.70] 05/09/2013  . LLQ pain [R10.32] 11/28/2012  . Hemorrhagic cyst of ovary [N83.209] 11/28/2012  . SHOULDER PAIN, LEFT [M25.519] 05/18/2010  . ACUTE BRONCHITIS [J20.9] 09/03/2008  . UNSPECIFIED URINARY INCONTINENCE [R32] 09/03/2008  . ACUTE CYSTITIS [N30.00] 07/19/2008  . Abdominal pain, generalized [R10.84] 07/09/2008  . OBESITY [E66.9] 11/22/2007  . UNSPECIFIED PSYCHOSIS [F29] 11/22/2007  . MIGRAINE HEADACHE [G43.909] 11/22/2007  . HYPERTENSION [I10] 11/22/2007  . BACK PAIN [M54.9] 11/22/2007  . LATERAL EPICONDYLITIS OF  ELBOW [M77.10] 11/22/2007    Continued Clinical Symptoms:  Alcohol Use Disorder Identification Test Final Score (AUDIT): 0 The "Alcohol Use Disorders Identification Test", Guidelines for Use in Primary Care, Second Edition.  World Pharmacologist [facility]). Score between 0-7:  no or low risk or alcohol related problems. Score between 8-15:  moderate risk of alcohol related problems. Score between 16-19:  high risk of alcohol related problems. Score 20 or above:  warrants further diagnostic evaluation for alcohol dependence and treatment.   CLINICAL FACTORS:  61 year old female, presents with worsening depression, prominent neuro-vegetative symptoms of depression, suicidal ideations of walking into traffic, and history of intermittent auditory /visual hallucinations.  Reports worsening, persistence of symptoms in spite of being compliant with Abilify up to 30 mgrs daily.    Psychiatric Specialty Exam: Physical Exam  ROS  Blood pressure 109/70, pulse 87, temperature 98.4 F (36.9 C), resp. rate 18, height 5\' 4"  (1.626 m), weight 86.2 kg (190 lb), SpO2 100 %.Body mass index is 32.61 kg/m.  See admit note MSE    COGNITIVE FEATURES THAT CONTRIBUTE TO RISK:  Closed-mindedness and Loss of executive function    SUICIDE RISK:   Moderate:  Frequent suicidal ideation with limited intensity, and duration, some specificity in terms of plans, no associated intent, good self-control, limited dysphoria/symptomatology, some risk factors present, and identifiable protective factors, including available and accessible social support.  PLAN OF CARE: Patient will be admitted to inpatient psychiatric unit for stabilization and safety. Will provide and encourage milieu participation. Provide medication management and maked adjustments as needed.  Will follow daily.    I certify that inpatient services furnished can reasonably be expected to improve the patient's condition.   Jenne Campus,  MD 12/31/2016, 1:14 PM
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
## 2016-12-31 NOTE — Plan of Care (Signed)
Problem: Activity: Goal: Sleeping patterns will improve Outcome: Progressing Patient is resting in bed with eyes closed; respirations even and unlabored. Patient reports sleeping well with current regimen. MHT observes patient resting during q15 minute checks.  Problem: Safety: Goal: Periods of time without injury will increase Outcome: Progressing Patient is on q15 minute safety checks and low fall risk precautions. Patient contracts for safety on the unit and remains safe at this time.

## 2016-12-31 NOTE — Progress Notes (Signed)
[facility] Group Notes:  (Nursing/MHT/Case Management/Adjunct)  Date:  12/31/2016  Time:  2045  Type of Therapy:  wrap up group  Participation Level:  Active  Participation Quality:  Appropriate  Affect:  Appropriate  Cognitive:  Appropriate  Insight:  Improving  Engagement in Group:  Engaged  Modes of Intervention:  Clarification, Education and Support  Summary of Progress/Problems: Patient reported enjoying having girl time with peer patients in the dayroom today.  Pt hopes to have better sleep and rest tonight. Pt shared if she could change any one thing about her life it would be to have no depression whatsoever. Pt is grateful for her children and grandchildren.   Shellia Cleverly 12/31/2016, 10:46 PM

## 2016-12-31 NOTE — Progress Notes (Signed)
Nursing Progress Note [id number]  D) Patient presents calm and cooperative. Patient is seen visible in the milieu and taking medications. Patient requests something for sleep but informs writer "I can't do trazodone, I don't like taking it". Patient medicated with vistaril instead. Patient requested nicotine gum from Probation officer. Patient denies SI/HI or pain. Patient endorses auditory hallucinations. Patient contracts for safety on the unit.  A) Emotional support given. 1:1 interaction and active listening provided. Patient medicated as prescribed. Medications and plan of care reviewed with patient. Patient verbalized understanding without further questions. Snacks and fluids provided. Opportunities for questions or concerns presented to patient. Patient encouraged to continue to work on treatment goals. Labs, vital signs and patient behavior monitored throughout shift. Patient safety maintained with q15 min safety checks. Low fall risk precautions in place and reviewed with patient; patient verbalized understanding.  R) Patient receptive to interaction with nurse. Patient remains safe on the unit at this time. Patient denies any adverse medication reactions at this time. Patient is resting in bed without complaints. Will continue to monitor.

## 2016-12-31 NOTE — [facility] Group Notes (Signed)
[facility] Group Therapy Notes:  (Clinical Social Work)   12/31/2016     10:00-11:00AM  Summary of Progress/Problems:   The main focus of today's process group was to              1)  discuss importance of adding supports to stay well once out of the hospital             2)  generate ideas about what healthy supports can be added             3)  Talk about unhealthy supports, specifically those present in patients' lives             4)  Share group ideas about how to handle/reduce/eliminate unhealthy supports  The usefulness of counselor, doctor, therapy groups, 12-step groups, and problem-specific support groups to expand supports was explained.  The emphasis of the group was on generating specific ideas for eliminating or managing unhealthy supports currently present in patient lives.  At the beginning of group, when asked what she would be doing today if she was not in the hospital, the patient stated she would be spending the day with her grandchildren or in church.  She was very active and outspoken in group, talked about both her sons being very opposed to her being hospitalized and/or being on medication.  She received a phone call from oldest son who said that being in the hospital was not something she needed.  Her youngest son has been in a family meeting with a doctor in the past, and acted out screaming and cursing at the doctor, will not allow her to see his children if she is on medicine.  Type of Therapy:  Process Group with Motivational Interviewing  Participation Level:  Active  Participation Quality:  Attentive and Sharing  Affect:  Depressed and Blunted  Cognitive:  Oriented  Insight:  Improving  Engagement in Therapy:  Engaged  Modes of Intervention:   Education, Support and [employer], LCSW 12/31/2016, 12:54 PM

## 2016-12-31 NOTE — Progress Notes (Signed)
Goals Wrap Up Group  Rating for Day: 3/10  Goal for Day: "No goal, just got here". Met/Unmet: N/A Goal for tomorrow: "Get certain things off my mind".  Patient Behavior: Appropriate, engaged  Other: Patient reports adjusting well to milieu. Patient requests medications for sleep.  [address]  12/30/2016 8:00 PM Group facilitated by Baldo Daub, RN
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
## 2017-01-01 LAB — LIPID PANEL
Cholesterol: 256 mg/dL — ABNORMAL HIGH (ref 0–200)
HDL: 56 mg/dL (ref >40)
LDL CALC: 165 mg/dL — AB (ref 0–99)
TRIGLYCERIDES: 177 mg/dL — AB (ref <150)
Total CHOL/HDL Ratio: 4.6 RATIO
VLDL: 35 mg/dL (ref 0–40)

## 2017-01-01 LAB — TSH: TSH: 3.689 u[IU]/mL (ref 0.350–4.500)

## 2017-01-01 MED ORDER — HALOPERIDOL 5 MG PO TABS
5.0000 mg | ORAL_TABLET | Freq: Once | ORAL | Status: AC
  Administered 2017-01-02: 5 mg via ORAL
  Filled 2017-01-01 (×2): qty 1

## 2017-01-01 MED ORDER — RISPERIDONE 2 MG PO TABS
2.0000 mg | ORAL_TABLET | Freq: Every day | ORAL | Status: DC
  Administered 2017-01-01 – 2017-01-05 (×5): 2 mg via ORAL
  Filled 2017-01-01 (×7): qty 1

## 2017-01-01 NOTE — Progress Notes (Signed)
Nursing Progress Note [id number]  D) Patient is resting in bed. Respirations are even and unlabored. No changes noted from previous RN assessment.  A) Patient to be medicated as prescribed/requested. Patient safety maintained with q15 min safety checks. Low fall risk precautions in place.  R) Patient remains safe on the unit at this time. Will continue to monitor.

## 2017-01-01 NOTE — Progress Notes (Signed)
D: Pt was in the hallway upon initial approach.  Pt presents with anxious affect and mood.  Describes her day as "pretty good."  Reports feeling "a little depressed."  Reports goal is to "relate to people more today."  Pt denies SI/HI, reports AH earlier that "told me to break the trays and cutmy wrist but I ignored it," denies visual hallucinations, denies pain.  Pt has been visible in milieu interacting with peers and staff appropriately.  Pt attended evening group.    A: Introduced self to pt.  Actively listened to pt and offered support and encouragement. Medication administered per order.  PRN medication administered for anxiety.  Q15 minute safety checks maintained.  R: Pt is safe on the unit.  Pt is compliant with medications.  Pt verbally contracts for safety.  Will continue to monitor and assess.
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
## 2017-01-01 NOTE — Progress Notes (Signed)
Ashley Patrick had been up and visible in milieu this evening, did attend and participate in evening group activity. Ashley Patrick spoke about on-going depression and how Ashley Patrick wished Ashley Patrick did not have depression. Ashley Patrick also spoke about on-going anxiety and insomnia and spoke about how Ashley Patrick has been having trouble sleeping because Ashley Patrick is having trouble shutting her mind down. Ashley Patrick was able to receive bedtime medications without incident and did not verbalize any complaints of pain. A. Support and encouragement provided. R. Safety maintained, will continue to monitor.

## 2017-01-01 NOTE — Progress Notes (Signed)
Recreation Therapy Notes  Date: 01/01/2017 Time:9:30am Location: [address] Dayroom  Group Topic: Stress Management  Goal Area(s) Addresses:  Patient will verbalize importance of using healthy stress management.  Patient will identify positive emotions associated with healthy stress management.   Intervention: Stress Management  Activity: Mindful Meditation. Recreation Therapy Intern introduced the stress management technique of meditation. Recreation Therapy Intern played a mindful meditation video that focused on deep breathing and forgetting about their worries. Patients were to follow along at the video was played to engage in the activity.  Education:Stress Management, Discharge Planning.   Education Outcome:Acknowledges edcuation  Clinical Observations/Feedback:Pt did not attend group.  Donovan Kail, Recreation Therapy Intern
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
## 2017-01-01 NOTE — Progress Notes (Signed)
Pt increasingly anxious due to her roommate keeping her awake.  Her roommate is confused and has been talking to pt, jumping out of bed and yelling at times.  Pt is very anxious and writer offered to let her sleep in quiet room.  Pt agreed to do so and requests PRN medication for anxiety.  She is also reporting AH at this time.  On-site provider notified.  Haldol 5 mg POX1 ordered and administered.  Pt denies further needs and concerns at this time.  Will continue to monitor and assess.

## 2017-01-01 NOTE — B[facility] Group Notes (Signed)
[facility] LCSW Aftercare Discharge Planning Group Note  01/01/2017 8:45 AM  Participation Quality: Alert, Appropriate and Oriented  Mood/Affect: Lethargic  Plan for Discharge/Comments: Pt attended discharge planning group and actively participated in group. CSW discussed suicide prevention education with the group and encouraged them to discuss discharge planning and any relevant barriers. Pt denies any current concerns but expresses that she wants to discharge as soon as possible.   Adriana Reams, LCSW 01/01/2017 10:32 AM

## 2017-01-01 NOTE — Plan of Care (Signed)
Problem: Activity: Goal: Interest or engagement in activities will improve Outcome: Progressing Pt compliant with attending groups and engages with peers.

## 2017-01-01 NOTE — BHH Suicide Risk Assessment (Signed)
[facility] INPATIENT:  Family/Significant Other Suicide Prevention Education  Suicide Prevention Education:  Education Completed; Ashley Patrick, Pt's daughter [phone], has been identified by the patient as the family member/significant other with whom the patient will be residing, and identified as the person(s) who will aid the patient in the event of a mental health crisis (suicidal ideations/suicide attempt).  With written consent from the patient, the family member/significant other has been provided the following suicide prevention education, prior to the and/or following the discharge of the patient.  The suicide prevention education provided includes the following:  Suicide risk factors  Suicide prevention and interventions  National Suicide Hotline telephone number  [facility] assessment telephone number  [facility] Emergency Assistance [place] and/or Residential Mobile Crisis Unit telephone number  Request made of family/significant other to:  Remove weapons (e.g., guns, rifles, knives), all items previously/currently identified as safety concern.    Remove drugs/medications (over-the-counter, prescriptions, illicit drugs), all items previously/currently identified as a safety concern.  The family member/significant other verbalizes understanding of the suicide prevention education information provided.  The family member/significant other agrees to remove the items of safety concern listed above.  Pt's daughter feels that Pt has made some progress since admission but feels she would benefit from a few more days. Per daughter, Pt's son-in-law (this daughter's husband) is currently incarcerated and Pt would not have any access to him. Pt has expressed HI towards son-in-law Ashley Patrick).   [place] Lighter 01/01/2017, 10:41 AM

## 2017-01-01 NOTE — BHH Group Notes (Signed)
[place] LCSW Group Therapy  01/01/2017 1:15pm  Type of Therapy:  Group Therapy vercoming Obstacles  Participation Level:  Reserved  Participation Quality:  Appropriate   Affect:  Appropriate  Cognitive:  Appropriate and Oriented  Insight:  Developing/Improving and Improving  Engagement in Therapy:  Improving  Modes of Intervention:  Discussion, Exploration, Problem-solving and Support  Description of Group:   In this group patients will be encouraged to explore what they see as obstacles to their own wellness and recovery. They will be guided to discuss their thoughts, feelings, and behaviors related to these obstacles. The group will process together ways to cope with barriers, with attention given to specific choices patients can make. Each patient will be challenged to identify changes they are motivated to make in order to overcome their obstacles. This group will be process-oriented, with patients participating in exploration of their own experiences as well as giving and receiving support and challenge from other group members.  Summary of Patient Progress: Pt identifies that she often stays in her apartment with the blinds closed and the television off. Pt identifies racing thoughts. However, she was able to process how that may not be a healthy balance as her thoughts are often negative.    Therapeutic Modalities:   Cognitive Behavioral Therapy Solution Focused Therapy Motivational Interviewing Relapse Prevention Therapy   Adriana Reams, LCSW 01/01/2017 2:59 PM

## 2017-01-01 NOTE — BHH Group Notes (Signed)
[facility] Group Notes:  (Nursing/MHT/Case Management/Adjunct)  Date:  01/01/2017  Time:  11:57 PM  Type of Therapy:  Psychoeducational Skills  Participation Level:  Active  Participation Quality:  Appropriate  Affect:  Appropriate  Cognitive:  Alert  Insight:  Appropriate  Engagement in Group:  Engaged  Modes of Intervention:  Discussion and Education  Summary of Progress/Problems:  Pt participated in wrap-up group. Pt's goal today was to talk to the doctor about medication, and she achieved her goal. Pt stated one positive thing about today was that she got to go outside and get fresh air.   Lita Mains 01/01/2017, 11:57 PM

## 2017-01-02 LAB — PROLACTIN: PROLACTIN: 5.3 ng/mL (ref 4.8–23.3)

## 2017-01-02 LAB — HEMOGLOBIN A1C
Hgb A1c MFr Bld: 5.5 % (ref 4.8–5.6)
MEAN PLASMA GLUCOSE: 111 mg/dL

## 2017-01-02 IMAGING — CT CT ABD-PELV W/ CM
2 of 5 series · 15 of 46 positions shown, 17 images · IV contrast (APPLIED)
Comparison: CT of the abdomen and pelvis from 01/15/2014, and
abdominal ultrasound performed 01/19/2015

CLINICAL DATA: Subacute onset of constipation and cramping lower
abdominal pain. Abdominal distention. Initial encounter.

EXAM:
CT ABDOMEN AND PELVIS WITH CONTRAST
TECHNIQUE: Multidetector CT imaging of the abdomen and pelvis was performed
using the standard protocol following bolus administration of
intravenous contrast.
CONTRAST:  100mL [id number] IOPAMIDOL ([id number]) INJECTION 61%

[Series 2: axial st · axial · 0.75mm/px · z∈[-316,+74]mm · 12 of 90 slices shown, 14 images]
[im 6/90  soft-tissue]
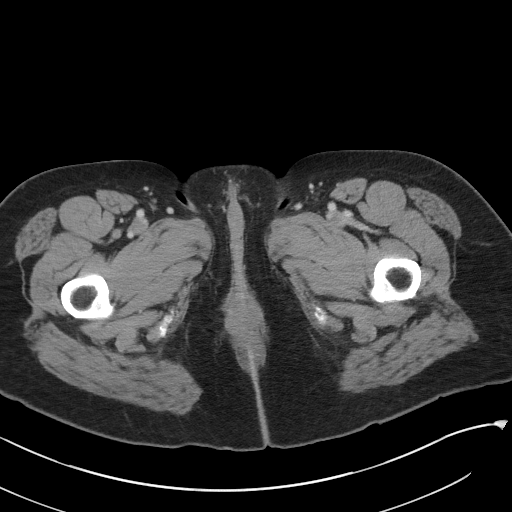
[im 6/90  bone]
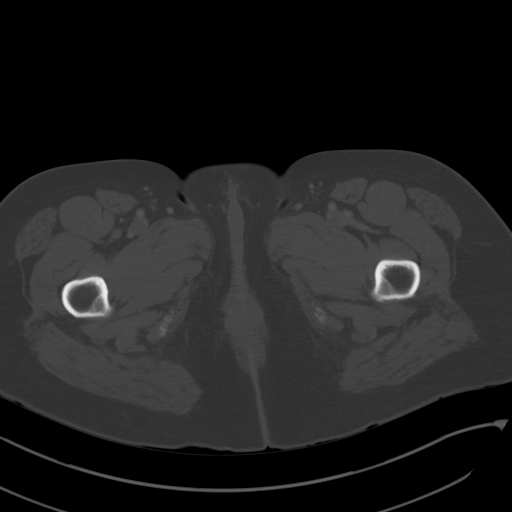
[im 16/90  soft-tissue]
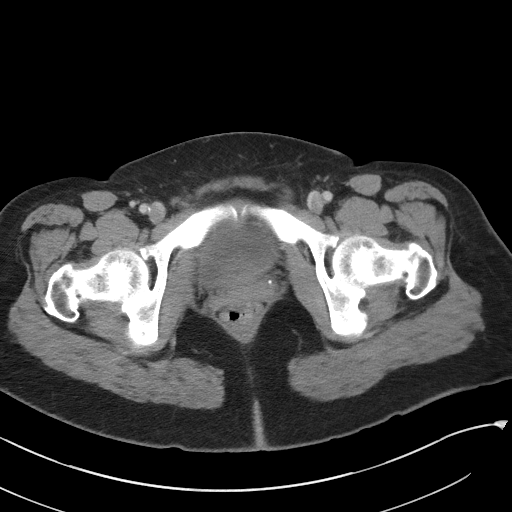
[im 21/90  soft-tissue]
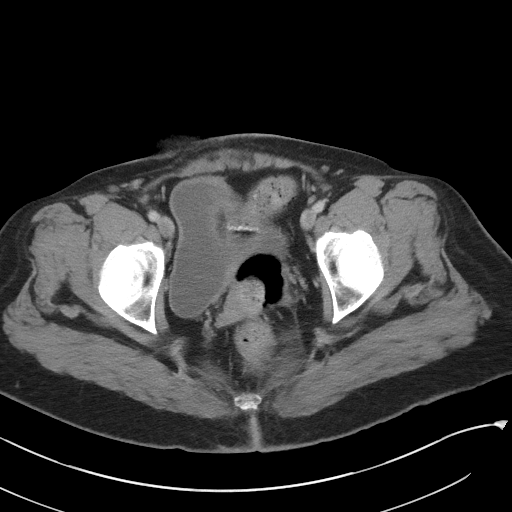
[im 27/90  soft-tissue]
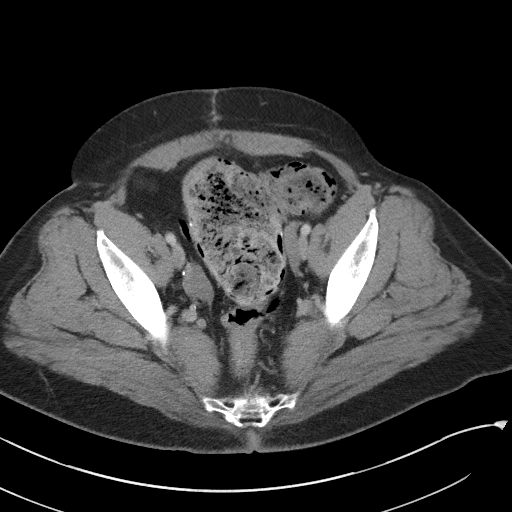
[im 37/90  soft-tissue]
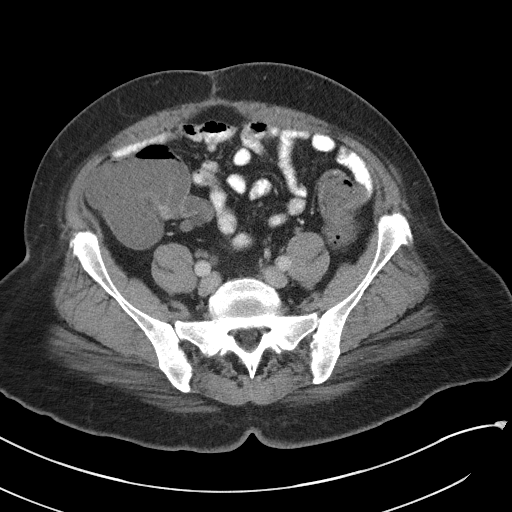
[im 42/90  soft-tissue]
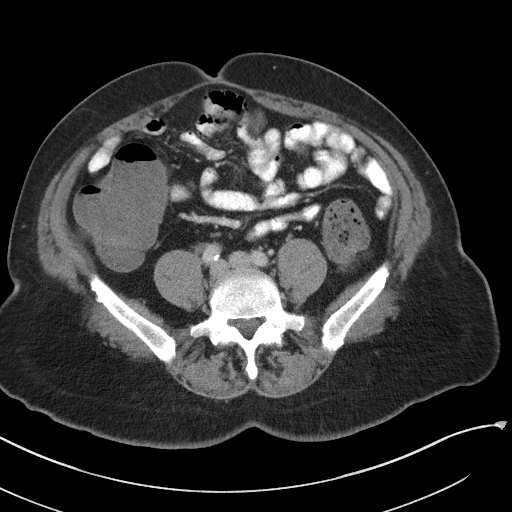
[im 48/90  soft-tissue]
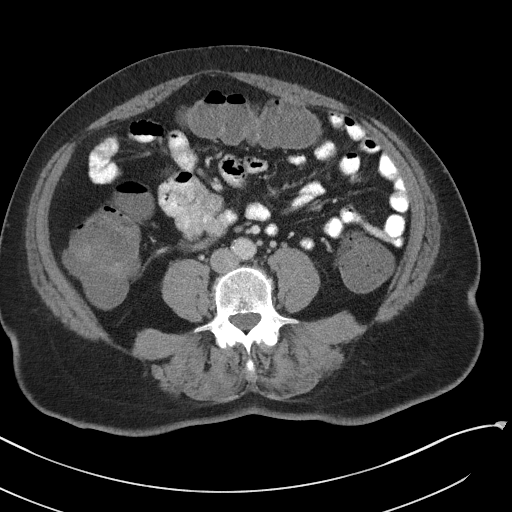
[im 58/90  soft-tissue]
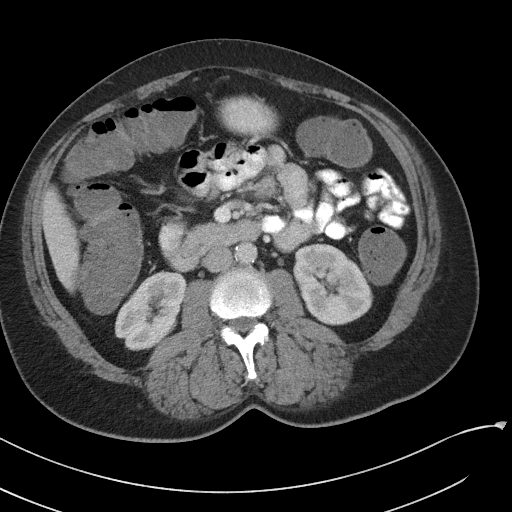
[im 63/90  soft-tissue]
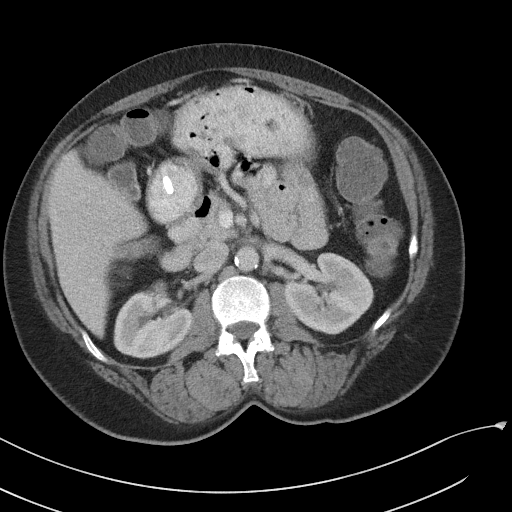
[im 63/90  bone]
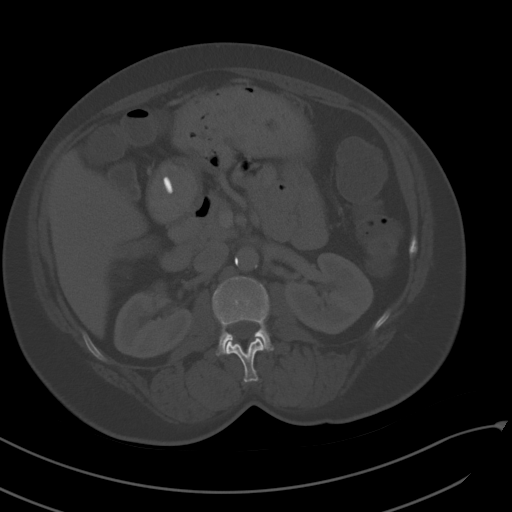
[im 69/90  soft-tissue]
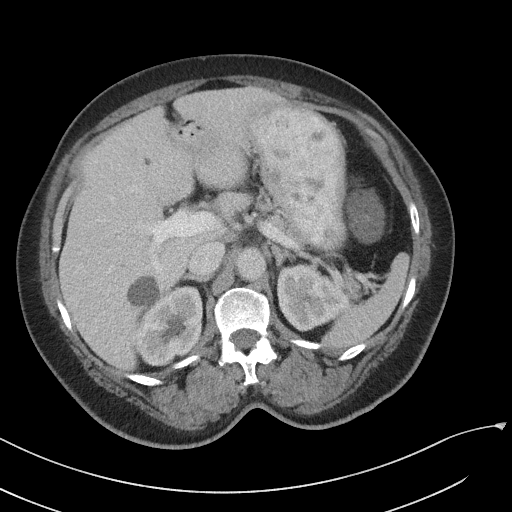
[im 79/90  soft-tissue]
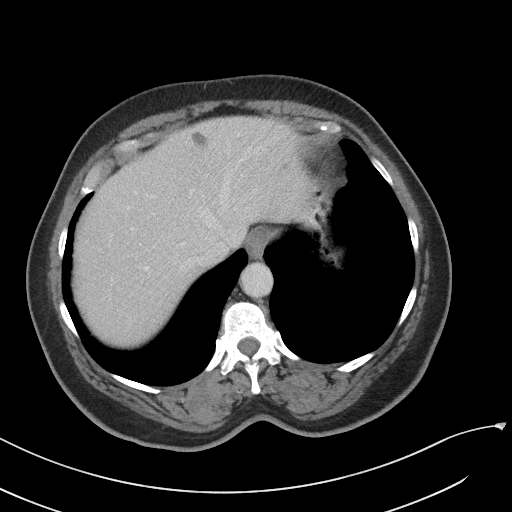
[im 84/90  soft-tissue]
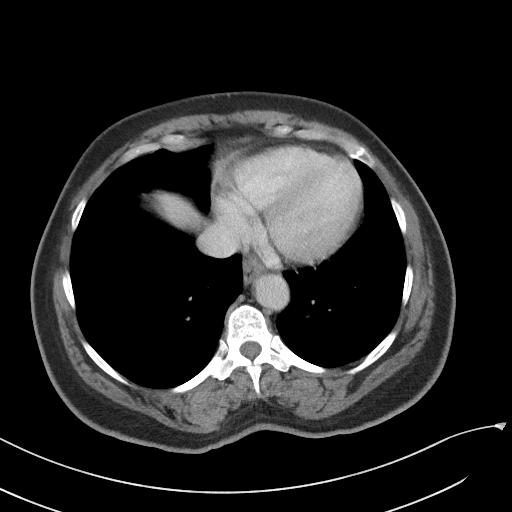

[Series 4: coronal st · coronal · 0.78mm/px · 3 of 110 slices shown]
[im 37/110  soft-tissue]
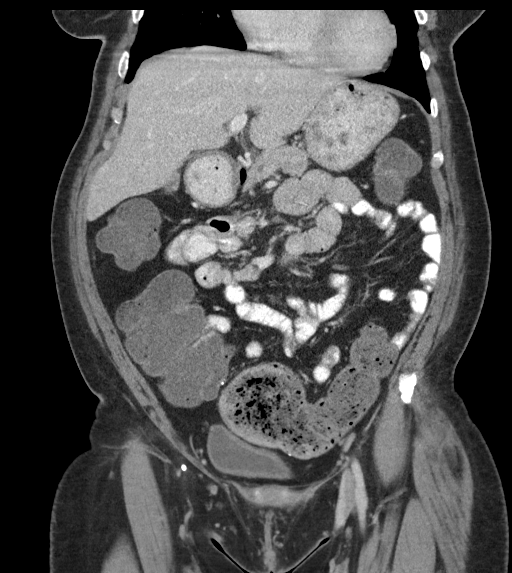
[im 49/110  soft-tissue]
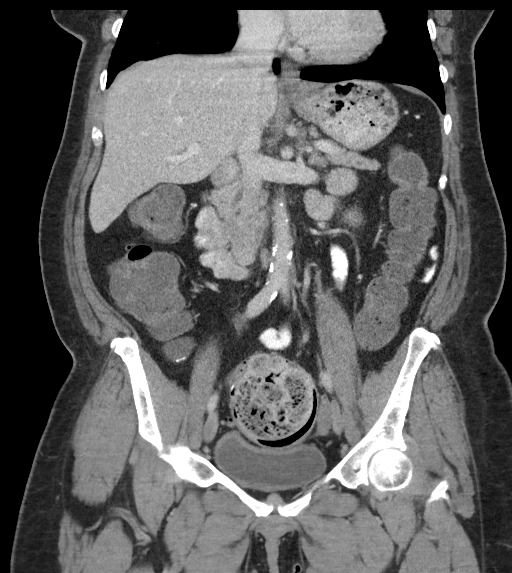
[im 61/110  soft-tissue]
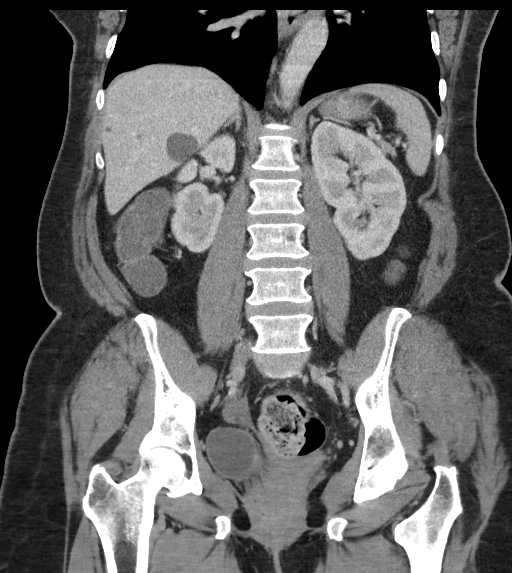

[15 of 46 positions shown; findings below may reference images not displayed]

FINDINGS: Lower chest: The visualized lung bases are grossly clear. The
visualized portions of the mediastinum are unremarkable.

Hepatobiliary: Scattered hypodensities are noted within the liver,
measuring up to 2.6 cm in size. These likely reflect cysts. The
gallbladder is grossly unremarkable in appearance. The common bile
duct remains normal in caliber.

Pancreas: The pancreas is within normal limits.

Spleen: The spleen is unremarkable in appearance.

Adrenals/Urinary Tract: The adrenal glands are unremarkable in
appearance. Small right renal cysts are noted. There is no evidence
of hydronephrosis. No renal or ureteral stones are identified. No
perinephric stranding is seen.

Stomach/Bowel: The stomach is unremarkable in appearance. The small
bowel is within normal limits. The appendix is normal in caliber,
without evidence of appendicitis.

The colon is largely filled with fluid. A large amount of dense
stool is noted within the mid sigmoid colon, at the site of the
patient's colonic anastomosis, causing the patient's constipation.
The dense stool measures approximately 12 x 7 x 7 cm. This is
approximately 18 cm above the external sphincter. The distal sigmoid
colon is mostly empty.

Vascular/Lymphatic: Scattered calcification is seen along the
abdominal aorta and its branches. The abdominal aorta is otherwise
grossly unremarkable. The inferior vena cava is grossly
unremarkable. No retroperitoneal lymphadenopathy is seen. No pelvic
sidewall lymphadenopathy is identified.

Reproductive: The bladder is mildly distended and grossly
unremarkable. The patient is status post partial hysterectomy. No
suspicious adnexal masses are seen. The ovaries appear grossly
symmetric.

Other: No additional soft tissue abnormalities are seen.

Musculoskeletal: No acute osseous abnormalities are identified. The
visualized musculature is unremarkable in appearance.
IMPRESSION: 1. Large amount of dense stool noted at the mid sigmoid colon, at
the site of the patient's colonic anastomosis, causing the patient's
constipation. The dense stool measures approximate 12 x 7 x 7 cm.
This is approximately 18 cm above the external sphincter. The distal
sigmoid colon is mostly empty.
2. Large amount of fluid noted filling the remainder of the colon,
proximal to the dense stool.
3. Scattered hypodensities within the liver, measuring up to 2.6 cm
in size, likely reflect cysts.
4. Small right renal cysts seen.
5. Scattered aortic atherosclerosis noted.
These results were called by telephone at the time of interpretation
on 03/30/2016 at [DATE] to Dr. ENDER JOSE MAURICIO, who verbally
acknowledged these results.

## 2017-01-02 MED ORDER — RISPERIDONE 1 MG PO TABS
1.0000 mg | ORAL_TABLET | ORAL | Status: DC
  Administered 2017-01-03: 1 mg via ORAL
  Filled 2017-01-02 (×2): qty 1

## 2017-01-02 MED ORDER — DIAZEPAM 2 MG PO TABS
2.0000 mg | ORAL_TABLET | Freq: Two times a day (BID) | ORAL | Status: DC
  Administered 2017-01-03 – 2017-01-04 (×3): 2 mg via ORAL
  Filled 2017-01-02 (×3): qty 1

## 2017-01-02 MED ORDER — FLUOXETINE HCL 20 MG PO CAPS
20.0000 mg | ORAL_CAPSULE | Freq: Every day | ORAL | Status: DC
  Administered 2017-01-03 – 2017-01-06 (×4): 20 mg via ORAL
  Filled 2017-01-02 (×6): qty 1

## 2017-01-02 NOTE — Progress Notes (Signed)
Pt got up and stated she wanted to go back to her room . Pt was escorted back to her room.

## 2017-01-02 NOTE — BHH Group Notes (Signed)
[place] LCSW Group Therapy 01/02/2017 1:15 PM  Type of Therapy: Group Therapy- Feelings about Diagnosis  Participation Level: Active   Participation Quality:  Appropriate  Affect:  Appropriate  Cognitive: Alert and Oriented   Insight:  Developing   Engagement in Therapy: Developing/Improving and Engaged   Modes of Intervention: Clarification, Confrontation, Discussion, Education, Exploration, Limit-setting, Orientation, Problem-solving, Rapport Building, Art therapist, Socialization and Support  Description of Group:   This group will allow patients to explore their thoughts and feelings about diagnoses they have received. Patients will be guided to explore their level of understanding and acceptance of these diagnoses. Facilitator will encourage patients to process their thoughts and feelings about the reactions of others to their diagnosis, and will guide patients in identifying ways to discuss their diagnosis with significant others in their lives. This group will be process-oriented, with patients participating in exploration of their own experiences as well as giving and receiving support and challenge from other group members.  Summary of Progress/Problems:  Pt expresses that side effects from medications keep her from eating at times. Pt was attentive and processed with peers the difficulty of living with a mental health diagnosis.  Therapeutic Modalities:   Cognitive Behavioral Therapy Solution Focused Therapy Motivational Interviewing Relapse Prevention Therapy  Adriana Reams, LCSW 01/02/2017 3:59 PM

## 2017-01-02 NOTE — Progress Notes (Signed)
Patient ID: Ashley Patrick, female   DOB: 20-May-1956, 61 y.o.   MRN: [id number] D: Patient reports a decrease in her depressive symptoms.  She also reports a decrease in auditory hallucinations.  Patient hears voices intermittently.  Patient rates her depression as an 8; hopelessness as a 8; anxiety as a 4.5.  Her goal today is to "get some rest and get rid of the voices."  Patient presents with a flat, blunted affect. Patient has been visible on the unit and has attended some groups.  She denies any thoughts of self harm. A: Continue to monitor medication management and MD orders.  Safety checks completed every 15 minutes per protocol.  Offer support and encouragement as needed. R: Patient is receptive to staff; her behavior is appropriate.
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
## 2017-01-03 LAB — BASIC METABOLIC PANEL
ANION GAP: 10 (ref 5–15)
BUN: 22 mg/dL — ABNORMAL HIGH (ref 6–20)
CALCIUM: 10 mg/dL (ref 8.9–10.3)
CO2: 24 mmol/L (ref 22–32)
CREATININE: 1.1 mg/dL — AB (ref 0.44–1.00)
Chloride: 107 mmol/L (ref 101–111)
GFR calc Af Amer: >60 mL/min (ref >60)
GFR, EST NON AFRICAN AMERICAN: 54 mL/min — AB (ref >60)
GLUCOSE: 104 mg/dL — AB (ref 65–99)
Potassium: 3.8 mmol/L (ref 3.5–5.1)
Sodium: 141 mmol/L (ref 135–145)

## 2017-01-03 MED ORDER — RISPERIDONE 2 MG PO TABS
2.0000 mg | ORAL_TABLET | ORAL | Status: DC
  Administered 2017-01-04 – 2017-01-06 (×3): 2 mg via ORAL
  Filled 2017-01-03 (×5): qty 1

## 2017-01-03 NOTE — Progress Notes (Signed)
Pt was observed in the dayroom. When asked if she had any questions or concerns that hadn't been addressed the pt stated "yes'. She asked if she could switch rooms". Stated, that her "roommate gets up during the night and moves around".   A:  Support and encouragement was offered. 15 min checks continued for safety.  R: Pt remains safe.

## 2017-01-03 NOTE — B[facility] Group Notes (Signed)
[facility] Group Therapy 01/03/2017 1:15pm  Type of Therapy: Mental Health Association Presentation  Participation Level: Active  Participation Quality: Attentive  Affect: Appropriate  Cognitive: Oriented  Insight: Developing/Improving  Engagement in Therapy: Engaged  Modes of Intervention: Discussion, Education and Socialization  Summary of Progress/Problems: Mental Health Association ([place]) Speaker came to talk about his personal journey with substance abuse and addiction. The pt processed ways by which to relate to the speaker. [place] speaker provided handouts and educational information pertaining to groups and services offered by the [facility]. Pt was engaged in speaker's presentation and was receptive to resources provided.    Adriana Reams, LCSW 01/03/2017 2:49 PM
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
## 2017-01-03 NOTE — Progress Notes (Signed)
Recreation Therapy Notes   Date: 01/03/2017 Time: 9:30am Location: [address] Dayroom  Group Topic: Stress Management  Goal Area(s) Addresses:  Patient will verbalize importance of using healthy stress management.  Patient will identify positive emotions associated with healthy stress management.   Behavioral Response: Engaged  Intervention: Stress Management  Activity :  Meditation. Recreation Therapy Intern introduced the stress management technique of meditation. Recreation Therapy Intern read a script that allowed patients to help patients meditate. Recreation Therapy Intern played zen meditation music. Patients were to follow along as script was read to engage in the activity.  Education: Stress Management, Discharge Planning.   Education Outcome: Acknowledges edcuation  Clinical Observations/Feedback: Pt attended group.  Donovan Kail, Recreation Therapy Intern  Victorino Sparrow, LRT/CTRS

## 2017-01-03 NOTE — Progress Notes (Signed)
Patient denies SI, HI and AVH.  Patient reports a decrease in anxiety and depressive thoughts.  Patient was noted to be in pleasant mood.   Assess patient for safety offer medications as prescribed engage in 1:1 staff talks.   Continue to monitor as prescribed.
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
## 2017-01-04 NOTE — Progress Notes (Addendum)
[facility] MD Progress Note  01/04/2017 1:12 PM Ashley Patrick  MRN:  [id number]   Subjective:  Patient reports she is improving . Remains depressed, but acknowledges depression is less severe than on admission. She has a long history of psychotic symptoms, but states that she has not had visual or auditory hallucinations over the last day . She feels medications are helping .  Denies current homicidal ideations .  Denies medication side effects.  Objective:  I have discussed case with treatment team and have met with patient . As discussed with staff, she has presented with partial improvement .  She presents less severely blunted in affect, and although still restricted in affect, does smile  briefly at times .As above, she reports decreased visual and auditory hallucinations compared to admission and none so far today. Patient denies any current homicidal or violent ideation towards anyone, and also specifically denies any plan or intention of violence towards her step son. States " I am going  to keep my distance from him". Insight remains fair, but has improved compared to admission. At this time more amenable to discuss her hallucinations as symptoms of a disease than as objective reality. We discussed importance of adequate medication compliance to help with hallucinations, and have encouraged her to consider a long acting IM depot medication but she is not interested at this time. As she improves she is becoming somewhat more visible on unit, behavior in good control. Limited group participation. Currently denies medication side effects . Principal Problem: MDD (major depressive disorder), recurrent, severe, with psychosis ([place]) Diagnosis:   Patient Active Problem List   Diagnosis Date Noted  . MDD (major depressive disorder), recurrent, severe, with psychosis ([place]) [F33.3] 12/30/2016  . Tobacco abuse counseling [Z71.6] 09/13/2016  . Abnormal CT scan, colon [R93.3] 05/02/2016  . GERD  (gastroesophageal reflux disease) [K21.9] 05/02/2016  . Hiatal hernia [K44.9]   . History of colonic polyps [Z86.010]   . Diverticulosis of colon without hemorrhage [K57.30]   . Constipation [K59.00] 07/22/2014  . History of colon cancer [Z85.038] 07/20/2014  . Dysphagia [R13.10] 07/20/2014  . Lower abdominal pain [R10.30] 09/30/2013  . Bipolar affective disorder, current episode depressed ([place]) [F31.30] 08/08/2013  . Anxiety, generalized [F41.1] 08/08/2013  . 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 Noncompliance with medications [Z91.14] 08/07/2013  . Colon cancer ([place]) [C18.9] 06/23/2013  . Rectal bleeding [K62.5] 05/09/2013  . Gastritis [K29.70] 05/09/2013  . LLQ pain [R10.32] 11/28/2012  . Hemorrhagic cyst of ovary [N83.209] 11/28/2012  . SHOULDER PAIN, LEFT [M25.519] 05/18/2010  . ACUTE BRONCHITIS [J20.9] 09/03/2008  . UNSPECIFIED URINARY INCONTINENCE [R32] 09/03/2008  . ACUTE CYSTITIS [N30.00] 07/19/2008  . Abdominal pain, generalized [R10.84] 07/09/2008  . OBESITY [E66.9] 11/22/2007  . UNSPECIFIED PSYCHOSIS [F29] 11/22/2007  . MIGRAINE HEADACHE [G43.909] 11/22/2007  . HYPERTENSION [I10] 11/22/2007  . BACK PAIN [M54.9] 11/22/2007  . LATERAL EPICONDYLITIS OF ELBOW [M77.10] 11/22/2007   Total Time spent with patient: 20 minutes  Past Psychiatric History: See H&P  Past Medical History:  Past Medical History:  Diagnosis Date  . Back pain   . Bipolar 1 disorder ([place])   . Cancer [facility]) 2014   Colon Cancer  . Hypercholesteremia   . Hypertension   . Lateral epicondylitis  of elbow    right  . Migraine headache   . Nicotine addiction   . Obesity    History of  . OD (overdose of drug)    hospitalized for 11  days   . Psychosis     Past Surgical History:  Procedure Laterality Date  . ABDOMINAL HYSTERECTOMY    . BIOPSY N/A 08/13/2014   Procedure: BIOPSY;  Surgeon: Daneil Dolin, MD;  Location: [facility] ORS;  Service: Endoscopy;   Laterality: N/A;  . BREAST BIOPSY  10/18/2011   Procedure: BREAST BIOPSY;  Surgeon: Jamesetta So, MD;  Location: [facility] ORS;  Service: General;  Laterality: Left;  . COLON RESECTION N/A 06/23/2013   Procedure: HAND ASSISTED LAPAROSCOPIC PARTIAL COLECTOMY;  Surgeon: Jamesetta So, MD;  Location: [facility] ORS;  Service: General;  Laterality: N/A;  . COLONOSCOPY    . COLONOSCOPY N/A 05/29/2013   QIH:KVQQVZD mass most likely representing colorectal cancer S/ P biospy.Multiple colonic and rectal polyps removed/treated as described above. Colonic diverticulosis  . COLONOSCOPY WITH PROPOFOL N/A 08/13/2014   RMR: Status post sigmoid colectomy. Multiple colonic polyps removed as described above. Redundant colon. Pan colonic diverticuloisi  . COLONOSCOPY WITH PROPOFOL N/A 05/22/2016   Procedure: COLONOSCOPY WITH PROPOFOL;  Surgeon: Daneil Dolin, MD;  Location: [facility] ENDO SUITE;  Service: Endoscopy;  Laterality: N/A;  7:30 am  . ESOPHAGEAL DILATION N/A 08/13/2014   Procedure: [place];  Surgeon: Daneil Dolin, MD;  Location: [facility] ORS;  Service: Endoscopy;  Laterality: N/A;  . ESOPHAGOGASTRODUODENOSCOPY N/A 12/26/2012   GLO:VFIEPPI reflux esophagitis. Gastric and duodenal bulbar erosions-status post gastric biopsynegative H.pylori  . ESOPHAGOGASTRODUODENOSCOPY (EGD) WITH PROPOFOL N/A 08/13/2014   RMR: Small benign cystic-appearing lesion in distal esophagus of doubtful clinical significance, otherwise normal esophagus, status post [place] dilation. Small hiatal hernia, some retained gastric contents (query delayed gastric emptying.)  . partial hysterectomy    . POLYPECTOMY N/A 08/13/2014   Procedure: POLYPECTOMY;  Surgeon: Daneil Dolin, MD;  Location: [facility] ORS;  Service: Endoscopy;  Laterality: N/A;   Family History:  Family History  Problem Relation Age of Onset  . Colon cancer Father        diagnosed in his 70s  . Anesthesia problems Neg Hx   . Hypotension Neg Hx   . Malignant  hyperthermia Neg Hx   . Pseudochol deficiency Neg Hx    Family Psychiatric  History: See H&P Social History:  History  Alcohol Use No     History  Drug Use No    Social History   Social History  . Marital status: Legally Separated    Spouse name: N/A  . Number of children: 3  . Years of education: N/A   Social History Main Topics  . Smoking status: Current Every Day Smoker    Packs/day: 0.50    Years: 38.00    Types: Cigarettes    Last attempt to quit: 06/19/2014  . Smokeless tobacco: Never Used     Comment: vapor only  . Alcohol use No  . Drug use: No  . Sexual activity: Yes    Birth control/ protection: Surgical   Other Topics Concern  . None   Social History Narrative  . None   Additional Social History:     Sleep: improving   Appetite:  Good  Current Medications: Current Facility-Administered Medications  Medication Dose Route Frequency Provider Last Rate Last Dose  . acetaminophen (TYLENOL) tablet 650 mg  650 mg Oral Q6H PRN Okonkwo, Justina A, NP      . alum & mag hydroxide-simeth (MAALOX/MYLANTA) 200-200-20 MG/5ML suspension 30 mL  30 mL Oral Q4H PRN Okonkwo, Justina A, NP      .  amLODipine (NORVASC) tablet 5 mg  5 mg Oral Daily Okonkwo, Justina A, NP   5 mg at 01/04/17 0833   And  . benazepril (LOTENSIN) tablet 10 mg  10 mg Oral Daily Okonkwo, Justina A, NP   10 mg at 01/04/17 0833  . diazepam (VALIUM) tablet 2 mg  2 mg Oral BID Cobos, Myer Peer, MD   2 mg at 01/04/17 0834  . FLUoxetine (PROZAC) capsule 20 mg  20 mg Oral Daily Cobos, Myer Peer, MD   20 mg at 01/04/17 0835  . fluticasone (FLONASE) 50 MCG/ACT nasal spray 1 spray  1 spray Each Nare Daily PRN Okonkwo, Justina A, NP      . hydrochlorothiazide (HYDRODIURIL) tablet 50 mg  50 mg Oral Daily Okonkwo, Justina A, NP   50 mg at 01/04/17 0835  . hydrOXYzine (ATARAX/VISTARIL) tablet 25 mg  25 mg Oral TID PRN Hughie Closs A, NP   25 mg at 01/03/17 2216  . magnesium hydroxide (MILK OF MAGNESIA)  suspension 30 mL  30 mL Oral Daily PRN Okonkwo, Justina A, NP   30 mL at 01/01/17 0752  . nicotine polacrilex (NICORETTE) gum 2 mg  2 mg Oral PRN Lindon Romp A, NP   2 mg at 01/04/17 1306  . pantoprazole (PROTONIX) EC tablet 40 mg  40 mg Oral Daily Okonkwo, Justina A, NP   40 mg at 01/04/17 0835  . risperiDONE (RISPERDAL) tablet 2 mg  2 mg Oral QHS Money, Lowry Ram, FNP   2 mg at 01/03/17 2214  . risperiDONE (RISPERDAL) tablet 2 mg  2 mg Oral BH-q7a Cobos, Myer Peer, MD   2 mg at 01/04/17 0615  . topiramate (TOPAMAX) tablet 25 mg  25 mg Oral Daily Cobos, Myer Peer, MD   25 mg at 01/04/17 9147    Lab Results:  Results for orders placed or performed during the hospital encounter of 12/30/16 (from the past 48 hour(s))  Basic metabolic panel     Status: Abnormal   Collection Time: 01/03/17  6:16 AM  Result Value Ref Range   Sodium 141 135 - 145 mmol/L   Potassium 3.8 3.5 - 5.1 mmol/L   Chloride 107 101 - 111 mmol/L   CO2 24 22 - 32 mmol/L   Glucose, Bld 104 (H) 65 - 99 mg/dL   BUN 22 (H) 6 - 20 mg/dL   Creatinine, Ser 1.10 (H) 0.44 - 1.00 mg/dL   Calcium 10.0 8.9 - 10.3 mg/dL   GFR calc non Af Amer 54 (L) >60 mL/min   GFR calc Af Amer >60 >60 mL/min    Comment: (NOTE) The eGFR has been calculated using the CKD EPI equation. This calculation has not been validated in all clinical situations. eGFR's persistently <60 mL/min signify possible Chronic Kidney Disease.    Anion gap 10 5 - 15    Comment: Performed at [facility], [place] [address] [place], [place] [address]    Blood Alcohol level:  Lab Results  Component Value Date   ETH <5 12/29/2016   ETH 5 (H) 21/30/8657    Metabolic Disorder Labs: Lab Results  Component Value Date   HGBA1C 5.5 01/01/2017   MPG 111 01/01/2017   MPG 114 02/29/2016   Lab Results  Component Value Date   PROLACTIN 5.3 01/01/2017   Lab Results  Component Value Date   CHOL 256 (H) 01/01/2017   TRIG 177 (H) 01/01/2017   HDL  56 01/01/2017   CHOLHDL 4.6 01/01/2017  VLDL 35 01/01/2017   LDLCALC 165 (H) 01/01/2017   LDLCALC 122 (H) 02/29/2016    Physical Findings: AIMS: Facial and Oral Movements Muscles of Facial Expression: None, normal Lips and Perioral Area: None, normal Jaw: None, normal Tongue: None, normal,Extremity Movements Upper (arms, wrists, hands, fingers): None, normal Lower (legs, knees, ankles, toes): None, normal, Trunk Movements Neck, shoulders, hips: None, normal, Overall Severity Severity of abnormal movements (highest score from questions above): None, normal Incapacitation due to abnormal movements: None, normal Patient's awareness of abnormal movements (rate only patient's report): No Awareness, Dental Status Current problems with teeth and/or dentures?: No Does patient usually wear dentures?: No  CIWA:  CIWA-Ar Total: 1 COWS:  COWS Total Score: 2  Musculoskeletal: Strength & Muscle Tone: within normal limits Gait & Station: normal Patient leans: N/A  Psychiatric Specialty Exam: Physical Exam  ROS no chest pain, no shortness of breath, no vomiting   Blood pressure 120/79, pulse 85, temperature 99 F (37.2 C), temperature source Oral, resp. rate 18, height '5\' 4"'$  (1.626 m), weight 86.2 kg (190 lb), SpO2 100 %.Body mass index is 32.61 kg/m.  General Appearance: Grooming has improved compared to admission  Eye Contact:  Good  Speech:  improving , less monotone, more verbal overall   Volume:  Normal  Mood:  acknowledges partial mood improvement   Affect:  less blunted, less severely restricted, smiles briefly at times   Thought Process:  Linear and Descriptions of Associations: Intact  Orientation:  Other:  fully alert and attentive  Thought Content:  chronic hallucinations, including chronic visual hallucinations, reports hallucinations have subsided over the last 2 days , and is currently not internally preoccupied, no clear delusional ideations are expressed   Suicidal  Thoughts:  No denies suicidal or self injurious ideations  Homicidal Thoughts:  No denies violent or homicidal ideations towards anyone at this time, and also specifically denies any HI or violent plan or intention towards his daughter's BF .   Memory:  recent and remote grossly intact   Judgement:  Fair- improving   Insight:  Fair- improving   Psychomotor Activity:  Normal  Concentration:  Concentration: Good and Attention Span: Good  Recall:  Good  Fund of Knowledge:  Good  Language:  Good  Akathisia:  No  Handed:  Right  AIMS (if indicated):     Assets:  Financial Resources/Insurance Social Support  ADL's:  Intact  Cognition:  WNL  Sleep:  Number of Hours: 6    Assessment - patient is presenting with a partially and gradually  improving mood. Currently denies SI, denies any current violent or homicidal ideations. Of note, she has history of chronic , persistent hallucinations, including visual halls, but states that she has not had any hallucinations x 1-2 days .  Thus far she is tolerating Prozac/Risperidone combination well.  She has tolerated Valium taper well . Of note, patient presented with some oral dyskinesias on admission-I have had conversation with her about this and she understands risk of movement disorder such as TD on current medication , but dyskinesias have resolved since admission, as Risperidone titrated .   Treatment Plan Summary: Treatment plan reviewed as below today 7/19 Encourage group and milieu participation to work on coping skills and symptom reduction Continue  Risperidone to 2 mgr BID  for psychosis  Continue  Prozac  20 mgrs  QDAY for depression At this time would discontinue  Valium. Patient has agreed with BZD taper/ discontinuation.  Continue Topamax 25 mgrs QDAY  for mood disorder  Continue Vistaril 25 mgrs Q 8 hours PRN for anxiety  Treatment team working on disposition planning options  Patient plans to return home at discharge.   *Of note,  Nursing staff reports that Breast Work up ( Ultrasound, MM Bilateral Tomo) were ordered by Dr. Quintella Reichert. I have reviewed this with patient.  Patient states she has a history of breast lump and that during a previous visit to PCP, following up on this finding had been recommended . Nursing staff have looked into these orders , found that  they were ordered at [facility], and that they were cancelled as patient currently admitted, with instruction that patient call after discharge in order to reschedule. Patient expresses understanding . Jenne Campus, MD 01/04/2017, 1:12 PM   Patient ID: [name], female   DOB: Oct 19, 1955, 61 y.o.   MRN: [id number]

## 2017-01-04 NOTE — Progress Notes (Signed)
D:  Patient's self inventory sheet, patient has fair sleep, no sleep medication given.  Fair appetite, low energy level, good concentration.  Depression 5-6, hopeless 5-6, anxiety 5.  Denied withdrawals.  Denied SI.   Denied physical problems.  Denied pain.  No discharge plans. A:  Medications administered per MD orders.  Emotional support and encouragement given patient. R:  Patient denied SI and HI, contracts for safety.  Denied A/V hallucinations.  Safety maintained with 15 minute checks.

## 2017-01-04 NOTE — Progress Notes (Signed)
Nurse called Anderson Malta, [place], phone [phone], who stated this patient's tests were scheduled by [facility].  That these tests have been canceled since patient was in the hospital.  Patient needs to call [place] after discharge from [facility] and reschedule appointment.  If patient does not call soon, [facility] will call patient to reschedule these tests.  MD, [facility] and charge nurse informed.

## 2017-01-04 NOTE — Plan of Care (Signed)
Problem: Coping: Goal: Ability to identify and develop effective coping behavior will improve Outcome: Progressing Nurse discussed depression/anxiety/coping skills with patient.

## 2017-01-04 NOTE — [facility] Group Notes (Signed)
[place] LCSW Group Therapy 01/04/2017 1:15pm  Type of Therapy: Group Therapy- [facility]  Pt did not attend, declined invitation.   Adriana Reams, LCSW 01/04/2017 4:36 PM

## 2017-01-04 NOTE — [facility] Group Notes (Signed)

## 2017-01-04 NOTE — Progress Notes (Signed)
D- Patient is in a depressed mood with a flat affect.  Brightens on approach.  Patient currently denies SI, HI, and pain. Patient verbalizes AVH stating "I see a man in a black rain jacket with a black hat. I can't see his face".  Patient reports that the Visual hallucination can be "scary" at times.  Patient reports "the man tells me to do some things.  He told me to take the tray, break it with my feet, and cut my wrist".  Patient reports that the medications have been helping with the hallucinations. Patient reports that she had a "good day" and reports the best part of her day was "mingling and talking to people". Patient rates her day "6/10". No further complaints. A- Scheduled medications administered to patient, per MD orders. Support and encouragement provided.  Routine safety checks conducted every 15 minutes.  Patient informed to notify staff with problems or concerns. R- No adverse drug reactions noted. Patient contracts for safety at this time. Patient compliant with medications and treatment plan. Patient receptive, calm, and cooperative. Patient interacts well with others on the unit.  Patient remains safe at this time.

## 2017-01-05 ENCOUNTER — Inpatient Hospital Stay (HOSPITAL_COMMUNITY): Admission: RE | Admit: 2017-01-05 | Payer: Medicare Other | Source: Ambulatory Visit

## 2017-01-05 ENCOUNTER — Ambulatory Visit (HOSPITAL_COMMUNITY): Payer: Self-pay

## 2017-01-05 MED ORDER — HYDROCORTISONE 2.5 % RE CREA
TOPICAL_CREAM | Freq: Two times a day (BID) | RECTAL | Status: DC
  Administered 2017-01-06: 08:00:00 via RECTAL
  Filled 2017-01-05 (×2): qty 28.35

## 2017-01-05 NOTE — Progress Notes (Signed)
  [facility] Adult Case Management Discharge Plan :  Will you be returning to the same living situation after discharge:  Yes,  Pt returning home At discharge, do you have transportation home?: Yes,  Pt's daughter to pick up Do you have the ability to pay for your medications: Yes,  Pt provided with prescriptions  Patient to Follow up at: Follow-up [facility] Follow up on 01/11/2017.   Why:  at 2:00pm with Rosalio Macadamia for your hospital discharge appointment.  Contact information: [address] [place] [address] [phone]        Services, [facility] Follow up on 01/08/2017.   Why:  Hospital discharge follow up appointment on Monday 7/23 at 8:30 AM to be assessed and scheduled for medications management needs.  Bring photo ID and social security card, insurance cards and hospital discharge paperwork. Contact information: [address] [place] [address] [phone]           Next level of care provider has access to [place] and Suicide Prevention discussed: Yes,  with daughter; see SPE note  Have you used any form of tobacco in the last 30 days? (Cigarettes, Smokeless Tobacco, Cigars, and/or Pipes): Yes  Has patient been referred to the Quitline?: Patient refused referral  Patient has been referred for addiction treatment: Yes  Gladstone Lighter 01/05/2017, 11:50 AM

## 2017-01-05 NOTE — Progress Notes (Signed)
Recreation Therapy Notes  Date: 01/05/2017 Time: 9:30am Location: [address] Dayroom  Group Topic: Stress Management  Goal Area(s) Addresses:  Patient will verbalize importance of using healthy stress management.  Patient will identify positive emotions associated with healthy stress management.   Behavioral Response: Engaged  Intervention: Stress Management  Activity : Relaxation Visualization. Recreation Therapy Intern introduced the stress management technique of visualization. Recreation Therapy Intern read a script that allowed patients to take mental trip to a [place]. Recreation Therapy Intern played nature music. Patients were to follow along as script was read to engage in the activity.  Education: Stress Management, Discharge Planning.   Education Outcome: Acknowledges edcuation  Clinical Observations/Feedback: Pt attended group.  Donovan Kail, Recreation Therapy Intern  Victorino Sparrow, LRT/CTRS

## 2017-01-05 NOTE — Progress Notes (Signed)
Pt reports she has had a good day.  She attended evening karaoke and said she enjoyed it even though she did participate.  She denies SI/HI.  She says she is still hearing the voices at times, but they are decreased with the medications.  She says that the plan is for her to discharge on Saturday.  She also plans to stay away from negative people in her life.  Pt has been pleasant and cooperative.  She makes her needs known to staff.  Support and encouragement offered.  Discharge plans are in process.  Safety maintained with q15 minute checks.

## 2017-01-05 NOTE — Progress Notes (Signed)
Data. Patient denies SI/HI/AVH. Patient interacting well with staff and other patients. Patient's affect is flat, but does brighten with interactions. Patient C/O continuing, "Constipation" and "Hemeriods, that are coming out." NP notified and new orders received. Action. Emotional support and encouragement offered. Education provided on medication, indications and side effect. Q 15 minute checks done for safety. Response. Safety on the unit maintained through 15 minute checks.  Medications taken as prescribed. Attended groups. Remained calm and appropriate through out shift.

## 2017-01-05 NOTE — Progress Notes (Signed)
[facility] MD Progress Note  01/05/2017 4:46 PM Ashley Patrick  MRN:  [id number]   Subjective: Im beginning to do better. My depression is getting better. I think Im ready to go home tomorrow.   Objective:  I have discussed case with treatment team and have met with patient . As discussed with staff, she has presented with partial improvement . She states she is ready to go home tomorrow, and has made preparation to do so at this time. SHe continues to work on meeting her goals of staying positive. Upon discharge she reports wanting to take her medication as directed, keeping her appointments and worrying about herself. She has endorses improvement in insight and judgement at this time. She denies any disturbances of eating and sleeping. As she improves she is becoming somewhat more visible on unit, behavior in good control. Limited group participation. Currently denies medication side effects . Principal Problem: MDD (major depressive disorder), recurrent, severe, with psychosis ([place]) Diagnosis:   Patient Active Problem List   Diagnosis Date Noted  . MDD (major depressive disorder), recurrent, severe, with psychosis ([place]) [F33.3] 12/30/2016  . Tobacco abuse counseling [Z71.6] 09/13/2016  . Abnormal CT scan, colon [R93.3] 05/02/2016  . GERD (gastroesophageal reflux disease) [K21.9] 05/02/2016  . Hiatal hernia [K44.9]   . History of colonic polyps [Z86.010]   . Diverticulosis of colon without hemorrhage [K57.30]   . Constipation [K59.00] 07/22/2014  . History of colon cancer [Z85.038] 07/20/2014  . Dysphagia [R13.10] 07/20/2014  . Lower abdominal pain [R10.30] 09/30/2013  . Bipolar affective disorder, current episode depressed ([place]) [F31.30] 08/08/2013  . Anxiety, generalized [F41.1] 08/08/2013  . 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 Noncompliance with medications [Z91.14] 08/07/2013  . Colon cancer ([place]) [C18.9] 06/23/2013  . Rectal bleeding [K62.5]  05/09/2013  . Gastritis [K29.70] 05/09/2013  . LLQ pain [R10.32] 11/28/2012  . Hemorrhagic cyst of ovary [N83.209] 11/28/2012  . SHOULDER PAIN, LEFT [M25.519] 05/18/2010  . ACUTE BRONCHITIS [J20.9] 09/03/2008  . UNSPECIFIED URINARY INCONTINENCE [R32] 09/03/2008  . ACUTE CYSTITIS [N30.00] 07/19/2008  . Abdominal pain, generalized [R10.84] 07/09/2008  . OBESITY [E66.9] 11/22/2007  . UNSPECIFIED PSYCHOSIS [F29] 11/22/2007  . MIGRAINE HEADACHE [G43.909] 11/22/2007  . HYPERTENSION [I10] 11/22/2007  . BACK PAIN [M54.9] 11/22/2007  . LATERAL EPICONDYLITIS OF ELBOW [M77.10] 11/22/2007   Total Time spent with patient: 20 minutes  Past Psychiatric History: See H&P  Past Medical History:  Past Medical History:  Diagnosis Date  . Back pain   . Bipolar 1 disorder ([place])   . Cancer [facility]) 2014   Colon Cancer  . Hypercholesteremia   . Hypertension   . Lateral epicondylitis  of elbow    right  . Migraine headache   . Nicotine addiction   . Obesity    History of  . OD (overdose of drug)    hospitalized for 11 days   . Psychosis     Past Surgical History:  Procedure Laterality Date  . ABDOMINAL HYSTERECTOMY    . BIOPSY N/A 08/13/2014   Procedure: BIOPSY;  Surgeon: Daneil Dolin, MD;  Location: [facility] ORS;  Service: Endoscopy;  Laterality: N/A;  . BREAST BIOPSY  10/18/2011   Procedure: BREAST BIOPSY;  Surgeon: Jamesetta So, MD;  Location: [facility] ORS;  Service: General;  Laterality: Left;  . COLON RESECTION N/A 06/23/2013   Procedure: HAND ASSISTED LAPAROSCOPIC PARTIAL COLECTOMY;  Surgeon: Jamesetta So, MD;  Location: [facility] ORS;  Service: General;  Laterality: N/A;  .  COLONOSCOPY    . COLONOSCOPY N/A 05/29/2013   LZJ:[id number] mass most likely representing colorectal cancer S/ P biospy.Multiple colonic and rectal polyps removed/treated as described above. Colonic diverticulosis  . COLONOSCOPY WITH PROPOFOL N/A 08/13/2014   RMR: Status post sigmoid colectomy. Multiple colonic polyps removed as  described above. Redundant colon. Pan colonic diverticuloisi  . COLONOSCOPY WITH PROPOFOL N/A 05/22/2016   Procedure: COLONOSCOPY WITH PROPOFOL;  Surgeon: Daneil Dolin, MD;  Location: [facility] ENDO SUITE;  Service: Endoscopy;  Laterality: N/A;  7:30 am  . ESOPHAGEAL DILATION N/A 08/13/2014   Procedure: [place];  Surgeon: Daneil Dolin, MD;  Location: [facility] ORS;  Service: Endoscopy;  Laterality: N/A;  . ESOPHAGOGASTRODUODENOSCOPY N/A 12/26/2012   XTK:WIOXBDZ reflux esophagitis. Gastric and duodenal bulbar erosions-status post gastric biopsynegative H.pylori  . ESOPHAGOGASTRODUODENOSCOPY (EGD) WITH PROPOFOL N/A 08/13/2014   RMR: Small benign cystic-appearing lesion in distal esophagus of doubtful clinical significance, otherwise normal esophagus, status post Maloney dilation. Small hiatal hernia, some retained gastric contents (query delayed gastric emptying.)  . partial hysterectomy    . POLYPECTOMY N/A 08/13/2014   Procedure: POLYPECTOMY;  Surgeon: Daneil Dolin, MD;  Location: [facility] ORS;  Service: Endoscopy;  Laterality: N/A;   Family History:  Family History  Problem Relation Age of Onset  . Colon cancer Father        diagnosed in his 61s  . Anesthesia problems Neg Hx   . Hypotension Neg Hx   . Malignant hyperthermia Neg Hx   . Pseudochol deficiency Neg Hx    Family Psychiatric  History: See H&P Social History:  History  Alcohol Use No     History  Drug Use No    Social History   Social History  . Marital status: Legally Separated    Spouse name: N/A  . Number of children: 3  . Years of education: N/A   Social History Main Topics  . Smoking status: Current Every Day Smoker    Packs/day: 0.50    Years: 38.00    Types: Cigarettes    Last attempt to quit: 06/19/2014  . Smokeless tobacco: Never Used     Comment: vapor only  . Alcohol use No  . Drug use: No  . Sexual activity: Yes    Birth control/ protection: Surgical   Other Topics Concern  . None    Social History Narrative  . None   Additional Social History:     Sleep: improving   Appetite:  Good  Current Medications: Current Facility-Administered Medications  Medication Dose Route Frequency Provider Last Rate Last Dose  . acetaminophen (TYLENOL) tablet 650 mg  650 mg Oral Q6H PRN Okonkwo, Justina A, NP      . alum & mag hydroxide-simeth (MAALOX/MYLANTA) 200-200-20 MG/5ML suspension 30 mL  30 mL Oral Q4H PRN Okonkwo, Justina A, NP      . amLODipine (NORVASC) tablet 5 mg  5 mg Oral Daily Okonkwo, Justina A, NP   5 mg at 01/05/17 0831   And  . benazepril (LOTENSIN) tablet 10 mg  10 mg Oral Daily Okonkwo, Justina A, NP   10 mg at 01/05/17 3299  . FLUoxetine (PROZAC) capsule 20 mg  20 mg Oral Daily Cobos, Myer Peer, MD   20 mg at 01/05/17 0831  . fluticasone (FLONASE) 50 MCG/ACT nasal spray 1 spray  1 spray Each Nare Daily PRN Okonkwo, Justina A, NP      . hydrochlorothiazide (HYDRODIURIL) tablet 50 mg  50 mg Oral  Daily Okonkwo, Justina A, NP   50 mg at 01/05/17 0831  . hydrocortisone (ANUSOL-HC) 2.5 % rectal cream   Rectal BID Nanci Pina, FNP      . hydrOXYzine (ATARAX/VISTARIL) tablet 25 mg  25 mg Oral TID PRN Lu Duffel, Justina A, NP   25 mg at 01/04/17 2224  . magnesium hydroxide (MILK OF MAGNESIA) suspension 30 mL  30 mL Oral Daily PRN Okonkwo, Justina A, NP   30 mL at 01/05/17 4536  . nicotine polacrilex (NICORETTE) gum 2 mg  2 mg Oral PRN Lindon Romp A, NP   2 mg at 01/05/17 1250  . pantoprazole (PROTONIX) EC tablet 40 mg  40 mg Oral Daily Okonkwo, Justina A, NP   40 mg at 01/05/17 0831  . risperiDONE (RISPERDAL) tablet 2 mg  2 mg Oral QHS Money, Lowry Ram, FNP   2 mg at 01/04/17 2224  . risperiDONE (RISPERDAL) tablet 2 mg  2 mg Oral BH-q7a Cobos, Myer Peer, MD   2 mg at 01/05/17 0641  . topiramate (TOPAMAX) tablet 25 mg  25 mg Oral Daily Cobos, Myer Peer, MD   25 mg at 01/05/17 4680    Lab Results:  No results found for this or any previous visit (from the past  48 hour(s)).  Blood Alcohol level:  Lab Results  Component Value Date   ETH <5 12/29/2016   ETH 5 (H) 32/05/2481    Metabolic Disorder Labs: Lab Results  Component Value Date   HGBA1C 5.5 01/01/2017   MPG 111 01/01/2017   MPG 114 02/29/2016   Lab Results  Component Value Date   PROLACTIN 5.3 01/01/2017   Lab Results  Component Value Date   CHOL 256 (H) 01/01/2017   TRIG 177 (H) 01/01/2017   HDL 56 01/01/2017   CHOLHDL 4.6 01/01/2017   VLDL 35 01/01/2017   LDLCALC 165 (H) 01/01/2017   LDLCALC 122 (H) 02/29/2016    Physical Findings: AIMS: Facial and Oral Movements Muscles of Facial Expression: None, normal Lips and Perioral Area: None, normal Jaw: None, normal Tongue: None, normal,Extremity Movements Upper (arms, wrists, hands, fingers): None, normal Lower (legs, knees, ankles, toes): None, normal, Trunk Movements Neck, shoulders, hips: None, normal, Overall Severity Severity of abnormal movements (highest score from questions above): None, normal Incapacitation due to abnormal movements: None, normal Patient's awareness of abnormal movements (rate only patient's report): No Awareness, Dental Status Current problems with teeth and/or dentures?: No Does patient usually wear dentures?: No  CIWA:  CIWA-Ar Total: 1 COWS:  COWS Total Score: 2  Musculoskeletal: Strength & Muscle Tone: within normal limits Gait & Station: normal Patient leans: N/A  Psychiatric Specialty Exam: Physical Exam   ROS  no chest pain, no shortness of breath, no vomiting   Blood pressure 121/74, pulse (!) 103, temperature 97.9 F (36.6 C), temperature source Oral, resp. rate 18, height _0  (1.626 m), weight 86.2 kg (190 lb), SpO2 100 %.Body mass index is 32.61 kg/m.  General Appearance: Grooming has improved compared to admission  Eye Contact:  Good  Speech:  improving , less monotone, more verbal overall   Volume:  Normal  Mood:  acknowledges partial mood improvement   Affect:   Appropriate and Congruent  Thought Process:  Linear and Descriptions of Associations: Intact  Orientation:  Other:  fully alert and attentive  Thought Content:  chronic hallucinations, including chronic visual hallucinations, reports hallucinations have subsided over the last 2 days , and is currently not internally preoccupied, no  clear delusional ideations are expressed   Suicidal Thoughts:  No denies suicidal or self injurious ideations  Homicidal Thoughts:  No denies violent or homicidal ideations towards anyone at this time, and also specifically denies any HI or violent plan or intention towards his daughter's BF .   Memory:  recent and remote grossly intact   Judgement:  Fair- improving   Insight:  Fair- improving   Psychomotor Activity:  Normal  Concentration:  Concentration: Good and Attention Span: Good  Recall:  Good  Fund of Knowledge:  Good  Language:  Good  Akathisia:  No  Handed:  Right  AIMS (if indicated):     Assets:  Financial Resources/Insurance Social Support  ADL's:  Intact  Cognition:  WNL  Sleep:  Number of Hours: 5.25    Assessment - patient is presenting with a partially and gradually  improving mood. Currently denies SI, denies any current violent or homicidal ideations. Of note, she has history of chronic , persistent hallucinations, including visual halls, but states that she has not had any hallucinations x 1-2 days .  Thus far she is tolerating Prozac/Risperidone combination well.  She has tolerated Valium taper well . Of note, patient presented with some oral dyskinesias on admission-I have had conversation with her about this and she understands risk of movement disorder such as TD on current medication , but dyskinesias have resolved since admission, as Risperidone titrated .   Treatment Plan Summary: Treatment plan reviewed as below today 7/19 Encourage group and milieu participation to work on coping skills and symptom reduction Continue  Risperidone  to 2 mgr BID  for psychosis  Continue  Prozac  20 mgrs  QDAY for depression At this time would discontinue  Valium. Patient has agreed with BZD taper/ discontinuation.  Continue Topamax 25 mgrs QDAY for mood disorder  Continue Vistaril 25 mgrs Q 8 hours PRN for anxiety  Treatment team working on disposition planning options  Patient plans to return home at discharge.   *Of note, Nursing staff reports that Breast Work up ( Ultrasound, MM Bilateral Tomo) were ordered by Dr. Quintella Reichert. I have reviewed this with patient.  Patient states she has a history of breast lump and that during a previous visit to PCP, following up on this finding had been recommended . Nursing staff have looked into these orders , found that  they were ordered at [facility], and that they were cancelled as patient currently admitted, with instruction that patient call after discharge in order to reschedule. Patient expresses understanding . Nanci Pina, FNP 01/05/2017, 4:46 PM   Patient ID: [name], female   DOB: 1956-05-22, 61 y.o.   MRN: [id number]

## 2017-01-05 NOTE — [facility] Group Notes (Signed)
[place] LCSW Group Therapy 01/05/2017 1:15pm  Type of Therapy: Group Therapy- Feelings Around Relapse and Recovery  Participation Level: Active   Participation Quality:  Appropriate  Affect:  Appropriate  Cognitive: Alert and Oriented   Insight:  Developing   Engagement in Therapy: Developing/Improving and Engaged   Modes of Intervention: Clarification, Confrontation, Discussion, Education, Exploration, Limit-setting, Orientation, Problem-solving, Rapport Building, Art therapist, Socialization and Support  Summary of Progress/Problems: The topic for today was feelings about relapse. The group discussed what relapse prevention is to them and identified triggers that they are on the path to relapse. Members also processed their feeling towards relapse and were able to relate to common experiences. Group also discussed coping skills that can be used for relapse prevention.  Pt was active in discussion and identified warning signs and triggers. She was able to relate to peers regarding unhelpful thinking patterns.    Therapeutic Modalities:   Cognitive Behavioral Therapy Solution-Focused Therapy Assertiveness Training Relapse Prevention Therapy    Gaspar Cola [phone] 01/05/2017 4:21 PM

## 2017-01-05 NOTE — Tx Team (Signed)
Interdisciplinary Treatment and Diagnostic Plan Update  01/05/2017 Time of Session: 9:30am PRERANA STRAYER MRN: [id number]  Principal Diagnosis: MDD, severe, with psychotic features   Secondary Diagnoses: Principal Problem:   MDD (major depressive disorder), recurrent, severe, with psychosis ([place])   Current Medications:  Current Facility-Administered Medications  Medication Dose Route Frequency Provider Last Rate Last Dose  . acetaminophen (TYLENOL) tablet 650 mg  650 mg Oral Q6H PRN Okonkwo, Justina A, NP      . alum & mag hydroxide-simeth (MAALOX/MYLANTA) 200-200-20 MG/5ML suspension 30 mL  30 mL Oral Q4H PRN Okonkwo, Justina A, NP      . amLODipine (NORVASC) tablet 5 mg  5 mg Oral Daily Okonkwo, Justina A, NP   5 mg at 01/05/17 0831   And  . benazepril (LOTENSIN) tablet 10 mg  10 mg Oral Daily Okonkwo, Justina A, NP   10 mg at 01/05/17 6195  . FLUoxetine (PROZAC) capsule 20 mg  20 mg Oral Daily Cobos, Myer Peer, MD   20 mg at 01/05/17 0831  . fluticasone (FLONASE) 50 MCG/ACT nasal spray 1 spray  1 spray Each Nare Daily PRN Okonkwo, Justina A, NP      . hydrochlorothiazide (HYDRODIURIL) tablet 50 mg  50 mg Oral Daily Okonkwo, Justina A, NP   50 mg at 01/05/17 0831  . hydrOXYzine (ATARAX/VISTARIL) tablet 25 mg  25 mg Oral TID PRN Hughie Closs A, NP   25 mg at 01/04/17 2224  . magnesium hydroxide (MILK OF MAGNESIA) suspension 30 mL  30 mL Oral Daily PRN Okonkwo, Justina A, NP   30 mL at 01/05/17 0932  . nicotine polacrilex (NICORETTE) gum 2 mg  2 mg Oral PRN Lindon Romp A, NP   2 mg at 01/05/17 0836  . pantoprazole (PROTONIX) EC tablet 40 mg  40 mg Oral Daily Okonkwo, Justina A, NP   40 mg at 01/05/17 0831  . risperiDONE (RISPERDAL) tablet 2 mg  2 mg Oral QHS Money, Lowry Ram, FNP   2 mg at 01/04/17 2224  . risperiDONE (RISPERDAL) tablet 2 mg  2 mg Oral BH-q7a Cobos, Myer Peer, MD   2 mg at 01/05/17 0641  . topiramate (TOPAMAX) tablet 25 mg  25 mg Oral Daily Cobos, Myer Peer, MD    25 mg at 01/05/17 6712    PTA Medications: Prescriptions Prior to Admission  Medication Sig Dispense Refill Last Dose  . amLODipine-benazepril (LOTREL) 5-10 MG capsule    12/29/2016 at Unknown time  . ARIPiprazole (ABILIFY) 30 MG tablet Take 30 mg by mouth daily.    12/29/2016 at Unknown time  . diazepam (VALIUM) 5 MG tablet Take 5 mg by mouth 3 (three) times daily.   12/29/2016 at Unknown time  . fluticasone (FLONASE) 50 MCG/ACT nasal spray Place 1 spray into both nostrils daily as needed for allergies.    Past Week at Unknown time  . hydrochlorothiazide (HYDRODIURIL) 50 MG tablet Take 50 mg by mouth daily.   12/29/2016 at Unknown time  . omeprazole (PRILOSEC) 20 MG capsule Take 20 mg by mouth daily.   12/29/2016 at Unknown time  . topiramate (TOPAMAX) 50 MG tablet Take 50 mg by mouth daily.   12/29/2016 at Unknown time    Treatment Modalities: Medication Management, Group therapy, Case management,  1 to 1 session with clinician, Psychoeducation, Recreational therapy.  Patient Stressors: Financial difficulties Health problems Medication change or noncompliance  Patient Strengths: Ability for insight Average or above average intelligence Capable of independent living Communication  skills  Physician Treatment Plan for Primary Diagnosis: MDD, severe, with psychotic features  Long Term Goal(s): Improvement in symptoms so as ready for discharge  Short Term Goals: Ability to identify changes in lifestyle to reduce recurrence of condition will improve Ability to verbalize feelings will improve Ability to disclose and discuss suicidal ideas Ability to demonstrate self-control will improve Ability to identify and develop effective coping behaviors will improve Ability to maintain clinical measurements within normal limits will improve Compliance with prescribed medications will improve Ability to verbalize feelings will improve Ability to disclose and discuss suicidal ideas Ability to  demonstrate self-control will improve Ability to identify and develop effective coping behaviors will improve Ability to maintain clinical measurements within normal limits will improve Compliance with prescribed medications will improve  Medication Management: Evaluate patient's response, side effects, and tolerance of medication regimen.  Therapeutic Interventions: 1 to 1 sessions, Unit Group sessions and Medication administration.  Evaluation of Outcomes: Adequate for Discharge  Physician Treatment Plan for Secondary Diagnosis: Principal Problem:   MDD (major depressive disorder), recurrent, severe, with psychosis ([place])   Long Term Goal(s): Improvement in symptoms so as ready for discharge  Short Term Goals: Ability to identify changes in lifestyle to reduce recurrence of condition will improve Ability to verbalize feelings will improve Ability to disclose and discuss suicidal ideas Ability to demonstrate self-control will improve Ability to identify and develop effective coping behaviors will improve Ability to maintain clinical measurements within normal limits will improve Compliance with prescribed medications will improve Ability to verbalize feelings will improve Ability to disclose and discuss suicidal ideas Ability to demonstrate self-control will improve Ability to identify and develop effective coping behaviors will improve Ability to maintain clinical measurements within normal limits will improve Compliance with prescribed medications will improve  Medication Management: Evaluate patient's response, side effects, and tolerance of medication regimen.  Therapeutic Interventions: 1 to 1 sessions, Unit Group sessions and Medication administration.  Evaluation of Outcomes: Adequate for Discharge   RN Treatment Plan for Primary Diagnosis: MDD, severe, with psychotic features  Long Term Goal(s): Knowledge of disease and therapeutic regimen to maintain health will  improve  Short Term Goals: Ability to verbalize feelings will improve, Ability to disclose and discuss suicidal ideas and Ability to identify and develop effective coping behaviors will improve  Medication Management: RN will administer medications as ordered by provider, will assess and evaluate patient's response and provide education to patient for prescribed medication. RN will report any adverse and/or side effects to prescribing provider.  Therapeutic Interventions: 1 on 1 counseling sessions, Psychoeducation, Medication administration, Evaluate responses to treatment, Monitor vital signs and CBGs as ordered, Perform/monitor CIWA, COWS, AIMS and Fall Risk screenings as ordered, Perform wound care treatments as ordered.  Evaluation of Outcomes: Adequate for Discharge   LCSW Treatment Plan for Primary Diagnosis: MDD, severe, with psychotic features  Long Term Goal(s): Safe transition to appropriate next level of care at discharge, Engage patient in therapeutic group addressing interpersonal concerns.  Short Term Goals: Engage patient in aftercare planning with referrals and resources, Identify triggers associated with mental health/substance abuse issues and Increase skills for wellness and recovery  Therapeutic Interventions: Assess for all discharge needs, 1 to 1 time with Social worker, Explore available resources and support systems, Assess for adequacy in community support network, Educate family and significant other(s) on suicide prevention, Complete Psychosocial Assessment, Interpersonal group therapy.  Evaluation of Outcomes: Adequate for Discharge   Progress in Treatment: Attending groups: Yes  Participating in  groups: Yes Taking medication as prescribed: Yes, MD continues to assess for medication changes as needed Toleration medication: Yes, no side effects reported at this time Family/Significant other contact made: Yes with daughter Patient understands diagnosis:  Developing insight Discussing patient identified problems/goals with staff: Yes Medical problems stabilized or resolved: Yes Denies suicidal/homicidal ideation: Yes Issues/concerns per patient self-inventory: None Other: N/A  New problem(s) identified: Pt came in with HI towards son-in-law; CSW to assess appropriate actions with security team.   7/20: See chart note: no Duty to Warn required  New Short Term/Long Term Goal(s): None identified at this time.   Discharge Plan or Barriers: Pt will return home and follow-up with outpatient [facility] at [facility]   Reason for Continuation of Hospitalization: Depression  Estimated Length of Stay: 1 day; Est DC date 7/21  Attendees: Patient: 01/05/2017  10:08 AM  Physician: Dr. Dwyane Dee, Dr. Sanjuana Letters 01/05/2017  10:08 AM  Nursing: Ammie Ferrier; RN 01/05/2017  10:08 AM  RN Care Manager: Lars Pinks, RN 01/05/2017  10:08 AM  Social Worker: Adriana Reams, LCSW 01/05/2017  10:08 AM  Recreational Therapist:  01/05/2017  10:08 AM  Other: Lindell Spar, NP; Ricky Ala, NP; Marvia Pickles, RN 01/05/2017  10:08 AM  Other:  01/05/2017  10:08 AM  Other: 01/05/2017  10:08 AM    Scribe for Treatment Team: Gladstone Lighter, LCSW 01/05/2017 10:08 AM

## 2017-01-06 DIAGNOSIS — Z811 Family history of alcohol abuse and dependence: Secondary | ICD-10-CM

## 2017-01-06 DIAGNOSIS — Z818 Family history of other mental and behavioral disorders: Secondary | ICD-10-CM

## 2017-01-06 DIAGNOSIS — F17210 Nicotine dependence, cigarettes, uncomplicated: Secondary | ICD-10-CM

## 2017-01-06 MED ORDER — HYDROXYZINE HCL 25 MG PO TABS: 25.0000 mg | ORAL_TABLET | Freq: Three times a day (TID) | ORAL | 0 refills | Status: DC | PRN

## 2017-01-06 MED ORDER — RISPERIDONE 2 MG PO TABS: 2.0000 mg | ORAL_TABLET | Freq: Every day | ORAL | 0 refills | Status: DC

## 2017-01-06 MED ORDER — TOPIRAMATE 25 MG PO TABS: 25.0000 mg | ORAL_TABLET | Freq: Every day | ORAL | 0 refills | Status: DC

## 2017-01-06 MED ORDER — RISPERIDONE 2 MG PO TABS: 2.0000 mg | ORAL_TABLET | ORAL | 0 refills | Status: DC

## 2017-01-06 MED ORDER — FLUOXETINE HCL 20 MG PO CAPS: 20.0000 mg | ORAL_CAPSULE | Freq: Every day | ORAL | 0 refills | Status: DC

## 2017-01-06 MED ORDER — NICOTINE POLACRILEX 2 MG MT GUM: 2.0000 mg | CHEWING_GUM | OROMUCOSAL | 0 refills | Status: DC | PRN

## 2017-01-06 MED ORDER — PANTOPRAZOLE SODIUM 40 MG PO TBEC: 40.0000 mg | DELAYED_RELEASE_TABLET | Freq: Every day | ORAL | 0 refills | Status: DC

## 2017-01-06 MED ORDER — HYDROCORTISONE 2.5 % RE CREA: TOPICAL_CREAM | Freq: Two times a day (BID) | RECTAL | 0 refills | Status: DC

## 2017-01-06 NOTE — [facility] Group Notes (Signed)
Adult Therapy Group Note  Date:  01/06/2017  Time:  10:00-11:00AM  Group Topic/Focus: Unhealthy vs Healthy Coping Techniques  Building Self Esteem:    The focus of this group was to determine what unhealthy coping techniques typically are used or and what healthy coping techniques would be helpful in coping with various problems. Patients were guided in becoming aware of the differences between healthy and unhealthy coping techniques.  Vignettes were used to generate ideas, which led to a discussion about personal application.     Participation Level:  Active  Participation Quality:  Attentive and Sharing  Affect:  Blunted  Cognitive:  Oriented  Insight: Improving  Engagement in Group:  Developing/Improving  Modes of Intervention:  Exercise, Discussion and Support  Additional Comments:  The patient expressed herself several times during group, contributing some humorous remarks, was appropriate and attentive throughout.  Selmer Dominion, LCSW 01/06/2017   1:00 PM

## 2017-01-06 NOTE — Progress Notes (Signed)
Data. Patient denies SI/HI/AVH. Patient interacting well with staff and other patients. Affect bright. Patient reports the Anusol creme, "Worked well. I have much less pain."  Action. Emotional support and encouragement offered. Education provided on medication, indications and side effect. Q 15 minute checks done for safety. Response. Safety on the unit maintained through 15 minute checks.  Medications taken as prescribed. Attended groups. Remained calm and appropriate through out shift.  Pt. discharged to lobby.  Belongings sheet reviewed and signed by pt. and all belongings, including scripts,  sent home. Paperwork reviewed and pt. able to verbalize understanding of education. Pt. in no current distress and ambulatory.
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
## 2017-01-06 NOTE — Progress Notes (Signed)
Pt is excited that tomorrow she is going to be discharged home.  She denies SI/HI/AVH.  Her only complaint is that she is having issues with hemorrhoids and has been waiting for pharmacy to sent the Prep-H that was ordered earlier for her.  Writer notified pharmacy again after speaking with the patient.  Pt voices no other needs or concern.  Pt has bee pleasant and cooperative.  Support and encouragement offered.  Discharge plans are in process.  Safety maintained with q15 minute checks.
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
## 2017-01-10 ENCOUNTER — Encounter: Payer: Self-pay | Admitting: *Deleted

## 2017-01-22 ENCOUNTER — Other Ambulatory Visit (HOSPITAL_COMMUNITY): Payer: Self-pay | Admitting: Family Medicine

## 2017-01-22 DIAGNOSIS — R921 Mammographic calcification found on diagnostic imaging of breast: Secondary | ICD-10-CM

## 2017-01-22 DIAGNOSIS — Z09 Encounter for follow-up examination after completed treatment for conditions other than malignant neoplasm: Secondary | ICD-10-CM

## 2017-01-29 ENCOUNTER — Ambulatory Visit (INDEPENDENT_AMBULATORY_CARE_PROVIDER_SITE_OTHER): Payer: Medicare Other | Admitting: Adult Health

## 2017-01-29 ENCOUNTER — Encounter: Payer: Self-pay | Admitting: Adult Health

## 2017-01-29 VITALS — BP 132/90 | HR 60 | Ht 64.0 in | Wt 196.0 lb

## 2017-01-29 DIAGNOSIS — Z1212 Encounter for screening for malignant neoplasm of rectum: Secondary | ICD-10-CM | POA: Diagnosis not present

## 2017-01-29 DIAGNOSIS — Z1211 Encounter for screening for malignant neoplasm of colon: Secondary | ICD-10-CM | POA: Diagnosis not present

## 2017-01-29 DIAGNOSIS — Z01810 Encounter for preprocedural cardiovascular examination: Secondary | ICD-10-CM | POA: Insufficient documentation

## 2017-01-29 DIAGNOSIS — Z01419 Encounter for gynecological examination (general) (routine) without abnormal findings: Secondary | ICD-10-CM | POA: Diagnosis not present

## 2017-01-29 LAB — HEMOCCULT GUIAC POC 1CARD (OFFICE): FECAL OCCULT BLD: NEGATIVE

## 2017-01-29 NOTE — Progress Notes (Signed)
Patient ID: Ashley Patrick, female   DOB: 12-30-1955, 61 y.o.   MRN: [id number] History of Present Illness: Ashley Patrick is a 61 year old black female in for a well woman gyn exam, she is sp hysterectomy.She said had hysterectomy for bleeding and fibroids. PCP is Dr Maudie Mercury.    Current Medications, Allergies, Past Medical History, Past Surgical History, Family History and Social History were reviewed in [employer] record.     Review of Systems:  Patient denies any headaches, hearing loss, fatigue, blurred vision, shortness of breath, chest pain, abdominal pain, problems with bowel movements, urination, or intercourse. No joint pain or mood swings.   Physical Exam:BP 132/90 (BP Location: Left Arm, Patient Position: Sitting, Cuff Size: Normal)   Pulse 60   Ht 5\' 4"  (1.626 m)   Wt 196 lb (88.9 kg)   BMI 33.64 kg/m  General:  Well developed, well nourished, no acute distress Skin:  Warm and dry Neck:  Midline trachea, normal thyroid, good ROM, no lymphadenopathy Lungs; Clear to auscultation bilaterally Breast:  No dominant palpable mass, retraction, or nipple discharge Cardiovascular: Regular rate and rhythm Abdomen:  Soft, non tender, no hepatosplenomegaly Pelvic:  External genitalia is normal in appearance, no lesions.  The vagina is normal in appearance. Urethra has no lesions or masses. The cervix and uterus is absent.No adnexal masses or tenderness noted.Bladder is non tender, no masses felt. Rectal: Good sphincter tone, no polyps, or hemorrhoids felt.  Hemoccult negative. Extremities/musculoskeletal:  No swelling or varicosities noted, no clubbing or cyanosis Psych:  No mood changes, alert and cooperative,seems happy PHQ 9 score 10, is on meds and sees [facility], denies any suicidal ideations at this time.   Impression: 1. Well woman exam with routine gynecological exam   2. Screening for colorectal cancer       Plan: Physical in 1 year Labs per  PCP Mammogram yearly Colonoscopy per GI

## 2017-02-13 ENCOUNTER — Encounter (HOSPITAL_COMMUNITY): Payer: Self-pay | Admitting: Physical Therapy

## 2017-02-13 NOTE — Therapy (Signed)
[facility] [place], [place], [address] Phone: [phone]   Fax:  [phone]  Patient Details  Name: Ashley Patrick MRN: [id number] Date of Birth: 10-31-1955 Referring Provider:  No ref. provider found  Encounter Date: 02/13/2017   PHYSICAL THERAPY DISCHARGE SUMMARY  Visits from Start of Care: 7  Current functional level related to goals / functional outcomes: Patient did not return since last skilled treatment session, DC per policy    Remaining deficits: Unable to assess    Education / Equipment: None  Plan: Patient agrees to discharge.  Patient goals were not met. Patient is being discharged due to being pleased with the current functional level.  ?????       Deniece Ree PT, DPT [facility] [address] [place], [place], [address] Phone: [phone]   Fax:  [phone]

## 2017-03-01 ENCOUNTER — Other Ambulatory Visit (HOSPITAL_COMMUNITY): Payer: Self-pay | Admitting: Family Medicine

## 2017-03-01 DIAGNOSIS — Z09 Encounter for follow-up examination after completed treatment for conditions other than malignant neoplasm: Secondary | ICD-10-CM

## 2017-03-01 DIAGNOSIS — R921 Mammographic calcification found on diagnostic imaging of breast: Secondary | ICD-10-CM

## 2017-03-13 ENCOUNTER — Ambulatory Visit (HOSPITAL_COMMUNITY)
Admission: RE | Admit: 2017-03-13 | Discharge: 2017-03-13 | Disposition: A | Discharge status: Home / Self Care | Payer: Medicare Other | Source: Ambulatory Visit | Attending: Family Medicine | Admitting: Family Medicine

## 2017-03-13 DIAGNOSIS — Z09 Encounter for follow-up examination after completed treatment for conditions other than malignant neoplasm: Secondary | ICD-10-CM

## 2017-03-13 DIAGNOSIS — R921 Mammographic calcification found on diagnostic imaging of breast: Secondary | ICD-10-CM

## 2017-03-21 ENCOUNTER — Encounter: Payer: Self-pay | Admitting: Gastroenterology

## 2017-03-21 ENCOUNTER — Ambulatory Visit: Payer: Medicare Other | Admitting: Gastroenterology

## 2017-03-21 ENCOUNTER — Telehealth: Payer: Self-pay | Admitting: Gastroenterology

## 2017-03-21 NOTE — Telephone Encounter (Signed)
PATIENT WAS A NO SHOW AND LETTER SENT  °

## 2017-04-09 DIAGNOSIS — M25551 Pain in right hip: Secondary | ICD-10-CM | POA: Diagnosis not present

## 2017-04-09 DIAGNOSIS — M79604 Pain in right leg: Secondary | ICD-10-CM | POA: Diagnosis not present

## 2017-04-09 DIAGNOSIS — Z23 Encounter for immunization: Secondary | ICD-10-CM | POA: Diagnosis not present

## 2017-04-09 DIAGNOSIS — M79605 Pain in left leg: Secondary | ICD-10-CM | POA: Diagnosis not present

## 2017-04-09 DIAGNOSIS — N390 Urinary tract infection, site not specified: Secondary | ICD-10-CM | POA: Diagnosis not present

## 2017-04-09 DIAGNOSIS — M25552 Pain in left hip: Secondary | ICD-10-CM | POA: Diagnosis not present

## 2017-04-20 ENCOUNTER — Other Ambulatory Visit (HOSPITAL_COMMUNITY): Payer: Self-pay | Admitting: Family Medicine

## 2017-04-20 ENCOUNTER — Ambulatory Visit (HOSPITAL_COMMUNITY)
Admission: RE | Admit: 2017-04-20 | Discharge: 2017-04-20 | Disposition: A | Discharge status: Home / Self Care | Payer: Medicare Other | Source: Ambulatory Visit | Attending: Family Medicine | Admitting: Family Medicine

## 2017-04-20 DIAGNOSIS — R52 Pain, unspecified: Secondary | ICD-10-CM

## 2017-04-20 DIAGNOSIS — M160 Bilateral primary osteoarthritis of hip: Secondary | ICD-10-CM | POA: Diagnosis not present

## 2017-04-20 DIAGNOSIS — M25551 Pain in right hip: Secondary | ICD-10-CM | POA: Diagnosis not present

## 2017-04-20 DIAGNOSIS — M79605 Pain in left leg: Secondary | ICD-10-CM | POA: Insufficient documentation

## 2017-04-20 DIAGNOSIS — M25552 Pain in left hip: Secondary | ICD-10-CM | POA: Insufficient documentation

## 2017-04-20 DIAGNOSIS — M79604 Pain in right leg: Secondary | ICD-10-CM | POA: Diagnosis not present

## 2017-04-30 DIAGNOSIS — N390 Urinary tract infection, site not specified: Secondary | ICD-10-CM | POA: Diagnosis not present

## 2017-04-30 DIAGNOSIS — H4423 Degenerative myopia, bilateral: Secondary | ICD-10-CM | POA: Diagnosis not present

## 2017-04-30 DIAGNOSIS — I10 Essential (primary) hypertension: Secondary | ICD-10-CM | POA: Diagnosis not present

## 2017-05-09 ENCOUNTER — Ambulatory Visit (HOSPITAL_COMMUNITY)
Admission: RE | Admit: 2017-05-09 | Discharge: 2017-05-09 | Disposition: A | Discharge status: Home / Self Care | Payer: Medicare Other | Source: Ambulatory Visit | Attending: Family Medicine | Admitting: Family Medicine

## 2017-05-09 DIAGNOSIS — N950 Postmenopausal bleeding: Secondary | ICD-10-CM | POA: Insufficient documentation

## 2017-05-09 DIAGNOSIS — Z13820 Encounter for screening for osteoporosis: Secondary | ICD-10-CM | POA: Diagnosis not present

## 2017-05-09 DIAGNOSIS — Z780 Asymptomatic menopausal state: Secondary | ICD-10-CM | POA: Insufficient documentation

## 2017-06-04 DIAGNOSIS — M25552 Pain in left hip: Secondary | ICD-10-CM | POA: Diagnosis not present

## 2017-06-04 DIAGNOSIS — M1611 Unilateral primary osteoarthritis, right hip: Secondary | ICD-10-CM | POA: Diagnosis not present

## 2017-06-05 DIAGNOSIS — M13851 Other specified arthritis, right hip: Secondary | ICD-10-CM | POA: Diagnosis not present

## 2017-06-05 DIAGNOSIS — M13861 Other specified arthritis, right knee: Secondary | ICD-10-CM | POA: Diagnosis not present

## 2017-06-05 DIAGNOSIS — I10 Essential (primary) hypertension: Secondary | ICD-10-CM | POA: Diagnosis not present

## 2017-06-06 DIAGNOSIS — M1611 Unilateral primary osteoarthritis, right hip: Secondary | ICD-10-CM | POA: Diagnosis not present

## 2017-10-25 DIAGNOSIS — R0789 Other chest pain: Secondary | ICD-10-CM | POA: Diagnosis not present

## 2017-10-25 DIAGNOSIS — K219 Gastro-esophageal reflux disease without esophagitis: Secondary | ICD-10-CM | POA: Diagnosis not present

## 2017-10-26 ENCOUNTER — Other Ambulatory Visit: Payer: Self-pay

## 2017-10-26 ENCOUNTER — Emergency Department (HOSPITAL_COMMUNITY): Payer: Medicare Other

## 2017-10-26 ENCOUNTER — Emergency Department (HOSPITAL_COMMUNITY)
Admission: EM | Admit: 2017-10-26 | Discharge: 2017-10-26 | Disposition: A | Discharge status: Home / Self Care | Payer: Medicare Other | Attending: Emergency Medicine | Admitting: Emergency Medicine

## 2017-10-26 ENCOUNTER — Encounter (HOSPITAL_COMMUNITY): Payer: Self-pay

## 2017-10-26 DIAGNOSIS — F17210 Nicotine dependence, cigarettes, uncomplicated: Secondary | ICD-10-CM | POA: Insufficient documentation

## 2017-10-26 DIAGNOSIS — R079 Chest pain, unspecified: Secondary | ICD-10-CM | POA: Diagnosis not present

## 2017-10-26 DIAGNOSIS — K2100 Gastro-esophageal reflux disease with esophagitis, without bleeding: Secondary | ICD-10-CM

## 2017-10-26 DIAGNOSIS — R0789 Other chest pain: Secondary | ICD-10-CM | POA: Diagnosis not present

## 2017-10-26 DIAGNOSIS — Z79899 Other long term (current) drug therapy: Secondary | ICD-10-CM | POA: Diagnosis not present

## 2017-10-26 DIAGNOSIS — I10 Essential (primary) hypertension: Secondary | ICD-10-CM | POA: Insufficient documentation

## 2017-10-26 DIAGNOSIS — Z85038 Personal history of other malignant neoplasm of large intestine: Secondary | ICD-10-CM | POA: Insufficient documentation

## 2017-10-26 LAB — BASIC METABOLIC PANEL
Anion gap: 7 (ref 5–15)
BUN: 14 mg/dL (ref 6–20)
CO2: 25 mmol/L (ref 22–32)
CREATININE: 0.86 mg/dL (ref 0.44–1.00)
Calcium: 9.3 mg/dL (ref 8.9–10.3)
Chloride: 109 mmol/L (ref 101–111)
GFR calc non Af Amer: >60 mL/min (ref >60)
Glucose, Bld: 104 mg/dL — ABNORMAL HIGH (ref 65–99)
Potassium: 3.2 mmol/L — ABNORMAL LOW (ref 3.5–5.1)
SODIUM: 141 mmol/L (ref 135–145)

## 2017-10-26 LAB — CBC
HEMATOCRIT: 38.5 % (ref 36.0–46.0)
HEMOGLOBIN: 13.2 g/dL (ref 12.0–15.0)
MCH: 29.9 pg (ref 26.0–34.0)
MCHC: 34.3 g/dL (ref 30.0–36.0)
MCV: 87.1 fL (ref 78.0–100.0)
Platelets: 209 10*3/uL (ref 150–400)
RBC: 4.42 MIL/uL (ref 3.87–5.11)
RDW: 13.8 % (ref 11.5–15.5)
WBC: 5.4 10*3/uL (ref 4.0–10.5)

## 2017-10-26 LAB — I-STAT TROPONIN, ED: Troponin i, poc: 0 ng/mL (ref 0.00–0.08)

## 2017-10-26 LAB — D-DIMER, QUANTITATIVE: D-Dimer, Quant: 0.29 ug/mL-FEU (ref 0.00–0.50)

## 2017-10-26 MED ORDER — TRAMADOL HCL 50 MG PO TABS: 50.0000 mg | ORAL_TABLET | Freq: Four times a day (QID) | ORAL | 0 refills | Status: DC | PRN

## 2017-10-26 MED ORDER — ASPIRIN 81 MG PO CHEW: 324.0000 mg | CHEWABLE_TABLET | Freq: Once | ORAL | Status: DC

## 2017-10-26 MED ORDER — PANTOPRAZOLE SODIUM 40 MG PO TBEC: 40.0000 mg | DELAYED_RELEASE_TABLET | Freq: Every day | ORAL | 0 refills | Status: DC

## 2017-10-26 MED ORDER — GI COCKTAIL ~~LOC~~
30.0000 mL | Freq: Once | ORAL | Status: AC
  Administered 2017-10-26: 30 mL via ORAL
  Filled 2017-10-26: qty 30

## 2017-10-26 MED ORDER — SODIUM CHLORIDE 0.9 % IV SOLN
INTRAVENOUS | Status: DC
  Administered 2017-10-26: 21:00:00 via INTRAVENOUS

## 2017-10-26 NOTE — ED Triage Notes (Signed)
Pt reports mid sternal chest pain x 3 weeks, states pain is worse with movement and deep breath.  Pt denies recent injury or strain.  Pt saw her pmd yesterday and given meds for her stomach. Pt has history of gerd

## 2017-10-26 NOTE — ED Provider Notes (Signed)
[facility] EMERGENCY DEPARTMENT Provider Note   CSN: [id number] Arrival date & time: 10/26/17  2016     History   Chief Complaint Chief Complaint  Patient presents with  . Chest Pain    HPI Ashley Patrick is a 62 y.o. female.  HPI Patient presents to the emergency room for evaluation of chest pain.  Patient states her symptoms started about 3 weeks ago.  She has had some pain in the center of her chest radiating towards her back.  Symptoms have been pretty constant over the last several weeks but have been increasing in intensity.  She has noticed some acid reflux symptoms.  Pain also increases when she moves around and takes a deep breath.  She saw her primary care doctor yesterday and was given some antacid medications.  She states she was also given a prescription for a cream but was unable to pick that medication up because of insurance approval issues.  She denies any history of heart disease.  No history of pulmonary embolism.  She has not noticed any fevers, coughing or leg swelling. Past Medical History:  Diagnosis Date  . Back pain   . Bipolar 1 disorder ([place])   . Cancer [facility]) 2014   Colon Cancer  . Hypercholesteremia   . Hypertension   . Lateral epicondylitis  of elbow    right  . Migraine headache   . Nicotine addiction   . Obesity    History of  . OD (overdose of drug)    hospitalized for 11 days   . Psychosis [facility])     Patient Active Problem List   Diagnosis Date Noted  . Well woman exam with routine gynecological exam 01/29/2017  . MDD (major depressive disorder), recurrent, severe, with psychosis ([place]) 12/30/2016  . Tobacco abuse counseling 09/13/2016  . Abnormal CT scan, colon 05/02/2016  . GERD (gastroesophageal reflux disease) 05/02/2016  . Hiatal hernia   . History of colonic polyps   . Diverticulosis of colon without hemorrhage   . Constipation 07/22/2014  . History of colon cancer 07/20/2014  . Dysphagia 07/20/2014  . Lower abdominal pain  09/30/2013  . Bipolar affective disorder, current episode depressed ([place]) 08/08/2013  . Anxiety, generalized 08/08/2013  . 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 Noncompliance with medications 08/07/2013  . Colon cancer ([place]) 06/23/2013  . Rectal bleeding 05/09/2013  . Gastritis 05/09/2013  . LLQ pain 11/28/2012  . Hemorrhagic cyst of ovary 11/28/2012  . SHOULDER PAIN, LEFT 05/18/2010  . ACUTE BRONCHITIS 09/03/2008  . UNSPECIFIED URINARY INCONTINENCE 09/03/2008  . ACUTE CYSTITIS 07/19/2008  . Abdominal pain, generalized 07/09/2008  . OBESITY 11/22/2007  . UNSPECIFIED PSYCHOSIS 11/22/2007  . MIGRAINE HEADACHE 11/22/2007  . HYPERTENSION 11/22/2007  . BACK PAIN 11/22/2007  . LATERAL EPICONDYLITIS OF ELBOW 11/22/2007    Past Surgical History:  Procedure Laterality Date  . ABDOMINAL HYSTERECTOMY    . BIOPSY N/A 08/13/2014   Procedure: BIOPSY;  Surgeon: Daneil Dolin, MD;  Location: [facility] ORS;  Service: Endoscopy;  Laterality: N/A;  . BREAST BIOPSY  10/18/2011   Procedure: BREAST BIOPSY;  Surgeon: Jamesetta So, MD;  Location: [facility] ORS;  Service: General;  Laterality: Left;  . COLON RESECTION N/A 06/23/2013   Procedure: HAND ASSISTED LAPAROSCOPIC PARTIAL COLECTOMY;  Surgeon: Jamesetta So, MD;  Location: [facility] ORS;  Service: General;  Laterality: N/A;  . COLONOSCOPY    . COLONOSCOPY N/A 05/29/2013   IRJ:JOACZYS mass most likely representing  colorectal cancer S/ P biospy.Multiple colonic and rectal polyps removed/treated as described above. Colonic diverticulosis  . COLONOSCOPY WITH PROPOFOL N/A 08/13/2014   RMR: Status post sigmoid colectomy. Multiple colonic polyps removed as described above. Redundant colon. Pan colonic diverticuloisi  . COLONOSCOPY WITH PROPOFOL N/A 05/22/2016   Procedure: COLONOSCOPY WITH PROPOFOL;  Surgeon: Daneil Dolin, MD;  Location: [facility] END[facility] SUITE;  Service: Endoscopy;  Laterality: N/A;  7:30 am  . ESOPHAGEAL DILATION N/A 08/13/2014    Procedure: Herman;  Surgeon: Daneil Dolin, MD;  Location: [facility] OR[facility];  Service: Endoscopy;  Laterality: N/A;  . ESOPHAGOGASTRODUODENOSCOPY N/A 12/26/2012   NIO:EVOJJKK reflux esophagitis. Gastric and duodenal bulbar erosions-status post gastric biopsynegative H.pylori  . ESOPHAGOGASTRODUODENOSCOPY (EGD) WITH PROPOFOL N/A 08/13/2014   RMR: Small benign cystic-appearing lesion in distal esophagus of doubtful clinical significance, otherwise normal esophagus, status post Maloney dilation. Small hiatal hernia, some retained gastric contents (query delayed gastric emptying.)  . partial hysterectomy    . POLYPECTOMY N/A 08/13/2014   Procedure: POLYPECTOMY;  Surgeon: Daneil Dolin, MD;  Location: [facility];  Service: Endoscopy;  Laterality: N/A;     OB History    Gravida  4   Para  3   Term  3   Preterm      AB  1   Living  3     SAB      TAB  1   Ectopic      Multiple      Live Births  3            Home Medications    Prior to Admission medications   Medication Sig Start Date End Date Taking? Authorizing Provider  FLUoxetine (PROZAC) 20 MG capsule Take 1 capsule (20 mg total) by mouth daily. 01/07/17   Nanci Pina, FNP  fluticasone (FLONASE) 50 MCG/ACT nasal spray Place 2 sprays into both nostrils daily.    [provider]  hydrOXYzine (ATARAX/VISTARIL) 25 MG tablet Take 1 tablet (25 mg total) by mouth 3 (three) times daily as needed for anxiety. Patient taking differently: Take 25 mg by mouth daily.  01/06/17   Nanci Pina, FNP  mirtazapine (REMERON) 15 MG tablet Take 15 mg by mouth at bedtime.    [provider]  pantoprazole (PROTONIX) 40 MG tablet Take 1 tablet (40 mg total) by mouth daily. 10/26/17   Dorie Rank, MD  risperiDONE (RISPERDAL) 2 MG tablet Take 1 tablet (2 mg total) by mouth at bedtime. Patient taking differently: Take 2 mg by mouth 2 (two) times daily.  01/06/17   Nanci Pina, FNP  topiramate  (TOPAMAX) 25 MG tablet Take 1 tablet (25 mg total) by mouth daily. 01/07/17   Nanci Pina, FNP  traMADol (ULTRAM) 50 MG tablet Take 1 tablet (50 mg total) by mouth every 6 (six) hours as needed. 10/26/17   Dorie Rank, MD  UNABLE TO FIND Vit B-1 daily    [provider]  VITAMIN A PO Take by mouth daily.    [provider]    Family History Family History  Problem Relation Age of Onset  . Colon cancer Father        diagnosed in his 50s  . HIV/AIDS Brother   . Anesthesia problems Neg Hx   . Hypotension Neg Hx   . Malignant hyperthermia Neg Hx   . Pseudochol deficiency Neg Hx     Social History Social History   Tobacco Use  .  Smoking status: Current Every Day Smoker    Packs/day: 0.50    Years: 38.00    Pack years: 19.00    Types: Cigarettes    Last attempt to quit: 06/19/2014    Years since quitting: 3.3  . Smokeless tobacco: Never Used  . Tobacco comment: vapor only  Substance Use Topics  . Alcohol use: No    Alcohol/week: 0.0 oz  . Drug use: No     Allergies   Patient has no known allergies.   Review of Systems Review of Systems  All other systems reviewed and are negative.    Physical Exam Updated Vital Signs BP (!) 147/94 (BP Location: Right Arm)   Pulse 87   Temp 98.4 F (36.9 C) (Oral)   Resp 20   Ht 1.676 m (5\' 6" )   Wt 91.6 kg (202 lb)   SpO2 98%   BMI 32.60 kg/m   Physical Exam  Constitutional: She appears well-developed and well-nourished. No distress.  HENT:  Head: Normocephalic and atraumatic.  Right Ear: External ear normal.  Left Ear: External ear normal.  Eyes: Conjunctivae are normal. Right eye exhibits no discharge. Left eye exhibits no discharge. No scleral icterus.  Neck: Neck supple. No tracheal deviation present.  Cardiovascular: Normal rate, regular rhythm and intact distal pulses.  Pulmonary/Chest: Effort normal and breath sounds normal. No stridor. No respiratory distress. She has no wheezes. She has no  rales.  Abdominal: Soft. Bowel sounds are normal. She exhibits no distension. There is no tenderness. There is no rebound and no guarding.  Musculoskeletal: She exhibits no edema or tenderness.  Neurological: She is alert. She has normal strength. No cranial nerve deficit (no facial droop, extraocular movements intact, no slurred speech) or sensory deficit. She exhibits normal muscle tone. She displays no seizure activity. Coordination normal.  Skin: Skin is warm and dry. No rash noted.  Psychiatric: She has a normal mood and affect.  Nursing note and vitals reviewed.    ED Treatments / Results  Labs (all labs ordered are listed, but only abnormal results are displayed) Labs Reviewed  BASIC METABOLIC PANEL - Abnormal; Notable for the following components:      Result Value   Potassium 3.2 (*)    Glucose, Bld 104 (*)    All other components within normal limits  CBC  D-DIMER, QUANTITATIVE (NOT AT [facility])  I-STAT TROPONIN, ED    EKG EKG Interpretation  Date/Time:  Friday Oct 26 2017 20:25:48 EDT Ventricular Rate:  87 PR Interval:    QRS Duration: 84 QT Interval:  401 QTC Calculation: 486 R Axis:   3 Text Interpretation:  Sinus rhythm Probable left atrial enlargement Left ventricular hypertrophy Borderline prolonged QT interval No significant change since last tracing Confirmed by Dorie Rank [phone]) on 10/26/2017 8:28:54 PM   Radiology Dg Chest 2 View  Result Date: 10/26/2017 CLINICAL DATA:  Chest pain EXAM: CHEST - 2 VIEW COMPARISON:  06/20/2016 FINDINGS: The heart size and mediastinal contours are within normal limits. Both lungs are clear. The visualized skeletal structures are unremarkable. IMPRESSION: No active cardiopulmonary disease. Electronically Signed   By: Ulyses Jarred M.D.   On: 10/26/2017 21:41    Procedures Procedures (including critical care time)  Medications Ordered in ED Medications  aspirin chewable tablet 324 mg (324 mg Oral Not Given 10/26/17 2059)    0.9 %  sodium chloride infusion ( Intravenous New Bag/Given 10/26/17 2115)  gi cocktail (Maalox,Lidocaine,Donnatal) (30 mLs Oral Given 10/26/17 2107)  Initial Impression / Assessment and Plan / ED Course  I have reviewed the triage vital signs and the nursing notes.  Pertinent labs & imaging results that were available during my care of the patient were reviewed by me and considered in my medical decision making (see chart for details).   Patient presents to the emergency room for evaluation of chest pain.  The symptoms have been ongoing for several weeks.  ED work-up is reassuring.  Patient has normal EKG and cardiac enzymes despite several weeks of symptoms.  D-dimer is negative arguing against pulmonary embolism.  Patient does mention some acid reflux type symptoms.  She improved with a GI cocktail.  She will continue Protonix and was given a prescription for Carafate.  Recommend she follow-up with her primary care doctor.  Warning signs and precautions discussed. Final Clinical Impressions(s) / ED Diagnoses   Final diagnoses:  Chest pain, unspecified type  Gastroesophageal reflux disease with esophagitis    ED Discharge Orders        Ordered    pantoprazole (PROTONIX) 40 MG tablet  Daily     10/26/17 2204    traMADol (ULTRAM) 50 MG tablet  Every 6 hours PRN     10/26/17 2204       Dorie Rank, MD 10/26/17 2206

## 2017-10-26 NOTE — Discharge Instructions (Addendum)
Continue your antacid medications, you can take over-the-counter pain medications are Ultram as needed, follow-up with your doctor in the next week or 2 if the symptoms have not completely resolved, return as needed for worsening symptoms

## 2017-11-13 DIAGNOSIS — K219 Gastro-esophageal reflux disease without esophagitis: Secondary | ICD-10-CM | POA: Diagnosis not present

## 2017-11-13 DIAGNOSIS — Z79899 Other long term (current) drug therapy: Secondary | ICD-10-CM | POA: Diagnosis not present

## 2017-11-13 DIAGNOSIS — I10 Essential (primary) hypertension: Secondary | ICD-10-CM | POA: Diagnosis not present
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
## 2017-12-05 ENCOUNTER — Encounter (HOSPITAL_COMMUNITY): Payer: Self-pay | Admitting: Psychiatry

## 2017-12-05 ENCOUNTER — Ambulatory Visit (INDEPENDENT_AMBULATORY_CARE_PROVIDER_SITE_OTHER): Payer: Medicare Other | Admitting: Psychiatry

## 2017-12-05 ENCOUNTER — Encounter (INDEPENDENT_AMBULATORY_CARE_PROVIDER_SITE_OTHER): Payer: Self-pay

## 2017-12-05 VITALS — BP 160/94 | HR 74 | Ht 66.0 in | Wt 195.0 lb

## 2017-12-05 DIAGNOSIS — F333 Major depressive disorder, recurrent, severe with psychotic symptoms: Secondary | ICD-10-CM | POA: Diagnosis not present

## 2017-12-05 MED ORDER — LURASIDONE HCL 40 MG PO TABS: 40.0000 mg | ORAL_TABLET | Freq: Every day | ORAL | 0 refills | Status: DC

## 2017-12-05 MED ORDER — FLUOXETINE HCL 40 MG PO CAPS: 40.0000 mg | ORAL_CAPSULE | Freq: Every day | ORAL | 0 refills | Status: DC

## 2017-12-05 MED ORDER — MIRTAZAPINE 15 MG PO TABS: 15.0000 mg | ORAL_TABLET | Freq: Every day | ORAL | 0 refills | Status: DC

## 2017-12-05 NOTE — Patient Instructions (Addendum)
1. Continue fluoxetine 40 mg daily 2. Continue mirtazapine 15 mg at night  3. Increase latuda 40 mg daily  4. Discontinue topiramate 5. Return to clinic in one month for 30 mins 6. Referral to therapy CONTACT INFORMATION  What to do if you need to get in touch with someone regarding a psychiatric issue:  1. EMERGENCY: For psychiatric emergencies (if you are suicidal or if there are any other safety issues) call 911 and/or go to your nearest Emergency Room immediately.   2. IF YOU NEED SOMEONE TO TALK TO RIGHT NOW: Given my clinical responsibilities, I may not be able to speak with you over the phone for a prolonged period of time.  a. You may always call The National Suicide Prevention Lifeline at 1-800-273-TALK [phone]).  b. Your county of residence will also have local crisis services. For [facility]: [place] at [phone]

## 2017-12-22 DIAGNOSIS — Z13228 Encounter for screening for other metabolic disorders: Secondary | ICD-10-CM | POA: Diagnosis not present

## 2017-12-22 DIAGNOSIS — Z13220 Encounter for screening for lipoid disorders: Secondary | ICD-10-CM | POA: Diagnosis not present

## 2017-12-22 DIAGNOSIS — I10 Essential (primary) hypertension: Secondary | ICD-10-CM | POA: Diagnosis not present

## 2017-12-22 DIAGNOSIS — Z131 Encounter for screening for diabetes mellitus: Secondary | ICD-10-CM | POA: Diagnosis not present

## 2017-12-22 DIAGNOSIS — Z008 Encounter for other general examination: Secondary | ICD-10-CM | POA: Diagnosis not present

## 2017-12-22 DIAGNOSIS — M810 Age-related osteoporosis without current pathological fracture: Secondary | ICD-10-CM | POA: Diagnosis not present

## 2017-12-27 ENCOUNTER — Emergency Department (HOSPITAL_COMMUNITY)
Admission: EM | Admit: 2017-12-27 | Discharge: 2017-12-27 | Disposition: A | Discharge status: Home / Self Care | Payer: Medicare Other | Attending: Emergency Medicine | Admitting: Emergency Medicine

## 2017-12-27 ENCOUNTER — Encounter (HOSPITAL_COMMUNITY): Payer: Self-pay | Admitting: *Deleted

## 2017-12-27 ENCOUNTER — Emergency Department (HOSPITAL_COMMUNITY): Payer: Medicare Other

## 2017-12-27 ENCOUNTER — Other Ambulatory Visit: Payer: Self-pay

## 2017-12-27 DIAGNOSIS — R1012 Left upper quadrant pain: Secondary | ICD-10-CM | POA: Diagnosis not present

## 2017-12-27 DIAGNOSIS — R1032 Left lower quadrant pain: Secondary | ICD-10-CM | POA: Diagnosis not present

## 2017-12-27 DIAGNOSIS — F17210 Nicotine dependence, cigarettes, uncomplicated: Secondary | ICD-10-CM | POA: Diagnosis not present

## 2017-12-27 DIAGNOSIS — N289 Disorder of kidney and ureter, unspecified: Secondary | ICD-10-CM | POA: Diagnosis not present

## 2017-12-27 DIAGNOSIS — R1909 Other intra-abdominal and pelvic swelling, mass and lump: Secondary | ICD-10-CM | POA: Diagnosis not present

## 2017-12-27 DIAGNOSIS — Z79899 Other long term (current) drug therapy: Secondary | ICD-10-CM | POA: Diagnosis not present

## 2017-12-27 DIAGNOSIS — Z85038 Personal history of other malignant neoplasm of large intestine: Secondary | ICD-10-CM | POA: Insufficient documentation

## 2017-12-27 DIAGNOSIS — R1010 Upper abdominal pain, unspecified: Secondary | ICD-10-CM | POA: Diagnosis not present

## 2017-12-27 DIAGNOSIS — K7689 Other specified diseases of liver: Secondary | ICD-10-CM | POA: Diagnosis not present

## 2017-12-27 DIAGNOSIS — R10816 Epigastric abdominal tenderness: Secondary | ICD-10-CM | POA: Diagnosis not present

## 2017-12-27 DIAGNOSIS — I10 Essential (primary) hypertension: Secondary | ICD-10-CM | POA: Diagnosis not present

## 2017-12-27 LAB — COMPREHENSIVE METABOLIC PANEL
ALK PHOS: 90 U/L (ref 38–126)
ALT: 27 U/L (ref 0–44)
AST: 20 U/L (ref 15–41)
Albumin: 4.1 g/dL (ref 3.5–5.0)
Anion gap: 6 (ref 5–15)
BILIRUBIN TOTAL: 0.3 mg/dL (ref 0.3–1.2)
BUN: 16 mg/dL (ref 6–20)
CALCIUM: 9.5 mg/dL (ref 8.9–10.3)
CO2: 28 mmol/L (ref 22–32)
Chloride: 110 mmol/L (ref 98–111)
Creatinine, Ser: 0.92 mg/dL (ref 0.44–1.00)
GFR calc non Af Amer: >60 mL/min (ref >60)
Glucose, Bld: 90 mg/dL (ref 70–99)
Potassium: 4.1 mmol/L (ref 3.5–5.1)
Sodium: 144 mmol/L (ref 135–145)
TOTAL PROTEIN: 7.7 g/dL (ref 6.5–8.1)

## 2017-12-27 LAB — URINALYSIS, ROUTINE W REFLEX MICROSCOPIC
BILIRUBIN URINE: NEGATIVE
Glucose, UA: NEGATIVE mg/dL
HGB URINE DIPSTICK: NEGATIVE
Ketones, ur: NEGATIVE mg/dL
NITRITE: NEGATIVE
PROTEIN: NEGATIVE mg/dL
Specific Gravity, Urine: 1.027 (ref 1.005–1.030)
pH: 6 (ref 5.0–8.0)

## 2017-12-27 LAB — CBC WITH DIFFERENTIAL/PLATELET
BASOS ABS: 0 10*3/uL (ref 0.0–0.1)
BASOS PCT: 0 %
Eosinophils Absolute: 0.2 10*3/uL (ref 0.0–0.7)
Eosinophils Relative: 3 %
HCT: 38 % (ref 36.0–46.0)
HEMOGLOBIN: 12.7 g/dL (ref 12.0–15.0)
Lymphocytes Relative: 42 %
Lymphs Abs: 2.5 10*3/uL (ref 0.7–4.0)
MCH: 30.1 pg (ref 26.0–34.0)
MCHC: 33.4 g/dL (ref 30.0–36.0)
MCV: 90 fL (ref 78.0–100.0)
MONO ABS: 0.4 10*3/uL (ref 0.1–1.0)
Monocytes Relative: 6 %
NEUTROS PCT: 49 %
Neutro Abs: 2.8 10*3/uL (ref 1.7–7.7)
Platelets: 228 10*3/uL (ref 150–400)
RBC: 4.22 MIL/uL (ref 3.87–5.11)
RDW: 15 % (ref 11.5–15.5)
WBC: 5.9 10*3/uL (ref 4.0–10.5)

## 2017-12-27 LAB — LIPASE, BLOOD: LIPASE: 27 U/L (ref 11–51)

## 2017-12-27 MED ORDER — TRAMADOL HCL 50 MG PO TABS: 50.0000 mg | ORAL_TABLET | Freq: Four times a day (QID) | ORAL | 0 refills | Status: DC | PRN

## 2017-12-27 MED ORDER — IOHEXOL 300 MG/ML  SOLN
100.0000 mL | Freq: Once | INTRAMUSCULAR | Status: AC | PRN
  Administered 2017-12-27: 100 mL via INTRAVENOUS

## 2017-12-27 MED ORDER — FENTANYL CITRATE (PF) 100 MCG/2ML IJ SOLN
100.0000 ug | INTRAMUSCULAR | Status: DC | PRN
  Administered 2017-12-27 (×3): 100 ug via INTRAVENOUS
  Filled 2017-12-27 (×3): qty 2

## 2017-12-27 MED ORDER — SODIUM CHLORIDE 0.9 % IV BOLUS
500.0000 mL | Freq: Once | INTRAVENOUS | Status: AC
  Administered 2017-12-27: 500 mL via INTRAVENOUS

## 2017-12-27 MED ORDER — ONDANSETRON HCL 4 MG/2ML IJ SOLN
4.0000 mg | Freq: Once | INTRAMUSCULAR | Status: AC
  Administered 2017-12-27: 4 mg via INTRAVENOUS
  Filled 2017-12-27: qty 2

## 2017-12-27 NOTE — Discharge Instructions (Signed)
Continue taking your stool softeners, and drink plenty of water to improve stooling.  It will also help to increase the fiber in your diet.  See the attached information about fiber supplementation.  Call your GI doctor to schedule appointment to be seen in 1 week for a checkup.  We are prescribing a short-term prescription of pain reliever, to improve your discomfort.

## 2017-12-27 NOTE — ED Provider Notes (Signed)
[facility] EMERGENCY DEPARTMENT Provider Note   CSN: [id number] Arrival date & time: 12/27/17  1515     History   Chief Complaint Chief Complaint  Patient presents with  . Abdominal Pain    HPI Ashley Patrick is a 62 y.o. female.  HPI   She presents for evaluation of left mid upper abdominal pain, present for 3 days, intermittently worse.  She saw her PCP today and was told that she might have a "tumor or hernia," so was sent here for evaluation.  She has history of colon cancer treated with surgical excision, not requiring follow-up chemotherapy or radiation therapy.  She was able to eat today but is not hungry.  She has had some small bowel movements but they are "like gel," brown in color.  No prior similar problems.  She denies nausea or vomiting.  She denies cough, chest pain, weakness or dizziness.  She came here by private vehicle from her doctor's office.  She has been taking her usual medications.  There are no other known modifying factors.  Past Medical History:  Diagnosis Date  . Back pain   . Bipolar 1 disorder ([place])   . Cancer [facility]) 2014   Colon Cancer  . Hypercholesteremia   . Hypertension   . Lateral epicondylitis  of elbow    right  . Migraine headache   . Nicotine addiction   . Obesity    History of  . OD (overdose of drug)    hospitalized for 11 days   . Psychosis [facility])     Patient Active Problem List   Diagnosis Date Noted  . Well woman exam with routine gynecological exam 01/29/2017  . 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 Tobacco abuse counseling 09/13/2016  . Abnormal CT scan, colon 05/02/2016  . GERD (gastroesophageal reflux disease) 05/02/2016  . Hiatal hernia   . History of colonic polyps   . Diverticulosis of colon without hemorrhage   . Constipation 07/22/2014  . History of colon cancer 07/20/2014  . Dysphagia 07/20/2014  . Lower abdominal pain 09/30/2013  . Bipolar affective  disorder, current episode depressed ([place]) 08/08/2013  . Anxiety, generalized 08/08/2013  . 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 Noncompliance with medications 08/07/2013  . Colon cancer ([place]) 06/23/2013  . Rectal bleeding 05/09/2013  . Gastritis 05/09/2013  . LLQ pain 11/28/2012  . Hemorrhagic cyst of ovary 11/28/2012  . SHOULDER PAIN, LEFT 05/18/2010  . ACUTE BRONCHITIS 09/03/2008  . UNSPECIFIED URINARY INCONTINENCE 09/03/2008  . ACUTE CYSTITIS 07/19/2008  . Abdominal pain, generalized 07/09/2008  . OBESITY 11/22/2007  . UNSPECIFIED PSYCHOSIS 11/22/2007  . MIGRAINE HEADACHE 11/22/2007  . HYPERTENSION 11/22/2007  . BACK PAIN 11/22/2007  . LATERAL EPICONDYLITIS OF ELBOW 11/22/2007    Past Surgical History:  Procedure Laterality Date  . ABDOMINAL HYSTERECTOMY    . BIOPSY N/A 08/13/2014   Procedure: BIOPSY;  Surgeon: Daneil Dolin, MD;  Location: [facility] ORS;  Service: Endoscopy;  Laterality: N/A;  . BREAST BIOPSY  10/18/2011   Procedure: BREAST BIOPSY;  Surgeon: Jamesetta So, MD;  Location: [facility] ORS;  Service: General;  Laterality: Left;  . COLON RESECTION N/A 06/23/2013   Procedure: HAND ASSISTED LAPAROSCOPIC PARTIAL COLECTOMY;  Surgeon: Jamesetta So, MD;  Location: [facility] ORS;  Service: General;  Laterality: N/A;  . COLONOSCOPY    . COLONOSCOPY N/A 05/29/2013   QMG:NOIBBCW mass most likely representing colorectal cancer S/ P biospy.Multiple  colonic and rectal polyps removed/treated as described above. Colonic diverticulosis  . COLONOSCOPY WITH PROPOFOL N/A 08/13/2014   RMR: Status post sigmoid colectomy. Multiple colonic polyps removed as described above. Redundant colon. Pan colonic diverticuloisi  . COLONOSCOPY WITH PROPOFOL N/A 05/22/2016   Procedure: COLONOSCOPY WITH PROPOFOL;  Surgeon: Daneil Dolin, MD;  Location: [facility] ENDO SUITE;  Service: Endoscopy;  Laterality: N/A;  7:30 am  . ESOPHAGEAL DILATION N/A 08/13/2014   Procedure: [place];  Surgeon: Daneil Dolin, MD;  Location: [facility] ORS;  Service: Endoscopy;  Laterality: N/A;  . ESOPHAGOGASTRODUODENOSCOPY N/A 12/26/2012   OVF:IEPPIRJ reflux esophagitis. Gastric and duodenal bulbar erosions-status post gastric biopsynegative H.pylori  . ESOPHAGOGASTRODUODENOSCOPY (EGD) WITH PROPOFOL N/A 08/13/2014   RMR: Small benign cystic-appearing lesion in distal esophagus of doubtful clinical significance, otherwise normal esophagus, status post Maloney dilation. Small hiatal hernia, some retained gastric contents (query delayed gastric emptying.)  . partial hysterectomy    . POLYPECTOMY N/A 08/13/2014   Procedure: POLYPECTOMY;  Surgeon: Daneil Dolin, MD;  Location: [facility] ORS;  Service: Endoscopy;  Laterality: N/A;     OB History    Gravida  4   Para  3   Term  3   Preterm      AB  1   Living  3     SAB      TAB  1   Ectopic      Multiple      Live Births  3            Home Medications    Prior to Admission medications   Medication Sig Start Date End Date Taking? Authorizing Provider  Ascorbic Acid (VITAMIN C) 250 MG CHEW Chew by mouth daily.   Yes [provider]  benazepril (LOTENSIN) 20 MG tablet  11/20/17  Yes [provider]  Cholecalciferol (VITAMIN D) 2000 units CAPS Take by mouth daily.   Yes [provider]  FLUoxetine (PROZAC) 40 MG capsule Take 1 capsule (40 mg total) by mouth daily. 12/05/17  Yes [name], MD  hydrOXYzine (ATARAX/VISTARIL) 25 MG tablet Take 1 tablet (25 mg total) by mouth 3 (three) times daily as needed for anxiety. Patient taking differently: Take 25 mg by mouth daily.  01/06/17  Yes Starkes, Gayland Curry, FNP  lurasidone (LATUDA) 40 MG TABS tablet Take 1 tablet (40 mg total) by mouth daily with breakfast. 12/05/17  Yes Hisada, Elie Goody, MD  mirtazapine (REMERON) 15 MG tablet Take 15 mg by mouth at bedtime.   Yes [provider]  mirtazapine (REMERON) 15 MG tablet Take 1 tablet (15 mg  total) by mouth at bedtime. 12/05/17  Yes [name], MD  omeprazole (PRILOSEC) 20 MG capsule  10/16/17  Yes [provider]  pantoprazole (PROTONIX) 40 MG tablet Take 1 tablet (40 mg total) by mouth daily. 10/26/17  Yes Dorie Rank, MD  UNABLE TO FIND Vit B-1 daily   Yes [provider]  VITAMIN A PO Take by mouth daily.   Yes [provider]  vitamin E 400 UNIT capsule Take 400 Units by mouth daily.   Yes [provider]  IBU 800 MG tablet Take 1 tablet by mouth every 6 (six) hours as needed. 12/14/17   [provider]  traMADol (ULTRAM) 50 MG tablet Take 1 tablet (50 mg total) by mouth every 6 (six) hours as needed. 12/27/17   Daleen Bo, MD    Family History Family History  Problem  Relation Age of Onset  . Drug abuse Mother   . Colon cancer Father        diagnosed in his 61s  . Drug abuse Father   . HIV/AIDS Brother   . Anesthesia problems Neg Hx   . Hypotension Neg Hx   . Malignant hyperthermia Neg Hx   . Pseudochol deficiency Neg Hx     Social History Social History   Tobacco Use  . Smoking status: Current Every Day Smoker    Packs/day: 0.50    Years: 38.00    Pack years: 19.00    Types: Cigarettes    Last attempt to quit: 06/19/2014    Years since quitting: 3.5  . Smokeless tobacco: Never Used  . Tobacco comment: vapor only  Substance Use Topics  . Alcohol use: No    Alcohol/week: 0.0 oz  . Drug use: No     Allergies   Patient has no known allergies.   Review of Systems Review of Systems  All other systems reviewed and are negative.    Physical Exam Updated Vital Signs BP (!) 155/92 Comment: Simultaneous filing. User may not have seen previous data.  Pulse 61   Temp 97.6 F (36.4 C) (Oral)   Resp 18   Ht 5\' 6"  (1.676 m)   Wt 90.7 kg (200 lb)   SpO2 93%   BMI 32.28 kg/m   Physical Exam  Constitutional: She is oriented to person, place, and time. She appears well-developed. She appears ill. She  appears distressed (She is uncomfortable).  She appears older than stated age.  She is mildly overweight.  HENT:  Head: Normocephalic and atraumatic.  Right Ear: External ear normal.  Left Ear: External ear normal.  Eyes: Pupils are equal, round, and reactive to light. Conjunctivae and EOM are normal.  Neck: Normal range of motion and phonation normal. Neck supple.  Cardiovascular: Normal rate, regular rhythm and normal heart sounds.  Pulmonary/Chest: Effort normal and breath sounds normal. No stridor. She exhibits no bony tenderness.  Abdominal: Soft. She exhibits distension. She exhibits no mass. There is tenderness (Diffuse, moderate). There is guarding. There is no rebound. No hernia.  Musculoskeletal: Normal range of motion. She exhibits no edema or deformity.  Neurological: She is alert and oriented to person, place, and time. No cranial nerve deficit or sensory deficit. She exhibits normal muscle tone. Coordination normal.  Skin: Skin is warm, dry and intact.  Psychiatric: She has a normal mood and affect. Her behavior is normal. Judgment and thought content normal.  Nursing note and vitals reviewed.    ED Treatments / Results  Labs (all labs ordered are listed, but only abnormal results are displayed) Labs Reviewed  URINALYSIS, ROUTINE W REFLEX MICROSCOPIC - Abnormal; Notable for the following components:      Result Value   Color, Urine AMBER (*)    APPearance HAZY (*)    Leukocytes, UA TRACE (*)    Bacteria, UA MANY (*)    All other components within normal limits  COMPREHENSIVE METABOLIC PANEL  CBC WITH DIFFERENTIAL/PLATELET  LIPASE, BLOOD    EKG None  Radiology Ct Abdomen Pelvis W Contrast  Result Date: 12/27/2017 CLINICAL DATA:  Upper abdominal pain that started Tuesday. EXAM: CT ABDOMEN AND PELVIS WITH CONTRAST TECHNIQUE: Multidetector CT imaging of the abdomen and pelvis was performed using the standard protocol following bolus administration of intravenous  contrast. CONTRAST:  149mL OMNIPAQUE IOHEXOL 300 MG/ML  SOLN COMPARISON:  03/30/2016 FINDINGS: Lower chest: Unremarkable Hepatobiliary:  Multiple hepatic cysts again identified, measuring up to 2.6 cm in the right liver, stable. There is no evidence for gallstones, gallbladder wall thickening, or pericholecystic fluid. No intrahepatic or extrahepatic biliary dilation. Pancreas: No focal mass lesion. No dilatation of the main duct. No intraparenchymal cyst. No peripancreatic edema. Spleen: No splenomegaly. No focal mass lesion. Adrenals/Urinary Tract: No adrenal nodule or mass. The stable bilateral low-density renal lesions including 11 mm right upper pole lesion also stable back to 09/24/2013. Kidneys otherwise unremarkable. No evidence for hydroureter. The urinary bladder appears normal for the degree of distention. Stomach/Bowel: Stomach is nondistended. No gastric wall thickening. No evidence of outlet obstruction. Duodenum is normally positioned as is the ligament of Treitz. No small bowel wall thickening. No small bowel dilatation. The terminal ileum is normal. The appendix is normal. Prominent colonic stool volume with anastomosis identified in the sigmoid segment. Vascular/Lymphatic: There is abdominal aortic atherosclerosis without aneurysm. There is no gastrohepatic or hepatoduodenal ligament lymphadenopathy. No intraperitoneal or retroperitoneal lymphadenopathy. No pelvic sidewall lymphadenopathy. Reproductive: Uterus surgically absent.  There is no adnexal mass. Other: No intraperitoneal free fluid. Musculoskeletal: No worrisome lytic or sclerotic osseous abnormality. Tiny umbilical hernia evident. No other ventral hernia. IMPRESSION: 1. Stable exam.  No new or acute interval findings. 2. Tiny umbilical hernia contains only fat. No other abdominal wall hernia evident. 3. Stable hepatic cysts. 4. Stable 11 mm right upper pole renal lesion since 09/24/2013, consistent with benign etiology. Electronically  Signed   By: Misty Stanley M.D.   On: 12/27/2017 19:37    Procedures Procedures (including critical care time)  Medications Ordered in ED Medications  fentaNYL (SUBLIMAZE) injection 100 mcg (100 mcg Intravenous Given 12/27/17 1959)  sodium chloride 0.9 % bolus 500 mL (0 mLs Intravenous Stopped 12/27/17 1735)  ondansetron (ZOFRAN) injection 4 mg (4 mg Intravenous Given 12/27/17 1712)  iohexol (OMNIPAQUE) 300 MG/ML solution 100 mL (100 mLs Intravenous Contrast Given 12/27/17 1850)     Initial Impression / Assessment and Plan / ED Course  I have reviewed the triage vital signs and the nursing notes.  Pertinent labs & imaging results that were available during my care of the patient were reviewed by me and considered in my medical decision making (see chart for details).  Clinical Course as of Dec 27 2120  Thu Dec 27, 2017  2107 Normal except many bacteria on urinalysis with 6-10 white blood cells.  Urinalysis, Routine w reflex microscopic(!) [EW]  2108 Normal  CBC with Differential [EW]  2108 Normal  Lipase, blood [EW]  2108 normal  Comprehensive metabolic panel [EW]  7564 No acute changes, images reviewed by me  CT Abdomen Pelvis W Contrast [EW]    Clinical Course User Index [EW] Daleen Bo, MD     Patient Vitals for the past 24 hrs:  BP Temp Temp src Pulse Resp SpO2 Height Weight  12/27/17 1830 (!) 155/92 - - 61 - 93 % - -  12/27/17 1815 130/74 97.6 F (36.4 C) Oral 88 18 99 % - -  12/27/17 1800 (!) 164/93 - - 75 - 99 % - -  12/27/17 1735 (!) 146/82 - - 72 - 99 % - -  12/27/17 1700 133/81 - - 75 - 96 % - -  12/27/17 1630 (!) 143/88 - - 78 - 98 % - -  12/27/17 1537 (!) 155/96 98.1 F (36.7 C) Oral 91 20 98 % - -  12/27/17 1536 - - - - - - 5\' 6"  (1.676 m)  90.7 kg (200 lb)    9:11 PM Reevaluation with update and discussion. After initial assessment and treatment, an updated evaluation reveals she is more comfortable at this time.  She is concerned about lack of clear  diagnosis for etiology of her discomfort.  She states that she is currently using a stool softener but cannot remember the name of it.  She has tried MiraLAX in the past but it never helped her.  She would prefer to follow-up with her GI doctor for further evaluation next week.  Findings discussed and questions answered. Daleen Bo   Medical Decision Making: Nonspecific abdominal pain with reassuring ED evaluation.  Suspect intestinal cramping as etiology of pain associated with normal ability of stooling.  Doubt serious bacterial infection, metabolic instability or impending vascular collapse.  CRITICAL CARE-no Performed by: Daleen Bo   Nursing Notes Reviewed/ Care Coordinated Applicable Imaging Reviewed Interpretation of Laboratory Data incorporated into ED treatment  The patient appears reasonably screened and/or stabilized for discharge and I doubt any other medical condition or other [facility] requiring further screening, evaluation, or treatment in the ED at this time prior to discharge.  Plan: Home Medications-continue usual medications; Home Treatments-increase fiber and fluid in diet; return here if the recommended treatment, does not improve the symptoms; Recommended follow up-PCP, as needed.  GI follow-up 1 week and as needed.     Final Clinical Impressions(s) / ED Diagnoses   Final diagnoses:  Left lower quadrant pain    ED Discharge Orders        Ordered    traMADol (ULTRAM) 50 MG tablet  Every 6 hours PRN     12/27/17 2120       Daleen Bo, MD 12/27/17 2123

## 2017-12-27 NOTE — ED Triage Notes (Signed)
Pt c/o upper abdominal pain that started Tuesday. Pt was seen by PCP today and told she had a hernia. Pt was sent to ED for further evaluation. Denies n/v/d.

## 2018-01-01 DIAGNOSIS — Z0001 Encounter for general adult medical examination with abnormal findings: Secondary | ICD-10-CM | POA: Diagnosis not present

## 2018-01-01 DIAGNOSIS — I10 Essential (primary) hypertension: Secondary | ICD-10-CM | POA: Diagnosis not present

## 2018-01-01 DIAGNOSIS — G43909 Migraine, unspecified, not intractable, without status migrainosus: Secondary | ICD-10-CM | POA: Diagnosis not present

## 2018-01-01 DIAGNOSIS — R1084 Generalized abdominal pain: Secondary | ICD-10-CM | POA: Diagnosis not present
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
## 2018-01-07 ENCOUNTER — Encounter (HOSPITAL_COMMUNITY): Payer: Self-pay | Admitting: Psychiatry

## 2018-01-07 ENCOUNTER — Ambulatory Visit (INDEPENDENT_AMBULATORY_CARE_PROVIDER_SITE_OTHER): Payer: Medicare Other | Admitting: Psychiatry

## 2018-01-07 VITALS — BP 132/92 | HR 81 | Ht 66.0 in | Wt 205.0 lb

## 2018-01-07 DIAGNOSIS — F333 Major depressive disorder, recurrent, severe with psychotic symptoms: Secondary | ICD-10-CM

## 2018-01-07 DIAGNOSIS — G4700 Insomnia, unspecified: Secondary | ICD-10-CM

## 2018-01-07 DIAGNOSIS — Z813 Family history of other psychoactive substance abuse and dependence: Secondary | ICD-10-CM

## 2018-01-07 DIAGNOSIS — F17210 Nicotine dependence, cigarettes, uncomplicated: Secondary | ICD-10-CM | POA: Diagnosis not present

## 2018-01-07 DIAGNOSIS — R450 Nervousness: Secondary | ICD-10-CM

## 2018-01-07 LAB — TSH: TSH: 2.87 mIU/L (ref 0.40–4.50)

## 2018-01-07 MED ORDER — MIRTAZAPINE 15 MG PO TABS: 15.0000 mg | ORAL_TABLET | Freq: Every day | ORAL | 0 refills | Status: DC

## 2018-01-07 MED ORDER — FLUOXETINE HCL 40 MG PO CAPS: 40.0000 mg | ORAL_CAPSULE | Freq: Every day | ORAL | 0 refills | Status: DC

## 2018-01-07 MED ORDER — LURASIDONE HCL 60 MG PO TABS: 60.0000 mg | ORAL_TABLET | Freq: Every day | ORAL | 0 refills | Status: DC

## 2018-01-07 NOTE — Patient Instructions (Addendum)
1. Continue fluoxetine 40 mg daily 2. Continue mirtazapine 15 mg at night  3. Increase latuda 60 mg daily  4. Return to clinic in one month for 30 mins 5. CONTACT INFORMATION  What to do if you need to get in touch with someone regarding a psychiatric issue:  1. EMERGENCY: For psychiatric emergencies (if you are suicidal or if there are any other safety issues) call 911 and/or go to your nearest Emergency Room immediately.   2. IF YOU NEED SOMEONE TO TALK TO RIGHT NOW: Given my clinical responsibilities, I may not be able to speak with you over the phone for a prolonged period of time.  a. You may always call The National Suicide Prevention Lifeline at 1-800-273-TALK 340-635-6085).  b. Your county of residence will also have local crisis services. For [facility]: [place] at [phone] ([place])

## 2018-02-06 ENCOUNTER — Ambulatory Visit (INDEPENDENT_AMBULATORY_CARE_PROVIDER_SITE_OTHER): Payer: Medicare Other | Admitting: Licensed Clinical Social Worker

## 2018-02-06 DIAGNOSIS — F333 Major depressive disorder, recurrent, severe with psychotic symptoms: Secondary | ICD-10-CM | POA: Diagnosis not present

## 2018-02-07 ENCOUNTER — Encounter (HOSPITAL_COMMUNITY): Payer: Self-pay | Admitting: Licensed Clinical Social Worker
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
## 2018-02-12 ENCOUNTER — Encounter (HOSPITAL_COMMUNITY): Payer: Self-pay | Admitting: Psychiatry

## 2018-02-12 ENCOUNTER — Ambulatory Visit (INDEPENDENT_AMBULATORY_CARE_PROVIDER_SITE_OTHER): Payer: Medicare Other | Admitting: Psychiatry

## 2018-02-12 VITALS — BP 166/100 | HR 84 | Ht 66.0 in | Wt 212.0 lb

## 2018-02-12 DIAGNOSIS — I10 Essential (primary) hypertension: Secondary | ICD-10-CM | POA: Diagnosis not present

## 2018-02-12 DIAGNOSIS — F4310 Post-traumatic stress disorder, unspecified: Secondary | ICD-10-CM | POA: Diagnosis not present

## 2018-02-12 DIAGNOSIS — F331 Major depressive disorder, recurrent, moderate: Secondary | ICD-10-CM

## 2018-02-12 DIAGNOSIS — N3091 Cystitis, unspecified with hematuria: Secondary | ICD-10-CM | POA: Diagnosis not present

## 2018-02-12 DIAGNOSIS — N3001 Acute cystitis with hematuria: Secondary | ICD-10-CM | POA: Diagnosis not present

## 2018-02-12 MED ORDER — LURASIDONE HCL 60 MG PO TABS: 60.0000 mg | ORAL_TABLET | Freq: Every day | ORAL | 0 refills | Status: DC

## 2018-02-12 MED ORDER — FLUOXETINE HCL 40 MG PO CAPS: 40.0000 mg | ORAL_CAPSULE | Freq: Every day | ORAL | 0 refills | Status: DC

## 2018-02-12 NOTE — Patient Instructions (Addendum)
1. Continue fluoxetine 40 mg daily 2. Discontinue mirtazapine 3. Continue Latuda 60 mg daily  4. Return to clinic in three months for 30 mins  CONTACT INFORMATION  What to do if you need to get in touch with someone regarding a psychiatric issue:  1. EMERGENCY: For psychiatric emergencies (if you are suicidal or if there are any other safety issues) call 911 and/or go to your nearest Emergency Room immediately.   2. IF YOU NEED SOMEONE TO TALK TO RIGHT NOW: Given my clinical responsibilities, I may not be able to speak with you over the phone for a prolonged period of time.  a. You may always call The National Suicide Prevention Lifeline at 1-800-273-TALK (437)[phone]).  b. Your county of residence will also have local crisis services. For [facility]: [place] at [phone] ([place])

## 2018-03-05 ENCOUNTER — Encounter (HOSPITAL_COMMUNITY): Payer: Self-pay | Admitting: Physical Therapy

## 2018-03-27 ENCOUNTER — Telehealth: Payer: Self-pay | Admitting: Nurse Practitioner

## 2018-03-27 ENCOUNTER — Encounter

## 2018-03-27 ENCOUNTER — Encounter: Payer: Self-pay | Admitting: Internal Medicine

## 2018-03-27 ENCOUNTER — Ambulatory Visit: Payer: Medicare Other | Admitting: Nurse Practitioner

## 2018-03-27 NOTE — Telephone Encounter (Signed)
PATIENT WAS A NO SHOW AND LETTER SENT  °

## 2018-03-27 NOTE — Telephone Encounter (Signed)
Noted  

## 2018-03-27 NOTE — Progress Notes (Deleted)
Referring Provider: Jani Gravel, MD Primary Care Physician:  Jani Gravel, MD Primary GI:  Dr. Gala Romney  No chief complaint on file.   HPI:   Ashley Patrick is a 62 y.o. female who presents for abdominal swelling.  Patient was last seen in our office 09/13/2016 for history of colon polyps, constipation, history of colon cancer.  Also noted history of GERD.  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  She had a partial colectomy in January 2015 and was staged at T2, N0, M0.  No additional treatment per oncology and one year repeat colonoscopy recommended.  EGD in February 2016 with minimal retained gastric contents that exam was thorough. Small benign cystic-appearing lesion in the distal esophagus, small hiatal hernia.  Colonoscopy December 2017 with multiple tubular adenomas removedEvidence of prior surgical anastomosis in the sigmoid colon. Appeared patent,? Somewhat narrowed just proximal anastomosis. Follow-up barium enema to further evaluate anastomosis showed no anastomotic stricture. Next colonoscopy planned December 2020.  Her last visit she stated she did not like Linzess.  She preferred her current regimen of mag citrate when really bloated and backed up maybe twice per month.  Stool softener every day (typically 2 daily).  Occasional pain medicine.  Minimal to no GERD even off PPI.  Avoiding certain foods due to constipation.  No other GI complaints.  Recommended tobacco cessation, repeat colonoscopy in December 2020, follow-up in 6 months or sooner if needed.  She was a no-show for her follow-up visit 03/21/2017.  Today she states   Past Medical History:  Diagnosis Date  . Back pain   . Bipolar 1 disorder ([place])   . Cancer [facility]) 2014   Colon Cancer  . Hypercholesteremia   . Hypertension   . Lateral epicondylitis  of elbow    right  . Migraine headache   . Nicotine addiction   . Obesity    History of  .  OD (overdose of drug)    hospitalized for 11 days   . Psychosis [facility])     Past Surgical History:  Procedure Laterality Date  . ABDOMINAL HYSTERECTOMY    . BIOPSY N/A 08/13/2014   Procedure: BIOPSY;  Surgeon: Daneil Dolin, MD;  Location: [facility] ORS;  Service: Endoscopy;  Laterality: N/A;  . BREAST BIOPSY  10/18/2011   Procedure: BREAST BIOPSY;  Surgeon: Jamesetta So, MD;  Location: [facility] ORS;  Service: General;  Laterality: Left;  . COLON RESECTION N/A 06/23/2013   Procedure: HAND ASSISTED LAPAROSCOPIC PARTIAL COLECTOMY;  Surgeon: Jamesetta So, MD;  Location: [facility] ORS;  Service: General;  Laterality: N/A;  . COLONOSCOPY    . COLONOSCOPY N/A 05/29/2013   IWP:[id number] mass most likely representing colorectal cancer S/ P biospy.Multiple colonic and rectal polyps removed/treated as described above. Colonic diverticulosis  . COLONOSCOPY WITH PROPOFOL N/A 08/13/2014   RMR: Status post sigmoid colectomy. Multiple colonic polyps removed as described above. Redundant colon. Pan colonic diverticuloisi  . COLONOSCOPY WITH PROPOFOL N/A 05/22/2016   Procedure: COLONOSCOPY WITH PROPOFOL;  Surgeon: Daneil Dolin, MD;  Location: [facility] ENDO SUITE;  Service: Endoscopy;  Laterality: N/A;  7:30 am  . ESOPHAGEAL DILATION N/A 08/13/2014   Procedure: [place];  Surgeon: Daneil Dolin, MD;  Location: [facility] ORS;  Service: Endoscopy;  Laterality: N/A;  . ESOPHAGOGASTRODUODENOSCOPY N/A 12/26/2012   ASN:[id number] reflux esophagitis. Gastric and duodenal bulbar erosions-status post gastric biopsynegative H.pylori  . ESOPHAGOGASTRODUODENOSCOPY (  EGD) WITH PROPOFOL N/A 08/13/2014   RMR: Small benign cystic-appearing lesion in distal esophagus of doubtful clinical significance, otherwise normal esophagus, status post Maloney dilation. Small hiatal hernia, some retained gastric contents (query delayed gastric emptying.)  . partial hysterectomy    . POLYPECTOMY N/A 08/13/2014   Procedure: POLYPECTOMY;  Surgeon:  Daneil Dolin, MD;  Location: [facility] ORS;  Service: Endoscopy;  Laterality: N/A;    Current Outpatient Medications  Medication Sig Dispense Refill  . Ascorbic Acid (VITAMIN C) 250 MG CHEW Chew by mouth daily.    . benazepril (LOTENSIN) 20 MG tablet     . Cholecalciferol (VITAMIN D) 2000 units CAPS Take by mouth daily.    Marland Kitchen FLUoxetine (PROZAC) 40 MG capsule Take 1 capsule (40 mg total) by mouth daily. 90 capsule 0  . hydrOXYzine (ATARAX/VISTARIL) 25 MG tablet Take 1 tablet (25 mg total) by mouth 3 (three) times daily as needed for anxiety. (Patient taking differently: Take 25 mg by mouth daily. ) 30 tablet 0  . IBU 800 MG tablet Take 1 tablet by mouth every 6 (six) hours as needed.    . Lurasidone HCl 60 MG TABS Take 1 tablet (60 mg total) by mouth daily with breakfast. 90 tablet 0  . omeprazole (PRILOSEC) 20 MG capsule     . pantoprazole (PROTONIX) 40 MG tablet Take 1 tablet (40 mg total) by mouth daily. 30 tablet 0  . traMADol (ULTRAM) 50 MG tablet Take 1 tablet (50 mg total) by mouth every 6 (six) hours as needed. 15 tablet 0  . UNABLE TO FIND Vit B-1 daily    . VITAMIN A PO Take by mouth daily.    . vitamin E 400 UNIT capsule Take 400 Units by mouth daily.     No current facility-administered medications for this visit.     Allergies as of 03/27/2018  . (No Known Allergies)    Family History  Problem Relation Age of Onset  . Drug abuse Mother   . Colon cancer Father        diagnosed in his 48s  . Drug abuse Father   . HIV/AIDS Brother   . Anesthesia problems Neg Hx   . Hypotension Neg Hx   . Malignant hyperthermia Neg Hx   . Pseudochol deficiency Neg Hx     Social History   Socioeconomic History  . Marital status: Legally Separated    Spouse name: Not on file  . Number of children: 3  . Years of education: Not on file  . Highest education level: Not on file  Occupational History  . Not on file  Social Needs  . Financial resource strain: Not on file  . Food  insecurity:    Worry: Not on file    Inability: Not on file  . Transportation needs:    Medical: Not on file    Non-medical: Not on file  Tobacco Use  . Smoking status: Current Every Day Smoker    Packs/day: 0.50    Years: 38.00    Pack years: 19.00    Types: Cigarettes    Last attempt to quit: 06/19/2014    Years since quitting: 3.7  . Smokeless tobacco: Never Used  . Tobacco comment: vapor only  Substance and Sexual Activity  . Alcohol use: No    Alcohol/week: 0.0 standard drinks  . Drug use: No  . Sexual activity: Yes    Birth control/protection: Surgical    Comment: hyst  Lifestyle  . Physical activity:  Days per week: Not on file    Minutes per session: Not on file  . Stress: Not on file  Relationships  . Social connections:    Talks on phone: Not on file    Gets together: Not on file    Attends religious service: Not on file    Active member of club or organization: Not on file    Attends meetings of clubs or organizations: Not on file    Relationship status: Not on file  Other Topics Concern  . Not on file  Social History Narrative  . Not on file    Review of Systems: General: Negative for anorexia, weight loss, fever, chills, fatigue, weakness. Eyes: Negative for vision changes.  ENT: Negative for hoarseness, difficulty swallowing , nasal congestion. CV: Negative for chest pain, angina, palpitations, dyspnea on exertion, peripheral edema.  Respiratory: Negative for dyspnea at rest, dyspnea on exertion, cough, sputum, wheezing.  GI: See history of present illness. GU:  Negative for dysuria, hematuria, urinary incontinence, urinary frequency, nocturnal urination.  MS: Negative for joint pain, low back pain.  Derm: Negative for rash or itching.  Neuro: Negative for weakness, abnormal sensation, seizure, frequent headaches, memory loss, confusion.  Psych: Negative for anxiety, depression, suicidal ideation, hallucinations.  Endo: Negative for unusual weight  change.  Heme: Negative for bruising or bleeding. Allergy: Negative for rash or hives.   Physical Exam: There were no vitals taken for this visit. General:   Alert and oriented. Pleasant and cooperative. Well-nourished and well-developed.  Head:  Normocephalic and atraumatic. Eyes:  Without icterus, sclera clear and conjunctiva pink.  Ears:  Normal auditory acuity. Mouth:  No deformity or lesions, oral mucosa pink.  Throat/Neck:  Supple, without mass or thyromegaly. Cardiovascular:  S1, S2 present without murmurs appreciated. Normal pulses noted. Extremities without clubbing or edema. Respiratory:  Clear to auscultation bilaterally. No wheezes, rales, or rhonchi. No distress.  Gastrointestinal:  +BS, soft, non-tender and non-distended. No HSM noted. No guarding or rebound. No masses appreciated.  Rectal:  Deferred  Musculoskalatal:  Symmetrical without gross deformities. Normal posture. Skin:  Intact without significant lesions or rashes. Neurologic:  Alert and oriented x4;  grossly normal neurologically. Psych:  Alert and cooperative. Normal mood and affect. Heme/Lymph/Immune: No significant cervical adenopathy. No excessive bruising noted.    03/27/2018 7:46 AM   Disclaimer: This note was dictated with voice recognition software. Similar sounding words can inadvertently be transcribed and may not be corrected upon review.

## 2018-04-02 ENCOUNTER — Ambulatory Visit (INDEPENDENT_AMBULATORY_CARE_PROVIDER_SITE_OTHER): Payer: Medicare Other | Admitting: Licensed Clinical Social Worker

## 2018-04-02 DIAGNOSIS — F333 Major depressive disorder, recurrent, severe with psychotic symptoms: Secondary | ICD-10-CM | POA: Diagnosis not present

## 2018-04-03 ENCOUNTER — Encounter (HOSPITAL_COMMUNITY): Payer: Self-pay | Admitting: Licensed Clinical Social Worker
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
## 2018-04-15 ENCOUNTER — Telehealth (HOSPITAL_COMMUNITY): Payer: Self-pay | Admitting: *Deleted

## 2018-04-15 NOTE — Telephone Encounter (Signed)
Dr Modesta Messing Patient called requesting a refill on her "sleep medicine" Tramadol. Patient stated she has been having panic attacks lately & I encouraged her to get a appointment sooner than the 05/15/18 she has now .Because a evaluation would be needed for the panic attacks

## 2018-04-15 NOTE — Telephone Encounter (Signed)
Agree with you- she will need evaluation. I will not prescribe Tramadol- she needs to ask her PCP.

## 2018-04-19 DIAGNOSIS — I10 Essential (primary) hypertension: Secondary | ICD-10-CM | POA: Diagnosis not present

## 2018-04-19 DIAGNOSIS — E559 Vitamin D deficiency, unspecified: Secondary | ICD-10-CM | POA: Diagnosis not present

## 2018-04-19 DIAGNOSIS — Z79899 Other long term (current) drug therapy: Secondary | ICD-10-CM | POA: Diagnosis not present

## 2018-04-19 DIAGNOSIS — E785 Hyperlipidemia, unspecified: Secondary | ICD-10-CM | POA: Diagnosis not present

## 2018-04-19 DIAGNOSIS — R350 Frequency of micturition: Secondary | ICD-10-CM | POA: Diagnosis not present
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
## 2018-04-26 ENCOUNTER — Ambulatory Visit (INDEPENDENT_AMBULATORY_CARE_PROVIDER_SITE_OTHER): Payer: Medicare Other | Admitting: Psychiatry

## 2018-04-26 ENCOUNTER — Encounter (HOSPITAL_COMMUNITY): Payer: Self-pay | Admitting: Psychiatry

## 2018-04-26 VITALS — BP 140/90 | HR 90 | Ht 66.0 in | Wt 211.0 lb

## 2018-04-26 DIAGNOSIS — F333 Major depressive disorder, recurrent, severe with psychotic symptoms: Secondary | ICD-10-CM | POA: Diagnosis not present

## 2018-04-26 MED ORDER — LURASIDONE HCL 60 MG PO TABS: 60.0000 mg | ORAL_TABLET | Freq: Every day | ORAL | 0 refills | Status: DC

## 2018-04-26 MED ORDER — FLUOXETINE HCL 40 MG PO CAPS: 40.0000 mg | ORAL_CAPSULE | Freq: Every day | ORAL | 0 refills | Status: DC

## 2018-04-26 NOTE — Patient Instructions (Addendum)
1. Continue fluoxetine 40 mg daily 2. ContinueLatuda 60 mg daily 3.Return to clinic in one month for 30 mins 4. CONTACT INFORMATION  What to do if you need to get in touch with someone regarding a psychiatric issue:  1. EMERGENCY: For psychiatric emergencies (if you are suicidal or if there are any other safety issues) call 911 and/or go to your nearest Emergency Room immediately.   2. IF YOU NEED SOMEONE TO TALK TO RIGHT NOW: Given my clinical responsibilities, I may not be able to speak with you over the phone for a prolonged period of time.  a. You may always call The National Suicide Prevention Lifeline at 1-800-273-TALK (775) 828-7511).  b. Your county of residence will also have local crisis services. For [facility]: [place] at [phone] ([place])

## 2018-04-30 ENCOUNTER — Ambulatory Visit (HOSPITAL_COMMUNITY): Payer: Self-pay | Admitting: Licensed Clinical Social Worker

## 2018-04-30 DIAGNOSIS — I10 Essential (primary) hypertension: Secondary | ICD-10-CM | POA: Diagnosis not present

## 2018-04-30 DIAGNOSIS — E559 Vitamin D deficiency, unspecified: Secondary | ICD-10-CM | POA: Diagnosis not present

## 2018-04-30 DIAGNOSIS — Z23 Encounter for immunization: Secondary | ICD-10-CM | POA: Diagnosis not present

## 2018-04-30 DIAGNOSIS — E785 Hyperlipidemia, unspecified: Secondary | ICD-10-CM | POA: Diagnosis not present

## 2018-04-30 DIAGNOSIS — G4700 Insomnia, unspecified: Secondary | ICD-10-CM | POA: Diagnosis not present

## 2018-04-30 DIAGNOSIS — N390 Urinary tract infection, site not specified: Secondary | ICD-10-CM | POA: Diagnosis not present

## 2018-05-13 NOTE — Progress Notes (Addendum)
[place] MD/PA/NP OP Progress Note  05/15/2018 10:01 AM Ashley Patrick  MRN:  [id number]  Chief Complaint:  Chief Complaint    Depression; Follow-up     HPI: Patient presents for follow-up appointment for depression.  She states that she feels better and had a good month since the last appointment.  She enjoys doing Bible study.  She occasionally meets with a lady and goes to church on some Sunday.  She does not feel bothered as much as she used to, although she may occasionally feels depressed.  She tries to "shake it off" and it resolves later.  Although she feels like her life is "boring" (she smiles), she reports good relationship with her children and her granddaughter at home.  Her granddaughter will go to college next year.  Although she used to enjoy playing softball or playing cards, she has lost interest in those.  She sleeps well.  She has fair energy and motivation.  She has fair concentration.  She denies SI. Although she still feels that other people are talking about the patient, she denies HI.  She feels less irritable.  She has less AH of voices; it occurs every few days. She does not feel bothered by it. She denies VH.    Visit Diagnosis:    ICD-10-CM   1. MDD (major depressive disorder), recurrent episode, moderate ([place]) F33.1     Past Psychiatric History: Please see initial evaluation for full details. I have reviewed the history. No updates at this time.     Past Medical History:  Past Medical History:  Diagnosis Date  . Back pain   . Bipolar 1 disorder ([place])   . Cancer [facility]) 2014   Colon Cancer  . Hypercholesteremia   . Hypertension   . Lateral epicondylitis  of elbow    right  . Migraine headache   . Nicotine addiction   . Obesity    History of  . OD (overdose of drug)    hospitalized for 11 days   . Psychosis [facility])     Past Surgical History:  Procedure Laterality Date  . ABDOMINAL HYSTERECTOMY    . BIOPSY N/A 08/13/2014   Procedure: BIOPSY;  Surgeon:  Daneil Dolin, MD;  Location: [facility] ORS;  Service: Endoscopy;  Laterality: N/A;  . BREAST BIOPSY  10/18/2011   Procedure: BREAST BIOPSY;  Surgeon: Jamesetta So, MD;  Location: [facility] ORS;  Service: General;  Laterality: Left;  . COLON RESECTION N/A 06/23/2013   Procedure: HAND ASSISTED LAPAROSCOPIC PARTIAL COLECTOMY;  Surgeon: Jamesetta So, MD;  Location: [facility] ORS;  Service: General;  Laterality: N/A;  . COLONOSCOPY    . COLONOSCOPY N/A 05/29/2013   DDU:[id number] mass most likely representing colorectal cancer S/ P biospy.Multiple colonic and rectal polyps removed/treated as described above. Colonic diverticulosis  . COLONOSCOPY WITH PROPOFOL N/A 08/13/2014   RMR: Status post sigmoid colectomy. Multiple colonic polyps removed as described above. Redundant colon. Pan colonic diverticuloisi  . COLONOSCOPY WITH PROPOFOL N/A 05/22/2016   Procedure: COLONOSCOPY WITH PROPOFOL;  Surgeon: Daneil Dolin, MD;  Location: [facility] ENDO SUITE;  Service: Endoscopy;  Laterality: N/A;  7:30 am  . ESOPHAGEAL DILATION N/A 08/13/2014   Procedure: Posey;  Surgeon: Daneil Dolin, MD;  Location: [facility] ORS;  Service: Endoscopy;  Laterality: N/A;  . ESOPHAGOGASTRODUODENOSCOPY N/A 12/26/2012   CWC:BJSEGBT reflux esophagitis. Gastric and duodenal bulbar erosions-status post gastric biopsynegative H.pylori  . ESOPHAGOGASTRODUODENOSCOPY (EGD) WITH PROPOFOL N/A 08/13/2014  RMR: Small benign cystic-appearing lesion in distal esophagus of doubtful clinical significance, otherwise normal esophagus, status post Maloney dilation. Small hiatal hernia, some retained gastric contents (query delayed gastric emptying.)  . partial hysterectomy    . POLYPECTOMY N/A 08/13/2014   Procedure: POLYPECTOMY;  Surgeon: Daneil Dolin, MD;  Location: [facility] ORS;  Service: Endoscopy;  Laterality: N/A;    Family Psychiatric History: Please see initial evaluation for full details. I have reviewed the history. No updates at this time.      Family History:  Family History  Problem Relation Age of Onset  . Drug abuse Mother   . Colon cancer Father        diagnosed in his 106s  . Drug abuse Father   . HIV/AIDS Brother   . Anesthesia problems Neg Hx   . Hypotension Neg Hx   . Malignant hyperthermia Neg Hx   . Pseudochol deficiency Neg Hx     Social History:  Social History   Socioeconomic History  . Marital status: Legally Separated    Spouse name: Not on file  . Number of children: 3  . Years of education: Not on file  . Highest education level: Not on file  Occupational History  . Not on file  Social Needs  . Financial resource strain: Not on file  . Food insecurity:    Worry: Not on file    Inability: Not on file  . Transportation needs:    Medical: Not on file    Non-medical: Not on file  Tobacco Use  . Smoking status: Current Every Day Smoker    Packs/day: 0.50    Years: 38.00    Pack years: 19.00    Types: Cigarettes    Last attempt to quit: 06/19/2014    Years since quitting: 3.9  . Smokeless tobacco: Never Used  . Tobacco comment: vapor only  Substance and Sexual Activity  . Alcohol use: No    Alcohol/week: 0.0 standard drinks  . Drug use: No  . Sexual activity: Yes    Birth control/protection: Surgical    Comment: hyst  Lifestyle  . Physical activity:    Days per week: Not on file    Minutes per session: Not on file  . Stress: Not on file  Relationships  . Social connections:    Talks on phone: Not on file    Gets together: Not on file    Attends religious service: Not on file    Active member of club or organization: Not on file    Attends meetings of clubs or organizations: Not on file    Relationship status: Not on file  Other Topics Concern  . Not on file  Social History Narrative  . Not on file    Allergies: No Known Allergies  Metabolic Disorder Labs: Lab Results  Component Value Date   HGBA1C 5.5 01/01/2017   MPG 111 01/01/2017   MPG 114 02/29/2016   Lab  Results  Component Value Date   PROLACTIN 5.3 01/01/2017   Lab Results  Component Value Date   CHOL 256 (H) 01/01/2017   TRIG 177 (H) 01/01/2017   HDL 56 01/01/2017   CHOLHDL 4.6 01/01/2017   VLDL 35 01/01/2017   LDLCALC 165 (H) 01/01/2017   LDLCALC 122 (H) 02/29/2016   Lab Results  Component Value Date   TSH 2.87 01/07/2018   TSH 3.689 01/01/2017    Therapeutic Level Labs: No results found for: LITHIUM Lab Results  Component Value Date  VALPROATE 46.0 (L) 03/28/2010   VALPROATE 51.0 12/18/2008   No components found for:  CBMZ  Current Medications: Current Outpatient Medications  Medication Sig Dispense Refill  . Ascorbic Acid (VITAMIN C) 250 MG CHEW Chew by mouth daily.    . benazepril (LOTENSIN) 20 MG tablet     . Cholecalciferol (VITAMIN D) 2000 units CAPS Take by mouth daily.    Marland Kitchen FLUoxetine (PROZAC) 40 MG capsule Take 1 capsule (40 mg total) by mouth daily. 90 capsule 0  . hydrOXYzine (ATARAX/VISTARIL) 25 MG tablet Take 1 tablet (25 mg total) by mouth 3 (three) times daily as needed for anxiety. (Patient taking differently: Take 25 mg by mouth daily. ) 30 tablet 0  . IBU 800 MG tablet Take 1 tablet by mouth every 6 (six) hours as needed.    . Lurasidone HCl 60 MG TABS Take 1 tablet (60 mg total) by mouth daily with breakfast. 90 tablet 0  . omeprazole (PRILOSEC) 20 MG capsule     . pantoprazole (PROTONIX) 40 MG tablet Take 1 tablet (40 mg total) by mouth daily. 30 tablet 0  . traMADol (ULTRAM) 50 MG tablet Take 1 tablet (50 mg total) by mouth every 6 (six) hours as needed. 15 tablet 0  . UNABLE TO FIND Vit B-1 daily    . VITAMIN A PO Take by mouth daily.    . vitamin E 400 UNIT capsule Take 400 Units by mouth daily.     No current facility-administered medications for this visit.      Musculoskeletal: Strength & Muscle Tone: within normal limits Gait & Station: normal Patient leans: N/A  Psychiatric Specialty Exam: Review of Systems   Psychiatric/Behavioral: Positive for depression. Negative for hallucinations, memory loss, substance abuse and suicidal ideas. The patient is not nervous/anxious and does not have insomnia.   All other systems reviewed and are negative.   Blood pressure (!) 154/96, pulse 90, height 5\' 6"  (1.676 m), weight 207 lb (93.9 kg), SpO2 94 %.Body mass index is 33.41 kg/m.  General Appearance: Fairly Groomed  Eye Contact:  Good  Speech:  Clear and Coherent  Volume:  Normal  Mood:  "better"  Affect:  Appropriate, Congruent and Restricted, more relaxed, improving  Thought Process:  Coherent  Orientation:  Full (Time, Place, and Person)  Thought Content: Logical , + vague paranoia  Suicidal Thoughts:  No  Homicidal Thoughts:  No  Memory:  Immediate;   Good  Judgement:  Good  Insight:  Fair  Psychomotor Activity:  Normal  Concentration:  Concentration: Good and Attention Span: Good  Recall:  Good  Fund of Knowledge: Good  Language: Good  Akathisia:  No  Handed:  Right  AIMS (if indicated): not done  Assets:  Communication Skills Desire for Improvement  ADL's:  Intact  Cognition: WNL  Sleep:  Good   Screenings: AIMS     Admission (Discharged) from 12/30/2016 in [facility] [place] [facility]  AIMS Total Score  0    AUDIT     Admission (Discharged) from 12/30/2016 in [place] [facility] Admission (Discharged) from 08/06/2013 in [place] [facility]  Alcohol Use Disorder Identification Test Final Score (AUDIT)  0  0    PHQ2-9     Office Visit from 01/29/2017 in [facility]  PHQ-2 Total Score  4  PHQ-9 Total Score  10       Assessment and Plan:  Ashley Patrick is a 62 y.o.  year old female with a history of depression with psychotic features, who presents for follow up appointment for MDD (major depressive disorder), recurrent episode, moderate ([place])  # MDD, moderate, recurrent without psychotic features # r/o  PTSD, complex # r/o borderline personality disorder There has been significant improvement in depressive symptoms and irritability since she reinitiated fluoxetine. Will continue fluoxetine to target depression. Will continue latuda as adjunctive treatment for depression and hallucinations. Discussed potential metabolic side effect. Discussed behavioral activation. She is encouraged to continue to see a therapist.   # HI Patient denies any HI on today's evaluation.  Will continue to monitor.   # Hypertension She is followed by her PCP.   Plan I have reviewed and updated plans as below 1. Continue fluoxetine 40 mg daily 2. ContinueLatuda 60 mg daily 3.Return to clinic intwo months for 15 mins   Past trials of medication: fluoxetine,mirtazapine, Abilify(AH of bugs), risperidone  The duration of this appointment visit was 30 minutes of face-to-face time with the patient.  Greater than 50% of this time was spent in counseling, explanation of  diagnosis, planning of further management, and coordination of care.  [name], MD 05/15/2018, 10:01 AM

## 2018-05-15 ENCOUNTER — Ambulatory Visit (INDEPENDENT_AMBULATORY_CARE_PROVIDER_SITE_OTHER): Payer: Medicare Other | Admitting: Psychiatry

## 2018-05-15 ENCOUNTER — Encounter (HOSPITAL_COMMUNITY): Payer: Self-pay | Admitting: Psychiatry

## 2018-05-15 VITALS — BP 154/96 | HR 90 | Ht 66.0 in | Wt 207.0 lb

## 2018-05-15 DIAGNOSIS — F331 Major depressive disorder, recurrent, moderate: Secondary | ICD-10-CM | POA: Diagnosis not present

## 2018-05-15 NOTE — Patient Instructions (Signed)
1. Continue fluoxetine 40 mg daily 2. ContinueLatuda 60 mg daily 3.Return to clinic intwo months for 15 mins

## 2018-05-27 NOTE — Progress Notes (Deleted)
[facility] MD/PA/NP OP Progress Note  05/27/2018 8:53 AM Ashley Patrick  MRN:  [id number]  Chief Complaint:  HPI: *** Visit Diagnosis: No diagnosis found.  Past Psychiatric History: Please see initial evaluation for full details. I have reviewed the history. No updates at this time.     Past Medical History:  Past Medical History:  Diagnosis Date  . Back pain   . Bipolar 1 disorder ([place])   . Cancer [facility]) 2014   Colon Cancer  . Hypercholesteremia   . Hypertension   . Lateral epicondylitis  of elbow    right  . Migraine headache   . Nicotine addiction   . Obesity    History of  . OD (overdose of drug)    hospitalized for 11 days   . Psychosis [facility])     Past Surgical History:  Procedure Laterality Date  . ABDOMINAL HYSTERECTOMY    . BIOPSY N/A 08/13/2014   Procedure: BIOPSY;  Surgeon: Daneil Dolin, MD;  Location: [facility] ORS;  Service: Endoscopy;  Laterality: N/A;  . BREAST BIOPSY  10/18/2011   Procedure: BREAST BIOPSY;  Surgeon: Jamesetta So, MD;  Location: [facility] ORS;  Service: General;  Laterality: Left;  . COLON RESECTION N/A 06/23/2013   Procedure: HAND ASSISTED LAPAROSCOPIC PARTIAL COLECTOMY;  Surgeon: Jamesetta So, MD;  Location: [facility] ORS;  Service: General;  Laterality: N/A;  . COLONOSCOPY    . COLONOSCOPY N/A 05/29/2013   QIH:[id number] mass most likely representing colorectal cancer S/ P biospy.Multiple colonic and rectal polyps removed/treated as described above. Colonic diverticulosis  . COLONOSCOPY WITH PROPOFOL N/A 08/13/2014   RMR: Status post sigmoid colectomy. Multiple colonic polyps removed as described above. Redundant colon. Pan colonic diverticuloisi  . COLONOSCOPY WITH PROPOFOL N/A 05/22/2016   Procedure: COLONOSCOPY WITH PROPOFOL;  Surgeon: Daneil Dolin, MD;  Location: [facility];  Service: Endoscopy;  Laterality: N/A;  7:30 am  . ESOPHAGEAL DILATION N/A 08/13/2014   Procedure: [place];  Surgeon: Daneil Dolin, MD;  Location: [facility] ORS;   Service: Endoscopy;  Laterality: N/A;  . ESOPHAGOGASTRODUODENOSCOPY N/A 12/26/2012   GLO:VFIEPPI reflux esophagitis. Gastric and duodenal bulbar erosions-status post gastric biopsynegative H.pylori  . ESOPHAGOGASTRODUODENOSCOPY (EGD) WITH PROPOFOL N/A 08/13/2014   RMR: Small benign cystic-appearing lesion in distal esophagus of doubtful clinical significance, otherwise normal esophagus, status post Maloney dilation. Small hiatal hernia, some retained gastric contents (query delayed gastric emptying.)  . partial hysterectomy    . POLYPECTOMY N/A 08/13/2014   Procedure: POLYPECTOMY;  Surgeon: Daneil Dolin, MD;  Location: [facility] ORS;  Service: Endoscopy;  Laterality: N/A;    Family Psychiatric History: Please see initial evaluation for full details. I have reviewed the history. No updates at this time.     Family History:  Family History  Problem Relation Age of Onset  . Drug abuse Mother   . Colon cancer Father        diagnosed in his 57s  . Drug abuse Father   . HIV/AIDS Brother   . Anesthesia problems Neg Hx   . Hypotension Neg Hx   . Malignant hyperthermia Neg Hx   . Pseudochol deficiency Neg Hx     Social History:  Social History   Socioeconomic History  . Marital status: Legally Separated    Spouse name: Not on file  . Number of children: 3  . Years of education: Not on file  . Highest education level: Not on file  Occupational History  .  Not on file  Social Needs  . Financial resource strain: Not on file  . Food insecurity:    Worry: Not on file    Inability: Not on file  . Transportation needs:    Medical: Not on file    Non-medical: Not on file  Tobacco Use  . Smoking status: Current Every Day Smoker    Packs/day: 0.50    Years: 38.00    Pack years: 19.00    Types: Cigarettes    Last attempt to quit: 06/19/2014    Years since quitting: 3.9  . Smokeless tobacco: Never Used  . Tobacco comment: vapor only  Substance and Sexual Activity  . Alcohol use: No     Alcohol/week: 0.0 standard drinks  . Drug use: No  . Sexual activity: Yes    Birth control/protection: Surgical    Comment: hyst  Lifestyle  . Physical activity:    Days per week: Not on file    Minutes per session: Not on file  . Stress: Not on file  Relationships  . Social connections:    Talks on phone: Not on file    Gets together: Not on file    Attends religious service: Not on file    Active member of club or organization: Not on file    Attends meetings of clubs or organizations: Not on file    Relationship status: Not on file  Other Topics Concern  . Not on file  Social History Narrative  . Not on file    Allergies: No Known Allergies  Metabolic Disorder Labs: Lab Results  Component Value Date   HGBA1C 5.5 01/01/2017   MPG 111 01/01/2017   MPG 114 02/29/2016   Lab Results  Component Value Date   PROLACTIN 5.3 01/01/2017   Lab Results  Component Value Date   CHOL 256 (H) 01/01/2017   TRIG 177 (H) 01/01/2017   HDL 56 01/01/2017   CHOLHDL 4.6 01/01/2017   VLDL 35 01/01/2017   LDLCALC 165 (H) 01/01/2017   LDLCALC 122 (H) 02/29/2016   Lab Results  Component Value Date   TSH 2.87 01/07/2018   TSH 3.689 01/01/2017    Therapeutic Level Labs: No results found for: LITHIUM Lab Results  Component Value Date   VALPROATE 46.0 (L) 03/28/2010   VALPROATE 51.0 12/18/2008   No components found for:  CBMZ  Current Medications: Current Outpatient Medications  Medication Sig Dispense Refill  . Ascorbic Acid (VITAMIN C) 250 MG CHEW Chew by mouth daily.    . benazepril (LOTENSIN) 20 MG tablet     . Cholecalciferol (VITAMIN D) 2000 units CAPS Take by mouth daily.    Marland Kitchen FLUoxetine (PROZAC) 40 MG capsule Take 1 capsule (40 mg total) by mouth daily. 90 capsule 0  . hydrOXYzine (ATARAX/VISTARIL) 25 MG tablet Take 1 tablet (25 mg total) by mouth 3 (three) times daily as needed for anxiety. (Patient taking differently: Take 25 mg by mouth daily. ) 30 tablet 0  . IBU  800 MG tablet Take 1 tablet by mouth every 6 (six) hours as needed.    . Lurasidone HCl 60 MG TABS Take 1 tablet (60 mg total) by mouth daily with breakfast. 90 tablet 0  . omeprazole (PRILOSEC) 20 MG capsule     . pantoprazole (PROTONIX) 40 MG tablet Take 1 tablet (40 mg total) by mouth daily. 30 tablet 0  . traMADol (ULTRAM) 50 MG tablet Take 1 tablet (50 mg total) by mouth every 6 (six) hours  as needed. 15 tablet 0  . UNABLE TO FIND Vit B-1 daily    . VITAMIN A PO Take by mouth daily.    . vitamin E 400 UNIT capsule Take 400 Units by mouth daily.     No current facility-administered medications for this visit.      Musculoskeletal: Strength & Muscle Tone: within normal limits Gait & Station: normal Patient leans: N/A  Psychiatric Specialty Exam: ROS  There were no vitals taken for this visit.There is no height or weight on file to calculate BMI.  General Appearance: Fairly Groomed  Eye Contact:  Good  Speech:  Clear and Coherent  Volume:  Normal  Mood:  {BHH MOOD:22306}  Affect:  {Affect (PAA):22687}  Thought Process:  Coherent  Orientation:  Full (Time, Place, and Person)  Thought Content: Logical   Suicidal Thoughts:  {ST/HT (PAA):22692}  Homicidal Thoughts:  {ST/HT (PAA):22692}  Memory:  Immediate;   Good  Judgement:  {Judgement (PAA):22694}  Insight:  {Insight (PAA):22695}  Psychomotor Activity:  Normal  Concentration:  Concentration: Good and Attention Span: Good  Recall:  Good  Fund of Knowledge: Good  Language: Good  Akathisia:  No  Handed:  Right  AIMS (if indicated): not done  Assets:  Communication Skills Desire for Improvement  ADL's:  Intact  Cognition: WNL  Sleep:  {BHH GOOD/FAIR/POOR:22877}   Screenings: AIMS     Admission (Discharged) from 12/30/2016 in [place] [facility]  AIMS Total Score  0    AUDIT     Admission (Discharged) from 12/30/2016 in [place] [facility] Admission (Discharged) from  08/06/2013 in [place] [facility]B  Alcohol Use Disorder Identification Test Final Score (AUDIT)  0  0    PHQ2-9     Office Visit from 01/29/2017 in [facility]  PHQ-2 Total Score  4  PHQ-9 Total Score  10       Assessment and Plan:  Ashley Patrick is a 62 y.o. year old female with a history of depression with psychotic features, who presents for follow up appointment for No diagnosis found.  #MDD, moderate, recurrent with psychotic features # r/o PTSD, complex # r/o borderline personality disorder There has been significant improvement in depressive symptoms and irritability since she reinitiated fluoxetine. Will continue fluoxetine to target depression. Will continue latuda as adjunctive treatment for depression and hallucinations. Discussed potential metabolic side effect. Discussed behavioral activation. She is encouraged to continue to see a therapist.   # HI Patient denies any HI on today's evaluation.  Will continue to monitor.   # Hypertension She is followed by her PCP.   Plan 1. Continue fluoxetine 40 mg daily 2. ContinueLatuda 60 mg daily 3.Return to clinic intwo months for 15 mins   Past trials of medication:fluoxetine,mirtazapine,Abilify(AH of bugs), risperidone  [name], MD 05/27/2018, 8:53 AM

## 2018-05-28 ENCOUNTER — Ambulatory Visit (HOSPITAL_COMMUNITY): Payer: Medicare Other | Admitting: Psychiatry

## 2018-05-29 ENCOUNTER — Ambulatory Visit (INDEPENDENT_AMBULATORY_CARE_PROVIDER_SITE_OTHER): Payer: Medicare Other | Admitting: Licensed Clinical Social Worker

## 2018-05-29 ENCOUNTER — Encounter (HOSPITAL_COMMUNITY): Payer: Self-pay | Admitting: Licensed Clinical Social Worker

## 2018-05-29 DIAGNOSIS — F331 Major depressive disorder, recurrent, moderate: Secondary | ICD-10-CM

## 2018-05-29 NOTE — Progress Notes (Signed)
   THERAPIST PROGRESS NOTE  Session Time: 9:00 am-9:40 am  Participation Level: Active  Behavioral Response: CasualAlertDepressed  Type of Therapy: Individual Therapy  Treatment Goals addressed: Coping  Interventions: CBT and Solution Focused  Summary: Ashley Patrick is a 62 y.o. female who presents oriented x5 (person, place, situation, time, and object), average height, overweight, casually dressed, appropriately groomed and cooperative to address mood. Patient has a history of medical treatment including cancer. She also has a history of mental health treatment including medication management. Patient denies suicidal and homicidal ideations. Patient admits to psychosis including auditory and visual hallucinations of an imp that tells her to harm herself. Patient denies substance abuse. Patient is at low risk for lethality at this time.  Physically: Patient denied any aches and pains. She is sleeping well. Her appetite is reduced. She is having a hard time swallowing. Spiritually/Values: No issues identified.  Relationships: Patient is interacting with her family and getting along with others. She feels like the holidays have help people be nice to each other.  Emotionally/Mentally/Behavior: Patient is trying to focus on herself. She has been concerned with others and helping others but is taking the time to focus on herself. Patient's mood has improved.    Patient engaged in session. She responded well to interventions. Patient continues to meet criteria for Major depressive disorder, recurrent episode, severe with psychosis. Patient will continue in outpatient therapy due to being the least restrictive service to meet her needs. Patient made minimal progress on her goals at this times.   Suicidal/Homicidal: Nowithout intent/plan  Therapist Response: Therapist reviewed patient's recent thoughts and behaviors. Therapist utilized CBT to address mood. Therapist processed patient's feelings  to identify triggers for mood. Therapist discussed acceptance and taking care of herself.   Plan: Return again in 3-4 weeks.  Diagnosis: Axis I: Major Depression, Recurrent severe    Axis II: No diagnosis    Glori Bickers, LCSW 05/29/2018

## 2018-06-19 ENCOUNTER — Emergency Department (HOSPITAL_COMMUNITY): Payer: Medicare Other

## 2018-06-19 ENCOUNTER — Encounter (HOSPITAL_COMMUNITY): Payer: Self-pay | Admitting: Emergency Medicine

## 2018-06-19 ENCOUNTER — Emergency Department (HOSPITAL_COMMUNITY)
Admission: EM | Admit: 2018-06-19 | Discharge: 2018-06-19 | Disposition: A | Discharge status: Home / Self Care | Payer: Medicare Other | Attending: Emergency Medicine | Admitting: Emergency Medicine

## 2018-06-19 ENCOUNTER — Other Ambulatory Visit: Payer: Self-pay

## 2018-06-19 DIAGNOSIS — Z79899 Other long term (current) drug therapy: Secondary | ICD-10-CM | POA: Insufficient documentation

## 2018-06-19 DIAGNOSIS — I10 Essential (primary) hypertension: Secondary | ICD-10-CM | POA: Diagnosis not present

## 2018-06-19 DIAGNOSIS — F17210 Nicotine dependence, cigarettes, uncomplicated: Secondary | ICD-10-CM | POA: Diagnosis not present

## 2018-06-19 DIAGNOSIS — R51 Headache: Secondary | ICD-10-CM | POA: Insufficient documentation

## 2018-06-19 DIAGNOSIS — M542 Cervicalgia: Secondary | ICD-10-CM | POA: Diagnosis not present

## 2018-06-19 DIAGNOSIS — R519 Headache, unspecified: Secondary | ICD-10-CM

## 2018-06-19 DIAGNOSIS — R112 Nausea with vomiting, unspecified: Secondary | ICD-10-CM | POA: Diagnosis not present

## 2018-06-19 LAB — CBC WITH DIFFERENTIAL/PLATELET
Abs Immature Granulocytes: 0.02 10*3/uL (ref 0.00–0.07)
Basophils Absolute: 0 10*3/uL (ref 0.0–0.1)
Basophils Relative: 0 %
EOS ABS: 0.1 10*3/uL (ref 0.0–0.5)
EOS PCT: 1 %
HCT: 45.8 % (ref 36.0–46.0)
HEMOGLOBIN: 15 g/dL (ref 12.0–15.0)
Immature Granulocytes: 0 %
LYMPHS ABS: 2.1 10*3/uL (ref 0.7–4.0)
LYMPHS PCT: 29 %
MCH: 28.3 pg (ref 26.0–34.0)
MCHC: 32.8 g/dL (ref 30.0–36.0)
MCV: 86.4 fL (ref 80.0–100.0)
MONO ABS: 0.6 10*3/uL (ref 0.1–1.0)
Monocytes Relative: 8 %
Neutro Abs: 4.3 10*3/uL (ref 1.7–7.7)
Neutrophils Relative %: 62 %
Platelets: 228 10*3/uL (ref 150–400)
RBC: 5.3 MIL/uL — ABNORMAL HIGH (ref 3.87–5.11)
RDW: 14.5 % (ref 11.5–15.5)
WBC: 7 10*3/uL (ref 4.0–10.5)
nRBC: 0 % (ref 0.0–0.2)

## 2018-06-19 LAB — BASIC METABOLIC PANEL
ANION GAP: 12 (ref 5–15)
BUN: 11 mg/dL (ref 6–20)
CALCIUM: 9.9 mg/dL (ref 8.9–10.3)
CO2: 23 mmol/L (ref 22–32)
CREATININE: 0.97 mg/dL (ref 0.44–1.00)
Chloride: 104 mmol/L (ref 98–111)
GLUCOSE: 91 mg/dL (ref 70–99)
Potassium: 3.2 mmol/L — ABNORMAL LOW (ref 3.5–5.1)
Sodium: 139 mmol/L (ref 135–145)

## 2018-06-19 LAB — TROPONIN I: Troponin I: <0.03 ng/mL (ref <0.03)

## 2018-06-19 LAB — INFLUENZA PANEL BY PCR (TYPE A & B)
INFLBPCR: NEGATIVE
Influenza A By PCR: NEGATIVE

## 2018-06-19 MED ORDER — METOCLOPRAMIDE HCL 5 MG/ML IJ SOLN
10.0000 mg | Freq: Once | INTRAMUSCULAR | Status: AC
  Administered 2018-06-19: 10 mg via INTRAVENOUS
  Filled 2018-06-19: qty 2

## 2018-06-19 MED ORDER — POTASSIUM CHLORIDE CRYS ER 20 MEQ PO TBCR
20.0000 meq | EXTENDED_RELEASE_TABLET | Freq: Once | ORAL | Status: AC
  Administered 2018-06-19: 20 meq via ORAL
  Filled 2018-06-19: qty 1

## 2018-06-19 MED ORDER — SODIUM CHLORIDE 0.9 % IV BOLUS
1000.0000 mL | Freq: Once | INTRAVENOUS | Status: AC
  Administered 2018-06-19: 1000 mL via INTRAVENOUS

## 2018-06-19 MED ORDER — DEXAMETHASONE SODIUM PHOSPHATE 10 MG/ML IJ SOLN
10.0000 mg | Freq: Once | INTRAMUSCULAR | Status: AC
  Administered 2018-06-19: 10 mg via INTRAVENOUS
  Filled 2018-06-19: qty 1

## 2018-06-19 NOTE — ED Triage Notes (Signed)
Patient reports eye pain with headache and neck pain, emesis that started 2 days ago.

## 2018-06-19 NOTE — ED Provider Notes (Signed)
[facility] EMERGENCY DEPARTMENT Provider Note   CSN: [id number] Arrival date & time: 06/19/18  1547     History   Chief Complaint Chief Complaint  Patient presents with  . Headache    HPI Ashley Patrick is a 63 y.o. female.  She is complaining of chest pain that started about 2 days ago that she thought was gas.  She said the chest pain went away but then it turned into a headache that is been frontal and throbbing 8 out of 10 with some associated pain in her eyes and nausea with vomiting.  She said she has had some chills.  No documented fever.  Currently no blurry vision or double vision no chest pain no shortness of breath known abdominal pain no urinary symptoms no numbness or weakness.  No trauma.  She said she has a history of migraines but cannot remember if this is like that or not.  The history is provided by the patient.  Headache   This is a new problem. The current episode started 2 days ago. The problem occurs constantly. The problem has not changed since onset.The headache is associated with bright light. The pain is located in the frontal region. The quality of the pain is described as throbbing. The pain is at a severity of 8/10. The pain does not radiate. Associated symptoms include a fever (?), nausea and vomiting. Pertinent negatives include no near-syncope, no syncope and no shortness of breath. She has tried nothing for the symptoms. The treatment provided no relief.    Past Medical History:  Diagnosis Date  . Back pain   . Bipolar 1 disorder ([place])   . Cancer [facility]) 2014   Colon Cancer  . Hypercholesteremia   . Hypertension   . Lateral epicondylitis  of elbow    right  . Migraine headache   . Nicotine addiction   . Obesity    History of  . OD (overdose of drug)    hospitalized for 11 days   . Psychosis [facility])     Patient Active Problem List   Diagnosis Date Noted  . Well woman exam with routine gynecological exam 01/29/2017  . MDD (major depressive  disorder), recurrent, severe, with psychosis ([place]) 12/30/2016  . Tobacco abuse counseling 09/13/2016  . Abnormal CT scan, colon 05/02/2016  . GERD (gastroesophageal reflux disease) 05/02/2016  . Hiatal hernia   . History of colonic polyps   . Diverticulosis of colon without hemorrhage   . Constipation 07/22/2014  . History of colon cancer 07/20/2014  . Dysphagia 07/20/2014  . Lower abdominal pain 09/30/2013  . Anxiety, generalized 08/08/2013  . 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 Noncompliance with medications 08/07/2013  . Colon cancer ([place]) 06/23/2013  . Rectal bleeding 05/09/2013  . Gastritis 05/09/2013  . LLQ pain 11/28/2012  . Hemorrhagic cyst of ovary 11/28/2012  . SHOULDER PAIN, LEFT 05/18/2010  . ACUTE BRONCHITIS 09/03/2008  . UNSPECIFIED URINARY INCONTINENCE 09/03/2008  . ACUTE CYSTITIS 07/19/2008  . Abdominal pain, generalized 07/09/2008  . OBESITY 11/22/2007  . UNSPECIFIED PSYCHOSIS 11/22/2007  . MIGRAINE HEADACHE 11/22/2007  . HYPERTENSION 11/22/2007  . BACK PAIN 11/22/2007  . LATERAL EPICONDYLITIS OF ELBOW 11/22/2007    Past Surgical History:  Procedure Laterality Date  . ABDOMINAL HYSTERECTOMY    . BIOPSY N/A 08/13/2014   Procedure: BIOPSY;  Surgeon: Daneil Dolin, MD;  Location: [facility] ORS;  Service: Endoscopy;  Laterality: N/A;  . BREAST BIOPSY  10/18/2011  Procedure: BREAST BIOPSY;  Surgeon: Jamesetta So, MD;  Location: [facility] ORS;  Service: General;  Laterality: Left;  . COLON RESECTION N/A 06/23/2013   Procedure: HAND ASSISTED LAPAROSCOPIC PARTIAL COLECTOMY;  Surgeon: Jamesetta So, MD;  Location: [facility] ORS;  Service: General;  Laterality: N/A;  . COLONOSCOPY    . COLONOSCOPY N/A 05/29/2013   AVW:UJWJXBJ mass most likely representing colorectal cancer S/ P biospy.Multiple colonic and rectal polyps removed/treated as described above. Colonic diverticulosis  . COLONOSCOPY WITH PROPOFOL N/A 08/13/2014   RMR: Status post sigmoid  colectomy. Multiple colonic polyps removed as described above. Redundant colon. Pan colonic diverticuloisi  . COLONOSCOPY WITH PROPOFOL N/A 05/22/2016   Procedure: COLONOSCOPY WITH PROPOFOL;  Surgeon: Daneil Dolin, MD;  Location: [facility] ENDO SUITE;  Service: Endoscopy;  Laterality: N/A;  7:30 am  . ESOPHAGEAL DILATION N/A 08/13/2014   Procedure: [place];  Surgeon: Daneil Dolin, MD;  Location: [facility] ORS;  Service: Endoscopy;  Laterality: N/A;  . ESOPHAGOGASTRODUODENOSCOPY N/A 12/26/2012   YNW:GNFAOZH reflux esophagitis. Gastric and duodenal bulbar erosions-status post gastric biopsynegative H.pylori  . ESOPHAGOGASTRODUODENOSCOPY (EGD) WITH PROPOFOL N/A 08/13/2014   RMR: Small benign cystic-appearing lesion in distal esophagus of doubtful clinical significance, otherwise normal esophagus, status post [place]oney dilation. Small hiatal hernia, some retained gastric contents (query delayed gastric emptying.)  . partial hysterectomy    . POLYPECTOMY N/A 08/13/2014   Procedure: POLYPECTOMY;  Surgeon: Daneil Dolin, MD;  Location: [facility] ORS;  Service: Endoscopy;  Laterality: N/A;     OB History    Gravida  4   Para  3   Term  3   Preterm      AB  1   Living  3     SAB      TAB  1   Ectopic      Multiple      Live Births  3            Home Medications    Prior to Admission medications   Medication Sig Start Date End Date Taking? Authorizing Provider  Ascorbic Acid (VITAMIN C) 250 MG CHEW Chew by mouth daily.    [provider]  benazepril (LOTENSIN) 20 MG tablet  11/20/17   [provider]  Cholecalciferol (VITAMIN D) 2000 units CAPS Take by mouth daily.    [provider]  FLUoxetine (PROZAC) 40 MG capsule Take 1 capsule (40 mg total) by mouth daily. 04/26/18   [name], MD  hydrOXYzine (ATARAX/VISTARIL) 25 MG tablet Take 1 tablet (25 mg total) by mouth 3 (three) times daily as needed for anxiety. Patient taking differently:  Take 25 mg by mouth daily.  01/06/17   Starkes-Perry, Gayland Curry, FNP  IBU 800 MG tablet Take 1 tablet by mouth every 6 (six) hours as needed. 12/14/17   [provider]  Lurasidone HCl 60 MG TABS Take 1 tablet (60 mg total) by mouth daily with breakfast. 04/26/18   [name], MD  omeprazole (PRILOSEC) 20 MG capsule  10/16/17   [provider]  pantoprazole (PROTONIX) 40 MG tablet Take 1 tablet (40 mg total) by mouth daily. 10/26/17   Dorie Rank, MD  traMADol (ULTRAM) 50 MG tablet Take 1 tablet (50 mg total) by mouth every 6 (six) hours as needed. 12/27/17   Daleen Bo, MD  UNABLE TO FIND Vit B-1 daily    [provider]  VITAMIN A PO Take by mouth daily.    [provider]  vitamin E 400 UNIT capsule Take 400 Units by mouth daily.    [provider]    Family History Family History  Problem Relation Age of Onset  . Drug abuse Mother   . Colon cancer Father        diagnosed in his 39s  . Drug abuse Father   . HIV/AIDS Brother   . Anesthesia problems Neg Hx   . Hypotension Neg Hx   . Malignant hyperthermia Neg Hx   . Pseudochol deficiency Neg Hx     Social History Social History   Tobacco Use  . Smoking status: Current Every Day Smoker    Packs/day: 0.50    Years: 38.00    Pack years: 19.00    Types: Cigarettes    Last attempt to quit: 06/19/2014    Years since quitting: 4.0  . Smokeless tobacco: Never Used  . Tobacco comment: vapor only  Substance Use Topics  . Alcohol use: No    Alcohol/week: 0.0 standard drinks  . Drug use: No     Allergies   Patient has no known allergies.   Review of Systems Review of Systems  Constitutional: Positive for chills and fever (?).  HENT: Negative for sore throat.   Eyes: Positive for pain.  Respiratory: Negative for shortness of breath.   Cardiovascular: Positive for chest pain. Negative for syncope and near-syncope.  Gastrointestinal: Positive for nausea and vomiting. Negative for  abdominal pain.  Genitourinary: Negative for dysuria.  Musculoskeletal: Positive for neck pain. Negative for back pain.  Skin: Negative for rash.  Neurological: Positive for headaches. Negative for syncope, speech difficulty, weakness and numbness.     Physical Exam Updated Vital Signs BP (!) 165/110 (BP Location: Right Arm)   Pulse 98   Temp 98.9 F (37.2 C) (Oral)   Resp 18   Ht 5\' 4"  (1.626 m)   Wt 97.1 kg   SpO2 98%   BMI 36.73 kg/m   Physical Exam Vitals signs and nursing note reviewed.  Constitutional:      General: She is not in acute distress.    Appearance: She is well-developed.  HENT:     Head: Normocephalic and atraumatic.  Eyes:     Extraocular Movements: Extraocular movements intact.     Conjunctiva/sclera: Conjunctivae normal.     Pupils: Pupils are equal, round, and reactive to light.  Neck:     Musculoskeletal: Normal range of motion and neck supple.     Meningeal: Brudzinski's sign and Kernig's sign absent.  Cardiovascular:     Rate and Rhythm: Normal rate and regular rhythm.     Heart sounds: No murmur.  Pulmonary:     Effort: Pulmonary effort is normal. No respiratory distress.     Breath sounds: Normal breath sounds.  Abdominal:     Palpations: Abdomen is soft.     Tenderness: There is no abdominal tenderness.  Musculoskeletal: Normal range of motion.        General: No tenderness or signs of injury.  Skin:    General: Skin is warm and dry.     Capillary Refill: Capillary refill takes less than 2 seconds.  Neurological:     Mental Status: She is alert and oriented to person, place, and time.     GCS: GCS eye subscore is 4. GCS verbal subscore is 5. GCS motor subscore is 6.     Cranial Nerves: No cranial nerve deficit.     Sensory: No sensory deficit.  Motor: No weakness.      ED Treatments / Results  Labs (all labs ordered are listed, but only abnormal results are displayed) Labs Reviewed  BASIC METABOLIC PANEL - Abnormal; Notable  for the following components:      Result Value   Potassium 3.2 (*)    All other components within normal limits  CBC WITH DIFFERENTIAL/PLATELET - Abnormal; Notable for the following components:   RBC 5.30 (*)    All other components within normal limits  TROPONIN I  INFLUENZA PANEL BY PCR (TYPE A & B)    EKG EKG Interpretation  Date/Time:  Wednesday June 19 2018 18:38:01 EST Ventricular Rate:  87 PR Interval:    QRS Duration: 86 QT Interval:  387 QTC Calculation: 466 R Axis:   30 Text Interpretation:  Sinus rhythm Probable left atrial enlargement Left ventricular hypertrophy Baseline wander in lead(s) II III aVF V5 no acute changes from prior 5/19 Confirmed by Aletta Edouard [phone]) on 06/19/2018 6:40:50 PM   Radiology Ct Head Wo Contrast  Result Date: 06/19/2018 CLINICAL DATA:  Headache. Neck pain. Vomiting. Symptoms for 2 days. No reported injury. EXAM: CT HEAD WITHOUT CONTRAST TECHNIQUE: Contiguous axial images were obtained from the base of the skull through the vertex without intravenous contrast. COMPARISON:  04/04/2010 head CT. FINDINGS: Brain: No evidence of parenchymal hemorrhage or extra-axial fluid collection. No mass lesion, mass effect, or midline shift. No CT evidence of acute infarction. Nonspecific mild subcortical and periventricular white matter hypodensity, most in keeping with chronic small vessel ischemic change. Cerebral volume is age appropriate. No ventriculomegaly. Vascular: No acute abnormality. Skull: No evidence of calvarial fracture. Sinuses/Orbits: The visualized paranasal sinuses are essentially clear. Other:  The mastoid air cells are unopacified. IMPRESSION: 1. No evidence of acute intracranial abnormality. 2. Mild chronic small vessel ischemic changes in the cerebral white matter. Electronically Signed   By: Ilona Sorrel M.D.   On: 06/19/2018 19:12    Procedures Procedures (including critical care time)  Medications Ordered in ED Medications    sodium chloride 0.9 % bolus 1,000 mL (has no administration in time range)  metoCLOPramide (REGLAN) injection 10 mg (has no administration in time range)  dexamethasone (DECADRON) injection 10 mg (has no administration in time range)     Initial Impression / Assessment and Plan / ED Course  I have reviewed the triage vital signs and the nursing notes.  Pertinent labs & imaging results that were available during my care of the patient were reviewed by me and considered in my medical decision making (see chart for details).  Clinical Course as of Jun 20 916  Wed Jun 19, 2018  1915 Patient CT negative for any acute findings.  White count normal.   [MB]  1956 Patient's headache is improved here.  Her lab work and CAT scan of been unremarkable.  Unfortunately that lost her flu swab so the nurses swabbing her again.  Her potassium was a little low so we will give her an oral dose of potassium.   [MB]  2042 Patient's flu test negative.  She said her headache is much improved and she would like to go home.  We talked about her following with her primary care doctor for further evaluation of any worsening of her symptoms or return to the ER.   [MB]    Clinical Course User Index [MB] Hayden Rasmussen, MD     Final Clinical Impressions(s) / ED Diagnoses   Final diagnoses:  Bad headache  ED Discharge Orders    None       Hayden Rasmussen, MD 06/20/18 [phone]

## 2018-06-19 NOTE — Discharge Instructions (Addendum)
You were seen in the emergency department for a headache.  You had a CAT scan that did not show any cause of your symptoms along with some blood work that was mostly normal except for a slightly low potassium.  You are given some medications with improvement in your symptoms.  Please follow-up with your doctor for further evaluation and return if any worsening symptoms.

## 2018-07-09 ENCOUNTER — Encounter (HOSPITAL_COMMUNITY): Payer: Self-pay | Admitting: Licensed Clinical Social Worker

## 2018-07-09 ENCOUNTER — Ambulatory Visit (INDEPENDENT_AMBULATORY_CARE_PROVIDER_SITE_OTHER): Payer: Medicare Other | Admitting: Licensed Clinical Social Worker

## 2018-07-09 DIAGNOSIS — F331 Major depressive disorder, recurrent, moderate: Secondary | ICD-10-CM | POA: Diagnosis not present

## 2018-07-09 NOTE — Progress Notes (Signed)
   THERAPIST PROGRESS NOTE  Session Time: 8:55 am-9:15 am  Participation Level: Active  Behavioral Response: CasualAlertDepressed  Type of Therapy: Individual Therapy  Treatment Goals addressed: Coping  Interventions: CBT and Solution Focused  Summary: Ashley Patrick is a 63 y.o. female who presents oriented x5 (person, place, situation, time, and object), average height, overweight, casually dressed, appropriately groomed and cooperative to address mood. Patient has a history of medical treatment including cancer. She also has a history of mental health treatment including medication management. Patient denies suicidal and homicidal ideations. Patient admits to psychosis including auditory and visual hallucinations of an imp that tells her to harm herself. Patient denies substance abuse. Patient is at low risk for lethality at this time.  Physically: Patient has no energy. She is getting headaches. Patient's sleep is disrupted due to headaches. Patient is experiencing aches and pains. Spiritually/Values: No issues identified.  Relationships: Patient doesn't want to be bothered with others and doesn't want to talk. She gets annoyed by talking.  Emotionally/Mentally/Behavior: Patient is irritable. She doesn't want to be bothered. She ended the session early. She is going to the doctor to get her headaches checked.   Patient engaged in session. She responded well to interventions. Patient continues to meet criteria for Major depressive disorder, recurrent episode, severe with psychosis. Patient will continue in outpatient therapy due to being the least restrictive service to meet her needs. Patient made minimal progress on her goals at this times.   Suicidal/Homicidal: Nowithout intent/plan  Therapist Response: Therapist reviewed patient's recent thoughts and behaviors. Therapist utilized CBT to address mood. Therapist processed patient's feelings to identify triggers for mood. Therapist  discussed patient's irritability.   Plan: Return again in 3-4 weeks.  Diagnosis: Axis I: Major Depression, Recurrent severe    Axis II: No diagnosis    Glori Bickers, LCSW 07/09/2018

## 2018-07-30 ENCOUNTER — Ambulatory Visit (HOSPITAL_COMMUNITY): Payer: Medicare Other | Admitting: Licensed Clinical Social Worker

## 2018-08-08 NOTE — Progress Notes (Signed)
[facility] MD/PA/NP OP Progress Note  08/15/2018 8:46 AM Ashley Patrick  MRN:  [id number]  Chief Complaint:  Chief Complaint    Follow-up; Depression     HPI:  Patient presents for follow-up appointment for depression.  She states that she has been feeling good.  Although there were a period when she felt depressed and anxious at times, she was able to get through it.  She felt a little down on holidays as she was unable to have things for Christmas.  She is also concerned about her children, who have discordance with each other.  She reports good relationship with.  Although she enjoys puzzle book, she is unable to do it at times due to difficulty in concentration at times.  She sleeps well.  She has more energy and motivation.  She has fair appetite; she feels good about her weight loss.  She denies SI.  She denies panic attacks.  She had AH of harming herself and others; it occurred twice. Although she was disturbed by these voices, she "blocked it out" and denies any SI, HI. Although she wishes to stop taking her medication, she agrees to stay on medication to avoid relapse after being provided psychoeducation.   Wt Readings from Last 3 Encounters:  08/15/18 197 lb 9.6 oz (89.6 kg)  06/19/18 214 lb (97.1 kg)  05/15/18 207 lb (93.9 kg)    Visit Diagnosis:    ICD-10-CM   1. MDD (major depressive disorder), recurrent episode, moderate ([place]) F33.1     Past Psychiatric History: Please see initial evaluation for full details. I have reviewed the history. No updates at this time.     Past Medical History:  Past Medical History:  Diagnosis Date  . Back pain   . Bipolar 1 disorder ([place])   . Cancer [facility]) 2014   Colon Cancer  . Hypercholesteremia   . Hypertension   . Lateral epicondylitis  of elbow    right  . Migraine headache   . Nicotine addiction   . Obesity    History of  . OD (overdose of drug)    hospitalized for 11 days   . Psychosis [facility])     Past Surgical History:  Procedure  Laterality Date  . ABDOMINAL HYSTERECTOMY    . BIOPSY N/A 08/13/2014   Procedure: BIOPSY;  Surgeon: Daneil Dolin, MD;  Location: [facility] ORS;  Service: Endoscopy;  Laterality: N/A;  . BREAST BIOPSY  10/18/2011   Procedure: BREAST BIOPSY;  Surgeon: Jamesetta So, MD;  Location: [facility];  Service: General;  Laterality: Left;  . COLON RESECTION N/A 06/23/2013   Procedure: HAND ASSISTED LAPAROSCOPIC PARTIAL COLECTOMY;  Surgeon: Jamesetta So, MD;  Location: [facility];  Service: General;  Laterality: N/A;  . COLONOSCOPY    . COLONOSCOPY N/A 05/29/2013   IWL:NLGXQJJ mass most likely representing colorectal cancer S/ P biospy.Multiple colonic and rectal polyps removed/treated as described above. Colonic diverticulosis  . COLONOSCOPY WITH PROPOFOL N/A 08/13/2014   RMR: Status post sigmoid colectomy. Multiple colonic polyps removed as described above. Redundant colon. Pan colonic diverticuloisi  . COLONOSCOPY WITH PROPOFOL N/A 05/22/2016   Procedure: COLONOSCOPY WITH PROPOFOL;  Surgeon: Daneil Dolin, MD;  Location: [facility] SU[facility];  Service: Endoscopy;  Laterality: N/A;  7:30 am  . ESOPHAGEAL DILATION N/A 08/13/2014   Procedure: [place];  Surgeon: Daneil Dolin, MD;  Location: [facility];  Service: Endoscopy;  Laterality: N/A;  . ESOPHAGOGASTRODUODENOSCOPY N/A 12/26/2012  ZJI:RCVELFY reflux esophagitis. Gastric and duodenal bulbar erosions-status post gastric biopsynegative H.pylori  . ESOPHAGOGASTRODUODENOSCOPY (EGD) WITH PROPOFOL N/A 08/13/2014   RMR: Small benign cystic-appearing lesion in distal esophagus of doubtful clinical significance, otherwise normal esophagus, status post Maloney dilation. Small hiatal hernia, some retained gastric contents (query delayed gastric emptying.)  . partial hysterectomy    . POLYPECTOMY N/A 08/13/2014   Procedure: POLYPECTOMY;  Surgeon: Daneil Dolin, MD;  Location: [facility];  Service: Endoscopy;  Laterality: N/A;    Family Psychiatric History:  Please see initial evaluation for full details. I have reviewed the history. No updates at this time.     Family History:  Family History  Problem Relation Age of Onset  . Drug abuse Mother   . Colon cancer Father        diagnosed in his 36s  . Drug abuse Father   . HIV/AIDS Brother   . Anesthesia problems Neg Hx   . Hypotension Neg Hx   . Malignant hyperthermia Neg Hx   . Pseudochol deficiency Neg Hx     Social History:  Social History   Socioeconomic History  . Marital status: Legally Separated    Spouse name: Not on file  . Number of children: 3  . Years of education: Not on file  . Highest education level: Not on file  Occupational History  . Not on file  Social Needs  . Financial resource strain: Not on file  . Food insecurity:    Worry: Not on file    Inability: Not on file  . Transportation needs:    Medical: Not on file    Non-medical: Not on file  Tobacco Use  . Smoking status: Current Every Day Smoker    Packs/day: 0.50    Years: 38.00    Pack years: 19.00    Types: Cigarettes    Last attempt to quit: 06/19/2014    Years since quitting: 4.1  . Smokeless tobacco: Never Used  . Tobacco comment: vapor only  Substance and Sexual Activity  . Alcohol use: No    Alcohol/week: 0.0 standard drinks  . Drug use: No  . Sexual activity: Yes    Birth control/protection: Surgical    Comment: hyst  Lifestyle  . Physical activity:    Days per week: Not on file    Minutes per session: Not on file  . Stress: Not on file  Relationships  . Social connections:    Talks on phone: Not on file    Gets together: Not on file    Attends religious service: Not on file    Active member of club or organization: Not on file    Attends meetings of clubs or organizations: Not on file    Relationship status: Not on file  Other Topics Concern  . Not on file  Social History Narrative  . Not on file    Allergies: No Known Allergies  Metabolic Disorder Labs: Lab Results   Component Value Date   HGBA1C 5.5 01/01/2017   MPG 111 01/01/2017   MPG 114 02/29/2016   Lab Results  Component Value Date   PROLACTIN 5.3 01/01/2017   Lab Results  Component Value Date   CHOL 256 (H) 01/01/2017   TRIG 177 (H) 01/01/2017   HDL 56 01/01/2017   CHOLHDL 4.6 01/01/2017   VLDL 35 01/01/2017   LDLCALC 165 (H) 01/01/2017   LDLCALC 122 (H) 02/29/2016   Lab Results  Component Value Date   TSH 2.87 01/07/2018  TSH 3.689 01/01/2017    Therapeutic Level Labs: No results found for: LITHIUM Lab Results  Component Value Date   VALPROATE 46.0 (L) 03/28/2010   VALPROATE 51.0 12/18/2008   No components found for:  CBMZ  Current Medications: Current Outpatient Medications  Medication Sig Dispense Refill  . Ascorbic Acid (VITAMIN C) 250 MG CHEW Chew 1 tablet by mouth daily.     . benazepril (LOTENSIN) 20 MG tablet Take 20 mg by mouth daily.     . Cholecalciferol (VITAMIN D) 2000 units CAPS Take 2,000 Units by mouth daily.     Marland Kitchen FLUoxetine (PROZAC) 40 MG capsule Take 1 capsule (40 mg total) by mouth daily. 90 capsule 0  . Lurasidone HCl 60 MG TABS Take 1 tablet (60 mg total) by mouth daily with breakfast. 90 tablet 0  . thiamine (VITAMIN B-1) 100 MG tablet Take 100 mg by mouth daily.    Marland Kitchen VITAMIN A PO Take 1 tablet by mouth daily.     . vitamin E 400 UNIT capsule Take 400 Units by mouth daily.    . IBU 800 MG tablet Take 1 tablet by mouth every 6 (six) hours as needed for mild pain or moderate pain.      No current facility-administered medications for this visit.      Musculoskeletal: Strength & Muscle Tone: within normal limits Gait & Station: normal Patient leans: N/A  Psychiatric Specialty Exam: Review of Systems  Psychiatric/Behavioral: Positive for depression and hallucinations. Negative for memory loss, substance abuse and suicidal ideas. The patient is nervous/anxious. The patient does not have insomnia.   All other systems reviewed and are  negative.   Blood pressure (!) 153/82, pulse 82, height 5\' 4"  (1.626 m), weight 197 lb 9.6 oz (89.6 kg), SpO2 97 %.Body mass index is 33.92 kg/m.  General Appearance: Fairly Groomed  Eye Contact:  Good  Speech:  Clear and Coherent  Volume:  Normal  Mood:  "good"  Affect:  Appropriate, Congruent, Full Range and reactive  Thought Process:  Coherent  Orientation:  Full (Time, Place, and Person)  Thought Content: Logical   Suicidal Thoughts:  No  Homicidal Thoughts:  No  Memory:  Immediate;   Good  Judgement:  Good  Insight:  Good  Psychomotor Activity:  Normal  Concentration:  Concentration: Good and Attention Span: Good  Recall:  Good  Fund of Knowledge: Good  Language: Good  Akathisia:  No  Handed:  Right  AIMS (if indicated): not done  Assets:  Communication Skills Desire for Improvement  ADL's:  Intact  Cognition: WNL  Sleep:  Good   Screenings: AIMS     Admission (Discharged) from 12/30/2016 in [facility]  AIMS Total Score  0    AUDIT     Admission (Discharged) from 12/30/2016 in [place] [facility] Admission (Discharged) from 08/06/2013 in [facility]  Alcohol Use Disorder Identification Test Final Score (AUDIT)  0  0    PHQ2-9     Office Visit from 01/29/2017 in [facility]  PHQ-2 Total Score  4  PHQ-9 Total Score  10       Assessment and Plan:  LILO WALLINGTON is a 63 y.o. year old female with a history of depression with psychotic features, who presents for follow up appointment for MDD (major depressive disorder), recurrent episode, moderate ([place])  # MDD, moderate, recurrent without psychotic features # r/o PTSD, complex # r/o  borderline personality disorder Exam is notable for euthymic and reactive affect, and patient reports steady improvement in depressive symptoms and irritability since her last visit.  Will continue fluoxetine to target depression.  Will  continue Latuda as adjunctive treatment for depression and hallucinations.  Will consider up titration of this medication in the future if any worsening in AH.  Discussed potential metabolic side effect.  Discussed behavioral activation.  Provided psycho education of staying on the medication to avoid relapse in her symptoms (had more than two episodes of depression in the past)  # HI Although patient reports occasional AH of hurting other, it has decreased significantly, and she denies any HI.  Will continue to monitor.   # Hypertension She is followed by PCP.    Plan I have reviewed and updated plans as below 1. Continue fluoxetine 40 mg daily 2. ContinueLatuda 60 mg daily 3.Return to clinic inthree months for 15 mins - on trazodone 50 mg at night   Past trials of medication:fluoxetine,mirtazapine,Abilify(AH of bugs), risperidone  [name], MD 08/15/2018, 8:46 AM

## 2018-08-14 ENCOUNTER — Ambulatory Visit (INDEPENDENT_AMBULATORY_CARE_PROVIDER_SITE_OTHER): Payer: Medicare Other | Admitting: Urology

## 2018-08-14 ENCOUNTER — Other Ambulatory Visit (HOSPITAL_COMMUNITY)
Admission: RE | Admit: 2018-08-14 | Discharge: 2018-08-14 | Disposition: A | Discharge status: Home / Self Care | Payer: Medicare Other | Source: Ambulatory Visit | Attending: Urology | Admitting: Urology

## 2018-08-14 DIAGNOSIS — N3021 Other chronic cystitis with hematuria: Secondary | ICD-10-CM | POA: Diagnosis not present

## 2018-08-15 ENCOUNTER — Other Ambulatory Visit: Payer: Self-pay | Admitting: Urology

## 2018-08-15 ENCOUNTER — Ambulatory Visit (INDEPENDENT_AMBULATORY_CARE_PROVIDER_SITE_OTHER): Payer: Medicare Other | Admitting: Psychiatry

## 2018-08-15 ENCOUNTER — Encounter (HOSPITAL_COMMUNITY): Payer: Self-pay | Admitting: Psychiatry

## 2018-08-15 VITALS — BP 153/82 | HR 82 | Ht 64.0 in | Wt 197.6 lb

## 2018-08-15 DIAGNOSIS — F331 Major depressive disorder, recurrent, moderate: Secondary | ICD-10-CM

## 2018-08-15 DIAGNOSIS — N3021 Other chronic cystitis with hematuria: Secondary | ICD-10-CM

## 2018-08-15 MED ORDER — LURASIDONE HCL 60 MG PO TABS: 60.0000 mg | ORAL_TABLET | Freq: Every day | ORAL | 0 refills | Status: DC

## 2018-08-15 MED ORDER — FLUOXETINE HCL 40 MG PO CAPS: 40.0000 mg | ORAL_CAPSULE | Freq: Every day | ORAL | 0 refills | Status: DC

## 2018-08-15 NOTE — Patient Instructions (Signed)
1. Continue fluoxetine 40 mg daily 2. ContinueLatuda 60 mg daily 3.Return to clinic inthree months for 15 mins

## 2018-08-17 LAB — URINE CULTURE

## 2018-08-20 ENCOUNTER — Ambulatory Visit (INDEPENDENT_AMBULATORY_CARE_PROVIDER_SITE_OTHER): Payer: Medicare Other | Admitting: Licensed Clinical Social Worker

## 2018-08-20 ENCOUNTER — Encounter (HOSPITAL_COMMUNITY): Payer: Self-pay | Admitting: Licensed Clinical Social Worker

## 2018-08-20 DIAGNOSIS — F333 Major depressive disorder, recurrent, severe with psychotic symptoms: Secondary | ICD-10-CM | POA: Diagnosis not present
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
## 2018-08-30 ENCOUNTER — Ambulatory Visit (HOSPITAL_COMMUNITY): Admission: RE | Admit: 2018-08-30 | Payer: Medicare Other | Source: Ambulatory Visit

## 2018-08-30 ENCOUNTER — Encounter (HOSPITAL_COMMUNITY): Payer: Self-pay

## 2018-09-13 ENCOUNTER — Other Ambulatory Visit: Payer: Self-pay

## 2018-09-13 ENCOUNTER — Ambulatory Visit (HOSPITAL_COMMUNITY)
Admission: RE | Admit: 2018-09-13 | Discharge: 2018-09-13 | Disposition: A | Discharge status: Home / Self Care | Payer: Medicare Other | Source: Ambulatory Visit | Attending: Urology | Admitting: Urology

## 2018-09-13 DIAGNOSIS — N3021 Other chronic cystitis with hematuria: Secondary | ICD-10-CM | POA: Diagnosis not present

## 2018-09-13 LAB — POCT I-STAT CREATININE: Creatinine, Ser: 0.9 mg/dL (ref 0.44–1.00)

## 2018-09-13 MED ORDER — IOHEXOL 300 MG/ML  SOLN
100.0000 mL | Freq: Once | INTRAMUSCULAR | Status: AC | PRN
  Administered 2018-09-13: 100 mL via INTRAVENOUS

## 2018-09-26 ENCOUNTER — Ambulatory Visit (HOSPITAL_COMMUNITY): Payer: Medicare Other | Admitting: Licensed Clinical Social Worker

## 2018-09-26 DIAGNOSIS — R600 Localized edema: Secondary | ICD-10-CM | POA: Diagnosis not present

## 2018-09-26 DIAGNOSIS — M79604 Pain in right leg: Secondary | ICD-10-CM | POA: Diagnosis not present

## 2018-09-26 DIAGNOSIS — I509 Heart failure, unspecified: Secondary | ICD-10-CM | POA: Diagnosis not present

## 2018-09-26 DIAGNOSIS — M79605 Pain in left leg: Secondary | ICD-10-CM | POA: Diagnosis not present

## 2018-09-26 DIAGNOSIS — I10 Essential (primary) hypertension: Secondary | ICD-10-CM | POA: Diagnosis not present

## 2018-10-16 DIAGNOSIS — J301 Allergic rhinitis due to pollen: Secondary | ICD-10-CM | POA: Diagnosis not present

## 2018-10-16 DIAGNOSIS — Z79899 Other long term (current) drug therapy: Secondary | ICD-10-CM | POA: Diagnosis not present

## 2018-10-16 DIAGNOSIS — I10 Essential (primary) hypertension: Secondary | ICD-10-CM | POA: Diagnosis not present

## 2018-10-17 ENCOUNTER — Encounter (HOSPITAL_COMMUNITY): Payer: Self-pay | Admitting: Licensed Clinical Social Worker

## 2018-10-17 ENCOUNTER — Ambulatory Visit (INDEPENDENT_AMBULATORY_CARE_PROVIDER_SITE_OTHER): Payer: Medicare Other | Admitting: Licensed Clinical Social Worker

## 2018-10-17 ENCOUNTER — Other Ambulatory Visit: Payer: Self-pay

## 2018-10-17 DIAGNOSIS — F331 Major depressive disorder, recurrent, moderate: Secondary | ICD-10-CM

## 2018-10-17 NOTE — Progress Notes (Signed)
Virtual Visit via Telephone Note  I connected with Ashley Patrick on 10/17/18 at  9:00 AM EDT by telephone and verified that I am speaking with the correct person using two identifiers.  Location [place] Patient: Ashley Patrick Provider: Arrie Eastern, LCSW   I discussed the limitations, risks, security and privacy concerns of performing an evaluation and management service by telephone and the availability of in person appointments. I also discussed with the patient that there may be a patient responsible charge related to this service. The patient expressed understanding and agreed to proceed.   Participation Level: Active  Behavioral Response: CasualAlertDepressed  Type of Therapy: Individual Therapy  Treatment Goals addressed: Coping  Interventions: CBT and Solution Focused  Summary: Ashley Patrick is a 63 y.o. female who presents oriented x5 (person, place, situation, time, and object), average height, overweight, casually dressed, appropriately groomed and cooperative to address mood. Patient has a history of medical treatment including cancer. She also has a history of mental health treatment including medication management. Patient denies suicidal and homicidal ideations. Patient admits to psychosis including auditory and visual hallucinations of an imp that tells her to harm herself. Patient denies substance abuse. Patient is at low risk for lethality at this time.  Physically: Patient is doing well physically. Spiritually/Values: Patient is doing well spiritually. She is reading her bible.  Relationships: Patient is getting along with others.  Emotionally/Mentally/Behavior: Patient's mood has been good. She has had a few panic attacks related to [place]. Patient is managing though. Her daughter puts a cold rag on her neck and reminds her to breathe.  Patient engaged in session. She responded well to interventions. Patient continues to meet criteria for Major depressive  disorder, recurrent episode, severe with psychosis. Patient will continue in outpatient therapy due to being the least restrictive service to meet her needs. Patient made minimal progress on her goals at this times.   Suicidal/Homicidal: Nowithout intent/plan  Therapist Response: Therapist reviewed patient's recent thoughts and behaviors. Therapist utilized CBT to address mood. Therapist processed patient's feelings to identify triggers for mood. Therapist discussed how patient is dealing with [place].   Plan: Return again in 3-4 weeks.  Diagnosis: Axis I: Major Depression, Recurrent severe    Axis II: No diagnosis  I discussed the assessment and treatment plan with the patient. The patient was provided an opportunity to ask questions and all were answered. The patient agreed with the plan and demonstrated an understanding of the instructions.   The patient was advised to call back or seek an in-person evaluation if the symptoms worsen or if the condition fails to improve as anticipated.  I provided 30 minutes of non-face-to-face time during this encounter.   Ashley Bickers, LCSW 10/17/2018

## 2018-10-29 DIAGNOSIS — M25512 Pain in left shoulder: Secondary | ICD-10-CM | POA: Diagnosis not present

## 2018-10-29 DIAGNOSIS — M25511 Pain in right shoulder: Secondary | ICD-10-CM | POA: Diagnosis not present

## 2018-10-29 DIAGNOSIS — R202 Paresthesia of skin: Secondary | ICD-10-CM | POA: Diagnosis not present

## 2018-10-30 ENCOUNTER — Other Ambulatory Visit (HOSPITAL_COMMUNITY): Payer: Self-pay | Admitting: Family Medicine

## 2018-10-30 ENCOUNTER — Encounter (HOSPITAL_COMMUNITY): Payer: Self-pay

## 2018-10-30 ENCOUNTER — Other Ambulatory Visit: Payer: Self-pay

## 2018-10-30 ENCOUNTER — Ambulatory Visit (HOSPITAL_COMMUNITY)
Admission: RE | Admit: 2018-10-30 | Discharge: 2018-10-30 | Disposition: A | Discharge status: Home / Self Care | Payer: Medicare Other | Source: Ambulatory Visit | Attending: Family Medicine | Admitting: Family Medicine

## 2018-10-30 DIAGNOSIS — M25511 Pain in right shoulder: Secondary | ICD-10-CM

## 2018-10-30 DIAGNOSIS — R202 Paresthesia of skin: Secondary | ICD-10-CM

## 2018-10-30 DIAGNOSIS — M25512 Pain in left shoulder: Secondary | ICD-10-CM

## 2018-11-05 ENCOUNTER — Other Ambulatory Visit: Payer: Self-pay

## 2018-11-05 ENCOUNTER — Ambulatory Visit (HOSPITAL_COMMUNITY)
Admission: RE | Admit: 2018-11-05 | Discharge: 2018-11-05 | Disposition: A | Discharge status: Home / Self Care | Payer: Medicare Other | Source: Ambulatory Visit | Attending: Family Medicine | Admitting: Family Medicine

## 2018-11-05 DIAGNOSIS — M19012 Primary osteoarthritis, left shoulder: Secondary | ICD-10-CM | POA: Diagnosis not present

## 2018-11-05 DIAGNOSIS — M25511 Pain in right shoulder: Secondary | ICD-10-CM | POA: Diagnosis not present

## 2018-11-05 DIAGNOSIS — R202 Paresthesia of skin: Secondary | ICD-10-CM | POA: Insufficient documentation

## 2018-11-05 DIAGNOSIS — M25811 Other specified joint disorders, right shoulder: Secondary | ICD-10-CM | POA: Diagnosis not present

## 2018-11-05 DIAGNOSIS — M25512 Pain in left shoulder: Secondary | ICD-10-CM | POA: Diagnosis not present
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
## 2018-11-14 ENCOUNTER — Ambulatory Visit (INDEPENDENT_AMBULATORY_CARE_PROVIDER_SITE_OTHER): Payer: Medicare Other | Admitting: Psychiatry

## 2018-11-14 ENCOUNTER — Encounter (HOSPITAL_COMMUNITY): Payer: Self-pay | Admitting: Psychiatry

## 2018-11-14 ENCOUNTER — Other Ambulatory Visit: Payer: Self-pay

## 2018-11-14 DIAGNOSIS — F330 Major depressive disorder, recurrent, mild: Secondary | ICD-10-CM | POA: Diagnosis not present

## 2018-11-14 MED ORDER — FLUOXETINE HCL 40 MG PO CAPS: 40.0000 mg | ORAL_CAPSULE | Freq: Every day | ORAL | 0 refills | Status: DC

## 2018-11-14 MED ORDER — LURASIDONE HCL 60 MG PO TABS: 60.0000 mg | ORAL_TABLET | Freq: Every day | ORAL | 0 refills | Status: DC

## 2018-11-14 NOTE — Patient Instructions (Addendum)
1. Continue fluoxetine 40 mg daily 2. ContinueLatuda 60 mg daily 3.Next appointment: 8/18 at 9 AM

## 2018-11-27 ENCOUNTER — Ambulatory Visit: Payer: Medicare Other | Admitting: Urology

## 2019-01-13 DIAGNOSIS — Z79899 Other long term (current) drug therapy: Secondary | ICD-10-CM | POA: Diagnosis not present

## 2019-01-13 DIAGNOSIS — E559 Vitamin D deficiency, unspecified: Secondary | ICD-10-CM | POA: Diagnosis not present

## 2019-01-13 DIAGNOSIS — E785 Hyperlipidemia, unspecified: Secondary | ICD-10-CM | POA: Diagnosis not present

## 2019-01-13 DIAGNOSIS — I10 Essential (primary) hypertension: Secondary | ICD-10-CM | POA: Diagnosis not present

## 2019-01-13 DIAGNOSIS — R350 Frequency of micturition: Secondary | ICD-10-CM | POA: Diagnosis not present

## 2019-01-13 DIAGNOSIS — M810 Age-related osteoporosis without current pathological fracture: Secondary | ICD-10-CM | POA: Diagnosis not present

## 2019-01-16 DIAGNOSIS — N390 Urinary tract infection, site not specified: Secondary | ICD-10-CM | POA: Diagnosis not present

## 2019-01-16 DIAGNOSIS — J209 Acute bronchitis, unspecified: Secondary | ICD-10-CM | POA: Diagnosis not present

## 2019-01-16 DIAGNOSIS — Z0001 Encounter for general adult medical examination with abnormal findings: Secondary | ICD-10-CM | POA: Diagnosis not present

## 2019-01-16 DIAGNOSIS — M13861 Other specified arthritis, right knee: Secondary | ICD-10-CM | POA: Diagnosis not present

## 2019-01-16 DIAGNOSIS — R05 Cough: Secondary | ICD-10-CM | POA: Diagnosis not present

## 2019-01-16 DIAGNOSIS — G4700 Insomnia, unspecified: Secondary | ICD-10-CM | POA: Diagnosis not present

## 2019-01-16 DIAGNOSIS — Z1159 Encounter for screening for other viral diseases: Secondary | ICD-10-CM | POA: Diagnosis not present
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
## 2019-02-04 ENCOUNTER — Ambulatory Visit (HOSPITAL_COMMUNITY): Payer: Medicaid Other | Admitting: Psychiatry

## 2019-02-04 ENCOUNTER — Other Ambulatory Visit: Payer: Self-pay

## 2019-02-04 ENCOUNTER — Telehealth (HOSPITAL_COMMUNITY): Payer: Self-pay | Admitting: Psychiatry

## 2019-02-04 DIAGNOSIS — J9801 Acute bronchospasm: Secondary | ICD-10-CM | POA: Diagnosis not present

## 2019-02-04 DIAGNOSIS — N390 Urinary tract infection, site not specified: Secondary | ICD-10-CM | POA: Diagnosis not present

## 2019-02-04 DIAGNOSIS — R05 Cough: Secondary | ICD-10-CM | POA: Diagnosis not present
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
## 2019-02-04 NOTE — Telephone Encounter (Signed)
Called the patient for this morning's appointment. Although the patient was available, this examiner could not tell what she was saying likely due to limited phone connection. She is advised to reach out to the office to see if works better. If not, she is advised to reschedule the appointment.

## 2019-02-06 ENCOUNTER — Other Ambulatory Visit: Payer: Self-pay

## 2019-02-06 ENCOUNTER — Ambulatory Visit (HOSPITAL_COMMUNITY): Payer: Medicaid Other | Admitting: Psychiatry

## 2019-02-06 ENCOUNTER — Telehealth (HOSPITAL_COMMUNITY): Payer: Self-pay | Admitting: Psychiatry
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
## 2019-02-06 NOTE — Telephone Encounter (Signed)
Called twice for the appointment today. She did not answer the phone. Left voice message to contact the office.

## 2019-02-10 ENCOUNTER — Ambulatory Visit (INDEPENDENT_AMBULATORY_CARE_PROVIDER_SITE_OTHER): Payer: Medicare Other | Admitting: Psychiatry

## 2019-02-10 ENCOUNTER — Encounter (HOSPITAL_COMMUNITY): Payer: Self-pay | Admitting: Psychiatry

## 2019-02-10 ENCOUNTER — Other Ambulatory Visit: Payer: Self-pay

## 2019-02-10 DIAGNOSIS — F330 Major depressive disorder, recurrent, mild: Secondary | ICD-10-CM

## 2019-02-10 MED ORDER — FLUOXETINE HCL 40 MG PO CAPS: 40.0000 mg | ORAL_CAPSULE | Freq: Every day | ORAL | 0 refills | Status: DC

## 2019-02-10 MED ORDER — LURASIDONE HCL 60 MG PO TABS: 60.0000 mg | ORAL_TABLET | Freq: Every day | ORAL | 0 refills | Status: DC

## 2019-02-20 DIAGNOSIS — R05 Cough: Secondary | ICD-10-CM | POA: Diagnosis not present

## 2019-02-20 DIAGNOSIS — I10 Essential (primary) hypertension: Secondary | ICD-10-CM | POA: Diagnosis not present

## 2019-02-20 DIAGNOSIS — J9801 Acute bronchospasm: Secondary | ICD-10-CM | POA: Diagnosis not present

## 2019-02-20 DIAGNOSIS — R8281 Pyuria: Secondary | ICD-10-CM | POA: Diagnosis not present

## 2019-02-20 DIAGNOSIS — N390 Urinary tract infection, site not specified: Secondary | ICD-10-CM | POA: Diagnosis not present

## 2019-03-06 ENCOUNTER — Ambulatory Visit (HOSPITAL_COMMUNITY)
Admission: RE | Admit: 2019-03-06 | Discharge: 2019-03-06 | Disposition: A | Discharge status: Home / Self Care | Payer: Medicare Other | Source: Ambulatory Visit | Attending: Family Medicine | Admitting: Family Medicine

## 2019-03-06 ENCOUNTER — Other Ambulatory Visit (HOSPITAL_COMMUNITY): Payer: Self-pay | Admitting: Family Medicine

## 2019-03-06 ENCOUNTER — Other Ambulatory Visit: Payer: Self-pay

## 2019-03-06 DIAGNOSIS — J9801 Acute bronchospasm: Secondary | ICD-10-CM | POA: Diagnosis not present

## 2019-03-06 DIAGNOSIS — R05 Cough: Secondary | ICD-10-CM | POA: Diagnosis not present

## 2019-04-14 ENCOUNTER — Encounter (HOSPITAL_COMMUNITY): Payer: Self-pay | Admitting: Emergency Medicine

## 2019-04-14 ENCOUNTER — Other Ambulatory Visit: Payer: Self-pay

## 2019-04-14 DIAGNOSIS — Z5321 Procedure and treatment not carried out due to patient leaving prior to being seen by health care provider: Secondary | ICD-10-CM | POA: Insufficient documentation

## 2019-04-14 DIAGNOSIS — M542 Cervicalgia: Secondary | ICD-10-CM | POA: Diagnosis present

## 2019-04-14 NOTE — ED Triage Notes (Signed)
Pt c/o neck pain x 2 days that has gotten more severe today. Pt states there are knots on both sides of neck.

## 2019-04-15 ENCOUNTER — Emergency Department (HOSPITAL_COMMUNITY)
Admission: EM | Admit: 2019-04-15 | Discharge: 2019-04-15 | Disposition: A | Discharge status: Home / Self Care | Payer: Medicare Other | Attending: Emergency Medicine | Admitting: Emergency Medicine
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
## 2019-04-22 ENCOUNTER — Encounter (HOSPITAL_COMMUNITY): Payer: Self-pay | Admitting: Psychiatry

## 2019-04-22 ENCOUNTER — Ambulatory Visit (INDEPENDENT_AMBULATORY_CARE_PROVIDER_SITE_OTHER): Payer: Medicare Other | Admitting: Psychiatry

## 2019-04-22 ENCOUNTER — Other Ambulatory Visit: Payer: Self-pay

## 2019-04-22 DIAGNOSIS — F4310 Post-traumatic stress disorder, unspecified: Secondary | ICD-10-CM | POA: Diagnosis not present

## 2019-04-22 DIAGNOSIS — F330 Major depressive disorder, recurrent, mild: Secondary | ICD-10-CM | POA: Diagnosis not present

## 2019-04-22 MED ORDER — LURASIDONE HCL 60 MG PO TABS: 60.0000 mg | ORAL_TABLET | Freq: Every day | ORAL | 1 refills | Status: DC

## 2019-04-22 MED ORDER — FLUOXETINE HCL 40 MG PO CAPS: 40.0000 mg | ORAL_CAPSULE | Freq: Every day | ORAL | 1 refills | Status: DC

## 2019-04-22 NOTE — Patient Instructions (Signed)
1. Continue fluoxetine 40 mg daily 2. ContinueLatuda 60 mg daily 3.Next appointment: in January

## 2019-05-06 ENCOUNTER — Encounter: Payer: Self-pay | Admitting: Internal Medicine

## 2019-06-10 DIAGNOSIS — Z23 Encounter for immunization: Secondary | ICD-10-CM | POA: Diagnosis not present

## 2019-07-01 ENCOUNTER — Ambulatory Visit (HOSPITAL_COMMUNITY): Payer: Medicare Other | Admitting: Psychiatry

## 2019-07-10 ENCOUNTER — Ambulatory Visit (HOSPITAL_COMMUNITY): Payer: Medicare Other | Admitting: Psychiatry

## 2019-07-15 ENCOUNTER — Other Ambulatory Visit: Payer: Self-pay

## 2019-07-15 ENCOUNTER — Ambulatory Visit: Payer: Medicare Other | Admitting: Psychiatry

## 2019-07-29 DIAGNOSIS — R05 Cough: Secondary | ICD-10-CM | POA: Diagnosis not present

## 2019-07-29 DIAGNOSIS — R490 Dysphonia: Secondary | ICD-10-CM | POA: Diagnosis not present

## 2019-08-12 DIAGNOSIS — J9801 Acute bronchospasm: Secondary | ICD-10-CM | POA: Diagnosis not present

## 2019-08-12 DIAGNOSIS — I10 Essential (primary) hypertension: Secondary | ICD-10-CM | POA: Diagnosis not present

## 2019-09-02 DIAGNOSIS — I10 Essential (primary) hypertension: Secondary | ICD-10-CM | POA: Diagnosis not present

## 2019-09-02 DIAGNOSIS — G4700 Insomnia, unspecified: Secondary | ICD-10-CM | POA: Diagnosis not present

## 2019-09-02 DIAGNOSIS — Z79899 Other long term (current) drug therapy: Secondary | ICD-10-CM | POA: Diagnosis not present

## 2019-09-08 ENCOUNTER — Ambulatory Visit (INDEPENDENT_AMBULATORY_CARE_PROVIDER_SITE_OTHER): Payer: Medicare Other | Admitting: Psychiatry

## 2019-09-08 ENCOUNTER — Encounter (HOSPITAL_COMMUNITY): Payer: Self-pay | Admitting: Psychiatry

## 2019-09-08 ENCOUNTER — Other Ambulatory Visit: Payer: Self-pay

## 2019-09-08 DIAGNOSIS — F331 Major depressive disorder, recurrent, moderate: Secondary | ICD-10-CM | POA: Diagnosis not present

## 2019-09-08 MED ORDER — LURASIDONE HCL 80 MG PO TABS: 80.0000 mg | ORAL_TABLET | Freq: Every day | ORAL | 1 refills | Status: DC
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
## 2019-09-08 NOTE — Patient Instructions (Addendum)
1. Continue fluoxetine 40 mg daily 2. IncreaseLatuda 80 mg daily 3.Next appointment: 5/18 at 1:20  CONTACT INFORMATION  What to do if you need to get in touch with someone regarding a psychiatric issue:  1. EMERGENCY: For psychiatric emergencies (if you are suicidal or if there are any other safety issues) call 911 and/or go to your nearest Emergency Room immediately.   2. IF YOU NEED SOMEONE TO TALK TO RIGHT NOW: Given my clinical responsibilities, I may not be able to speak with you over the phone for a prolonged period of time.  a. You may always call The National Suicide Prevention Lifeline at 1-800-273-TALK 470-694-9157).  b. Your county of residence will also have local crisis services. For [facility]: [place] at [phone] ([place])
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
## 2019-11-04 ENCOUNTER — Other Ambulatory Visit: Payer: Self-pay

## 2019-11-04 ENCOUNTER — Encounter (HOSPITAL_COMMUNITY): Payer: Self-pay | Admitting: Psychiatry

## 2019-11-04 ENCOUNTER — Telehealth (INDEPENDENT_AMBULATORY_CARE_PROVIDER_SITE_OTHER): Payer: Medicare Other | Admitting: Psychiatry

## 2019-11-04 DIAGNOSIS — F3341 Major depressive disorder, recurrent, in partial remission: Secondary | ICD-10-CM

## 2019-11-04 MED ORDER — FLUOXETINE HCL 40 MG PO CAPS: 40.0000 mg | ORAL_CAPSULE | Freq: Every day | ORAL | 0 refills | Status: DC

## 2019-11-04 MED ORDER — LURASIDONE HCL 80 MG PO TABS: 80.0000 mg | ORAL_TABLET | Freq: Every day | ORAL | 0 refills | Status: DC
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
## 2020-01-26 ENCOUNTER — Other Ambulatory Visit: Payer: Self-pay

## 2020-01-26 ENCOUNTER — Telehealth (INDEPENDENT_AMBULATORY_CARE_PROVIDER_SITE_OTHER): Payer: Medicare Other | Admitting: Psychiatry

## 2020-01-26 ENCOUNTER — Encounter (HOSPITAL_COMMUNITY): Payer: Self-pay | Admitting: Psychiatry

## 2020-01-26 DIAGNOSIS — F330 Major depressive disorder, recurrent, mild: Secondary | ICD-10-CM | POA: Diagnosis not present

## 2020-01-26 MED ORDER — ZIPRASIDONE HCL 20 MG PO CAPS: 20.0000 mg | ORAL_CAPSULE | Freq: Two times a day (BID) | ORAL | 1 refills | Status: DC

## 2020-01-26 MED ORDER — FLUOXETINE HCL 40 MG PO CAPS: 40.0000 mg | ORAL_CAPSULE | Freq: Every day | ORAL | 0 refills | Status: DC

## 2020-01-26 NOTE — Patient Instructions (Signed)
1. Continue fluoxetine 40 mg daily 2. Discontinue Latuda 3. Start geodone 20 mg twice a day   4.Next appointment:10/4 at 1:20
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
## 2020-03-22 ENCOUNTER — Encounter (HOSPITAL_COMMUNITY): Payer: Self-pay | Admitting: Psychiatry

## 2020-03-22 ENCOUNTER — Telehealth (INDEPENDENT_AMBULATORY_CARE_PROVIDER_SITE_OTHER): Payer: Medicare Other | Admitting: Psychiatry

## 2020-03-22 ENCOUNTER — Other Ambulatory Visit: Payer: Self-pay

## 2020-03-22 DIAGNOSIS — F330 Major depressive disorder, recurrent, mild: Secondary | ICD-10-CM | POA: Diagnosis not present

## 2020-03-22 MED ORDER — ZIPRASIDONE HCL 20 MG PO CAPS: 20.0000 mg | ORAL_CAPSULE | Freq: Two times a day (BID) | ORAL | 0 refills | Status: DC

## 2020-03-22 MED ORDER — FLUOXETINE HCL 40 MG PO CAPS: 40.0000 mg | ORAL_CAPSULE | Freq: Every day | ORAL | 0 refills | Status: DC
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
## 2020-06-28 ENCOUNTER — Telehealth (HOSPITAL_COMMUNITY): Payer: Medicare Other | Admitting: Psychiatry

## 2020-06-28 ENCOUNTER — Other Ambulatory Visit: Payer: Self-pay

## 2020-06-28 ENCOUNTER — Telehealth (INDEPENDENT_AMBULATORY_CARE_PROVIDER_SITE_OTHER): Payer: Medicaid Other | Admitting: Psychiatry

## 2020-06-28 ENCOUNTER — Encounter: Payer: Self-pay | Admitting: Psychiatry

## 2020-06-28 DIAGNOSIS — F3341 Major depressive disorder, recurrent, in partial remission: Secondary | ICD-10-CM | POA: Diagnosis not present

## 2020-06-28 MED ORDER — FLUOXETINE HCL 40 MG PO CAPS: 40.0000 mg | ORAL_CAPSULE | Freq: Every day | ORAL | 0 refills | Status: DC

## 2020-06-28 MED ORDER — ZIPRASIDONE HCL 20 MG PO CAPS: 20.0000 mg | ORAL_CAPSULE | Freq: Two times a day (BID) | ORAL | 0 refills | Status: DC

## 2020-06-29 ENCOUNTER — Telehealth (HOSPITAL_COMMUNITY): Payer: Self-pay | Admitting: *Deleted

## 2020-06-29 ENCOUNTER — Telehealth: Payer: Self-pay | Admitting: *Deleted

## 2020-06-29 NOTE — Telephone Encounter (Signed)
Contacted nurse at provider's office and was informed with status and patient is aware and verbalized understanding.

## 2020-06-29 NOTE — Telephone Encounter (Signed)
Patient called and said she could not get her Geodon when she went to the pharmacy. Placed call to pharmacy.  Pharmacy stated that she did run out of [place] because last script only lasted till 1/4.  It was noted in the chart that Geodon was not to be filled until 2/4. Please send script to cover this month to the pharmacy after review.  Thanks

## 2020-06-29 NOTE — Telephone Encounter (Signed)
Called to pharmacy.  Pharmacy to call patient when ready.

## 2020-06-29 NOTE — Telephone Encounter (Signed)
Sorry, it was my error. Could you contact the pharmacy and fill Geodon today (and not wait till 2/4).

## 2020-06-29 NOTE — Telephone Encounter (Signed)
patient calling stating the Geodon that was sent to the pharmacy yesterday was sent for the pharmacy to not fill until 07-23-2020. Per pt she do not have any script to take today and she don't know what to do. Per pt she said she just spoke with provider yesterday for a visit. Pt would like for you to please send in her Geodon for this month

## 2020-08-12 ENCOUNTER — Other Ambulatory Visit (HOSPITAL_COMMUNITY): Payer: Self-pay | Admitting: Radiology

## 2020-08-12 DIAGNOSIS — F17210 Nicotine dependence, cigarettes, uncomplicated: Secondary | ICD-10-CM

## 2020-08-12 DIAGNOSIS — R0600 Dyspnea, unspecified: Secondary | ICD-10-CM
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
## 2020-08-30 ENCOUNTER — Other Ambulatory Visit: Payer: Self-pay

## 2020-08-30 ENCOUNTER — Other Ambulatory Visit (HOSPITAL_COMMUNITY)
Admission: RE | Admit: 2020-08-30 | Discharge: 2020-08-30 | Disposition: A | Discharge status: Home / Self Care | Payer: 59 | Source: Ambulatory Visit | Attending: Internal Medicine | Admitting: Internal Medicine

## 2020-08-30 ENCOUNTER — Encounter: Payer: Self-pay | Admitting: Psychiatry

## 2020-08-30 ENCOUNTER — Telehealth (INDEPENDENT_AMBULATORY_CARE_PROVIDER_SITE_OTHER): Payer: 59 | Admitting: Psychiatry

## 2020-08-30 DIAGNOSIS — Z01812 Encounter for preprocedural laboratory examination: Secondary | ICD-10-CM | POA: Insufficient documentation

## 2020-08-30 DIAGNOSIS — F330 Major depressive disorder, recurrent, mild: Secondary | ICD-10-CM

## 2020-08-30 DIAGNOSIS — Z20822 Contact with and (suspected) exposure to covid-19: Secondary | ICD-10-CM | POA: Insufficient documentation

## 2020-08-30 LAB — SARS CORONAVIRUS 2 (TAT 6-24 HRS): SARS Coronavirus 2: NEGATIVE

## 2020-09-02 ENCOUNTER — Other Ambulatory Visit: Payer: Self-pay

## 2020-09-02 ENCOUNTER — Ambulatory Visit (HOSPITAL_COMMUNITY)
Admission: RE | Admit: 2020-09-02 | Discharge: 2020-09-02 | Disposition: A | Discharge status: Home / Self Care | Payer: 59 | Source: Ambulatory Visit | Attending: Internal Medicine | Admitting: Internal Medicine

## 2020-09-02 DIAGNOSIS — R0600 Dyspnea, unspecified: Secondary | ICD-10-CM | POA: Diagnosis present

## 2020-09-02 DIAGNOSIS — F17210 Nicotine dependence, cigarettes, uncomplicated: Secondary | ICD-10-CM | POA: Insufficient documentation

## 2020-09-02 LAB — PULMONARY FUNCTION TEST
DL/VA % pred: 113 %
DL/VA: 4.74 ml/min/mmHg/L
DLCO unc % pred: 88 %
DLCO unc: 17.72 ml/min/mmHg
FEF 25-75 Post: 2.12 L/sec
FEF 25-75 Pre: 2.11 L/sec
FEF2575-%Change-Post: 0 %
FEF2575-%Pred-Post: 106 %
FEF2575-%Pred-Pre: 105 %
FEV1-%Change-Post: 2 %
FEV1-%Pred-Post: 94 %
FEV1-%Pred-Pre: 92 %
FEV1-Post: 1.92 L
FEV1-Pre: 1.88 L
FEV1FVC-%Change-Post: 4 %
FEV1FVC-%Pred-Pre: 101 %
FEV6-%Change-Post: -2 %
FEV6-%Pred-Post: 90 %
FEV6-%Pred-Pre: 92 %
FEV6-Post: 2.27 L
FEV6-Pre: 2.34 L
FEV6FVC-%Pred-Post: 103 %
FEV6FVC-%Pred-Pre: 103 %
FVC-%Change-Post: -2 %
FVC-%Pred-Post: 87 %
FVC-%Pred-Pre: 89 %
FVC-Post: 2.27 L
FVC-Pre: 2.34 L
Post FEV1/FVC ratio: 84 %
Post FEV6/FVC ratio: 100 %
Pre FEV1/FVC ratio: 80 %
Pre FEV6/FVC Ratio: 100 %
RV % pred: 86 %
RV: 1.76 L
TLC % pred: 82 %
TLC: 4.15 L

## 2020-09-02 MED ORDER — ALBUTEROL SULFATE (2.5 MG/3ML) 0.083% IN NEBU
2.5000 mg | INHALATION_SOLUTION | Freq: Once | RESPIRATORY_TRACT | Status: AC
  Administered 2020-09-02: 2.5 mg via RESPIRATORY_TRACT

## 2020-09-29 ENCOUNTER — Telehealth: Payer: Self-pay

## 2020-09-29 NOTE — Telephone Encounter (Signed)
pt called states she is out of he medications and she needs a refill today

## 2020-09-29 NOTE — Telephone Encounter (Signed)
Could you verify with the pharmacy- she should have enough meds until the next visit.

## 2020-09-30 ENCOUNTER — Other Ambulatory Visit: Payer: Self-pay | Admitting: Psychiatry

## 2020-09-30 DIAGNOSIS — F321 Major depressive disorder, single episode, moderate: Secondary | ICD-10-CM

## 2020-09-30 MED ORDER — ZIPRASIDONE HCL 20 MG PO CAPS: 20.0000 mg | ORAL_CAPSULE | Freq: Two times a day (BID) | ORAL | 0 refills | Status: DC

## 2020-09-30 NOTE — Telephone Encounter (Signed)
Ordered. Please check with her to make sure the dose she takes- 20 mg twice a day. Please also remind her to get EKG at her PCP office.

## 2020-09-30 NOTE — Telephone Encounter (Signed)
pt called states she been out of medication for a couple of days. she states she got her prescribion on 06-29-20

## 2020-09-30 NOTE — Telephone Encounter (Signed)
EKG ordered

## 2020-09-30 NOTE — Telephone Encounter (Signed)
call pharmacy according to pharmacy they did not hold the rx they went ahead and filled medications because pt was out of medications.

## 2020-09-30 NOTE — Telephone Encounter (Signed)
called Ashley Patrick and told her that rx was sent to pharmacy and that she needed to get her EKG done. Ashley Patrick states her primary doctor wouldnt do it . i told Ashley Patrick that i would fax order to the [facility] and for her to call [place] registration [phone] to make an appt to have EKG done.

## 2020-10-01 ENCOUNTER — Ambulatory Visit
Admission: RE | Admit: 2020-10-01 | Discharge: 2020-10-01 | Disposition: A | Discharge status: Home / Self Care | Payer: 59 | Source: Ambulatory Visit | Attending: Internal Medicine | Admitting: Internal Medicine

## 2020-10-01 ENCOUNTER — Other Ambulatory Visit: Payer: Self-pay | Admitting: Psychiatry

## 2020-10-01 DIAGNOSIS — F330 Major depressive disorder, recurrent, mild: Secondary | ICD-10-CM

## 2020-10-01 DIAGNOSIS — Z5181 Encounter for therapeutic drug level monitoring: Secondary | ICD-10-CM | POA: Diagnosis not present

## 2020-10-01 DIAGNOSIS — F332 Major depressive disorder, recurrent severe without psychotic features: Secondary | ICD-10-CM | POA: Diagnosis present
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
## 2020-10-18 ENCOUNTER — Ambulatory Visit: Payer: 59 | Admitting: Psychiatry

## 2020-10-22 ENCOUNTER — Emergency Department (HOSPITAL_COMMUNITY): Payer: Medicare HMO

## 2020-10-22 ENCOUNTER — Encounter (HOSPITAL_COMMUNITY): Payer: Self-pay

## 2020-10-22 ENCOUNTER — Other Ambulatory Visit: Payer: Self-pay

## 2020-10-22 ENCOUNTER — Emergency Department (HOSPITAL_COMMUNITY)
Admission: EM | Admit: 2020-10-22 | Discharge: 2020-10-22 | Disposition: A | Discharge status: Home / Self Care | Payer: Medicare HMO | Attending: Emergency Medicine | Admitting: Emergency Medicine

## 2020-10-22 DIAGNOSIS — Z85038 Personal history of other malignant neoplasm of large intestine: Secondary | ICD-10-CM | POA: Diagnosis not present

## 2020-10-22 DIAGNOSIS — R197 Diarrhea, unspecified: Secondary | ICD-10-CM | POA: Diagnosis not present

## 2020-10-22 DIAGNOSIS — R0682 Tachypnea, not elsewhere classified: Secondary | ICD-10-CM | POA: Diagnosis not present

## 2020-10-22 DIAGNOSIS — R140 Abdominal distension (gaseous): Secondary | ICD-10-CM | POA: Insufficient documentation

## 2020-10-22 DIAGNOSIS — K5901 Slow transit constipation: Secondary | ICD-10-CM | POA: Insufficient documentation

## 2020-10-22 DIAGNOSIS — Z79899 Other long term (current) drug therapy: Secondary | ICD-10-CM | POA: Diagnosis not present

## 2020-10-22 DIAGNOSIS — R1084 Generalized abdominal pain: Secondary | ICD-10-CM | POA: Diagnosis present

## 2020-10-22 DIAGNOSIS — F17210 Nicotine dependence, cigarettes, uncomplicated: Secondary | ICD-10-CM | POA: Insufficient documentation

## 2020-10-22 DIAGNOSIS — I10 Essential (primary) hypertension: Secondary | ICD-10-CM | POA: Insufficient documentation

## 2020-10-22 LAB — CBC WITH DIFFERENTIAL/PLATELET
Abs Immature Granulocytes: 0.01 10*3/uL (ref 0.00–0.07)
Basophils Absolute: 0 10*3/uL (ref 0.0–0.1)
Basophils Relative: 0 %
Eosinophils Absolute: 0.2 10*3/uL (ref 0.0–0.5)
Eosinophils Relative: 3 %
HCT: 37.1 % (ref 36.0–46.0)
Hemoglobin: 13 g/dL (ref 12.0–15.0)
Immature Granulocytes: 0 %
Lymphocytes Relative: 56 %
Lymphs Abs: 3.4 10*3/uL (ref 0.7–4.0)
MCH: 30.7 pg (ref 26.0–34.0)
MCHC: 35 g/dL (ref 30.0–36.0)
MCV: 87.5 fL (ref 80.0–100.0)
Monocytes Absolute: 0.3 10*3/uL (ref 0.1–1.0)
Monocytes Relative: 5 %
Neutro Abs: 2.2 10*3/uL (ref 1.7–7.7)
Neutrophils Relative %: 36 %
Platelets: 223 10*3/uL (ref 150–400)
RBC: 4.24 MIL/uL (ref 3.87–5.11)
RDW: 13.5 % (ref 11.5–15.5)
WBC: 6.2 10*3/uL (ref 4.0–10.5)
nRBC: 0 % (ref 0.0–0.2)

## 2020-10-22 LAB — COMPREHENSIVE METABOLIC PANEL
ALT: 16 U/L (ref 0–44)
AST: 18 U/L (ref 15–41)
Albumin: 4.5 g/dL (ref 3.5–5.0)
Alkaline Phosphatase: 69 U/L (ref 38–126)
Anion gap: 8 (ref 5–15)
BUN: 19 mg/dL (ref 8–23)
CO2: 24 mmol/L (ref 22–32)
Calcium: 9.8 mg/dL (ref 8.9–10.3)
Chloride: 101 mmol/L (ref 98–111)
Creatinine, Ser: 1.3 mg/dL — ABNORMAL HIGH (ref 0.44–1.00)
GFR, Estimated: 46 mL/min — ABNORMAL LOW (ref >60)
Glucose, Bld: 89 mg/dL (ref 70–99)
Potassium: 3.7 mmol/L (ref 3.5–5.1)
Sodium: 133 mmol/L — ABNORMAL LOW (ref 135–145)
Total Bilirubin: 0.4 mg/dL (ref 0.3–1.2)
Total Protein: 8.1 g/dL (ref 6.5–8.1)

## 2020-10-22 LAB — LIPASE, BLOOD: Lipase: 28 U/L (ref 11–51)

## 2020-10-22 LAB — LACTIC ACID, PLASMA: Lactic Acid, Venous: 2.2 mmol/L (ref 0.5–1.9)

## 2020-10-22 MED ORDER — ONDANSETRON HCL 4 MG/2ML IJ SOLN
4.0000 mg | INTRAMUSCULAR | Status: AC
  Administered 2020-10-22: 4 mg via INTRAVENOUS
  Filled 2020-10-22: qty 2

## 2020-10-22 MED ORDER — HYDROMORPHONE HCL 1 MG/ML IJ SOLN
1.0000 mg | Freq: Once | INTRAMUSCULAR | Status: AC
  Administered 2020-10-22: 1 mg via INTRAVENOUS
  Filled 2020-10-22: qty 1

## 2020-10-22 MED ORDER — FLEET ENEMA 7-19 GM/118ML RE ENEM
1.0000 | ENEMA | Freq: Once | RECTAL | Status: AC
  Administered 2020-10-22: 1 via RECTAL

## 2020-10-22 MED ORDER — SODIUM CHLORIDE 0.9 % IV SOLN: INTRAVENOUS | Status: DC

## 2020-10-22 MED ORDER — IOHEXOL 300 MG/ML  SOLN
100.0000 mL | Freq: Once | INTRAMUSCULAR | Status: AC | PRN
  Administered 2020-10-22: 100 mL via INTRAVENOUS

## 2020-10-22 NOTE — ED Notes (Signed)
Pt back from ct

## 2020-10-22 NOTE — Discharge Instructions (Addendum)
Please take MiraLAX, 1 scoop 3 times a day until you are having regular soft or loose bowel movements.  Your CT scan and blood work were reassuring and showed that the only problem that was visually evident was that you have severe constipation from a lot of stool.  If you should develop severe or worsening symptoms please return to the emergency department, allow yourself to take this medication for a couple of days and understand that taking this medication will cause cramping, that is part of the treatment plan with this medication.  That is how it works.  You should follow-up with a gastroenterologist, I have given you the phone number for the on-call provider today, call for the next available appointment.

## 2020-10-22 NOTE — ED Notes (Signed)
Patient transported to CT 

## 2020-10-22 NOTE — ED Triage Notes (Signed)
Patient complains of abdominal pain for 2 weeks increase severity today. Also complains of abdominal distention. Complain of thick bm x 7 gravy in consistency.

## 2020-10-22 NOTE — ED Notes (Signed)
Date and time results received: 10/22/20 2020 (use smartphrase ".now" to insert current time)  Test: Lactic Critical Value: 2.2  Name of Provider Notified: :Noemi Chapel MD  Orders Received? Or Actions Taken?: acknowleged

## 2020-10-22 NOTE — ED Provider Notes (Signed)
[facility] EMERGENCY DEPARTMENT Provider Note   CSN: [id number] Arrival date & time: 10/22/20  1904     History Chief Complaint  Patient presents with  . Abdominal Pain    Ashley Patrick is a 65 y.o. female.  HPI    This patient is a 65 year old female, she does have a prior history of colon cancer, she has a history of headaches, no history of diabetes but does have a history of hypertension and hypercholesterolemia.  She presents to the hospital today with a complaint of abdominal pain and diarrhea, the diarrhea has been going on for several days the abdominal pain is gradually worsening and today it is severe, diffuse and associated with distention and bloating of the abdomen.  This has not prevented her from eating and today she had some chicken, collard greens and cornbread but because of the increased amount of liquid stool which she states looks like gravy and sinks to the bottom of the commode and the increasing pain she came to the hospital for evaluation.  She has a history of a hysterectomy and a laparoscopic assisted partial colectomy but no other abdominal surgery that she reports  Past Medical History:  Diagnosis Date  . Back pain   . Cancer [facility]) 2014   Colon Cancer  . Hypercholesteremia   . Hypertension   . Lateral epicondylitis  of elbow    right  . Migraine headache   . Nicotine addiction   . Obesity    History of  . OD (overdose of drug)    hospitalized for 11 days   . Psychosis [facility])     Patient Active Problem List   Diagnosis Date Noted  . Well woman exam with routine gynecological exam 01/29/2017  . MDD (major depressive disorder), recurrent, severe, with psychosis ([place]) 12/30/2016  . Tobacco abuse counseling 09/13/2016  . Abnormal CT scan, colon 05/02/2016  . GERD (gastroesophageal reflux disease) 05/02/2016  . Hiatal hernia   . History of colonic polyps   . Diverticulosis of colon without hemorrhage   . Constipation 07/22/2014  . History of  colon cancer 07/20/2014  . Dysphagia 07/20/2014  . Lower abdominal pain 09/30/2013  . Anxiety, generalized 08/08/2013  . 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 Noncompliance with medications 08/07/2013  . Colon cancer ([place]) 06/23/2013  . Rectal bleeding 05/09/2013  . Gastritis 05/09/2013  . LLQ pain 11/28/2012  . Hemorrhagic cyst of ovary 11/28/2012  . SHOULDER PAIN, LEFT 05/18/2010  . ACUTE BRONCHITIS 09/03/2008  . UNSPECIFIED URINARY INCONTINENCE 09/03/2008  . ACUTE CYSTITIS 07/19/2008  . Abdominal pain, generalized 07/09/2008  . OBESITY 11/22/2007  . UNSPECIFIED PSYCHOSIS 11/22/2007  . MIGRAINE HEADACHE 11/22/2007  . HYPERTENSION 11/22/2007  . BACK PAIN 11/22/2007  . LATERAL EPICONDYLITIS OF ELBOW 11/22/2007    Past Surgical History:  Procedure Laterality Date  . ABDOMINAL HYSTERECTOMY    . BIOPSY N/A 08/13/2014   Procedure: BIOPSY;  Surgeon: Daneil Dolin, MD;  Location: [facility] ORS;  Service: Endoscopy;  Laterality: N/A;  . BREAST BIOPSY  10/18/2011   Procedure: BREAST BIOPSY;  Surgeon: Jamesetta So, MD;  Location: [facility] ORS;  Service: General;  Laterality: Left;  . COLON RESECTION N/A 06/23/2013   Procedure: HAND ASSISTED LAPAROSCOPIC PARTIAL COLECTOMY;  Surgeon: Jamesetta So, MD;  Location: [facility] ORS;  Service: General;  Laterality: N/A;  . COLONOSCOPY    . COLONOSCOPY N/A 05/29/2013   [id number] mass most likely representing colorectal cancer S/ P  biospy.Multiple colonic and rectal polyps removed/treated as described above. Colonic diverticulosis  . COLONOSCOPY WITH PROPOFOL N/A 08/13/2014   RMR: Status post sigmoid colectomy. Multiple colonic polyps removed as described above. Redundant colon. Pan colonic diverticuloisi  . COLONOSCOPY WITH PROPOFOL N/A 05/22/2016   Procedure: COLONOSCOPY WITH PROPOFOL;  Surgeon: Daneil Dolin, MD;  Location: [facility] ENDO SUITE;  Service: Endoscopy;  Laterality: N/A;  7:30 am  . ESOPHAGEAL DILATION N/A 08/13/2014    Procedure: [place];  Surgeon: Daneil Dolin, MD;  Location: [facility] ORS;  Service: Endoscopy;  Laterality: N/A;  . ESOPHAGOGASTRODUODENOSCOPY N/A 12/26/2012   OYD:XAJOINO reflux esophagitis. Gastric and duodenal bulbar erosions-status post gastric biopsynegative H.pylori  . ESOPHAGOGASTRODUODENOSCOPY (EGD) WITH PROPOFOL N/A 08/13/2014   RMR: Small benign cystic-appearing lesion in distal esophagus of doubtful clinical significance, otherwise normal esophagus, status post Maloney dilation. Small hiatal hernia, some retained gastric contents (query delayed gastric emptying.)  . partial hysterectomy    . POLYPECTOMY N/A 08/13/2014   Procedure: POLYPECTOMY;  Surgeon: Daneil Dolin, MD;  Location: [facility] ORS;  Service: Endoscopy;  Laterality: N/A;     OB History    Gravida  4   Para  3   Term  3   Preterm      AB  1   Living  3     SAB      IAB  1   Ectopic      Multiple      Live Births  3           Family History  Problem Relation Age of Onset  . Drug abuse Mother   . Colon cancer Father        diagnosed in his 50s  . Drug abuse Father   . HIV/AIDS Brother   . Anesthesia problems Neg Hx   . Hypotension Neg Hx   . Malignant hyperthermia Neg Hx   . Pseudochol deficiency Neg Hx     Social History   Tobacco Use  . Smoking status: Current Every Day Smoker    Packs/day: 0.50    Years: 38.00    Pack years: 19.00    Types: Cigarettes    Last attempt to quit: 06/19/2014    Years since quitting: 6.3  . Smokeless tobacco: Never Used  . Tobacco comment: vapor only  Vaping Use  . Vaping Use: Never used  Substance Use Topics  . Alcohol use: No    Alcohol/week: 0.0 standard drinks  . Drug use: No    Home Medications Prior to Admission medications   Medication Sig Start Date End Date Taking? Authorizing Provider  acetaminophen (TYLENOL) 650 MG CR tablet Take 650 mg by mouth every 8 (eight) hours as needed for pain.   Yes [provider]  albuterol (VENTOLIN HFA) 108 (90 Base) MCG/ACT inhaler Inhale 2 puffs into the lungs every 4 (four) hours as needed. 07/21/20  Yes [provider]  Cyanocobalamin ([employer] VITAMIN B-12) 5000 MCG SUBL Place 1 tablet under the tongue daily.   Yes [provider]  FLUoxetine (PROZAC) 40 MG capsule Take 1 capsule (40 mg total) by mouth daily. 07/26/20  Yes Hisada, Elie Goody, MD  hydrochlorothiazide (HYDRODIURIL) 25 MG tablet Take 1 tablet by mouth daily. 08/23/20  Yes [provider]  hydrOXYzine (ATARAX/VISTARIL) 25 MG tablet Take 25 mg by mouth every 8 (eight) hours. 09/08/20  Yes [provider]  IBU 800 MG tablet Take 1 tablet by mouth every 6 (  six) hours as needed for mild pain or moderate pain.  12/14/17  Yes [provider]  loratadine (CLARITIN) 10 MG tablet Take 10 mg by mouth daily. 08/05/20  Yes [provider]  OLMESARTAN MEDOXOMIL PO Take 10 mg by mouth daily.   Yes [provider]  omeprazole (PRILOSEC) 20 MG capsule Take 1 capsule by mouth daily. 08/25/20  Yes [provider]  oxybutynin (DITROPAN) 5 MG tablet Take 5 mg by mouth 2 (two) times daily. 07/18/20  Yes [provider]  traZODone (DESYREL) 150 MG tablet Take 300 mg by mouth at bedtime. 10/14/20  Yes [provider]  zinc gluconate 50 MG tablet Take 50 mg by mouth daily.   Yes [provider]  ziprasidone (GEODON) 20 MG capsule Take 1 capsule (20 mg total) by mouth 2 (two) times daily with a meal. 09/30/20 12/29/20 Yes Hisada, Elie Goody, MD  Ascorbic Acid (VITAMIN C) 250 MG CHEW Chew 1 tablet by mouth daily.  Patient not taking: No sig reported    [provider]  benazepril (LOTENSIN) 20 MG tablet Take 20 mg by mouth daily.  Patient not taking: No sig reported 11/20/17   [provider]  Cholecalciferol (VITAMIN D) 2000 units CAPS Take 2,000 Units by mouth daily.     [provider]  olmesartan (BENICAR) 5 MG tablet Take 10 mg  by mouth daily. Patient not taking: No sig reported 09/06/20   [provider]  SPIRIVA RESPIMAT 1.25 MCG/ACT AERS Inhale 1 puff into the lungs daily. 10/18/20   [provider]  thiamine (VITAMIN B-1) 100 MG tablet Take 100 mg by mouth daily. Patient not taking: No sig reported    [provider]  VITAMIN A PO Take 1 tablet by mouth daily.  Patient not taking: No sig reported    [provider]  vitamin E 400 UNIT capsule Take 400 Units by mouth daily. Patient not taking: No sig reported    [provider]    Allergies    Patient has no known allergies.  Review of Systems   Review of Systems  All other systems reviewed and are negative.   Physical Exam Updated Vital Signs BP 117/86 (BP Location: Left Arm)   Pulse 84   Temp 98.1 F (36.7 C) (Oral)   Resp (!) 26   Ht 1.626 m (5\' 4" )   Wt 87.5 kg   SpO2 100%   BMI 33.13 kg/m   Physical Exam Vitals and nursing note reviewed.  Constitutional:      General: She is not in acute distress.    Appearance: She is well-developed.  HENT:     Head: Normocephalic and atraumatic.     Mouth/Throat:     Pharynx: No oropharyngeal exudate.  Eyes:     General: No scleral icterus.       Right eye: No discharge.        Left eye: No discharge.     Conjunctiva/sclera: Conjunctivae normal.     Pupils: Pupils are equal, round, and reactive to light.  Neck:     Thyroid: No thyromegaly.     Vascular: No JVD.  Cardiovascular:     Rate and Rhythm: Normal rate and regular rhythm.     Heart sounds: Normal heart sounds. No murmur heard. No friction rub. No gallop.   Pulmonary:     Breath sounds: Normal breath sounds. No wheezing, rhonchi or rales.     Comments: Mild tachypnea Abdominal:  General: There is distension.     Palpations: Abdomen is soft. There is no mass.     Tenderness: There is abdominal tenderness.     Comments: MyDiffuse mild tenderness to palpation, no guarding or peritoneal  signs, tympanitic sounds to percussion, increasing bowel sounds  Musculoskeletal:        General: No tenderness. Normal range of motion.     Cervical back: Normal range of motion and neck supple.     Right lower leg: No edema.     Left lower leg: No edema.  Lymphadenopathy:     Cervical: No cervical adenopathy.  Skin:    General: Skin is warm and dry.     Findings: No erythema or rash.  Neurological:     Mental Status: She is alert.     Coordination: Coordination normal.  Psychiatric:        Behavior: Behavior normal.     ED Results / Procedures / Treatments   Labs (all labs ordered are listed, but only abnormal results are displayed) Labs Reviewed  COMPREHENSIVE METABOLIC PANEL - Abnormal; Notable for the following components:      Result Value   Sodium 133 (*)    Creatinine, Ser 1.30 (*)    GFR, Estimated 46 (*)    All other components within normal limits  LACTIC ACID, PLASMA - Abnormal; Notable for the following components:   Lactic Acid, Venous 2.2 (*)    All other components within normal limits  C DIFFICILE QUICK SCREEN W PCR REFLEX  CBC WITH DIFFERENTIAL/PLATELET  LIPASE, BLOOD    EKG None  Radiology CT ABDOMEN PELVIS W CONTRAST  Result Date: 10/22/2020 CLINICAL DATA:  Acute nonlocalized abdominal pain for 2 weeks. Worsening today. EXAM: CT ABDOMEN AND PELVIS WITH CONTRAST TECHNIQUE: Multidetector CT imaging of the abdomen and pelvis was performed using the standard protocol following bolus administration of intravenous contrast. CONTRAST:  148mL OMNIPAQUE IOHEXOL 300 MG/ML  SOLN COMPARISON:  CT 09/13/2018. FINDINGS: Lower chest: Clear lung bases. No significant pleural or pericardial effusion. There is atherosclerosis of the aorta and coronary arteries. Hepatobiliary: Several hepatic cysts are similar to the previous study. No new or enlarging hepatic lesions identified. No evidence of gallstones, gallbladder wall thickening or biliary dilatation. Pancreas: Stable  without acute findings. Possible pancreas divisum. No surrounding inflammatory changes. Spleen: Normal in size without focal abnormality. Adrenals/Urinary Tract: Both adrenal glands appear normal. Similar appearing small renal cysts bilaterally. No evidence of urinary tract calculus or hydronephrosis. The bladder appears normal. Stomach/Bowel: No enteric contrast was administered. The stomach appears unremarkable for its degree of distension. No evidence of bowel wall thickening, distention or surrounding inflammatory change. The appendix appears normal. There is a moderate to large amount of stool throughout the colon with high density components in the sigmoid colon proximal to a rectosigmoid anastomosis. Vascular/Lymphatic: There are no enlarged abdominal or pelvic lymph nodes. Aortic and branch vessel atherosclerosis. No acute vascular findings. The portal, superior mesenteric and splenic veins are patent. Reproductive: Hysterectomy.  No adnexal mass. Other: No evidence of abdominal wall mass or hernia. No ascites. Postsurgical changes in the low anterior abdominal wall. Musculoskeletal: No acute or significant osseous findings. IMPRESSION: 1. No acute findings or clear explanation for the patient's symptoms. 2. There is a moderate to large amount of stool throughout the colon with high density components in the sigmoid colon proximal to the patient's anastomosis consistent with constipation. This may contribute to the patient's symptoms. 3. Grossly stable hepatic and renal  cysts. 4.  Aortic Atherosclerosis (ICD10-I70.0). Electronically Signed   By: Richardean Sale M.D.   On: 10/22/2020 20:52    Procedures Procedures   Medications Ordered in ED Medications  0.9 %  sodium chloride infusion ( Intravenous New Bag/Given 10/22/20 1926)  HYDROmorphone (DILAUDID) injection 1 mg (1 mg Intravenous Given 10/22/20 1928)  ondansetron (ZOFRAN) injection 4 mg (4 mg Intravenous Given 10/22/20 1929)  iohexol (OMNIPAQUE)  300 MG/ML solution 100 mL (100 mLs Intravenous Contrast Given 10/22/20 2025)  sodium phosphate (FLEET) 7-19 GM/118ML enema 1 enema (1 enema Rectal Given 10/22/20 2129)    ED Course  I have reviewed the triage vital signs and the nursing notes.  Pertinent labs & imaging results that were available during my care of the patient were reviewed by me and considered in my medical decision making (see chart for details).    MDM Rules/Calculators/A&P                          This patient has normal vital signs but appears uncomfortable, she is a little tachypneic, she has diffuse abdominal pain, she has had at least 7 or 8 bowel movements today, no recent antibiotics or travel, will check labs, CT scan, C. difficile, patient agreeable, IV fluids and pain medications ordered  Pt has consdtipation - no other CT findings Labs reassruing - still hurting - will do enema Pt states now that she did take a laxative b/c she has been progressively more constiapted over the last 2 weeks. No surgical findings.  The patient has not had a bowel movement, she is now requesting discharge, given that her CT scan and lab work is reassuring I think this is reasonable and she has been given the treatment plan for home.  She is agreeable   Final Clinical Impression(s) / ED Diagnoses Final diagnoses:  Slow transit constipation  Generalized abdominal pain    Rx / DC Orders ED Discharge Orders    None       Noemi Chapel, MD 10/22/20 2228

## 2020-10-23 ENCOUNTER — Encounter (HOSPITAL_COMMUNITY): Payer: Self-pay | Admitting: Emergency Medicine

## 2020-10-23 ENCOUNTER — Emergency Department (HOSPITAL_COMMUNITY): Payer: Medicare HMO

## 2020-10-23 ENCOUNTER — Other Ambulatory Visit: Payer: Self-pay

## 2020-10-23 ENCOUNTER — Emergency Department (HOSPITAL_COMMUNITY)
Admission: EM | Admit: 2020-10-23 | Discharge: 2020-10-23 | Disposition: A | Discharge status: Home / Self Care | Payer: Medicare HMO | Attending: Emergency Medicine | Admitting: Emergency Medicine

## 2020-10-23 DIAGNOSIS — Z79899 Other long term (current) drug therapy: Secondary | ICD-10-CM | POA: Insufficient documentation

## 2020-10-23 DIAGNOSIS — Z85038 Personal history of other malignant neoplasm of large intestine: Secondary | ICD-10-CM | POA: Diagnosis not present

## 2020-10-23 DIAGNOSIS — F17210 Nicotine dependence, cigarettes, uncomplicated: Secondary | ICD-10-CM | POA: Diagnosis not present

## 2020-10-23 DIAGNOSIS — I10 Essential (primary) hypertension: Secondary | ICD-10-CM | POA: Diagnosis not present

## 2020-10-23 DIAGNOSIS — K5900 Constipation, unspecified: Secondary | ICD-10-CM | POA: Insufficient documentation

## 2020-10-23 LAB — COMPREHENSIVE METABOLIC PANEL
ALT: 15 U/L (ref 0–44)
AST: 17 U/L (ref 15–41)
Albumin: 4.5 g/dL (ref 3.5–5.0)
Alkaline Phosphatase: 67 U/L (ref 38–126)
Anion gap: 7 (ref 5–15)
BUN: 14 mg/dL (ref 8–23)
CO2: 24 mmol/L (ref 22–32)
Calcium: 9.9 mg/dL (ref 8.9–10.3)
Chloride: 103 mmol/L (ref 98–111)
Creatinine, Ser: 0.98 mg/dL (ref 0.44–1.00)
GFR, Estimated: >60 mL/min (ref >60)
Glucose, Bld: 84 mg/dL (ref 70–99)
Potassium: 3.7 mmol/L (ref 3.5–5.1)
Sodium: 134 mmol/L — ABNORMAL LOW (ref 135–145)
Total Bilirubin: 0.5 mg/dL (ref 0.3–1.2)
Total Protein: 8.1 g/dL (ref 6.5–8.1)

## 2020-10-23 LAB — CBC WITH DIFFERENTIAL/PLATELET
Abs Immature Granulocytes: 0.01 10*3/uL (ref 0.00–0.07)
Basophils Absolute: 0 10*3/uL (ref 0.0–0.1)
Basophils Relative: 0 %
Eosinophils Absolute: 0.2 10*3/uL (ref 0.0–0.5)
Eosinophils Relative: 4 %
HCT: 36.6 % (ref 36.0–46.0)
Hemoglobin: 12.6 g/dL (ref 12.0–15.0)
Immature Granulocytes: 0 %
Lymphocytes Relative: 54 %
Lymphs Abs: 3.1 10*3/uL (ref 0.7–4.0)
MCH: 30.4 pg (ref 26.0–34.0)
MCHC: 34.4 g/dL (ref 30.0–36.0)
MCV: 88.4 fL (ref 80.0–100.0)
Monocytes Absolute: 0.4 10*3/uL (ref 0.1–1.0)
Monocytes Relative: 7 %
Neutro Abs: 2 10*3/uL (ref 1.7–7.7)
Neutrophils Relative %: 35 %
Platelets: 220 10*3/uL (ref 150–400)
RBC: 4.14 MIL/uL (ref 3.87–5.11)
RDW: 13.7 % (ref 11.5–15.5)
WBC: 5.8 10*3/uL (ref 4.0–10.5)
nRBC: 0 % (ref 0.0–0.2)

## 2020-10-23 LAB — LIPASE, BLOOD: Lipase: 27 U/L (ref 11–51)

## 2020-10-23 MED ORDER — FENTANYL CITRATE (PF) 100 MCG/2ML IJ SOLN
50.0000 ug | Freq: Once | INTRAMUSCULAR | Status: AC
Start: 2020-10-23 — End: 2020-10-23
  Administered 2020-10-23: 50 ug via INTRAVENOUS
  Filled 2020-10-23: qty 2

## 2020-10-23 MED ORDER — PEG 3350-KCL-NA BICARB-NACL 420 G PO SOLR
4000.0000 mL | Freq: Once | ORAL | Status: AC
  Administered 2020-10-23: 4000 mL via ORAL
  Filled 2020-10-23: qty 4000

## 2020-10-23 MED ORDER — ONDANSETRON HCL 4 MG/2ML IJ SOLN
4.0000 mg | Freq: Once | INTRAMUSCULAR | Status: AC
  Administered 2020-10-23: 4 mg via INTRAVENOUS
  Filled 2020-10-23: qty 2

## 2020-10-23 NOTE — ED Triage Notes (Signed)
Patient c/o constipation x2 weeks. Per patient has not had "even a small BM for 2 weeks." Patient reports taking prune juice, exlax, and mag citrate with no results. Patient states seen here in ER last night and had enema with no results. Per patient pain and pressure becoming unbearable. Denies any nausea or vomiting.

## 2020-10-23 NOTE — ED Notes (Signed)
Pt has had multiple large bowel movements. PA has been to bedside for f/u assessment. Pt reports some relief of pain at this time. abd less distended and rigid.

## 2020-10-23 NOTE — ED Provider Notes (Signed)
[facility] EMERGENCY DEPARTMENT Provider Note   CSN: [id number] Arrival date & time: 10/23/20  1710     History Chief Complaint  Patient presents with  . Constipation    Ashley Patrick is a 65 y.o. female with PMH/o HTN, Colon Cancer (2015), Migraine who presents for evaluation of constipation and abd pain.  She states that this has been an ongoing issue for about 2 weeks.  Patient was actually seen in the emergency department last night for diarrhea, abdominal pain.  She had noted at that time that her stools had the "consistency of gravy and floated to the bottom of the toilet."  Her work-up revealed constipation as seen by CT scan.  She was given an enema but then patient to left.  She comes back today stating that she has not had a bowel movement.  She states that at home, she has drank prune juice, mag citrate with no improvement.  She states her abdominal pain is getting worse.  He has not had any vomiting.  She denies any fevers, chest pain, urinary complaints.  She states she has had constipation issues for about 9 months.  She has not seen GI for evaluation of this.  The history is provided by the patient.       Past Medical History:  Diagnosis Date  . Back pain   . Cancer [facility]) 2014   Colon Cancer  . Hypercholesteremia   . Hypertension   . Lateral epicondylitis  of elbow    right  . Migraine headache   . Nicotine addiction   . Obesity    History of  . OD (overdose of drug)    hospitalized for 11 days   . Psychosis [facility])     Patient Active Problem List   Diagnosis Date Noted  . Well woman exam with routine gynecological exam 01/29/2017  . MDD (major depressive disorder), recurrent, severe, with psychosis ([place]) 12/30/2016  . Tobacco abuse counseling 09/13/2016  . Abnormal CT scan, colon 05/02/2016  . GERD (gastroesophageal reflux disease) 05/02/2016  . Hiatal hernia   . History of colonic polyps   . Diverticulosis of colon without hemorrhage   . Constipation  07/22/2014  . History of colon cancer 07/20/2014  . Dysphagia 07/20/2014  . Lower abdominal pain 09/30/2013  . Anxiety, generalized 08/08/2013  . 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 Noncompliance with medications 08/07/2013  . Colon cancer ([place]) 06/23/2013  . Rectal bleeding 05/09/2013  . Gastritis 05/09/2013  . LLQ pain 11/28/2012  . Hemorrhagic cyst of ovary 11/28/2012  . SHOULDER PAIN, LEFT 05/18/2010  . ACUTE BRONCHITIS 09/03/2008  . UNSPECIFIED URINARY INCONTINENCE 09/03/2008  . ACUTE CYSTITIS 07/19/2008  . Abdominal pain, generalized 07/09/2008  . OBESITY 11/22/2007  . UNSPECIFIED PSYCHOSIS 11/22/2007  . MIGRAINE HEADACHE 11/22/2007  . HYPERTENSION 11/22/2007  . BACK PAIN 11/22/2007  . LATERAL EPICONDYLITIS OF ELBOW 11/22/2007    Past Surgical History:  Procedure Laterality Date  . ABDOMINAL HYSTERECTOMY    . BIOPSY N/A 08/13/2014   Procedure: BIOPSY;  Surgeon: Daneil Dolin, MD;  Location: [facility] ORS;  Service: Endoscopy;  Laterality: N/A;  . BREAST BIOPSY  10/18/2011   Procedure: BREAST BIOPSY;  Surgeon: Jamesetta So, MD;  Location: [facility] ORS;  Service: General;  Laterality: Left;  . COLON RESECTION N/A 06/23/2013   Procedure: HAND ASSISTED LAPAROSCOPIC PARTIAL COLECTOMY;  Surgeon: Jamesetta So, MD;  Location: [facility] ORS;  Service: General;  Laterality: N/A;  .  COLONOSCOPY    . COLONOSCOPY N/A 05/29/2013   ZOX:WRUEAVW mass most likely representing colorectal cancer S/ P biospy.Multiple colonic and rectal polyps removed/treated as described above. Colonic diverticulosis  . COLONOSCOPY WITH PROPOFOL N/A 08/13/2014   RMR: Status post sigmoid colectomy. Multiple colonic polyps removed as described above. Redundant colon. Pan colonic diverticuloisi  . COLONOSCOPY WITH PROPOFOL N/A 05/22/2016   Procedure: COLONOSCOPY WITH PROPOFOL;  Surgeon: Daneil Dolin, MD;  Location: [facility] ENDO SUITE;  Service: Endoscopy;  Laterality: N/A;  7:30 am  . ESOPHAGEAL  DILATION N/A 08/13/2014   Procedure: [place];  Surgeon: Daneil Dolin, MD;  Location: [facility] ORS;  Service: Endoscopy;  Laterality: N/A;  . ESOPHAGOGASTRODUODENOSCOPY N/A 12/26/2012   UJW:[id number] reflux esophagitis. Gastric and duodenal bulbar erosions-status post gastric biopsynegative H.pylori  . ESOPHAGOGASTRODUODENOSCOPY (EGD) WITH PROPOFOL N/A 08/13/2014   RMR: Small benign cystic-appearing lesion in distal esophagus of doubtful clinical significance, otherwise normal esophagus, status post Maloney dilation. Small hiatal hernia, some retained gastric contents (query delayed gastric emptying.)  . partial hysterectomy    . POLYPECTOMY N/A 08/13/2014   Procedure: POLYPECTOMY;  Surgeon: Daneil Dolin, MD;  Location: [facility] OR[facility];  Service: Endoscopy;  Laterality: N/A;     OB History    Gravida  4   Para  3   Term  3   Preterm      AB  1   Living  3     SAB      IAB  1   Ectopic      Multiple      Live Births  3           Family History  Problem Relation Age of Onset  . Drug abuse Mother   . Colon cancer Father        diagnosed in his 48s  . Drug abuse Father   . HIV/AIDS Brother   . Anesthesia problems Neg Hx   . Hypotension Neg Hx   . Malignant hyperthermia Neg Hx   . Pseudochol deficiency Neg Hx     Social History   Tobacco Use  . Smoking status: Current Every Day Smoker    Packs/day: 0.50    Years: 38.00    Pack years: 19.00    Types: Cigarettes    Last attempt to quit: 06/19/2014    Years since quitting: 6.3  . Smokeless tobacco: Never Used  . Tobacco comment: vapor only  Vaping Use  . Vaping Use: Never used  Substance Use Topics  . Alcohol use: No    Alcohol/week: 0.0 standard drinks  . Drug use: No    Home Medications Prior to Admission medications   Medication Sig Start Date End Date Taking? Authorizing Provider  acetaminophen (TYLENOL) 650 MG CR tablet Take 650 mg by mouth every 8 (eight) hours as needed for pain.     [provider]  albuterol (VENTOLIN HFA) 108 (90 Base) MCG/ACT inhaler Inhale 2 puffs into the lungs every 4 (four) hours as needed. 07/21/20   [provider]  Ascorbic Acid (VITAMIN C) 250 MG CHEW Chew 1 tablet by mouth daily.  Patient not taking: No sig reported    [provider]  benazepril (LOTENSIN) 20 MG tablet Take 20 mg by mouth daily.  Patient not taking: No sig reported 11/20/17   [provider]  Cholecalciferol (VITAMIN D) 2000 units CAPS Take 2,000 Units by mouth daily.     [provider]  Cyanocobalamin (  [employer] VITAMIN B-12) 5000 MCG SUBL Place 1 tablet under the tongue daily.    [provider]  FLUoxetine (PROZAC) 40 MG capsule Take 1 capsule (40 mg total) by mouth daily. 07/26/20   [name], MD  hydrochlorothiazide (HYDRODIURIL) 25 MG tablet Take 1 tablet by mouth daily. 08/23/20   [provider]  hydrOXYzine (ATARAX/VISTARIL) 25 MG tablet Take 25 mg by mouth every 8 (eight) hours. 09/08/20   [provider]  IBU 800 MG tablet Take 1 tablet by mouth every 6 (six) hours as needed for mild pain or moderate pain.  12/14/17   [provider]  loratadine (CLARITIN) 10 MG tablet Take 10 mg by mouth daily. 08/05/20   [provider]  olmesartan (BENICAR) 5 MG tablet Take 10 mg by mouth daily. Patient not taking: No sig reported 09/06/20   [provider]  OLMESARTAN MEDOXOMIL PO Take 10 mg by mouth daily.    [provider]  omeprazole (PRILOSEC) 20 MG capsule Take 1 capsule by mouth daily. 08/25/20   [provider]  oxybutynin (DITROPAN) 5 MG tablet Take 5 mg by mouth 2 (two) times daily. 07/18/20   [provider]  SPIRIVA RESPIMAT 1.25 MCG/ACT AERS Inhale 1 puff into the lungs daily. 10/18/20   [provider]  thiamine (VITAMIN B-1) 100 MG tablet Take 100 mg by mouth daily. Patient not taking: No sig reported    [provider]  traZODone (DESYREL)  150 MG tablet Take 300 mg by mouth at bedtime. 10/14/20   [provider]  VITAMIN A PO Take 1 tablet by mouth daily.  Patient not taking: No sig reported    [provider]  vitamin E 400 UNIT capsule Take 400 Units by mouth daily. Patient not taking: No sig reported    [provider]  zinc gluconate 50 MG tablet Take 50 mg by mouth daily.    [provider]  ziprasidone (GEODON) 20 MG capsule Take 1 capsule (20 mg total) by mouth 2 (two) times daily with a meal. 09/30/20 12/29/20  [name], MD    Allergies    Patient has no known allergies.  Review of Systems   Review of Systems  Constitutional: Negative for fever.  Respiratory: Negative for cough and shortness of breath.   Cardiovascular: Negative for chest pain.  Gastrointestinal: Positive for abdominal pain and constipation. Negative for nausea and vomiting.  Neurological: Negative for weakness, numbness and headaches.  All other systems reviewed and are negative.   Physical Exam Updated Vital Signs BP (!) 143/84 (BP Location: Right Arm)   Pulse 89   Temp 98.3 F (36.8 C) (Oral)   Resp 20   Ht 5\' 4"  (1.626 m)   Wt 87.5 kg   SpO2 98%   BMI 33.13 kg/m   Physical Exam Vitals and nursing note reviewed.  Constitutional:      Appearance: Normal appearance. She is well-developed.  HENT:     Head: Normocephalic and atraumatic.  Eyes:     General: Lids are normal.     Conjunctiva/sclera: Conjunctivae normal.     Pupils: Pupils are equal, round, and reactive to light.  Cardiovascular:     Rate and Rhythm: Normal rate and regular rhythm.     Pulses: Normal pulses.     Heart sounds: Normal heart sounds. No murmur heard. No friction rub. No gallop.   Pulmonary:     Effort: Pulmonary effort is normal.     Breath  sounds: Normal breath sounds.     Comments: Lungs clear to auscultation bilaterally.  Symmetric chest rise.  No wheezing, rales, rhonchi. Abdominal:     Palpations: Abdomen  is soft. Abdomen is not rigid.     Tenderness: There is generalized abdominal tenderness. There is no guarding.     Comments: Abdomen is soft.  She has generalized tenderness no focal point.  No rigidity, guarding.  Musculoskeletal:        General: Normal range of motion.     Cervical back: Full passive range of motion without pain.  Skin:    General: Skin is warm and dry.     Capillary Refill: Capillary refill takes less than 2 seconds.  Neurological:     Mental Status: She is alert and oriented to person, place, and time.  Psychiatric:        Speech: Speech normal.     ED Results / Procedures / Treatments   Labs (all labs ordered are listed, but only abnormal results are displayed) Labs Reviewed  COMPREHENSIVE METABOLIC PANEL - Abnormal; Notable for the following components:      Result Value   Sodium 134 (*)    All other components within normal limits  CBC WITH DIFFERENTIAL/PLATELET  LIPASE, BLOOD    EKG None  Radiology DG Abdomen 1 View  Result Date: 10/23/2020 CLINICAL DATA:  Abdominal pain. No bowel movement for the past 2 weeks. EXAM: ABDOMEN - 1 VIEW COMPARISON:  06/15/2016.  Abdomen and pelvis CT obtained yesterday. FINDINGS: Normal bowel gas pattern. Again noted is prominent stool throughout the colon. Lumbar and lower thoracic spine degenerative changes. IMPRESSION: No acute abnormality.  Prominent stool throughout the colon. Electronically Signed   By: Claudie Revering M.D.   On: 10/23/2020 18:41   CT ABDOMEN PELVIS W CONTRAST  Result Date: 10/22/2020 CLINICAL DATA:  Acute nonlocalized abdominal pain for 2 weeks. Worsening today. EXAM: CT ABDOMEN AND PELVIS WITH CONTRAST TECHNIQUE: Multidetector CT imaging of the abdomen and pelvis was performed using the standard protocol following bolus administration of intravenous contrast. CONTRAST:  127mL OMNIPAQUE IOHEXOL 300 MG/ML  SOLN COMPARISON:  CT 09/13/2018. FINDINGS: Lower chest: Clear lung bases. No significant pleural or  pericardial effusion. There is atherosclerosis of the aorta and coronary arteries. Hepatobiliary: Several hepatic cysts are similar to the previous study. No new or enlarging hepatic lesions identified. No evidence of gallstones, gallbladder wall thickening or biliary dilatation. Pancreas: Stable without acute findings. Possible pancreas divisum. No surrounding inflammatory changes. Spleen: Normal in size without focal abnormality. Adrenals/Urinary Tract: Both adrenal glands appear normal. Similar appearing small renal cysts bilaterally. No evidence of urinary tract calculus or hydronephrosis. The bladder appears normal. Stomach/Bowel: No enteric contrast was administered. The stomach appears unremarkable for its degree of distension. No evidence of bowel wall thickening, distention or surrounding inflammatory change. The appendix appears normal. There is a moderate to large amount of stool throughout the colon with high density components in the sigmoid colon proximal to a rectosigmoid anastomosis. Vascular/Lymphatic: There are no enlarged abdominal or pelvic lymph nodes. Aortic and branch vessel atherosclerosis. No acute vascular findings. The portal, superior mesenteric and splenic veins are patent. Reproductive: Hysterectomy.  No adnexal mass. Other: No evidence of abdominal wall mass or hernia. No ascites. Postsurgical changes in the low anterior abdominal wall. Musculoskeletal: No acute or significant osseous findings. IMPRESSION: 1. No acute findings or clear explanation for the patient's symptoms. 2. There is a moderate to large amount of stool throughout the  colon with high density components in the sigmoid colon proximal to the patient's anastomosis consistent with constipation. This may contribute to the patient's symptoms. 3. Grossly stable hepatic and renal cysts. 4.  Aortic Atherosclerosis (ICD10-I70.0). Electronically Signed   By: Richardean Sale M.D.   On: 10/22/2020 20:52     Procedures Procedures   Medications Ordered in ED Medications  polyethylene glycol-electrolytes (NuLYTELY) solution 4,000 mL (4,000 mLs Oral Given 10/23/20 1852)  fentaNYL (SUBLIMAZE) injection 50 mcg (50 mcg Intravenous Given 10/23/20 1819)  ondansetron (ZOFRAN) injection 4 mg (4 mg Intravenous Given 10/23/20 1821)    ED Course  I have reviewed the triage vital signs and the nursing notes.  Pertinent labs & imaging results that were available during my care of the patient were reviewed by me and considered in my medical decision making (see chart for details).    MDM Rules/Calculators/A&P                          65 year old female who presents for evaluation of constipation.  She reports that for the last 2 weeks, she has had small amount of stools that she describes as gravy.  She was seen here last night for evaluation of symptoms.  CT at that time revealed no concerning cause.  It did show moderate stool burden.  Patient had an enema but left prior to having a bowel movement.  She comes back in today stating that she is still have been unable to have a bowel movement.  She reports worsening abdominal pain/pressure.  She has taken mag citrate at home with no improvement in symptoms.  On initial arrival, she is afebrile, appears uncomfortable but no acute distress.  She has some generalized tenderness noted to her abdomen with no focal point.  No rigidity, guarding.  Concern for constipation.  She is not vomiting.  I do not feel that we need to repeat her CT scan.  We will obtain a KUB to ensure that there is no perforation or air seen.  Will obtain basic labs, give GoLytely.  Review of CT scan from yesterday shows no acute findings.  There is moderate to large amount of stool throughout the colon.  Lipase normal. CBC shows no leukocytosis or anemia. CMP shows normal BUN/CR.  KUB shows no acute abnormality.  There is prominent stool throughout the colon.  Patient has drank about 4 cups of  Golytely and has still not been able to have bowel movement.  We will proceed with enema.  Reevaluation after reevaluation after enema.  Patient has had 3 bowel movements, large in size.  Patient does appear more comfortable and states that she feels better.  She still having some slight abdominal pressure but does report feeling better after enema.  At this time, patient is hemodynamically stable.  She has had a bowel movement here in the ED.  Feel that she is stable for discharge.  She was referred to GI yesterday.  I discussed with her that it is a good idea to follow-up with them as a way to determine the cause of her constipation and to develop a plan for ongoing management of constipation. At this time, patient exhibits no emergent life-threatening condition that require further evaluation in ED. Patient had ample opportunity for questions and discussion. All patient's questions were answered with full understanding. Strict return precautions discussed. Patient expresses understanding and agreement to plan.   Portions of this note were generated with Dragon  dictation software. Dictation errors may occur despite best attempts at proofreading.  Final Clinical Impression(s) / ED Diagnoses Final diagnoses:  Constipation, unspecified constipation type    Rx / DC Orders ED Discharge Orders    None       Desma Mcgregor 10/23/20 2319    Noemi Chapel, MD 10/24/20 1750

## 2020-10-23 NOTE — ED Notes (Signed)
Pt again up to bedside commode for bowel movement.

## 2020-10-23 NOTE — ED Notes (Addendum)
Pt has been on bedside commode multiple times with loose stool noted. Explained ordered soap suds enema that can be given once she feels she is finished going to the bathroom at this time.

## 2020-10-23 NOTE — Discharge Instructions (Signed)
As we discussed, it is very important for you to follow-up with GI to determine cause and ongoing management for your constipation.  Return to emergency department for any worsening abdominal pain, vomiting, fevers, difficulty breathing or any other worsening or concerning symptoms.

## 2020-10-25 ENCOUNTER — Ambulatory Visit (INDEPENDENT_AMBULATORY_CARE_PROVIDER_SITE_OTHER): Payer: Medicare HMO | Admitting: Gastroenterology

## 2020-10-25 ENCOUNTER — Encounter: Payer: Self-pay | Admitting: Gastroenterology

## 2020-10-25 ENCOUNTER — Telehealth: Payer: Self-pay | Admitting: Gastroenterology

## 2020-10-25 ENCOUNTER — Other Ambulatory Visit: Payer: Self-pay

## 2020-10-25 VITALS — BP 125/88 | HR 105 | Temp 97.6°F | Ht 64.0 in | Wt 190.2 lb

## 2020-10-25 DIAGNOSIS — K5900 Constipation, unspecified: Secondary | ICD-10-CM | POA: Diagnosis not present

## 2020-10-25 DIAGNOSIS — R112 Nausea with vomiting, unspecified: Secondary | ICD-10-CM | POA: Diagnosis not present

## 2020-10-25 DIAGNOSIS — R1084 Generalized abdominal pain: Secondary | ICD-10-CM | POA: Diagnosis not present

## 2020-10-25 NOTE — Telephone Encounter (Signed)
Patient did not go to [facility] ER. EMS took her to [facility]. Please contact ED and send copy of today's ov note so we can update ER provider with concerns.

## 2020-10-25 NOTE — Progress Notes (Signed)
Primary Care Physician:  Jani Gravel, MD  Primary Gastroenterologist:  Garfield Cornea, MD   Chief Complaint  Patient presents with  . Abdominal Pain    Constant lower abd pain x Friday. Severe pain  . Nausea    W/ vomiting    HPI:  Ashley Patrick is a 65 y.o. female here for urgent same-day appointment.  We last saw the patient in 2018.  She has a history of GERD, colon cancer (2014) underwent partial colectomy in January 2015 and did not require adjuvant therapy.  Presents today for constipation and abdominal pain.  Patient seen in the ED on May 6 and May 7 for constipation and abdominal pain.  Patient states she has been having constipation off and on for 8 months.  She provides limited history in the office today, appears very uncomfortable.  Cannot give any specifics regarding prior management of her constipation.  States she is taken some over-the-counter stuff. Tried prune juice.  According to the ED report she has been having increasing difficulty over the past 2 weeks.  Passing small amount of gravy consistency stools but does not feel like she is getting any relief.  Complains of progressive abdominal pain.  Worse than when she was in the ED on May 6.  States she is now vomiting.  Cannot keep anything down.  Denies hematemesis.  Complains of diffuse abdominal pain, severe.  No melena or rectal bleeding.  She is not sure if she has had fever but she has had chills.    Completed CT May 6 and abdominal x-ray May 7 as outlined below.  In the ED she received enemas and also several glasses of GoLytely.  When she was discharged from the ED on May 7, it was noted that she appeared to be much more comfortable after several large bowel movements.  Patient states after going home her symptoms progressed over the pass 24-48 hours. Patient states she is going to kill herself if she does not get relief.     CT abdomen pelvis with contrast 10/22/2020: IMPRESSION: 1. No acute findings or clear  explanation for the patient's symptoms. 2. There is a moderate to large amount of stool throughout the colon with high density components in the sigmoid colon proximal to the patient's anastomosis consistent with constipation. This may contribute to the patient's symptoms. 3. Grossly stable hepatic and renal cysts. 4.  Aortic Atherosclerosis (ICD10-I70.0). Not mentioned under impression that within the body of the report, "possible pancreas divisum".  Abdominal x-ray, 1 view 10/23/2020: IMPRESSION: No acute abnormality.  Prominent stool throughout the colon.  Colonoscopy December 2017 with multiple tubular adenomas removed.  Surgical anastomosis in the sigmoid colon noted, appeared patent, question somewhat narrowed just proximal anastomosis.  Follow-up barium enema for further evaluation of the anastomosis showed no stricture.  Next colonoscopy was planned for December 2020.  EGD February 2016, minimal retained gastric contents, small benign cystic appearing lesion in the distal esophagus, small hiatal hernia.    Current Outpatient Medications  Medication Sig Dispense Refill  . acetaminophen (TYLENOL) 650 MG CR tablet Take 650 mg by mouth every 8 (eight) hours as needed for pain.    Marland Kitchen albuterol (VENTOLIN HFA) 108 (90 Base) MCG/ACT inhaler Inhale 2 puffs into the lungs every 4 (four) hours as needed.    . Cholecalciferol (VITAMIN D) 2000 units CAPS Take 2,000 Units by mouth daily.     . Cyanocobalamin ([employer] VITAMIN B-12) 5000 MCG SUBL Place 1 tablet under the  tongue daily.    Marland Kitchen FLUoxetine (PROZAC) 40 MG capsule Take 1 capsule (40 mg total) by mouth daily. 90 capsule 0  . hydrochlorothiazide (HYDRODIURIL) 25 MG tablet Take 1 tablet by mouth daily.    . hydrOXYzine (ATARAX/VISTARIL) 25 MG tablet Take 25 mg by mouth every 8 (eight) hours.    . IBU 800 MG tablet Take 1 tablet by mouth every 6 (six) hours as needed for mild pain or moderate pain.     Marland Kitchen loratadine (CLARITIN) 10 MG tablet Take  10 mg by mouth daily.    Marland Kitchen olmesartan (BENICAR) 5 MG tablet Take 10 mg by mouth daily.    Marland Kitchen OLMESARTAN MEDOXOMIL PO Take 10 mg by mouth daily.    Marland Kitchen omeprazole (PRILOSEC) 20 MG capsule Take 1 capsule by mouth daily.    Marland Kitchen oxybutynin (DITROPAN) 5 MG tablet Take 5 mg by mouth 2 (two) times daily.    Marland Kitchen SPIRIVA RESPIMAT 1.25 MCG/ACT AERS Inhale 1 puff into the lungs daily.    Marland Kitchen thiamine (VITAMIN B-1) 100 MG tablet Take 100 mg by mouth daily.    . traZODone (DESYREL) 150 MG tablet Take 300 mg by mouth at bedtime.    Marland Kitchen VITAMIN A PO Take 1 tablet by mouth daily.    . vitamin E 400 UNIT capsule Take 400 Units by mouth daily.    Marland Kitchen zinc gluconate 50 MG tablet Take 50 mg by mouth daily.    . ziprasidone (GEODON) 20 MG capsule Take 1 capsule (20 mg total) by mouth 2 (two) times daily with a meal. 180 capsule 0  . Ascorbic Acid (VITAMIN C) 250 MG CHEW Chew 1 tablet by mouth daily.  (Patient not taking: No sig reported)    . benazepril (LOTENSIN) 20 MG tablet Take 20 mg by mouth daily.  (Patient not taking: No sig reported)     No current facility-administered medications for this visit.    Allergies as of 10/25/2020  . (No Known Allergies)    Past Medical History:  Diagnosis Date  . Back pain   . Cancer [facility]) 2014   Colon Cancer  . Hypercholesteremia   . Hypertension   . Lateral epicondylitis  of elbow    right  . Migraine headache   . Nicotine addiction   . Obesity    History of  . OD (overdose of drug)    hospitalized for 11 days   . Psychosis [facility])     Past Surgical History:  Procedure Laterality Date  . ABDOMINAL HYSTERECTOMY    . BIOPSY N/A 08/13/2014   Procedure: BIOPSY;  Surgeon: Daneil Dolin, MD;  Location: [facility] ORS;  Service: Endoscopy;  Laterality: N/A;  . BREAST BIOPSY  10/18/2011   Procedure: BREAST BIOPSY;  Surgeon: Jamesetta So, MD;  Location: [facility] ORS;  Service: General;  Laterality: Left;  . COLON RESECTION N/A 06/23/2013   Procedure: HAND ASSISTED LAPAROSCOPIC PARTIAL  COLECTOMY;  Surgeon: Jamesetta So, MD;  Location: [facility];  Service: General;  Laterality: N/A;  . COLONOSCOPY    . COLONOSCOPY N/A 05/29/2013   [id number] mass most likely representing colorectal cancer S/ P biospy.Multiple colonic and rectal polyps removed/treated as described above. Colonic diverticulosis  . COLONOSCOPY WITH PROPOFOL N/A 08/13/2014   RMR: Status post sigmoid colectomy. Multiple colonic polyps removed as described above. Redundant colon. Pan colonic diverticuloisi  . COLONOSCOPY WITH PROPOFOL N/A 05/22/2016   Procedure: COLONOSCOPY WITH PROPOFOL;  Surgeon: Daneil Dolin, MD;  Location: [facility]  ENDO SUITE;  Service: Endoscopy;  Laterality: N/A;  7:30 am  . ESOPHAGEAL DILATION N/A 08/13/2014   Procedure: [place];  Surgeon: Daneil Dolin, MD;  Location: [facility];  Service: Endoscopy;  Laterality: N/A;  . ESOPHAGOGASTRODUODENOSCOPY N/A 12/26/2012   FIE:PPIRJJO reflux esophagitis. Gastric and duodenal bulbar erosions-status post gastric biopsynegative H.pylori  . ESOPHAGOGASTRODUODENOSCOPY (EGD) WITH PROPOFOL N/A 08/13/2014   RMR: Small benign cystic-appearing lesion in distal esophagus of doubtful clinical significance, otherwise normal esophagus, status post Maloney dilation. Small hiatal hernia, some retained gastric contents (query delayed gastric emptying.)  . partial hysterectomy    . POLYPECTOMY N/A 08/13/2014   Procedure: POLYPECTOMY;  Surgeon: Daneil Dolin, MD;  Location: [facility];  Service: Endoscopy;  Laterality: N/A;    Family History  Problem Relation Age of Onset  . Drug abuse Mother   . Colon cancer Father        diagnosed in his 30s  . Drug abuse Father   . HIV/AIDS Brother   . Anesthesia problems Neg Hx   . Hypotension Neg Hx   . Malignant hyperthermia Neg Hx   . Pseudochol deficiency Neg Hx     Social History   Socioeconomic History  . Marital status: Legally Separated    Spouse name: Not on file  . Number of children: 3  .  Years of education: Not on file  . Highest education level: Not on file  Occupational History  . Not on file  Tobacco Use  . Smoking status: Current Every Day Smoker    Packs/day: 0.50    Years: 38.00    Pack years: 19.00    Types: Cigarettes    Last attempt to quit: 06/19/2014    Years since quitting: 6.3  . Smokeless tobacco: Never Used  . Tobacco comment: vapor only  Vaping Use  . Vaping Use: Never used  Substance and Sexual Activity  . Alcohol use: No    Alcohol/week: 0.0 standard drinks  . Drug use: No  . Sexual activity: Yes    Birth control/protection: Surgical    Comment: hyst  Other Topics Concern  . Not on file  Social History Narrative  . Not on file   Social Determinants of Health   Financial Resource Strain: Not on file  Food Insecurity: Not on file  Transportation Needs: Not on file  Physical Activity: Not on file  Stress: Not on file  Social Connections: Not on file  Intimate Partner Violence: Not on file      ROS:  General: Negative for anorexia, weight loss, fever, chills, fatigue, weakness. Eyes: Negative for vision changes.  ENT: Negative for hoarseness, difficulty swallowing , nasal congestion. CV: Negative for chest pain, angina, palpitations, dyspnea on exertion, peripheral edema.  Respiratory: Negative for dyspnea at rest, dyspnea on exertion, cough, sputum, wheezing.  GI: See history of present illness. GU:  Negative for dysuria, hematuria, urinary incontinence, urinary frequency, nocturnal urination.  MS: Negative for joint pain, low back pain.  Derm: Negative for rash or itching.  Neuro: Negative for weakness, abnormal sensation, seizure, frequent headaches, memory loss, confusion.  Psych: Negative for anxiety, depression, suicidal ideation, hallucinations.  Endo: Negative for unusual weight change.  Heme: Negative for bruising or bleeding. Allergy: Negative for rash or hives.    Physical Examination:  BP 125/88   Pulse (!) 105    Temp 97.6 F (36.4 C)   Ht 5\' 4"  (1.626 m)   Wt 190 lb 3.2 oz (86.3  kg)   BMI 32.65 kg/m    General: Well-nourished, well-developed, groaning and appears uncomfortable. Cooperative.  Head: Normocephalic, atraumatic.   Eyes: Conjunctiva pink, no icterus. Mouth: Oropharyngeal mucosa moist and pink , no lesions erythema or exudate. Neck: Supple without thyromegaly, masses, or lymphadenopathy.  Lungs: Clear to auscultation bilaterally.  Heart: Regular rate and rhythm, no murmurs rubs or gallops.  Abdomen: difficult to hear bowel sounds due to patient's groaning. Abdomen is slightly distended but soft. Diffusely tender with palpation. Subjective guarding but no rebound.   Rectal: not performed Extremities: No lower extremity edema. No clubbing or deformities.  Neuro: Alert and oriented x 4 , grossly normal neurologically.  Skin: Warm and dry, no rash or jaundice.   Psych: Alert and cooperative, normal mood and affect.  Labs: Lab Results  Component Value Date   LIPASE 27 10/23/2020   Lab Results  Component Value Date   CREATININE 0.98 10/23/2020   BUN 14 10/23/2020   NA 134 (L) 10/23/2020   K 3.7 10/23/2020   CL 103 10/23/2020   CO2 24 10/23/2020   Lab Results  Component Value Date   ALT 15 10/23/2020   AST 17 10/23/2020   ALKPHOS 67 10/23/2020   BILITOT 0.5 10/23/2020   Lab Results  Component Value Date   WBC 5.8 10/23/2020   HGB 12.6 10/23/2020   HCT 36.6 10/23/2020   MCV 88.4 10/23/2020   PLT 220 10/23/2020   Lactic acid elevated at 2.2 on 10/22/2020  Imaging Studies: DG Abdomen 1 View  Result Date: 10/23/2020 CLINICAL DATA:  Abdominal pain. No bowel movement for the past 2 weeks. EXAM: ABDOMEN - 1 VIEW COMPARISON:  06/15/2016.  Abdomen and pelvis CT obtained yesterday. FINDINGS: Normal bowel gas pattern. Again noted is prominent stool throughout the colon. Lumbar and lower thoracic spine degenerative changes. IMPRESSION: No acute abnormality.  Prominent stool  throughout the colon. Electronically Signed   By: Claudie Revering M.D.   On: 10/23/2020 18:41   CT ABDOMEN PELVIS W CONTRAST  Result Date: 10/22/2020 CLINICAL DATA:  Acute nonlocalized abdominal pain for 2 weeks. Worsening today. EXAM: CT ABDOMEN AND PELVIS WITH CONTRAST TECHNIQUE: Multidetector CT imaging of the abdomen and pelvis was performed using the standard protocol following bolus administration of intravenous contrast. CONTRAST:  147mL OMNIPAQUE IOHEXOL 300 MG/ML  SOLN COMPARISON:  CT 09/13/2018. FINDINGS: Lower chest: Clear lung bases. No significant pleural or pericardial effusion. There is atherosclerosis of the aorta and coronary arteries. Hepatobiliary: Several hepatic cysts are similar to the previous study. No new or enlarging hepatic lesions identified. No evidence of gallstones, gallbladder wall thickening or biliary dilatation. Pancreas: Stable without acute findings. Possible pancreas divisum. No surrounding inflammatory changes. Spleen: Normal in size without focal abnormality. Adrenals/Urinary Tract: Both adrenal glands appear normal. Similar appearing small renal cysts bilaterally. No evidence of urinary tract calculus or hydronephrosis. The bladder appears normal. Stomach/Bowel: No enteric contrast was administered. The stomach appears unremarkable for its degree of distension. No evidence of bowel wall thickening, distention or surrounding inflammatory change. The appendix appears normal. There is a moderate to large amount of stool throughout the colon with high density components in the sigmoid colon proximal to a rectosigmoid anastomosis. Vascular/Lymphatic: There are no enlarged abdominal or pelvic lymph nodes. Aortic and branch vessel atherosclerosis. No acute vascular findings. The portal, superior mesenteric and splenic veins are patent. Reproductive: Hysterectomy.  No adnexal mass. Other: No evidence of abdominal wall mass or hernia. No ascites. Postsurgical changes in the  low  anterior abdominal wall. Musculoskeletal: No acute or significant osseous findings. IMPRESSION: 1. No acute findings or clear explanation for the patient's symptoms. 2. There is a moderate to large amount of stool throughout the colon with high density components in the sigmoid colon proximal to the patient's anastomosis consistent with constipation. This may contribute to the patient's symptoms. 3. Grossly stable hepatic and renal cysts. 4.  Aortic Atherosclerosis (ICD10-I70.0). Electronically Signed   By: Richardean Sale M.D.   On: 10/22/2020 20:52     Assessment:  Pleasant 65 y/o female with history of colon cancer s/p resection in 2019, did not require adjuvant treatment, seen in the ED, May 6 and May 7 for constipation and abdominal pain.  She presents to our office today for urgent evaluation for progressive abdominal pain, constipation, intractable vomiting.  CT abdomen pelvis May 6 without any acute findings.  Questionable pancreas divisum, moderate to large amount of stool throughout the colon, appendix appeared normal. Labs unremarkable except for mildly low lactic acid. Abdominal x-ray the following day with no evidence of acute abnormality, prominent stool throughout the colon remaining.  Patient reports progressive abdominal pain of the past 24 to 48 hours.  Reports intractable vomiting.  No hematemesis or blood in the stool.  Very uncomfortable in the office today.  We recommended reevaluation in the ER, consideration of repeat CT scan and labs.  Patient did not feel she could drive herself to the emergency department, EMS was activated.  Of note, in the office patient reported that she cannot go on with this pain, she would kill herself if she did not get relief.    Plan: 1. Spoke to triage at [facility] and gave updated report including patient's comment about killing herself if she did not get relief.  2. Would consider updating CT A/P and labs while in ED. She may need overnight  observation at minimum to adequately manage her constipation/abdominal pain/vomiting.  3. At time of discharge she will need bowel regimen given chronic constipation. Consider Linzess 290 mcg daily or Amitiza 24 mcg BID, or Trulance 3 mg daily whichever is covered by her insurance.

## 2020-10-25 NOTE — Telephone Encounter (Signed)
Spoke to nurse at [facility] ED and informed them that I was going to fax over today's ov note.  She voiced understanding.  Faxed accordingly.

## 2020-10-25 NOTE — Patient Instructions (Signed)
1. Due to severe abdominal pain and vomiting, we have recommended you go back to ER for re-evaluation. You may need additional CT scan. EMS has been called at your request to take you to the ER.

## 2020-10-27 NOTE — Progress Notes (Signed)
CC'ED TO PCP 
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
## 2020-11-03 ENCOUNTER — Other Ambulatory Visit: Payer: Self-pay

## 2020-11-03 ENCOUNTER — Telehealth (INDEPENDENT_AMBULATORY_CARE_PROVIDER_SITE_OTHER): Payer: Medicare HMO | Admitting: Psychiatry

## 2020-11-03 ENCOUNTER — Encounter: Payer: Self-pay | Admitting: Psychiatry

## 2020-11-03 DIAGNOSIS — F331 Major depressive disorder, recurrent, moderate: Secondary | ICD-10-CM

## 2020-11-03 MED ORDER — FLUOXETINE HCL 40 MG PO CAPS: 40.0000 mg | ORAL_CAPSULE | Freq: Every day | ORAL | 0 refills | Status: DC

## 2020-11-03 MED ORDER — LATUDA 20 MG PO TABS: 20.0000 mg | ORAL_TABLET | Freq: Every day | ORAL | 1 refills | Status: DC

## 2020-11-03 NOTE — Patient Instructions (Signed)
1. Continue fluoxetine 40 mg daily 2. Discontinue Geodon 3. Start latuda 20 mg daily  4. Next appointment 7/11 at 1:20

## 2020-11-10 ENCOUNTER — Other Ambulatory Visit: Payer: Self-pay

## 2020-11-10 ENCOUNTER — Emergency Department (HOSPITAL_COMMUNITY)
Admission: EM | Admit: 2020-11-10 | Discharge: 2020-11-11 | Disposition: A | Discharge status: Home / Self Care | Payer: Medicare HMO | Attending: Emergency Medicine | Admitting: Emergency Medicine

## 2020-11-10 ENCOUNTER — Encounter (HOSPITAL_COMMUNITY): Payer: Self-pay

## 2020-11-10 DIAGNOSIS — H9202 Otalgia, left ear: Secondary | ICD-10-CM | POA: Insufficient documentation

## 2020-11-10 DIAGNOSIS — Z5321 Procedure and treatment not carried out due to patient leaving prior to being seen by health care provider: Secondary | ICD-10-CM | POA: Insufficient documentation

## 2020-11-10 NOTE — ED Triage Notes (Signed)
Pt c/o of left ear pain that is radiating down jaw. Pt states she tried to see her PCP today but was unable to get an appt. Until next week.

## 2020-11-11 NOTE — ED Notes (Signed)
Pt seen opening her door and walking out of the building. Pt called out minutes prior saying that she was ready to go home. EDP made aware.

## 2020-12-08 ENCOUNTER — Institutional Professional Consult (permissible substitution): Payer: 59 | Admitting: Plastic Surgery
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
## 2020-12-27 ENCOUNTER — Other Ambulatory Visit: Payer: Self-pay

## 2020-12-27 ENCOUNTER — Ambulatory Visit (INDEPENDENT_AMBULATORY_CARE_PROVIDER_SITE_OTHER): Payer: Medicare Other | Admitting: Psychiatry

## 2020-12-27 ENCOUNTER — Encounter: Payer: Self-pay | Admitting: Psychiatry

## 2020-12-27 VITALS — BP 159/76 | HR 88 | Temp 97.7°F | Wt 186.4 lb

## 2020-12-27 DIAGNOSIS — F331 Major depressive disorder, recurrent, moderate: Secondary | ICD-10-CM

## 2020-12-27 DIAGNOSIS — G2401 Drug induced subacute dyskinesia: Secondary | ICD-10-CM | POA: Diagnosis not present

## 2020-12-27 MED ORDER — VALBENAZINE TOSYLATE 40 MG PO CAPS: 40.0000 mg | ORAL_CAPSULE | Freq: Every day | ORAL | 1 refills | Status: DC

## 2020-12-27 MED ORDER — FLUOXETINE HCL 20 MG PO CAPS: ORAL_CAPSULE | ORAL | 0 refills | Status: DC

## 2020-12-27 MED ORDER — LATUDA 20 MG PO TABS: 20.0000 mg | ORAL_TABLET | Freq: Every day | ORAL | 0 refills | Status: DC

## 2020-12-27 MED ORDER — FLUOXETINE HCL 40 MG PO CAPS: 40.0000 mg | ORAL_CAPSULE | Freq: Every day | ORAL | 0 refills | Status: DC

## 2020-12-27 NOTE — Patient Instructions (Signed)
1. Increase fluoxetine 60 mg daily 2. Continue Latuda 20 mg daily  3. Start valbenazine 40 mg daily  4. Next appointment: 8/31 at 9 Am

## 2020-12-28 ENCOUNTER — Telehealth: Payer: Self-pay

## 2020-12-28 NOTE — Telephone Encounter (Signed)
went online to [email or web address] and submitted the prior auth for the ingrezza. - pending

## 2020-12-28 NOTE — Telephone Encounter (Signed)
received notice that prior auth for ingrezza 40mg  was approved until 06-18-21

## 2020-12-28 NOTE — Telephone Encounter (Signed)
received fax from pharmacy that a prior auth was needed for the [place].

## 2021-01-12 ENCOUNTER — Encounter: Payer: Self-pay | Admitting: Plastic Surgery

## 2021-01-12 ENCOUNTER — Other Ambulatory Visit: Payer: Self-pay

## 2021-01-12 ENCOUNTER — Ambulatory Visit (INDEPENDENT_AMBULATORY_CARE_PROVIDER_SITE_OTHER): Payer: Medicare Other | Admitting: Plastic Surgery

## 2021-01-12 VITALS — BP 111/72 | HR 78 | Ht 66.0 in | Wt 186.2 lb

## 2021-01-12 DIAGNOSIS — M793 Panniculitis, unspecified: Secondary | ICD-10-CM | POA: Diagnosis not present

## 2021-01-12 NOTE — Progress Notes (Signed)
Referring Provider Denyce Robert, [place],  [place] [address]   CC:  Chief Complaint  Patient presents with   Advice Only      Ashley Patrick is an 65 y.o. female.  HPI: Patient presents to discuss panniculectomy.  She reports being bothered by the excess abdominal skin and fat.  This is been present for least 5 years.  She has lost a few pounds and feels like this is contributed to the skin overhang that she would like to have removed.  She has had a hysterectomy and reports some type of procedure for her colon back in 2015.  She does smoke but is not a diabetic.  No Known Allergies  Outpatient Encounter Medications as of 01/12/2021  Medication Sig   aspirin EC 81 MG tablet Take 81 mg by mouth daily. Swallow whole.   FLUoxetine (PROZAC) 20 MG capsule Total of 60 mg daily. Take along with 40 mg cap   [START ON 02/03/2021] FLUoxetine (PROZAC) 40 MG capsule Take 1 capsule (40 mg total) by mouth daily.   hydrochlorothiazide (HYDRODIURIL) 25 MG tablet Take 1 tablet by mouth daily.   hydrOXYzine (ATARAX/VISTARIL) 25 MG tablet Take 25 mg by mouth every 8 (eight) hours.   loratadine (CLARITIN) 10 MG tablet Take 10 mg by mouth daily.   lurasidone (LATUDA) 20 MG TABS tablet Take 1 tablet (20 mg total) by mouth daily.   olmesartan (BENICAR) 5 MG tablet Take 10 mg by mouth daily.   omeprazole (PRILOSEC) 20 MG capsule Take 1 capsule by mouth daily.   oxybutynin (DITROPAN) 5 MG tablet Take 5 mg by mouth 2 (two) times daily.   SPIRIVA RESPIMAT 1.25 MCG/ACT AERS Inhale 1 puff into the lungs daily.   traZODone (DESYREL) 150 MG tablet Take 300 mg by mouth at bedtime.   acetaminophen (TYLENOL) 650 MG CR tablet Take 650 mg by mouth every 8 (eight) hours as needed for pain.   albuterol (VENTOLIN HFA) 108 (90 Base) MCG/ACT inhaler Inhale 2 puffs into the lungs every 4 (four) hours as needed.   Ascorbic Acid (VITAMIN C) 250 MG CHEW Chew 1 tablet by mouth daily.    benazepril (LOTENSIN) 20 MG tablet Take 20 mg by mouth daily.   Cholecalciferol (VITAMIN D) 2000 units CAPS Take 2,000 Units by mouth daily.    Cyanocobalamin ([employer]S VITAMIN B-12) 5000 MCG SUBL Place 1 tablet under the tongue daily.   IBU 800 MG tablet Take 1 tablet by mouth every 6 (six) hours as needed for mild pain or moderate pain.    thiamine (VITAMIN B-1) 100 MG tablet Take 100 mg by mouth daily.   valbenazine (INGREZZA) 40 MG capsule Take 1 capsule (40 mg total) by mouth daily.   VITAMIN A PO Take 1 tablet by mouth daily.   vitamin E 400 UNIT capsule Take 400 Units by mouth daily.   zinc gluconate 50 MG tablet Take 50 mg by mouth daily.   No facility-administered encounter medications on file as of 01/12/2021.     Past Medical History:  Diagnosis Date   Back pain    Cancer ([place]) 2014   Colon Cancer   Hypercholesteremia    Hypertension    Lateral epicondylitis  of elbow    right   Migraine headache    Nicotine addiction    Obesity    History of   OD (overdose of drug)    hospitalized for 11 days    Psychosis (  [facility])     Past Surgical History:  Procedure Laterality Date   ABDOMINAL HYSTERECTOMY     BIOPSY N/A 08/13/2014   Procedure: BIOPSY;  Surgeon: Daneil Dolin, MD;  Location: [facility] ORS;  Service: Endoscopy;  Laterality: N/A;   BREAST BIOPSY  10/18/2011   Procedure: BREAST BIOPSY;  Surgeon: Jamesetta So, MD;  Location: [facility] ORS;  Service: General;  Laterality: Left;   COLON RESECTION N/A 06/23/2013   Procedure: HAND ASSISTED LAPAROSCOPIC PARTIAL COLECTOMY;  Surgeon: Jamesetta So, MD;  Location: [facility] ORS;  Service: General;  Laterality: N/A;   COLONOSCOPY     COLONOSCOPY N/A 05/29/2013   [id number] mass most likely representing colorectal cancer S/ P biospy.Multiple colonic and rectal polyps removed/treated as described above. Colonic diverticulosis   COLONOSCOPY WITH PROPOFOL N/A 08/13/2014   RMR: Status post sigmoid colectomy. Multiple colonic polyps removed as described  above. Redundant colon. Pan colonic diverticuloisi   COLONOSCOPY WITH PROPOFOL N/A 05/22/2016   Procedure: COLONOSCOPY WITH PROPOFOL;  Surgeon: Daneil Dolin, MD;  Location: [facility] SU[facility];  Service: Endoscopy;  Laterality: N/A;  7:30 am   ESOPHAGEAL DILATION N/A 08/13/2014   Procedure: [place];  Surgeon: Daneil Dolin, MD;  Location: [facility] ORS;  Service: Endoscopy;  Laterality: N/A;   ESOPHAGOGASTRODUODENOSCOPY N/A 12/26/2012   [id number] reflux esophagitis. Gastric and duodenal bulbar erosions-status post gastric biopsynegative H.pylori   ESOPHAGOGASTRODUODENOSCOPY (EGD) WITH PROPOFOL N/A 08/13/2014   RMR: Small benign cystic-appearing lesion in distal esophagus of doubtful clinical significance, otherwise normal esophagus, status post Maloney dilation. Small hiatal hernia, some retained gastric contents (query delayed gastric emptying.)   partial hysterectomy     POLYPECTOMY N/A 08/13/2014   Procedure: POLYPECTOMY;  Surgeon: Daneil Dolin, MD;  Location: [facility] ORS;  Service: Endoscopy;  Laterality: N/A;    Family History  Problem Relation Age of Onset   Drug abuse Mother    Colon cancer Father        diagnosed in his 71s   Drug abuse Father    HIV/AIDS Brother    Anesthesia problems Neg Hx    Hypotension Neg Hx    Malignant hyperthermia Neg Hx    Pseudochol deficiency Neg Hx     Social History   Social History Narrative   Not on file     Review of Systems General: Denies fevers, chills, weight loss CV: Denies chest pain, shortness of breath, palpitations  Physical Exam Vitals with BMI 01/12/2021 11/10/2020 10/25/2020  Height '5\' 6"'$  '5\' 4"'$  '5\' 4"'$   Weight 186 lbs 3 oz 193 lbs 190 lbs 3 oz  BMI 30.07 [id number]  Systolic [id number]  Diastolic 72 73 88  Pulse 78 74 105  Some encounter information is confidential and restricted. Go to Review Flowsheets activity to see all data.    General:  No acute distress,  Alert and oriented, Non-Toxic, Normal speech  and affect Abdomen: Abdomen is soft nontender.  I do not appreciate any hernias.  She has a few laparoscopic scars and looks to have a lower midline scar.  She has mild to moderate excess skin in the infraumbilical area.  She has supraumbilical fullness and I suspect rectus diastases.  Assessment/Plan Patient is reasonable candidate for infraumbilical panniculectomy.  We discussed the risks and benefits that include bleeding, infection, damage to surrounding structures and need for additional procedures.  I discussed the details of the procedure with her including the fact that the panniculectomy would not address  the upper abdomen.  If she did want to address the upper abdomen that would be a cosmetic charge and that would involve rectus plication along with liposuction and removal of excess skin.  She is going to think about adding that to it.  We will reviewed the expected postoperative recovery plan.  I did indicate that she would need to stop smoking.  She wants to try to move forward with the infraumbilical panniculectomy and see what happens with her insurance authorization.  We will plan to check on her policy and she if she meets the criteria.  All of her questions were answered.  Cindra Presume 01/12/2021, 10:40 AM
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
## 2021-02-16 ENCOUNTER — Encounter: Payer: Self-pay | Admitting: Psychiatry

## 2021-02-16 ENCOUNTER — Telehealth (INDEPENDENT_AMBULATORY_CARE_PROVIDER_SITE_OTHER): Payer: Medicare Other | Admitting: Psychiatry

## 2021-02-16 ENCOUNTER — Other Ambulatory Visit: Payer: Self-pay

## 2021-02-16 DIAGNOSIS — F331 Major depressive disorder, recurrent, moderate: Secondary | ICD-10-CM

## 2021-02-16 MED ORDER — VALBENAZINE TOSYLATE 80 MG PO CAPS: 80.0000 mg | ORAL_CAPSULE | Freq: Every day | ORAL | 1 refills | Status: AC

## 2021-02-16 MED ORDER — FLUOXETINE HCL 20 MG PO CAPS: ORAL_CAPSULE | ORAL | 1 refills | Status: DC

## 2021-02-16 NOTE — Patient Instructions (Addendum)
1. Continue fluoxetine 60 mg daily 2. Continue Latuda 20 mg daily  3. Increase valbenazine 80 mg daily  4. Next appointment: 10/19 at 8 AM

## 2021-02-22 ENCOUNTER — Ambulatory Visit: Payer: Medicare Other | Admitting: Urology

## 2021-02-24 ENCOUNTER — Other Ambulatory Visit (HOSPITAL_COMMUNITY): Payer: Self-pay | Admitting: Family Medicine

## 2021-02-24 DIAGNOSIS — Z1231 Encounter for screening mammogram for malignant neoplasm of breast: Secondary | ICD-10-CM

## 2021-03-07 ENCOUNTER — Other Ambulatory Visit: Payer: Self-pay

## 2021-03-07 ENCOUNTER — Ambulatory Visit (HOSPITAL_COMMUNITY)
Admission: RE | Admit: 2021-03-07 | Discharge: 2021-03-07 | Disposition: A | Discharge status: Home / Self Care | Payer: Medicare Other | Source: Ambulatory Visit | Attending: Family Medicine | Admitting: Family Medicine

## 2021-03-07 DIAGNOSIS — Z1231 Encounter for screening mammogram for malignant neoplasm of breast: Secondary | ICD-10-CM | POA: Insufficient documentation

## 2021-03-16 ENCOUNTER — Other Ambulatory Visit (HOSPITAL_COMMUNITY): Payer: Self-pay | Admitting: Family Medicine

## 2021-03-16 DIAGNOSIS — Z1231 Encounter for screening mammogram for malignant neoplasm of breast: Secondary | ICD-10-CM

## 2021-03-21 ENCOUNTER — Telehealth: Payer: Self-pay

## 2021-03-21 NOTE — Telephone Encounter (Signed)
If she has side effects, okay to stop.  We will recommend scheduling an appointment with her provider Dr.Hisada as soon as possible to discuss further treatment options.

## 2021-03-21 NOTE — Telephone Encounter (Signed)
pt left a message that she stopped her latuda because she felt like she was high. she stated that she was so drowsy.
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
## 2021-04-06 ENCOUNTER — Encounter: Payer: Self-pay | Admitting: Psychiatry

## 2021-04-06 ENCOUNTER — Other Ambulatory Visit: Payer: Self-pay

## 2021-04-06 ENCOUNTER — Telehealth (INDEPENDENT_AMBULATORY_CARE_PROVIDER_SITE_OTHER): Payer: Medicare Other | Admitting: Psychiatry

## 2021-04-06 DIAGNOSIS — G2401 Drug induced subacute dyskinesia: Secondary | ICD-10-CM | POA: Diagnosis not present

## 2021-04-06 DIAGNOSIS — F330 Major depressive disorder, recurrent, mild: Secondary | ICD-10-CM

## 2021-04-06 MED ORDER — LATUDA 20 MG PO TABS: 20.0000 mg | ORAL_TABLET | Freq: Every day | ORAL | 0 refills | Status: DC

## 2021-04-06 MED ORDER — VALBENAZINE TOSYLATE 40 MG PO CAPS: 40.0000 mg | ORAL_CAPSULE | Freq: Every day | ORAL | 1 refills | Status: DC

## 2021-04-06 MED ORDER — FLUOXETINE HCL 40 MG PO CAPS: 40.0000 mg | ORAL_CAPSULE | Freq: Every day | ORAL | 1 refills | Status: DC

## 2021-04-06 NOTE — Patient Instructions (Addendum)
1. Continue fluoxetine 60 mg daily 2. Continue Latuda 20 mg daily  3. Decrease valbenazine 40 mg daily  4. Next appointment:  12/5 at 1:30   The next visit will be in person visit. Please arrive 15 mins before the scheduled time.   [facility]  Address: [address], [place], [place] [address]
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
## 2021-05-23 ENCOUNTER — Ambulatory Visit (INDEPENDENT_AMBULATORY_CARE_PROVIDER_SITE_OTHER): Payer: Medicare Other | Admitting: Psychiatry

## 2021-05-23 ENCOUNTER — Encounter: Payer: Self-pay | Admitting: Psychiatry

## 2021-05-23 VITALS — BP 159/89 | HR 77 | Temp 98.5°F | Wt 195.8 lb

## 2021-05-23 DIAGNOSIS — G2401 Drug induced subacute dyskinesia: Secondary | ICD-10-CM

## 2021-05-23 DIAGNOSIS — F330 Major depressive disorder, recurrent, mild: Secondary | ICD-10-CM | POA: Diagnosis not present

## 2021-05-23 MED ORDER — LATUDA 20 MG PO TABS: 20.0000 mg | ORAL_TABLET | Freq: Every day | ORAL | 0 refills | Status: DC

## 2021-05-23 MED ORDER — DEUTETRABENAZINE 6 MG PO TABS: 6.0000 mg | ORAL_TABLET | Freq: Every day | ORAL | 1 refills | Status: DC

## 2021-05-23 NOTE — Patient Instructions (Signed)
Continue fluoxetine 60 mg daily Continue Latuda 20 mg daily  Start deutetrabenazine 6 mg daily Next appointment- 1/23 at 2 PM, in person The next visit will be in person visit. Please arrive 15 mins before the scheduled time.   [facility]  Address: [address], [place], [place] [address]

## 2021-05-24 ENCOUNTER — Telehealth: Payer: Self-pay

## 2021-05-24 NOTE — Telephone Encounter (Signed)
went online to [email or web address] and submitted the prior auth . - pending 

## 2021-05-24 NOTE — Telephone Encounter (Signed)
received message that prior auth PA = [id number] has been approved through 06-18-22

## 2021-05-24 NOTE — Telephone Encounter (Signed)
received fax requesting a prior auth for the deutetrabenazine.

## 2021-06-01 ENCOUNTER — Other Ambulatory Visit: Payer: Self-pay

## 2021-06-01 ENCOUNTER — Encounter: Payer: Self-pay | Admitting: Adult Health

## 2021-06-01 ENCOUNTER — Ambulatory Visit (INDEPENDENT_AMBULATORY_CARE_PROVIDER_SITE_OTHER): Payer: Medicare Other | Admitting: Adult Health

## 2021-06-01 VITALS — BP 153/79 | HR 68 | Ht 64.0 in | Wt 202.0 lb

## 2021-06-01 DIAGNOSIS — Z90710 Acquired absence of both cervix and uterus: Secondary | ICD-10-CM | POA: Diagnosis not present

## 2021-06-01 DIAGNOSIS — N3946 Mixed incontinence: Secondary | ICD-10-CM | POA: Diagnosis not present

## 2021-06-01 DIAGNOSIS — N8111 Cystocele, midline: Secondary | ICD-10-CM

## 2021-06-01 DIAGNOSIS — N3281 Overactive bladder: Secondary | ICD-10-CM | POA: Insufficient documentation

## 2021-06-01 DIAGNOSIS — N3945 Continuous leakage: Secondary | ICD-10-CM | POA: Diagnosis not present

## 2021-06-01 MED ORDER — GEMTESA 75 MG PO TABS: ORAL_TABLET | ORAL | 3 refills | Status: DC

## 2021-06-01 NOTE — Progress Notes (Signed)
Subjective:     Patient ID: Ashley Patrick, female   DOB: November 18, 1955, 65 y.o.   MRN: [id number]  HPI Ashley Patrick is a 65 year old black female,separated,sp hysterectomy in complaining of urinary leakage and having to pee often. She has appt with urologist, not sure when. She tried oxybutynin without any relief. She has had vaginal discharge, but not now, was treated with big blue pill, she says. PCP is Dr Maudie Mercury.  Review of Systems Has urinary leakage Has to pee often Will lose urine if coughs or sneezes or if has to go. No sex in over a year Reviewed past medical,surgical, social and family history. Reviewed medications and allergies.     Objective:   Physical Exam BP (!) 153/79 (BP Location: Left Arm, Patient Position: Sitting, Cuff Size: Normal)    Pulse 68    Ht 5\' 4"  (1.626 m)    Wt 202 lb (91.6 kg)    BMI 34.67 kg/m     Skin warm and dry.Pelvic: external genitalia is normal in appearance no lesions, vagina: pale with loss of rugae, +cystocele, urethra has no lesions or masses noted, cervix and uterus are absent,adnexa: no masses or tenderness noted. Bladder is non tender and no masses felt.  AA is 0 Fall risk is low Depression screen [facility] 2/9 06/01/2021 01/29/2017  Decreased Interest 1 3  Down, Depressed, Hopeless 2 1  PHQ - 2 Score 3 4  Altered sleeping 2 0  Tired, decreased energy 2 1  Change in appetite 2 1  Feeling bad or failure about yourself  1 2  Trouble concentrating 2 1  Moving slowly or fidgety/restless 1 1  Suicidal thoughts 1 0  PHQ-9 Score 14 10  Some encounter information is confidential and restricted. Go to Review Flowsheets activity to see all data.  Some recent data might be hidden   She is on meds GAD 7 : Generalized Anxiety Score 06/01/2021  Nervous, Anxious, on Edge 2  Control/stop worrying 1  Worry too much - different things 1  Trouble relaxing 1  Restless 2  Easily annoyed or irritable 1  Afraid - awful might happen 2  Total GAD 7 Score 10     Upstream - 06/01/21 1029       Pregnancy Intention Screening   Does the patient want to become pregnant in the next year? N/A    Does the patient's partner want to become pregnant in the next year? N/A    Would the patient like to discuss contraceptive options today? N/A      Contraception Wrap Up   Current Method Female Sterilization   post-menopausal   End Method Female Sterilization    Contraception Counseling Provided No            Examination chaperoned by Marcelino Scot RN   Assessment:     1. Mixed stress and urge urinary incontinence Will try Gemtesa, 7 tablets given Meds ordered this encounter  Medications   Vibegron (GEMTESA) 75 MG TABS    Sig: Take 1 daily    Dispense:  30 tablet    Refill:  3    Order Specific Question:   Supervising Provider    Answer:   Florian Buff [[id number]]    Keep urology appt.  2. Continuous leakage of urine   3. S/P hysterectomy   4. Cystocele, midline   5. OAB (overactive bladder)     Plan:     Follow up prn  Keep urology appt

## 2021-07-06 NOTE — H&P (View-Only) (Signed)
Referring Provider: Jani Gravel, MD Primary Care Physician:  Denyce Robert, FNP Primary GI Physician: Dr. Gala Romney  Chief Complaint  Patient presents with   Abdominal Pain    HPI:   Ashley Patrick is a 66 y.o. female with history of GERD, multiple adenomatous colon polyps, colon cancer (2014) s/p partial colectomy in January 2015 and not requiring adjuvant therapy, overdue for surveillance colonoscopy, presenting today for follow-up of abdominal pain and constipation.  Last seen in our office for urgent visit on 10/25/2020 for abdominal pain, constipation, and nausea/vomiting.  She had previously been seen in the ED on May 6 and 7 for constipation and abdominal pain.  She had a CT in the emergency room on 5/6 which did not show any acute findings.  She did have a moderate to large amount of stool throughout her colon. She reported constipation off and on x 8 months.  Reported taking something over-the-counter and tried prune juice.  She was passing a small amount of gravy consistency stool, but was not getting much relief.  Also with progressive abdominal pain, diffuse, severe, and nausea with vomiting.   Recommended ER evaluation.  She did not feel she could drive herself to the emergency room, so EMS was activated.  Notably, patient also reported she was going to kill herself if she did not get relief.  EMS took her to [facility].  Abdominal x-ray with retained fecal material.  CBC unremarkable.  CMP with potassium 3.4, creatinine 1.3, calcium 10.2.  Lipase 72.  Urine culture with more than 100,000 CFU mixed flora suggesting recollection if indicated.  She was given an enema in the emergency room and had a large bowel movement thereafter with improvement in her symptoms.  Recommended MiraLAX daily and outpatient follow-up.  Today:  Intermittent lower abdominal pain for over 1 year. Feels similar to when she had colon cancer, but on the right side. Occurs up to 5 days every month. Had a  hysterectomy but not sure if ovaries were removed. Also has a knot that swells in the RLQ at the same time of her pain. When the knot resolves, her pain resolves.  Reports that is about the size of a quarter.  She is not sure if she can see it, but she can feel it and it does not reduce to palpation.  States it is tender.  No erythema to the area.  No associated recent illness, cold or flulike symptoms, urinary symptoms, fever during that time. No nausea or vomiting.  Symptoms or not associated with bowel habits or meals. She will sit in the bath tub in hot water which gives her some relief. Last occurrence was last month.   No constipation. Resolved with drinking more water. Bms are daily. Soft. No blood in the stool or black stools.  No unintentional weight loss.   Heartburn about once a week. Taking omeprazole 20 mg daily.  No dysphagia.  Ibuprofen 200 mg every night for arthritis pain in knees and shoulder. Goody powders for HA, not very often. Less than once a week.    Last colonoscopy December 2017 with 2 polyps removed, but did not survive pathology processing.  Surgical anastomosis in the sigmoid colon noted, appeared patent, question somewhat narrowed just proximal anastomosis.  Follow-up barium enema for further evaluation of the anastomosis showed no stricture.  Next colonoscopy was planned for December 2020.     Past Medical History:  Diagnosis Date   Anxiety    Aortic atherosclerosis (  [place])    Arthritis    Back pain    Bipolar disorder ([place])    Cancer ([place]) 2014   Colon Cancer   Constipation    Depression    Hypercholesteremia    Hypertension    Insomnia    Lateral epicondylitis  of elbow    right   Migraine headache    Nicotine addiction    Obesity    History of   OD (overdose of drug)    hospitalized for 11 days    Panic attacks    Psychosis ([place])    Schizophrenia ([place])     Past Surgical History:  Procedure Laterality Date   ABDOMINAL HYSTERECTOMY     BIOPSY  N/A 08/13/2014   Procedure: BIOPSY;  Surgeon: Daneil Dolin, MD;  Location: [facility];  Service: Endoscopy;  Laterality: N/A;   BREAST BIOPSY  10/18/2011   Procedure: BREAST BIOPSY;  Surgeon: Jamesetta So, MD;  Location: [facility];  Service: General;  Laterality: Left;   COLON RESECTION N/A 06/23/2013   Procedure: HAND ASSISTED LAPAROSCOPIC PARTIAL COLECTOMY;  Surgeon: Jamesetta So, MD;  Location: [facility];  Service: General;  Laterality: N/A;   COLONOSCOPY     COLONOSCOPY N/A 05/29/2013   [id number] mass most likely representing colorectal cancer S/ P biospy.Multiple colonic and rectal polyps removed/treated as described above. Colonic diverticulosis   COLONOSCOPY WITH PROPOFOL N/A 08/13/2014   RMR: Status post sigmoid colectomy. Multiple colonic polyps removed as described above. Redundant colon. Pan colonic diverticuloisi   COLONOSCOPY WITH PROPOFOL N/A 05/22/2016   Surgeon: Daneil Dolin, MD; 2 polyps removed, but did not survive pathology processing.  Surgical anastomosis in the sigmoid colon noted, appeared patent, question somewhat narrowed just proximal anastomosis.  Repeat in 3 years.   ESOPHAGEAL DILATION N/A 08/13/2014   Procedure: [place];  Surgeon: Daneil Dolin, MD;  Location: [facility];  Service: Endoscopy;  Laterality: N/A;   ESOPHAGOGASTRODUODENOSCOPY N/A 12/26/2012   [id number] reflux esophagitis. Gastric and duodenal bulbar erosions-status post gastric biopsynegative H.pylori   ESOPHAGOGASTRODUODENOSCOPY (EGD) WITH PROPOFOL N/A 08/13/2014   RMR: Small benign cystic-appearing lesion in distal esophagus of doubtful clinical significance, otherwise normal esophagus, status post [place] dilation. Small hiatal hernia, some retained gastric contents (query delayed gastric emptying.)   partial hysterectomy     POLYPECTOMY N/A 08/13/2014   Procedure: POLYPECTOMY;  Surgeon: Daneil Dolin, MD;  Location: [facility];  Service: Endoscopy;  Laterality: N/A;     Current Outpatient Medications  Medication Sig Dispense Refill   acetaminophen (TYLENOL) 650 MG CR tablet Take 650 mg by mouth every 8 (eight) hours as needed for pain.     albuterol (VENTOLIN HFA) 108 (90 Base) MCG/ACT inhaler Inhale 2 puffs into the lungs every 4 (four) hours as needed.     aspirin EC 81 MG tablet Take 81 mg by mouth daily. Swallow whole.     Aspirin-Acetaminophen-Caffeine (GOODY HEADACHE PO) Take by mouth.     Deutetrabenazine 6 MG TABS Take 6 mg by mouth daily. 30 tablet 1   FLUoxetine (PROZAC) 40 MG capsule Take 1 capsule (40 mg total) by mouth daily. 60 mg daily. Take along with 20 mg cap 90 capsule 1   hydrochlorothiazide (HYDRODIURIL) 25 MG tablet Take 1 tablet by mouth daily.     hydrOXYzine (ATARAX/VISTARIL) 25 MG tablet Take 25 mg by mouth every 8 (eight) hours.     ibuprofen (ADVIL) 200 MG tablet Take 200 mg by mouth  every 6 (six) hours as needed. 200 mg every night     loratadine (CLARITIN) 10 MG tablet Take 10 mg by mouth daily.     lurasidone (LATUDA) 20 MG TABS tablet Take 1 tablet (20 mg total) by mouth daily. 90 tablet 0   olmesartan (BENICAR) 5 MG tablet Take 10 mg by mouth daily.     omeprazole (PRILOSEC) 20 MG capsule Take 1 capsule by mouth daily.     SPIRIVA RESPIMAT 1.25 MCG/ACT AERS Inhale 1 puff into the lungs daily.     traZODone (DESYREL) 150 MG tablet Take 300 mg by mouth at bedtime.     Vibegron (GEMTESA) 75 MG TABS Take 1 daily 30 tablet 3   No current facility-administered medications for this visit.    Allergies as of 07/07/2021   (No Known Allergies)    Family History  Problem Relation Age of Onset   Stroke Mother    Drug abuse Mother    Arthritis Mother    Arthritis Father    Colon cancer Father        diagnosed in his 21s   Drug abuse Father    Hypertension Sister    Asthma Sister    HIV/AIDS Brother    Colon cancer Paternal Uncle        38s   Anesthesia problems Neg Hx    Hypotension Neg Hx    Malignant hyperthermia  Neg Hx    Pseudochol deficiency Neg Hx     Social History   Socioeconomic History   Marital status: Legally Separated    Spouse name: Not on file   Number of children: 3   Years of education: Not on file   Highest education level: Not on file  Occupational History   Not on file  Tobacco Use   Smoking status: Every Day    Packs/day: 0.50    Years: 38.00    Pack years: 19.00    Types: Cigarettes    Last attempt to quit: 06/19/2014    Years since quitting: 7.0   Smokeless tobacco: Never   Tobacco comments:    vapor only  Vaping Use   Vaping Use: Never used  Substance and Sexual Activity   Alcohol use: No    Alcohol/week: 0.0 standard drinks   Drug use: No   Sexual activity: Not Currently    Birth control/protection: Surgical    Comment: hyst  Other Topics Concern   Not on file  Social History Narrative   Not on file   Social Determinants of Health   Financial Resource Strain: Low Risk    Difficulty of Paying Living Expenses: Not very hard  Food Insecurity: No Food Insecurity   Worried About Charity fundraiser in the Last Year: Never true   Ran Out of Food in the Last Year: Never true  Transportation Needs: No Transportation Needs   Lack of Transportation (Medical): No   Lack of Transportation (Non-Medical): No  Physical Activity: Insufficiently Active   Days of Exercise per Week: 1 day   Minutes of Exercise per Session: 10 min  Stress: No Stress Concern Present   Feeling of Stress : Only a little  Social Connections: Moderately Isolated   Frequency of Communication with Friends and Family: Twice a week   Frequency of Social Gatherings with Friends and Family: Once a week   Attends Religious Services: 1 to 4 times per year   Active Member of [employer] or Organizations: No   Attends [employer]  or Organization Meetings: Never   Marital Status: Separated    Review of Systems: Gen: Denies fever, cold or flulike symptoms, presyncope, syncope.  CV: Denies chest pain,  palpitations. Resp: Admits to shortness of breath in setting of panic attacks, otherwise no shortness of breath or cough. GI: See HPI Heme: See HPI  Physical Exam: BP (!) 164/93    Pulse 86    Temp (!) 97.3 F (36.3 C) (Temporal)    Ht 5\' 4"  (1.626 m)    Wt 205 lb 9.6 oz (93.3 kg)    BMI 35.29 kg/m  General:   Alert and oriented. No distress noted. Pleasant and cooperative.  Head:  Normocephalic and atraumatic. Eyes:  Conjuctiva clear without scleral icterus. Heart:  S1, S2 present without murmurs appreciated. Lungs:  Clear to auscultation bilaterally. No wheezes, rales, or rhonchi. No distress.  Abdomen:  +BS, soft, and non-distended. Mild TTP in the epigastric area. Minimal TTP/"pressure" in the mid low abdomen and LLQ without rebound or guarding. I am not able to appreciate any mass/knot lesion in the RLQ. No HSM or masses noted. Msk:  Symmetrical without gross deformities. Normal posture. Extremities:  Without edema. Neurologic:  Alert and  oriented x4 Psych: Normal mood and affect.    Assessment: 66 year old female with history of GERD, multiple adenomatous colon polyps, colon cancer (2014) s/p partial colectomy in January 2015 and not requiring adjuvant therapy, overdue for surveillance colonoscopy, presenting today for follow-up of abdominal pain and constipation.  Also discussed GERD.  Abdominal pain: Greater than 1 year history of intermittent lower abdominal pain, primarily in the right lower quadrant/at the lower abdominal fold with associated intermittent tender "knot" about the size of a quarter in this area without erythema per patient. The "knot" is non-reducible at that time and resolves on its own. Symptoms occurs about once a month and will last max of 5 days.  Not associated with meals or bowel habits, viral illness, or urinary symptoms.  Gets some relief with sitting in warm water. No unintentional weight loss. History of partial hysterectomy and partial colectomy. On  exam, I am unable to appreciate any mass/knot in the reported area.  She does have very minimal TTP/" pressure" in the mid low abdomen and LLQ without rebound or guarding.  CT scan in May 2022 with no significant abnormalities, no adnexal masses or evidence of abdominal wall mass or hernia.  Noted postsurgical changes in the low anterior abdominal wall.  Etiology is unclear. Query whether she may have an internal hernia. Advised she monitor for recurrence and we can consider repeat imaging at that time. May need surgical consult.   Constipation:  Resolved with increasing water intake.   History of colon cancer and adenomatous colon polyps:  Overdue for surveillance colonoscopy.  Last colonoscopy was in December 2017 with 2 polyps removed that did not survive pathology processing, surgical anastomosis in sigmoid colon appeared patent with question of some narrowing just proximal to anastomosis.  Follow-up barium enema for further evaluation of anastomosis showed no stricture.  She was due for repeat colonoscopy in December 2020.  Denies BRBPR, melena, unintentional weight loss.  She does have some intermittent lower abdominal pain as described above.  Family history significant for father and paternal uncle with colon cancer in their 37s.  We will plan to proceed with colonoscopy in the near future.  She is agreeable for Dr. Abbey Chatters to perform her procedure in the absence of Dr. Gala Romney.  GERD: Fairly well controlled with  omeprazole 20 mg daily.  Breakthrough symptoms about once a week.  Use of ibuprofen routinely and occasional Goody powders may be influencing her symptoms.  No alarm symptoms.   Plan:  Proceed with colonoscopy with propofol with Dr. Abbey Chatters (in Dr. Roseanne Kaufman absence) in the near future. The risks, benefits, and alternatives have been discussed with the patient in detail. The patient states understanding and desires to proceed. ASA 3 Monitor for recurrent abdominal pain and let us know if  this occurs.  Consider repeat CT with contrast for further evaluation at that time.  May need to consider surgical referral for evaluation of possible internal hernia if imaging is unrevealing.  Continue omeprazole 20 mg daily 30 minutes before breakfast. Reinforced GERD diet/lifestyle.  Separate instructions provided. Limit ibuprofen as much as possible and utilize Tylenol first for arthritis pain.  Also advise she talk with PCP about Voltaren gel.  Also recommended discontinuing Goody powders and avoiding all other NSAID products. Follow-up after colonoscopy.   Aliene Altes, PA-C [facility] 07/07/2021

## 2021-07-06 NOTE — Progress Notes (Signed)
Referring Provider: Jani Gravel, MD Primary Care Physician:  Denyce Robert, FNP Primary GI Physician: Dr. Gala Romney  Chief Complaint  Patient presents with   Abdominal Pain    HPI:   Ashley Patrick is a 66 y.o. female with history of GERD, multiple adenomatous colon polyps, colon cancer (2014) s/p partial colectomy in January 2015 and not requiring adjuvant therapy, overdue for surveillance colonoscopy, presenting today for follow-up of abdominal pain and constipation.  Last seen in our office for urgent visit on 10/25/2020 for abdominal pain, constipation, and nausea/vomiting.  She had previously been seen in the ED on May 6 and 7 for constipation and abdominal pain.  She had a CT in the emergency room on 5/6 which did not show any acute findings.  She did have a moderate to large amount of stool throughout her colon. She reported constipation off and on x 8 months.  Reported taking something over-the-counter and tried prune juice.  She was passing a small amount of gravy consistency stool, but was not getting much relief.  Also with progressive abdominal pain, diffuse, severe, and nausea with vomiting.   Recommended ER evaluation.  She did not feel she could drive herself to the emergency room, so EMS was activated.  Notably, patient also reported she was going to kill herself if she did not get relief.  EMS took her to [facility].  Abdominal x-ray with retained fecal material.  CBC unremarkable.  CMP with potassium 3.4, creatinine 1.3, calcium 10.2.  Lipase 72.  Urine culture with more than 100,000 CFU mixed flora suggesting recollection if indicated.  She was given an enema in the emergency room and had a large bowel movement thereafter with improvement in her symptoms.  Recommended MiraLAX daily and outpatient follow-up.  Today:  Intermittent lower abdominal pain for over 1 year. Feels similar to when she had colon cancer, but on the right side. Occurs up to 5 days every month. Had a  hysterectomy but not sure if ovaries were removed. Also has a knot that swells in the RLQ at the same time of her pain. When the knot resolves, her pain resolves.  Reports that is about the size of a quarter.  She is not sure if she can see it, but she can feel it and it does not reduce to palpation.  States it is tender.  No erythema to the area.  No associated recent illness, cold or flulike symptoms, urinary symptoms, fever during that time. No nausea or vomiting.  Symptoms or not associated with bowel habits or meals. She will sit in the bath tub in hot water which gives her some relief. Last occurrence was last month.   No constipation. Resolved with drinking more water. Bms are daily. Soft. No blood in the stool or black stools.  No unintentional weight loss.   Heartburn about once a week. Taking omeprazole 20 mg daily.  No dysphagia.  Ibuprofen 200 mg every night for arthritis pain in knees and shoulder. Goody powders for HA, not very often. Less than once a week.    Last colonoscopy December 2017 with 2 polyps removed, but did not survive pathology processing.  Surgical anastomosis in the sigmoid colon noted, appeared patent, question somewhat narrowed just proximal anastomosis.  Follow-up barium enema for further evaluation of the anastomosis showed no stricture.  Next colonoscopy was planned for December 2020.     Past Medical History:  Diagnosis Date   Anxiety    Aortic atherosclerosis (  [place])    Arthritis    Back pain    Bipolar disorder ([place])    Cancer ([place]) 2014   Colon Cancer   Constipation    Depression    Hypercholesteremia    Hypertension    Insomnia    Lateral epicondylitis  of elbow    right   Migraine headache    Nicotine addiction    Obesity    History of   OD (overdose of drug)    hospitalized for 11 days    Panic attacks    Psychosis ([place])    Schizophrenia ([place])     Past Surgical History:  Procedure Laterality Date   ABDOMINAL HYSTERECTOMY     BIOPSY  N/A 08/13/2014   Procedure: BIOPSY;  Surgeon: Daneil Dolin, MD;  Location: [facility];  Service: Endoscopy;  Laterality: N/A;   BREAST BIOPSY  10/18/2011   Procedure: BREAST BIOPSY;  Surgeon: Jamesetta So, MD;  Location: [facility];  Service: General;  Laterality: Left;   COLON RESECTION N/A 06/23/2013   Procedure: HAND ASSISTED LAPAROSCOPIC PARTIAL COLECTOMY;  Surgeon: Jamesetta So, MD;  Location: [facility];  Service: General;  Laterality: N/A;   COLONOSCOPY     COLONOSCOPY N/A 05/29/2013   QVZ:DGLOVFI mass most likely representing colorectal cancer S/ P biospy.Multiple colonic and rectal polyps removed/treated as described above. Colonic diverticulosis   COLONOSCOPY WITH PROPOFOL N/A 08/13/2014   RMR: Status post sigmoid colectomy. Multiple colonic polyps removed as described above. Redundant colon. Pan colonic diverticuloisi   COLONOSCOPY WITH PROPOFOL N/A 05/22/2016   Surgeon: Daneil Dolin, MD; 2 polyps removed, but did not survive pathology processing.  Surgical anastomosis in the sigmoid colon noted, appeared patent, question somewhat narrowed just proximal anastomosis.  Repeat in 3 years.   ESOPHAGEAL DILATION N/A 08/13/2014   Procedure: [place];  Surgeon: Daneil Dolin, MD;  Location: [facility];  Service: Endoscopy;  Laterality: N/A;   ESOPHAGOGASTRODUODENOSCOPY N/A 12/26/2012   EPP:IRJJOAC reflux esophagitis. Gastric and duodenal bulbar erosions-status post gastric biopsynegative H.pylori   ESOPHAGOGASTRODUODENOSCOPY (EGD) WITH PROPOFOL N/A 08/13/2014   RMR: Small benign cystic-appearing lesion in distal esophagus of doubtful clinical significance, otherwise normal esophagus, status post [place] dilation. Small hiatal hernia, some retained gastric contents (query delayed gastric emptying.)   partial hysterectomy     POLYPECTOMY N/A 08/13/2014   Procedure: POLYPECTOMY;  Surgeon: Daneil Dolin, MD;  Location: [facility];  Service: Endoscopy;  Laterality: N/A;     Current Outpatient Medications  Medication Sig Dispense Refill   acetaminophen (TYLENOL) 650 MG CR tablet Take 650 mg by mouth every 8 (eight) hours as needed for pain.     albuterol (VENTOLIN HFA) 108 (90 Base) MCG/ACT inhaler Inhale 2 puffs into the lungs every 4 (four) hours as needed.     aspirin EC 81 MG tablet Take 81 mg by mouth daily. Swallow whole.     Aspirin-Acetaminophen-Caffeine (GOODY HEADACHE PO) Take by mouth.     Deutetrabenazine 6 MG TABS Take 6 mg by mouth daily. 30 tablet 1   FLUoxetine (PROZAC) 40 MG capsule Take 1 capsule (40 mg total) by mouth daily. 60 mg daily. Take along with 20 mg cap 90 capsule 1   hydrochlorothiazide (HYDRODIURIL) 25 MG tablet Take 1 tablet by mouth daily.     hydrOXYzine (ATARAX/VISTARIL) 25 MG tablet Take 25 mg by mouth every 8 (eight) hours.     ibuprofen (ADVIL) 200 MG tablet Take 200 mg by mouth  every 6 (six) hours as needed. 200 mg every night     loratadine (CLARITIN) 10 MG tablet Take 10 mg by mouth daily.     lurasidone (LATUDA) 20 MG TABS tablet Take 1 tablet (20 mg total) by mouth daily. 90 tablet 0   olmesartan (BENICAR) 5 MG tablet Take 10 mg by mouth daily.     omeprazole (PRILOSEC) 20 MG capsule Take 1 capsule by mouth daily.     SPIRIVA RESPIMAT 1.25 MCG/ACT AERS Inhale 1 puff into the lungs daily.     traZODone (DESYREL) 150 MG tablet Take 300 mg by mouth at bedtime.     Vibegron (GEMTESA) 75 MG TABS Take 1 daily 30 tablet 3   No current facility-administered medications for this visit.    Allergies as of 07/07/2021   (No Known Allergies)    Family History  Problem Relation Age of Onset   Stroke Mother    Drug abuse Mother    Arthritis Mother    Arthritis Father    Colon cancer Father        diagnosed in his 79s   Drug abuse Father    Hypertension Sister    Asthma Sister    HIV/AIDS Brother    Colon cancer Paternal Uncle        61s   Anesthesia problems Neg Hx    Hypotension Neg Hx    Malignant hyperthermia  Neg Hx    Pseudochol deficiency Neg Hx     Social History   Socioeconomic History   Marital status: Legally Separated    Spouse name: Not on file   Number of children: 3   Years of education: Not on file   Highest education level: Not on file  Occupational History   Not on file  Tobacco Use   Smoking status: Every Day    Packs/day: 0.50    Years: 38.00    Pack years: 19.00    Types: Cigarettes    Last attempt to quit: 06/19/2014    Years since quitting: 7.0   Smokeless tobacco: Never   Tobacco comments:    vapor only  Vaping Use   Vaping Use: Never used  Substance and Sexual Activity   Alcohol use: No    Alcohol/week: 0.0 standard drinks   Drug use: No   Sexual activity: Not Currently    Birth control/protection: Surgical    Comment: hyst  Other Topics Concern   Not on file  Social History Narrative   Not on file   Social Determinants of Health   Financial Resource Strain: Low Risk    Difficulty of Paying Living Expenses: Not very hard  Food Insecurity: No Food Insecurity   Worried About Charity fundraiser in the Last Year: Never true   Ran Out of Food in the Last Year: Never true  Transportation Needs: No Transportation Needs   Lack of Transportation (Medical): No   Lack of Transportation (Non-Medical): No  Physical Activity: Insufficiently Active   Days of Exercise per Week: 1 day   Minutes of Exercise per Session: 10 min  Stress: No Stress Concern Present   Feeling of Stress : Only a little  Social Connections: Moderately Isolated   Frequency of Communication with Friends and Family: Twice a week   Frequency of Social Gatherings with Friends and Family: Once a week   Attends Religious Services: 1 to 4 times per year   Active Member of [employer] or Organizations: No   Attends [employer]  or Organization Meetings: Never   Marital Status: Separated    Review of Systems: Gen: Denies fever, cold or flulike symptoms, presyncope, syncope.  CV: Denies chest pain,  palpitations. Resp: Admits to shortness of breath in setting of panic attacks, otherwise no shortness of breath or cough. GI: See HPI Heme: See HPI  Physical Exam: BP (!) 164/93    Pulse 86    Temp (!) 97.3 F (36.3 C) (Temporal)    Ht 5\' 4"  (1.626 m)    Wt 205 lb 9.6 oz (93.3 kg)    BMI 35.29 kg/m  General:   Alert and oriented. No distress noted. Pleasant and cooperative.  Head:  Normocephalic and atraumatic. Eyes:  Conjuctiva clear without scleral icterus. Heart:  S1, S2 present without murmurs appreciated. Lungs:  Clear to auscultation bilaterally. No wheezes, rales, or rhonchi. No distress.  Abdomen:  +BS, soft, and non-distended. Mild TTP in the epigastric area. Minimal TTP/"pressure" in the mid low abdomen and LLQ without rebound or guarding. I am not able to appreciate any mass/knot lesion in the RLQ. No HSM or masses noted. Msk:  Symmetrical without gross deformities. Normal posture. Extremities:  Without edema. Neurologic:  Alert and  oriented x4 Psych: Normal mood and affect.    Assessment: 66 year old female with history of GERD, multiple adenomatous colon polyps, colon cancer (2014) s/p partial colectomy in January 2015 and not requiring adjuvant therapy, overdue for surveillance colonoscopy, presenting today for follow-up of abdominal pain and constipation.  Also discussed GERD.  Abdominal pain: Greater than 1 year history of intermittent lower abdominal pain, primarily in the right lower quadrant/at the lower abdominal fold with associated intermittent tender "knot" about the size of a quarter in this area without erythema per patient. The "knot" is non-reducible at that time and resolves on its own. Symptoms occurs about once a month and will last max of 5 days.  Not associated with meals or bowel habits, viral illness, or urinary symptoms.  Gets some relief with sitting in warm water. No unintentional weight loss. History of partial hysterectomy and partial colectomy. On  exam, I am unable to appreciate any mass/knot in the reported area.  She does have very minimal TTP/" pressure" in the mid low abdomen and LLQ without rebound or guarding.  CT scan in May 2022 with no significant abnormalities, no adnexal masses or evidence of abdominal wall mass or hernia.  Noted postsurgical changes in the low anterior abdominal wall.  Etiology is unclear. Query whether she may have an internal hernia. Advised she monitor for recurrence and we can consider repeat imaging at that time. May need surgical consult.   Constipation:  Resolved with increasing water intake.   History of colon cancer and adenomatous colon polyps:  Overdue for surveillance colonoscopy.  Last colonoscopy was in December 2017 with 2 polyps removed that did not survive pathology processing, surgical anastomosis in sigmoid colon appeared patent with question of some narrowing just proximal to anastomosis.  Follow-up barium enema for further evaluation of anastomosis showed no stricture.  She was due for repeat colonoscopy in December 2020.  Denies BRBPR, melena, unintentional weight loss.  She does have some intermittent lower abdominal pain as described above.  Family history significant for father and paternal uncle with colon cancer in their 55s.  We will plan to proceed with colonoscopy in the near future.  She is agreeable for Dr. Abbey Chatters to perform her procedure in the absence of Dr. Gala Romney.  GERD: Fairly well controlled with  omeprazole 20 mg daily.  Breakthrough symptoms about once a week.  Use of ibuprofen routinely and occasional Goody powders may be influencing her symptoms.  No alarm symptoms.   Plan:  Proceed with colonoscopy with propofol with Dr. Abbey Chatters (in Dr. Roseanne Kaufman absence) in the near future. The risks, benefits, and alternatives have been discussed with the patient in detail. The patient states understanding and desires to proceed. ASA 3 Monitor for recurrent abdominal pain and let us know if  this occurs.  Consider repeat CT with contrast for further evaluation at that time.  May need to consider surgical referral for evaluation of possible internal hernia if imaging is unrevealing.  Continue omeprazole 20 mg daily 30 minutes before breakfast. Reinforced GERD diet/lifestyle.  Separate instructions provided. Limit ibuprofen as much as possible and utilize Tylenol first for arthritis pain.  Also advise she talk with PCP about Voltaren gel.  Also recommended discontinuing Goody powders and avoiding all other NSAID products. Follow-up after colonoscopy.   Aliene Altes, PA-C [facility] 07/07/2021

## 2021-07-07 ENCOUNTER — Ambulatory Visit (INDEPENDENT_AMBULATORY_CARE_PROVIDER_SITE_OTHER): Payer: Medicare Other | Admitting: Gastroenterology

## 2021-07-07 ENCOUNTER — Encounter: Payer: Self-pay | Admitting: Gastroenterology

## 2021-07-07 ENCOUNTER — Other Ambulatory Visit: Payer: Self-pay

## 2021-07-07 ENCOUNTER — Telehealth: Payer: Self-pay

## 2021-07-07 VITALS — BP 164/93 | HR 86 | Temp 97.3°F | Ht 64.0 in | Wt 205.6 lb

## 2021-07-07 DIAGNOSIS — R1030 Lower abdominal pain, unspecified: Secondary | ICD-10-CM | POA: Diagnosis not present

## 2021-07-07 DIAGNOSIS — Z86010 Personal history of colonic polyps: Secondary | ICD-10-CM

## 2021-07-07 DIAGNOSIS — K219 Gastro-esophageal reflux disease without esophagitis: Secondary | ICD-10-CM | POA: Diagnosis not present

## 2021-07-07 DIAGNOSIS — Z85038 Personal history of other malignant neoplasm of large intestine: Secondary | ICD-10-CM | POA: Diagnosis not present

## 2021-07-07 DIAGNOSIS — K5900 Constipation, unspecified: Secondary | ICD-10-CM

## 2021-07-07 MED ORDER — CLENPIQ 10-3.5-12 MG-GM -GM/160ML PO SOLN: 1.0000 | Freq: Once | ORAL | 0 refills | Status: AC

## 2021-07-07 NOTE — Telephone Encounter (Signed)
PA for TCS submitted via [facility] website. PA# [id number], valid 08/05/21-11/03/21.

## 2021-07-07 NOTE — Patient Instructions (Addendum)
We will arrange for you to have a colonoscopy in the near future with Dr. Abbey Chatters.  Monitor for recurrent lower abdominal pain/recurrent "knot" in the lower abdomen.  Let us know if your pain returns and we can consider ordering repeat imaging at that time for further evaluation.   Continue taking omeprazole 20 mg daily 30 minutes before breakfast.  Follow a GERD diet:  Avoid fried, fatty, greasy, spicy, citrus foods. Avoid caffeine and carbonated beverages. Avoid chocolate. Try eating 4-6 small meals a day rather than 3 large meals. Do not eat within 3 hours of laying down. Prop head of bed up on wood or bricks to create a 6 inch incline.  Limit ibuprofen is much as possible and utilize Tylenol first for arthritis pain.  You may also talk with your doctor about a prescription for Voltaren gel for arthritis.  I also recommend you avoid Goody powders and all other NSAID products including Aleve, Advil, BC powders as these medications can cause inflammation in your GI tract.  We will follow-up with you in the office after your colonoscopy.  Do not hesitate to call if you have any questions or concerns prior to next visit.  Aliene Altes, PA-C [facility]
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
## 2021-07-11 ENCOUNTER — Encounter: Payer: Self-pay | Admitting: Psychiatry

## 2021-07-11 ENCOUNTER — Ambulatory Visit (INDEPENDENT_AMBULATORY_CARE_PROVIDER_SITE_OTHER): Payer: Medicare Other | Admitting: Psychiatry

## 2021-07-11 ENCOUNTER — Other Ambulatory Visit: Payer: Self-pay

## 2021-07-11 VITALS — BP 162/105 | HR 84 | Temp 97.3°F | Wt 212.4 lb

## 2021-07-11 DIAGNOSIS — G2401 Drug induced subacute dyskinesia: Secondary | ICD-10-CM

## 2021-07-11 DIAGNOSIS — F3341 Major depressive disorder, recurrent, in partial remission: Secondary | ICD-10-CM | POA: Diagnosis not present

## 2021-07-11 MED ORDER — TETRABENAZINE 12.5 MG PO TABS: 12.5000 mg | ORAL_TABLET | Freq: Two times a day (BID) | ORAL | 1 refills | Status: DC

## 2021-07-11 NOTE — Patient Instructions (Addendum)
Continue fluoxetine 60 mg daily Continue Latuda 20 mg daily  Discontinue deutetrabenazine  Start tetrabenazine 12.5 mg twice a day Next appointment- 2/27 at 1:30, in person

## 2021-07-12 ENCOUNTER — Telehealth: Payer: Self-pay

## 2021-07-12 NOTE — Telephone Encounter (Signed)
pt called states that she been to the pharmacy twice and they don't have the rx.

## 2021-07-12 NOTE — Telephone Encounter (Signed)
went online and submitted the prior auth for the tetrabenazine-  pending

## 2021-07-12 NOTE — Telephone Encounter (Signed)
called pharmacy they states for the [place] her insurance will only pay for a 30 day supply so the rx that was sent for 90 day they filled for 30 and gave her 2 refills.  they will go ahead and get that ready.  the new medication tetrabenazine needs a prior auth.  Ask pharmacist to send prior auth info to [email or web address] and I will get it done before I leave today.

## 2021-07-12 NOTE — Telephone Encounter (Signed)
left message that pharmacy is getting the latuda ready but that the new medication needs a prior auth with the insurance company.  I left message that i just submitted the prior auth to her insurance and hopefully they will responde by tomorrow and once I hear back will call and let her know.

## 2021-07-13 NOTE — Telephone Encounter (Signed)
states that it was approved and has already been delivered.

## 2021-08-01 NOTE — Patient Instructions (Signed)
Ashley Patrick  08/01/2021     @PREFPERIOPPHARMACY @   Your procedure is scheduled on  08/05/2021.   Report to [facility] at  0830 A.M.   Call this number if you have problems the morning of surgery:  [phone]   Remember:  Follow the diet and prep instructions given to you by the office.    Use your inhalers before you come and bring your rescue inhaler with you.    Take these medicines the morning of surgery with A SIP OF WATER          prozac, hydroxyzine, latuda, prilosec, xenazine.     Do not wear jewelry, make-up or nail polish.  Do not wear lotions, powders, or perfumes, or deodorant.  Do not shave 48 hours prior to surgery.  Men may shave face and neck.  Do not bring valuables to the hospital.  [facility] is not responsible for any belongings or valuables.  Contacts, dentures or bridgework may not be worn into surgery.  Leave your suitcase in the car.  After surgery it may be brought to your room.  For patients admitted to the hospital, discharge time will be determined by your treatment team.  Patients discharged the day of surgery will not be allowed to drive home and must have someone with them for 24 hours.    Special instructions:   DO NOT smoke tobacco or vape for 24 hours before your procedure.  Please read over the following fact sheets that you were given. Anesthesia Post-op Instructions and Care and Recovery After Surgery      Colonoscopy, Adult, Care After This sheet gives you information about how to care for yourself after your procedure. Your health care provider may also give you more specific instructions. If you have problems or questions, contact your health care provider. What can I expect after the procedure? After the procedure, it is common to have: A small amount of blood in your stool for 24 hours after the procedure. Some gas. Mild cramping or bloating of your abdomen. Follow these instructions at home: Eating and  drinking  Drink enough fluid to keep your urine pale yellow. Follow instructions from your health care provider about eating or drinking restrictions. Resume your normal diet as instructed by your health care provider. Avoid heavy or fried foods that are hard to digest. Activity Rest as told by your health care provider. Avoid sitting for a long time without moving. Get up to take short walks every 1-2 hours. This is important to improve blood flow and breathing. Ask for help if you feel weak or unsteady. Return to your normal activities as told by your health care provider. Ask your health care provider what activities are safe for you. Managing cramping and bloating  Try walking around when you have cramps or feel bloated. Apply heat to your abdomen as told by your health care provider. Use the heat source that your health care provider recommends, such as a moist heat pack or a heating pad. Place a towel between your skin and the heat source. Leave the heat on for 20-30 minutes. Remove the heat if your skin turns bright red. This is especially important if you are unable to feel pain, heat, or cold. You may have a greater risk of getting burned. General instructions If you were given a sedative during the procedure, it can affect you for several hours. Do not drive or operate machinery until your health  care provider says that it is safe. For the first 24 hours after the procedure: Do not sign important documents. Do not drink alcohol. Do your regular daily activities at a slower pace than normal. Eat soft foods that are easy to digest. Take over-the-counter and prescription medicines only as told by your health care provider. Keep all follow-up visits as told by your health care provider. This is important. Contact a health care provider if: You have blood in your stool 2-3 days after the procedure. Get help right away if you have: More than a small spotting of blood in your  stool. Large blood clots in your stool. Swelling of your abdomen. Nausea or vomiting. A fever. Increasing pain in your abdomen that is not relieved with medicine. Summary After the procedure, it is common to have a small amount of blood in your stool. You may also have mild cramping and bloating of your abdomen. If you were given a sedative during the procedure, it can affect you for several hours. Do not drive or operate machinery until your health care provider says that it is safe. Get help right away if you have a lot of blood in your stool, nausea or vomiting, a fever, or increased pain in your abdomen. This information is not intended to replace advice given to you by your health care provider. Make sure you discuss any questions you have with your health care provider. Document Revised: 04/11/2019 Document Reviewed: 12/30/2018 Elsevier Patient Education  [place] After This sheet gives you information about how to care for yourself after your procedure. Your health care provider may also give you more specific instructions. If you have problems or questions, contact your health care provider. What can I expect after the procedure? After the procedure, it is common to have: Tiredness. Forgetfulness about what happened after the procedure. Impaired judgment for important decisions. Nausea or vomiting. Some difficulty with balance. Follow these instructions at home: For the time period you were told by your health care provider:   Rest as needed. Do not participate in activities where you could fall or become injured. Do not drive or use machinery. Do not drink alcohol. Do not take sleeping pills or medicines that cause drowsiness. Do not make important decisions or sign legal documents. Do not take care of children on your own. Eating and drinking Follow the diet that is recommended by your health care provider. Drink enough fluid to  keep your urine pale yellow. If you vomit: Drink water, juice, or soup when you can drink without vomiting. Make sure you have little or no nausea before eating solid foods. General instructions Have a responsible adult stay with you for the time you are told. It is important to have someone help care for you until you are awake and alert. Take over-the-counter and prescription medicines only as told by your health care provider. If you have sleep apnea, surgery and certain medicines can increase your risk for breathing problems. Follow instructions from your health care provider about wearing your sleep device: Anytime you are sleeping, including during daytime naps. While taking prescription pain medicines, sleeping medicines, or medicines that make you drowsy. Avoid smoking. Keep all follow-up visits as told by your health care provider. This is important. Contact a health care provider if: You keep feeling nauseous or you keep vomiting. You feel light-headed. You are still sleepy or having trouble with balance after 24 hours. You develop a rash. You have  a fever. You have redness or swelling around the IV site. Get help right away if: You have trouble breathing. You have new-onset confusion at home. Summary For several hours after your procedure, you may feel tired. You may also be forgetful and have poor judgment. Have a responsible adult stay with you for the time you are told. It is important to have someone help care for you until you are awake and alert. Rest as told. Do not drive or operate machinery. Do not drink alcohol or take sleeping pills. Get help right away if you have trouble breathing, or if you suddenly become confused. This information is not intended to replace advice given to you by your health care provider. Make sure you discuss any questions you have with your health care provider. Document Revised: 02/19/2020 Document Reviewed: 05/08/2019 Elsevier Patient  Education  2022 [employer].

## 2021-08-02 ENCOUNTER — Encounter (HOSPITAL_COMMUNITY)
Admission: RE | Admit: 2021-08-02 | Discharge: 2021-08-02 | Disposition: A | Discharge status: Home / Self Care | Payer: Medicare Other | Source: Ambulatory Visit | Attending: Internal Medicine | Admitting: Internal Medicine

## 2021-08-02 ENCOUNTER — Encounter (HOSPITAL_COMMUNITY): Payer: Self-pay

## 2021-08-02 VITALS — BP 115/74 | HR 95 | Temp 97.6°F | Resp 18 | Ht 64.0 in | Wt 212.0 lb

## 2021-08-02 DIAGNOSIS — Z79899 Other long term (current) drug therapy: Secondary | ICD-10-CM | POA: Diagnosis not present

## 2021-08-02 DIAGNOSIS — Z01812 Encounter for preprocedural laboratory examination: Secondary | ICD-10-CM | POA: Diagnosis present

## 2021-08-02 HISTORY — DX: Dyspnea, unspecified: R06.00

## 2021-08-02 HISTORY — DX: Unspecified asthma, uncomplicated: J45.909

## 2021-08-02 LAB — BASIC METABOLIC PANEL
Anion gap: 12 (ref 5–15)
BUN: 21 mg/dL (ref 8–23)
CO2: 24 mmol/L (ref 22–32)
Calcium: 9.5 mg/dL (ref 8.9–10.3)
Chloride: 102 mmol/L (ref 98–111)
Creatinine, Ser: 1.04 mg/dL — ABNORMAL HIGH (ref 0.44–1.00)
GFR, Estimated: >60 mL/min (ref >60)
Glucose, Bld: 116 mg/dL — ABNORMAL HIGH (ref 70–99)
Potassium: 3.5 mmol/L (ref 3.5–5.1)
Sodium: 138 mmol/L (ref 135–145)

## 2021-08-05 ENCOUNTER — Ambulatory Visit (HOSPITAL_BASED_OUTPATIENT_CLINIC_OR_DEPARTMENT_OTHER): Payer: Medicare Other | Admitting: Anesthesiology

## 2021-08-05 ENCOUNTER — Ambulatory Visit (HOSPITAL_COMMUNITY): Payer: Medicare Other | Admitting: Anesthesiology

## 2021-08-05 ENCOUNTER — Ambulatory Visit (HOSPITAL_COMMUNITY)
Admission: RE | Admit: 2021-08-05 | Discharge: 2021-08-05 | Disposition: A | Discharge status: Home / Self Care | Payer: Medicare Other | Source: Ambulatory Visit | Attending: Internal Medicine | Admitting: Internal Medicine

## 2021-08-05 ENCOUNTER — Encounter (HOSPITAL_COMMUNITY)
Admission: RE | Disposition: A | Discharge status: Home / Self Care | Payer: Self-pay | Source: Ambulatory Visit | Attending: Internal Medicine

## 2021-08-05 ENCOUNTER — Encounter (HOSPITAL_COMMUNITY): Payer: Self-pay

## 2021-08-05 ENCOUNTER — Other Ambulatory Visit: Payer: Self-pay

## 2021-08-05 DIAGNOSIS — K219 Gastro-esophageal reflux disease without esophagitis: Secondary | ICD-10-CM | POA: Diagnosis not present

## 2021-08-05 DIAGNOSIS — K635 Polyp of colon: Secondary | ICD-10-CM

## 2021-08-05 DIAGNOSIS — J45909 Unspecified asthma, uncomplicated: Secondary | ICD-10-CM | POA: Diagnosis not present

## 2021-08-05 DIAGNOSIS — Z800 Family history of malignant neoplasm of digestive organs: Secondary | ICD-10-CM | POA: Diagnosis not present

## 2021-08-05 DIAGNOSIS — Z1211 Encounter for screening for malignant neoplasm of colon: Secondary | ICD-10-CM | POA: Diagnosis not present

## 2021-08-05 DIAGNOSIS — Z85038 Personal history of other malignant neoplasm of large intestine: Secondary | ICD-10-CM | POA: Diagnosis not present

## 2021-08-05 DIAGNOSIS — D122 Benign neoplasm of ascending colon: Secondary | ICD-10-CM | POA: Insufficient documentation

## 2021-08-05 DIAGNOSIS — I10 Essential (primary) hypertension: Secondary | ICD-10-CM | POA: Diagnosis not present

## 2021-08-05 DIAGNOSIS — F17210 Nicotine dependence, cigarettes, uncomplicated: Secondary | ICD-10-CM | POA: Diagnosis not present

## 2021-08-05 DIAGNOSIS — K648 Other hemorrhoids: Secondary | ICD-10-CM

## 2021-08-05 DIAGNOSIS — Z9049 Acquired absence of other specified parts of digestive tract: Secondary | ICD-10-CM | POA: Insufficient documentation

## 2021-08-05 DIAGNOSIS — Z980 Intestinal bypass and anastomosis status: Secondary | ICD-10-CM | POA: Diagnosis not present

## 2021-08-05 HISTORY — PX: POLYPECTOMY: SHX5525

## 2021-08-05 HISTORY — PX: COLONOSCOPY WITH PROPOFOL: SHX5780

## 2021-08-05 SURGERY — COLONOSCOPY WITH PROPOFOL
Anesthesia: General

## 2021-08-05 MED ORDER — PROPOFOL 10 MG/ML IV BOLUS
INTRAVENOUS | Status: DC | PRN
  Administered 2021-08-05: 100 mg via INTRAVENOUS

## 2021-08-05 MED ORDER — LIDOCAINE HCL (CARDIAC) PF 100 MG/5ML IV SOSY
PREFILLED_SYRINGE | INTRAVENOUS | Status: DC | PRN
  Administered 2021-08-05: 50 mg via INTRAVENOUS

## 2021-08-05 MED ORDER — LACTATED RINGERS IV SOLN: INTRAVENOUS | Status: DC

## 2021-08-05 MED ORDER — PROPOFOL 500 MG/50ML IV EMUL
INTRAVENOUS | Status: DC | PRN
  Administered 2021-08-05: 150 ug/kg/min via INTRAVENOUS

## 2021-08-05 NOTE — Transfer of Care (Signed)
Immediate Anesthesia Transfer of Care Note  Patient: Ashley Patrick  Procedure(s) Performed: COLONOSCOPY WITH PROPOFOL POLYPECTOMY  Patient Location: Short Stay  Anesthesia Type:General  Level of Consciousness: awake  Airway & Oxygen Therapy: Patient Spontanous Breathing  Post-op Assessment: Report given to RN and Post -op Vital signs reviewed and stable  Post vital signs: Reviewed and stable  Last Vitals:  Vitals Value Taken Time  BP    Temp    Pulse    Resp    SpO2      Last Pain:  Vitals:   08/05/21 1005  TempSrc:   PainSc: 0-No pain      Patients Stated Pain Goal: 5 (66/06/00 4599)  Complications: No notable events documented.

## 2021-08-05 NOTE — Op Note (Signed)
[facility] Patient Name: Ashley Patrick Procedure Date: 08/05/2021 9:52 AM MRN: [id number] Date of Birth: 16-Jun-1956 Attending MD: Elon Alas. Abbey Chatters DO CSN: [id number] Age: 66 Admit Type: Inpatient Procedure:                Colonoscopy Indications:              High risk colon cancer surveillance: Personal                            history of colon cancer Providers:                Elon Alas. Abbey Chatters, DO Referring MD:              Medicines:                See the Anesthesia note for documentation of the                            administered medications Complications:            No immediate complications. Estimated Blood Loss:     Estimated blood loss was minimal. Procedure:                Pre-Anesthesia Assessment:                           - The anesthesia plan was to use monitored                            anesthesia care (MAC).                           After obtaining informed consent, the colonoscope                            was passed under direct vision. Throughout the                            procedure, the patient's blood pressure, pulse, and                            oxygen saturations were monitored continuously. The                            [id number]-HQ190L ([id number]) scope was introduced through                            the anus and advanced to the the cecum, identified                            by appendiceal orifice and ileocecal valve. The                            colonoscopy was performed without difficulty. The                            patient tolerated the procedure well. The quality  of the bowel preparation was evaluated using the                            BBPS [facility] Bowel Preparation Scale) with scores                            of: Right Colon = 3, Transverse Colon = 3 and Left                            Colon = 3 (entire mucosa seen well with no residual                            staining, small fragments of stool  or opaque                            liquid). The total BBPS score equals 9. Scope In: 10:09:46 AM Scope Out: 10:24:19 AM Scope Withdrawal Time: 0 hours 12 minutes 29 seconds  Total Procedure Duration: 0 hours 14 minutes 33 seconds  Findings:      The perianal and digital rectal examinations were normal.      Non-bleeding internal hemorrhoids were found during endoscopy.      A 7 mm polyp was found in the ascending colon. The polyp was sessile.       The polyp was removed with a cold snare. Resection and retrieval were       complete.      Two sessile polyps were found in the sigmoid colon. The polyps were 4 to       5 mm in size. These polyps were removed with a cold snare. Resection and       retrieval were complete.      There was evidence of a prior end-to-end colo-colonic anastomosis in the       sigmoid colon. This was patent and was characterized by healthy       appearing mucosa. The anastomosis was traversed.      The exam was otherwise without abnormality. Impression:               - Non-bleeding internal hemorrhoids.                           - One 7 mm polyp in the ascending colon, removed                            with a cold snare. Resected and retrieved.                           - Two 4 to 5 mm polyps in the sigmoid colon,                            removed with a cold snare. Resected and retrieved.                           - Patent end-to-end colo-colonic anastomosis,  characterized by healthy appearing mucosa.                           - The examination was otherwise normal. Moderate Sedation:      Per Anesthesia Care Recommendation:           - Patient has a contact number available for                            emergencies. The signs and symptoms of potential                            delayed complications were discussed with the                            patient. Return to normal activities tomorrow.                             Written discharge instructions were provided to the                            patient.                           - Resume previous diet.                           - Continue present medications.                           - Await pathology results.                           - Repeat colonoscopy in 3 - 5 years for                            surveillance.                           - Return to GI clinic in 4 months. Procedure Code(s):        --- Professional ---                           [phone], Colonoscopy, flexible; with removal of                            tumor(s), polyp(s), or other lesion(s) by snare                            technique Diagnosis Code(s):        --- Professional ---                           K63.5, Polyp of colon                           Z85.038, Personal history of other malignant  neoplasm of large intestine                           Z98.0, Intestinal bypass and anastomosis status                           K64.8, Other hemorrhoids CPT copyright 2019 [employer] Medical [employer]. All rights reserved. The codes documented in this report are preliminary and upon coder review may  be revised to meet current compliance requirements. Elon Alas. Abbey Chatters, DO Wren Abbey Chatters, DO 08/05/2021 10:27:51 AM This report has been signed electronically. Number of Addenda: 0

## 2021-08-05 NOTE — Discharge Instructions (Addendum)
°  Colonoscopy Discharge Instructions  Read the instructions outlined below and refer to this sheet in the next few weeks. These discharge instructions provide you with general information on caring for yourself after you leave the hospital. Your doctor may also give you specific instructions. While your treatment has been planned according to the most current medical practices available, unavoidable complications occasionally occur.   ACTIVITY You may resume your regular activity, but move at a slower pace for the next 24 hours.  Take frequent rest periods for the next 24 hours.  Walking will help get rid of the air and reduce the bloated feeling in your belly (abdomen).  No driving for 24 hours (because of the medicine (anesthesia) used during the test).   Do not sign any important legal documents or operate any machinery for 24 hours (because of the anesthesia used during the test).  NUTRITION Drink plenty of fluids.  You may resume your normal diet as instructed by your doctor.  Begin with a light meal and progress to your normal diet. Heavy or fried foods are harder to digest and may make you feel sick to your stomach (nauseated).  Avoid alcoholic beverages for 24 hours or as instructed.  MEDICATIONS You may resume your normal medications unless your doctor tells you otherwise.  WHAT YOU CAN EXPECT TODAY Some feelings of bloating in the abdomen.  Passage of more gas than usual.  Spotting of blood in your stool or on the toilet paper.  IF YOU HAD POLYPS REMOVED DURING THE COLONOSCOPY: No aspirin products for 7 days or as instructed.  No alcohol for 7 days or as instructed.  Eat a soft diet for the next 24 hours.  FINDING OUT THE RESULTS OF YOUR TEST Not all test results are available during your visit. If your test results are not back during the visit, make an appointment with your caregiver to find out the results. Do not assume everything is normal if you have not heard from your  caregiver or the medical facility. It is important for you to follow up on all of your test results.  SEEK IMMEDIATE MEDICAL ATTENTION IF: You have more than a spotting of blood in your stool.  Your belly is swollen (abdominal distention).  You are nauseated or vomiting.  You have a temperature over 101.  You have abdominal pain or discomfort that is severe or gets worse throughout the day.   Your colonoscopy revealed 3 polyp(s) which I removed successfully. Await pathology results, my office will contact you. I recommend repeating colonoscopy in 5 years for surveillance purposes.   Follow up with GI in 4 months  I hope you have a great rest of your week!  Charles K. Carver, D.O. Gastroenterology and Hepatology [facility]  

## 2021-08-05 NOTE — Anesthesia Postprocedure Evaluation (Signed)
Anesthesia Post Note  Patient: Geneveive Furness Reddix  Procedure(s) Performed: COLONOSCOPY WITH PROPOFOL POLYPECTOMY  Patient location during evaluation: Phase II Anesthesia Type: General Level of consciousness: awake and alert and oriented Pain management: pain level controlled Vital Signs Assessment: post-procedure vital signs reviewed and stable Respiratory status: spontaneous breathing, nonlabored ventilation and respiratory function stable Cardiovascular status: blood pressure returned to baseline and stable Postop Assessment: no apparent nausea or vomiting Anesthetic complications: no   No notable events documented.   Last Vitals:  Vitals:   08/05/21 0935 08/05/21 1030  BP: (!) 141/82 122/85  Pulse: 80 90  Resp: 15 17  Temp: 36.7 C   SpO2: 95% 95%    Last Pain:  Vitals:   08/05/21 1030  TempSrc:   PainSc: 0-No pain                 Traniece Boffa C Iasha Mccalister

## 2021-08-05 NOTE — Interval H&P Note (Signed)
History and Physical Interval Note:  08/05/2021 9:56 AM  Ashley Patrick  has presented today for surgery, with the diagnosis of history of colon cancer, lower abdominal pain, history of colon polyps.  The various methods of treatment have been discussed with the patient and family. After consideration of risks, benefits and other options for treatment, the patient has consented to  Procedure(s) with comments: COLONOSCOPY WITH PROPOFOL (N/A) - 10:00am as a surgical intervention.  The patient's history has been reviewed, patient examined, no change in status, stable for surgery.  I have reviewed the patient's chart and labs.  Questions were answered to the patient's satisfaction.     Eloise Harman

## 2021-08-05 NOTE — Anesthesia Preprocedure Evaluation (Signed)
Anesthesia Evaluation  Patient identified by MRN, date of birth, ID band Patient awake    Reviewed: Allergy & Precautions, NPO status , Patient's Chart, lab work & pertinent test results  Airway Mallampati: II  TM Distance: >3 FB Neck ROM: Full    Dental  (+) Dental Advisory Given, Caps, Missing   Pulmonary shortness of breath and with exertion, asthma , Current Smoker and Patient abstained from smoking.,    Pulmonary exam normal breath sounds clear to auscultation       Cardiovascular hypertension, Pt. on medications Normal cardiovascular exam Rhythm:Regular Rate:Normal  01-Oct-2020 13:21:46 [facility] ROUTINE RECORD 01-Oct-1955 (69 yr) Female Black Vent. rate 75 BPM PR interval 140 ms QRS duration 92 ms QT/QTcB 430/480 ms P-R-T axes 56 -19 11 Normal sinus rhythm Left ventricular hypertrophy ( R in aVL , Sokolow-Lyon , Cornell product ) Left atrial enlargement Nonspecific ST abnormality Abnormal ECG When compared with ECG of 19-Jun-2018 No significant change was found Confirmed by End, Harrell Gave [phone]) on 10/02/2020 12:28:45 PM   Neuro/Psych  Headaches, PSYCHIATRIC DISORDERS Anxiety Depression Bipolar Disorder Schizophrenia    GI/Hepatic Neg liver ROS, hiatal hernia, GERD  Medicated and Controlled,  Endo/Other  negative endocrine ROS  Renal/GU negative Renal ROS  negative genitourinary   Musculoskeletal  (+) Arthritis , Osteoarthritis,    Abdominal   Peds negative pediatric ROS (+)  Hematology negative hematology ROS (+)   Anesthesia Other Findings   Reproductive/Obstetrics negative OB ROS                            Anesthesia Physical Anesthesia Plan  ASA: 3  Anesthesia Plan: General   Post-op Pain Management: Minimal or no pain anticipated   Induction: Intravenous  PONV Risk Score and Plan: TIVA  Airway Management Planned: Nasal Cannula and  Natural Airway  Additional Equipment:   Intra-op Plan:   Post-operative Plan:   Informed Consent: I have reviewed the patients History and Physical, chart, labs and discussed the procedure including the risks, benefits and alternatives for the proposed anesthesia with the patient or authorized representative who has indicated his/her understanding and acceptance.     Dental advisory given  Plan Discussed with: CRNA and Surgeon  Anesthesia Plan Comments:        Anesthesia Quick Evaluation

## 2021-08-08 LAB — SURGICAL PATHOLOGY

## 2021-08-09 ENCOUNTER — Encounter (HOSPITAL_COMMUNITY): Payer: Self-pay | Admitting: Internal Medicine
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
## 2021-08-12 ENCOUNTER — Other Ambulatory Visit: Payer: Self-pay | Admitting: Psychiatry

## 2021-08-15 ENCOUNTER — Other Ambulatory Visit: Payer: Self-pay

## 2021-08-15 ENCOUNTER — Encounter: Payer: Self-pay | Admitting: Psychiatry

## 2021-08-15 ENCOUNTER — Ambulatory Visit (INDEPENDENT_AMBULATORY_CARE_PROVIDER_SITE_OTHER): Payer: Medicare Other | Admitting: Psychiatry

## 2021-08-15 VITALS — BP 181/107 | HR 85 | Temp 97.9°F | Wt 217.0 lb

## 2021-08-15 DIAGNOSIS — G4700 Insomnia, unspecified: Secondary | ICD-10-CM | POA: Diagnosis not present

## 2021-08-15 DIAGNOSIS — F332 Major depressive disorder, recurrent severe without psychotic features: Secondary | ICD-10-CM | POA: Diagnosis not present

## 2021-08-15 DIAGNOSIS — G2401 Drug induced subacute dyskinesia: Secondary | ICD-10-CM | POA: Diagnosis not present

## 2021-08-15 MED ORDER — ESZOPICLONE 1 MG PO TABS: 1.0000 mg | ORAL_TABLET | Freq: Every evening | ORAL | 0 refills | Status: DC | PRN

## 2021-08-15 NOTE — Patient Instructions (Signed)
Continue fluoxetine 60 mg daily Continue Latuda 20 mg daily  Continue tetrabenazine 12.5 mg twice a day Start Lunesta 1 mg at night  Discontinue Trazodone Next appointment- 3/28 at 2 PM, video

## 2021-08-16 ENCOUNTER — Telehealth (HOSPITAL_COMMUNITY): Payer: Self-pay | Admitting: Psychiatry

## 2021-08-16 NOTE — Telephone Encounter (Signed)
Called to schedule NP appt with [facility] per Dr. Sharrell Ku request left detailed vm to return call and schedule

## 2021-08-22 ENCOUNTER — Telehealth: Payer: Self-pay

## 2021-08-22 NOTE — Telephone Encounter (Signed)
pt called states that the sleeping medication is not working she needs something else.  ?

## 2021-08-22 NOTE — Telephone Encounter (Signed)
Please advise her to take Lunesta 2 mg (2 tabs of 1 mg) to see if it helps unless she has any side effect.

## 2021-08-24 NOTE — Telephone Encounter (Signed)
Called yesterday and left a message and I called again today and left a message.  Going to close note .  ?

## 2021-08-25 ENCOUNTER — Other Ambulatory Visit: Payer: Self-pay | Admitting: Psychiatry

## 2021-08-25 MED ORDER — ZALEPLON 5 MG PO CAPS: 5.0000 mg | ORAL_CAPSULE | Freq: Every evening | ORAL | 0 refills | Status: DC | PRN

## 2021-08-25 NOTE — Telephone Encounter (Signed)
Discussed with the patient. Will try Sonata 5 mg at night as needed for sleep. She agreed to discontinue Lunesta.

## 2021-08-25 NOTE — Telephone Encounter (Signed)
pt states she tried to do the two pills of lunesta and it is still not working is there anything else she can take.  ?

## 2021-08-25 NOTE — Telephone Encounter (Signed)
lu

## 2021-09-08 NOTE — Progress Notes (Signed)
Virtual Visit via Video Note ? ?I connected with Ashley Patrick on 09/13/21 at  2:00 PM EDT by a video enabled telemedicine application and verified that I am speaking with the correct person using two identifiers. ? ?Location: ?Patient: home ?Provider: office ?Persons participated in the visit- patient, provider  ?  ?I discussed the limitations of evaluation and management by telemedicine and the availability of in person appointments. The patient expressed understanding and agreed to proceed. ?  ?I discussed the assessment and treatment plan with the patient. The patient was provided an opportunity to ask questions and all were answered. The patient agreed with the plan and demonstrated an understanding of the instructions. ?  ?The patient was advised to call back or seek an in-person evaluation if the symptoms worsen or if the condition fails to improve as anticipated. ? ?I provided 15 minutes of non-face-to-face time during this encounter. ? ? ?Ashley Clay, MD ? ? ? ?BH MD/PA/NP OP Progress Note ? ?09/13/2021 2:43 PM ?Ashley Patrick  ?MRN:  [id number] ? ?Chief Complaint:  ?Chief Complaint  ?Patient presents with  ? Follow-up  ? Depression  ? ?HPI:  ?- Since the last visit, lunesta was switched to [place].  ?This is a follow-up appointment for depression, primary dyskinesia and insomnia.  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  She still goes to the [facility], and enjoys going to senior citizen.  She tends to think about her son all the time.  Although she was told by her children not to cry over about him, she cannot help doing it.  Validated her grief.  She has insomnia.  She feels depressed and anxious at times.  She denies change in appetite.  She denies SI.  She denies hallucinations.  She has been taking 3 tabs of Sonata (15 mg total). She also has been taking 3 of tetrabenazine, which she has been working well for lipsmacking.  Provided psychoeducation about importance of medication  adherence.  She agrees with the following plans.  ? ?Daily routine: stays in the house, watch TV, sees great granddaughter a few times per week ?Household: lives by herself ?Marital status: separated for over 20's ?Number of children: 3, 7 grandchildren ? ?Visit Diagnosis:  ?  ICD-10-CM   ?1. MDD (major depressive disorder), recurrent episode, moderate (HCC)  F33.1   ?  ?2. Tardive dyskinesia  G24.01   ?  ?3. Insomnia, unspecified type  G47.00   ?  ? ? ?Past Psychiatric History: Please see initial evaluation for full details. I have reviewed the history. No updates at this time.  ?  ? ?Past Medical History:  ?Past Medical History:  ?Diagnosis Date  ? Anxiety   ? Aortic atherosclerosis ([place])   ? Arthritis   ? Asthma   ? Back pain   ? Bipolar disorder ([place])   ? Cancer [facility]) 2014  ? Colon Cancer  ? Constipation   ? Depression   ? Dyspnea   ? Hypercholesteremia   ? Hypertension   ? Insomnia   ? Lateral epicondylitis  of elbow   ? right  ? Migraine headache   ? Nicotine addiction   ? Obesity   ? History of  ? OD (overdose of drug)   ? hospitalized for 11 days   ? Panic attacks   ? Psychosis ([place])   ? Schizophrenia ([place])   ?  ?Past Surgical History:  ?Procedure Laterality Date  ? ABDOMINAL HYSTERECTOMY    ?  BIOPSY N/A 08/13/2014  ? Procedure: BIOPSY;  Surgeon: Daneil Dolin, MD;  Location: [facility];  Service: Endoscopy;  Laterality: N/A;  ? BREAST BIOPSY  10/18/2011  ? Procedure: BREAST BIOPSY;  Surgeon: Jamesetta So, MD;  Location: [facility];  Service: General;  Laterality: Left;  ? COLON RESECTION N/A 06/23/2013  ? Procedure: HAND ASSISTED LAPAROSCOPIC PARTIAL COLECTOMY;  Surgeon: Jamesetta So, MD;  Location: [facility];  Service: General;  Laterality: N/A;  ? COLONOSCOPY    ? COLONOSCOPY N/A 05/29/2013  ? JIR:CVELFYB mass most likely representing colorectal cancer S/ P biospy.Multiple colonic and rectal polyps removed/treated as described above. Colonic diverticulosis  ? COLONOSCOPY WITH PROPOFOL N/A 08/13/2014  ? RMR:  Status post sigmoid colectomy. Multiple colonic polyps removed as described above. Redundant colon. Pan colonic diverticuloisi  ? COLONOSCOPY WITH PROPOFOL N/A 05/22/2016  ? Surgeon: Daneil Dolin, MD; 2 polyps removed, but did not survive pathology processing.  Surgical anastomosis in the sigmoid colon noted, appeared patent, question somewhat narrowed just proximal anastomosis.  Repeat in 3 years.  ? COLONOSCOPY WITH PROPOFOL N/A 08/05/2021  ? Procedure: COLONOSCOPY WITH PROPOFOL;  Surgeon: Eloise Harman, DO;  [facility];  Service: Endoscopy;  Laterality: N/A;  10:00am  ? ESOPHAGEAL DILATION N/A 08/13/2014  ? Procedure: [place];  Surgeon: Daneil Dolin, MD;  Location: [facility];  Service: Endoscopy;  Laterality: N/A;  ? ESOPHAGOGASTRODUODENOSCOPY N/A 12/26/2012  ? OFB:PZWCHEN reflux esophagitis. Gastric and duodenal bulbar erosions-status post gastric biopsynegative H.pylori  ? ESOPHAGOGASTRODUODENOSCOPY (EGD) WITH PROPOFOL N/A 08/13/2014  ? RMR: Small benign cystic-appearing lesion in distal esophagus of doubtful clinical significance, otherwise normal esophagus, status post Maloney dilation. Small hiatal hernia, some retained gastric contents (query delayed gastric emptying.)  ? partial hysterectomy    ? POLYPECTOMY N/A 08/13/2014  ? Procedure: POLYPECTOMY;  Surgeon: Daneil Dolin, MD;  Location: [facility];  Service: Endoscopy;  Laterality: N/A;  ? POLYPECTOMY  08/05/2021  ? Procedure: POLYPECTOMY;  Surgeon: Eloise Harman, DO;  [facility];  Service: Endoscopy;;  ? ? ?Family Psychiatric History: Please see initial evaluation for full details. I have reviewed the history. No updates at this time.  ?  ? ?Family History:  ?Family History  ?Problem Relation Age of Onset  ? Stroke Mother   ? Drug abuse Mother   ? Arthritis Mother   ? Arthritis Father   ? Colon cancer Father   ?     diagnosed in his 58s  ? Drug abuse Father   ? Hypertension Sister   ? Asthma  Sister   ? HIV/AIDS Brother   ? Colon cancer Paternal Uncle   ?     23s  ? Anesthesia problems Neg Hx   ? Hypotension Neg Hx   ? Malignant hyperthermia Neg Hx   ? Pseudochol deficiency Neg Hx   ? ? ?Social History:  ?Social History  ? ?Socioeconomic History  ? Marital status: Legally Separated  ?  Spouse name: Not on file  ? Number of children: 3  ? Years of education: Not on file  ? Highest education level: Not on file  ?Occupational History  ? Not on file  ?Tobacco Use  ? Smoking status: Every Day  ?  Packs/day: 0.50  ?  Years: 38.00  ?  Pack years: 19.00  ?  Types: Cigarettes  ?  Last attempt to quit: 06/19/2014  ?  Years since quitting: 7.2  ? Smokeless  tobacco: Never  ? Tobacco comments:  ?  vapor only  ?Vaping Use  ? Vaping Use: Never used  ?Substance and Sexual Activity  ? Alcohol use: No  ?  Alcohol/week: 0.0 standard drinks  ? Drug use: No  ? Sexual activity: Not Currently  ?  Birth control/protection: Surgical  ?  Comment: hyst  ?Other Topics Concern  ? Not on file  ?Social History Narrative  ? Not on file  ? ?Social Determinants of Health  ? ?Financial Resource Strain: Low Risk   ? Difficulty of Paying Living Expenses: Not very hard  ?Food Insecurity: No Food Insecurity  ? Worried About Charity fundraiser in the Last Year: Never true  ? Ran Out of Food in the Last Year: Never true  ?Transportation Needs: No Transportation Needs  ? Lack of Transportation (Medical): No  ? Lack of Transportation (Non-Medical): No  ?Physical Activity: Insufficiently Active  ? Days of Exercise per Week: 1 day  ? Minutes of Exercise per Session: 10 min  ?Stress: No Stress Concern Present  ? Feeling of Stress : Only a little  ?Social Connections: Moderately Isolated  ? Frequency of Communication with Friends and Family: Twice a week  ? Frequency of Social Gatherings with Friends and Family: Once a week  ? Attends Religious Services: 1 to 4 times per year  ? Active Member of Clubs or Organizations: No  ? Attends Theatre manager Meetings: Never  ? Marital Status: Separated  ? ? ?Allergies: No Known Allergies ? ?Metabolic Disorder Labs: ?Lab Results  ?Component Value Date  ? HGBA1C 5.5 01/01/2017  ? MPG 111 01/01/2017  ? MPG 114 09/12/

## 2021-09-13 ENCOUNTER — Telehealth (INDEPENDENT_AMBULATORY_CARE_PROVIDER_SITE_OTHER): Payer: Medicare Other | Admitting: Psychiatry

## 2021-09-13 ENCOUNTER — Other Ambulatory Visit: Payer: Self-pay

## 2021-09-13 ENCOUNTER — Telehealth: Payer: Self-pay

## 2021-09-13 ENCOUNTER — Encounter: Payer: Self-pay | Admitting: Psychiatry

## 2021-09-13 DIAGNOSIS — G4700 Insomnia, unspecified: Secondary | ICD-10-CM

## 2021-09-13 DIAGNOSIS — F331 Major depressive disorder, recurrent, moderate: Secondary | ICD-10-CM | POA: Diagnosis not present

## 2021-09-13 DIAGNOSIS — G2401 Drug induced subacute dyskinesia: Secondary | ICD-10-CM

## 2021-09-13 MED ORDER — HYDROXYZINE HCL 25 MG PO TABS: 25.0000 mg | ORAL_TABLET | Freq: Every day | ORAL | 2 refills | Status: DC | PRN

## 2021-09-13 MED ORDER — TETRABENAZINE 12.5 MG PO TABS: ORAL_TABLET | ORAL | 1 refills | Status: DC

## 2021-09-13 MED ORDER — LURASIDONE HCL 20 MG PO TABS: 20.0000 mg | ORAL_TABLET | Freq: Every day | ORAL | 1 refills | Status: DC

## 2021-09-13 MED ORDER — ZALEPLON 10 MG PO CAPS: 10.0000 mg | ORAL_CAPSULE | Freq: Every evening | ORAL | 1 refills | Status: DC | PRN

## 2021-09-13 NOTE — Addendum Note (Signed)
Addended by: [name] on: 09/13/2021 05:46 PM ? ? Modules accepted: Orders ? ?

## 2021-09-13 NOTE — Patient Instructions (Signed)
Continue fluoxetine 60 mg daily  ?Continue Latuda 20 mg daily  ?Continue tetrabenazine 25 mg daily, 12.5 mg in PM ?Increase Sonata 10 mg at night  ?Next appointment- 5/17 at 10:30 ?

## 2021-09-13 NOTE — Telephone Encounter (Signed)
Ordered hydroxyzine. Latuda was sent in earlier.

## 2021-09-13 NOTE — Telephone Encounter (Signed)
hydroxyzine and latuda was not sent to pharmacy.  ?

## 2021-10-24 ENCOUNTER — Other Ambulatory Visit: Payer: Self-pay | Admitting: Psychiatry

## 2021-10-31 NOTE — Progress Notes (Signed)
Virtual Visit via Telephone Note ? ?I connected with Ashley Patrick on 11/02/21 at 10:30 AM EDT by telephone and verified that I am speaking with the correct person using two identifiers. ? ?Location: ?Patient: home ?Provider: office ?Persons participated in the visit- patient, provider  ?  ?I discussed the limitations, risks, security and privacy concerns of performing an evaluation and management service by telephone and the availability of in person appointments. I also discussed with the patient that there may be a patient responsible charge related to this service. The patient expressed understanding and agreed to proceed. ?  ?I discussed the assessment and treatment plan with the patient. The patient was provided an opportunity to ask questions and all were answered. The patient agreed with the plan and demonstrated an understanding of the instructions. ?  ?The patient was advised to call back or seek an in-person evaluation if the symptoms worsen or if the condition fails to improve as anticipated. ? ?I provided 15 minutes of non-face-to-face time during this encounter. ? ? ?[name], MD ? ? ? ?BH MD/PA/NP OP Progress Note ? ?11/02/2021 11:12 AM ?Ashley Patrick  ?MRN:  [id number] ? ?Chief Complaint:  ?Chief Complaint  ?Patient presents with  ? Follow-up  ? Depression  ? ?HPI:  ?This is a follow-up appointment for depression anterolaterally dyskinesia.  ?She states that she has been doing better.  Although she continues to struggle with her son, she thinks it would be going to be all right.  She continues to go to senior center.  She enjoys bingo and an exercise.  She talks with her other son and daughter frequently, and reports fair relationship with them.  She sleeps well with higher dose of Sonata.  She does not feel drowsy during the day.  Although she is concerned about weight gain, she was prescribed of torsemide due to her leg edema/shortness of breath by her PCP.  Although she feels down at times, it  has been manageable.  She has occasional anxiety.  She has not had any panic attack this month.  She denies SI.  She thinks her mouth movement has improved.  She feels comfortable to stay on the current medication regimen.  Of note, she is not aware of taking any fluoxetine 20 mg tablet; she was advised to bring her medication for medication review at the next visit.  ? ? ?226 lbs ?Wt Readings from Last 3 Encounters:  ?08/15/21 217 lb (98.4 kg)  ?08/02/21 212 lb (96.2 kg)  ?07/11/21 212 lb 6.4 oz (96.3 kg)  ?  ? ? ?Daily routine: stays in the house, watch TV, sees great granddaughter a few times per week ?Household: lives by herself ?Marital status: separated for over 20's ?Number of children: 3, 7 grandchildren ? ? ?Visit Diagnosis:  ?  ICD-10-CM   ?1. MDD (major depressive disorder), recurrent episode, moderate (HCC)  F33.1   ?  ?2. Tardive dyskinesia  G24.01   ?  ?3. Insomnia, unspecified type  G47.00   ?  ? ? ?Past Psychiatric History: Please see initial evaluation for full details. I have reviewed the history. No updates at this time.  ?  ? ?Past Medical History:  ?Past Medical History:  ?Diagnosis Date  ? Anxiety   ? Aortic atherosclerosis ([place])   ? Arthritis   ? Asthma   ? Back pain   ? Bipolar disorder ([place])   ? Cancer [facility]) 2014  ? Colon Cancer  ? Constipation   ? Depression   ?  Dyspnea   ? Hypercholesteremia   ? Hypertension   ? Insomnia   ? Lateral epicondylitis  of elbow   ? right  ? Migraine headache   ? Nicotine addiction   ? Obesity   ? History of  ? OD (overdose of drug)   ? hospitalized for 11 days   ? Panic attacks   ? Psychosis ([place])   ? Schizophrenia ([place])   ?  ?Past Surgical History:  ?Procedure Laterality Date  ? ABDOMINAL HYSTERECTOMY    ? BIOPSY N/A 08/13/2014  ? Procedure: BIOPSY;  Surgeon: Daneil Dolin, MD;  Location: [facility];  Service: Endoscopy;  Laterality: N/A;  ? BREAST BIOPSY  10/18/2011  ? Procedure: BREAST BIOPSY;  Surgeon: Jamesetta So, MD;  Location: [facility];  Service: General;   Laterality: Left;  ? COLON RESECTION N/A 06/23/2013  ? Procedure: HAND ASSISTED LAPAROSCOPIC PARTIAL COLECTOMY;  Surgeon: Jamesetta So, MD;  Location: [facility];  Service: General;  Laterality: N/A;  ? COLONOSCOPY    ? COLONOSCOPY N/A 05/29/2013  ? [id number] mass most likely representing colorectal cancer S/ P biospy.Multiple colonic and rectal polyps removed/treated as described above. Colonic diverticulosis  ? COLONOSCOPY WITH PROPOFOL N/A 08/13/2014  ? RMR: Status post sigmoid colectomy. Multiple colonic polyps removed as described above. Redundant colon. Pan colonic diverticuloisi  ? COLONOSCOPY WITH PROPOFOL N/A 05/22/2016  ? Surgeon: Daneil Dolin, MD; 2 polyps removed, but did not survive pathology processing.  Surgical anastomosis in the sigmoid colon noted, appeared patent, question somewhat narrowed just proximal anastomosis.  Repeat in 3 years.  ? COLONOSCOPY WITH PROPOFOL N/A 08/05/2021  ? Procedure: COLONOSCOPY WITH PROPOFOL;  Surgeon: Eloise Harman, DO;  [facility];  Service: Endoscopy;  Laterality: N/A;  10:00am  ? ESOPHAGEAL DILATION N/A 08/13/2014  ? Procedure: [place];  Surgeon: Daneil Dolin, MD;  Location: [facility];  Service: Endoscopy;  Laterality: N/A;  ? ESOPHAGOGASTRODUODENOSCOPY N/A 12/26/2012  ? [id number] reflux esophagitis. Gastric and duodenal bulbar erosions-status post gastric biopsynegative H.pylori  ? ESOPHAGOGASTRODUODENOSCOPY (EGD) WITH PROPOFOL N/A 08/13/2014  ? RMR: Small benign cystic-appearing lesion in distal esophagus of doubtful clinical significance, otherwise normal esophagus, status post Maloney dilation. Small hiatal hernia, some retained gastric contents (query delayed gastric emptying.)  ? partial hysterectomy    ? POLYPECTOMY N/A 08/13/2014  ? Procedure: POLYPECTOMY;  Surgeon: Daneil Dolin, MD;  Location: [facility];  Service: Endoscopy;  Laterality: N/A;  ? POLYPECTOMY  08/05/2021  ? Procedure: POLYPECTOMY;  Surgeon:  Eloise Harman, DO;  Location[facility];  Service: Endoscopy;;  ? ? ?Family Psychiatric History: Please see initial evaluation for full details. I have reviewed the history. No updates at this time.  ?  ? ?Family History:  ?Family History  ?Problem Relation Age of Onset  ? Stroke Mother   ? Drug abuse Mother   ? Arthritis Mother   ? Arthritis Father   ? Colon cancer Father   ?     diagnosed in his 3s  ? Drug abuse Father   ? Hypertension Sister   ? Asthma Sister   ? HIV/AIDS Brother   ? Colon cancer Paternal Uncle   ?     93s  ? Anesthesia problems Neg Hx   ? Hypotension Neg Hx   ? Malignant hyperthermia Neg Hx   ? Pseudochol deficiency Neg Hx   ? ? ?Social History:  ?Social History  ? ?Socioeconomic History  ?  Marital status: Legally Separated  ?  Spouse name: Not on file  ? Number of children: 3  ? Years of education: Not on file  ? Highest education level: Not on file  ?Occupational History  ? Not on file  ?Tobacco Use  ? Smoking status: Every Day  ?  Packs/day: 0.50  ?  Years: 38.00  ?  Pack years: 19.00  ?  Types: Cigarettes  ?  Last attempt to quit: 06/19/2014  ?  Years since quitting: 7.3  ? Smokeless tobacco: Never  ? Tobacco comments:  ?  vapor only  ?Vaping Use  ? Vaping Use: Never used  ?Substance and Sexual Activity  ? Alcohol use: No  ?  Alcohol/week: 0.0 standard drinks  ? Drug use: No  ? Sexual activity: Not Currently  ?  Birth control/protection: Surgical  ?  Comment: hyst  ?Other Topics Concern  ? Not on file  ?Social History Narrative  ? Not on file  ? ?Social Determinants of Health  ? ?Financial Resource Strain: Low Risk   ? Difficulty of Paying Living Expenses: Not very hard  ?Food Insecurity: No Food Insecurity  ? Worried About Charity fundraiser in the Last Year: Never true  ? Ran Out of Food in the Last Year: Never true  ?Transportation Needs: No Transportation Needs  ? Lack of Transportation (Medical): No  ? Lack of Transportation (Non-Medical): No  ?Physical Activity: Insufficiently  Active  ? Days of Exercise per Week: 1 day  ? Minutes of Exercise per Session: 10 min  ?Stress: No Stress Concern Present  ? Feeling of Stress : Only a little  ?Social Connections: Moderately Isolated  ?

## 2021-11-02 ENCOUNTER — Encounter: Payer: Self-pay | Admitting: Psychiatry

## 2021-11-02 ENCOUNTER — Telehealth (INDEPENDENT_AMBULATORY_CARE_PROVIDER_SITE_OTHER): Payer: Medicare Other | Admitting: Psychiatry

## 2021-11-02 DIAGNOSIS — F331 Major depressive disorder, recurrent, moderate: Secondary | ICD-10-CM

## 2021-11-02 DIAGNOSIS — G2401 Drug induced subacute dyskinesia: Secondary | ICD-10-CM

## 2021-11-02 DIAGNOSIS — G4700 Insomnia, unspecified: Secondary | ICD-10-CM | POA: Diagnosis not present

## 2021-11-02 MED ORDER — FLUOXETINE HCL 40 MG PO CAPS: 40.0000 mg | ORAL_CAPSULE | Freq: Every day | ORAL | 0 refills | Status: DC

## 2021-11-02 MED ORDER — TETRABENAZINE 12.5 MG PO TABS: ORAL_TABLET | ORAL | 0 refills | Status: DC

## 2021-11-02 NOTE — Patient Instructions (Signed)
Continue fluoxetine 60 mg daily  ?Continue Latuda 20 mg daily  ?Continue tetrabenazine 25 mg daily, 12.5 mg in PM ?Increase Sonata 10 mg at night  ?Next appointment- 6/19 at 4 PM, video ?

## 2021-11-03 ENCOUNTER — Other Ambulatory Visit (HOSPITAL_COMMUNITY): Payer: Self-pay | Admitting: Family Medicine

## 2021-11-03 DIAGNOSIS — R0602 Shortness of breath: Secondary | ICD-10-CM

## 2021-11-11 ENCOUNTER — Ambulatory Visit (HOSPITAL_COMMUNITY)
Admission: RE | Admit: 2021-11-11 | Discharge: 2021-11-11 | Disposition: A | Discharge status: Home / Self Care | Payer: Medicare Other | Source: Ambulatory Visit | Attending: Family Medicine | Admitting: Family Medicine

## 2021-11-11 DIAGNOSIS — R0602 Shortness of breath: Secondary | ICD-10-CM | POA: Diagnosis not present

## 2021-11-11 LAB — ECHOCARDIOGRAM COMPLETE
Area-P 1/2: 3.91 cm2
S' Lateral: 2.8 cm

## 2021-11-11 NOTE — Progress Notes (Signed)
*  PRELIMINARY RESULTS* Echocardiogram 2D Echocardiogram has been performed.  Ashley Patrick 11/11/2021, 2:33 PM

## 2021-11-14 ENCOUNTER — Other Ambulatory Visit: Payer: Self-pay | Admitting: Psychiatry
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
## 2021-12-05 ENCOUNTER — Encounter: Payer: Self-pay | Admitting: Psychiatry

## 2021-12-05 ENCOUNTER — Ambulatory Visit (INDEPENDENT_AMBULATORY_CARE_PROVIDER_SITE_OTHER): Payer: Medicare Other | Admitting: Psychiatry

## 2021-12-05 VITALS — BP 159/83 | HR 97 | Temp 98.7°F | Wt 228.0 lb

## 2021-12-05 DIAGNOSIS — G2401 Drug induced subacute dyskinesia: Secondary | ICD-10-CM

## 2021-12-05 DIAGNOSIS — G4700 Insomnia, unspecified: Secondary | ICD-10-CM | POA: Diagnosis not present

## 2021-12-05 DIAGNOSIS — F331 Major depressive disorder, recurrent, moderate: Secondary | ICD-10-CM

## 2021-12-05 DIAGNOSIS — R635 Abnormal weight gain: Secondary | ICD-10-CM

## 2021-12-05 DIAGNOSIS — T50905A Adverse effect of unspecified drugs, medicaments and biological substances, initial encounter: Secondary | ICD-10-CM

## 2021-12-05 MED ORDER — METFORMIN HCL 500 MG PO TABS: 500.0000 mg | ORAL_TABLET | Freq: Every evening | ORAL | 1 refills | Status: DC

## 2021-12-05 MED ORDER — ZALEPLON 10 MG PO CAPS: 10.0000 mg | ORAL_CAPSULE | Freq: Every evening | ORAL | 0 refills | Status: DC | PRN

## 2021-12-05 MED ORDER — TETRABENAZINE 12.5 MG PO TABS: ORAL_TABLET | ORAL | 0 refills | Status: DC

## 2021-12-05 NOTE — Patient Instructions (Signed)
Continue fluoxetine 40 mg daily  Continue Latuda 20 mg daily  Start metformin 500 mg in PM Continue tetrabenazine 25 mg daily, 12.5 mg in PM Continue Sonata 10 mg at night  Next appointment- 7/27 at 10 AM

## 2021-12-14 ENCOUNTER — Emergency Department (HOSPITAL_COMMUNITY): Payer: Medicare Other

## 2021-12-14 ENCOUNTER — Emergency Department (HOSPITAL_COMMUNITY)
Admission: EM | Admit: 2021-12-14 | Discharge: 2021-12-14 | Disposition: A | Discharge status: Home / Self Care | Payer: Medicare Other | Attending: Emergency Medicine | Admitting: Emergency Medicine

## 2021-12-14 ENCOUNTER — Encounter (HOSPITAL_COMMUNITY): Payer: Self-pay | Admitting: Emergency Medicine

## 2021-12-14 ENCOUNTER — Other Ambulatory Visit: Payer: Self-pay

## 2021-12-14 ENCOUNTER — Other Ambulatory Visit: Payer: Self-pay | Admitting: Psychiatry

## 2021-12-14 DIAGNOSIS — Z79899 Other long term (current) drug therapy: Secondary | ICD-10-CM | POA: Insufficient documentation

## 2021-12-14 DIAGNOSIS — R519 Headache, unspecified: Secondary | ICD-10-CM

## 2021-12-14 DIAGNOSIS — I10 Essential (primary) hypertension: Secondary | ICD-10-CM

## 2021-12-14 DIAGNOSIS — F17200 Nicotine dependence, unspecified, uncomplicated: Secondary | ICD-10-CM | POA: Insufficient documentation

## 2021-12-14 DIAGNOSIS — Z7982 Long term (current) use of aspirin: Secondary | ICD-10-CM | POA: Diagnosis not present

## 2021-12-14 LAB — CBC WITH DIFFERENTIAL/PLATELET
Abs Immature Granulocytes: 0.02 10*3/uL (ref 0.00–0.07)
Basophils Absolute: 0 10*3/uL (ref 0.0–0.1)
Basophils Relative: 0 %
Eosinophils Absolute: 0.1 10*3/uL (ref 0.0–0.5)
Eosinophils Relative: 3 %
HCT: 38.1 % (ref 36.0–46.0)
Hemoglobin: 12.9 g/dL (ref 12.0–15.0)
Immature Granulocytes: 0 %
Lymphocytes Relative: 35 %
Lymphs Abs: 1.9 10*3/uL (ref 0.7–4.0)
MCH: 30.4 pg (ref 26.0–34.0)
MCHC: 33.9 g/dL (ref 30.0–36.0)
MCV: 89.9 fL (ref 80.0–100.0)
Monocytes Absolute: 0.5 10*3/uL (ref 0.1–1.0)
Monocytes Relative: 8 %
Neutro Abs: 3 10*3/uL (ref 1.7–7.7)
Neutrophils Relative %: 54 %
Platelets: 183 10*3/uL (ref 150–400)
RBC: 4.24 MIL/uL (ref 3.87–5.11)
RDW: 13.9 % (ref 11.5–15.5)
WBC: 5.5 10*3/uL (ref 4.0–10.5)
nRBC: 0 % (ref 0.0–0.2)

## 2021-12-14 LAB — COMPREHENSIVE METABOLIC PANEL
ALT: 18 U/L (ref 0–44)
AST: 15 U/L (ref 15–41)
Albumin: 4 g/dL (ref 3.5–5.0)
Alkaline Phosphatase: 70 U/L (ref 38–126)
Anion gap: 8 (ref 5–15)
BUN: 16 mg/dL (ref 8–23)
CO2: 31 mmol/L (ref 22–32)
Calcium: 9.3 mg/dL (ref 8.9–10.3)
Chloride: 102 mmol/L (ref 98–111)
Creatinine, Ser: 0.89 mg/dL (ref 0.44–1.00)
GFR, Estimated: >60 mL/min (ref >60)
Glucose, Bld: 90 mg/dL (ref 70–99)
Potassium: 3.3 mmol/L — ABNORMAL LOW (ref 3.5–5.1)
Sodium: 141 mmol/L (ref 135–145)
Total Bilirubin: 0.6 mg/dL (ref 0.3–1.2)
Total Protein: 7.2 g/dL (ref 6.5–8.1)

## 2021-12-14 MED ORDER — AMLODIPINE BESYLATE 5 MG PO TABS: 5.0000 mg | ORAL_TABLET | Freq: Every day | ORAL | 0 refills | Status: DC

## 2021-12-14 MED ORDER — OXYCODONE HCL 5 MG PO TABS
5.0000 mg | ORAL_TABLET | ORAL | Status: AC
  Administered 2021-12-14: 5 mg via ORAL
  Filled 2021-12-14: qty 1

## 2021-12-14 MED ORDER — ACETAMINOPHEN 500 MG PO TABS
1000.0000 mg | ORAL_TABLET | Freq: Once | ORAL | Status: AC
  Administered 2021-12-14: 1000 mg via ORAL
  Filled 2021-12-14: qty 2

## 2021-12-14 MED ORDER — OLMESARTAN MEDOXOMIL 5 MG PO TABS: 10.0000 mg | ORAL_TABLET | Freq: Every day | ORAL | 0 refills | Status: DC

## 2021-12-14 NOTE — ED Provider Notes (Signed)
[facility] Provider Note   CSN: [id number] Arrival date & time: 12/14/21  1641     History  Chief Complaint  Patient presents with   Hypertension    Ashley Patrick is a 66 y.o. female.   Hypertension  Patient is a 66 year old female who has a history of hypertension, acid reflux as well as a history of chronic tobacco use.  She is on torsemide every day.  She presents from her doctor's office after stating that she has been seeing spots intermittently over the month, this usually occurs when she stands up.  It is associated with a headache and watching her blood pressure consistently stay above 601 systolic.  When she went to the office today she had what appeared to be a near syncopal episode with some possible bradycardia and was sent to the emergency department for further evaluation.  She denies chest pain or shortness of breath but complains of some chronic edema of her legs for which she takes torsemide, intermittent black spots that she is seeing in her vision which she is not seeing at this time, no chest pain or shortness of breath.  She currently takes only olmesartan for her blood pressure     Home Medications Prior to Admission medications   Medication Sig Start Date End Date Taking? Authorizing Provider  amLODipine (NORVASC) 5 MG tablet Take 1 tablet (5 mg total) by mouth daily. 12/14/21 01/13/22 Yes Noemi Chapel, MD  acetaminophen (TYLENOL) 650 MG CR tablet Take 650 mg by mouth every 8 (eight) hours as needed for pain.    [provider]  albuterol (VENTOLIN HFA) 108 (90 Base) MCG/ACT inhaler Inhale 2 puffs into the lungs every 4 (four) hours as needed for wheezing or shortness of breath. 07/21/20   [provider]  ALLERGY RELIEF 10 MG tablet Take 10 mg by mouth daily. 05/23/21   [provider]  aspirin EC 81 MG tablet Take 81 mg by mouth daily. Swallow whole.    [provider]  FLUoxetine (PROZAC) 40 MG capsule  Take 1 capsule (40 mg total) by mouth daily. 11/02/21 01/31/22  [name], MD  hydrochlorothiazide (HYDRODIURIL) 25 MG tablet Take 25 mg by mouth daily. 08/23/20   [provider]  lurasidone (LATUDA) 20 MG TABS tablet Take 1 tablet (20 mg total) by mouth daily. 09/13/21 03/12/22  [name], MD  metFORMIN (GLUCOPHAGE) 500 MG tablet Take 1 tablet (500 mg total) by mouth every evening. 12/05/21 02/03/22  [name], MD  Multiple Vitamins-Minerals (MULTIVITAMIN WITH MINERALS) tablet Take 1 tablet by mouth daily.    [provider]  olmesartan (BENICAR) 5 MG tablet Take 2 tablets (10 mg total) by mouth daily. 12/14/21 01/13/22  Noemi Chapel, MD  omeprazole (PRILOSEC) 20 MG capsule Take 20 mg by mouth daily. 08/25/20   [provider]  SPIRIVA RESPIMAT 1.25 MCG/ACT AERS Inhale 1 puff into the lungs daily. 10/18/20   [provider]  tetrabenazine Delcie Roch) 12.5 MG tablet Take 2 tablets (25 mg total) by mouth daily AND 1 tablet (12.5 mg total) every evening. 01/05/22 02/04/22  [name], MD  torsemide (DEMADEX) 5 MG tablet Take 5 mg by mouth daily.    [provider]  vitamin B-12 (CYANOCOBALAMIN) 1000 MCG tablet Take 1,000 mcg by mouth daily.    [provider]  zaleplon (SONATA) 10 MG capsule Take 1 capsule (10 mg total) by mouth at bedtime as needed for sleep. 12/16/21 01/15/22  [name],  MD      Allergies    Patient has no known allergies.    Review of Systems   Review of Systems  All other systems reviewed and are negative.   Physical Exam Updated Vital Signs BP (!) 182/91   Pulse 70   Temp 98.9 F (37.2 C) (Oral)   Resp (!) 22   Ht 1.626 m ('5\' 4"'$ )   Wt 103.4 kg   SpO2 93%   BMI 39.13 kg/m  Physical Exam Vitals and nursing note reviewed.  Constitutional:      General: She is not in acute distress.    Appearance: She is well-developed.  HENT:     Head: Normocephalic and atraumatic.     Mouth/Throat:     Pharynx: No  oropharyngeal exudate.  Eyes:     General: No scleral icterus.       Right eye: No discharge.        Left eye: No discharge.     Conjunctiva/sclera: Conjunctivae normal.     Pupils: Pupils are equal, round, and reactive to light.  Neck:     Thyroid: No thyromegaly.     Vascular: No JVD.  Cardiovascular:     Rate and Rhythm: Normal rate and regular rhythm.     Heart sounds: Normal heart sounds. No murmur heard.    No friction rub. No gallop.  Pulmonary:     Effort: Pulmonary effort is normal. No respiratory distress.     Breath sounds: Normal breath sounds. No wheezing or rales.  Abdominal:     General: Bowel sounds are normal. There is no distension.     Palpations: Abdomen is soft. There is no mass.     Tenderness: There is no abdominal tenderness.  Musculoskeletal:        General: No tenderness. Normal range of motion.     Cervical back: Normal range of motion and neck supple.  Lymphadenopathy:     Cervical: No cervical adenopathy.  Skin:    General: Skin is warm and dry.     Findings: No erythema or rash.  Neurological:     Mental Status: She is alert.     Coordination: Coordination normal.     Comments: Awake alert following commands moving all 4 extremities with normal coordination normal speech and normal cranial nerves III through XII.  Gross visual acuity is normal  Psychiatric:        Behavior: Behavior normal.     ED Results / Procedures / Treatments   Labs (all labs ordered are listed, but only abnormal results are displayed) Labs Reviewed  COMPREHENSIVE METABOLIC PANEL - Abnormal; Notable for the following components:      Result Value   Potassium 3.3 (*)    All other components within normal limits  CBC WITH DIFFERENTIAL/PLATELET    EKG EKG Interpretation  Date/Time:  Wednesday December 14 2021 16:57:14 EDT Ventricular Rate:  76 PR Interval:  158 QRS Duration: 90 QT Interval:  426 QTC Calculation: 479 R Axis:   -29 Text Interpretation: Normal sinus  rhythm Moderate voltage criteria for LVH, may be normal variant ( R in aVL , Cornell product ) Borderline ECG When compared with ECG of 01-Oct-2020 13:21, ST no longer elevated in Anterior leads Compared with April 2022, no changes seen Confirmed by Noemi Chapel [id number]) on 12/14/2021 5:06:13 PM  Radiology CT Head Wo Contrast  Result Date: 12/14/2021 CLINICAL DATA:  Provided history: Syncope/presyncope, cerebrovascular cause suspected; seeing spots-near syncope. EXAM: CT HEAD  WITHOUT CONTRAST TECHNIQUE: Contiguous axial images were obtained from the base of the skull through the vertex without intravenous contrast. RADIATION DOSE REDUCTION: This exam was performed according to the departmental dose-optimization program which includes automated exposure control, adjustment of the mA and/or kV according to patient size and/or use of iterative reconstruction technique. COMPARISON:  Head CT 06/19/2018. Brain MRI 12/07/2014. Brain MRI 11/30/2018. FINDINGS: Brain: No age advanced or lobar predominant parenchymal atrophy. Mild-to-moderate patchy and ill-defined hypoattenuation within the cerebral white matter, nonspecific. Partially empty sella turcica. There is no acute intracranial hemorrhage. No demarcated cortical infarct. No extra-axial fluid collection. No evidence of an intracranial mass. No midline shift. Vascular: No hyperdense vessel. Atherosclerotic calcifications. Skull: No fracture or aggressive osseous lesion. Sinuses/Orbits: No mass or acute finding within the imaged orbits. No significant paranasal sinus disease at the imaged levels. IMPRESSION: No acute intracranial hemorrhage or evidence of acute infarct. Mild-to-moderate cerebral white matter disease, nonspecific but most often secondary to chronic small vessel ischemia. Partially empty sella turcica. While this finding often reflects incidental anatomic variation, it can also be associated with idiopathic intracranial hypertension (pseudotumor  cerebri). Electronically Signed   By: Kellie Simmering D.O.   On: 12/14/2021 18:18    Procedures Procedures    Medications Ordered in ED Medications  acetaminophen (TYLENOL) tablet 1,000 mg (1,000 mg Oral Given 12/14/21 1755)  oxyCODONE (Oxy IR/ROXICODONE) immediate release tablet 5 mg (5 mg Oral Given 12/14/21 1927)    ED Course/ Medical Decision Making/ A&P                           Medical Decision Making Amount and/or Complexity of Data Reviewed Labs: ordered. Radiology: ordered. ECG/medicine tests: ordered.  Risk OTC drugs. Prescription drug management.   The patient's EKG is unchanged from prior, there is no signs of acute ischemia or arrhythmia or bradycardia.  She has no signs of preexcitation syndromes, no signs of ST elevation concerning for ischemia and she is not having any chest pain.  She does have some hypertension currently measured at 168/92, she is afebrile and has a normal heart rate and a normal cardiac exam.  At this time I will check some labs as well as a CT scan of the brain given that she is having some headaches and blurred vision intermittently.  It is not actually blurred but seeing black spots intermittently, it may be positional.  She has not had lab work done recently we will also do this.  I had a long discussion with the patient and her family members regarding the sources of hypertension especially in someone who continues to smoke.  She was counseled on her tobacco use and is agreeable to attempt stopping.  CT scan results were discussed with the patient and her family member.  She is aware that she needs to follow-up with the neurologist and the phone number will be given for her to see Dr. Merlene Laughter.  She is without any symptoms here except for a mild headache.  She will be given some blood pressure medication for home and I will add amlodipine to her blood pressure regimen.  I have asked her to keep track of her blood pressure and follow-up with her family  doctor.  The rest of her results were unremarkable.  Her mental status is totally normal as is his neurologic exam.  Stable for discharge at this time        Final Clinical Impression(s) / ED  Diagnoses Final diagnoses:  Primary hypertension  Acute nonintractable headache, unspecified headache type    Rx / DC Orders ED Discharge Orders          Ordered    amLODipine (NORVASC) 5 MG tablet  Daily        12/14/21 2017    olmesartan (BENICAR) 5 MG tablet  Daily        12/14/21 2017              Noemi Chapel, MD 12/14/21 2019

## 2021-12-14 NOTE — Discharge Instructions (Signed)
I would like for you to follow-up with the neurologist regarding your symptoms including seeing the black dots and to follow-up with your CAT scan of the brain.  Again there is no signs of tumors or masses or strokes but I think you would benefit from seeing the specialist.  I will give you their phone number above, Dr. Merlene Laughter can be reached at his phone number tomorrow morning to make an arrangement for a follow-up this week.  Please start taking amlodipine once a day in addition to your medication olmesartan which should be taken 10 mg/day, measure your blood pressure once per day 2 hours after taking your medications.  Keep track of this number, share with your doctor when you follow-up within 2 weeks for recheck of your blood pressure.  You do not have to take the torsemide every day for swelling, you can use it only if you are feeling swollen or gaining weight.

## 2021-12-14 NOTE — ED Triage Notes (Signed)
Pt to the ED with hypertension from the [facility]. Pt states she see spots with ambulation.

## 2022-01-02 ENCOUNTER — Other Ambulatory Visit: Payer: Self-pay | Admitting: Psychiatry
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
## 2022-01-06 ENCOUNTER — Other Ambulatory Visit: Payer: Self-pay | Admitting: Psychiatry

## 2022-01-09 ENCOUNTER — Encounter: Payer: Self-pay | Admitting: Psychiatry

## 2022-01-09 ENCOUNTER — Ambulatory Visit (INDEPENDENT_AMBULATORY_CARE_PROVIDER_SITE_OTHER): Payer: Medicare Other | Admitting: Psychiatry

## 2022-01-09 VITALS — BP 175/82 | HR 94 | Temp 98.3°F | Wt 229.0 lb

## 2022-01-09 DIAGNOSIS — G2401 Drug induced subacute dyskinesia: Secondary | ICD-10-CM

## 2022-01-09 DIAGNOSIS — R635 Abnormal weight gain: Secondary | ICD-10-CM | POA: Diagnosis not present

## 2022-01-09 DIAGNOSIS — F331 Major depressive disorder, recurrent, moderate: Secondary | ICD-10-CM

## 2022-01-09 DIAGNOSIS — T50905A Adverse effect of unspecified drugs, medicaments and biological substances, initial encounter: Secondary | ICD-10-CM

## 2022-01-09 DIAGNOSIS — G4700 Insomnia, unspecified: Secondary | ICD-10-CM

## 2022-01-09 MED ORDER — ZALEPLON 10 MG PO CAPS
10.0000 mg | ORAL_CAPSULE | Freq: Every evening | ORAL | 0 refills | Status: DC | PRN
Start: 2022-01-16 — End: 2022-02-13

## 2022-01-09 MED ORDER — FLUOXETINE HCL 40 MG PO CAPS
40.0000 mg | ORAL_CAPSULE | Freq: Every day | ORAL | 0 refills | Status: DC
Start: 2022-02-01 — End: 2022-04-13

## 2022-01-09 MED ORDER — CARIPRAZINE HCL 1.5 MG PO CAPS
1.5000 mg | ORAL_CAPSULE | Freq: Every day | ORAL | 1 refills | Status: DC
Start: 2022-01-09 — End: 2022-02-16

## 2022-01-09 NOTE — Patient Instructions (Addendum)
Continue fluoxetine 40 mg daily  Discontinue Latuda  Start Vraylar 1.5 mg daily  Continue metformin 500 mg in PM Continue tetrabenazine 25 mg daily, 12.5 mg in PM Continue Sonata 10 mg at night  Next appointment- 8/31 at 3:30 , in person

## 2022-01-10 ENCOUNTER — Telehealth: Payer: Self-pay

## 2022-01-10 NOTE — Telephone Encounter (Signed)
received email that a prior auth was needed on the vraylar.  

## 2022-01-10 NOTE — Telephone Encounter (Signed)
went on [email or web address] and submitted the prior auth for the vraylar- pending

## 2022-01-12 ENCOUNTER — Ambulatory Visit: Payer: Medicare Other | Admitting: Psychiatry

## 2022-01-24 ENCOUNTER — Ambulatory Visit: Payer: Medicare Other | Admitting: Cardiology

## 2022-01-24 ENCOUNTER — Encounter: Payer: Self-pay | Admitting: Cardiology

## 2022-01-24 NOTE — Telephone Encounter (Signed)
[place] [id number] was approved to 06-18-22

## 2022-01-24 NOTE — Progress Notes (Deleted)
Clinical Summary Ashley Patrick is a 66 y.o.female seen as a new consult, referred by NP Ashley Patrick for the following medical problems.  1.HFpEF? -10/2021 echo LVEF 60-65%, no WMAs, grade I dd -issues with LE edema, abdominal distension   2. HTN - ER visit 11/2021 with SBPs 200s -    3. Hyperlipidemia   4. Vision changes - sees Dr Ashley Patrick Past Medical History:  Diagnosis Date   Anxiety    Aortic atherosclerosis ([place])    Arthritis    Asthma    Back pain    Bipolar disorder ([place])    Cancer ([place]) 2014   Colon Cancer   Constipation    Depression    Dyspnea    Hypercholesteremia    Hypertension    Insomnia    Lateral epicondylitis  of elbow    right   Migraine headache    Nicotine addiction    Obesity    History of   OD (overdose of drug)    hospitalized for 11 days    Panic attacks    Psychosis (HCC)    Schizophrenia ([place])      No Known Allergies   Current Outpatient Medications  Medication Sig Dispense Refill   acetaminophen (TYLENOL) 650 MG CR tablet Take 650 mg by mouth every 8 (eight) hours as needed for pain.     albuterol (VENTOLIN HFA) 108 (90 Base) MCG/ACT inhaler Inhale 2 puffs into the lungs every 4 (four) hours as needed for wheezing or shortness of breath.     ALLERGY RELIEF 10 MG tablet Take 10 mg by mouth daily.     amLODipine (NORVASC) 5 MG tablet Take 1 tablet (5 mg total) by mouth daily. 30 tablet 0   aspirin EC 81 MG tablet Take 81 mg by mouth daily. Swallow whole.     cariprazine (VRAYLAR) 1.5 MG capsule Take 1 capsule (1.5 mg total) by mouth daily. 30 capsule 1   [START ON 02/01/2022] FLUoxetine (PROZAC) 40 MG capsule Take 1 capsule (40 mg total) by mouth daily. 90 capsule 0   hydrochlorothiazide (HYDRODIURIL) 25 MG tablet Take 25 mg by mouth daily.     lurasidone (LATUDA) 20 MG TABS tablet Take 1 tablet (20 mg total) by mouth daily. 90 tablet 1   metFORMIN (GLUCOPHAGE) 500 MG tablet Take 1 tablet (500 mg total) by mouth every evening. 30  tablet 1   Multiple Vitamins-Minerals (MULTIVITAMIN WITH MINERALS) tablet Take 1 tablet by mouth daily.     olmesartan (BENICAR) 5 MG tablet Take 2 tablets (10 mg total) by mouth daily. 60 tablet 0   omeprazole (PRILOSEC) 20 MG capsule Take 20 mg by mouth daily.     SPIRIVA RESPIMAT 1.25 MCG/ACT AERS Inhale 1 puff into the lungs daily.     tetrabenazine (XENAZINE) 12.5 MG tablet Take 2 tablets (25 mg total) by mouth daily AND 1 tablet (12.5 mg total) every evening. 90 tablet 0   torsemide (DEMADEX) 5 MG tablet Take 5 mg by mouth daily.     vitamin B-12 (CYANOCOBALAMIN) 1000 MCG tablet Take 1,000 mcg by mouth daily.     zaleplon (SONATA) 10 MG capsule Take 1 capsule (10 mg total) by mouth at bedtime as needed for sleep. 30 capsule 0   No current facility-administered medications for this visit.     Past Surgical History:  Procedure Laterality Date   ABDOMINAL HYSTERECTOMY     BIOPSY N/A 08/13/2014   Procedure: BIOPSY;  Surgeon: Ashley Dolin,  MD;  Location: [facility];  Service: Endoscopy;  Laterality: N/A;   BREAST BIOPSY  10/18/2011   Procedure: BREAST BIOPSY;  Surgeon: Ashley So, MD;  Location: [facility];  Service: General;  Laterality: Left;   COLON RESECTION N/A 06/23/2013   Procedure: HAND ASSISTED LAPAROSCOPIC PARTIAL COLECTOMY;  Surgeon: Ashley So, MD;  Location: [facility];  Service: General;  Laterality: N/A;   COLONOSCOPY     COLONOSCOPY N/A 05/29/2013   DGL:OVFIEPP mass most likely representing colorectal cancer S/ P biospy.Multiple colonic and rectal polyps removed/treated as described above. Colonic diverticulosis   COLONOSCOPY WITH PROPOFOL N/A 08/13/2014   RMR: Status post sigmoid colectomy. Multiple colonic polyps removed as described above. Redundant colon. Pan colonic diverticuloisi   COLONOSCOPY WITH PROPOFOL N/A 05/22/2016   Surgeon: Ashley Dolin, MD; 2 polyps removed, but did not survive pathology processing.  Surgical anastomosis in the sigmoid colon noted,  appeared patent, question somewhat narrowed just proximal anastomosis.  Repeat in 3 years.   COLONOSCOPY WITH PROPOFOL N/A 08/05/2021   Procedure: COLONOSCOPY WITH PROPOFOL;  Surgeon: Ashley Harman, DO;  [facility];  Service: Endoscopy;  Laterality: N/A;  10:00am   ESOPHAGEAL DILATION N/A 08/13/2014   Procedure: [place];  Surgeon: Ashley Dolin, MD;  Location: [facility];  Service: Endoscopy;  Laterality: N/A;   ESOPHAGOGASTRODUODENOSCOPY N/A 12/26/2012   IRJ:JOACZYS reflux esophagitis. Gastric and duodenal bulbar erosions-status post gastric biopsynegative H.pylori   ESOPHAGOGASTRODUODENOSCOPY (EGD) WITH PROPOFOL N/A 08/13/2014   RMR: Small benign cystic-appearing lesion in distal esophagus of doubtful clinical significance, otherwise normal esophagus, status post Maloney dilation. Small hiatal hernia, some retained gastric contents (query delayed gastric emptying.)   partial hysterectomy     POLYPECTOMY N/A 08/13/2014   Procedure: POLYPECTOMY;  Surgeon: Ashley Dolin, MD;  Location: [facility];  Service: Endoscopy;  Laterality: N/A;   POLYPECTOMY  08/05/2021   Procedure: POLYPECTOMY;  Surgeon: Ashley Harman, DO;  [facility];  Service: Endoscopy;;     No Known Allergies    Family History  Problem Relation Age of Onset   Stroke Mother    Drug abuse Mother    Arthritis Mother    Arthritis Father    Colon cancer Father        diagnosed in his 71s   Drug abuse Father    Hypertension Sister    Asthma Sister    HIV/AIDS Brother    Colon cancer Paternal Uncle        30s   Anesthesia problems Neg Hx    Hypotension Neg Hx    Malignant hyperthermia Neg Hx    Pseudochol deficiency Neg Hx      Social History [name] reports that she has been smoking cigarettes. She has a 19.00 pack-year smoking history. She has never used smokeless tobacco. [name] reports no history of alcohol use.   Review of Systems CONSTITUTIONAL:  No weight loss, fever, chills, weakness or fatigue.  HEENT: Eyes: No visual loss, blurred vision, double vision or yellow sclerae.No hearing loss, sneezing, congestion, runny nose or sore throat.  SKIN: No rash or itching.  CARDIOVASCULAR:  RESPIRATORY: No shortness of breath, cough or sputum.  GASTROINTESTINAL: No anorexia, nausea, vomiting or diarrhea. No abdominal pain or blood.  GENITOURINARY: No burning on urination, no polyuria NEUROLOGICAL: No headache, dizziness, syncope, paralysis, ataxia, numbness or tingling in the extremities. No change in bowel or bladder control.  MUSCULOSKELETAL: No muscle, back pain,  joint pain or stiffness.  LYMPHATICS: No enlarged nodes. No history of splenectomy.  PSYCHIATRIC: No history of depression or anxiety.  ENDOCRINOLOGIC: No reports of sweating, cold or heat intolerance. No polyuria or polydipsia.  Marland Kitchen   Physical Examination There were no vitals filed for this visit. There were no vitals filed for this visit.  Gen: resting comfortably, no acute distress HEENT: no scleral icterus, pupils equal round and reactive, no palptable cervical adenopathy,  CV Resp: Clear to auscultation bilaterally GI: abdomen is soft, non-tender, non-distended, normal bowel sounds, no hepatosplenomegaly MSK: extremities are warm, no edema.  Skin: warm, no rash Neuro:  no focal deficits Psych: appropriate affect   Diagnostic Studies     Assessment and Plan        Arnoldo Lenis, M.D., F.A.C.C.

## 2022-02-09 ENCOUNTER — Other Ambulatory Visit: Payer: Self-pay | Admitting: Psychiatry

## 2022-02-13 ENCOUNTER — Other Ambulatory Visit: Payer: Self-pay | Admitting: Psychiatry
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
## 2022-02-16 ENCOUNTER — Ambulatory Visit (INDEPENDENT_AMBULATORY_CARE_PROVIDER_SITE_OTHER): Payer: Medicare Other | Admitting: Psychiatry

## 2022-02-16 ENCOUNTER — Encounter: Payer: Self-pay | Admitting: Psychiatry

## 2022-02-16 VITALS — BP 150/89 | HR 92 | Temp 97.7°F | Wt 225.2 lb

## 2022-02-16 DIAGNOSIS — G4700 Insomnia, unspecified: Secondary | ICD-10-CM

## 2022-02-16 DIAGNOSIS — T50905A Adverse effect of unspecified drugs, medicaments and biological substances, initial encounter: Secondary | ICD-10-CM

## 2022-02-16 DIAGNOSIS — R635 Abnormal weight gain: Secondary | ICD-10-CM | POA: Diagnosis not present

## 2022-02-16 DIAGNOSIS — G2401 Drug induced subacute dyskinesia: Secondary | ICD-10-CM | POA: Diagnosis not present

## 2022-02-16 DIAGNOSIS — F331 Major depressive disorder, recurrent, moderate: Secondary | ICD-10-CM

## 2022-02-16 MED ORDER — CARIPRAZINE HCL 1.5 MG PO CAPS: 1.5000 mg | ORAL_CAPSULE | Freq: Every day | ORAL | 1 refills | Status: DC

## 2022-02-16 MED ORDER — TETRABENAZINE 12.5 MG PO TABS
12.5000 mg | ORAL_TABLET | Freq: Two times a day (BID) | ORAL | 1 refills | Status: DC
Start: 2022-02-16 — End: 2022-04-13

## 2022-02-16 MED ORDER — ZALEPLON 10 MG PO CAPS: 10.0000 mg | ORAL_CAPSULE | Freq: Every evening | ORAL | 1 refills | Status: DC | PRN

## 2022-02-16 NOTE — Patient Instructions (Signed)
Continue fluoxetine 40 mg daily  Continue Vraylar 1.5 mg daily  Continue metformin 500 mg in PM Start tetrabenazine 12.5 mg twice a day Continue Sonata 10 mg at night  Next appointment- 10/26 at 2:30

## 2022-03-10 ENCOUNTER — Other Ambulatory Visit: Payer: Self-pay | Admitting: Psychiatry

## 2022-03-15 ENCOUNTER — Other Ambulatory Visit: Payer: Self-pay | Admitting: Psychiatry
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
## 2022-04-13 ENCOUNTER — Telehealth (INDEPENDENT_AMBULATORY_CARE_PROVIDER_SITE_OTHER): Payer: Medicare Other | Admitting: Psychiatry

## 2022-04-13 ENCOUNTER — Encounter: Payer: Self-pay | Admitting: Psychiatry

## 2022-04-13 DIAGNOSIS — T50905A Adverse effect of unspecified drugs, medicaments and biological substances, initial encounter: Secondary | ICD-10-CM

## 2022-04-13 DIAGNOSIS — G2401 Drug induced subacute dyskinesia: Secondary | ICD-10-CM

## 2022-04-13 DIAGNOSIS — G4700 Insomnia, unspecified: Secondary | ICD-10-CM | POA: Diagnosis not present

## 2022-04-13 DIAGNOSIS — R635 Abnormal weight gain: Secondary | ICD-10-CM | POA: Diagnosis not present

## 2022-04-13 DIAGNOSIS — F3341 Major depressive disorder, recurrent, in partial remission: Secondary | ICD-10-CM | POA: Diagnosis not present

## 2022-04-13 MED ORDER — ZALEPLON 10 MG PO CAPS
10.0000 mg | ORAL_CAPSULE | Freq: Every evening | ORAL | 1 refills | Status: DC | PRN
Start: 2022-04-13 — End: 2022-06-22

## 2022-04-13 MED ORDER — TETRABENAZINE 12.5 MG PO TABS
12.5000 mg | ORAL_TABLET | Freq: Two times a day (BID) | ORAL | 0 refills | Status: DC
Start: 2022-04-18 — End: 2022-09-07

## 2022-04-13 MED ORDER — METFORMIN HCL 500 MG PO TABS: 500.0000 mg | ORAL_TABLET | Freq: Every evening | ORAL | 0 refills | Status: DC

## 2022-04-13 MED ORDER — CARIPRAZINE HCL 1.5 MG PO CAPS: 1.5000 mg | ORAL_CAPSULE | Freq: Every day | ORAL | 0 refills | Status: DC

## 2022-04-13 MED ORDER — FLUOXETINE HCL 40 MG PO CAPS
40.0000 mg | ORAL_CAPSULE | Freq: Every day | ORAL | 0 refills | Status: DC
Start: 2022-05-03 — End: 2022-06-22

## 2022-04-13 NOTE — Patient Instructions (Signed)
Continue fluoxetine 40 mg daily  Continue Vraylar 1.5 mg daily  Continue metformin 500 mg in PM Continue tetrabenazine 12.5 mg twice a day Continue Sonata 10 mg at night  Next appointment- 1/4 at 10:30

## 2022-04-14 ENCOUNTER — Other Ambulatory Visit: Payer: Self-pay | Admitting: Psychiatry

## 2022-04-14 NOTE — Telephone Encounter (Signed)
Patient called stating her pharmacy has not received medication prescription for anxiety medication. Please advise

## 2022-04-14 NOTE — Telephone Encounter (Signed)
Ordered

## 2022-04-18 ENCOUNTER — Ambulatory Visit: Payer: Medicare Other | Attending: Cardiology | Admitting: Internal Medicine

## 2022-04-18 ENCOUNTER — Ambulatory Visit: Payer: Medicare Other | Admitting: Cardiology

## 2022-04-18 ENCOUNTER — Telehealth: Payer: Self-pay | Admitting: Internal Medicine

## 2022-04-18 ENCOUNTER — Encounter: Payer: Self-pay | Admitting: Internal Medicine

## 2022-04-18 VITALS — BP 114/72 | HR 89 | Ht 65.0 in | Wt 233.6 lb

## 2022-04-18 DIAGNOSIS — I5033 Acute on chronic diastolic (congestive) heart failure: Secondary | ICD-10-CM

## 2022-04-18 DIAGNOSIS — I5030 Unspecified diastolic (congestive) heart failure: Secondary | ICD-10-CM | POA: Diagnosis not present

## 2022-04-18 DIAGNOSIS — E785 Hyperlipidemia, unspecified: Secondary | ICD-10-CM | POA: Diagnosis not present

## 2022-04-18 MED ORDER — TORSEMIDE 10 MG PO TABS: 10.0000 mg | ORAL_TABLET | Freq: Every day | ORAL | 1 refills | Status: DC

## 2022-04-18 MED ORDER — TORSEMIDE 20 MG PO TABS: 20.0000 mg | ORAL_TABLET | Freq: Every day | ORAL | 1 refills | Status: DC

## 2022-04-18 NOTE — Progress Notes (Signed)
Cardiology Office Note  Date: 04/18/2022   ID: Ashley Patrick, DOB 06-21-1955, MRN [id number]  PCP:  Denyce Robert, FNP  Cardiologist:  Chalmers Guest, MD Electrophysiologist:  None   Reason for Office Visit: Evaluation of shortness of breath at the request of [place], [place].   History of Present Illness: Ashley Patrick is a 66 y.o. female known to have HTN, HLD, nicotine abuse, panic attacks was referred to cardiology clinic for evaluation of shortness of breath at the request of Quintella Reichert, [place]. Patient stated that she has been feeling short of breath for quite a while on minimal exertion and the medication torsemide 5 mg is not helping her. She also reported substernal chest pains x months that occurred with rest/exertion, lasted for 5 to 10 minutes before it resolved spontaneously. Denied any dizziness, lightness, syncope, LE swelling, palpitations. She is planned for infraumbilical panniculectomy sometime soon and her PCP is requesting for cardiology preop clearance.  Patient is trying to quit smoking, currently smoking 1 cigarette, denied alcohol use or illicit drug abuse.  Past Medical History:  Diagnosis Date   Anxiety    Aortic atherosclerosis (HCC)    Arthritis    Asthma    Back pain    Bipolar disorder ([place])    Cancer ([place]) 2014   Colon Cancer   Constipation    Depression    Dyspnea    Hypercholesteremia    Hypertension    Insomnia    Lateral epicondylitis  of elbow    right   Migraine headache    Nicotine addiction    Obesity    History of   OD (overdose of drug)    hospitalized for 11 days    Panic attacks    Psychosis ([place])    Schizophrenia ([place])     Past Surgical History:  Procedure Laterality Date   ABDOMINAL HYSTERECTOMY     BIOPSY N/A 08/13/2014   Procedure: BIOPSY;  Surgeon: Daneil Dolin, MD;  Location: [facility];  Service: Endoscopy;  Laterality: N/A;   BREAST BIOPSY  10/18/2011   Procedure: BREAST BIOPSY;  Surgeon: Jamesetta So, MD;   Location: [facility];  Service: General;  Laterality: Left;   COLON RESECTION N/A 06/23/2013   Procedure: HAND ASSISTED LAPAROSCOPIC PARTIAL COLECTOMY;  Surgeon: Jamesetta So, MD;  Location: [facility];  Service: General;  Laterality: N/A;   COLONOSCOPY     COLONOSCOPY N/A 05/29/2013   [id number] mass most likely representing colorectal cancer S/ P biospy.Multiple colonic and rectal polyps removed/treated as described above. Colonic diverticulosis   COLONOSCOPY WITH PROPOFOL N/A 08/13/2014   RMR: Status post sigmoid colectomy. Multiple colonic polyps removed as described above. Redundant colon. Pan colonic diverticuloisi   COLONOSCOPY WITH PROPOFOL N/A 05/22/2016   Surgeon: Daneil Dolin, MD; 2 polyps removed, but did not survive pathology processing.  Surgical anastomosis in the sigmoid colon noted, appeared patent, question somewhat narrowed just proximal anastomosis.  Repeat in 3 years.   COLONOSCOPY WITH PROPOFOL N/A 08/05/2021   Procedure: COLONOSCOPY WITH PROPOFOL;  Surgeon: Eloise Harman, DO;  [facility];  Service: Endoscopy;  Laterality: N/A;  10:00am   ESOPHAGEAL DILATION N/A 08/13/2014   Procedure: [place];  Surgeon: Daneil Dolin, MD;  Location: [facility];  Service: Endoscopy;  Laterality: N/A;   ESOPHAGOGASTRODUODENOSCOPY N/A 12/26/2012   [id number] reflux esophagitis. Gastric and duodenal bulbar erosions-status post gastric biopsynegative H.pylori   ESOPHAGOGASTRODUODENOSCOPY (EGD) WITH PROPOFOL N/A 08/13/2014  RMR: Small benign cystic-appearing lesion in distal esophagus of doubtful clinical significance, otherwise normal esophagus, status post Maloney dilation. Small hiatal hernia, some retained gastric contents (query delayed gastric emptying.)   partial hysterectomy     POLYPECTOMY N/A 08/13/2014   Procedure: POLYPECTOMY;  Surgeon: Daneil Dolin, MD;  Location: [facility];  Service: Endoscopy;  Laterality: N/A;   POLYPECTOMY  08/05/2021    Procedure: POLYPECTOMY;  Surgeon: Eloise Harman, DO;  [facility];  Service: Endoscopy;;    Current Outpatient Medications  Medication Sig Dispense Refill   acetaminophen (TYLENOL) 650 MG CR tablet Take 650 mg by mouth every 8 (eight) hours as needed for pain.     albuterol (VENTOLIN HFA) 108 (90 Base) MCG/ACT inhaler Inhale 2 puffs into the lungs every 4 (four) hours as needed for wheezing or shortness of breath.     ALLERGY RELIEF 10 MG tablet Take 10 mg by mouth daily.     amLODipine (NORVASC) 5 MG tablet Take 1 tablet (5 mg total) by mouth daily. 30 tablet 0   aspirin EC 81 MG tablet Take 81 mg by mouth daily. Swallow whole.     [START ON 05/11/2022] cariprazine (VRAYLAR) 1.5 MG capsule Take 1 capsule (1.5 mg total) by mouth daily. 90 capsule 0   [START ON 05/03/2022] FLUoxetine (PROZAC) 40 MG capsule Take 1 capsule (40 mg total) by mouth daily. 90 capsule 0   hydrochlorothiazide (HYDRODIURIL) 25 MG tablet Take 25 mg by mouth daily.     hydrOXYzine (ATARAX) 25 MG tablet Take 1 tablet (25 mg total) by mouth daily as needed. for anxiety 30 tablet 1   metFORMIN (GLUCOPHAGE) 500 MG tablet Take 1 tablet (500 mg total) by mouth every evening. 90 tablet 0   Multiple Vitamins-Minerals (MULTIVITAMIN WITH MINERALS) tablet Take 1 tablet by mouth daily.     olmesartan (BENICAR) 5 MG tablet Take 2 tablets (10 mg total) by mouth daily. 60 tablet 0   omeprazole (PRILOSEC) 20 MG capsule Take 20 mg by mouth daily.     SPIRIVA RESPIMAT 1.25 MCG/ACT AERS Inhale 1 puff into the lungs daily.     tetrabenazine (XENAZINE) 12.5 MG tablet Take 1 tablet (12.5 mg total) by mouth 2 (two) times daily. 180 tablet 0   vitamin B-12 (CYANOCOBALAMIN) 1000 MCG tablet Take 1,000 mcg by mouth daily.     zaleplon (SONATA) 10 MG capsule Take 1 capsule (10 mg total) by mouth at bedtime as needed for sleep. 30 capsule 1   torsemide (DEMADEX) 10 MG tablet Take 1 tablet (10 mg total) by mouth daily. 90 tablet 1    No current facility-administered medications for this visit.   Allergies:  Patient has no known allergies.   Social History: The patient  reports that she has been smoking cigarettes. She has a 19.00 pack-year smoking history. She has never used smokeless tobacco. She reports that she does not drink alcohol and does not use drugs.   Family History: The patient's family history includes Arthritis in her father and mother; Asthma in her sister; Colon cancer in her father and paternal uncle; Drug abuse in her father and mother; HIV/AIDS in her brother; Hypertension in her sister; Stroke in her mother.   ROS:  Please see the history of present illness. Otherwise, complete review of systems is positive for none.  All other systems are reviewed and negative.   Physical Exam: VS:  BP 114/72   Pulse 89   Ht '5\' 5"'$  (1.651  m)   Wt 233 lb 9.6 oz (106 kg)   SpO2 92%   BMI 38.87 kg/m , BMI Body mass index is 38.87 kg/m.  Wt Readings from Last 3 Encounters:  04/18/22 233 lb 9.6 oz (106 kg)  12/14/21 227 lb 15.3 oz (103.4 kg)  08/02/21 212 lb (96.2 kg)    General: Patient appears comfortable at rest. HEENT: Conjunctiva and lids normal, oropharynx clear with moist mucosa. Neck: Supple, JVD unable to examine due to body habitus Lungs: Clear to auscultation, nonlabored breathing at rest. Cardiac: Regular rate and rhythm, no S3 or significant systolic murmur, no pericardial rub. Abdomen: Soft, nontender, no hepatomegaly, bowel sounds present, no guarding or rebound. Extremities: No pitting edema, distal pulses 2+. Skin: Warm and dry. Musculoskeletal: No kyphosis. Neuropsychiatric: Alert and oriented x3, affect grossly appropriate.  ECG:  An ECG dated 12/15/21 was personally reviewed today and demonstrated:  Normal sinus rhythm  Recent Labwork: 12/14/2021: ALT 18; AST 15; BUN 16; Creatinine, Ser 0.89; Hemoglobin 12.9; Platelets 183; Potassium 3.3; Sodium 141     Component Value Date/Time    CHOL 256 (H) 01/01/2017 0604   TRIG 177 (H) 01/01/2017 0604   HDL 56 01/01/2017 0604   CHOLHDL 4.6 01/01/2017 0604   VLDL 35 01/01/2017 0604   LDLCALC 165 (H) 01/01/2017 0604    Other Studies Reviewed Today: Echo in May 2023  1. Left ventricular ejection fraction, by estimation, is 60 to 65%. The  left ventricle has normal function. The left ventricle has no regional  wall motion abnormalities. There is mild left ventricular hypertrophy.  Left ventricular diastolic parameters  are consistent with Grade I diastolic dysfunction (impaired relaxation).   2. Right ventricular systolic function is normal. The right ventricular  size is normal. There is normal pulmonary artery systolic pressure. The  estimated right ventricular systolic pressure is 21.1 mmHg.   3. Left atrial size was moderately dilated.   4. The mitral valve is grossly normal. Trivial mitral valve  regurgitation.   5. The aortic valve is tricuspid. Aortic valve regurgitation is not  visualized.   6. The inferior vena cava is normal in size with greater than 50%  respiratory variability, suggesting right atrial pressure of 3 mmHg.  Assessment and Plan: Patient is a 66 year old F known to have HTN, HLD, nicotine abuse, panic attacks was referred to cardiology clinic for evaluation of shortness of breath request of [place], [place].  #HFpEF Plan -Increase torsemide dose from 5 mg to 10 mg once daily. Instructed patient to take additional dose of torsemide if she continues to feel SOB or has LE swelling. Obtain urine sodium in 5 days to assess response. Patient is also instructed to make a sooner appointment if her SOB does not get better with the current increased dosage. -ER precautions for worsening shortness of breath provided.  #Preop cardiac risk stratification for infraumbilical panniculectomy Plan -Patient currently has DOE with minimal exertion likely secondary to HFpEF. I will increase torsemide dose from 5 mg to 10  mg once daily and instructions placed to take additional dose if required. Due to limited METS, patient will need exercise Myoview after 1 month or after she is compensated with adequate diuresis. If she is not able to perform exercise Myoview after 1 month, we will switch to pharmacological nuclear stress test. Further recommendations on the preop cardiac risk ratification pending stress test results.  #HTN, controlled Plan -Continue amlodipine 5 mg once daily  #HLD, not at goal (goal less than 100)  Plan -We will obtain repeat lipid panel and start moderate intensity statin if still not at goal  I have spent a total of 45 minutes with patient reviewing chart , EKGs, labs and examining patient as well as establishing an assessment and plan that was discussed with the patient.  > 50% of time was spent in direct patient care.      Medication Adjustments/Labs and Tests Ordered: Current medicines are reviewed at length with the patient today.  Concerns regarding medicines are outlined above.   Tests Ordered: Orders Placed This Encounter  Procedures   NM Myocar Multi W/Spect W/Wall Motion / EF   Sodium, urine, random    Medication Changes: Meds ordered this encounter  Medications   DISCONTD: torsemide (DEMADEX) 20 MG tablet    Sig: Take 1 tablet (20 mg total) by mouth daily.    Dispense:  90 tablet    Refill:  1    04/18/22 Dose Increase   torsemide (DEMADEX) 10 MG tablet    Sig: Take 1 tablet (10 mg total) by mouth daily.    Dispense:  90 tablet    Refill:  1    04/18/22 Dose Increase    Disposition:  Follow up  2 months  Signed Annalie Wenner Fidel Levy, MD, 04/18/2022 1:20 PM    [facility] [employer], [place], [place] [id number]

## 2022-04-18 NOTE — Telephone Encounter (Signed)
Checking percert on the following patient for testing scheduled at [facility].     EXERCISE MYOVIEW    05-15-2022

## 2022-04-18 NOTE — Addendum Note (Signed)
Addended by: Sung Amabile on: 04/18/2022 04:30 PM   Modules accepted: Orders

## 2022-04-18 NOTE — Patient Instructions (Addendum)
Medication Instructions:  Your physician has recommended you make the following change in your medication:  Increase torsemide to 10 mg once a day Continue all other medications as directed  Labwork: Urine specimen Deneise Lever [place] 5 days from today (04/24/22)  Testing/Procedures: Your physician has requested that you have en exercise stress myoview. For further information please visit [email or web address]. Please follow instruction sheet, as given.   Follow-Up:  Your physician recommends that you schedule a follow-up appointment in: 3 months  Any Other Special Instructions Will Be Listed Below (If Applicable).  If you need a refill on your cardiac medications before your next appointment, please call your pharmacy.

## 2022-04-19 ENCOUNTER — Other Ambulatory Visit: Payer: Self-pay | Admitting: Psychiatry

## 2022-04-19 ENCOUNTER — Telehealth: Payer: Self-pay | Admitting: *Deleted

## 2022-04-19 NOTE — Telephone Encounter (Signed)
Patient called for Refill Request -- metFORMIN (GLUCOPHAGE) 500 MG tablet Take 1 tablet (500 mg total) by mouth  every evening

## 2022-04-19 NOTE — Telephone Encounter (Signed)
Could you contact the pharmacy. It was sent in already.

## 2022-04-20 NOTE — Telephone Encounter (Signed)
Called to inform Patient that the Rx had prescription on file & they are filling it this A.M.

## 2022-04-24 ENCOUNTER — Other Ambulatory Visit (HOSPITAL_COMMUNITY)
Admission: RE | Admit: 2022-04-24 | Discharge: 2022-04-24 | Disposition: A | Discharge status: Home / Self Care | Payer: Medicare Other | Source: Ambulatory Visit | Attending: Internal Medicine | Admitting: Internal Medicine

## 2022-04-24 DIAGNOSIS — I5030 Unspecified diastolic (congestive) heart failure: Secondary | ICD-10-CM | POA: Diagnosis present

## 2022-04-24 LAB — LIPID PANEL
Cholesterol: 258 mg/dL — ABNORMAL HIGH (ref 0–200)
HDL: 50 mg/dL (ref >40)
LDL Cholesterol: 172 mg/dL — ABNORMAL HIGH (ref 0–99)
Total CHOL/HDL Ratio: 5.2 RATIO
Triglycerides: 178 mg/dL — ABNORMAL HIGH (ref <150)
VLDL: 36 mg/dL (ref 0–40)

## 2022-04-24 LAB — SODIUM, URINE, RANDOM: Sodium, Ur: 71 mmol/L

## 2022-04-25 ENCOUNTER — Telehealth: Payer: Self-pay | Admitting: *Deleted

## 2022-04-25 MED ORDER — ROSUVASTATIN CALCIUM 10 MG PO TABS: 10.0000 mg | ORAL_TABLET | Freq: Every day | ORAL | 1 refills | Status: DC

## 2022-04-25 NOTE — Telephone Encounter (Signed)
-----   Message from Chalmers Guest, MD sent at 04/24/2022  1:17 PM EST ----- LDL is persistently elevated. Start Rosuvastatin '10mg'$  [facility].

## 2022-04-25 NOTE — Telephone Encounter (Signed)
Patient informed and verbalized understanding of plan. Copy sent to PCP 

## 2022-04-25 NOTE — Telephone Encounter (Signed)
-----   Message from Chalmers Guest, MD sent at 04/24/2022  1:18 PM EST ----- Normal Na levels in urine, adequate diuretic response.

## 2022-05-15 ENCOUNTER — Encounter (HOSPITAL_COMMUNITY)
Admission: RE | Admit: 2022-05-15 | Discharge: 2022-05-15 | Disposition: A | Discharge status: Home / Self Care | Payer: Medicare Other | Source: Ambulatory Visit | Attending: Internal Medicine | Admitting: Internal Medicine

## 2022-05-15 ENCOUNTER — Ambulatory Visit (HOSPITAL_COMMUNITY)
Admission: RE | Admit: 2022-05-15 | Discharge: 2022-05-15 | Disposition: A | Discharge status: Home / Self Care | Payer: Medicare Other | Source: Ambulatory Visit | Attending: Internal Medicine | Admitting: Internal Medicine

## 2022-05-15 DIAGNOSIS — I5030 Unspecified diastolic (congestive) heart failure: Secondary | ICD-10-CM | POA: Insufficient documentation

## 2022-05-15 LAB — NM MYOCAR MULTI W/SPECT W/WALL MOTION / EF
Angina Index: 0
Base ST Depression (mm): 0 mm
Duke Treadmill Score: 5
Estimated workload: 6.8
Exercise duration (min): 5 min
Exercise duration (sec): 22 s
LV dias vol: 87 mL (ref 46–106)
LV sys vol: 41 mL
MPHR: 136 {beats}/min
Nuc Stress EF: 53 %
Peak HR: 136 {beats}/min
Percent HR: 87 %
RATE: 0.5
RPE: 16
Rest HR: 74 {beats}/min
Rest Nuclear Isotope Dose: 10 mCi
SDS: 1
SRS: 0
SSS: 1
ST Depression (mm): 0 mm
Stress Nuclear Isotope Dose: 30 mCi
TID: 0.86

## 2022-05-15 MED ORDER — TECHNETIUM TC 99M TETROFOSMIN IV KIT
10.0000 | PACK | Freq: Once | INTRAVENOUS | Status: AC | PRN
Start: 2022-05-15 — End: 2022-05-15
  Administered 2022-05-15: 10 via INTRAVENOUS

## 2022-05-15 MED ORDER — REGADENOSON 0.4 MG/5ML IV SOLN
INTRAVENOUS | Status: AC
  Filled 2022-05-15: qty 5

## 2022-05-15 MED ORDER — TECHNETIUM TC 99M TETROFOSMIN IV KIT
30.0000 | PACK | Freq: Once | INTRAVENOUS | Status: AC | PRN
  Administered 2022-05-15: 30 via INTRAVENOUS

## 2022-05-15 MED ORDER — SODIUM CHLORIDE FLUSH 0.9 % IV SOLN
INTRAVENOUS | Status: AC
  Administered 2022-05-15: 10 mL via INTRAVENOUS
  Filled 2022-05-15: qty 10

## 2022-05-31 ENCOUNTER — Telehealth: Payer: Self-pay

## 2022-05-31 NOTE — Telephone Encounter (Signed)
vraylar approved to 06-19-23 PA # [id number]

## 2022-06-20 ENCOUNTER — Other Ambulatory Visit: Payer: Self-pay | Admitting: Psychiatry

## 2022-06-20 ENCOUNTER — Other Ambulatory Visit (HOSPITAL_COMMUNITY): Payer: Self-pay | Admitting: Family Medicine

## 2022-06-20 DIAGNOSIS — M79604 Pain in right leg: Secondary | ICD-10-CM

## 2022-06-20 DIAGNOSIS — R609 Edema, unspecified: Secondary | ICD-10-CM
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
## 2022-06-22 ENCOUNTER — Encounter: Payer: Self-pay | Admitting: Psychiatry

## 2022-06-22 ENCOUNTER — Ambulatory Visit (INDEPENDENT_AMBULATORY_CARE_PROVIDER_SITE_OTHER): Payer: 59 | Admitting: Psychiatry

## 2022-06-22 VITALS — BP 138/86 | HR 89 | Temp 98.3°F | Ht 64.25 in | Wt 241.0 lb

## 2022-06-22 DIAGNOSIS — R635 Abnormal weight gain: Secondary | ICD-10-CM | POA: Diagnosis not present

## 2022-06-22 DIAGNOSIS — G4700 Insomnia, unspecified: Secondary | ICD-10-CM

## 2022-06-22 DIAGNOSIS — G2401 Drug induced subacute dyskinesia: Secondary | ICD-10-CM | POA: Diagnosis not present

## 2022-06-22 DIAGNOSIS — T50905A Adverse effect of unspecified drugs, medicaments and biological substances, initial encounter: Secondary | ICD-10-CM

## 2022-06-22 DIAGNOSIS — F330 Major depressive disorder, recurrent, mild: Secondary | ICD-10-CM

## 2022-06-22 MED ORDER — ZALEPLON 5 MG PO CAPS
5.0000 mg | ORAL_CAPSULE | Freq: Every evening | ORAL | 2 refills | Status: DC | PRN
Start: 2022-06-22 — End: 2023-01-08

## 2022-06-22 MED ORDER — CARIPRAZINE HCL 1.5 MG PO CAPS: 1.5000 mg | ORAL_CAPSULE | Freq: Every day | ORAL | 0 refills | Status: AC

## 2022-06-22 MED ORDER — FLUOXETINE HCL 40 MG PO CAPS
40.0000 mg | ORAL_CAPSULE | Freq: Every day | ORAL | 1 refills | Status: DC
Start: 2022-08-02 — End: 2022-06-23

## 2022-06-22 MED ORDER — METFORMIN HCL 500 MG PO TABS
500.0000 mg | ORAL_TABLET | Freq: Every evening | ORAL | 0 refills | Status: DC
Start: 2022-07-13 — End: 2022-07-11

## 2022-06-22 NOTE — Patient Instructions (Signed)
Continue fluoxetine 40 mg daily  Continue Vraylar 1.5 mg daily  Continue metformin 500 mg in PM Continue tetrabenazine 12.5 mg daily Decrease Sonata 5 mg at night  Next appointment- 3/21 at 10:30

## 2022-06-23 ENCOUNTER — Encounter: Payer: Self-pay | Admitting: Internal Medicine

## 2022-06-23 ENCOUNTER — Ambulatory Visit (INDEPENDENT_AMBULATORY_CARE_PROVIDER_SITE_OTHER): Payer: 59 | Admitting: Internal Medicine

## 2022-06-23 ENCOUNTER — Ambulatory Visit (HOSPITAL_COMMUNITY)
Admission: RE | Admit: 2022-06-23 | Discharge: 2022-06-23 | Disposition: A | Discharge status: Home / Self Care | Payer: 59 | Source: Ambulatory Visit | Attending: Family Medicine | Admitting: Family Medicine

## 2022-06-23 VITALS — BP 134/86 | HR 89 | Ht 64.0 in | Wt 241.4 lb

## 2022-06-23 DIAGNOSIS — Z79899 Other long term (current) drug therapy: Secondary | ICD-10-CM

## 2022-06-23 DIAGNOSIS — R609 Edema, unspecified: Secondary | ICD-10-CM | POA: Insufficient documentation

## 2022-06-23 DIAGNOSIS — M79604 Pain in right leg: Secondary | ICD-10-CM | POA: Diagnosis present

## 2022-06-23 DIAGNOSIS — I5030 Unspecified diastolic (congestive) heart failure: Secondary | ICD-10-CM | POA: Diagnosis present

## 2022-06-23 MED ORDER — POTASSIUM CHLORIDE CRYS ER 20 MEQ PO TBCR: 20.0000 meq | EXTENDED_RELEASE_TABLET | Freq: Every day | ORAL | 3 refills | Status: AC

## 2022-06-23 MED ORDER — FUROSEMIDE 80 MG PO TABS: 80.0000 mg | ORAL_TABLET | Freq: Every morning | ORAL | 3 refills | Status: DC

## 2022-06-23 NOTE — Patient Instructions (Addendum)
Medication Instructions:  Your physician has recommended you make the following change in your medication:  Stop amlodipine 10 mg Stop torsemide Start furosemide 80 mg in the morning and 80 mg at 5:00 pm Start potassium 20 meq daily Continue other medications the same  Labwork: BMET in 5 days (06/28/2022) Non-fasting [facility] ([place]. [place])  Testing/Procedures: none  Follow-Up: Your physician recommends that you schedule a follow-up appointment in: 3 months with [facility] Your physician recommends that you schedule a follow-up appointment in: 5 days with Finis Bud NP  Any Other Special Instructions Will Be Listed Below (If Applicable).  If you need a refill on your cardiac medications before your next appointment, please call your pharmacy.

## 2022-06-25 NOTE — Progress Notes (Signed)
Cardiology Office Note  Date: 06/25/2022   ID: Ashley Patrick, DOB 1955/11/03, MRN [id number]  PCP:  Denyce Robert, FNP  Cardiologist:  Chalmers Guest, MD Electrophysiologist:  None   Reason for Office Visit: Follow-up of HFpEF  History of Present Illness: Ashley Patrick is a 67 y.o. female known to have HTN, HLD, nicotine abuse, panic attacks  presented to the cardiology clinic for follow-up of HFpEF. Patient continues to have DOE and new finding of bilateral leg swelling despite increasing torsemide dose to 10 mg twice daily. She is not able to walk even a few steps without feeling SOB. Denied angina, dizziness, lightness, syncope, palpitations. She is planned for infraumbilical panniculectomy sometime soon and her PCP is requesting for cardiology preop clearance. Patient is trying to quit smoking and currently switched from smoking to vaping.,  Denied alcohol use and illicit drug abuse.  Past Medical History:  Diagnosis Date   Anxiety    Aortic atherosclerosis (HCC)    Arthritis    Asthma    Back pain    Bipolar disorder ([place])    Cancer ([place]) 2014   Colon Cancer   Constipation    Depression    Dyspnea    Hypercholesteremia    Hypertension    Insomnia    Lateral epicondylitis  of elbow    right   Migraine headache    Nicotine addiction    Obesity    History of   OD (overdose of drug)    hospitalized for 11 days    Panic attacks    Psychosis ([place])    Schizophrenia ([place])     Past Surgical History:  Procedure Laterality Date   ABDOMINAL HYSTERECTOMY     BIOPSY N/A 08/13/2014   Procedure: BIOPSY;  Surgeon: Daneil Dolin, MD;  Location: [facility];  Service: Endoscopy;  Laterality: N/A;   BREAST BIOPSY  10/18/2011   Procedure: BREAST BIOPSY;  Surgeon: Jamesetta So, MD;  Location: [facility];  Service: General;  Laterality: Left;   COLON RESECTION N/A 06/23/2013   Procedure: HAND ASSISTED LAPAROSCOPIC PARTIAL COLECTOMY;  Surgeon: Jamesetta So, MD;  Location:  [facility];  Service: General;  Laterality: N/A;   COLONOSCOPY     COLONOSCOPY N/A 05/29/2013   [id number] mass most likely representing colorectal cancer S/ P biospy.Multiple colonic and rectal polyps removed/treated as described above. Colonic diverticulosis   COLONOSCOPY WITH PROPOFOL N/A 08/13/2014   RMR: Status post sigmoid colectomy. Multiple colonic polyps removed as described above. Redundant colon. Pan colonic diverticuloisi   COLONOSCOPY WITH PROPOFOL N/A 05/22/2016   Surgeon: Daneil Dolin, MD; 2 polyps removed, but did not survive pathology processing.  Surgical anastomosis in the sigmoid colon noted, appeared patent, question somewhat narrowed just proximal anastomosis.  Repeat in 3 years.   COLONOSCOPY WITH PROPOFOL N/A 08/05/2021   Procedure: COLONOSCOPY WITH PROPOFOL;  Surgeon: Eloise Harman, DO;  [facility]: [facility];  Service: Endoscopy;  Laterality: N/A;  10:00am   ESOPHAGEAL DILATION N/A 08/13/2014   Procedure: [place];  Surgeon: Daneil Dolin, MD;  Location: [facility];  Service: Endoscopy;  Laterality: N/A;   ESOPHAGOGASTRODUODENOSCOPY N/A 12/26/2012   [id number] reflux esophagitis. Gastric and duodenal bulbar erosions-status post gastric biopsynegative H.pylori   ESOPHAGOGASTRODUODENOSCOPY (EGD) WITH PROPOFOL N/A 08/13/2014   RMR: Small benign cystic-appearing lesion in distal esophagus of doubtful clinical significance, otherwise normal esophagus, status post Maloney dilation. Small hiatal hernia, some retained gastric contents (query delayed  gastric emptying.)   partial hysterectomy     POLYPECTOMY N/A 08/13/2014   Procedure: POLYPECTOMY;  Surgeon: Daneil Dolin, MD;  Location: [facility];  Service: Endoscopy;  Laterality: N/A;   POLYPECTOMY  08/05/2021   Procedure: POLYPECTOMY;  Surgeon: Eloise Harman, DO;  [facility];  Service: Endoscopy;;    Current Outpatient Medications  Medication Sig Dispense Refill   acetaminophen  (TYLENOL) 650 MG CR tablet Take 650 mg by mouth every 8 (eight) hours as needed for pain.     albuterol (VENTOLIN HFA) 108 (90 Base) MCG/ACT inhaler Inhale 2 puffs into the lungs every 4 (four) hours as needed for wheezing or shortness of breath.     ALLERGY RELIEF 10 MG tablet Take 10 mg by mouth daily.     amLODipine (NORVASC) 5 MG tablet Take 5 mg by mouth daily.     aspirin EC 81 MG tablet Take 81 mg by mouth daily. Swallow whole.     [START ON 08/10/2022] cariprazine (VRAYLAR) 1.5 MG capsule Take 1 capsule (1.5 mg total) by mouth daily. 90 capsule 0   celecoxib (CELEBREX) 100 MG capsule Take 100 mg by mouth 2 (two) times daily as needed.     furosemide (LASIX) 80 MG tablet Take 1 tablet (80 mg total) by mouth in the morning. & 80 mg at 5:00 pm 60 tablet 3   [START ON 07/13/2022] metFORMIN (GLUCOPHAGE) 500 MG tablet Take 1 tablet (500 mg total) by mouth every evening. 90 tablet 0   Multiple Vitamins-Minerals (MULTIVITAMIN WITH MINERALS) tablet Take 1 tablet by mouth daily.     olmesartan (BENICAR) 5 MG tablet Take 2 tablets (10 mg total) by mouth daily. (Patient taking differently: Take 5 mg by mouth daily.) 60 tablet 0   omeprazole (PRILOSEC) 20 MG capsule Take 20 mg by mouth daily.     potassium chloride SA (KLOR-CON M) 20 MEQ tablet Take 1 tablet (20 mEq total) by mouth daily. 30 tablet 3   rosuvastatin (CRESTOR) 10 MG tablet Take 1 tablet (10 mg total) by mouth daily. 90 tablet 1   SPIRIVA RESPIMAT 1.25 MCG/ACT AERS Inhale 1 puff into the lungs daily.     tetrabenazine (XENAZINE) 12.5 MG tablet Take 1 tablet (12.5 mg total) by mouth 2 (two) times daily. 180 tablet 0   traZODone (DESYREL) 150 MG tablet Take 300 mg by mouth at bedtime.     vitamin B-12 (CYANOCOBALAMIN) 1000 MCG tablet Take 1,000 mcg by mouth daily.     zaleplon (SONATA) 5 MG capsule Take 1 capsule (5 mg total) by mouth at bedtime as needed for sleep. 30 capsule 2   No current facility-administered medications for this visit.    Allergies:  Patient has no known allergies.   Social History: The patient  reports that she quit smoking 6 days ago. Her smoking use included cigarettes. She has a 19.00 pack-year smoking history. She has never used smokeless tobacco. She reports that she does not drink alcohol and does not use drugs.   Family History: The patient's family history includes Arthritis in her father and mother; Asthma in her sister; Colon cancer in her father and paternal uncle; Drug abuse in her father and mother; HIV/AIDS in her brother; Hypertension in her sister; Stroke in her mother.   ROS:  Please see the history of present illness. Otherwise, complete review of systems is positive for none.  All other systems are reviewed and negative.   Physical Exam: VS:  BP  134/86   Pulse 89   Ht '5\' 4"'$  (1.626 m)   Wt 241 lb 6.4 oz (109.5 kg)   SpO2 95%   BMI 41.44 kg/m , BMI Body mass index is 41.44 kg/m.  Wt Readings from Last 3 Encounters:  06/23/22 241 lb 6.4 oz (109.5 kg)  04/18/22 233 lb 9.6 oz (106 kg)  12/14/21 227 lb 15.3 oz (103.4 kg)    General: Patient appears comfortable at rest. HEENT: Conjunctiva and lids normal, oropharynx clear with moist mucosa. Neck: Supple, JVD unable to examine due to body habitus Lungs: Clear to auscultation, nonlabored breathing at rest. Cardiac: Regular rate and rhythm, no S3 or significant systolic murmur, no pericardial rub. Abdomen: Soft, nontender, no hepatomegaly, bowel sounds present, no guarding or rebound. Extremities: 1-2+ pitting edema, distal pulses 2+. Skin: Warm and dry. Musculoskeletal: No kyphosis. Neuropsychiatric: Alert and oriented x3, affect grossly appropriate.  ECG:  An ECG dated 12/15/21 was personally reviewed today and demonstrated:  Normal sinus rhythm  Recent Labwork: 12/14/2021: ALT 18; AST 15; BUN 16; Creatinine, Ser 0.89; Hemoglobin 12.9; Platelets 183; Potassium 3.3; Sodium 141     Component Value Date/Time   CHOL 258 (H)  04/24/2022 1211   TRIG 178 (H) 04/24/2022 1211   HDL 50 04/24/2022 1211   CHOLHDL 5.2 04/24/2022 1211   VLDL 36 04/24/2022 1211   LDLCALC 172 (H) 04/24/2022 1211    Other Studies Reviewed Today: Echo in May 2023  1. Left ventricular ejection fraction, by estimation, is 60 to 65%. The  left ventricle has normal function. The left ventricle has no regional  wall motion abnormalities. There is mild left ventricular hypertrophy.  Left ventricular diastolic parameters  are consistent with Grade I diastolic dysfunction (impaired relaxation).   2. Right ventricular systolic function is normal. The right ventricular  size is normal. There is normal pulmonary artery systolic pressure. The  estimated right ventricular systolic pressure is 97.9 mmHg.   3. Left atrial size was moderately dilated.   4. The mitral valve is grossly normal. Trivial mitral valve  regurgitation.   5. The aortic valve is tricuspid. Aortic valve regurgitation is not  visualized.   6. The inferior vena cava is normal in size with greater than 50%  respiratory variability, suggesting right atrial pressure of 3 mmHg.  Assessment and Plan: Patient is a 67 year old F known to have HTN, HLD, nicotine abuse, panic attacks was referred to cardiology clinic for evaluation of shortness of breath request of [place], [place].  #HFpEF Plan -Patient continued to have DOE despite increasing torsemide dose from 5 mg to 10 mg twice daily. Stop torsemide and start Lasix 80 mg twice daily. Start KCl 20 mEq once daily. Obtain BMP in 5 days.  #Preop cardiac risk stratification for infraumbilical panniculectomy Plan -Patient currently has DOE with minimal exertion likely secondary to HFpEF.  She needs to be volume optimized prior to undergoing any invasive procedure.  #HTN, controlled Plan -Continue amlodipine 5 mg once daily  #HLD, not at goal (goal less than 100) Plan -Continue rosuvastatin 10 mg nightly.  I have spent a total of  33 minutes with patient reviewing chart , EKGs, labs and examining patient as well as establishing an assessment and plan that was discussed with the patient.  > 50% of time was spent in direct patient care.      Medication Adjustments/Labs and Tests Ordered: Current medicines are reviewed at length with the patient today.  Concerns regarding medicines are outlined above.  Tests Ordered: Orders Placed This Encounter  Procedures   Basic metabolic panel    Medication Changes: Meds ordered this encounter  Medications   furosemide (LASIX) 80 MG tablet    Sig: Take 1 tablet (80 mg total) by mouth in the morning. & 80 mg at 5:00 pm    Dispense:  60 tablet    Refill:  3    06/23/2022 NEW-stop torsemide   potassium chloride SA (KLOR-CON M) 20 MEQ tablet    Sig: Take 1 tablet (20 mEq total) by mouth daily.    Dispense:  30 tablet    Refill:  3    06/23/2022 NEW    Disposition:  Follow up  5 days with Finis Bud, NP and 3 months with me  Signed Mahlia Fernando Fidel Levy, MD, 06/25/2022 2:18 PM    [facility] [address], [place], [place] [address]

## 2022-06-27 ENCOUNTER — Other Ambulatory Visit: Payer: Self-pay

## 2022-06-27 ENCOUNTER — Emergency Department (HOSPITAL_COMMUNITY): Payer: 59

## 2022-06-27 ENCOUNTER — Ambulatory Visit: Payer: 59 | Attending: Nurse Practitioner | Admitting: Nurse Practitioner

## 2022-06-27 ENCOUNTER — Encounter: Payer: Self-pay | Admitting: Nurse Practitioner

## 2022-06-27 ENCOUNTER — Inpatient Hospital Stay (HOSPITAL_COMMUNITY)
Admission: EM | Admit: 2022-06-27 | Discharge: 2022-06-30 | DRG: 321 | Disposition: A | Discharge status: Home / Self Care | Payer: 59 | Attending: Family Medicine | Admitting: Family Medicine

## 2022-06-27 ENCOUNTER — Encounter (HOSPITAL_COMMUNITY): Payer: Self-pay

## 2022-06-27 VITALS — BP 132/76 | HR 90 | Ht 66.0 in | Wt 240.0 lb

## 2022-06-27 DIAGNOSIS — E669 Obesity, unspecified: Secondary | ICD-10-CM | POA: Diagnosis present

## 2022-06-27 DIAGNOSIS — E7800 Pure hypercholesterolemia, unspecified: Secondary | ICD-10-CM | POA: Diagnosis present

## 2022-06-27 DIAGNOSIS — I272 Pulmonary hypertension, unspecified: Secondary | ICD-10-CM | POA: Diagnosis present

## 2022-06-27 DIAGNOSIS — Z7982 Long term (current) use of aspirin: Secondary | ICD-10-CM

## 2022-06-27 DIAGNOSIS — J9601 Acute respiratory failure with hypoxia: Secondary | ICD-10-CM | POA: Diagnosis present

## 2022-06-27 DIAGNOSIS — I25110 Atherosclerotic heart disease of native coronary artery with unstable angina pectoris: Secondary | ICD-10-CM | POA: Diagnosis present

## 2022-06-27 DIAGNOSIS — Z1152 Encounter for screening for COVID-19: Secondary | ICD-10-CM

## 2022-06-27 DIAGNOSIS — E785 Hyperlipidemia, unspecified: Secondary | ICD-10-CM | POA: Diagnosis present

## 2022-06-27 DIAGNOSIS — Z6838 Body mass index (BMI) 38.0-38.9, adult: Secondary | ICD-10-CM

## 2022-06-27 DIAGNOSIS — Z955 Presence of coronary angioplasty implant and graft: Secondary | ICD-10-CM | POA: Diagnosis not present

## 2022-06-27 DIAGNOSIS — I5031 Acute diastolic (congestive) heart failure: Secondary | ICD-10-CM | POA: Diagnosis not present

## 2022-06-27 DIAGNOSIS — K219 Gastro-esophageal reflux disease without esophagitis: Secondary | ICD-10-CM | POA: Diagnosis present

## 2022-06-27 DIAGNOSIS — I5033 Acute on chronic diastolic (congestive) heart failure: Secondary | ICD-10-CM | POA: Diagnosis present

## 2022-06-27 DIAGNOSIS — I10 Essential (primary) hypertension: Secondary | ICD-10-CM

## 2022-06-27 DIAGNOSIS — E11649 Type 2 diabetes mellitus with hypoglycemia without coma: Secondary | ICD-10-CM | POA: Diagnosis present

## 2022-06-27 DIAGNOSIS — Z85038 Personal history of other malignant neoplasm of large intestine: Secondary | ICD-10-CM | POA: Diagnosis present

## 2022-06-27 DIAGNOSIS — F209 Schizophrenia, unspecified: Secondary | ICD-10-CM | POA: Diagnosis present

## 2022-06-27 DIAGNOSIS — Z87891 Personal history of nicotine dependence: Secondary | ICD-10-CM | POA: Diagnosis not present

## 2022-06-27 DIAGNOSIS — I130 Hypertensive heart and chronic kidney disease with heart failure and stage 1 through stage 4 chronic kidney disease, or unspecified chronic kidney disease: Secondary | ICD-10-CM | POA: Diagnosis present

## 2022-06-27 DIAGNOSIS — I200 Unstable angina: Secondary | ICD-10-CM | POA: Diagnosis not present

## 2022-06-27 DIAGNOSIS — Z825 Family history of asthma and other chronic lower respiratory diseases: Secondary | ICD-10-CM | POA: Diagnosis not present

## 2022-06-27 DIAGNOSIS — N182 Chronic kidney disease, stage 2 (mild): Secondary | ICD-10-CM | POA: Diagnosis present

## 2022-06-27 DIAGNOSIS — Z8249 Family history of ischemic heart disease and other diseases of the circulatory system: Secondary | ICD-10-CM

## 2022-06-27 DIAGNOSIS — Z7984 Long term (current) use of oral hypoglycemic drugs: Secondary | ICD-10-CM

## 2022-06-27 DIAGNOSIS — Z79899 Other long term (current) drug therapy: Secondary | ICD-10-CM | POA: Diagnosis not present

## 2022-06-27 DIAGNOSIS — F333 Major depressive disorder, recurrent, severe with psychotic symptoms: Secondary | ICD-10-CM | POA: Diagnosis present

## 2022-06-27 DIAGNOSIS — Z800 Family history of malignant neoplasm of digestive organs: Secondary | ICD-10-CM

## 2022-06-27 DIAGNOSIS — Z823 Family history of stroke: Secondary | ICD-10-CM

## 2022-06-27 DIAGNOSIS — Z813 Family history of other psychoactive substance abuse and dependence: Secondary | ICD-10-CM

## 2022-06-27 DIAGNOSIS — Z830 Family history of human immunodeficiency virus [HIV] disease: Secondary | ICD-10-CM

## 2022-06-27 DIAGNOSIS — I509 Heart failure, unspecified: Secondary | ICD-10-CM

## 2022-06-27 DIAGNOSIS — I700 Atherosclerosis of aorta: Secondary | ICD-10-CM | POA: Diagnosis present

## 2022-06-27 DIAGNOSIS — Z7902 Long term (current) use of antithrombotics/antiplatelets: Secondary | ICD-10-CM | POA: Diagnosis not present

## 2022-06-27 DIAGNOSIS — I808 Phlebitis and thrombophlebitis of other sites: Secondary | ICD-10-CM | POA: Diagnosis not present

## 2022-06-27 DIAGNOSIS — N179 Acute kidney failure, unspecified: Secondary | ICD-10-CM | POA: Diagnosis not present

## 2022-06-27 DIAGNOSIS — I2510 Atherosclerotic heart disease of native coronary artery without angina pectoris: Secondary | ICD-10-CM

## 2022-06-27 DIAGNOSIS — E119 Type 2 diabetes mellitus without complications: Secondary | ICD-10-CM | POA: Diagnosis present

## 2022-06-27 DIAGNOSIS — F411 Generalized anxiety disorder: Secondary | ICD-10-CM | POA: Diagnosis present

## 2022-06-27 DIAGNOSIS — F319 Bipolar disorder, unspecified: Secondary | ICD-10-CM | POA: Diagnosis present

## 2022-06-27 DIAGNOSIS — M1990 Unspecified osteoarthritis, unspecified site: Secondary | ICD-10-CM | POA: Diagnosis present

## 2022-06-27 DIAGNOSIS — I5030 Unspecified diastolic (congestive) heart failure: Secondary | ICD-10-CM | POA: Diagnosis present

## 2022-06-27 DIAGNOSIS — Z8261 Family history of arthritis: Secondary | ICD-10-CM

## 2022-06-27 DIAGNOSIS — I5032 Chronic diastolic (congestive) heart failure: Secondary | ICD-10-CM | POA: Diagnosis not present

## 2022-06-27 DIAGNOSIS — R0602 Shortness of breath: Secondary | ICD-10-CM | POA: Diagnosis present

## 2022-06-27 DIAGNOSIS — I25118 Atherosclerotic heart disease of native coronary artery with other forms of angina pectoris: Secondary | ICD-10-CM | POA: Diagnosis not present

## 2022-06-27 LAB — TROPONIN I (HIGH SENSITIVITY)
Troponin I (High Sensitivity): 3 ng/L (ref <18)
Troponin I (High Sensitivity): 3 ng/L (ref <18)

## 2022-06-27 LAB — MAGNESIUM: Magnesium: 1.9 mg/dL (ref 1.7–2.4)

## 2022-06-27 LAB — URINALYSIS, ROUTINE W REFLEX MICROSCOPIC
Bilirubin Urine: NEGATIVE
Glucose, UA: NEGATIVE mg/dL
Hgb urine dipstick: NEGATIVE
Ketones, ur: NEGATIVE mg/dL
Nitrite: NEGATIVE
Protein, ur: NEGATIVE mg/dL
Specific Gravity, Urine: 1.004 — ABNORMAL LOW (ref 1.005–1.030)
pH: 7 (ref 5.0–8.0)

## 2022-06-27 LAB — BASIC METABOLIC PANEL
Anion gap: 8 (ref 5–15)
BUN: 16 mg/dL (ref 8–23)
CO2: 29 mmol/L (ref 22–32)
Calcium: 9.5 mg/dL (ref 8.9–10.3)
Chloride: 102 mmol/L (ref 98–111)
Creatinine, Ser: 0.94 mg/dL (ref 0.44–1.00)
GFR, Estimated: >60 mL/min (ref >60)
Glucose, Bld: 69 mg/dL — ABNORMAL LOW (ref 70–99)
Potassium: 3.6 mmol/L (ref 3.5–5.1)
Sodium: 139 mmol/L (ref 135–145)

## 2022-06-27 LAB — RESP PANEL BY RT-PCR (RSV, FLU A&B, COVID)  RVPGX2
Influenza A by PCR: NEGATIVE
Influenza B by PCR: NEGATIVE
Resp Syncytial Virus by PCR: NEGATIVE
SARS Coronavirus 2 by RT PCR: NEGATIVE

## 2022-06-27 LAB — CBC
HCT: 40.3 % (ref 36.0–46.0)
Hemoglobin: 13.3 g/dL (ref 12.0–15.0)
MCH: 29.8 pg (ref 26.0–34.0)
MCHC: 33 g/dL (ref 30.0–36.0)
MCV: 90.4 fL (ref 80.0–100.0)
Platelets: 198 10*3/uL (ref 150–400)
RBC: 4.46 MIL/uL (ref 3.87–5.11)
RDW: 14.2 % (ref 11.5–15.5)
WBC: 6.5 10*3/uL (ref 4.0–10.5)
nRBC: 0 % (ref 0.0–0.2)

## 2022-06-27 LAB — BRAIN NATRIURETIC PEPTIDE: B Natriuretic Peptide: 5 pg/mL (ref 0.0–100.0)

## 2022-06-27 LAB — PHOSPHORUS: Phosphorus: 2.7 mg/dL (ref 2.5–4.6)

## 2022-06-27 LAB — D-DIMER, QUANTITATIVE: D-Dimer, Quant: <0.27 ug/mL-FEU (ref 0.00–0.50)

## 2022-06-27 LAB — PROTIME-INR
INR: 0.9 (ref 0.8–1.2)
Prothrombin Time: 12 seconds (ref 11.4–15.2)

## 2022-06-27 LAB — HIV ANTIBODY (ROUTINE TESTING W REFLEX): HIV Screen 4th Generation wRfx: NONREACTIVE

## 2022-06-27 MED ORDER — BISACODYL 5 MG PO TBEC: 5.0000 mg | DELAYED_RELEASE_TABLET | Freq: Every day | ORAL | Status: DC | PRN

## 2022-06-27 MED ORDER — HYDRALAZINE HCL 20 MG/ML IJ SOLN: 10.0000 mg | INTRAMUSCULAR | Status: DC | PRN

## 2022-06-27 MED ORDER — PANTOPRAZOLE SODIUM 40 MG PO TBEC
40.0000 mg | DELAYED_RELEASE_TABLET | Freq: Every day | ORAL | Status: DC
  Administered 2022-06-28 – 2022-06-30 (×3): 40 mg via ORAL
  Filled 2022-06-27 (×3): qty 1

## 2022-06-27 MED ORDER — SODIUM CHLORIDE 0.9% FLUSH
3.0000 mL | Freq: Two times a day (BID) | INTRAVENOUS | Status: DC
  Administered 2022-06-28 (×2): 3 mL via INTRAVENOUS

## 2022-06-27 MED ORDER — TETRABENAZINE 12.5 MG PO TABS
12.5000 mg | ORAL_TABLET | Freq: Two times a day (BID) | ORAL | Status: DC
  Administered 2022-06-29 – 2022-06-30 (×2): 12.5 mg via ORAL
  Filled 2022-06-27 (×3): qty 1

## 2022-06-27 MED ORDER — SODIUM CHLORIDE 0.9% FLUSH
3.0000 mL | Freq: Two times a day (BID) | INTRAVENOUS | Status: DC
  Administered 2022-06-27 – 2022-06-30 (×4): 3 mL via INTRAVENOUS

## 2022-06-27 MED ORDER — ONDANSETRON HCL 4 MG PO TABS: 4.0000 mg | ORAL_TABLET | Freq: Four times a day (QID) | ORAL | Status: DC | PRN

## 2022-06-27 MED ORDER — SODIUM CHLORIDE 0.9% FLUSH
3.0000 mL | Freq: Two times a day (BID) | INTRAVENOUS | Status: DC
  Administered 2022-06-27 – 2022-06-28 (×3): 3 mL via INTRAVENOUS

## 2022-06-27 MED ORDER — LEVALBUTEROL HCL 0.63 MG/3ML IN NEBU: 0.6300 mg | INHALATION_SOLUTION | Freq: Four times a day (QID) | RESPIRATORY_TRACT | Status: DC | PRN

## 2022-06-27 MED ORDER — TRAZODONE HCL 50 MG PO TABS: 25.0000 mg | ORAL_TABLET | Freq: Every evening | ORAL | Status: DC | PRN

## 2022-06-27 MED ORDER — SODIUM CHLORIDE 0.9 % IV SOLN: 250.0000 mL | INTRAVENOUS | Status: DC | PRN

## 2022-06-27 MED ORDER — ACETAMINOPHEN 325 MG PO TABS
650.0000 mg | ORAL_TABLET | Freq: Four times a day (QID) | ORAL | Status: DC | PRN
  Administered 2022-06-28: 650 mg via ORAL
  Filled 2022-06-27 (×2): qty 2

## 2022-06-27 MED ORDER — SODIUM CHLORIDE 0.9 % IV SOLN: INTRAVENOUS | Status: DC

## 2022-06-27 MED ORDER — ROSUVASTATIN CALCIUM 10 MG PO TABS: 10.0000 mg | ORAL_TABLET | Freq: Every day | ORAL | Status: DC

## 2022-06-27 MED ORDER — LURASIDONE HCL 20 MG PO TABS: 20.0000 mg | ORAL_TABLET | Freq: Every day | ORAL | Status: DC

## 2022-06-27 MED ORDER — FLEET ENEMA 7-19 GM/118ML RE ENEM: 1.0000 | ENEMA | Freq: Once | RECTAL | Status: DC | PRN

## 2022-06-27 MED ORDER — UMECLIDINIUM BROMIDE 62.5 MCG/ACT IN AEPB
1.0000 | INHALATION_SPRAY | Freq: Every day | RESPIRATORY_TRACT | Status: DC
  Administered 2022-06-28 – 2022-06-29 (×2): 1 via RESPIRATORY_TRACT
  Filled 2022-06-27 (×2): qty 7

## 2022-06-27 MED ORDER — HEPARIN SODIUM (PORCINE) 5000 UNIT/ML IJ SOLN
5000.0000 [IU] | Freq: Three times a day (TID) | INTRAMUSCULAR | Status: DC
  Administered 2022-06-27 – 2022-06-30 (×8): 5000 [IU] via SUBCUTANEOUS
  Filled 2022-06-27 (×7): qty 1

## 2022-06-27 MED ORDER — FUROSEMIDE 10 MG/ML IJ SOLN
80.0000 mg | Freq: Once | INTRAMUSCULAR | Status: AC
  Administered 2022-06-27: 80 mg via INTRAVENOUS
  Filled 2022-06-27: qty 8

## 2022-06-27 MED ORDER — TIOTROPIUM BROMIDE MONOHYDRATE 1.25 MCG/ACT IN AERS: 1.0000 | INHALATION_SPRAY | Freq: Every day | RESPIRATORY_TRACT | Status: DC

## 2022-06-27 MED ORDER — HYDROMORPHONE HCL 1 MG/ML IJ SOLN: 0.5000 mg | INTRAMUSCULAR | Status: DC | PRN

## 2022-06-27 MED ORDER — VITAMIN B-12 1000 MCG PO TABS
1000.0000 ug | ORAL_TABLET | Freq: Every day | ORAL | Status: DC
  Administered 2022-06-28 – 2022-06-30 (×3): 1000 ug via ORAL
  Filled 2022-06-27 (×3): qty 1

## 2022-06-27 MED ORDER — OXYCODONE HCL 5 MG PO TABS
5.0000 mg | ORAL_TABLET | ORAL | Status: DC | PRN
  Administered 2022-06-27: 5 mg via ORAL
  Filled 2022-06-27: qty 1

## 2022-06-27 MED ORDER — ONDANSETRON HCL 4 MG/2ML IJ SOLN
4.0000 mg | Freq: Four times a day (QID) | INTRAMUSCULAR | Status: DC | PRN
  Administered 2022-06-28: 4 mg via INTRAVENOUS
  Filled 2022-06-27: qty 2

## 2022-06-27 MED ORDER — SENNOSIDES-DOCUSATE SODIUM 8.6-50 MG PO TABS
1.0000 | ORAL_TABLET | Freq: Every evening | ORAL | Status: DC | PRN
  Administered 2022-06-29: 1 via ORAL
  Filled 2022-06-27: qty 1

## 2022-06-27 MED ORDER — IPRATROPIUM BROMIDE 0.02 % IN SOLN: 0.5000 mg | Freq: Four times a day (QID) | RESPIRATORY_TRACT | Status: DC | PRN

## 2022-06-27 MED ORDER — ACETAMINOPHEN 650 MG RE SUPP: 650.0000 mg | Freq: Four times a day (QID) | RECTAL | Status: DC | PRN

## 2022-06-27 MED ORDER — AMLODIPINE BESYLATE 5 MG PO TABS: 5.0000 mg | ORAL_TABLET | Freq: Every day | ORAL | Status: DC

## 2022-06-27 MED ORDER — ADULT MULTIVITAMIN W/MINERALS CH
1.0000 | ORAL_TABLET | Freq: Every day | ORAL | Status: DC
  Administered 2022-06-28 – 2022-06-30 (×3): 1 via ORAL
  Filled 2022-06-27 (×3): qty 1

## 2022-06-27 MED ORDER — TRAZODONE HCL 100 MG PO TABS
300.0000 mg | ORAL_TABLET | Freq: Every day | ORAL | Status: DC
  Administered 2022-06-27 – 2022-06-29 (×3): 300 mg via ORAL
  Filled 2022-06-27: qty 3
  Filled 2022-06-27: qty 6
  Filled 2022-06-27: qty 3

## 2022-06-27 MED ORDER — FUROSEMIDE 10 MG/ML IJ SOLN
40.0000 mg | Freq: Two times a day (BID) | INTRAMUSCULAR | Status: DC
  Administered 2022-06-27: 40 mg via INTRAVENOUS
  Filled 2022-06-27: qty 4

## 2022-06-27 MED ORDER — ASPIRIN 81 MG PO TBEC
81.0000 mg | DELAYED_RELEASE_TABLET | Freq: Every day | ORAL | Status: DC
  Administered 2022-06-28 – 2022-06-29 (×2): 81 mg via ORAL
  Filled 2022-06-27 (×2): qty 1

## 2022-06-27 MED ORDER — POTASSIUM CHLORIDE CRYS ER 20 MEQ PO TBCR
40.0000 meq | EXTENDED_RELEASE_TABLET | ORAL | Status: AC
  Administered 2022-06-27 (×2): 40 meq via ORAL
  Filled 2022-06-27 (×2): qty 2

## 2022-06-27 MED ORDER — POTASSIUM CHLORIDE CRYS ER 20 MEQ PO TBCR: 40.0000 meq | EXTENDED_RELEASE_TABLET | Freq: Once | ORAL | Status: DC

## 2022-06-27 NOTE — Progress Notes (Addendum)
Cardiology Office Note:    Date:  06/27/2022   ID:  Ashley Patrick, DOB Oct 26, 1955, MRN [id number]  PCP:  Denyce Robert, [place] [facility] Cardiologist:  Chalmers Guest, MD     Referring MD: Denyce Robert, FNP   CC: Here for HFpEF follow-up  History of Present Illness:    Ashley Patrick is a 67 y.o. female with a hx of the following:  HFpEF Hypertension Hyperlipidemia Former tobacco abuse Panic attacks  Patient is a 67 year old female with past medical history as mentioned above.  Last seen by Dr. Dellia Cloud on June 23, 2022 for follow-up of HFpEF.  She continued to have DOE and new bilateral leg swelling despite increasing torsemide to 10 mg twice daily.  Becomes short of breath with walking a few steps.  Denied any chest pain, palpitations, syncope, lightheadedness or dizziness.  She was planning on undergoing surgery (infraumbilical panniculectomy) and her PCP was requesting cardiology clearance.  Patient switched from smoking to vaping.  Torsemide was stopped and was switched to Lasix 80 mg twice daily, started on potassium 20 mill, once daily.  Dr. Dellia Cloud stated she needs to be volume optimized before undergoing invasive procedures.  Was told to follow-up in 5 days with me in 3 months with Dr. Vonzella Nipple.  Today she presents for 5-day follow-up.  She states her symptoms are about the same. Still short of breath at rest and with minimal exertion. Compliant with her medications. Weight is stable. Denies any chest pain, palpitations, syncope, presycnope, dizziness, or claudication. Leg edema has improved but CC today is shortness of breath. Denies any other questions or concerns.    Past Medical History:  Diagnosis Date   Anxiety    Aortic atherosclerosis (HCC)    Arthritis    Asthma    Back pain    Bipolar disorder ([place])    Cancer ([place]) 2014   Colon Cancer   Constipation    Depression    Dyspnea    Hypercholesteremia    Hypertension     Insomnia    Lateral epicondylitis  of elbow    right   Migraine headache    Nicotine addiction    Obesity    History of   OD (overdose of drug)    hospitalized for 11 days    Panic attacks    Psychosis ([place])    Schizophrenia ([place])     Past Surgical History:  Procedure Laterality Date   ABDOMINAL HYSTERECTOMY     BIOPSY N/A 08/13/2014   Procedure: BIOPSY;  Surgeon: Daneil Dolin, MD;  Location: [facility];  Service: Endoscopy;  Laterality: N/A;   BREAST BIOPSY  10/18/2011   Procedure: BREAST BIOPSY;  Surgeon: Jamesetta So, MD;  Location: [facility];  Service: General;  Laterality: Left;   COLON RESECTION N/A 06/23/2013   Procedure: HAND ASSISTED LAPAROSCOPIC PARTIAL COLECTOMY;  Surgeon: Jamesetta So, MD;  Location: [facility];  Service: General;  Laterality: N/A;   COLONOSCOPY     COLONOSCOPY N/A 05/29/2013   EUM:PNTIRWE mass most likely representing colorectal cancer S/ P biospy.Multiple colonic and rectal polyps removed/treated as described above. Colonic diverticulosis   COLONOSCOPY WITH PROPOFOL N/A 08/13/2014   RMR: Status post sigmoid colectomy. Multiple colonic polyps removed as described above. Redundant colon. Pan colonic diverticuloisi   COLONOSCOPY WITH PROPOFOL N/A 05/22/2016   Surgeon: Daneil Dolin, MD; 2 polyps removed, but did not survive pathology processing.  Surgical anastomosis in the sigmoid colon  noted, appeared patent, question somewhat narrowed just proximal anastomosis.  Repeat in 3 years.   COLONOSCOPY WITH PROPOFOL N/A 08/05/2021   Procedure: COLONOSCOPY WITH PROPOFOL;  Surgeon: Eloise Harman, DO;  [facility]: [facility];  Service: Endoscopy;  Laterality: N/A;  10:00am   ESOPHAGEAL DILATION N/A 08/13/2014   Procedure: [place];  Surgeon: Daneil Dolin, MD;  Location: [facility];  Service: Endoscopy;  Laterality: N/A;   ESOPHAGOGASTRODUODENOSCOPY N/A 12/26/2012   DPO:EUMPNTI reflux esophagitis. Gastric and duodenal bulbar  erosions-status post gastric biopsynegative H.pylori   ESOPHAGOGASTRODUODENOSCOPY (EGD) WITH PROPOFOL N/A 08/13/2014   RMR: Small benign cystic-appearing lesion in distal esophagus of doubtful clinical significance, otherwise normal esophagus, status post Maloney dilation. Small hiatal hernia, some retained gastric contents (query delayed gastric emptying.)   partial hysterectomy     POLYPECTOMY N/A 08/13/2014   Procedure: POLYPECTOMY;  Surgeon: Daneil Dolin, MD;  Location: [facility];  Service: Endoscopy;  Laterality: N/A;   POLYPECTOMY  08/05/2021   Procedure: POLYPECTOMY;  Surgeon: Eloise Harman, DO;  [facility];  Service: Endoscopy;;    Current Medications: Current Meds  Medication Sig   acetaminophen (TYLENOL) 650 MG CR tablet Take 650 mg by mouth every 8 (eight) hours as needed for pain.   albuterol (VENTOLIN HFA) 108 (90 Base) MCG/ACT inhaler Inhale 2 puffs into the lungs every 4 (four) hours as needed for wheezing or shortness of breath.   ALLERGY RELIEF 10 MG tablet Take 10 mg by mouth daily.   amLODipine (NORVASC) 5 MG tablet Take 5 mg by mouth daily.   aspirin EC 81 MG tablet Take 81 mg by mouth daily. Swallow whole.   [START ON 08/10/2022] cariprazine (VRAYLAR) 1.5 MG capsule Take 1 capsule (1.5 mg total) by mouth daily.   celecoxib (CELEBREX) 100 MG capsule Take 100 mg by mouth 2 (two) times daily as needed.   furosemide (LASIX) 80 MG tablet Take 1 tablet (80 mg total) by mouth in the morning. & 80 mg at 5:00 pm   [START ON 07/13/2022] metFORMIN (GLUCOPHAGE) 500 MG tablet Take 1 tablet (500 mg total) by mouth every evening.   Multiple Vitamins-Minerals (MULTIVITAMIN WITH MINERALS) tablet Take 1 tablet by mouth daily.   olmesartan (BENICAR) 5 MG tablet Take 2 tablets (10 mg total) by mouth daily. (Patient taking differently: Take 5 mg by mouth daily.)   omeprazole (PRILOSEC) 20 MG capsule Take 20 mg by mouth daily.   potassium chloride SA (KLOR-CON M) 20 MEQ tablet  Take 1 tablet (20 mEq total) by mouth daily.   rosuvastatin (CRESTOR) 10 MG tablet Take 1 tablet (10 mg total) by mouth daily.   SPIRIVA RESPIMAT 1.25 MCG/ACT AERS Inhale 1 puff into the lungs daily.   tetrabenazine (XENAZINE) 12.5 MG tablet Take 1 tablet (12.5 mg total) by mouth 2 (two) times daily.   traZODone (DESYREL) 150 MG tablet Take 300 mg by mouth at bedtime.   vitamin B-12 (CYANOCOBALAMIN) 1000 MCG tablet Take 1,000 mcg by mouth daily.   zaleplon (SONATA) 5 MG capsule Take 1 capsule (5 mg total) by mouth at bedtime as needed for sleep.     Allergies:   Patient has no known allergies.   Social History   Socioeconomic History   Marital status: Legally Separated    Spouse name: Not on file   Number of children: 3   Years of education: Not on file   Highest education level: Not on file  Occupational History  Not on file  Tobacco Use   Smoking status: Former    Packs/day: 0.50    Years: 38.00    Total pack years: 19.00    Types: Cigarettes    Quit date: 06/19/2022    Years since quitting: 0.0   Smokeless tobacco: Never   Tobacco comments:    States she has not smoked this week and is attempting to quit.   Vaping Use   Vaping Use: Never used  Substance and Sexual Activity   Alcohol use: No    Alcohol/week: 0.0 standard drinks of alcohol   Drug use: No   Sexual activity: Not Currently    Birth control/protection: Surgical    Comment: hyst  Other Topics Concern   Not on file  Social History Narrative   Not on file   Social Determinants of Health   Financial Resource Strain: Low Risk  (06/01/2021)   Overall Financial Resource Strain (CARDIA)    Difficulty of Paying Living Expenses: Not very hard  Food Insecurity: No Food Insecurity (06/01/2021)   Hunger Vital Sign    Worried About Running Out of Food in the Last Year: Never true    Ran Out of Food in the Last Year: Never true  Transportation Needs: No Transportation Needs (06/01/2021)   PRAPARE - Armed forces logistics/support/administrative officer (Medical): No    Lack of Transportation (Non-Medical): No  Physical Activity: Insufficiently Active (06/01/2021)   Exercise Vital Sign    Days of Exercise per Week: 1 day    Minutes of Exercise per Session: 10 min  Stress: No Stress Concern Present (06/01/2021)   [place]    Feeling of Stress : Only a little  Social Connections: Moderately Isolated (06/01/2021)   Social Connection and Isolation Panel [NHANES]    Frequency of Communication with Friends and Family: Twice a week    Frequency of Social Gatherings with Friends and Family: Once a week    Attends Religious Services: 1 to 4 times per year    Active Member of [employer] or Organizations: No    Attends Music therapist: Never    Marital Status: Separated     Family History: The patient's family history includes Arthritis in her father and mother; Asthma in her sister; Colon cancer in her father and paternal uncle; Drug abuse in her father and mother; HIV/AIDS in her brother; Hypertension in her sister; Stroke in her mother. There is no history of Anesthesia problems, Hypotension, Malignant hyperthermia, or Pseudochol deficiency.  ROS:   Review of Systems  Constitutional: Negative.   HENT: Negative.    Eyes: Negative.   Respiratory:  Positive for shortness of breath and wheezing. Negative for cough, hemoptysis and sputum production.        Pt says sometimes she can hear herself wheezing at night. See HPI.   Cardiovascular:  Positive for leg swelling. Negative for chest pain, palpitations, orthopnea, claudication and PND.       Improved. See HPI.   Gastrointestinal: Negative.   Genitourinary: Negative.   Musculoskeletal: Negative.   Skin: Negative.   Neurological: Negative.   Endo/Heme/Allergies: Negative.   Psychiatric/Behavioral:  Negative for depression, hallucinations, memory loss, substance abuse and suicidal ideas.  The patient is nervous/anxious. The patient does not have insomnia.        Hx of panic attacks.     Please see the history of present illness.    All other systems reviewed  and are negative.  EKGs/Labs/Other Studies Reviewed:    The following studies were reviewed today:   EKG:  EKG is not ordered today.    Venous ultrasound right lower extremity (DVT) on June 23, 2022: No right lower extremity DVT.   Lexiscan on May 15, 2022:   No ST deviation was noted during stress.   LV perfusion is normal. There is no evidence of current ischemia or prior infarction.   Left ventricular function is normal.   The study is normal. The study is low risk.  Echocardiogram on Nov 11, 2021: 1. Left ventricular ejection fraction, by estimation, is 60 to 65%. The  left ventricle has normal function. The left ventricle has no regional  wall motion abnormalities. There is mild left ventricular hypertrophy.  Left ventricular diastolic parameters  are consistent with Grade I diastolic dysfunction (impaired relaxation).   2. Right ventricular systolic function is normal. The right ventricular  size is normal. There is normal pulmonary artery systolic pressure. The  estimated right ventricular systolic pressure is 40.9 mmHg.   3. Left atrial size was moderately dilated.   4. The mitral valve is grossly normal. Trivial mitral valve  regurgitation.   5. The aortic valve is tricuspid. Aortic valve regurgitation is not  visualized.   6. The inferior vena cava is normal in size with greater than 50%  respiratory variability, suggesting right atrial pressure of 3 mmHg.    Recent Labs: 12/14/2021: ALT 18; BUN 16; Creatinine, Ser 0.89; Hemoglobin 12.9; Platelets 183; Potassium 3.3; Sodium 141  Recent Lipid Panel    Component Value Date/Time   CHOL 258 (H) 04/24/2022 1211   TRIG 178 (H) 04/24/2022 1211   HDL 50 04/24/2022 1211   CHOLHDL 5.2 04/24/2022 1211   VLDL 36 04/24/2022 1211   LDLCALC 172  (H) 04/24/2022 1211     Risk Assessment/Calculations:        The 10-year ASCVD risk score (Arnett DK, et al., 2019) is: 12%   Values used to calculate the score:     Age: 14 years     Sex: Female     Is Non-Hispanic African American: Yes     Diabetic: No     Tobacco smoker: No     Systolic Blood Pressure: 811 mmHg     Is BP treated: Yes     HDL Cholesterol: 50 mg/dL     Total Cholesterol: 258 mg/dL     \  Physical Exam:    VS:  BP 132/76   Pulse 90   Ht '5\' 6"'$  (1.676 m)   Wt 240 lb (108.9 kg)   SpO2 96%   BMI 38.74 kg/m     Wt Readings from Last 3 Encounters:  06/27/22 240 lb (108.9 kg)  06/23/22 241 lb 6.4 oz (109.5 kg)  04/18/22 233 lb 9.6 oz (106 kg)     GEN: Obese, 67 y.o. female in acute distress, short of breath at rest HEENT: Normal NECK: No JVD; No carotid bruits CARDIAC: S1/S2, RRR, no murmurs, rubs, gallops; 2+ pulses RESPIRATORY:  Clear/diminished to auscultation without rales, wheezing or rhonchi, tachypnea, short of breath at rest MUSCULOSKELETAL:  No edema; No deformity  SKIN: Warm and dry NEUROLOGIC:  Alert and oriented x 3 PSYCHIATRIC:  Normal affect   ASSESSMENT:    1. Acute on chronic heart failure with preserved ejection fraction ([place])   2. Hypertension, unspecified type    PLAN:    In order of problems listed above:  HFpEF  exacerbation HTN  Patient is a 67 year old female with past medical history of chronic heart failure with preserved ejection fraction, hyperlipidemia, hypertension, panic attacks, and former tobacco user.  She was recently seen at our office 5 days ago for HFpEF follow-up.  Cardiologist stopped torsemide and switch to Lasix 80 mg twice daily, started on potassium supplement.  She was pending upcoming procedure, however cardiologist (Dr. Dellia Cloud) recommended volume optimization prior to surgery.  She was told to follow-up in 5 days at the cardiac office.  Today I see her for follow-up and she is very short of breath at  rest.  She denies any real improvement in her symptoms.  Still short of breath with minimal exertion and at rest.  Says she is also noticed herself wheezing.  On exam she appears in acute distress, tachypneic, short of breath at rest, S1/S2 RRR, 2+ pulses throughout.  No obvious lower extremity edema noted during exam.  Alert and oriented x 4.  Calm and pleasant.  I discussed outpatient management versus inpatient management.  I discussed the risks of outpatient management and recommended inpatient evaluation for what I believe is HFpEF with acute exacerbation.  She agrees with inpatient evaluation and is requesting to be seen in the ED today.  I offered EMS transportation, however she declines and would like her friend who is here with her to take her to [facility] ED. Spoke with ED MD (Dr. Rebeca Allegra) and gave report and he verbalized understanding.   Upon arrival to the ED, I recommend the following be done: 1.  Twelve-lead EKG and vital signs 2.  May need BiPAP as needed for acute shortness of breath 3.  Labs including the following: CBC, thyroid panel, CMET, proBNP, serial trops, D-dimer, lactate, RSV, Covid-19, and flu test 4.   Radiological studies/imaging including the following: 2 view chest x-ray, CT scan of chest to rule out PE or any other acute abnormalities; May be worthwhile to update Echocardiogram. 5.  Recommend admission for close observation.  Will need close monitoring of intake and output.  Daily weights.  Heart healthy, low-salt diet less than 2 g of salt per day.  1500 daily fluid restrictions. 6.  Will most likely need IV Lasix for diuresis. Consult cardiology.  7.  Discharge when in stable condition and breathing has resolved and weight is stable.  I will see her back in our office for hospital follow-up in 1 to 2 weeks post discharge.       Medication Adjustments/Labs and Tests Ordered: Current medicines are reviewed at length with the patient today.  Concerns regarding  medicines are outlined above.  No orders of the defined types were placed in this encounter.  No orders of the defined types were placed in this encounter.   Patient Instructions  Medication Instructions: Your physician recommends that you continue on your current medications as directed. Please refer to the Current Medication list given to you today.   Labwork: None today  Testing/Procedures: None today  Follow-Up: 2 weeks after ER visit  Any Other Special Instructions Will Be Listed Below (If Applicable).   Please go to [facility] ER for evaluation of shortness of breath. E.Giorgia Wahler,NP has called the ER to make them aware you are coming.  If you need a refill on your cardiac medications before your next appointment, please call your pharmacy.    SignedFinis Bud, NP  06/27/2022 10:37 AM    [place]

## 2022-06-27 NOTE — Assessment & Plan Note (Signed)
HFpEF exacerbation - Last echocardiogram Nov 11, 2021-EEG GAF 60-65%, mild LVH, grade 1 diastolic dysfunction, impaired relaxation, moderate left atrial dilatation, normal valves, Lexiscan May 15, 2022 no evidence of ischemic changes or prior infarct, -Patient has been on 80 mg of Lasix twice daily at home Still complaining shortness of breath, minimal diuresis -Getting IV Lasix -Monitoring strict I's and O's - Daily weight -BMP, RedsClip reading

## 2022-06-27 NOTE — Assessment & Plan Note (Addendum)
Continue PPI ?

## 2022-06-27 NOTE — Patient Instructions (Signed)
Medication Instructions: Your physician recommends that you continue on your current medications as directed. Please refer to the Current Medication list given to you today.   Labwork: None today  Testing/Procedures: None today  Follow-Up: 2 weeks after ER visit  Any Other Special Instructions Will Be Listed Below (If Applicable).   Please go to [facility] ER for evaluation of shortness of breath. E.Peck,NP has called the ER to make them aware you are coming.  If you need a refill on your cardiac medications before your next appointment, please call your pharmacy.

## 2022-06-27 NOTE — Discharge Instructions (Signed)
Workup here today as per internal medicine hospitalist does not meet criteria for admission.  Follow-up with your cardiology group in [place] for additional workup of your exertional dyspnea.  No evidence of any significant heart failure or pulmonary embolus.  Return for any new or worse symptoms.

## 2022-06-27 NOTE — Assessment & Plan Note (Signed)
Body mass index is 38.9 kg/m. -Counseled regarding healthy diet, exercise, weight loss program Follow-up with a PCP with a plan

## 2022-06-27 NOTE — ED Provider Triage Note (Signed)
Emergency Medicine Provider Triage Evaluation Note  Ashley Patrick , a 67 y.o. female  was evaluated in triage.  Pt complains of increased shortness of breath with dyspnea on exertion, has been ongoing.  She follow-up with her cardiology office today, was seen by the NP.  They had tried pushing her from torsemide to Lasix milligrams twice daily but symptoms are not improving and she is more short of breath.  She denies chest pain, denies fever or chills.  No nausea or vomiting.  Review of Systems  Positive: Dyspnea on exertion Negative: Fever  Physical Exam  BP (!) 137/94 (BP Location: Right Arm)   Pulse 91   Temp 98 F (36.7 C) (Oral)   Resp 20   Ht '5\' 6"'$  (1.676 m)   Wt 109.3 kg   SpO2 98%   BMI 38.90 kg/m  Gen:   Awake, no distress   Resp:  Normal effort  MSK:   Moves extremities without difficulty  Other:    Medical Decision Making  Medically screening exam initiated at 12:03 PM.  Appropriate orders placed.  Ashley Patrick was informed that the remainder of the evaluation will be completed by another provider, this initial triage assessment does not replace that evaluation, and the importance of remaining in the ED until their evaluation is complete.     Gwenevere Abbot, [place] 06/27/22 1205

## 2022-06-27 NOTE — ED Triage Notes (Signed)
Pt reports she was told she had CHF and was given some fluid pills last week and has some mild relief but it still having SHOB and swelling to hands and feet.

## 2022-06-27 NOTE — Consult Note (Addendum)
Cardiology Consultation   Patient ID: Ashley Patrick MRN: [id number]; DOB: [birth year]  Admit date: 06/27/2022 Date of Consult: 06/27/2022  PCP:  Denyce Robert, [place] [facility] Cardiologist:  Chalmers Guest, MD        Patient Profile:   Ashley Patrick is a 67 y.o. female with a hx of chronic HFpEF, HTN, HLD, longstanding tobacco abuse (recently quit to vaping), anxiety, aortic atherosclerosis, back pain, bipolar disorder, colon CA, depression, migraines, panic attacks, schizophrenia, prior OD who is being seen 06/27/2022 for the evaluation of ?CHF at the request of Dr. Roger Shelter.  History of Present Illness:   Ashley Patrick remotely saw Dr. Johnsie Cancel in 2016 for syncope and had stress echo that was normal. She re-established with Dr. Dellia Cloud in 03/2022 for chest pain and SOB as well as pre-op clearance for panniculectomy. Nuclear stress test was low risk, EF 53%. She also carries a dx of HFpEF. Echo 10/2021 showed EF 60-65%, G1DD, normal RV and PASP, moderate LAE, trivial MR, normal IVC. She then was seen 06/23/22 for continued DOE despite increasing torsemide from '5mg'$  to '10mg'$  BID, felt to have HFpEF. Torsemide was changed to Lasix '80mg'$  BID. RLE venous duplex 06/23/22 negative for DVT. She still had not had her surgery yet and Dr. Dellia Cloud advised she be optimized prior to surgery. The patient recently quit smoking and changed to vaping. She presented back to the [place] office today. Edema had improved significantly but diuresis did not help her shortness of breath with exertion. She is very frustrated at her continued symptoms despite adjustment in medicines. Due to persistent DOE, she was sent to the ED for consideration of IV Lasix.  In ED, labs show BNP 5, troponin negative, CBC wnl, d-dimer wnl, INR wnl, K 3.6, Cr 0.94, Mg 1.9, Covid/flu/RSV neg. CXR NAD. EKG NSR unchanged from prior. VSS, 99% RA, SBP 130s, HR 91. She received a dose of '80mg'$  IV Lasix at 1411 then '40mg'$  at  1526. Internal medicine evaluated and given volume status appearing stable, was recommended for discharge. Cardiology was consulted. She reports improvement in dyspnea at rest but remains SOB with exertion. In retrospect she also reports she has been experiencing chest tightness with exertion the last few months improved with rest. She shows [place] a photo of her legs a few days before she changed to Lasix - legs were much more swollen then.    Past Medical History:  Diagnosis Date   Anxiety    Aortic atherosclerosis (HCC)    Arthritis    Asthma    Back pain    Bipolar disorder ([place])    Cancer ([place]) 2014   Colon Cancer   Constipation    Depression    Dyspnea    Hypercholesteremia    Hypertension    Insomnia    Lateral epicondylitis  of elbow    right   Migraine headache    Nicotine addiction    Obesity    History of   OD (overdose of drug)    hospitalized for 11 days    Panic attacks    Psychosis ([place])    Schizophrenia ([place])     Past Surgical History:  Procedure Laterality Date   ABDOMINAL HYSTERECTOMY     BIOPSY N/A 08/13/2014   Procedure: BIOPSY;  Surgeon: Daneil Dolin, MD;  Location: [facility];  Service: Endoscopy;  Laterality: N/A;   BREAST BIOPSY  10/18/2011   Procedure: BREAST BIOPSY;  Surgeon: Jamesetta So,  MD;  Location: [facility];  Service: General;  Laterality: Left;   COLON RESECTION N/A 06/23/2013   Procedure: HAND ASSISTED LAPAROSCOPIC PARTIAL COLECTOMY;  Surgeon: Jamesetta So, MD;  Location: [facility];  Service: General;  Laterality: N/A;   COLONOSCOPY     COLONOSCOPY N/A 05/29/2013   LAG:[id number] mass most likely representing colorectal cancer S/ P biospy.Multiple colonic and rectal polyps removed/treated as described above. Colonic diverticulosis   COLONOSCOPY WITH PROPOFOL N/A 08/13/2014   RMR: Status post sigmoid colectomy. Multiple colonic polyps removed as described above. Redundant colon. Pan colonic diverticuloisi   COLONOSCOPY WITH PROPOFOL N/A 05/22/2016    Surgeon: Daneil Dolin, MD; 2 polyps removed, but did not survive pathology processing.  Surgical anastomosis in the sigmoid colon noted, appeared patent, question somewhat narrowed just proximal anastomosis.  Repeat in 3 years.   COLONOSCOPY WITH PROPOFOL N/A 08/05/2021   Procedure: COLONOSCOPY WITH PROPOFOL;  Surgeon: Eloise Harman, DO;  Location: [facility];  Service: Endoscopy;  Laterality: N/A;  10:00am   ESOPHAGEAL DILATION N/A 08/13/2014   Procedure: [place];  Surgeon: Daneil Dolin, MD;  Location: [facility];  Service: Endoscopy;  Laterality: N/A;   ESOPHAGOGASTRODUODENOSCOPY N/A 12/26/2012   HOZ:YYQMGNO reflux esophagitis. Gastric and duodenal bulbar erosions-status post gastric biopsynegative H.pylori   ESOPHAGOGASTRODUODENOSCOPY (EGD) WITH PROPOFOL N/A 08/13/2014   RMR: Small benign cystic-appearing lesion in distal esophagus of doubtful clinical significance, otherwise normal esophagus, status post Maloney dilation. Small hiatal hernia, some retained gastric contents (query delayed gastric emptying.)   partial hysterectomy     POLYPECTOMY N/A 08/13/2014   Procedure: POLYPECTOMY;  Surgeon: Daneil Dolin, MD;  Location: [facility];  Service: Endoscopy;  Laterality: N/A;   POLYPECTOMY  08/05/2021   Procedure: POLYPECTOMY;  Surgeon: Eloise Harman, DO;  [facility]: [facility];  Service: Endoscopy;;     Home Medications:  Prior to Admission medications   Medication Sig Start Date End Date Taking? Authorizing Provider  acetaminophen (TYLENOL) 650 MG CR tablet Take 650 mg by mouth every 8 (eight) hours as needed for pain.   Yes [provider]  albuterol (VENTOLIN HFA) 108 (90 Base) MCG/ACT inhaler Inhale 2 puffs into the lungs every 4 (four) hours as needed for wheezing or shortness of breath. 07/21/20  Yes [provider]  ALLERGY RELIEF 10 MG tablet Take 10 mg by mouth daily. 05/23/21  Yes [provider]  amLODipine (NORVASC)  5 MG tablet Take 5 mg by mouth daily.   Yes [provider]  aspirin EC 81 MG tablet Take 81 mg by mouth daily. Swallow whole.   Yes [provider]  celecoxib (CELEBREX) 100 MG capsule Take 100 mg by mouth 2 (two) times daily as needed for moderate pain.   Yes [provider]  fluorometholone (FML) 0.1 % ophthalmic suspension Place 2 drops into both eyes 2 (two) times daily. 02/13/22  Yes [provider]  fluticasone (FLONASE) 50 MCG/ACT nasal spray Place 2 sprays into both nostrils daily. 03/28/22  Yes [provider]  furosemide (LASIX) 80 MG tablet Take 1 tablet (80 mg total) by mouth in the morning. & 80 mg at 5:00 pm 06/23/22  Yes Mallipeddi, Vishnu P, MD  metFORMIN (GLUCOPHAGE) 500 MG tablet Take 1 tablet (500 mg total) by mouth every evening. Patient taking differently: Take 500 mg by mouth daily with breakfast. 07/13/22 10/11/22 Yes Hisada, Elie Goody, MD  Multiple Vitamins-Minerals (MULTIVITAMIN WITH MINERALS) tablet Take 1 tablet by  mouth daily.   Yes [provider]  olmesartan (BENICAR) 5 MG tablet Take 2 tablets (10 mg total) by mouth daily. Patient taking differently: Take 5 mg by mouth daily. 12/14/21 04/19/23 Yes Noemi Chapel, MD  omeprazole (PRILOSEC) 20 MG capsule Take 20 mg by mouth daily. 08/25/20  Yes [provider]  potassium chloride SA (KLOR-CON M) 20 MEQ tablet Take 1 tablet (20 mEq total) by mouth daily. 06/23/22  Yes Mallipeddi, Vishnu P, MD  rosuvastatin (CRESTOR) 10 MG tablet Take 1 tablet (10 mg total) by mouth daily. 04/25/22  Yes Mallipeddi, Vishnu P, MD  SPIRIVA RESPIMAT 1.25 MCG/ACT AERS Inhale 2 puffs into the lungs daily. 10/18/20  Yes [provider]  tetrabenazine (XENAZINE) 12.5 MG tablet Take 1 tablet (12.5 mg total) by mouth 2 (two) times daily. 04/18/22 07/17/22 Yes Hisada, Elie Goody, MD  vitamin B-12 (CYANOCOBALAMIN) 1000 MCG tablet Take 1,000 mcg by mouth daily.   Yes [provider]  zaleplon  (SONATA) 5 MG capsule Take 1 capsule (5 mg total) by mouth at bedtime as needed for sleep. 06/22/22 09/20/22 Yes Hisada, Elie Goody, MD  cariprazine (VRAYLAR) 1.5 MG capsule Take 1 capsule (1.5 mg total) by mouth daily. 08/10/22 11/08/22  [name], MD  LATUDA 20 MG TABS tablet Take 20 mg by mouth daily. Patient not taking: Reported on 06/27/2022 03/20/22   [provider]  traZODone (DESYREL) 150 MG tablet Take 300 mg by mouth at bedtime. Patient not taking: Reported on 06/27/2022    [provider]    Inpatient Medications: Scheduled Meds:  [START ON 06/28/2022] amLODipine  5 mg Oral Daily   [START ON 06/28/2022] aspirin EC  81 mg Oral Daily   [START ON 06/28/2022] cyanocobalamin  1,000 mcg Oral Daily   furosemide  40 mg Intravenous BID   heparin  5,000 Units Subcutaneous Q8H   [START ON 06/28/2022] multivitamin with minerals  1 tablet Oral Daily   [START ON 06/28/2022] pantoprazole  40 mg Oral Daily   [START ON 06/28/2022] rosuvastatin  10 mg Oral Daily   sodium chloride flush  3 mL Intravenous Q12H   sodium chloride flush  3 mL Intravenous Q12H   tetrabenazine  12.5 mg Oral BID   traZODone  300 mg Oral QHS   [START ON 06/28/2022] umeclidinium bromide  1 puff Inhalation Daily   Continuous Infusions:  sodium chloride     PRN Meds: sodium chloride, acetaminophen **OR** acetaminophen, bisacodyl, hydrALAZINE, HYDROmorphone (DILAUDID) injection, ipratropium, levalbuterol, ondansetron **OR** ondansetron (ZOFRAN) IV, oxyCODONE, senna-docusate, sodium phosphate, traZODone  Allergies:   No Known Allergies  Social History:   Social History   Socioeconomic History   Marital status: Legally Separated    Spouse name: Not on file   Number of children: 3   Years of education: Not on file   Highest education level: Not on file  Occupational History   Not on file  Tobacco Use   Smoking status: Former    Packs/day: 0.50    Years: 38.00    Total pack years: 19.00    Types: Cigarettes     Quit date: 06/19/2022    Years since quitting: 0.0   Smokeless tobacco: Never   Tobacco comments:    States she has not smoked this week and is attempting to quit.   Vaping Use   Vaping Use: Never used  Substance and Sexual Activity   Alcohol use: No    Alcohol/week: 0.0 standard drinks of alcohol   Drug use: No  Sexual activity: Not Currently    Birth control/protection: Surgical    Comment: hyst  Other Topics Concern   Not on file  Social History Narrative   Not on file   Social Determinants of Health   Financial Resource Strain: Low Risk  (06/01/2021)   Overall Financial Resource Strain (CARDIA)    Difficulty of Paying Living Expenses: Not very hard  Food Insecurity: No Food Insecurity (06/01/2021)   Hunger Vital Sign    Worried About Running Out of Food in the Last Year: Never true    Ran Out of Food in the Last Year: Never true  Transportation Needs: No Transportation Needs (06/01/2021)   PRAPARE - Hydrologist (Medical): No    Lack of Transportation (Non-Medical): No  Physical Activity: Insufficiently Active (06/01/2021)   Exercise Vital Sign    Days of Exercise per Week: 1 day    Minutes of Exercise per Session: 10 min  Stress: No Stress Concern Present (06/01/2021)   [place]    Feeling of Stress : Only a little  Social Connections: Moderately Isolated (06/01/2021)   Social Connection and Isolation Panel [NHANES]    Frequency of Communication with Friends and Family: Twice a week    Frequency of Social Gatherings with Friends and Family: Once a week    Attends Religious Services: 1 to 4 times per year    Active Member of [employer] or Organizations: No    Attends Archivist Meetings: Never    Marital Status: Separated  Intimate Partner Violence: Not At Risk (06/01/2021)   Humiliation, Afraid, Rape, and Kick questionnaire    Fear of Current or Ex-Partner: No     Emotionally Abused: No    Physically Abused: No    Sexually Abused: No    Family History:    Family History  Problem Relation Age of Onset   Stroke Mother    Drug abuse Mother    Arthritis Mother    Arthritis Father    Colon cancer Father        diagnosed in his 69s   Drug abuse Father    Hypertension Sister    Asthma Sister    HIV/AIDS Brother    Colon cancer Paternal Uncle        33s   Anesthesia problems Neg Hx    Hypotension Neg Hx    Malignant hyperthermia Neg Hx    Pseudochol deficiency Neg Hx      ROS:  Please see the history of present illness.   All other ROS reviewed and negative.     Physical Exam/Data:   Vitals:   06/27/22 1146 06/27/22 1146 06/27/22 1300  BP:  (!) 137/94 137/85  Pulse:  91 91  Resp:  20 (!) 37  Temp:  98 F (36.7 C)   TempSrc:  Oral   SpO2:  98% 99%  Weight: 109.3 kg    Height: '5\' 6"'$  (1.676 m)     No intake or output data in the 24 hours ending 06/27/22 1602    06/27/2022   11:46 AM 06/27/2022    9:59 AM 06/23/2022   12:59 PM  Last 3 Weights  Weight (lbs) 241 lb 240 lb 241 lb 6.4 oz  Weight (kg) 109.317 kg 108.863 kg 109.498 kg     Body mass index is 38.9 kg/m.  General: Well developed, well nourished AAF, in no acute distress. Head: Normocephalic, atraumatic, sclera non-icteric,  no xanthomas, nares are without discharge. Neck: Negative for carotid bruits. JVP not elevated. Lungs: Clear bilaterally to auscultation without wheezes, rales, or rhonchi. Breathing is unlabored. Heart: RRR S1 S2 without murmurs, rubs, or gallops.  Abdomen: Soft, non-tender, non-distended with normoactive bowel sounds. No rebound/guarding. Extremities: No clubbing or cyanosis. Trace-1+ BLE edema. Distal pedal pulses are 2+ and equal bilaterally. Neuro: Alert and oriented X 3. Moves all extremities spontaneously. Psych:  Responds to questions appropriately with a normal affect.   EKG:  The EKG was personally reviewed and demonstrates:  NSR 89bpm  nonspecific ST upsloping I, avL, nonspecific TW changes III  Telemetry:  Telemetry was personally reviewed and demonstrates:  NSR  Relevant CV Studies: 2D echo 10/2021  1. Left ventricular ejection fraction, by estimation, is 60 to 65%. The  left ventricle has normal function. The left ventricle has no regional  wall motion abnormalities. There is mild left ventricular hypertrophy.  Left ventricular diastolic parameters  are consistent with Grade I diastolic dysfunction (impaired relaxation).   2. Right ventricular systolic function is normal. The right ventricular  size is normal. There is normal pulmonary artery systolic pressure. The  estimated right ventricular systolic pressure is 09.3 mmHg.   3. Left atrial size was moderately dilated.   4. The mitral valve is grossly normal. Trivial mitral valve  regurgitation.   5. The aortic valve is tricuspid. Aortic valve regurgitation is not  visualized.   6. The inferior vena cava is normal in size with greater than 50%  respiratory variability, suggesting right atrial pressure of 3 mmHg.   Laboratory Data:  High Sensitivity Troponin:   Recent Labs  Lab 06/27/22 1224  TROPONINIHS 3     Chemistry Recent Labs  Lab 06/27/22 1224 06/27/22 1229  NA 139  --   K 3.6  --   CL 102  --   CO2 29  --   GLUCOSE 69*  --   BUN 16  --   CREATININE 0.94  --   CALCIUM 9.5  --   MG  --  1.9  GFRNONAA >60  --   ANIONGAP 8  --     No results for input(s): "PROT", "ALBUMIN", "AST", "ALT", "ALKPHOS", "BILITOT" in the last 168 hours. Lipids No results for input(s): "CHOL", "TRIG", "HDL", "LABVLDL", "LDLCALC", "CHOLHDL" in the last 168 hours.  Hematology Recent Labs  Lab 06/27/22 1224  WBC 6.5  RBC 4.46  HGB 13.3  HCT 40.3  MCV 90.4  MCH 29.8  MCHC 33.0  RDW 14.2  PLT 198   Thyroid No results for input(s): "TSH", "FREET4" in the last 168 hours.  BNP Recent Labs  Lab 06/27/22 1224  BNP 5.0    DDimer  Recent Labs  Lab  06/27/22 1224  DDIMER <0.27     Radiology/Studies:  DG Chest 2 View  Result Date: 06/27/2022 CLINICAL DATA:  67 year old female with shortness of breath. Extremity swelling. Has taken some diuretics. EXAM: CHEST - 2 VIEW COMPARISON:  Portable chest 11/07/2021 and earlier. FINDINGS: PA and lateral views at 1207 hours. Lung volumes and mediastinal contours are normal aside from mild chronic tortuosity of the descending thoracic aorta. Visualized tracheal air column is within normal limits. No pneumothorax or pleural effusion. Pulmonary vascularity within normal limits, no edema or confluent pulmonary opacity. No acute osseous abnormality identified. Negative visible bowel gas. IMPRESSION: No acute cardiopulmonary abnormality. Electronically Signed   By: Genevie Ann M.D.   On: 06/27/2022 12:19  Assessment and Plan:   1. Exertional dyspnea, dx of chronic HFpEF, exertional chest tightness - exam difficult with morbid obesity though BNP low, troponin negative, CXR clear, normal oxygenation recorded - received '120mg'$  IV Lasix in ED, volume status looks OK though still with persistent DOE, unclear that her SOB is completely related to CHF - per d/w Dr. Harrington Challenger and patient, recommend transfer to [facility] on medicine service for more definitive R+LHC for evaluation to more definitively assess volume status/LVEDP and coronary arteries in case symptoms are related to CAD (given atherosclerosis of aorta seen on CT) - + tobacco/vaping history noted, so question component of undiagnosed COPD - suggest ambulatory O2 sat assessment prior to DC to ensure no home O2 needs. If cardiac workup reassuring, recommend to consider outpatient PFTs + pulm appt at discharge - given K 3.6 prior to '120mg'$  IV Lasix, will supplement  - hold off further IV/oral Lasix - notified Trish, cardmaster, of plan. She is on cath schedule tomorrow at 11:30am - will utilize conservative dose IVF pre-cath given CHF, will need to follow BMET in  AM  Shared Decision Making/Informed Consent The risks [stroke (1 in 1000), death (1 in 11), kidney failure [usually temporary] (1 in 500), bleeding (1 in 200), allergic reaction [possibly serious] (1 in 200)], benefits (diagnostic support and management of coronary artery disease) and alternatives of a cardiac catheterization were discussed in detail with Ashley Patrick and she is willing to proceed.   2. Morbid obesity - needs continued OP management of this, sleep study if not previously pursued  3. Essential HTN - BP acceptable, but would consider changing amlodipine to spironolactone after cath given tendency for LE edema  4. HLD - on rosuvastatin, check lipid panel in AM given aortic atherosclerosis and consider titration to achieve goal LDL <70 if indicated  5. Hypoglycemia - no acute symptoms, have ordered recheck CBG with notation to notify primary team if abnormal  Risk Assessment/Risk Scores:     New York Heart Association (NYHA) Functional Class NYHA Class III   For questions or updates, please contact [facility] Please consult [email or web address] for contact info under    Signed, Charlie Pitter, PA-C  06/27/2022 4:02 PM  Patient seen and examined   Agree with findings as noted by D Dunn.  Pt is a 67 yo with hx of HTN, HFpEF, HL, tobacco use, atherosclerosis.  Followed in cardiology, most recently by V Mallipeddi.     Seen this fall for chest pain and SOB   Myoview showed normal perfusion.  Echo showed LVEF 60 to 65% with Gr I diastolic dysfunction     The pt presents because of continued severe DOE and some chest pressure despited increasing doses of diuretice (torsemide 5, 10; Lasix 80)   Seen in [place] offic earlier today    Sent to ER for IV lasix    Currently appear more comfortable after a total of 120 mg IV lasix   ON exam:  Neck is full  No bruits  Lungs are relatively CTA Cardiac RRR  No S3  NO murmurs Abdomen is supple   No massess  No hepatomegaly Ext with  2+ PT pulses  Tr LE edema, improved from earlier today   Given SOB that has been unresponsive to diuretic adjustments, chest pressure with activity and body habitus that makes evaluation of volume status difficult, I would recomm R and L heart catheterization to define pressures/anatomy  Risks / benefits described   Pt understands and  agrees to proceed.   Plan for tomorrow  Follow BP overnight   Replete K   Labs in AM  Dorris Carnes MD

## 2022-06-27 NOTE — Discharge Summary (Deleted)
Physician Discharge Summary   Patient: Ashley Patrick MRN: [id number] DOB: 03-25-1956  Admit date:     06/27/2022  Discharge date: 06/27/22  Discharge Physician: Deatra James   PCP: Denyce Robert, FNP   Recommendations at discharge:   F/up with PCP in 1 week Follow-up with cardiology in 1 week Continue taking your Lasix as prescribed Daily weight if increase in weight greater than 5 pounds call your cardiology office Discharge Diagnoses: Principal Problem:   Acute respiratory failure with hypoxia ([place]) Active Problems:   Anxiety, generalized   CHF (congestive heart failure) ([place])   Essential hypertension   DM II (diabetes mellitus, type II), controlled ([place])   OBESITY   History of colon cancer   Gastroesophageal reflux disease   MDD (major depressive disorder), recurrent, severe, with psychosis ([place])   HLD (hyperlipidemia)   (HFpEF) heart failure with preserved ejection fraction ([place])  Resolved Problems:   * No resolved hospital problems. *  Hospital Course: [name] is a 67 year old female with history of  HFpEF -(grade 1 diastolic) HTN, HLD, anxiety, bipolar,h/o colon CA, GERD , insomnia... Presenting from cardiology office aggressive shortness of breath.  Denies any chest pain Currently patient was seen and followed in the cardiology office over past 5 days, her torsemide was recently stopped was switched to Lasix 80 mg p.o. twice daily.  So noted for volume overload, resulting in shortness of breath.   ED evaluation: Blood pressure 137/85, pulse 91, temperature 98 F (36.7 C), RR  (!) 37, weight 109.3 kg, SpO2 99 % on 2 L of oxygen    Chest x-ray does not reveal any cardiopulmonary abnormalities CBC and CMP within normal limits, glucose 69, D-dimer <0.27 Respiratory panel influenza A/B, RSV, SARS-CoV-2..  All negative  UA moderate leukocyte esterase, negative any nitrite rare bacteria, WBC 11-20   Rx: Received total 120 mg of IV Lasix -Patient was  taken off oxygen, currently satting 98% -No signs of edema in the lower or upper extremities Chest x-ray no signs of cardiopulmonary congestion   Discussed with cardiology-agreed to discharge the patient home and they will follow-up closely  Respiratory distress -Resolved -Resolved, patient was taken off oxygen, satting 98% on room air -No signs of respiratory distress at this point  CHF (congestive heart failure) (HCC) BNP normal, chest x-ray within normal limits, no signs of congestion No significant upper or lower extremity or mid body edema Patient received total of 120 mg of IV Lasix    Anxiety, generalized - Resuming home medication including trazodone, tetrabenazine Latuda   DM II (diabetes mellitus, type II), controlled ([place]) Patient states she is not diabetic she is taking metformin for weight loss Resume home medication of metformin -Last A1c: 6.1 on 04/10/2022  Essential hypertension - Monitoring blood pressure on Lasix IV, -Resuming home medication of Norvasc, -We will holding Benicar  (HFpEF) heart failure with preserved ejection fraction (HCC) HFpEF exacerbation - Last echocardiogram Nov 11, 2021-EEG GAF 60-65%, mild LVH, grade 1 diastolic dysfunction, impaired relaxation, moderate left atrial dilatation, normal valves, Lexiscan May 15, 2022 no evidence of ischemic changes or prior infarct, -Patient has been on 80 mg of Lasix twice daily at home -She received 120 mg of IV Lasix -No signs of volume overload, negative for any pitting edema, chest x-ray clear, BNP within normal limits  Patient will be discharged home within normal Lasix regimen Instruction to weigh herself daily, if increased > 5 pounds   HLD (hyperlipidemia) Continue rosuvastatin  MDD (major  depressive disorder), recurrent, severe, with psychosis ([place]) Stable, continue home medication  Gastroesophageal reflux disease - Continue PPI  History of colon cancer - Follow-up as an  outpatient -In remission  OBESITY Body mass index is 38.9 kg/m. -Counseled regarding healthy diet, exercise, weight loss program Follow-up with a PCP with a plan     Disposition: Home Diet recommendation:  Discharge Diet Orders (From admission, onward)     Start     Ordered   06/27/22 0000  Diet - low sodium heart healthy        06/27/22 1602           Cardiac diet DISCHARGE MEDICATION: Allergies as of 06/27/2022   No Known Allergies      Medication List     TAKE these medications    acetaminophen 650 MG CR tablet Commonly known as: TYLENOL Take 650 mg by mouth every 8 (eight) hours as needed for pain.   albuterol 108 (90 Base) MCG/ACT inhaler Commonly known as: VENTOLIN HFA Inhale 2 puffs into the lungs every 4 (four) hours as needed for wheezing or shortness of breath.   Allergy Relief 10 MG tablet Generic drug: loratadine Take 10 mg by mouth daily.   amLODipine 5 MG tablet Commonly known as: NORVASC Take 5 mg by mouth daily.   aspirin EC 81 MG tablet Take 81 mg by mouth daily. Swallow whole.   cariprazine 1.5 MG capsule Commonly known as: Vraylar Take 1 capsule (1.5 mg total) by mouth daily. Start taking on: August 10, 2022   celecoxib 100 MG capsule Commonly known as: CELEBREX Take 100 mg by mouth 2 (two) times daily as needed for moderate pain.   cyanocobalamin 1000 MCG tablet Commonly known as: VITAMIN B12 Take 1,000 mcg by mouth daily.   fluorometholone 0.1 % ophthalmic suspension Commonly known as: FML Place 2 drops into both eyes 2 (two) times daily.   fluticasone 50 MCG/ACT nasal spray Commonly known as: FLONASE Place 2 sprays into both nostrils daily.   furosemide 80 MG tablet Commonly known as: LASIX Take 1 tablet (80 mg total) by mouth in the morning. & 80 mg at 5:00 pm   Latuda 20 MG Tabs tablet Generic drug: lurasidone Take 20 mg by mouth daily.   metFORMIN 500 MG tablet Commonly known as: GLUCOPHAGE Take 1 tablet  (500 mg total) by mouth every evening. Start taking on: July 13, 2022 What changed: when to take this   multivitamin with minerals tablet Take 1 tablet by mouth daily.   olmesartan 5 MG tablet Commonly known as: BENICAR Take 2 tablets (10 mg total) by mouth daily. What changed: how much to take   omeprazole 20 MG capsule Commonly known as: PRILOSEC Take 20 mg by mouth daily.   potassium chloride SA 20 MEQ tablet Commonly known as: KLOR-CON M Take 1 tablet (20 mEq total) by mouth daily.   rosuvastatin 10 MG tablet Commonly known as: CRESTOR Take 1 tablet (10 mg total) by mouth daily.   Spiriva Respimat 1.25 MCG/ACT Aers Generic drug: Tiotropium Bromide Monohydrate Inhale 2 puffs into the lungs daily.   tetrabenazine 12.5 MG tablet Commonly known as: XENAZINE Take 1 tablet (12.5 mg total) by mouth 2 (two) times daily.   traZODone 150 MG tablet Commonly known as: DESYREL Take 300 mg by mouth at bedtime.   zaleplon 5 MG capsule Commonly known as: SONATA Take 1 capsule (5 mg total) by mouth at bedtime as needed for sleep.  Discharge Exam: Filed Weights   06/27/22 1146  Weight: 109.3 kg        General:  AAO x 3,  cooperative, no distress;   HEENT:  Normocephalic, PERRL, otherwise with in Normal limits   Neuro:  CNII-XII intact. , normal motor and sensation, reflexes intact   Lungs:   Clear to auscultation BL, Respirations unlabored,  No wheezes / crackles  Cardio:    S1/S2, RRR, No murmure, No Rubs or Gallops   Abdomen:  Soft, non-tender, bowel sounds active all four quadrants, no guarding or peritoneal signs.  Muscular  skeletal:  Limited exam -global generalized weaknesses - in bed, able to move all 4 extremities,   2+ pulses,  symmetric, No pitting edema  Skin:  Dry, warm to touch, negative for any Rashes,  Wounds: Please see nursing documentation          Condition at discharge: good  The results of significant diagnostics from this  hospitalization (including imaging, microbiology, ancillary and laboratory) are listed below for reference.   Imaging Studies: DG Chest 2 View  Result Date: 06/27/2022 CLINICAL DATA:  67 year old female with shortness of breath. Extremity swelling. Has taken some diuretics. EXAM: CHEST - 2 VIEW COMPARISON:  Portable chest 11/07/2021 and earlier. FINDINGS: PA and lateral views at 1207 hours. Lung volumes and mediastinal contours are normal aside from mild chronic tortuosity of the descending thoracic aorta. Visualized tracheal air column is within normal limits. No pneumothorax or pleural effusion. Pulmonary vascularity within normal limits, no edema or confluent pulmonary opacity. No acute osseous abnormality identified. Negative visible bowel gas. IMPRESSION: No acute cardiopulmonary abnormality. Electronically Signed   By: Genevie Ann M.D.   On: 06/27/2022 12:19   US Venous Img Lower Unilateral Right (DVT)  Result Date: 06/23/2022 CLINICAL DATA:  Right lower extremity pain and swelling for 2 weeks EXAM: RIGHT LOWER EXTREMITY VENOUS DOPPLER ULTRASOUND TECHNIQUE: Gray-scale sonography with compression, as well as color and duplex ultrasound, were performed to evaluate the deep venous system(s) from the level of the common femoral vein through the popliteal and proximal calf veins. COMPARISON:  None available FINDINGS: VENOUS Normal compressibility of the common femoral, superficial femoral, and popliteal veins, as well as the visualized calf veins. Visualized portions of profunda femoral vein and great saphenous vein unremarkable. No filling defects to suggest DVT on grayscale or color Doppler imaging. Doppler waveforms show normal direction of venous flow, normal respiratory plasticity and response to augmentation. Limited views of the contralateral common femoral vein are unremarkable. OTHER None. Limitations: none IMPRESSION: No right lower extremity DVT Electronically Signed   By: Miachel Roux M.D.   On:  06/23/2022 15:50    Microbiology: Results for orders placed or performed during the hospital encounter of 06/27/22  Resp panel by RT-PCR (RSV, Flu A&B, Covid) Anterior Nasal Swab     Status: None   Collection Time: 06/27/22  1:03 PM   Specimen: Anterior Nasal Swab  Result Value Ref Range Status   SARS Coronavirus 2 by RT PCR NEGATIVE NEGATIVE Final    Comment: (NOTE) SARS-CoV-2 target nucleic acids are NOT DETECTED.  The SARS-CoV-2 RNA is generally detectable in upper respiratory specimens during the acute phase of infection. The lowest concentration of SARS-CoV-2 viral copies this assay can detect is 138 copies/mL. A negative result does not preclude SARS-Cov-2 infection and should not be used as the sole basis for treatment or other patient management decisions. A negative result may occur with  improper specimen collection/handling, submission  of specimen other than nasopharyngeal swab, presence of viral mutation(s) within the areas targeted by this assay, and inadequate number of viral copies(<138 copies/mL). A negative result must be combined with clinical observations, patient history, and epidemiological information. The expected result is Negative.  Fact Sheet for Patients:  EntrepreneurPulse.com.au  Fact Sheet for Healthcare Providers:  IncredibleEmployment.be  This test is no t yet approved or cleared by the [place] FDA and  has been authorized for detection and/or diagnosis of SARS-CoV-2 by FDA under an Emergency Use Authorization (EUA). This EUA will remain  in effect (meaning this test can be used) for the duration of the COVID-19 declaration under Section 564(b)(1) of the Act, 21 U.S.C.section 360bbb-3(b)(1), unless the authorization is terminated  or revoked sooner.       Influenza A by PCR NEGATIVE NEGATIVE Final   Influenza B by PCR NEGATIVE NEGATIVE Final    Comment: (NOTE) The Xpert Xpress SARS-CoV-2/FLU/RSV plus  assay is intended as an aid in the diagnosis of influenza from Nasopharyngeal swab specimens and should not be used as a sole basis for treatment. Nasal washings and aspirates are unacceptable for Xpert Xpress SARS-CoV-2/FLU/RSV testing.  Fact Sheet for Patients: EntrepreneurPulse.com.au  Fact Sheet for Healthcare Providers: IncredibleEmployment.be  This test is not yet approved or cleared by the [place] FDA and has been authorized for detection and/or diagnosis of SARS-CoV-2 by FDA under an Emergency Use Authorization (EUA). This EUA will remain in effect (meaning this test can be used) for the duration of the COVID-19 declaration under Section 564(b)(1) of the Act, 21 U.S.C. section 360bbb-3(b)(1), unless the authorization is terminated or revoked.     Resp Syncytial Virus by PCR NEGATIVE NEGATIVE Final    Comment: (NOTE) Fact Sheet for Patients: EntrepreneurPulse.com.au  Fact Sheet for Healthcare Providers: IncredibleEmployment.be  This test is not yet approved or cleared by the [place] FDA and has been authorized for detection and/or diagnosis of SARS-CoV-2 by FDA under an Emergency Use Authorization (EUA). This EUA will remain in effect (meaning this test can be used) for the duration of the COVID-19 declaration under Section 564(b)(1) of the Act, 21 U.S.C. section 360bbb-3(b)(1), unless the authorization is terminated or revoked.  Performed at [facility], [address] [place], [place] [address]     Labs: CBC: Recent Labs  Lab 06/27/22 1224  WBC 6.5  HGB 13.3  HCT 40.3  MCV 90.4  PLT 267   Basic Metabolic Panel: Recent Labs  Lab 06/27/22 1224 06/27/22 1229  NA 139  --   K 3.6  --   CL 102  --   CO2 29  --   GLUCOSE 69*  --   BUN 16  --   CREATININE 0.94  --   CALCIUM 9.5  --   MG  --  1.9  PHOS  --  2.7   Liver Function Tests: No results for input(s): "AST",  "ALT", "ALKPHOS", "BILITOT", "PROT", "ALBUMIN" in the last 168 hours. CBG: No results for input(s): "GLUCAP" in the last 168 hours.  Discharge time spent: greater than 30 minutes.  Signed: Deatra James, MD [facility] 06/27/2022

## 2022-06-27 NOTE — Assessment & Plan Note (Signed)
-  We will holding home medication of metformin -Checking CBG at bedtime, SSI coverage -Last A1c: 6.1 on 04/10/2022

## 2022-06-27 NOTE — Assessment & Plan Note (Signed)
Continue rosuvastatin.  

## 2022-06-27 NOTE — Assessment & Plan Note (Addendum)
-   Follow-up as an outpatient -In remission

## 2022-06-27 NOTE — ED Provider Notes (Addendum)
Patient evaluated by internal medicine hospitalist.  Patient referred in from [facility] cardiology clinic for dyspnea on exertion has been ongoing for a few weeks.  Patient's oxygen sats are 94% on room air.  Extensive workup here BNP not elevated D-dimer not elevated patient's cold and influenza and RSV testing negative.  Urinalysis negative.  Patient metabolic panel normal renal function normal.  CBC normal.  Troponin was 3.  Delta troponin not required.  Chest x-ray without any acute findings.  Internal medicine has put an extensive note in explaining their thought process on why patient does not meet criteria for admission.  Have patient follow-up with the [facility] cardiology clinic.  Patient will return for any new or worse symptoms.  I have seen the patient myself she is in no acute distress.  Patient okay with being discharged home.   Fredia Sorrow, MD 06/27/22 1620  Addendum: Cardiology had a conversation with the hospitalist.  And they like to do additional workup so they are recommending that she be admitted.  Patient is back on for admission hospitalist has put her back on for admission orders.    Fredia Sorrow, MD 06/27/22 [phone]

## 2022-06-27 NOTE — Hospital Course (Signed)
Ashley Patrick is a 67 year old female with history of  HFpEF -(grade 1 diastolic) HTN, HLD, anxiety, bipolar,h/o colon CA, GERD , insomnia... Presenting from cardiology office aggressive shortness of breath.  Denies any chest pain Currently patient was seen and followed in the cardiology office over past 5 days, her torsemide was recently stopped was switched to Lasix 80 mg p.o. twice daily.  So noted for volume overload, resulting in shortness of breath.   ED evaluation: Blood pressure 137/85, pulse 91, temperature 98 F (36.7 C), RR  (!) 37, weight 109.3 kg, SpO2 99 % on 2 L of oxygen    Chest x-ray does not reveal any cardiopulmonary abnormalities CBC and CMP within normal limits, glucose 69, D-dimer <0.27 Respiratory panel influenza A/B, RSV, SARS-CoV-2..  All negative  UA moderate cassette esterase, negative any nitrite rare bacteria, WBC 11-20   EDP requested per cardiology to be admitted for further diuresing

## 2022-06-27 NOTE — Assessment & Plan Note (Signed)
Management as above °

## 2022-06-27 NOTE — Assessment & Plan Note (Signed)
-  Admitted to telemetry floor under close observation  -continue supplemental O2, maintaining O2 sat> 94% -Not O2 dependent at baseline -Likely exacerbated by volume overload, CHF exacerbation -Continuing diuretics -As needed DuoNeb bronchodilators

## 2022-06-27 NOTE — Assessment & Plan Note (Signed)
Stable, continue home medication

## 2022-06-27 NOTE — Assessment & Plan Note (Signed)
-   Monitoring blood pressure on Lasix IV, -Resuming home medication of Norvasc, -We will holding Benicar

## 2022-06-27 NOTE — ED Provider Notes (Signed)
[facility] Provider Note  CSN: [id number] Arrival date & time: 06/27/22 1110  Chief Complaint(s) Shortness of Breath  HPI Ashley Patrick is a 67 y.o. female with PMH bipolar disorder, HTN, HLD, CHF who presents emergency department for evaluation of shortness of breath and weight gain.  Patient was seen at her cardiology office today who had concern for fluid overload and failure of outpatient Lasix therapy and was sent to the emergency department for further evaluation and need for likely hospital admission and diuresis.  Patient states that over the last 3 weeks she has had a 20 to 30 pound weight gain.  She states that her primary physicians have been increasing her Lasix dose but she has not been urinating any more than usual and is endorsing persistent orthopnea and dyspnea on exertion.  She currently denies chest pain nausea, vomiting or other systemic symptoms.  Patient requiring 2 L nasal cannula to maintain oxygen saturations above 90%.   Past Medical History Past Medical History:  Diagnosis Date   Anxiety    Aortic atherosclerosis (HCC)    Arthritis    Asthma    Back pain    Bipolar disorder ([place])    Cancer ([place]) 2014   Colon Cancer   Constipation    Depression    Dyspnea    Hypercholesteremia    Hypertension    Insomnia    Lateral epicondylitis  of elbow    right   Migraine headache    Nicotine addiction    Obesity    History of   OD (overdose of drug)    hospitalized for 11 days    Panic attacks    Psychosis ([place])    Schizophrenia ([place])    Patient Active Problem List   Diagnosis Date Noted   HLD (hyperlipidemia) 04/18/2022   (HFpEF) heart failure with preserved ejection fraction ([place]) 04/18/2022   Mixed stress and urge urinary incontinence 06/01/2021   Continuous leakage of urine 06/01/2021   S/P hysterectomy 06/01/2021   Cystocele, midline 06/01/2021   OAB (overactive bladder) 06/01/2021   Preoperative cardiovascular examination  01/29/2017   MDD (major depressive disorder), recurrent, severe, with psychosis ([place]) 12/30/2016   Tobacco abuse counseling 09/13/2016   Abnormal CT scan, colon 05/02/2016   Gastroesophageal reflux disease 05/02/2016   Hiatal hernia    History of colonic polyps    Diverticulosis of colon without hemorrhage    Constipation 07/22/2014   History of colon cancer 07/20/2014   Dysphagia 07/20/2014   Lower abdominal pain 09/30/2013   Anxiety, generalized 08/08/2013   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 08/07/2013   Noncompliance with medications 08/07/2013   Colon cancer ([place]) 06/23/2013   Rectal bleeding 05/09/2013   Gastritis 05/09/2013   LLQ pain 11/28/2012   Hemorrhagic cyst of ovary 11/28/2012   Nausea with vomiting 11/28/2012   SHOULDER PAIN, LEFT 05/18/2010   ACUTE BRONCHITIS 09/03/2008   UNSPECIFIED URINARY INCONTINENCE 09/03/2008   ACUTE CYSTITIS 07/19/2008   Abdominal pain, generalized 07/09/2008   OBESITY 11/22/2007   UNSPECIFIED PSYCHOSIS 11/22/2007   MIGRAINE HEADACHE 11/22/2007   HYPERTENSION 11/22/2007   BACK PAIN 11/22/2007   LATERAL EPICONDYLITIS OF ELBOW 11/22/2007   Home Medication(s) Prior to Admission medications   Medication Sig Start Date End Date Taking? Authorizing Provider  acetaminophen (TYLENOL) 650 MG CR tablet Take 650 mg by mouth every 8 (eight) hours as needed for pain.    [provider]  albuterol (VENTOLIN HFA) 108 (90 Base)  MCG/ACT inhaler Inhale 2 puffs into the lungs every 4 (four) hours as needed for wheezing or shortness of breath. 07/21/20   [provider]  ALLERGY RELIEF 10 MG tablet Take 10 mg by mouth daily. 05/23/21   [provider]  amLODipine (NORVASC) 5 MG tablet Take 5 mg by mouth daily.    [provider]  aspirin EC 81 MG tablet Take 81 mg by mouth daily. Swallow whole.    [provider]  cariprazine (VRAYLAR) 1.5 MG capsule Take 1 capsule (1.5 mg total) by mouth  daily. 08/10/22 11/08/22  [name], MD  celecoxib (CELEBREX) 100 MG capsule Take 100 mg by mouth 2 (two) times daily as needed.    [provider]  furosemide (LASIX) 80 MG tablet Take 1 tablet (80 mg total) by mouth in the morning. & 80 mg at 5:00 pm 06/23/22   Mallipeddi, Vishnu P, MD  metFORMIN (GLUCOPHAGE) 500 MG tablet Take 1 tablet (500 mg total) by mouth every evening. 07/13/22 10/11/22  [name], MD  Multiple Vitamins-Minerals (MULTIVITAMIN WITH MINERALS) tablet Take 1 tablet by mouth daily.    [provider]  olmesartan (BENICAR) 5 MG tablet Take 2 tablets (10 mg total) by mouth daily. Patient taking differently: Take 5 mg by mouth daily. 12/14/21 04/19/23  Noemi Chapel, MD  omeprazole (PRILOSEC) 20 MG capsule Take 20 mg by mouth daily. 08/25/20   [provider]  potassium chloride SA (KLOR-CON M) 20 MEQ tablet Take 1 tablet (20 mEq total) by mouth daily. 06/23/22   Mallipeddi, Vishnu P, MD  rosuvastatin (CRESTOR) 10 MG tablet Take 1 tablet (10 mg total) by mouth daily. 04/25/22   Mallipeddi, Vishnu P, MD  SPIRIVA RESPIMAT 1.25 MCG/ACT AERS Inhale 1 puff into the lungs daily. 10/18/20   [provider]  tetrabenazine Delcie Roch) 12.5 MG tablet Take 1 tablet (12.5 mg total) by mouth 2 (two) times daily. 04/18/22 07/17/22  [name], MD  traZODone (DESYREL) 150 MG tablet Take 300 mg by mouth at bedtime.    [provider]  vitamin B-12 (CYANOCOBALAMIN) 1000 MCG tablet Take 1,000 mcg by mouth daily.    [provider]  zaleplon (SONATA) 5 MG capsule Take 1 capsule (5 mg total) by mouth at bedtime as needed for sleep. 06/22/22 09/20/22  [name], MD                                                                                                                                    Past Surgical History Past Surgical History:  Procedure Laterality Date   ABDOMINAL HYSTERECTOMY     BIOPSY N/A 08/13/2014   Procedure: BIOPSY;  Surgeon: Daneil Dolin, MD;  Location: [facility];  Service: Endoscopy;  Laterality: N/A;   BREAST BIOPSY  10/18/2011   Procedure: BREAST BIOPSY;  Surgeon: Jamesetta So, MD;  Location: [facility];  Service: General;  Laterality: Left;  COLON RESECTION N/A 06/23/2013   Procedure: HAND ASSISTED LAPAROSCOPIC PARTIAL COLECTOMY;  Surgeon: Jamesetta So, MD;  Location: [facility];  Service: General;  Laterality: N/A;   COLONOSCOPY     COLONOSCOPY N/A 05/29/2013   BPZ:WCHENID mass most likely representing colorectal cancer S/ P biospy.Multiple colonic and rectal polyps removed/treated as described above. Colonic diverticulosis   COLONOSCOPY WITH PROPOFOL N/A 08/13/2014   RMR: Status post sigmoid colectomy. Multiple colonic polyps removed as described above. Redundant colon. Pan colonic diverticuloisi   COLONOSCOPY WITH PROPOFOL N/A 05/22/2016   Surgeon: Daneil Dolin, MD; 2 polyps removed, but did not survive pathology processing.  Surgical anastomosis in the sigmoid colon noted, appeared patent, question somewhat narrowed just proximal anastomosis.  Repeat in 3 years.   COLONOSCOPY WITH PROPOFOL N/A 08/05/2021   Procedure: COLONOSCOPY WITH PROPOFOL;  Surgeon: Eloise Harman, DO;  Location: [facility];  Service: Endoscopy;  Laterality: N/A;  10:00am   ESOPHAGEAL DILATION N/A 08/13/2014   Procedure: [place];  Surgeon: Daneil Dolin, MD;  Location: [facility];  Service: Endoscopy;  Laterality: N/A;   ESOPHAGOGASTRODUODENOSCOPY N/A 12/26/2012   POE:UMPNTIR reflux esophagitis. Gastric and duodenal bulbar erosions-status post gastric biopsynegative H.pylori   ESOPHAGOGASTRODUODENOSCOPY (EGD) WITH PROPOFOL N/A 08/13/2014   RMR: Small benign cystic-appearing lesion in distal esophagus of doubtful clinical significance, otherwise normal esophagus, status post Maloney dilation. Small hiatal hernia, some retained gastric contents (query delayed gastric emptying.)   partial hysterectomy     POLYPECTOMY  N/A 08/13/2014   Procedure: POLYPECTOMY;  Surgeon: Daneil Dolin, MD;  Location: [facility];  Service: Endoscopy;  Laterality: N/A;   POLYPECTOMY  08/05/2021   Procedure: POLYPECTOMY;  Surgeon: Eloise Harman, DO;  [facility];  Service: Endoscopy;;   Family History Family History  Problem Relation Age of Onset   Stroke Mother    Drug abuse Mother    Arthritis Mother    Arthritis Father    Colon cancer Father        diagnosed in his 35s   Drug abuse Father    Hypertension Sister    Asthma Sister    HIV/AIDS Brother    Colon cancer Paternal Uncle        25s   Anesthesia problems Neg Hx    Hypotension Neg Hx    Malignant hyperthermia Neg Hx    Pseudochol deficiency Neg Hx     Social History Social History   Tobacco Use   Smoking status: Former    Packs/day: 0.50    Years: 38.00    Total pack years: 19.00    Types: Cigarettes    Quit date: 06/19/2022    Years since quitting: 0.0   Smokeless tobacco: Never   Tobacco comments:    States she has not smoked this week and is attempting to quit.   Vaping Use   Vaping Use: Never used  Substance Use Topics   Alcohol use: No    Alcohol/week: 0.0 standard drinks of alcohol   Drug use: No   Allergies Patient has no known allergies.  Review of Systems Review of Systems  Respiratory:  Positive for shortness of breath.   Cardiovascular:  Positive for leg swelling.    Physical Exam Vital Signs  I have reviewed the triage vital signs BP 137/85   Pulse 91   Temp 98 F (36.7 C) (Oral)   Resp (!) 37   Ht '5\' 6"'$  (1.676 m)   Wt  109.3 kg   SpO2 99%   BMI 38.90 kg/m   Physical Exam Vitals and nursing note reviewed.  Constitutional:      General: She is not in acute distress.    Appearance: She is well-developed.  HENT:     Head: Normocephalic and atraumatic.  Eyes:     Conjunctiva/sclera: Conjunctivae normal.  Cardiovascular:     Rate and Rhythm: Normal rate and regular rhythm.     Heart sounds: No  murmur heard. Pulmonary:     Effort: Pulmonary effort is normal. Tachypnea present. No respiratory distress.     Breath sounds: Rales present.  Abdominal:     Palpations: Abdomen is soft.     Tenderness: There is no abdominal tenderness.  Musculoskeletal:        General: No swelling.     Cervical back: Neck supple.     Right lower leg: Edema present.     Left lower leg: Edema present.  Skin:    General: Skin is warm and dry.     Capillary Refill: Capillary refill takes less than 2 seconds.  Neurological:     Mental Status: She is alert.  Psychiatric:        Mood and Affect: Mood normal.     ED Results and Treatments Labs (all labs ordered are listed, but only abnormal results are displayed) Labs Reviewed  BASIC METABOLIC PANEL - Abnormal; Notable for the following components:      Result Value   Glucose, Bld 69 (*)    All other components within normal limits  URINALYSIS, ROUTINE W REFLEX MICROSCOPIC - Abnormal; Notable for the following components:   Color, Urine STRAW (*)    Specific Gravity, Urine 1.004 (*)    Leukocytes,Ua MODERATE (*)    Bacteria, UA RARE (*)    Non Squamous Epithelial 0-5 (*)    All other components within normal limits  RESP PANEL BY RT-PCR (RSV, FLU A&B, COVID)  RVPGX2  CBC  D-DIMER, QUANTITATIVE  BRAIN NATRIURETIC PEPTIDE  TROPONIN I (HIGH SENSITIVITY)  TROPONIN I (HIGH SENSITIVITY)                                                                                                                          Radiology DG Chest 2 View  Result Date: 06/27/2022 CLINICAL DATA:  67 year old female with shortness of breath. Extremity swelling. Has taken some diuretics. EXAM: CHEST - 2 VIEW COMPARISON:  Portable chest 11/07/2021 and earlier. FINDINGS: PA and lateral views at 1207 hours. Lung volumes and mediastinal contours are normal aside from mild chronic tortuosity of the descending thoracic aorta. Visualized tracheal air column is within normal limits.  No pneumothorax or pleural effusion. Pulmonary vascularity within normal limits, no edema or confluent pulmonary opacity. No acute osseous abnormality identified. Negative visible bowel gas. IMPRESSION: No acute cardiopulmonary abnormality. Electronically Signed   By: Genevie Ann M.D.   On: 06/27/2022 12:19    Pertinent labs & imaging results that  were available during my care of the patient were reviewed by me and considered in my medical decision making (see MDM for details).  Medications Ordered in ED Medications  furosemide (LASIX) injection 80 mg (80 mg Intravenous Given 06/27/22 1411)                                                                                                                                     Procedures .Critical Care  Performed by: Teressa Lower, MD Authorized by: Teressa Lower, MD   Critical care provider statement:    Critical care time (minutes):  30   Critical care was necessary to treat or prevent imminent or life-threatening deterioration of the following conditions:  Respiratory failure   Critical care was time spent personally by me on the following activities:  Development of treatment plan with patient or surrogate, discussions with consultants, evaluation of patient's response to treatment, examination of patient, ordering and review of laboratory studies, ordering and review of radiographic studies, ordering and performing treatments and interventions, pulse oximetry, re-evaluation of patient's condition and review of old charts   (including critical care time)  Medical Decision Making / ED Course   This patient presents to the ED for concern of shortness of breath, this involves an extensive number of treatment options, and is a complaint that carries with it a high risk of complications and morbidity.  The differential diagnosis includes CHF exacerbation, ACS, pneumonia, electrolyte abnormality, anemia  MDM: Patient seen emerged part for evaluation  of shortness of breath.  Physical exam reveals tachypnea, faint rales at the bases and bilateral lower extremity edema.  Laboratory evaluation largely unremarkable outside of some mild hyperglycemia which was treated with oral glucose, in the form of juice.  Urinalysis with moderate leuk esterase, 11-20 white blood cells and rare bacteria but patient is currently asymptomatic from a urinary standpoint and we will hold off on antibiotics patient does have a normal BNP here in the ER but this can be falsely low in the setting of obesity and patient does appear clinically fluid overloaded.  D-dimer is negative.  High-sensitivity troponins are negative.  80 mg IV Lasix given and patient require hospital admission for diuresis   Additional history obtained: -Additional history obtained from husband -External records from outside source obtained and reviewed including: Chart review including previous notes, labs, imaging, consultation notes   Lab Tests: -I ordered, reviewed, and interpreted labs.   The pertinent results include:   Labs Reviewed  BASIC METABOLIC PANEL - Abnormal; Notable for the following components:      Result Value   Glucose, Bld 69 (*)    All other components within normal limits  URINALYSIS, ROUTINE W REFLEX MICROSCOPIC - Abnormal; Notable for the following components:   Color, Urine STRAW (*)    Specific Gravity, Urine 1.004 (*)    Leukocytes,Ua MODERATE (*)    Bacteria, UA RARE (*)  Non Squamous Epithelial 0-5 (*)    All other components within normal limits  RESP PANEL BY RT-PCR (RSV, FLU A&B, COVID)  RVPGX2  CBC  D-DIMER, QUANTITATIVE  BRAIN NATRIURETIC PEPTIDE  TROPONIN I (HIGH SENSITIVITY)  TROPONIN I (HIGH SENSITIVITY)      EKG   EKG Interpretation  Date/Time:  Tuesday June 27 2022 11:50:16 EST Ventricular Rate:  89 PR Interval:  142 QRS Duration: 86 QT Interval:  374 QTC Calculation: 455 R Axis:   2 Text Interpretation: Normal sinus rhythm Normal  ECG When compared with ECG of 14-Dec-2021 16:57, QRS axis Shifted right Confirmed by Fredia Sorrow [id number]) on 06/27/2022 4:18:18 PM         Imaging Studies ordered: I ordered imaging studies including chest x-ray I independently visualized and interpreted imaging. I agree with the radiologist interpretation   Medicines ordered and prescription drug management: Meds ordered this encounter  Medications   furosemide (LASIX) injection 80 mg    -I have reviewed the patients home medicines and have made adjustments as needed  Critical interventions Oxygen supplementation, diuresis  Cardiac Monitoring: The patient was maintained on a cardiac monitor.  I personally viewed and interpreted the cardiac monitored which showed an underlying rhythm of: NSR  Social Determinants of Health:  Factors impacting patients care include: none   Reevaluation: After the interventions noted above, I reevaluated the patient and found that they have :improved  Co morbidities that complicate the patient evaluation  Past Medical History:  Diagnosis Date   Anxiety    Aortic atherosclerosis (HCC)    Arthritis    Asthma    Back pain    Bipolar disorder ([place])    Cancer ([place]) 2014   Colon Cancer   Constipation    Depression    Dyspnea    Hypercholesteremia    Hypertension    Insomnia    Lateral epicondylitis  of elbow    right   Migraine headache    Nicotine addiction    Obesity    History of   OD (overdose of drug)    hospitalized for 11 days    Panic attacks    Psychosis ([place])    Schizophrenia ([place])       Dispostion: I considered admission for this patient, and due to fluid overload with new oxygen requirement, patient require hospital admission     Final Clinical Impression(s) / ED Diagnoses Final diagnoses:  None     '@PCDICTATION'$ @    Teressa Lower, MD 06/27/22 1952

## 2022-06-27 NOTE — H&P (Signed)
History and Physical   Patient: Ashley Patrick                            PCP: Denyce Robert, FNP                    DOB: 1956-01-19            DOA: 06/27/2022 TKZ:[id number]             DOS: 06/27/2022, 3:20 PM  Denyce Robert, FNP  Patient coming from:   HOME  I have personally reviewed patient's medical records, in electronic medical records, including:  [place] [facility], and care everywhere.    Chief Complaint:   Chief Complaint  Patient presents with   Shortness of Breath    History of present illness:    Ashley Patrick is a 67 year old female with history of  HFpEF -(grade 1 diastolic) HTN, HLD, anxiety, bipolar,h/o colon CA, GERD , insomnia... Presenting from cardiology office aggressive shortness of breath.  Denies any chest pain Currently patient was seen and followed in the cardiology office over past 5 days, her torsemide was recently stopped was switched to Lasix 80 mg p.o. twice daily.  So noted for volume overload, resulting in shortness of breath.   ED evaluation: Blood pressure 137/85, pulse 91, temperature 98 F (36.7 C), RR  (!) 37, weight 109.3 kg, SpO2 99 % on 2 L of oxygen    Chest x-ray does not reveal any cardiopulmonary abnormalities CBC and CMP within normal limits, glucose 69, D-dimer <0.27 Respiratory panel influenza A/B, RSV, SARS-CoV-2..  All negative  UA moderate cassette esterase, negative any nitrite rare bacteria, WBC 11-20   EDP requested per cardiology to be admitted for further diuresing    Patient Denies having: Fever, Chills, Cough, Chest Pain, Abd pain, N/V/D, headache, dizziness, lightheadedness,  Dysuria, Joint pain, rash, open wounds     Review of Systems: As per HPI, otherwise 10 point review of systems were negative.   ----------------------------------------------------------------------------------------------------------------------  No Known Allergies  Home MEDs:  Prior to Admission medications   Medication Sig  Start Date End Date Taking? Authorizing Provider  acetaminophen (TYLENOL) 650 MG CR tablet Take 650 mg by mouth every 8 (eight) hours as needed for pain.   Yes [provider]  albuterol (VENTOLIN HFA) 108 (90 Base) MCG/ACT inhaler Inhale 2 puffs into the lungs every 4 (four) hours as needed for wheezing or shortness of breath. 07/21/20  Yes [provider]  ALLERGY RELIEF 10 MG tablet Take 10 mg by mouth daily. 05/23/21  Yes [provider]  amLODipine (NORVASC) 5 MG tablet Take 5 mg by mouth daily.   Yes [provider]  aspirin EC 81 MG tablet Take 81 mg by mouth daily. Swallow whole.   Yes [provider]  fluorometholone (FML) 0.1 % ophthalmic suspension SMARTSIG:In Eye(s) 02/13/22  Yes [provider]  fluticasone (FLONASE) 50 MCG/ACT nasal spray Place 2 sprays into both nostrils daily. 03/28/22  Yes [provider]  LATUDA 20 MG TABS tablet Take 20 mg by mouth daily. 03/20/22  Yes [provider]  cariprazine (VRAYLAR) 1.5 MG capsule Take 1 capsule (1.5 mg total) by mouth daily. 08/10/22 11/08/22  [name], MD  celecoxib (CELEBREX) 100 MG capsule Take 100 mg by mouth 2 (two) times daily as needed.    [provider]  furosemide (LASIX) 80 MG tablet Take 1 tablet (80  mg total) by mouth in the morning. & 80 mg at 5:00 pm 06/23/22   Mallipeddi, Vishnu P, MD  metFORMIN (GLUCOPHAGE) 500 MG tablet Take 1 tablet (500 mg total) by mouth every evening. 07/13/22 10/11/22  [name], MD  Multiple Vitamins-Minerals (MULTIVITAMIN WITH MINERALS) tablet Take 1 tablet by mouth daily.    [provider]  olmesartan (BENICAR) 5 MG tablet Take 2 tablets (10 mg total) by mouth daily. Patient taking differently: Take 5 mg by mouth daily. 12/14/21 04/19/23  Noemi Chapel, MD  omeprazole (PRILOSEC) 20 MG capsule Take 20 mg by mouth daily. 08/25/20   [provider]  potassium chloride SA (KLOR-CON M) 20 MEQ tablet Take 1  tablet (20 mEq total) by mouth daily. 06/23/22   Mallipeddi, Vishnu P, MD  rosuvastatin (CRESTOR) 10 MG tablet Take 1 tablet (10 mg total) by mouth daily. 04/25/22   Mallipeddi, Vishnu P, MD  SPIRIVA RESPIMAT 1.25 MCG/ACT AERS Inhale 1 puff into the lungs daily. 10/18/20   [provider]  tetrabenazine Delcie Roch) 12.5 MG tablet Take 1 tablet (12.5 mg total) by mouth 2 (two) times daily. 04/18/22 07/17/22  [name], MD  traZODone (DESYREL) 150 MG tablet Take 300 mg by mouth at bedtime.    [provider]  vitamin B-12 (CYANOCOBALAMIN) 1000 MCG tablet Take 1,000 mcg by mouth daily.    [provider]  zaleplon (SONATA) 5 MG capsule Take 1 capsule (5 mg total) by mouth at bedtime as needed for sleep. 06/22/22 09/20/22  [name], MD    PRN MEDs: sodium chloride, acetaminophen **OR** acetaminophen, bisacodyl, hydrALAZINE, HYDROmorphone (DILAUDID) injection, ipratropium, levalbuterol, ondansetron **OR** ondansetron (ZOFRAN) IV, oxyCODONE, senna-docusate, sodium phosphate, traZODone  Past Medical History:  Diagnosis Date   Anxiety    Aortic atherosclerosis (HCC)    Arthritis    Asthma    Back pain    Bipolar disorder ([place])    Cancer ([place]) 2014   Colon Cancer   Constipation    Depression    Dyspnea    Hypercholesteremia    Hypertension    Insomnia    Lateral epicondylitis  of elbow    right   Migraine headache    Nicotine addiction    Obesity    History of   OD (overdose of drug)    hospitalized for 11 days    Panic attacks    Psychosis ([place])    Schizophrenia ([place])     Past Surgical History:  Procedure Laterality Date   ABDOMINAL HYSTERECTOMY     BIOPSY N/A 08/13/2014   Procedure: BIOPSY;  Surgeon: Daneil Dolin, MD;  Location: [facility];  Service: Endoscopy;  Laterality: N/A;   BREAST BIOPSY  10/18/2011   Procedure: BREAST BIOPSY;  Surgeon: Jamesetta So, MD;  Location: [facility];  Service: General;  Laterality: Left;   COLON RESECTION N/A 06/23/2013    Procedure: HAND ASSISTED LAPAROSCOPIC PARTIAL COLECTOMY;  Surgeon: Jamesetta So, MD;  Location: [facility];  Service: General;  Laterality: N/A;   COLONOSCOPY     COLONOSCOPY N/A 05/29/2013   VEH:MCNOBSJ mass most likely representing colorectal cancer S/ P biospy.Multiple colonic and rectal polyps removed/treated as described above. Colonic diverticulosis   COLONOSCOPY WITH PROPOFOL N/A 08/13/2014   RMR: Status post sigmoid colectomy. Multiple colonic polyps removed as described above. Redundant colon. Pan colonic diverticuloisi   COLONOSCOPY WITH PROPOFOL N/A 05/22/2016   Surgeon: Daneil Dolin, MD; 2 polyps removed, but did not survive pathology processing.  Surgical  anastomosis in the sigmoid colon noted, appeared patent, question somewhat narrowed just proximal anastomosis.  Repeat in 3 years.   COLONOSCOPY WITH PROPOFOL N/A 08/05/2021   Procedure: COLONOSCOPY WITH PROPOFOL;  Surgeon: Eloise Harman, DO;  [facility]: [facility];  Service: Endoscopy;  Laterality: N/A;  10:00am   ESOPHAGEAL DILATION N/A 08/13/2014   Procedure: [place];  Surgeon: Daneil Dolin, MD;  Location: [facility];  Service: Endoscopy;  Laterality: N/A;   ESOPHAGOGASTRODUODENOSCOPY N/A 12/26/2012   DQQ:IWLNLGX reflux esophagitis. Gastric and duodenal bulbar erosions-status post gastric biopsynegative H.pylori   ESOPHAGOGASTRODUODENOSCOPY (EGD) WITH PROPOFOL N/A 08/13/2014   RMR: Small benign cystic-appearing lesion in distal esophagus of doubtful clinical significance, otherwise normal esophagus, status post Maloney dilation. Small hiatal hernia, some retained gastric contents (query delayed gastric emptying.)   partial hysterectomy     POLYPECTOMY N/A 08/13/2014   Procedure: POLYPECTOMY;  Surgeon: Daneil Dolin, MD;  Location: [facility];  Service: Endoscopy;  Laterality: N/A;   POLYPECTOMY  08/05/2021   Procedure: POLYPECTOMY;  Surgeon: Eloise Harman, DO;  [facility];  Service:  Endoscopy;;     reports that she quit smoking 8 days ago. Her smoking use included cigarettes. She has a 19.00 pack-year smoking history. She has never used smokeless tobacco. She reports that she does not drink alcohol and does not use drugs.   Family History  Problem Relation Age of Onset   Stroke Mother    Drug abuse Mother    Arthritis Mother    Arthritis Father    Colon cancer Father        diagnosed in his 22s   Drug abuse Father    Hypertension Sister    Asthma Sister    HIV/AIDS Brother    Colon cancer Paternal Uncle        48s   Anesthesia problems Neg Hx    Hypotension Neg Hx    Malignant hyperthermia Neg Hx    Pseudochol deficiency Neg Hx     Physical Exam:   Vitals:   06/27/22 1146 06/27/22 1146 06/27/22 1300  BP:  (!) 137/94 137/85  Pulse:  91 91  Resp:  20 (!) 37  Temp:  98 F (36.7 C)   TempSrc:  Oral   SpO2:  98% 99%  Weight: 109.3 kg    Height: '5\' 6"'$  (1.676 m)     Constitutional: NAD, calm, comfortable Eyes: PERRL, lids and conjunctivae normal ENMT: Mucous membranes are moist. Posterior pharynx clear of any exudate or lesions.Normal dentition.  Neck: normal, supple, no masses, no thyromegaly Respiratory: clear to auscultation bilaterally, no wheezing, no crackles. Normal respiratory effort. No accessory muscle use.  Cardiovascular: Regular rate and rhythm, no murmurs / rubs / gallops. No extremity edema. 2+ pedal pulses. No carotid bruits.  Abdomen: no tenderness, no masses palpated. No hepatosplenomegaly. Bowel sounds positive.  Musculoskeletal: no clubbing / cyanosis. No joint deformity upper and lower extremities. Good ROM, no contractures. Normal muscle tone.  Neurologic: CN II-XII grossly intact. Sensation intact, DTR normal. Strength 5/5 in all 4.  Psychiatric: Normal judgment and insight. Alert and oriented x 3. Normal mood.  Skin: no rashes, lesions, ulcers. No induration Decubitus/ulcers:  Wounds: per nursing documentation          Labs on admission:    I have personally reviewed following labs and imaging studies  CBC: Recent Labs  Lab 06/27/22 1224  WBC 6.5  HGB 13.3  HCT 40.3  MCV  90.4  PLT 841   Basic Metabolic Panel: Recent Labs  Lab 06/27/22 1224  NA 139  K 3.6  CL 102  CO2 29  GLUCOSE 69*  BUN 16  CREATININE 0.94  CALCIUM 9.5    Urine analysis:    Component Value Date/Time   COLORURINE STRAW (A) 06/27/2022 1300   APPEARANCEUR CLEAR 06/27/2022 1300   LABSPEC 1.004 (L) 06/27/2022 1300   PHURINE 7.0 06/27/2022 1300   GLUCOSEU NEGATIVE 06/27/2022 1300   HGBUR NEGATIVE 06/27/2022 1300   HGBUR small 04/28/2009 1527   BILIRUBINUR NEGATIVE 06/27/2022 1300   KETONESUR NEGATIVE 06/27/2022 1300   PROTEINUR NEGATIVE 06/27/2022 1300   UROBILINOGEN 0.2 11/04/2014 1120   NITRITE NEGATIVE 06/27/2022 1300   LEUKOCYTESUR MODERATE (A) 06/27/2022 1300    Last A1C:  Lab Results  Component Value Date   HGBA1C 5.5 01/01/2017     Radiologic Exams on Admission:   DG Chest 2 View  Result Date: 06/27/2022 CLINICAL DATA:  67 year old female with shortness of breath. Extremity swelling. Has taken some diuretics. EXAM: CHEST - 2 VIEW COMPARISON:  Portable chest 11/07/2021 and earlier. FINDINGS: PA and lateral views at 1207 hours. Lung volumes and mediastinal contours are normal aside from mild chronic tortuosity of the descending thoracic aorta. Visualized tracheal air column is within normal limits. No pneumothorax or pleural effusion. Pulmonary vascularity within normal limits, no edema or confluent pulmonary opacity. No acute osseous abnormality identified. Negative visible bowel gas. IMPRESSION: No acute cardiopulmonary abnormality. Electronically Signed   By: Genevie Ann M.D.   On: 06/27/2022 12:19    EKG:   Independently reviewed.  Orders placed or performed during the hospital encounter of 06/27/22   ED EKG   ED EKG   EKG 12-Lead   EKG 12-Lead   EKG 12-Lead    ---------------------------------------------------------------------------------------------------------------------------------------    Assessment / Plan:   Principal Problem:   Acute respiratory failure with hypoxia (HCC) Active Problems:   Anxiety, generalized   CHF (congestive heart failure) (HCC)   Essential hypertension   DM II (diabetes mellitus, type II), controlled ([place])   OBESITY   History of colon cancer   Gastroesophageal reflux disease   MDD (major depressive disorder), recurrent, severe, with psychosis ([place])   HLD (hyperlipidemia)   (HFpEF) heart failure with preserved ejection fraction (HCC)   Assessment and Plan: * Acute respiratory failure with hypoxia ([place]) -Admitted to telemetry floor under close observation  -continue supplemental O2, maintaining O2 sat> 94% -Not O2 dependent at baseline -Likely exacerbated by volume overload, CHF exacerbation -Continuing diuretics -As needed DuoNeb bronchodilators  CHF (congestive heart failure) (HCC) Management as above  Anxiety, generalized - Resuming home medication including trazodone, tetrabenazine Latuda -As needed Xanax  DM II (diabetes mellitus, type II), controlled ([place]) -We will holding home medication of metformin -Checking CBG at bedtime, SSI coverage -Last A1c: 6.1 on 04/10/2022  Essential hypertension - Monitoring blood pressure on Lasix IV, -Resuming home medication of Norvasc, -We will holding Benicar  (HFpEF) heart failure with preserved ejection fraction (HCC) HFpEF exacerbation - Last echocardiogram Nov 11, 2021-EEG GAF 60-65%, mild LVH, grade 1 diastolic dysfunction, impaired relaxation, moderate left atrial dilatation, normal valves, Lexiscan May 15, 2022 no evidence of ischemic changes or prior infarct, -Patient has been on 80 mg of Lasix twice daily at home Still complaining shortness of breath, minimal diuresis -Getting IV Lasix -Monitoring strict I's and O's - Daily  weight -BMP, RedsClip reading   HLD (hyperlipidemia) Continue rosuvastatin  MDD (major depressive  disorder), recurrent, severe, with psychosis ([place]) Stable, continue home medication  Gastroesophageal reflux disease - Continue PPI  History of colon cancer - Follow-up as an outpatient -In remission  OBESITY Body mass index is 38.9 kg/m. -Counseled regarding healthy diet, exercise, weight loss program Follow-up with a PCP with a plan       Consults called:  Cardiology  -------------------------------------------------------------------------------------------------------------------------------------------- DVT prophylaxis:  heparin injection 5,000 Units Start: 06/27/22 1500 TED hose Start: 06/27/22 1435 SCDs Start: 06/27/22 1435   Code Status:   Code Status: Full Code   Admission status: Patient will be admitted as Observation, with a greater than 2 midnight length of stay. Level of care: Telemetry   Family Communication:  none at bedside  (The above findings and plan of care has been discussed with patient in detail, the patient expressed understanding and agreement of above plan)  --------------------------------------------------------------------------------------------------------------------------------------------------  Disposition Plan:  Anticipated 1-2 days Status is: Observation The patient remains OBS appropriate and will d/c before 2 midnights.     ----------------------------------------------------------------------------------------------------------------------------------------------------  Time spent: > than  16  Min.  Was spent seeing and evaluating the patient, reviewing all medical records, drawn plan of care.  SIGNED: Deatra James, MD, FHM. FAAFP. [place] [facility], Pager  (Please use [email or web address] to page/ or secure chat through epic) If 7PM-7AM, please contact night-coverage [email or web address],  06/27/2022, 3:20 PM

## 2022-06-27 NOTE — Assessment & Plan Note (Signed)
-   Resuming home medication including trazodone, tetrabenazine Latuda -As needed Xanax

## 2022-06-28 ENCOUNTER — Inpatient Hospital Stay (HOSPITAL_COMMUNITY): Payer: 59

## 2022-06-28 DIAGNOSIS — J9601 Acute respiratory failure with hypoxia: Secondary | ICD-10-CM | POA: Diagnosis not present

## 2022-06-28 DIAGNOSIS — I5031 Acute diastolic (congestive) heart failure: Secondary | ICD-10-CM | POA: Diagnosis not present

## 2022-06-28 DIAGNOSIS — I10 Essential (primary) hypertension: Secondary | ICD-10-CM

## 2022-06-28 DIAGNOSIS — I5032 Chronic diastolic (congestive) heart failure: Secondary | ICD-10-CM

## 2022-06-28 LAB — BASIC METABOLIC PANEL
Anion gap: 9 (ref 5–15)
Anion gap: 6 (ref 5–15)
BUN: 21 mg/dL (ref 8–23)
BUN: 21 mg/dL (ref 8–23)
CO2: 29 mmol/L (ref 22–32)
CO2: 28 mmol/L (ref 22–32)
Calcium: 9.4 mg/dL (ref 8.9–10.3)
Calcium: 9.7 mg/dL (ref 8.9–10.3)
Chloride: 101 mmol/L (ref 98–111)
Chloride: 104 mmol/L (ref 98–111)
Creatinine, Ser: 1.13 mg/dL — ABNORMAL HIGH (ref 0.44–1.00)
Creatinine, Ser: 1.25 mg/dL — ABNORMAL HIGH (ref 0.44–1.00)
GFR, Estimated: 54 mL/min — ABNORMAL LOW (ref >60)
GFR, Estimated: 48 mL/min — ABNORMAL LOW (ref >60)
Glucose, Bld: 108 mg/dL — ABNORMAL HIGH (ref 70–99)
Glucose, Bld: 144 mg/dL — ABNORMAL HIGH (ref 70–99)
Potassium: 4.1 mmol/L (ref 3.5–5.1)
Potassium: 3.8 mmol/L (ref 3.5–5.1)
Sodium: 139 mmol/L (ref 135–145)
Sodium: 138 mmol/L (ref 135–145)

## 2022-06-28 LAB — CBC
HCT: 39.4 % (ref 36.0–46.0)
Hemoglobin: 13 g/dL (ref 12.0–15.0)
MCH: 29.3 pg (ref 26.0–34.0)
MCHC: 33 g/dL (ref 30.0–36.0)
MCV: 88.9 fL (ref 80.0–100.0)
Platelets: 191 10*3/uL (ref 150–400)
RBC: 4.43 MIL/uL (ref 3.87–5.11)
RDW: 14.1 % (ref 11.5–15.5)
WBC: 6.3 10*3/uL (ref 4.0–10.5)
nRBC: 0 % (ref 0.0–0.2)

## 2022-06-28 LAB — BRAIN NATRIURETIC PEPTIDE: B Natriuretic Peptide: 4 pg/mL (ref 0.0–100.0)

## 2022-06-28 LAB — ECHOCARDIOGRAM COMPLETE
AR max vel: 2.83 cm2
AV Area VTI: 3.34 cm2
AV Area mean vel: 3.12 cm2
AV Mean grad: 5 mmHg
AV Peak grad: 10 mmHg
Ao pk vel: 1.58 m/s
Area-P 1/2: 3.65 cm2
Height: 66 in
MV VTI: 5.23 cm2
S' Lateral: 3.3 cm
Weight: 3856 oz

## 2022-06-28 LAB — LIPID PANEL
Cholesterol: 156 mg/dL (ref 0–200)
HDL: 53 mg/dL (ref >40)
LDL Cholesterol: 80 mg/dL (ref 0–99)
Total CHOL/HDL Ratio: 2.9 RATIO
Triglycerides: 116 mg/dL (ref <150)
VLDL: 23 mg/dL (ref 0–40)

## 2022-06-28 LAB — TSH: TSH: 2.735 u[IU]/mL (ref 0.350–4.500)

## 2022-06-28 LAB — APTT: aPTT: 36 seconds (ref 24–36)

## 2022-06-28 LAB — CBG MONITORING, ED: Glucose-Capillary: 116 mg/dL — ABNORMAL HIGH (ref 70–99)

## 2022-06-28 MED ORDER — POTASSIUM CHLORIDE CRYS ER 20 MEQ PO TBCR
40.0000 meq | EXTENDED_RELEASE_TABLET | Freq: Once | ORAL | Status: AC
  Administered 2022-06-28: 40 meq via ORAL
  Filled 2022-06-28: qty 2

## 2022-06-28 MED ORDER — ROSUVASTATIN CALCIUM 20 MG PO TABS
20.0000 mg | ORAL_TABLET | Freq: Every day | ORAL | Status: DC
  Administered 2022-06-28 – 2022-06-29 (×2): 20 mg via ORAL
  Filled 2022-06-28 (×2): qty 1

## 2022-06-28 MED ORDER — SODIUM CHLORIDE 0.9% FLUSH
3.0000 mL | Freq: Two times a day (BID) | INTRAVENOUS | Status: DC
  Administered 2022-06-28 – 2022-06-29 (×3): 3 mL via INTRAVENOUS

## 2022-06-28 MED ORDER — FUROSEMIDE 10 MG/ML IJ SOLN
10.0000 mg/h | INTRAVENOUS | Status: DC
  Administered 2022-06-28: 10 mg/h via INTRAVENOUS
  Filled 2022-06-28 (×2): qty 20

## 2022-06-28 MED ORDER — FUROSEMIDE 10 MG/ML IJ SOLN: 10.0000 mg/h | INTRAVENOUS | Status: DC

## 2022-06-28 MED ORDER — POTASSIUM CHLORIDE CRYS ER 20 MEQ PO TBCR
20.0000 meq | EXTENDED_RELEASE_TABLET | Freq: Once | ORAL | Status: AC
  Administered 2022-06-28: 20 meq via ORAL
  Filled 2022-06-28: qty 1

## 2022-06-28 MED ORDER — ASPIRIN 81 MG PO CHEW
81.0000 mg | CHEWABLE_TABLET | ORAL | Status: AC
  Administered 2022-06-28: 81 mg via ORAL
  Filled 2022-06-28: qty 1

## 2022-06-28 MED ORDER — ASPIRIN 81 MG PO CHEW: 81.0000 mg | CHEWABLE_TABLET | ORAL | Status: DC

## 2022-06-28 NOTE — TOC Progression Note (Signed)
  Transition of Care (TOC) Screening Note   Patient Details  Name: Ashley Patrick Date of Birth: Feb 02, 1956   Transition of Care [facility]) CM/SW Contact:    Shade Flood, LCSW Phone Number: 06/28/2022, 12:05 PM    Received [facility] consult for HH/DME needs. PT/OT pending and per MD, plan is for pt to transfer to [facility] for Cardiac Cath. TOC at [facility] will follow. Please re-consult TOC if needs arise.  Transition of Care Department [facility]) has reviewed patient and no TOC needs have been identified at this time. We will continue to monitor patient advancement through interdisciplinary progression rounds. If new patient transition needs arise, please place a TOC consult.

## 2022-06-28 NOTE — Progress Notes (Signed)
PROGRESS NOTE    Ashley Patrick  HKV:[id number] DOB: 1955/11/01 DOA: 06/27/2022 PCP: Denyce Robert, FNP   Brief Narrative:    Ashley Patrick is a 67 year old female with history of  HFpEF -(grade 1 diastolic) HTN, HLD, anxiety, bipolar,h/o colon CA, GERD , insomnia... Presenting from cardiology office aggressive shortness of breath.  Denies any chest pain Currently patient was seen and followed in the cardiology office over past 5 days, her torsemide was recently stopped was switched to Lasix 80 mg p.o. twice daily.  So noted for volume overload, resulting in shortness of breath.  -Patient awaiting transfer to [facility] for definitive right and left heart catheterization to be performed on 1/11.  Lasix drip initiated per cardiology today.  Assessment & Plan:   Principal Problem:   Acute respiratory failure with hypoxia (HCC) Active Problems:   Anxiety, generalized   CHF (congestive heart failure) (HCC)   Essential hypertension   DM II (diabetes mellitus, type II), controlled ([place])   OBESITY   History of colon cancer   Gastroesophageal reflux disease   MDD (major depressive disorder), recurrent, severe, with psychosis ([place])   HLD (hyperlipidemia)   (HFpEF) heart failure with preserved ejection fraction (HCC)  Assessment and Plan:   Acute respiratory failure with hypoxia ([place]) -Admitted to telemetry floor under close observation  -continue supplemental O2, maintaining O2 sat> 94% -Not O2 dependent at baseline -Likely exacerbated by volume overload, CHF exacerbation -Given total 120 mg IV Lasix in ED, now on Lasix drip starting 1/10 -As needed DuoNeb bronchodilators   Acute diastolic CHF (congestive heart failure) (HCC) Last echocardiogram 11/12/2358-65% with mild LVH and grade 1 diastolic dysfunction Management as above, with Lasix drip initiated today Plan for transfer to [facility] for right and left heart catheterization to be performed later today to better define  pressures/anatomy-plan for catheterization 1/11 Strict I's and O's, daily weights Will be n.p.o. after midnight   Anxiety, generalized - Resuming home medication including trazodone, tetrabenazine Latuda -As needed Xanax   DM II (diabetes mellitus, type II), controlled ([place]) -We will holding home medication of metformin -Checking CBG at bedtime, SSI coverage -Last A1c: 6.1 on 04/10/2022   Essential hypertension -Hold Norvasc for now due to soft blood pressure readings -We will hold Benicar   HLD (hyperlipidemia) Continue rosuvastatin   MDD (major depressive disorder), recurrent, severe, with psychosis ([place]) Stable, continue home medication   Gastroesophageal reflux disease - Continue PPI   History of colon cancer - Follow-up as an outpatient -In remission   OBESITY Body mass index is 38.9 kg/m. -Counseled regarding healthy diet, exercise, weight loss program Follow-up with a PCP with a plan    DVT prophylaxis: Heparin Code Status: Full Family Communication: Daughter at bedside 1/10 Disposition Plan:  Status is: Inpatient Remains inpatient appropriate because: Need for IV medications and inpatient procedure.   Consultants:  Cardiology  Procedures:  None  Antimicrobials:  None   Subjective: Patient seen and evaluated today with no new acute complaints or concerns. No acute concerns or events noted overnight.  She continues to have ongoing dyspnea with exertion.  Lower extremity edema improved with IV Lasix yesterday.  Objective: Vitals:   06/27/22 2300 06/28/22 0030 06/28/22 0300 06/28/22 0600  BP: 110/68 107/64 115/80 109/69  Pulse: 88 84 76 86  Resp: '20 19 20 16  '$ Temp:      TempSrc:      SpO2: 97% 93% 91% 95%  Weight:      Height:  No intake or output data in the 24 hours ending 06/28/22 0735 Filed Weights   06/27/22 1146  Weight: 109.3 kg    Examination:  General exam: Appears calm and comfortable  Respiratory system: Clear to  auscultation. Respiratory effort normal. Cardiovascular system: S1 & S2 heard, RRR.  Gastrointestinal system: Abdomen is soft Central nervous system: Alert and awake Extremities: No edema Skin: No significant lesions noted Psychiatry: Flat affect.    Data Reviewed: I have personally reviewed following labs and imaging studies  CBC: Recent Labs  Lab 06/27/22 1224 06/28/22 0456  WBC 6.5 6.3  HGB 13.3 13.0  HCT 40.3 39.4  MCV 90.4 88.9  PLT 198 662   Basic Metabolic Panel: Recent Labs  Lab 06/27/22 1224 06/27/22 1229 06/28/22 0456  NA 139  --  139  K 3.6  --  4.1  CL 102  --  101  CO2 29  --  29  GLUCOSE 69*  --  108*  BUN 16  --  21  CREATININE 0.94  --  1.13*  CALCIUM 9.5  --  9.4  MG  --  1.9  --   PHOS  --  2.7  --    GFR: Estimated Creatinine Clearance: 63 mL/min (A) (by C-G formula based on SCr of 1.13 mg/dL (H)). Liver Function Tests: No results for input(s): "AST", "ALT", "ALKPHOS", "BILITOT", "PROT", "ALBUMIN" in the last 168 hours. No results for input(s): "LIPASE", "AMYLASE" in the last 168 hours. No results for input(s): "AMMONIA" in the last 168 hours. Coagulation Profile: Recent Labs  Lab 06/27/22 1229  INR 0.9   Cardiac Enzymes: No results for input(s): "CKTOTAL", "CKMB", "CKMBINDEX", "TROPONINI" in the last 168 hours. BNP (last 3 results) No results for input(s): "PROBNP" in the last 8760 hours. HbA1C: No results for input(s): "HGBA1C" in the last 72 hours. CBG: No results for input(s): "GLUCAP" in the last 168 hours. Lipid Profile: Recent Labs    06/28/22 0456  CHOL 156  HDL 53  LDLCALC 80  TRIG 116  CHOLHDL 2.9   Thyroid Function Tests: Recent Labs    06/28/22 0456  TSH 2.735   Anemia Panel: No results for input(s): "VITAMINB12", "FOLATE", "FERRITIN", "TIBC", "IRON", "RETICCTPCT" in the last 72 hours. Sepsis Labs: No results for input(s): "PROCALCITON", "LATICACIDVEN" in the last 168 hours.  Recent Results (from the past  240 hour(s))  Resp panel by RT-PCR (RSV, Flu A&B, Covid) Anterior Nasal Swab     Status: None   Collection Time: 06/27/22  1:03 PM   Specimen: Anterior Nasal Swab  Result Value Ref Range Status   SARS Coronavirus 2 by RT PCR NEGATIVE NEGATIVE Final    Comment: (NOTE) SARS-CoV-2 target nucleic acids are NOT DETECTED.  The SARS-CoV-2 RNA is generally detectable in upper respiratory specimens during the acute phase of infection. The lowest concentration of SARS-CoV-2 viral copies this assay can detect is 138 copies/mL. A negative result does not preclude SARS-Cov-2 infection and should not be used as the sole basis for treatment or other patient management decisions. A negative result may occur with  improper specimen collection/handling, submission of specimen other than nasopharyngeal swab, presence of viral mutation(s) within the areas targeted by this assay, and inadequate number of viral copies(<138 copies/mL). A negative result must be combined with clinical observations, patient history, and epidemiological information. The expected result is Negative.  Fact Sheet for Patients:  EntrepreneurPulse.com.au  Fact Sheet for Healthcare Providers:  IncredibleEmployment.be  This test is no t yet  approved or cleared by the [place] and  has been authorized for detection and/or diagnosis of SARS-CoV-2 by FDA under an Emergency Use Authorization (EUA). This EUA will remain  in effect (meaning this test can be used) for the duration of the COVID-19 declaration under Section 564(b)(1) of the Act, 21 U.S.C.section 360bbb-3(b)(1), unless the authorization is terminated  or revoked sooner.       Influenza A by PCR NEGATIVE NEGATIVE Final   Influenza B by PCR NEGATIVE NEGATIVE Final    Comment: (NOTE) The Xpert Xpress SARS-CoV-2/FLU/RSV plus assay is intended as an aid in the diagnosis of influenza from Nasopharyngeal swab specimens and should  not be used as a sole basis for treatment. Nasal washings and aspirates are unacceptable for Xpert Xpress SARS-CoV-2/FLU/RSV testing.  Fact Sheet for Patients: EntrepreneurPulse.com.au  Fact Sheet for Healthcare Providers: IncredibleEmployment.be  This test is not yet approved or cleared by the [place] FDA and has been authorized for detection and/or diagnosis of SARS-CoV-2 by FDA under an Emergency Use Authorization (EUA). This EUA will remain in effect (meaning this test can be used) for the duration of the COVID-19 declaration under Section 564(b)(1) of the Act, 21 U.S.C. section 360bbb-3(b)(1), unless the authorization is terminated or revoked.     Resp Syncytial Virus by PCR NEGATIVE NEGATIVE Final    Comment: (NOTE) Fact Sheet for Patients: EntrepreneurPulse.com.au  Fact Sheet for Healthcare Providers: IncredibleEmployment.be  This test is not yet approved or cleared by the [place] FDA and has been authorized for detection and/or diagnosis of SARS-CoV-2 by FDA under an Emergency Use Authorization (EUA). This EUA will remain in effect (meaning this test can be used) for the duration of the COVID-19 declaration under Section 564(b)(1) of the Act, 21 U.S.C. section 360bbb-3(b)(1), unless the authorization is terminated or revoked.  Performed at [facility], [address], [place], [place] [address]          Radiology Studies: DG Chest 2 View  Result Date: 06/27/2022 CLINICAL DATA:  67 year old female with shortness of breath. Extremity swelling. Has taken some diuretics. EXAM: CHEST - 2 VIEW COMPARISON:  Portable chest 11/07/2021 and earlier. FINDINGS: PA and lateral views at 1207 hours. Lung volumes and mediastinal contours are normal aside from mild chronic tortuosity of the descending thoracic aorta. Visualized tracheal air column is within normal limits. No pneumothorax or pleural  effusion. Pulmonary vascularity within normal limits, no edema or confluent pulmonary opacity. No acute osseous abnormality identified. Negative visible bowel gas. IMPRESSION: No acute cardiopulmonary abnormality. Electronically Signed   By: Genevie Ann M.D.   On: 06/27/2022 12:19        Scheduled Meds:  amLODipine  5 mg Oral Daily   aspirin EC  81 mg Oral Daily   cyanocobalamin  1,000 mcg Oral Daily   heparin  5,000 Units Subcutaneous Q8H   multivitamin with minerals  1 tablet Oral Daily   pantoprazole  40 mg Oral Daily   rosuvastatin  10 mg Oral Daily   sodium chloride flush  3 mL Intravenous Q12H   sodium chloride flush  3 mL Intravenous Q12H   sodium chloride flush  3 mL Intravenous Q12H   tetrabenazine  12.5 mg Oral BID   traZODone  300 mg Oral QHS   umeclidinium bromide  1 puff Inhalation Daily   Continuous Infusions:  sodium chloride     sodium chloride 50 mL/hr at 06/28/22 0423     LOS: 1 day    Time  spent: 35 minutes    Aliannah Holstrom Darleen Crocker, DO [facility]  If 7PM-7AM, please contact night-coverage [email or web address] 06/28/2022, 7:35 AM

## 2022-06-28 NOTE — Progress Notes (Signed)
*  PRELIMINARY RESULTS* Echocardiogram 2D Echocardiogram has been performed.  Ashley Patrick 06/28/2022, 9:34 AM

## 2022-06-28 NOTE — ED Notes (Signed)
Pt ambulated self to the restroom well

## 2022-06-28 NOTE — ED Notes (Signed)
Pt received dinner tray and is able to feed self

## 2022-06-28 NOTE — Progress Notes (Addendum)
Clarification. Dr. Dellia Cloud wants to continue Lasix gtt through tomorrow AM, End time set. Confirmed cardmaster aware of change in plans for cath tomorrow.

## 2022-06-28 NOTE — Inpatient Diabetes Management (Signed)
Inpatient Diabetes Program Recommendations  AACE/ADA: New Consensus Statement on Inpatient Glycemic Control   Target Ranges:  Prepandial:   less than 140 mg/dL      Peak postprandial:   less than 180 mg/dL (1-2 hours)      Critically ill patients:  140 - 180 mg/dL    Latest Reference Range & Units 06/23/13 06:58 06/28/22 08:15  Glucose-Capillary 70 - 99 mg/dL 96 116 (H)    Latest Reference Range & Units 06/27/22 12:24 06/28/22 04:56  Glucose 70 - 99 mg/dL 69 (L) 108 (H)   Review of Glycemic Control  Diabetes history: DM2 Outpatient Diabetes medications: Metformin 500 mg QAM Current orders for Inpatient glycemic control: None  Inpatient Diabetes Program Recommendations:    Insulin: While inpatient, may want to consider ordering Novolog 0-6 units TID with meals.  Thanks, Barnie Alderman, RN, MSN, [place] Diabetes Coordinator Inpatient Diabetes Program [phone] (Team Pager from 8am to [place])

## 2022-06-28 NOTE — Progress Notes (Signed)
PM BMET resulted with Cr 1.25, slightly increased from prior, K 3.8. Discussed with Rodman Key, paramedic, caring for patient in ER. Patient denies any SOB and can lie flat now. Urinated 10-15 minutes ago. Per d/w Dr. Dellia Cloud, will discontinue Lasix drip. Will supplement potassium. She recommends KVO pre cath fluids in AM. Otherwise continue plan as outlined. Will need review of BMET in AM.

## 2022-06-28 NOTE — Progress Notes (Addendum)
Progress Note  Patient Name: Ashley Patrick Date of Encounter: 06/28/2022  Primary Cardiologist: Chalmers Guest, MD  Subjective   Feeling well this morning, no SOB at rest. No CP overnight. I/O's not recorded yet, weight pending.  Inpatient Medications    Scheduled Meds:  amLODipine  5 mg Oral Daily   aspirin EC  81 mg Oral Daily   cyanocobalamin  1,000 mcg Oral Daily   heparin  5,000 Units Subcutaneous Q8H   multivitamin with minerals  1 tablet Oral Daily   pantoprazole  40 mg Oral Daily   rosuvastatin  10 mg Oral Daily   sodium chloride flush  3 mL Intravenous Q12H   sodium chloride flush  3 mL Intravenous Q12H   sodium chloride flush  3 mL Intravenous Q12H   tetrabenazine  12.5 mg Oral BID   traZODone  300 mg Oral QHS   umeclidinium bromide  1 puff Inhalation Daily   Continuous Infusions:  sodium chloride     sodium chloride 50 mL/hr at 06/28/22 0423   PRN Meds: sodium chloride, acetaminophen **OR** acetaminophen, bisacodyl, hydrALAZINE, HYDROmorphone (DILAUDID) injection, ipratropium, levalbuterol, ondansetron **OR** ondansetron (ZOFRAN) IV, oxyCODONE, senna-docusate, sodium phosphate, traZODone   Vital Signs    Vitals:   06/27/22 2300 06/28/22 0030 06/28/22 0300 06/28/22 0600  BP: 110/68 107/64 115/80 109/69  Pulse: 88 84 76 86  Resp: '20 19 20 16  '$ Temp:      TempSrc:      SpO2: 97% 93% 91% 95%  Weight:      Height:       No intake or output data in the 24 hours ending 06/28/22 0748    06/27/2022   11:46 AM 06/27/2022    9:59 AM 06/23/2022   12:59 PM  Last 3 Weights  Weight (lbs) 241 lb 240 lb 241 lb 6.4 oz  Weight (kg) 109.317 kg 108.863 kg 109.498 kg     Telemetry    NSR/low level sinus tach - Personally Reviewed  ECG    Reviewed yesterday - Personally Reviewed  Physical Exam   GEN: No acute distress.  HEENT: Normocephalic, atraumatic, sclera non-icteric. Neck: No JVD or bruits. Cardiac: RRR no murmurs, rubs, or gallops.  Respiratory:  Clear to auscultation bilaterally. Breathing is unlabored. GI: Soft, nontender, non-distended, BS +x 4. MS: no deformity. Extremities: No clubbing or cyanosis. Trace pitting LE edema. Distal pedal pulses are 2+ and equal bilaterally. Neuro:  AAOx3. Follows commands. Psych:  Responds to questions appropriately with a normal affect.  Labs    High Sensitivity Troponin:   Recent Labs  Lab 06/27/22 1224 06/27/22 1654  TROPONINIHS 3 3      Cardiac EnzymesNo results for input(s): "TROPONINI" in the last 168 hours. No results for input(s): "TROPIPOC" in the last 168 hours.   Chemistry Recent Labs  Lab 06/27/22 1224 06/28/22 0456  NA 139 139  K 3.6 4.1  CL 102 101  CO2 29 29  GLUCOSE 69* 108*  BUN 16 21  CREATININE 0.94 1.13*  CALCIUM 9.5 9.4  GFRNONAA >60 54*  ANIONGAP 8 9     Hematology Recent Labs  Lab 06/27/22 1224 06/28/22 0456  WBC 6.5 6.3  RBC 4.46 4.43  HGB 13.3 13.0  HCT 40.3 39.4  MCV 90.4 88.9  MCH 29.8 29.3  MCHC 33.0 33.0  RDW 14.2 14.1  PLT 198 191    BNP Recent Labs  Lab 06/27/22 1224 06/28/22 0456  BNP 5.0 4.0  DDimer  Recent Labs  Lab 06/27/22 1224  DDIMER <0.27     Radiology    DG Chest 2 View  Result Date: 06/27/2022 CLINICAL DATA:  67 year old female with shortness of breath. Extremity swelling. Has taken some diuretics. EXAM: CHEST - 2 VIEW COMPARISON:  Portable chest 11/07/2021 and earlier. FINDINGS: PA and lateral views at 1207 hours. Lung volumes and mediastinal contours are normal aside from mild chronic tortuosity of the descending thoracic aorta. Visualized tracheal air column is within normal limits. No pneumothorax or pleural effusion. Pulmonary vascularity within normal limits, no edema or confluent pulmonary opacity. No acute osseous abnormality identified. Negative visible bowel gas. IMPRESSION: No acute cardiopulmonary abnormality. Electronically Signed   By: Genevie Ann M.D.   On: 06/27/2022 12:19    Cardiac Studies    2D Echo 11/11/21   1. Left ventricular ejection fraction, by estimation, is 60 to 65%. The  left ventricle has normal function. The left ventricle has no regional  wall motion abnormalities. There is mild left ventricular hypertrophy.  Left ventricular diastolic parameters  are consistent with Grade I diastolic dysfunction (impaired relaxation).   2. Right ventricular systolic function is normal. The right ventricular  size is normal. There is normal pulmonary artery systolic pressure. The  estimated right ventricular systolic pressure is 29.9 mmHg.   3. Left atrial size was moderately dilated.   4. The mitral valve is grossly normal. Trivial mitral valve  regurgitation.   5. The aortic valve is tricuspid. Aortic valve regurgitation is not  visualized.   6. The inferior vena cava is normal in size with greater than 50%  respiratory variability, suggesting right atrial pressure of 3 mmHg.   Patient Profile     67 y.o. female with HTN, HLD, prior longstanding tobacco abuse (recently quit to vaping), chronic HFpEF, anxiety, aortic atherosclerosis, back pain, bipolar disorder, colon CA, depression, migraines, panic attacks, schizophrenia, prior OD who has several month history of exertional dyspnea and chest tightness despite diuresis.  Assessment & Plan    1. Exertional dyspnea, dx of chronic HFpEF, exertional chest tightness - exam difficult with morbid obesity though BNP low, troponin negative, CXR clear, normal oxygenation recorded - received '120mg'$  IV Lasix in ED on 06/27/21 - she describes a several month history of exertional dyspnea with some exertional chest tightness (without overt chest pain) that has persisted despite duresis - reported being very frustrated that "nobody can figure out what is going on" - per d/w Dr. Harrington Challenger and patient yesterday, we recommended transfer to [facility] on medicine service for more definitive R+LHC for evaluation to more definitively assess volume  status/LVEDP and coronary arteries in case symptoms are related to CAD (given atherosclerosis of aorta seen on CT)  - cath is on for 11:30 today, consented yesterday - + tobacco/vaping history noted, so question component of undiagnosed COPD - suggest ambulatory O2 sat assessment prior to DC to ensure no home O2 needs. If cardiac workup reassuring, recommend to consider outpatient PFTs + pulm appt at discharge - hold off further IV/oral Lasix pending cath today - conservative dose IVF pre-cath - also noted to have tendency for HR 90s/low 100s so could consider adding low dose elective beta blocker to see how she does - BP 106/69 this AM therefore will hold amlodipine (given tendency for edema) ,could consider transitioning this to spironolactone at discharge ADDENDUM: Dr. Dellia Cloud saw patient and states she was unable to lay flat therefore feels she remains acutely decompensated from heart failure standpoint  and recommends Lasix drip at '10mg'$ /hr with 35mq KCl now and afternoon BMET to guide additonal potassium dosing. She reports patient agreeable to plan. She recommends to defer cath until tomorrow, rescheduled for 10:30am with Dr. VIrish Lack Will notify [facility] team so they are aware. Also sent update to IM and nursing team  2. Morbid obesity - needs continued OP management of this, sleep study if not previously pursued   3. Essential HTN - med recs outlined above   4. HLD, aortic atherosclerosis - LDL 80 -> titrate rosuvastatin to '20mg'$  daily - If the patient is tolerating statin at time of follow-up appointment, would consider rechecking liver function/lipid panel in 6-8 weeks.  5. Hypoglycemia - resolved  6. 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  Signed, DCharlie Pitter PA-C 06/28/2022, 7:48 AM

## 2022-06-28 NOTE — Progress Notes (Signed)
PT Cancellation Note  Patient Details Name: Ashley Patrick MRN: [id number] DOB: 11-11-1955   Cancelled Treatment:    Reason Eval/Treat Not Completed: PT screened, no needs identified, will sign off.  Patient ambulating independently in room and hallway per nursing staff.   4:16 PM, 06/28/22 Lonell Grandchild, MPT Physical Therapist with [facility] [phone] office [phone] mobile phone

## 2022-06-29 ENCOUNTER — Inpatient Hospital Stay (HOSPITAL_COMMUNITY)
Admission: EM | Disposition: A | Discharge status: Home / Self Care | Payer: Self-pay | Source: Home / Self Care | Attending: Family Medicine

## 2022-06-29 ENCOUNTER — Other Ambulatory Visit: Payer: Self-pay

## 2022-06-29 ENCOUNTER — Other Ambulatory Visit (HOSPITAL_COMMUNITY): Payer: Self-pay

## 2022-06-29 DIAGNOSIS — I25118 Atherosclerotic heart disease of native coronary artery with other forms of angina pectoris: Secondary | ICD-10-CM

## 2022-06-29 DIAGNOSIS — I2510 Atherosclerotic heart disease of native coronary artery without angina pectoris: Secondary | ICD-10-CM

## 2022-06-29 DIAGNOSIS — J9601 Acute respiratory failure with hypoxia: Secondary | ICD-10-CM | POA: Diagnosis not present

## 2022-06-29 HISTORY — PX: CORONARY STENT INTERVENTION: CATH118234

## 2022-06-29 HISTORY — PX: RIGHT/LEFT HEART CATH AND CORONARY ANGIOGRAPHY: CATH118266

## 2022-06-29 HISTORY — PX: CORONARY ULTRASOUND/IVUS: CATH118244

## 2022-06-29 LAB — BASIC METABOLIC PANEL
Anion gap: 9 (ref 5–15)
BUN: 27 mg/dL — ABNORMAL HIGH (ref 8–23)
CO2: 29 mmol/L (ref 22–32)
Calcium: 9.7 mg/dL (ref 8.9–10.3)
Chloride: 102 mmol/L (ref 98–111)
Creatinine, Ser: 1.29 mg/dL — ABNORMAL HIGH (ref 0.44–1.00)
GFR, Estimated: 46 mL/min — ABNORMAL LOW (ref >60)
Glucose, Bld: 114 mg/dL — ABNORMAL HIGH (ref 70–99)
Potassium: 4.2 mmol/L (ref 3.5–5.1)
Sodium: 140 mmol/L (ref 135–145)

## 2022-06-29 LAB — POCT I-STAT EG7
Acid-Base Excess: 2 mmol/L (ref 0.0–2.0)
Acid-Base Excess: 3 mmol/L — ABNORMAL HIGH (ref 0.0–2.0)
Bicarbonate: 28.8 mmol/L — ABNORMAL HIGH (ref 20.0–28.0)
Bicarbonate: 29.6 mmol/L — ABNORMAL HIGH (ref 20.0–28.0)
Calcium, Ion: 1.31 mmol/L (ref 1.15–1.40)
Calcium, Ion: 1.32 mmol/L (ref 1.15–1.40)
HCT: 39 % (ref 36.0–46.0)
HCT: 39 % (ref 36.0–46.0)
Hemoglobin: 13.3 g/dL (ref 12.0–15.0)
Hemoglobin: 13.3 g/dL (ref 12.0–15.0)
O2 Saturation: 65 %
O2 Saturation: 65 %
Potassium: 4.2 mmol/L (ref 3.5–5.1)
Potassium: 4.2 mmol/L (ref 3.5–5.1)
Sodium: 140 mmol/L (ref 135–145)
Sodium: 140 mmol/L (ref 135–145)
TCO2: 30 mmol/L (ref 22–32)
TCO2: 31 mmol/L (ref 22–32)
pCO2, Ven: 51.3 mmHg (ref 44–60)
pCO2, Ven: 52.7 mmHg (ref 44–60)
pH, Ven: 7.357 (ref 7.25–7.43)
pH, Ven: 7.358 (ref 7.25–7.43)
pO2, Ven: 36 mmHg (ref 32–45)
pO2, Ven: 36 mmHg (ref 32–45)

## 2022-06-29 LAB — POCT I-STAT 7, (LYTES, BLD GAS, ICA,H+H)
Acid-Base Excess: 1 mmol/L (ref 0.0–2.0)
Bicarbonate: 26.8 mmol/L (ref 20.0–28.0)
Calcium, Ion: 1.29 mmol/L (ref 1.15–1.40)
HCT: 38 % (ref 36.0–46.0)
Hemoglobin: 12.9 g/dL (ref 12.0–15.0)
O2 Saturation: 89 %
Potassium: 4 mmol/L (ref 3.5–5.1)
Sodium: 140 mmol/L (ref 135–145)
TCO2: 28 mmol/L (ref 22–32)
pCO2 arterial: 48.3 mmHg — ABNORMAL HIGH (ref 32–48)
pH, Arterial: 7.351 (ref 7.35–7.45)
pO2, Arterial: 60 mmHg — ABNORMAL LOW (ref 83–108)

## 2022-06-29 LAB — POCT ACTIVATED CLOTTING TIME
Activated Clotting Time: 0 seconds
Activated Clotting Time: 390 seconds
Activated Clotting Time: 450 seconds

## 2022-06-29 SURGERY — RIGHT/LEFT HEART CATH AND CORONARY ANGIOGRAPHY
Anesthesia: LOCAL

## 2022-06-29 MED ORDER — ONDANSETRON HCL 4 MG/2ML IJ SOLN: 4.0000 mg | Freq: Four times a day (QID) | INTRAMUSCULAR | Status: DC | PRN

## 2022-06-29 MED ORDER — MIDAZOLAM HCL 2 MG/2ML IJ SOLN
INTRAMUSCULAR | Status: AC
  Filled 2022-06-29: qty 2

## 2022-06-29 MED ORDER — ONDANSETRON HCL 4 MG/2ML IJ SOLN
INTRAMUSCULAR | Status: AC
  Filled 2022-06-29: qty 2

## 2022-06-29 MED ORDER — HEPARIN SODIUM (PORCINE) 1000 UNIT/ML IJ SOLN
INTRAMUSCULAR | Status: DC | PRN
  Administered 2022-06-29: 5000 [IU] via INTRAVENOUS
  Administered 2022-06-29: 7000 [IU] via INTRAVENOUS

## 2022-06-29 MED ORDER — ALPRAZOLAM 0.25 MG PO TABS: 0.2500 mg | ORAL_TABLET | Freq: Two times a day (BID) | ORAL | Status: DC | PRN

## 2022-06-29 MED ORDER — HEPARIN SODIUM (PORCINE) 1000 UNIT/ML IJ SOLN
INTRAMUSCULAR | Status: AC
  Filled 2022-06-29: qty 10

## 2022-06-29 MED ORDER — DICLOFENAC SODIUM 1 % EX GEL
2.0000 g | Freq: Four times a day (QID) | CUTANEOUS | Status: DC
  Filled 2022-06-29 (×2): qty 100

## 2022-06-29 MED ORDER — HYDRALAZINE HCL 20 MG/ML IJ SOLN: 10.0000 mg | INTRAMUSCULAR | Status: AC | PRN

## 2022-06-29 MED ORDER — CLOPIDOGREL BISULFATE 300 MG PO TABS
ORAL_TABLET | ORAL | Status: AC
  Filled 2022-06-29: qty 2

## 2022-06-29 MED ORDER — LIDOCAINE HCL (PF) 1 % IJ SOLN
INTRAMUSCULAR | Status: AC
  Filled 2022-06-29: qty 30

## 2022-06-29 MED ORDER — CLOPIDOGREL BISULFATE 300 MG PO TABS
ORAL_TABLET | ORAL | Status: DC | PRN
  Administered 2022-06-29: 600 mg via ORAL

## 2022-06-29 MED ORDER — SODIUM CHLORIDE 0.9 % IV SOLN: INTRAVENOUS | Status: DC

## 2022-06-29 MED ORDER — SODIUM CHLORIDE 0.9 % IV SOLN: 250.0000 mL | INTRAVENOUS | Status: DC | PRN

## 2022-06-29 MED ORDER — ASPIRIN 81 MG PO CHEW: 81.0000 mg | CHEWABLE_TABLET | ORAL | Status: DC

## 2022-06-29 MED ORDER — VERAPAMIL HCL 2.5 MG/ML IV SOLN
INTRAVENOUS | Status: AC
  Filled 2022-06-29: qty 2

## 2022-06-29 MED ORDER — HEPARIN (PORCINE) IN NACL 1000-0.9 UT/500ML-% IV SOLN
INTRAVENOUS | Status: DC | PRN
  Administered 2022-06-29 (×2): 500 mL

## 2022-06-29 MED ORDER — FENTANYL CITRATE (PF) 100 MCG/2ML IJ SOLN
INTRAMUSCULAR | Status: DC | PRN
  Administered 2022-06-29 (×3): 25 ug via INTRAVENOUS

## 2022-06-29 MED ORDER — ASPIRIN 81 MG PO CHEW
81.0000 mg | CHEWABLE_TABLET | Freq: Every day | ORAL | Status: DC
  Administered 2022-06-30: 81 mg via ORAL
  Filled 2022-06-29: qty 1

## 2022-06-29 MED ORDER — ACETAMINOPHEN 325 MG PO TABS: 650.0000 mg | ORAL_TABLET | ORAL | Status: DC | PRN

## 2022-06-29 MED ORDER — IOHEXOL 350 MG/ML SOLN
INTRAVENOUS | Status: DC | PRN
  Administered 2022-06-29: 80 mL

## 2022-06-29 MED ORDER — FENTANYL CITRATE (PF) 100 MCG/2ML IJ SOLN
INTRAMUSCULAR | Status: AC
  Filled 2022-06-29: qty 2

## 2022-06-29 MED ORDER — LIDOCAINE HCL (PF) 1 % IJ SOLN
INTRAMUSCULAR | Status: DC | PRN
  Administered 2022-06-29 (×2): 2 mL

## 2022-06-29 MED ORDER — ACETAMINOPHEN 325 MG PO TABS
650.0000 mg | ORAL_TABLET | ORAL | Status: DC | PRN
  Administered 2022-06-29: 650 mg via ORAL

## 2022-06-29 MED ORDER — HEPARIN (PORCINE) IN NACL 1000-0.9 UT/500ML-% IV SOLN
INTRAVENOUS | Status: AC
  Filled 2022-06-29: qty 1000

## 2022-06-29 MED ORDER — SODIUM CHLORIDE 0.9 % IV SOLN
INTRAVENOUS | Status: AC | PRN
  Administered 2022-06-29: 250 mL via INTRAVENOUS

## 2022-06-29 MED ORDER — FAMOTIDINE IN NACL 20-0.9 MG/50ML-% IV SOLN
INTRAVENOUS | Status: AC | PRN
  Administered 2022-06-29: 20 mg via INTRAVENOUS

## 2022-06-29 MED ORDER — ACETAMINOPHEN 325 MG PO TABS
ORAL_TABLET | ORAL | Status: AC
  Filled 2022-06-29: qty 2

## 2022-06-29 MED ORDER — METOPROLOL SUCCINATE ER 25 MG PO TB24
25.0000 mg | ORAL_TABLET | Freq: Every day | ORAL | Status: DC
  Administered 2022-06-30: 25 mg via ORAL
  Filled 2022-06-29: qty 1

## 2022-06-29 MED ORDER — SODIUM CHLORIDE 0.9% FLUSH: 3.0000 mL | Freq: Two times a day (BID) | INTRAVENOUS | Status: DC

## 2022-06-29 MED ORDER — SODIUM CHLORIDE 0.9% FLUSH: 3.0000 mL | INTRAVENOUS | Status: DC | PRN

## 2022-06-29 MED ORDER — NITROGLYCERIN 1 MG/10 ML FOR IR/CATH LAB
INTRA_ARTERIAL | Status: AC
  Filled 2022-06-29: qty 10

## 2022-06-29 MED ORDER — ONDANSETRON HCL 4 MG/2ML IJ SOLN
INTRAMUSCULAR | Status: DC | PRN
  Administered 2022-06-29: 4 mg via INTRAVENOUS

## 2022-06-29 MED ORDER — CLOPIDOGREL BISULFATE 75 MG PO TABS
75.0000 mg | ORAL_TABLET | Freq: Every day | ORAL | Status: DC
  Administered 2022-06-30: 75 mg via ORAL
  Filled 2022-06-29: qty 1

## 2022-06-29 MED ORDER — SODIUM CHLORIDE 0.9 % IV SOLN: INTRAVENOUS | Status: AC

## 2022-06-29 MED ORDER — VERAPAMIL HCL 2.5 MG/ML IV SOLN
INTRAVENOUS | Status: DC | PRN
  Administered 2022-06-29: 10 mL via INTRA_ARTERIAL
  Administered 2022-06-29: 5 mL via INTRA_ARTERIAL

## 2022-06-29 MED ORDER — FAMOTIDINE IN NACL 20-0.9 MG/50ML-% IV SOLN
INTRAVENOUS | Status: AC
  Filled 2022-06-29: qty 50

## 2022-06-29 MED ORDER — MIDAZOLAM HCL 2 MG/2ML IJ SOLN
INTRAMUSCULAR | Status: DC | PRN
  Administered 2022-06-29 (×2): 1 mg via INTRAVENOUS

## 2022-06-29 MED ORDER — ASPIRIN 81 MG PO CHEW
81.0000 mg | CHEWABLE_TABLET | ORAL | Status: AC
  Administered 2022-06-29: 81 mg via ORAL
  Filled 2022-06-29: qty 1

## 2022-06-29 MED ORDER — LABETALOL HCL 5 MG/ML IV SOLN: 10.0000 mg | INTRAVENOUS | Status: AC | PRN

## 2022-06-29 SURGICAL SUPPLY — 22 items
BALLN ~~LOC~~ EMERGE MR 4.0X8 (BALLOONS) ×1
BALLOON ~~LOC~~ EMERGE MR 4.0X8 (BALLOONS) IMPLANT
CATH 5FR JL3.5 JR4 ANG PIG MP (CATHETERS) IMPLANT
CATH LAUNCHER 6FR EBU3.5 (CATHETERS) IMPLANT
CATH OPTICROSS HD (CATHETERS) IMPLANT
CATH SWAN GANZ 7F STRAIGHT (CATHETERS) IMPLANT
DEVICE RAD COMP TR BAND LRG (VASCULAR PRODUCTS) IMPLANT
GLIDESHEATH SLEND SS 6F .021 (SHEATH) IMPLANT
GLIDESHEATH SLENDER 7FR .021G (SHEATH) IMPLANT
GUIDEWIRE INQWIRE 1.5J.035X260 (WIRE) IMPLANT
INQWIRE 1.5J .035X260CM (WIRE) ×1
KIT ENCORE 26 ADVANTAGE (KITS) IMPLANT
KIT HEART LEFT (KITS) ×1 IMPLANT
KIT HEMO VALVE WATCHDOG (MISCELLANEOUS) IMPLANT
PACK CARDIAC CATHETERIZATION (CUSTOM PROCEDURE TRAY) ×1 IMPLANT
SHEATH PROBE COVER 6X72 (BAG) IMPLANT
SLED PULL BACK IVUS (MISCELLANEOUS) IMPLANT
STENT SYNERGY XD 3.50X16 (Permanent Stent) IMPLANT
SYNERGY XD 3.50X16 (Permanent Stent) ×1 IMPLANT
TRANSDUCER W/STOPCOCK (MISCELLANEOUS) ×1 IMPLANT
TUBING CIL FLEX 10 FLL-RA (TUBING) ×1 IMPLANT
WIRE RUNTHROUGH .014X180CM (WIRE) IMPLANT

## 2022-06-29 NOTE — Evaluation (Signed)
Occupational Therapy Evaluation and Discharge Patient Details Name: Ashley Patrick MRN: [id number] DOB: [birth year] Today's Date: 06/29/2022   History of Present Illness Pt is a 67 year old woman admitted with shortness of breath and weight gain 2/2 CHF. PMH: bipolar d/o, HTN, HLD, schizophrenia, CHF, obesity, long term tobacco use, aortic atherosclerosis, depression, prior overdose, back pain.   Clinical Impression   Pt is functioning modified independently in mobility and ADLs. Ambulated without AD in hall with VSS on RA. No further OT needs.      Recommendations for follow up therapy are one component of a multi-disciplinary discharge planning process, led by the attending physician.  Recommendations may be updated based on patient status, additional functional criteria and insurance authorization.   Follow Up Recommendations  No OT follow up     Assistance Recommended at Discharge PRN  Patient can return home with the following Assist for transportation    Functional Status Assessment     Equipment Recommendations  None recommended by OT    Recommendations for Other Services       Precautions / Restrictions Precautions Precautions: None      Mobility Bed Mobility Overal bed mobility: Modified Independent                  Transfers Overall transfer level: Independent Equipment used: None                      Balance                                           ADL either performed or assessed with clinical judgement   ADL Overall ADL's : Modified independent                                       General ADL Comments: sits to bathe at times     Vision Ability to See in Adequate Light: 0 Adequate Patient Visual Report: No change from baseline       Perception     Praxis      Pertinent Vitals/Pain Pain Assessment Pain Assessment: Faces Faces Pain Scale: Hurts little more Pain Location: B knees Pain  Descriptors / Indicators: Aching Pain Intervention(s): Monitored during session, Repositioned     Hand Dominance Right   Extremity/Trunk Assessment Upper Extremity Assessment Upper Extremity Assessment: Overall WFL for tasks assessed   Lower Extremity Assessment Lower Extremity Assessment: Overall WFL for tasks assessed       Communication Communication Communication: No difficulties   Cognition Arousal/Alertness: Awake/alert Behavior During Therapy: Flat affect, Anxious Overall Cognitive Status: Within Functional Limits for tasks assessed                                       General Comments       Exercises     Shoulder Instructions      Home Living Family/patient expects to be discharged to:: Private residence Living Arrangements: Non-relatives/Friends Available Help at Discharge: Friend(s);Available PRN/intermittently Type of Home: Apartment Home Access: Stairs to enter Entrance Stairs-Number of Steps: 3   Home Layout: One level     Bathroom Shower/Tub: Teacher, early years/pre: Standard  Home Equipment: Shower seat;Cane - single point          Prior Functioning/Environment Prior Level of Function : Independent/Modified Independent             Mobility Comments: walks with cane ADLs Comments: sits when showering        OT Problem List:        OT Treatment/Interventions:      OT Goals(Current goals can be found in the care plan section)    OT Frequency:      Co-evaluation              AM-PAC OT "6 Clicks" Daily Activity     Outcome Measure Help from another person eating meals?: None Help from another person taking care of personal grooming?: None Help from another person toileting, which includes using toliet, bedpan, or urinal?: None Help from another person bathing (including washing, rinsing, drying)?: None Help from another person to put on and taking off regular upper body clothing?: None Help  from another person to put on and taking off regular lower body clothing?: None 6 Click Score: 24   End of Session    Activity Tolerance: Patient tolerated treatment well Patient left: in chair;with call bell/phone within reach  OT Visit Diagnosis: Muscle weakness (generalized) (M62.81)                Time: 3086-5784 OT Time Calculation (min): 27 min Charges:  OT General Charges $OT Visit: 1 Visit OT Evaluation $OT Eval Low Complexity: 1 Low OT Treatments $Self Care/Home Management : 8-22 mins  Cleta Alberts, OTR/L Acute Rehabilitation Services Office: [phone]   Malka So 06/29/2022, 9:57 AM

## 2022-06-29 NOTE — Progress Notes (Signed)
   Heart Failure Stewardship Pharmacist Progress Note   PCP: Denyce Robert, FNP PCP-Cardiologist: Chalmers Guest, MD    HPI:  67 yo F with PMH of HFpEF, HTN, HLD, tobacco use, anxiety, aortic atherosclerosis, back pain, bipolar disorder, colon cancer, depression, migraines, schizophrenia, and prior OD.  She presented to [facility] clinic for follow up on HFpEF. She continued to have shortness of breath and LE edema after increasing diuretics as outpatient. Weight stable. Sent to ED for IV diuresis. CXR without edema. ECHO 1/10 showed LVEF 60-65%, moderate asymmetric LVH, G1DD, RV normal. R/LHC scheduled today.  Current HF Medications: Beta Blocker: metoprolol XL 25 mg daily  Prior to admission HF Medications: Diuretic: furosemide 80 mg BID ACE/ARB/ARNI: olmesartan 5 mg daily  Pertinent Lab Values: Serum creatinine 1.29, BUN 27, Potassium 4.2, Sodium 140, BNP 4, Magnesium 1.9  Vital Signs: Weight: 233 lbs (admission weight: 233 lbs) Blood pressure: 120/80s  Heart rate: 90s  I/O: incomplete  Medication Assistance / Insurance Benefits Check: Does the patient have prescription insurance?  Yes Type of insurance plan: [place] Medicaid  Outpatient Pharmacy:  Prior to admission outpatient pharmacy: [facility] Is the patient willing to use [facility] at discharge? Yes Is the patient willing to transition their outpatient pharmacy to utilize a [facility] outpatient pharmacy?   Pending    Assessment: 1. Acute on chronic diastolic CHF (LVEF 25-05%). NYHA class III symptoms. - Holding diuretics pending results of cath today. Strict I/Os and daily weights. Keep K>4 and Mag >2 - Agree with adding metoprolol XL 25 mg daily - Consider adding spironolactone and SGLT2i prior to discharge for HFpEF   Plan: 1) Medication changes recommended at this time: - Add Farxiga 10 mg daily after cath  2) Patient assistance: - Farxiga/Jardiance copay $0 - Entresto copay $0  3)  Education  - To  be completed prior to discharge  Kerby Nora, PharmD, BCPS Heart Failure Stewardship Pharmacist Phone [phone]

## 2022-06-29 NOTE — Progress Notes (Signed)
TR Band removed. Level 0, no hematoma, no pain upon palpation. Site dressed with gauze and tegadem.

## 2022-06-29 NOTE — Progress Notes (Addendum)
Rounding Note    Patient Name: Ashley Patrick Date of Encounter: 06/29/2022  [place] Cardiologist: Chalmers Guest, MD   Subjective   Planning for heart cath today.  Inpatient Medications    Scheduled Meds:  aspirin EC  81 mg Oral Daily   cyanocobalamin  1,000 mcg Oral Daily   heparin  5,000 Units Subcutaneous Q8H   multivitamin with minerals  1 tablet Oral Daily   pantoprazole  40 mg Oral Daily   rosuvastatin  20 mg Oral Daily   sodium chloride flush  3 mL Intravenous Q12H   sodium chloride flush  3 mL Intravenous Q12H   sodium chloride flush  3 mL Intravenous Q12H   sodium chloride flush  3 mL Intravenous Q12H   tetrabenazine  12.5 mg Oral BID   traZODone  300 mg Oral QHS   umeclidinium bromide  1 puff Inhalation Daily   Continuous Infusions:  sodium chloride     sodium chloride     sodium chloride 10 mL/hr at 06/29/22 0637   PRN Meds: sodium chloride, sodium chloride, acetaminophen **OR** acetaminophen, bisacodyl, hydrALAZINE, HYDROmorphone (DILAUDID) injection, ipratropium, levalbuterol, ondansetron **OR** ondansetron (ZOFRAN) IV, oxyCODONE, senna-docusate, sodium chloride flush, sodium phosphate, traZODone   Vital Signs    Vitals:   06/28/22 2110 06/28/22 2111 06/29/22 0130 06/29/22 0300  BP:  (!) 142/97 102/60 (!) 123/98  Pulse:   91 99  Resp:  '20 18 18  '$ Temp: 98.3 F (36.8 C)  98.4 F (36.9 C) 98.2 F (36.8 C)  TempSrc:   Oral Oral  SpO2:  99% 93% 93%  Weight: 106 kg  106.1 kg   Height: '5\' 6"'$  (1.676 m)       Intake/Output Summary (Last 24 hours) at 06/29/2022 0653 Last data filed at 06/29/2022 0415 Gross per 24 hour  Intake --  Output 700 ml  Net -700 ml      06/29/2022    1:30 AM 06/28/2022    9:10 PM 06/27/2022   11:46 AM  Last 3 Weights  Weight (lbs) 233 lb 14.5 oz 233 lb 11 oz 241 lb  Weight (kg) 106.1 kg 106 kg 109.317 kg      Telemetry    Sinus to sinus tachycardia HR 90-120s - Personally Reviewed  ECG    No new  tracings - Personally Reviewed  Physical Exam   GEN: No acute distress.   Neck: No JVD Cardiac: RRR, no murmurs, rubs, or gallops.  Respiratory: Clear to auscultation bilaterally. GI: Soft, nontender, non-distended  MS: No edema; No deformity. Neuro:  Nonfocal  Psych: Normal affect   Labs    High Sensitivity Troponin:   Recent Labs  Lab 06/27/22 1224 06/27/22 1654  TROPONINIHS 3 3     Chemistry Recent Labs  Lab 06/27/22 1229 06/28/22 0456 06/28/22 1346 06/29/22 0429  NA  --  139 138 140  K  --  4.1 3.8 4.2  CL  --  101 104 102  CO2  --  '29 28 29  '$ GLUCOSE  --  108* 144* 114*  BUN  --  21 21 27*  CREATININE  --  1.13* 1.25* 1.29*  CALCIUM  --  9.4 9.7 9.7  MG 1.9  --   --   --   GFRNONAA  --  54* 48* 46*  ANIONGAP  --  '9 6 9    '$ Lipids  Recent Labs  Lab 06/28/22 0456  CHOL 156  TRIG 116  HDL 53  [place] 80  CHOLHDL 2.9    Hematology Recent Labs  Lab 06/27/22 1224 06/28/22 0456  WBC 6.5 6.3  RBC 4.46 4.43  HGB 13.3 13.0  HCT 40.3 39.4  MCV 90.4 88.9  MCH 29.8 29.3  MCHC 33.0 33.0  RDW 14.2 14.1  PLT 198 191   Thyroid  Recent Labs  Lab 06/28/22 0456  TSH 2.735    BNP Recent Labs  Lab 06/27/22 1224 06/28/22 0456  BNP 5.0 4.0    DDimer  Recent Labs  Lab 06/27/22 1224  DDIMER <0.27     Radiology    ECHOCARDIOGRAM COMPLETE  Result Date: 06/28/2022    ECHOCARDIOGRAM REPORT   Patient Name:   Ashley Patrick Date of Exam: 06/28/2022 Medical Rec #:  [id number]      Height:       66.0 in Accession #:    [id number]     Weight:       241.0 lb Date of Birth:  1956-01-11      BSA:          2.165 m Patient Age:    67 years       BP:           109/67 mmHg Patient Gender: F              HR:           98 bpm. Exam Location:  [facility] Procedure: 2D Echo, Cardiac Doppler and Color Doppler Indications:    CHF  History:        Patient has prior history of Echocardiogram examinations, most                 recent 11/11/2021. CHF, Signs/Symptoms:Shortness of  Breath; Risk                 Factors:Hypertension, Diabetes, Dyslipidemia and Former Smoker.                 Colon CA.  Sonographer:    Wenda Low Referring Phys: [phone] SEYED A SHAHMEHDI  Sonographer Comments: Patient is obese. IMPRESSIONS  1. Left ventricular ejection fraction, by estimation, is 60 to 65%. The left ventricle has normal function. Left ventricular endocardial border not optimally defined to evaluate regional wall motion. There is moderate asymmetric left ventricular hypertrophy of the lateral segment. Left ventricular diastolic parameters are consistent with Grade I diastolic dysfunction (impaired relaxation).  2. Right ventricular systolic function is normal. The right ventricular size is normal. There is normal pulmonary artery systolic pressure. The estimated right ventricular systolic pressure is 24.5 mmHg.  3. The mitral valve is grossly normal. No evidence of mitral valve regurgitation. No evidence of mitral stenosis.  4. The aortic valve is tricuspid. Aortic valve regurgitation is not visualized. No aortic stenosis is present.  5. The inferior vena cava is normal in size with greater than 50% respiratory variability, suggesting right atrial pressure of 3 mmHg. Comparison(s): No significant change from prior study. FINDINGS  Left Ventricle: Left ventricular ejection fraction, by estimation, is 60 to 65%. The left ventricle has normal function. Left ventricular endocardial border not optimally defined to evaluate regional wall motion. The left ventricular internal cavity size was normal in size. There is moderate asymmetric left ventricular hypertrophy of the lateral segments. Left ventricular diastolic parameters are consistent with Grade I diastolic dysfunction (impaired relaxation). Right Ventricle: The right ventricular size is normal. No increase in right ventricular wall thickness. Right ventricular systolic function is normal.  There is normal pulmonary artery systolic pressure. The  tricuspid regurgitant velocity is 1.79 m/s, and  with an assumed right atrial pressure of 3 mmHg, the estimated right ventricular systolic pressure is 51.8 mmHg. Left Atrium: Left atrial size was normal in size. Right Atrium: Right atrial size was normal in size. Pericardium: Trivial pericardial effusion is present. Mitral Valve: The mitral valve is grossly normal. No evidence of mitral valve regurgitation. No evidence of mitral valve stenosis. MV peak gradient, 4.0 mmHg. The mean mitral valve gradient is 2.0 mmHg. Tricuspid Valve: The tricuspid valve is not well visualized. Tricuspid valve regurgitation is not demonstrated. No evidence of tricuspid stenosis. Aortic Valve: The aortic valve is tricuspid. Aortic valve regurgitation is not visualized. No aortic stenosis is present. Aortic valve mean gradient measures 5.0 mmHg. Aortic valve peak gradient measures 10.0 mmHg. Aortic valve area, by VTI measures 3.34  cm. Pulmonic Valve: The pulmonic valve was not well visualized. Pulmonic valve regurgitation is trivial. No evidence of pulmonic stenosis. Aorta: The aortic root and ascending aorta are structurally normal, with no evidence of dilitation. Venous: The inferior vena cava is normal in size with greater than 50% respiratory variability, suggesting right atrial pressure of 3 mmHg. IAS/Shunts: No atrial level shunt detected by color flow Doppler.  LEFT VENTRICLE PLAX 2D LVIDd:         5.00 cm   Diastology LVIDs:         3.30 cm   LV e' medial:   11.20 cm/s LV PW:         1.50 cm   LV E/e' medial: 4.8 LV IVS:        1.20 cm LVOT diam:     2.10 cm LV SV:         93 LV SV Index:   43 LVOT Area:     3.46 cm  RIGHT VENTRICLE RV Basal diam:  3.20 cm RV Mid diam:    2.40 cm RV S prime:     15.60 cm/s TAPSE (M-mode): 2.4 cm LEFT ATRIUM             Index        RIGHT ATRIUM           Index LA diam:        3.90 cm 1.80 cm/m   RA Area:     14.90 cm LA Vol (A2C):   54.0 ml 24.95 ml/m  RA Volume:   34.00 ml  15.71 ml/m LA  Vol (A4C):   61.8 ml 28.55 ml/m LA Biplane Vol: 58.3 ml 26.93 ml/m  AORTIC VALVE                    PULMONIC VALVE AV Area (Vmax):    2.83 cm     PV Vmax:       1.13 m/s AV Area (Vmean):   3.12 cm     PV Peak grad:  5.1 mmHg AV Area (VTI):     3.34 cm AV Vmax:           158.00 cm/s AV Vmean:          95.700 cm/s AV VTI:            0.279 m AV Peak Grad:      10.0 mmHg AV Mean Grad:      5.0 mmHg LVOT Vmax:         129.00 cm/s LVOT Vmean:        86.200  cm/s LVOT VTI:          0.269 m LVOT/AV VTI ratio: 0.96  AORTA Ao Root diam: 3.20 cm Ao Asc diam:  2.90 cm MITRAL VALVE               TRICUSPID VALVE MV Area (PHT): 3.65 cm    TR Peak grad:   12.8 mmHg MV Area VTI:   5.23 cm    TR Vmax:        179.00 cm/s MV Peak grad:  4.0 mmHg MV Mean grad:  2.0 mmHg    SHUNTS MV Vmax:       1.00 m/s    Systemic VTI:  0.27 m MV Vmean:      60.2 cm/s   Systemic Diam: 2.10 cm MV Decel Time: 208 msec MV E velocity: 53.70 cm/s MV A velocity: 77.90 cm/s MV E/A ratio:  0.69 Vishnu Priya Mallipeddi Electronically signed by Lorelee Cover Mallipeddi Signature Date/Time: 06/28/2022/1:54:06 PM    Final    DG Chest 2 View  Result Date: 06/27/2022 CLINICAL DATA:  67 year old female with shortness of breath. Extremity swelling. Has taken some diuretics. EXAM: CHEST - 2 VIEW COMPARISON:  Portable chest 11/07/2021 and earlier. FINDINGS: PA and lateral views at 1207 hours. Lung volumes and mediastinal contours are normal aside from mild chronic tortuosity of the descending thoracic aorta. Visualized tracheal air column is within normal limits. No pneumothorax or pleural effusion. Pulmonary vascularity within normal limits, no edema or confluent pulmonary opacity. No acute osseous abnormality identified. Negative visible bowel gas. IMPRESSION: No acute cardiopulmonary abnormality. Electronically Signed   By: Genevie Ann M.D.   On: 06/27/2022 12:19    Cardiac Studies   Echo 06/28/22:  1. Left ventricular ejection fraction, by estimation, is 60  to 65%. The  left ventricle has normal function. Left ventricular endocardial border  not optimally defined to evaluate regional wall motion. There is moderate  asymmetric left ventricular  hypertrophy of the lateral segment. Left ventricular diastolic parameters  are consistent with Grade I diastolic dysfunction (impaired relaxation).   2. Right ventricular systolic function is normal. The right ventricular  size is normal. There is normal pulmonary artery systolic pressure. The  estimated right ventricular systolic pressure is 75.9 mmHg.   3. The mitral valve is grossly normal. No evidence of mitral valve  regurgitation. No evidence of mitral stenosis.   4. The aortic valve is tricuspid. Aortic valve regurgitation is not  visualized. No aortic stenosis is present.   5. The inferior vena cava is normal in size with greater than 50%  respiratory variability, suggesting right atrial pressure of 3 mmHg.   Patient Profile     67 y.o. female with HTN, HLD, prior longstanding tobacco abuse (recently quit to vaping), chronic HFpEF, anxiety, aortic atherosclerosis, back pain, bipolar disorder, colon CA, depression, migraines, panic attacks, schizophrenia, prior OD who has several month history of exertional dyspnea and chest tightness despite diuresis.   Assessment & Plan    DOE Exertional chest tightness Acute on Chronic HFpEF Volume status difficult on exam given body habitus. She was treated with lasix gtt at APP in anticipation for [facility] today Despite high doses of lasix, she continued to report SOB, prompting lasix gtt Results of cath today will further guide medication selection for GDMT Continue ASA, not on a BB with HR in the 90s - consider adding this today She remains on supplemental O2, not on oxygen at home I was able to position the  bed flat and she did well but still on 2 pillows, may need to wedge  AKI sCr 1.29 (1.25) Was 0.94 on admission Monitor after heart cath - may  need gentle diuresis  Hypertension PTA: 10 mg olmesartan, 5 mg amlodipine,   Hyperlipidemia  Continue crestor Former smoker,  now vaping Will evaluate coronaries today Pt is tearful at the end of our conversation. Her son received PCI with [place] last year, but died 74 months ago from ?CHF. Also, her sister is here on 6E for "heart surgery."   For questions or updates, please contact [place] Please consult [email or web address] for contact info under    Signed, Ledora Bottcher, PA  06/29/2022, 6:53 AM   As above, patient seen and examined.  She has a cough this morning but states her dyspnea has improved.  There is no chest pain today.  She was transferred from [facility] for right and left cardiac catheterization given progressive dyspnea on exertion and occasional chest tightness.  The risk and benefits include myocardial infarction, CVA and death discussed and she agrees to proceed.  Will hold diuretics for now and await results of right heart catheterization.  Continue aspirin and statin.  Patient with acute kidney injury.  Follow renal function after catheterization.  Will begin low-dose Toprol for blood pressure. Kirk Ruths, MD

## 2022-06-29 NOTE — Progress Notes (Signed)
PROGRESS NOTE    Ashley Patrick  ZHG:[id number] DOB: 1955/11/14 DOA: 06/27/2022 PCP: Denyce Robert, FNP  Chief Complaint  Patient presents with   Shortness of Breath    Brief Narrative:   Ashley Patrick is Ashley Patrick 67 year old female with history of  HFpEF -(grade 1 diastolic) HTN, HLD, anxiety, bipolar,h/o colon CA, GERD , insomnia... Presenting from cardiology office aggressive shortness of breath.  Denies any chest pain Currently patient was seen and followed in the cardiology office over past 5 days, her torsemide was recently stopped was switched to Lasix 80 mg p.o. twice daily.  So noted for volume overload, resulting in shortness of breath.   -Patient awaiting transfer to [facility] for definitive right and left heart catheterization to be performed on 1/11.  Lasix drip initiated per cardiology today.  Assessment & Plan:   Principal Problem:   Acute respiratory failure with hypoxia (HCC) Active Problems:   Anxiety, generalized   CHF (congestive heart failure) (HCC)   Essential hypertension   DM II (diabetes mellitus, type II), controlled ([place])   OBESITY   History of colon cancer   Gastroesophageal reflux disease   Ashley (major depressive disorder), recurrent, severe, with psychosis ([place])   HLD (hyperlipidemia)   (HFpEF) heart failure with preserved ejection fraction (HCC)   Coronary artery disease  Acute respiratory failure with hypoxia (HCC) Improved with diuresis Will follow   Acute diastolic CHF   Mild Pulmonary HTN Last echocardiogram 11/11/21 60-65% with mild LVH and grade 1 diastolic dysfunction Cardiology c/s, appreciate recs Diuresis per cards Echo with EF 19-62%, grade 1 diastolic dysfunction S/p R/LHC with mild pulm HTN, single vessel CAD in circumflex, treated with DES  Coronary Artery Disease s/p DES Single vessel CAD in circumflex, treated with DES, continue DAPT x 6 months with aggressive secondary preventions   Anxiety, generalized - Resuming home medication  including trazodone, tetrabenazine Latuda -As needed Xanax   DM II (diabetes mellitus, type II), controlled ([place]) -We will holding home medication of metformin -Checking CBG at bedtime, SSI coverage -Last A1c: 6.1 on 04/10/2022   Essential hypertension Benicar and norvasc currently on hold   HLD (hyperlipidemia) Continue rosuvastatin   Ashley (major depressive disorder), recurrent, severe, with psychosis ([place]) Stable, continue home medication   Gastroesophageal reflux disease - Continue PPI   History of colon cancer - Follow-up as an outpatient   OBESITY Body mass index is 38.9 kg/m. -Counseled regarding healthy diet, exercise, weight loss program Follow-up with Shoichi Mielke PCP with Annie Roseboom plan    DVT prophylaxis: heparin Code Status: full Family Communication: none Disposition:   Status is: Inpatient Remains inpatient appropriate because: pending final cards recs   Consultants:  cardiology  Procedures:  Echo IMPRESSIONS     1. Left ventricular ejection fraction, by estimation, is 60 to 65%. The  left ventricle has normal function. Left ventricular endocardial border  not optimally defined to evaluate regional wall motion. There is moderate  asymmetric left ventricular  hypertrophy of the lateral segment. Left ventricular diastolic parameters  are consistent with Grade I diastolic dysfunction (impaired relaxation).   2. Right ventricular systolic function is normal. The right ventricular  size is normal. There is normal pulmonary artery systolic pressure. The  estimated right ventricular systolic pressure is 22.9 mmHg.   3. The mitral valve is grossly normal. No evidence of mitral valve  regurgitation. No evidence of mitral stenosis.   4. The aortic valve is tricuspid. Aortic valve regurgitation is not  visualized. No aortic stenosis  is present.   5. The inferior vena cava is normal in size with greater than 50%  respiratory variability, suggesting right atrial pressure of  3 mmHg.   Comparison(s): No significant change from prior study.   R/LHC    Prox RCA to Mid RCA lesion is 25% stenosed.   Mid LAD lesion is 25% stenosed.   Dist Cx lesion is 80% stenosed.   Ashley Patrick drug-eluting stent was successfully placed using Ashley Patrick SYNERGY XD 3.50X16, postdilated to greater than 4 mm and optimized with intravascular ultrasound.   Post intervention, there is Ashley Patrick 0% residual stenosis.   LV end diastolic pressure is normal.   Hemodynamic findings consistent with mild pulmonary hypertension.   There is no aortic valve stenosis.   Aortic saturation 89%, PA saturation 65%, mean right atrial pressure 9 mmHg, PA pressure 33/24, mean PA pressure 29 mmHg, mean pulmonary capillary wedge pressure 15 mmHg, cardiac output 6.7 L/min, cardiac index 3.13.   Mild pulmonary hypertension. Single-vessel coronary artery disease in the circumflex, successfully treated with Tamsen Reist 3.5 x 16 Synergy drug-eluting stent postdilated to greater than 4 mm. Continue dual antiplatelet therapy for 6 months along with aggressive secondary prevention.    Antimicrobials:  Anti-infectives (From admission, onward)    None       Subjective: No complaints Asking about eventual d/c Has pending procedure  Objective: Vitals:   06/29/22 1230 06/29/22 1245 06/29/22 1300 06/29/22 1742  BP: 138/84   (!) 153/91  Pulse: 92 94 87 (!) 109  Resp: '19 19 16   '$ Temp:    99 F (37.2 C)  TempSrc:    Oral  SpO2: 90% 91% (!) 85% 93%  Weight:      Height:        Intake/Output Summary (Last 24 hours) at 06/29/2022 1759 Last data filed at 06/29/2022 0700 Gross per 24 hour  Intake 28.57 ml  Output 200 ml  Net -171.43 ml   Filed Weights   06/27/22 1146 06/28/22 2110 06/29/22 0130  Weight: 109.3 kg 106 kg 106.1 kg    Examination:  General exam: Appears calm and comfortable  Respiratory system: unlabored Cardiovascular system: RRR Gastrointestinal system: Abdomen is nondistended, soft and nontender.  Central  nervous system: Alert and oriented. No focal neurological deficits. Extremities: trace LE edema   Data Reviewed: I have personally reviewed following labs and imaging studies  CBC: Recent Labs  Lab 06/27/22 1224 06/28/22 0456 06/29/22 1026 06/29/22 1034  WBC 6.5 6.3  --   --   HGB 13.3 13.0 12.9 13.3  13.3  HCT 40.3 39.4 38.0 39.0  39.0  MCV 90.4 88.9  --   --   PLT 198 191  --   --     Basic Metabolic Panel: Recent Labs  Lab 06/27/22 1224 06/27/22 1229 06/28/22 0456 06/28/22 1346 06/29/22 0429 06/29/22 1026 06/29/22 1034  NA 139  --  139 138 140 140 140  140  K 3.6  --  4.1 3.8 4.2 4.0 4.2  4.2  CL 102  --  101 104 102  --   --   CO2 29  --  '29 28 29  '$ --   --   GLUCOSE 69*  --  108* 144* 114*  --   --   BUN 16  --  21 21 27*  --   --   CREATININE 0.94  --  1.13* 1.25* 1.29*  --   --   CALCIUM 9.5  --  9.4 9.7  9.7  --   --   MG  --  1.9  --   --   --   --   --   PHOS  --  2.7  --   --   --   --   --     GFR: Estimated Creatinine Clearance: 54.3 mL/min (Satia Winger) (by C-G formula based on SCr of 1.29 mg/dL (H)).  Liver Function Tests: No results for input(s): "AST", "ALT", "ALKPHOS", "BILITOT", "PROT", "ALBUMIN" in the last 168 hours.  CBG: Recent Labs  Lab 06/28/22 0815  GLUCAP 116*     Recent Results (from the past 240 hour(s))  Resp panel by RT-PCR (RSV, Flu Ashley Patrick&B, Covid) Anterior Nasal Swab     Status: None   Collection Time: 06/27/22  1:03 PM   Specimen: Anterior Nasal Swab  Result Value Ref Range Status   SARS Coronavirus 2 by RT PCR NEGATIVE NEGATIVE Final    Comment: (NOTE) SARS-CoV-2 target nucleic acids are NOT DETECTED.  The SARS-CoV-2 RNA is generally detectable in upper respiratory specimens during the acute phase of infection. The lowest concentration of SARS-CoV-2 viral copies this assay can detect is 138 copies/mL. Canyon Lohr negative result does not preclude SARS-Cov-2 infection and should not be used as the sole basis for treatment or other  patient management decisions. Cilicia Borden negative result may occur with  improper specimen collection/handling, submission of specimen other than nasopharyngeal swab, presence of viral mutation(s) within the areas targeted by this assay, and inadequate number of viral copies(<138 copies/mL). Xachary Hambly negative result must be combined with clinical observations, patient history, and epidemiological information. The expected result is Negative.  Fact Sheet for Patients:  EntrepreneurPulse.com.au  Fact Sheet for Healthcare Providers:  IncredibleEmployment.be  This test is no t yet approved or cleared by the [place] FDA and  has been authorized for detection and/or diagnosis of SARS-CoV-2 by FDA under an Emergency Use Authorization (EUA). This EUA will remain  in effect (meaning this test can be used) for the duration of the COVID-19 declaration under Section 564(b)(1) of the Act, 21 U.S.C.section 360bbb-3(b)(1), unless the authorization is terminated  or revoked sooner.       Influenza Ashley Patrick by PCR NEGATIVE NEGATIVE Final   Influenza B by PCR NEGATIVE NEGATIVE Final    Comment: (NOTE) The Xpert Xpress SARS-CoV-2/FLU/RSV plus assay is intended as an aid in the diagnosis of influenza from Nasopharyngeal swab specimens and should not be used as Jazilyn Siegenthaler sole basis for treatment. Nasal washings and aspirates are unacceptable for Xpert Xpress SARS-CoV-2/FLU/RSV testing.  Fact Sheet for Patients: EntrepreneurPulse.com.au  Fact Sheet for Healthcare Providers: IncredibleEmployment.be  This test is not yet approved or cleared by the [place] FDA and has been authorized for detection and/or diagnosis of SARS-CoV-2 by FDA under an Emergency Use Authorization (EUA). This EUA will remain in effect (meaning this test can be used) for the duration of the COVID-19 declaration under Section 564(b)(1) of the Act, 21 U.S.C. section  360bbb-3(b)(1), unless the authorization is terminated or revoked.     Resp Syncytial Virus by PCR NEGATIVE NEGATIVE Final    Comment: (NOTE) Fact Sheet for Patients: EntrepreneurPulse.com.au  Fact Sheet for Healthcare Providers: IncredibleEmployment.be  This test is not yet approved or cleared by the [place] FDA and has been authorized for detection and/or diagnosis of SARS-CoV-2 by FDA under an Emergency Use Authorization (EUA). This EUA will remain in effect (meaning this test can be used) for the duration of the COVID-19 declaration under  Section 564(b)(1) of the Act, 21 U.S.C. section 360bbb-3(b)(1), unless the authorization is terminated or revoked.  Performed at [facility], [address] [place], [place] [address]          Radiology Studies: CARDIAC CATHETERIZATION  Result Date: 06/29/2022   Prox RCA to Mid RCA lesion is 25% stenosed.   Mid LAD lesion is 25% stenosed.   Dist Cx lesion is 80% stenosed.   Dandrae Kustra drug-eluting stent was successfully placed using Ashley Patrick SYNERGY XD 3.50X16, postdilated to greater than 4 mm and optimized with intravascular ultrasound.   Post intervention, there is Kendale Rembold 0% residual stenosis.   LV end diastolic pressure is normal.   Hemodynamic findings consistent with mild pulmonary hypertension.   There is no aortic valve stenosis.   Aortic saturation 89%, PA saturation 65%, mean right atrial pressure 9 mmHg, PA pressure 33/24, mean PA pressure 29 mmHg, mean pulmonary capillary wedge pressure 15 mmHg, cardiac output 6.7 L/min, cardiac index 3.13. Mild pulmonary hypertension. Single-vessel coronary artery disease in the circumflex, successfully treated with Taraoluwa Thakur 3.5 x 16 Synergy drug-eluting stent postdilated to greater than 4 mm. Continue dual antiplatelet therapy for 6 months along with aggressive secondary prevention.   ECHOCARDIOGRAM COMPLETE  Result Date: 06/28/2022    ECHOCARDIOGRAM REPORT   Patient Name:   Ashley Patrick  Brick Date of Exam: 06/28/2022 Medical Rec #:  [id number]      Height:       66.0 in Accession #:    [id number]     Weight:       241.0 lb Date of Birth:  1956/03/07      BSA:          2.165 m Patient Age:    67 years       BP:           109/67 mmHg Patient Gender: F              HR:           98 bpm. Exam Location:  [facility] Procedure: 2D Echo, Cardiac Doppler and Color Doppler Indications:    CHF  History:        Patient has prior history of Echocardiogram examinations, most                 recent 11/11/2021. CHF, Signs/Symptoms:Shortness of Breath; Risk                 Factors:Hypertension, Diabetes, Dyslipidemia and Former Smoker.                 Colon CA.  Sonographer:    Wenda Low Referring Phys: [phone] SEYED Meyah Corle SHAHMEHDI  Sonographer Comments: Patient is obese. IMPRESSIONS  1. Left ventricular ejection fraction, by estimation, is 60 to 65%. The left ventricle has normal function. Left ventricular endocardial border not optimally defined to evaluate regional wall motion. There is moderate asymmetric left ventricular hypertrophy of the lateral segment. Left ventricular diastolic parameters are consistent with Grade I diastolic dysfunction (impaired relaxation).  2. Right ventricular systolic function is normal. The right ventricular size is normal. There is normal pulmonary artery systolic pressure. The estimated right ventricular systolic pressure is 64.4 mmHg.  3. The mitral valve is grossly normal. No evidence of mitral valve regurgitation. No evidence of mitral stenosis.  4. The aortic valve is tricuspid. Aortic valve regurgitation is not visualized. No aortic stenosis is present.  5. The inferior vena cava is normal in size with greater than 50%  respiratory variability, suggesting right atrial pressure of 3 mmHg. Comparison(s): No significant change from prior study. FINDINGS  Left Ventricle: Left ventricular ejection fraction, by estimation, is 60 to 65%. The left ventricle has normal function. Left  ventricular endocardial border not optimally defined to evaluate regional wall motion. The left ventricular internal cavity size was normal in size. There is moderate asymmetric left ventricular hypertrophy of the lateral segments. Left ventricular diastolic parameters are consistent with Grade I diastolic dysfunction (impaired relaxation). Right Ventricle: The right ventricular size is normal. No increase in right ventricular wall thickness. Right ventricular systolic function is normal. There is normal pulmonary artery systolic pressure. The tricuspid regurgitant velocity is 1.79 m/s, and  with an assumed right atrial pressure of 3 mmHg, the estimated right ventricular systolic pressure is 83.0 mmHg. Left Atrium: Left atrial size was normal in size. Right Atrium: Right atrial size was normal in size. Pericardium: Trivial pericardial effusion is present. Mitral Valve: The mitral valve is grossly normal. No evidence of mitral valve regurgitation. No evidence of mitral valve stenosis. MV peak gradient, 4.0 mmHg. The mean mitral valve gradient is 2.0 mmHg. Tricuspid Valve: The tricuspid valve is not well visualized. Tricuspid valve regurgitation is not demonstrated. No evidence of tricuspid stenosis. Aortic Valve: The aortic valve is tricuspid. Aortic valve regurgitation is not visualized. No aortic stenosis is present. Aortic valve mean gradient measures 5.0 mmHg. Aortic valve peak gradient measures 10.0 mmHg. Aortic valve area, by VTI measures 3.34  cm. Pulmonic Valve: The pulmonic valve was not well visualized. Pulmonic valve regurgitation is trivial. No evidence of pulmonic stenosis. Aorta: The aortic root and ascending aorta are structurally normal, with no evidence of dilitation. Venous: The inferior vena cava is normal in size with greater than 50% respiratory variability, suggesting right atrial pressure of 3 mmHg. IAS/Shunts: No atrial level shunt detected by color flow Doppler.  LEFT VENTRICLE PLAX 2D  LVIDd:         5.00 cm   Diastology LVIDs:         3.30 cm   LV e' medial:   11.20 cm/s LV PW:         1.50 cm   LV E/e' medial: 4.8 LV IVS:        1.20 cm LVOT diam:     2.10 cm LV SV:         93 LV SV Index:   43 LVOT Area:     3.46 cm  RIGHT VENTRICLE RV Basal diam:  3.20 cm RV Mid diam:    2.40 cm RV S prime:     15.60 cm/s TAPSE (M-mode): 2.4 cm LEFT ATRIUM             Index        RIGHT ATRIUM           Index LA diam:        3.90 cm 1.80 cm/m   RA Area:     14.90 cm LA Vol (A2C):   54.0 ml 24.95 ml/m  RA Volume:   34.00 ml  15.71 ml/m LA Vol (A4C):   61.8 ml 28.55 ml/m LA Biplane Vol: 58.3 ml 26.93 ml/m  AORTIC VALVE                    PULMONIC VALVE AV Area (Vmax):    2.83 cm     PV Vmax:       1.13 m/s AV Area (Vmean):   3.12 cm  PV Peak grad:  5.1 mmHg AV Area (VTI):     3.34 cm AV Vmax:           158.00 cm/s AV Vmean:          95.700 cm/s AV VTI:            0.279 m AV Peak Grad:      10.0 mmHg AV Mean Grad:      5.0 mmHg LVOT Vmax:         129.00 cm/s LVOT Vmean:        86.200 cm/s LVOT VTI:          0.269 m LVOT/AV VTI ratio: 0.96  AORTA Ao Root diam: 3.20 cm Ao Asc diam:  2.90 cm MITRAL VALVE               TRICUSPID VALVE MV Area (PHT): 3.65 cm    TR Peak grad:   12.8 mmHg MV Area VTI:   5.23 cm    TR Vmax:        179.00 cm/s MV Peak grad:  4.0 mmHg MV Mean grad:  2.0 mmHg    SHUNTS MV Vmax:       1.00 m/s    Systemic VTI:  0.27 m MV Vmean:      60.2 cm/s   Systemic Diam: 2.10 cm MV Decel Time: 208 msec MV E velocity: 53.70 cm/s MV Jakiera Ehler velocity: 77.90 cm/s MV E/Pierson Vantol ratio:  0.69 Vishnu Priya Mallipeddi Electronically signed by Lorelee Cover Mallipeddi Signature Date/Time: 06/28/2022/1:54:06 PM    Final         Scheduled Meds:  [START ON 06/30/2022] aspirin  81 mg Oral Daily   [START ON 06/30/2022] clopidogrel  75 mg Oral Q breakfast   cyanocobalamin  1,000 mcg Oral Daily   diclofenac Sodium  2 g Topical QID   heparin  5,000 Units Subcutaneous Q8H   metoprolol succinate  25 mg Oral Daily    multivitamin with minerals  1 tablet Oral Daily   pantoprazole  40 mg Oral Daily   rosuvastatin  20 mg Oral Daily   sodium chloride flush  3 mL Intravenous Q12H   sodium chloride flush  3 mL Intravenous Q12H   sodium chloride flush  3 mL Intravenous Q12H   sodium chloride flush  3 mL Intravenous Q12H   sodium chloride flush  3 mL Intravenous Q12H   tetrabenazine  12.5 mg Oral BID   traZODone  300 mg Oral QHS   umeclidinium bromide  1 puff Inhalation Daily   Continuous Infusions:  sodium chloride     sodium chloride 50 mL/hr at 06/29/22 0827   sodium chloride       LOS: 2 days    Time spent: over 30 min    Fayrene Helper, MD [facility]   To contact the attending provider between 7A-7P or the covering provider during after hours 7P-7A, please log into the web site [email or web address] and access using universal  password for that web site. If you do not have the password, please call the hospital operator.  06/29/2022, 5:59 PM

## 2022-06-29 NOTE — H&P (View-Only) (Signed)
Rounding Note    Patient Name: Ashley Patrick Date of Encounter: 06/29/2022  [place] Cardiologist: Chalmers Guest, MD   Subjective   Planning for heart cath today.  Inpatient Medications    Scheduled Meds:  aspirin EC  81 mg Oral Daily   cyanocobalamin  1,000 mcg Oral Daily   heparin  5,000 Units Subcutaneous Q8H   multivitamin with minerals  1 tablet Oral Daily   pantoprazole  40 mg Oral Daily   rosuvastatin  20 mg Oral Daily   sodium chloride flush  3 mL Intravenous Q12H   sodium chloride flush  3 mL Intravenous Q12H   sodium chloride flush  3 mL Intravenous Q12H   sodium chloride flush  3 mL Intravenous Q12H   tetrabenazine  12.5 mg Oral BID   traZODone  300 mg Oral QHS   umeclidinium bromide  1 puff Inhalation Daily   Continuous Infusions:  sodium chloride     sodium chloride     sodium chloride 10 mL/hr at 06/29/22 0637   PRN Meds: sodium chloride, sodium chloride, acetaminophen **OR** acetaminophen, bisacodyl, hydrALAZINE, HYDROmorphone (DILAUDID) injection, ipratropium, levalbuterol, ondansetron **OR** ondansetron (ZOFRAN) IV, oxyCODONE, senna-docusate, sodium chloride flush, sodium phosphate, traZODone   Vital Signs    Vitals:   06/28/22 2110 06/28/22 2111 06/29/22 0130 06/29/22 0300  BP:  (!) 142/97 102/60 (!) 123/98  Pulse:   91 99  Resp:  '20 18 18  '$ Temp: 98.3 F (36.8 C)  98.4 F (36.9 C) 98.2 F (36.8 C)  TempSrc:   Oral Oral  SpO2:  99% 93% 93%  Weight: 106 kg  106.1 kg   Height: '5\' 6"'$  (1.676 m)       Intake/Output Summary (Last 24 hours) at 06/29/2022 0653 Last data filed at 06/29/2022 0415 Gross per 24 hour  Intake --  Output 700 ml  Net -700 ml      06/29/2022    1:30 AM 06/28/2022    9:10 PM 06/27/2022   11:46 AM  Last 3 Weights  Weight (lbs) 233 lb 14.5 oz 233 lb 11 oz 241 lb  Weight (kg) 106.1 kg 106 kg 109.317 kg      Telemetry    Sinus to sinus tachycardia HR 90-120s - Personally Reviewed  ECG    No new  tracings - Personally Reviewed  Physical Exam   GEN: No acute distress.   Neck: No JVD Cardiac: RRR, no murmurs, rubs, or gallops.  Respiratory: Clear to auscultation bilaterally. GI: Soft, nontender, non-distended  MS: No edema; No deformity. Neuro:  Nonfocal  Psych: Normal affect   Labs    High Sensitivity Troponin:   Recent Labs  Lab 06/27/22 1224 06/27/22 1654  TROPONINIHS 3 3     Chemistry Recent Labs  Lab 06/27/22 1229 06/28/22 0456 06/28/22 1346 06/29/22 0429  NA  --  139 138 140  K  --  4.1 3.8 4.2  CL  --  101 104 102  CO2  --  '29 28 29  '$ GLUCOSE  --  108* 144* 114*  BUN  --  21 21 27*  CREATININE  --  1.13* 1.25* 1.29*  CALCIUM  --  9.4 9.7 9.7  MG 1.9  --   --   --   GFRNONAA  --  54* 48* 46*  ANIONGAP  --  '9 6 9    '$ Lipids  Recent Labs  Lab 06/28/22 0456  CHOL 156  TRIG 116  HDL 53  [place] 80  CHOLHDL 2.9    Hematology Recent Labs  Lab 06/27/22 1224 06/28/22 0456  WBC 6.5 6.3  RBC 4.46 4.43  HGB 13.3 13.0  HCT 40.3 39.4  MCV 90.4 88.9  MCH 29.8 29.3  MCHC 33.0 33.0  RDW 14.2 14.1  PLT 198 191   Thyroid  Recent Labs  Lab 06/28/22 0456  TSH 2.735    BNP Recent Labs  Lab 06/27/22 1224 06/28/22 0456  BNP 5.0 4.0    DDimer  Recent Labs  Lab 06/27/22 1224  DDIMER <0.27     Radiology    ECHOCARDIOGRAM COMPLETE  Result Date: 06/28/2022    ECHOCARDIOGRAM REPORT   Patient Name:   Ashley Patrick Date of Exam: 06/28/2022 Medical Rec #:  [id number]      Height:       66.0 in Accession #:    [id number]     Weight:       241.0 lb Date of Birth:  April 30, 1956      BSA:          2.165 m Patient Age:    67 years       BP:           109/67 mmHg Patient Gender: F              HR:           98 bpm. Exam Location:  [facility] Procedure: 2D Echo, Cardiac Doppler and Color Doppler Indications:    CHF  History:        Patient has prior history of Echocardiogram examinations, most                 recent 11/11/2021. CHF, Signs/Symptoms:Shortness of  Breath; Risk                 Factors:Hypertension, Diabetes, Dyslipidemia and Former Smoker.                 Colon CA.  Sonographer:    Wenda Low Referring Phys: [phone] SEYED A SHAHMEHDI  Sonographer Comments: Patient is obese. IMPRESSIONS  1. Left ventricular ejection fraction, by estimation, is 60 to 65%. The left ventricle has normal function. Left ventricular endocardial border not optimally defined to evaluate regional wall motion. There is moderate asymmetric left ventricular hypertrophy of the lateral segment. Left ventricular diastolic parameters are consistent with Grade I diastolic dysfunction (impaired relaxation).  2. Right ventricular systolic function is normal. The right ventricular size is normal. There is normal pulmonary artery systolic pressure. The estimated right ventricular systolic pressure is 40.9 mmHg.  3. The mitral valve is grossly normal. No evidence of mitral valve regurgitation. No evidence of mitral stenosis.  4. The aortic valve is tricuspid. Aortic valve regurgitation is not visualized. No aortic stenosis is present.  5. The inferior vena cava is normal in size with greater than 50% respiratory variability, suggesting right atrial pressure of 3 mmHg. Comparison(s): No significant change from prior study. FINDINGS  Left Ventricle: Left ventricular ejection fraction, by estimation, is 60 to 65%. The left ventricle has normal function. Left ventricular endocardial border not optimally defined to evaluate regional wall motion. The left ventricular internal cavity size was normal in size. There is moderate asymmetric left ventricular hypertrophy of the lateral segments. Left ventricular diastolic parameters are consistent with Grade I diastolic dysfunction (impaired relaxation). Right Ventricle: The right ventricular size is normal. No increase in right ventricular wall thickness. Right ventricular systolic function is normal.  There is normal pulmonary artery systolic pressure. The  tricuspid regurgitant velocity is 1.79 m/s, and  with an assumed right atrial pressure of 3 mmHg, the estimated right ventricular systolic pressure is 16.1 mmHg. Left Atrium: Left atrial size was normal in size. Right Atrium: Right atrial size was normal in size. Pericardium: Trivial pericardial effusion is present. Mitral Valve: The mitral valve is grossly normal. No evidence of mitral valve regurgitation. No evidence of mitral valve stenosis. MV peak gradient, 4.0 mmHg. The mean mitral valve gradient is 2.0 mmHg. Tricuspid Valve: The tricuspid valve is not well visualized. Tricuspid valve regurgitation is not demonstrated. No evidence of tricuspid stenosis. Aortic Valve: The aortic valve is tricuspid. Aortic valve regurgitation is not visualized. No aortic stenosis is present. Aortic valve mean gradient measures 5.0 mmHg. Aortic valve peak gradient measures 10.0 mmHg. Aortic valve area, by VTI measures 3.34  cm. Pulmonic Valve: The pulmonic valve was not well visualized. Pulmonic valve regurgitation is trivial. No evidence of pulmonic stenosis. Aorta: The aortic root and ascending aorta are structurally normal, with no evidence of dilitation. Venous: The inferior vena cava is normal in size with greater than 50% respiratory variability, suggesting right atrial pressure of 3 mmHg. IAS/Shunts: No atrial level shunt detected by color flow Doppler.  LEFT VENTRICLE PLAX 2D LVIDd:         5.00 cm   Diastology LVIDs:         3.30 cm   LV e' medial:   11.20 cm/s LV PW:         1.50 cm   LV E/e' medial: 4.8 LV IVS:        1.20 cm LVOT diam:     2.10 cm LV SV:         93 LV SV Index:   43 LVOT Area:     3.46 cm  RIGHT VENTRICLE RV Basal diam:  3.20 cm RV Mid diam:    2.40 cm RV S prime:     15.60 cm/s TAPSE (M-mode): 2.4 cm LEFT ATRIUM             Index        RIGHT ATRIUM           Index LA diam:        3.90 cm 1.80 cm/m   RA Area:     14.90 cm LA Vol (A2C):   54.0 ml 24.95 ml/m  RA Volume:   34.00 ml  15.71 ml/m LA  Vol (A4C):   61.8 ml 28.55 ml/m LA Biplane Vol: 58.3 ml 26.93 ml/m  AORTIC VALVE                    PULMONIC VALVE AV Area (Vmax):    2.83 cm     PV Vmax:       1.13 m/s AV Area (Vmean):   3.12 cm     PV Peak grad:  5.1 mmHg AV Area (VTI):     3.34 cm AV Vmax:           158.00 cm/s AV Vmean:          95.700 cm/s AV VTI:            0.279 m AV Peak Grad:      10.0 mmHg AV Mean Grad:      5.0 mmHg LVOT Vmax:         129.00 cm/s LVOT Vmean:        86.200  cm/s LVOT VTI:          0.269 m LVOT/AV VTI ratio: 0.96  AORTA Ao Root diam: 3.20 cm Ao Asc diam:  2.90 cm MITRAL VALVE               TRICUSPID VALVE MV Area (PHT): 3.65 cm    TR Peak grad:   12.8 mmHg MV Area VTI:   5.23 cm    TR Vmax:        179.00 cm/s MV Peak grad:  4.0 mmHg MV Mean grad:  2.0 mmHg    SHUNTS MV Vmax:       1.00 m/s    Systemic VTI:  0.27 m MV Vmean:      60.2 cm/s   Systemic Diam: 2.10 cm MV Decel Time: 208 msec MV E velocity: 53.70 cm/s MV A velocity: 77.90 cm/s MV E/A ratio:  0.69 Vishnu Priya Mallipeddi Electronically signed by Lorelee Cover Mallipeddi Signature Date/Time: 06/28/2022/1:54:06 PM    Final    DG Chest 2 View  Result Date: 06/27/2022 CLINICAL DATA:  67 year old female with shortness of breath. Extremity swelling. Has taken some diuretics. EXAM: CHEST - 2 VIEW COMPARISON:  Portable chest 11/07/2021 and earlier. FINDINGS: PA and lateral views at 1207 hours. Lung volumes and mediastinal contours are normal aside from mild chronic tortuosity of the descending thoracic aorta. Visualized tracheal air column is within normal limits. No pneumothorax or pleural effusion. Pulmonary vascularity within normal limits, no edema or confluent pulmonary opacity. No acute osseous abnormality identified. Negative visible bowel gas. IMPRESSION: No acute cardiopulmonary abnormality. Electronically Signed   By: Genevie Ann M.D.   On: 06/27/2022 12:19    Cardiac Studies   Echo 06/28/22:  1. Left ventricular ejection fraction, by estimation, is 60  to 65%. The  left ventricle has normal function. Left ventricular endocardial border  not optimally defined to evaluate regional wall motion. There is moderate  asymmetric left ventricular  hypertrophy of the lateral segment. Left ventricular diastolic parameters  are consistent with Grade I diastolic dysfunction (impaired relaxation).   2. Right ventricular systolic function is normal. The right ventricular  size is normal. There is normal pulmonary artery systolic pressure. The  estimated right ventricular systolic pressure is 46.2 mmHg.   3. The mitral valve is grossly normal. No evidence of mitral valve  regurgitation. No evidence of mitral stenosis.   4. The aortic valve is tricuspid. Aortic valve regurgitation is not  visualized. No aortic stenosis is present.   5. The inferior vena cava is normal in size with greater than 50%  respiratory variability, suggesting right atrial pressure of 3 mmHg.   Patient Profile     67 y.o. female with HTN, HLD, prior longstanding tobacco abuse (recently quit to vaping), chronic HFpEF, anxiety, aortic atherosclerosis, back pain, bipolar disorder, colon CA, depression, migraines, panic attacks, schizophrenia, prior OD who has several month history of exertional dyspnea and chest tightness despite diuresis.   Assessment & Plan    DOE Exertional chest tightness Acute on Chronic HFpEF Volume status difficult on exam given body habitus. She was treated with lasix gtt at [facility] in [facility] today Despite high doses of lasix, she continued to report SOB, prompting lasix gtt Results of cath today will further guide medication selection for GDMT Continue ASA, not on a BB with HR in the 90s - consider adding this today She remains on supplemental O2, not on oxygen at home I was able to position the  bed flat and she did well but still on 2 pillows, may need to wedge  AKI sCr 1.29 (1.25) Was 0.94 on admission Monitor after heart cath - may  need gentle diuresis  Hypertension PTA: 10 mg olmesartan, 5 mg amlodipine,   Hyperlipidemia  Continue crestor Former smoker,  now vaping Will evaluate coronaries today Pt is tearful at the end of our conversation. Her son received PCI with [place] last year, but died 61 months ago from ?CHF. Also, her sister is here on 6E for "heart surgery."   For questions or updates, please contact [facility] Please consult [email or web address] for contact info under    Signed, Ledora Bottcher, PA  06/29/2022, 6:53 AM   As above, patient seen and examined.  She has a cough this morning but states her dyspnea has improved.  There is no chest pain today.  She was transferred from [facility] for right and left cardiac catheterization given progressive dyspnea on exertion and occasional chest tightness.  The risk and benefits include myocardial infarction, CVA and death discussed and she agrees to proceed.  Will hold diuretics for now and await results of right heart catheterization.  Continue aspirin and statin.  Patient with acute kidney injury.  Follow renal function after catheterization.  Will begin low-dose Toprol for blood pressure. Kirk Ruths, MD

## 2022-06-29 NOTE — Interval H&P Note (Signed)
Cath Lab Visit (complete for each Cath Lab visit)  Clinical Evaluation Leading to the Procedure:   ACS: Yes  Non-ACS:    Anginal Classification: CCS IV  Anti-ischemic medical therapy: Minimal Therapy (1 class of medications)  Non-Invasive Test Results: No non-invasive testing performed  Prior CABG: No previous CABG      History and Physical Interval Note:  06/29/2022 10:08 AM  Ashley Patrick  has presented today for surgery, with the diagnosis of shortness of breath.  The various methods of treatment have been discussed with the patient and family. After consideration of risks, benefits and other options for treatment, the patient has consented to  Procedure(s): RIGHT/LEFT HEART CATH AND CORONARY ANGIOGRAPHY (N/A) as a surgical intervention.  The patient's history has been reviewed, patient examined, no change in status, stable for surgery.  I have reviewed the patient's chart and labs.  Questions were answered to the patient's satisfaction.     Larae Grooms

## 2022-06-30 ENCOUNTER — Other Ambulatory Visit (HOSPITAL_COMMUNITY): Payer: Self-pay

## 2022-06-30 ENCOUNTER — Encounter (HOSPITAL_COMMUNITY): Payer: Self-pay | Admitting: Interventional Cardiology

## 2022-06-30 DIAGNOSIS — J9601 Acute respiratory failure with hypoxia: Secondary | ICD-10-CM | POA: Diagnosis not present

## 2022-06-30 DIAGNOSIS — I200 Unstable angina: Secondary | ICD-10-CM

## 2022-06-30 LAB — BASIC METABOLIC PANEL
Anion gap: 10 (ref 5–15)
BUN: 22 mg/dL (ref 8–23)
CO2: 26 mmol/L (ref 22–32)
Calcium: 9.1 mg/dL (ref 8.9–10.3)
Chloride: 104 mmol/L (ref 98–111)
Creatinine, Ser: 1.02 mg/dL — ABNORMAL HIGH (ref 0.44–1.00)
GFR, Estimated: >60 mL/min (ref >60)
Glucose, Bld: 98 mg/dL (ref 70–99)
Potassium: 4 mmol/L (ref 3.5–5.1)
Sodium: 140 mmol/L (ref 135–145)

## 2022-06-30 LAB — CBC
HCT: 37.3 % (ref 36.0–46.0)
Hemoglobin: 12.5 g/dL (ref 12.0–15.0)
MCH: 29.9 pg (ref 26.0–34.0)
MCHC: 33.5 g/dL (ref 30.0–36.0)
MCV: 89.2 fL (ref 80.0–100.0)
Platelets: 196 10*3/uL (ref 150–400)
RBC: 4.18 MIL/uL (ref 3.87–5.11)
RDW: 14.4 % (ref 11.5–15.5)
WBC: 7.3 10*3/uL (ref 4.0–10.5)
nRBC: 0 % (ref 0.0–0.2)

## 2022-06-30 LAB — GLUCOSE, CAPILLARY
Glucose-Capillary: 92 mg/dL (ref 70–99)
Glucose-Capillary: 90 mg/dL (ref 70–99)

## 2022-06-30 LAB — HEMOGLOBIN A1C
Hgb A1c MFr Bld: 5.8 % — ABNORMAL HIGH (ref 4.8–5.6)
Mean Plasma Glucose: 119.76 mg/dL

## 2022-06-30 MED ORDER — AMLODIPINE BESYLATE 5 MG PO TABS
5.0000 mg | ORAL_TABLET | Freq: Every day | ORAL | Status: DC
  Administered 2022-06-30: 5 mg via ORAL
  Filled 2022-06-30: qty 1

## 2022-06-30 MED ORDER — PANTOPRAZOLE SODIUM 40 MG PO TBEC
40.0000 mg | DELAYED_RELEASE_TABLET | Freq: Every day | ORAL | 3 refills | Status: DC
  Filled 2022-06-30: qty 30, 30d supply, fill #0

## 2022-06-30 MED ORDER — CLOPIDOGREL BISULFATE 75 MG PO TABS
75.0000 mg | ORAL_TABLET | Freq: Every day | ORAL | 5 refills | Status: DC
  Filled 2022-06-30: qty 30, 30d supply, fill #0

## 2022-06-30 MED ORDER — METOPROLOL SUCCINATE ER 25 MG PO TB24
25.0000 mg | ORAL_TABLET | Freq: Every day | ORAL | 1 refills | Status: DC
  Filled 2022-06-30: qty 30, 30d supply, fill #0

## 2022-06-30 MED ORDER — ROSUVASTATIN CALCIUM 20 MG PO TABS
40.0000 mg | ORAL_TABLET | Freq: Every day | ORAL | Status: DC
  Administered 2022-06-30: 40 mg via ORAL
  Filled 2022-06-30: qty 2

## 2022-06-30 MED ORDER — ROSUVASTATIN CALCIUM 40 MG PO TABS
40.0000 mg | ORAL_TABLET | Freq: Every day | ORAL | 1 refills | Status: DC
  Filled 2022-06-30: qty 30, 30d supply, fill #0

## 2022-06-30 MED FILL — Verapamil HCl IV Soln 2.5 MG/ML: INTRAVENOUS | Qty: 2 | Status: AC

## 2022-06-30 NOTE — Progress Notes (Signed)
Pt's O2 saturation drops to 86-89 sustaining while asleep.Supplemental O2 initiated at 2L per [place] with O2 sats at 94-97. Will follow up with MD in AM. Incoming RN made aware.

## 2022-06-30 NOTE — Discharge Summary (Addendum)
Physician Discharge Summary  Ashley Patrick:[id number] DOB: [birth year] DOA: 06/27/2022  PCP: Denyce Robert, FNP  Admit date: 06/27/2022 Discharge date: 06/30/2022  Time spent: 40 minutes  Recommendations for Outpatient Follow-up:  Follow outpatient CBC/CMP  Follow with cardiology outpatient Follow LFT's in 8 weeks  Follow volume status outpatient   Discharge Diagnoses:  Principal Problem:   Acute respiratory failure with hypoxia ([place]) Active Problems:   Anxiety, generalized   CHF (congestive heart failure) ([place])   Essential hypertension   DM II (diabetes mellitus, type II), controlled ([place])   OBESITY   History of colon cancer   Gastroesophageal reflux disease   MDD (major depressive disorder), recurrent, severe, with psychosis ([place])   HLD (hyperlipidemia)   (HFpEF) heart failure with preserved ejection fraction (HCC)   Coronary artery disease   Discharge Condition: stable  Diet recommendation: heart healthy, diabetic  Filed Weights   06/28/22 2110 06/29/22 0130 06/30/22 0423  Weight: 106 kg 106.1 kg 106.7 kg    History of present illness:  Ashley Patrick is Ashley Patrick 67 year old female with history of  HFpEF, HTN, HLD, anxiety, bipolar,h/o colon CA, GERD , insomnia who presented from cardiology with progressive SOB.  She was treated with diuresis, due to exertional CP/unstable angina was transferred for [facility], now s/p stent placement.  See below for additional details    Hospital Course:  Assessment and Plan: Acute respiratory failure with hypoxia ([place]) Improved with diuresis Will follow   Acute diastolic CHF   Mild Pulmonary HTN Last echocardiogram 11/11/21 60-65% with mild LVH and grade 1 diastolic dysfunction Cardiology c/s, appreciate recs Diuresis per cards - resume home lasix Echo with EF 95-62%, grade 1 diastolic dysfunction S/p R/LHC with mild pulm HTN, single vessel CAD in circumflex, treated with DES   Coronary Artery Disease s/p DES  Unstable  Angina Single vessel CAD in circumflex, treated with DES, continue DAPT x 6 months with aggressive secondary preventions   Anxiety, generalized Continue home psych meds  DM II (diabetes mellitus, type II), controlled ([place]) -A1c 5.8 -metformin   Essential hypertension Benicar and norvasc resumed at d/c Continue lasix   HLD (hyperlipidemia) Continue rosuvastatin -> dose increased   MDD (major depressive disorder), recurrent, severe, with psychosis ([place]) Stable, continue home medication   Gastroesophageal reflux disease - Continue PPI   History of colon cancer - Follow-up as an outpatient   Superficial Thrombophlebitis - over dorsal aspect of L wrist, conservative management  OBESITY Body mass index is 38.9 kg/m. -Counseled regarding healthy diet, exercise, weight loss program Follow-up with Jennetta Flood PCP with Loida Calamia plan     Procedures: L/RHC Prox RCA to Mid RCA lesion is 25% stenosed.   Mid LAD lesion is 25% stenosed.   Dist Cx lesion is 80% stenosed.   Gabriana Wilmott drug-eluting stent was successfully placed using Cherre Kothari SYNERGY XD 3.50X16, postdilated to greater than 4 mm and optimized with intravascular ultrasound.   Post intervention, there is Alondra Sahni 0% residual stenosis.   LV end diastolic pressure is normal.   Hemodynamic findings consistent with mild pulmonary hypertension.   There is no aortic valve stenosis.   Aortic saturation 89%, PA saturation 65%, mean right atrial pressure 9 mmHg, PA pressure 33/24, mean PA pressure 29 mmHg, mean pulmonary capillary wedge pressure 15 mmHg, cardiac output 6.7 L/min, cardiac index 3.13.   Mild pulmonary hypertension. Single-vessel coronary artery disease in the circumflex, successfully treated with Skyelar Halliday 3.5 x 16 Synergy drug-eluting stent postdilated to greater than 4 mm. Continue dual  antiplatelet therapy for 6 months along with aggressive secondary prevention.  Echo IMPRESSIONS     1. Left ventricular ejection fraction, by estimation, is 60 to 65%.  The  left ventricle has normal function. Left ventricular endocardial border  not optimally defined to evaluate regional wall motion. There is moderate  asymmetric left ventricular  hypertrophy of the lateral segment. Left ventricular diastolic parameters  are consistent with Grade I diastolic dysfunction (impaired relaxation).   2. Right ventricular systolic function is normal. The right ventricular  size is normal. There is normal pulmonary artery systolic pressure. The  estimated right ventricular systolic pressure is 95.6 mmHg.   3. The mitral valve is grossly normal. No evidence of mitral valve  regurgitation. No evidence of mitral stenosis.   4. The aortic valve is tricuspid. Aortic valve regurgitation is not  visualized. No aortic stenosis is present.   5. The inferior vena cava is normal in size with greater than 50%  respiratory variability, suggesting right atrial pressure of 3 mmHg.   Comparison(s): No significant change from prior study.   RLE US IMPRESSION: No right lower extremity DVT  Consultations: cardiology  Discharge Exam: Vitals:   06/30/22 1137 06/30/22 1139  BP: 135/75 135/75  Pulse: 90 (!) 102  Resp: 20   Temp: 99 F (37.2 C)   SpO2: 96% 92%   Feels well, eager to discharge  General: No acute distress. Cardiovascular: RRR Lungs: unlabored, CTAB Abdomen: Soft, nontender, nondistended  Neurological: Alert and oriented 3. Moves all extremities 4. Cranial nerves II through XII grossly intact. Extremities: trace edema.   Discharge Instructions   Discharge Instructions     (HEART FAILURE PATIENTS) Call MD:  Anytime you have any of the following symptoms: 1) 3 pound weight gain in 24 hours or 5 pounds in 1 week 2) shortness of breath, with or without Erendida Wrenn dry hacking cough 3) swelling in the hands, feet or stomach 4) if you have to sleep on extra pillows at night in order to breathe.   Complete by: As directed    Activity as tolerated - No  restrictions   Complete by: As directed    Amb Referral to Cardiac Rehabilitation   Complete by: As directed    Diagnosis: Coronary Stents   After initial evaluation and assessments completed: Virtual Based Care may be provided alone or in conjunction with Phase 2 Cardiac Rehab based on patient barriers.: Yes   Intensive Cardiac Rehabilitation (ICR) [place] location only OR Traditional Cardiac Rehabilitation (TCR) *If criteria for ICR are not met will enroll in TCR [facility] only): Yes   Call MD for:  difficulty breathing, headache or visual disturbances   Complete by: As directed    Call MD for:  extreme fatigue   Complete by: As directed    Call MD for:  hives   Complete by: As directed    Call MD for:  persistant dizziness or light-headedness   Complete by: As directed    Call MD for:  persistant nausea and vomiting   Complete by: As directed    Call MD for:  redness, tenderness, or signs of infection (pain, swelling, redness, odor or green/yellow discharge around incision site)   Complete by: As directed    Call MD for:  severe uncontrolled pain   Complete by: As directed    Call MD for:  temperature >100.4   Complete by: As directed    Diet - low sodium heart healthy   Complete by: As directed  Diet - low sodium heart healthy   Complete by: As directed    Discharge instructions   Complete by: As directed    You were seen with Milanie Rosenfield heart failure exacerbation.  You were also noted to have unstable angina.  You had Dandra Shambaugh catheterization which showed coronary artery disease which was treated with Tahjay Binion stent.  Continue aspirin and plavix, these will need to be continued for at least 6 months without any interruption.  We've also started you on metoprolol.  We increased your crestor to 40 mg.  Continue your lasix as previously prescribed.  You should have repeat labs within 1 week.  You'll need repeat liver labs within 8 weeks.  Cardiology will arrange outpatient follow up with you.  Return for  new, recurrent, or worsening symptoms.  Please ask your PCP to request records from this hospitalization so they know what was done and what the next steps will be.   Increase activity slowly   Complete by: As directed    Increase activity slowly   Complete by: As directed       Allergies as of 06/30/2022   No Known Allergies      Medication List     STOP taking these medications    celecoxib 100 MG capsule Commonly known as: CELEBREX   Latuda 20 MG Tabs tablet Generic drug: lurasidone   omeprazole 20 MG capsule Commonly known as: PRILOSEC Replaced by: pantoprazole 40 MG tablet       TAKE these medications    acetaminophen 650 MG CR tablet Commonly known as: TYLENOL Take 650 mg by mouth every 8 (eight) hours as needed for pain.   albuterol 108 (90 Base) MCG/ACT inhaler Commonly known as: VENTOLIN HFA Inhale 2 puffs into the lungs every 4 (four) hours as needed for wheezing or shortness of breath.   Allergy Relief 10 MG tablet Generic drug: loratadine Take 10 mg by mouth daily.   amLODipine 5 MG tablet Commonly known as: NORVASC Take 5 mg by mouth daily.   aspirin EC 81 MG tablet Take 81 mg by mouth daily. Swallow whole.   cariprazine 1.5 MG capsule Commonly known as: Vraylar Take 1 capsule (1.5 mg total) by mouth daily. Start taking on: August 10, 2022   clopidogrel 75 MG tablet Commonly known as: PLAVIX Take 1 tablet (75 mg total) by mouth daily with breakfast. Start taking on: July 01, 2022   cyanocobalamin 1000 MCG tablet Commonly known as: VITAMIN B12 Take 1,000 mcg by mouth daily.   fluorometholone 0.1 % ophthalmic suspension Commonly known as: FML Place 2 drops into both eyes 2 (two) times daily.   fluticasone 50 MCG/ACT nasal spray Commonly known as: FLONASE Place 2 sprays into both nostrils daily.   furosemide 80 MG tablet Commonly known as: LASIX Take 1 tablet (80 mg total) by mouth in the morning. & 80 mg at 5:00 pm    metFORMIN 500 MG tablet Commonly known as: GLUCOPHAGE Take 1 tablet (500 mg total) by mouth every evening. Start taking on: July 13, 2022 What changed: when to take this   metoprolol succinate 25 MG 24 hr tablet Commonly known as: TOPROL-XL Take 1 tablet (25 mg total) by mouth daily. Start taking on: July 01, 2022   multivitamin with minerals tablet Take 1 tablet by mouth daily.   olmesartan 5 MG tablet Commonly known as: BENICAR Take 2 tablets (10 mg total) by mouth daily. What changed: how much to take   pantoprazole 40 MG  tablet Commonly known as: PROTONIX Take 1 tablet (40 mg total) by mouth daily. Start taking on: July 01, 2022 Replaces: omeprazole 20 MG capsule   potassium chloride SA 20 MEQ tablet Commonly known as: KLOR-CON M Take 1 tablet (20 mEq total) by mouth daily.   rosuvastatin 40 MG tablet Commonly known as: CRESTOR Take 1 tablet (40 mg total) by mouth daily. Start taking on: July 01, 2022 What changed:  medication strength how much to take   Spiriva Respimat 1.25 MCG/ACT Aers Generic drug: Tiotropium Bromide Monohydrate Inhale 2 puffs into the lungs daily.   tetrabenazine 12.5 MG tablet Commonly known as: XENAZINE Take 1 tablet (12.5 mg total) by mouth 2 (two) times daily.   traZODone 150 MG tablet Commonly known as: DESYREL Take 300 mg by mouth at bedtime.   zaleplon 5 MG capsule Commonly known as: SONATA Take 1 capsule (5 mg total) by mouth at bedtime as needed for sleep.       No Known Allergies    The results of significant diagnostics from this hospitalization (including imaging, microbiology, ancillary and laboratory) are listed below for reference.    Significant Diagnostic Studies: CARDIAC CATHETERIZATION  Result Date: 06/29/2022   Prox RCA to Mid RCA lesion is 25% stenosed.   Mid LAD lesion is 25% stenosed.   Dist Cx lesion is 80% stenosed.   Kerrie Latour drug-eluting stent was successfully placed using Faatima Tench SYNERGY XD  3.50X16, postdilated to greater than 4 mm and optimized with intravascular ultrasound.   Post intervention, there is Erie Sica 0% residual stenosis.   LV end diastolic pressure is normal.   Hemodynamic findings consistent with mild pulmonary hypertension.   There is no aortic valve stenosis.   Aortic saturation 89%, PA saturation 65%, mean right atrial pressure 9 mmHg, PA pressure 33/24, mean PA pressure 29 mmHg, mean pulmonary capillary wedge pressure 15 mmHg, cardiac output 6.7 L/min, cardiac index 3.13. Mild pulmonary hypertension. Single-vessel coronary artery disease in the circumflex, successfully treated with Oni Dietzman 3.5 x 16 Synergy drug-eluting stent postdilated to greater than 4 mm. Continue dual antiplatelet therapy for 6 months along with aggressive secondary prevention.   ECHOCARDIOGRAM COMPLETE  Result Date: 06/28/2022    ECHOCARDIOGRAM REPORT   Patient Name:   Ashley Patrick Shirah Date of Exam: 06/28/2022 Medical Rec #:  [id number]      Height:       66.0 in Accession #:    [id number]     Weight:       241.0 lb Date of Birth:  12-Feb-1956      BSA:          2.165 m Patient Age:    89 years       BP:           109/67 mmHg Patient Gender: F              HR:           98 bpm. Exam Location:  [facility] Procedure: 2D Echo, Cardiac Doppler and Color Doppler Indications:    CHF  History:        Patient has prior history of Echocardiogram examinations, most                 recent 11/11/2021. CHF, Signs/Symptoms:Shortness of Breath; Risk                 Factors:Hypertension, Diabetes, Dyslipidemia and Former Smoker.  Colon CA.  Sonographer:    Wenda Low Referring Phys: [phone] SEYED Shatina Streets SHAHMEHDI  Sonographer Comments: Patient is obese. IMPRESSIONS  1. Left ventricular ejection fraction, by estimation, is 60 to 65%. The left ventricle has normal function. Left ventricular endocardial border not optimally defined to evaluate regional wall motion. There is moderate asymmetric left ventricular hypertrophy of the  lateral segment. Left ventricular diastolic parameters are consistent with Grade I diastolic dysfunction (impaired relaxation).  2. Right ventricular systolic function is normal. The right ventricular size is normal. There is normal pulmonary artery systolic pressure. The estimated right ventricular systolic pressure is 69.7 mmHg.  3. The mitral valve is grossly normal. No evidence of mitral valve regurgitation. No evidence of mitral stenosis.  4. The aortic valve is tricuspid. Aortic valve regurgitation is not visualized. No aortic stenosis is present.  5. The inferior vena cava is normal in size with greater than 50% respiratory variability, suggesting right atrial pressure of 3 mmHg. Comparison(s): No significant change from prior study. FINDINGS  Left Ventricle: Left ventricular ejection fraction, by estimation, is 60 to 65%. The left ventricle has normal function. Left ventricular endocardial border not optimally defined to evaluate regional wall motion. The left ventricular internal cavity size was normal in size. There is moderate asymmetric left ventricular hypertrophy of the lateral segments. Left ventricular diastolic parameters are consistent with Grade I diastolic dysfunction (impaired relaxation). Right Ventricle: The right ventricular size is normal. No increase in right ventricular wall thickness. Right ventricular systolic function is normal. There is normal pulmonary artery systolic pressure. The tricuspid regurgitant velocity is 1.79 m/s, and  with an assumed right atrial pressure of 3 mmHg, the estimated right ventricular systolic pressure is 94.8 mmHg. Left Atrium: Left atrial size was normal in size. Right Atrium: Right atrial size was normal in size. Pericardium: Trivial pericardial effusion is present. Mitral Valve: The mitral valve is grossly normal. No evidence of mitral valve regurgitation. No evidence of mitral valve stenosis. MV peak gradient, 4.0 mmHg. The mean mitral valve gradient is  2.0 mmHg. Tricuspid Valve: The tricuspid valve is not well visualized. Tricuspid valve regurgitation is not demonstrated. No evidence of tricuspid stenosis. Aortic Valve: The aortic valve is tricuspid. Aortic valve regurgitation is not visualized. No aortic stenosis is present. Aortic valve mean gradient measures 5.0 mmHg. Aortic valve peak gradient measures 10.0 mmHg. Aortic valve area, by VTI measures 3.34  cm. Pulmonic Valve: The pulmonic valve was not well visualized. Pulmonic valve regurgitation is trivial. No evidence of pulmonic stenosis. Aorta: The aortic root and ascending aorta are structurally normal, with no evidence of dilitation. Venous: The inferior vena cava is normal in size with greater than 50% respiratory variability, suggesting right atrial pressure of 3 mmHg. IAS/Shunts: No atrial level shunt detected by color flow Doppler.  LEFT VENTRICLE PLAX 2D LVIDd:         5.00 cm   Diastology LVIDs:         3.30 cm   LV e' medial:   11.20 cm/s LV PW:         1.50 cm   LV E/e' medial: 4.8 LV IVS:        1.20 cm LVOT diam:     2.10 cm LV SV:         93 LV SV Index:   43 LVOT Area:     3.46 cm  RIGHT VENTRICLE RV Basal diam:  3.20 cm RV Mid diam:    2.40 cm RV S  prime:     15.60 cm/s TAPSE (M-mode): 2.4 cm LEFT ATRIUM             Index        RIGHT ATRIUM           Index LA diam:        3.90 cm 1.80 cm/m   RA Area:     14.90 cm LA Vol (A2C):   54.0 ml 24.95 ml/m  RA Volume:   34.00 ml  15.71 ml/m LA Vol (A4C):   61.8 ml 28.55 ml/m LA Biplane Vol: 58.3 ml 26.93 ml/m  AORTIC VALVE                    PULMONIC VALVE AV Area (Vmax):    2.83 cm     PV Vmax:       1.13 m/s AV Area (Vmean):   3.12 cm     PV Peak grad:  5.1 mmHg AV Area (VTI):     3.34 cm AV Vmax:           158.00 cm/s AV Vmean:          95.700 cm/s AV VTI:            0.279 m AV Peak Grad:      10.0 mmHg AV Mean Grad:      5.0 mmHg LVOT Vmax:         129.00 cm/s LVOT Vmean:        86.200 cm/s LVOT VTI:          0.269 m LVOT/AV VTI ratio:  0.96  AORTA Ao Root diam: 3.20 cm Ao Asc diam:  2.90 cm MITRAL VALVE               TRICUSPID VALVE MV Area (PHT): 3.65 cm    TR Peak grad:   12.8 mmHg MV Area VTI:   5.23 cm    TR Vmax:        179.00 cm/s MV Peak grad:  4.0 mmHg MV Mean grad:  2.0 mmHg    SHUNTS MV Vmax:       1.00 m/s    Systemic VTI:  0.27 m MV Vmean:      60.2 cm/s   Systemic Diam: 2.10 cm MV Decel Time: 208 msec MV E velocity: 53.70 cm/s MV Oriana Horiuchi velocity: 77.90 cm/s MV E/Vallie Fayette ratio:  0.69 Vishnu Priya Mallipeddi Electronically signed by Lorelee Cover Mallipeddi Signature Date/Time: 06/28/2022/1:54:06 PM    Final    DG Chest 2 View  Result Date: 06/27/2022 CLINICAL DATA:  67 year old female with shortness of breath. Extremity swelling. Has taken some diuretics. EXAM: CHEST - 2 VIEW COMPARISON:  Portable chest 11/07/2021 and earlier. FINDINGS: PA and lateral views at 1207 hours. Lung volumes and mediastinal contours are normal aside from mild chronic tortuosity of the descending thoracic aorta. Visualized tracheal air column is within normal limits. No pneumothorax or pleural effusion. Pulmonary vascularity within normal limits, no edema or confluent pulmonary opacity. No acute osseous abnormality identified. Negative visible bowel gas. IMPRESSION: No acute cardiopulmonary abnormality. Electronically Signed   By: Genevie Ann M.D.   On: 06/27/2022 12:19   US Venous Img Lower Unilateral Right (DVT)  Result Date: 06/23/2022 CLINICAL DATA:  Right lower extremity pain and swelling for 2 weeks EXAM: RIGHT LOWER EXTREMITY VENOUS DOPPLER ULTRASOUND TECHNIQUE: Gray-scale sonography with compression, as well as color and duplex ultrasound, were performed to evaluate the deep venous system(s) from  the level of the common femoral vein through the popliteal and proximal calf veins. COMPARISON:  None available FINDINGS: VENOUS Normal compressibility of the common femoral, superficial femoral, and popliteal veins, as well as the visualized calf veins. Visualized  portions of profunda femoral vein and great saphenous vein unremarkable. No filling defects to suggest DVT on grayscale or color Doppler imaging. Doppler waveforms show normal direction of venous flow, normal respiratory plasticity and response to augmentation. Limited views of the contralateral common femoral vein are unremarkable. OTHER None. Limitations: none IMPRESSION: No right lower extremity DVT Electronically Signed   By: Miachel Roux M.D.   On: 06/23/2022 15:50    Microbiology: Recent Results (from the past 240 hour(s))  Resp panel by RT-PCR (RSV, Flu Betzabe Bevans&B, Covid) Anterior Nasal Swab     Status: None   Collection Time: 06/27/22  1:03 PM   Specimen: Anterior Nasal Swab  Result Value Ref Range Status   SARS Coronavirus 2 by RT PCR NEGATIVE NEGATIVE Final    Comment: (NOTE) SARS-CoV-2 target nucleic acids are NOT DETECTED.  The SARS-CoV-2 RNA is generally detectable in upper respiratory specimens during the acute phase of infection. The lowest concentration of SARS-CoV-2 viral copies this assay can detect is 138 copies/mL. Nalah Macioce negative result does not preclude SARS-Cov-2 infection and should not be used as the sole basis for treatment or other patient management decisions. Dorr Perrot negative result may occur with  improper specimen collection/handling, submission of specimen other than nasopharyngeal swab, presence of viral mutation(s) within the areas targeted by this assay, and inadequate number of viral copies(<138 copies/mL). Shajuan Musso negative result must be combined with clinical observations, patient history, and epidemiological information. The expected result is Negative.  Fact Sheet for Patients:  EntrepreneurPulse.com.au  Fact Sheet for Healthcare Providers:  IncredibleEmployment.be  This test is no t yet approved or cleared by the [place] FDA and  has been authorized for detection and/or diagnosis of SARS-CoV-2 by FDA under an Emergency Use  Authorization (EUA). This EUA will remain  in effect (meaning this test can be used) for the duration of the COVID-19 declaration under Section 564(b)(1) of the Act, 21 U.S.C.section 360bbb-3(b)(1), unless the authorization is terminated  or revoked sooner.       Influenza Jalesia Loudenslager by PCR NEGATIVE NEGATIVE Final   Influenza B by PCR NEGATIVE NEGATIVE Final    Comment: (NOTE) The Xpert Xpress SARS-CoV-2/FLU/RSV plus assay is intended as an aid in the diagnosis of influenza from Nasopharyngeal swab specimens and should not be used as Dylan Ruotolo sole basis for treatment. Nasal washings and aspirates are unacceptable for Xpert Xpress SARS-CoV-2/FLU/RSV testing.  Fact Sheet for Patients: EntrepreneurPulse.com.au  Fact Sheet for Healthcare Providers: IncredibleEmployment.be  This test is not yet approved or cleared by the [place] FDA and has been authorized for detection and/or diagnosis of SARS-CoV-2 by FDA under an Emergency Use Authorization (EUA). This EUA will remain in effect (meaning this test can be used) for the duration of the COVID-19 declaration under Section 564(b)(1) of the Act, 21 U.S.C. section 360bbb-3(b)(1), unless the authorization is terminated or revoked.     Resp Syncytial Virus by PCR NEGATIVE NEGATIVE Final    Comment: (NOTE) Fact Sheet for Patients: EntrepreneurPulse.com.au  Fact Sheet for Healthcare Providers: IncredibleEmployment.be  This test is not yet approved or cleared by the [place] FDA and has been authorized for detection and/or diagnosis of SARS-CoV-2 by FDA under an Emergency Use Authorization (EUA). This EUA will remain in effect (meaning this test  can be used) for the duration of the COVID-19 declaration under Section 564(b)(1) of the Act, 21 U.S.C. section 360bbb-3(b)(1), unless the authorization is terminated or revoked.  Performed at [facility], [address],  [place], [place] [address]      Labs: Basic Metabolic Panel: Recent Labs  Lab 06/27/22 1224 06/27/22 1229 06/28/22 0456 06/28/22 1346 06/29/22 0429 06/29/22 1026 06/29/22 1034 06/30/22 0232  NA 139  --  139 138 140 140 140  140 140  K 3.6  --  4.1 3.8 4.2 4.0 4.2  4.2 4.0  CL 102  --  101 104 102  --   --  104  CO2 29  --  '29 28 29  '$ --   --  26  GLUCOSE 69*  --  108* 144* 114*  --   --  98  BUN 16  --  21 21 27*  --   --  22  CREATININE 0.94  --  1.13* 1.25* 1.29*  --   --  1.02*  CALCIUM 9.5  --  9.4 9.7 9.7  --   --  9.1  MG  --  1.9  --   --   --   --   --   --   PHOS  --  2.7  --   --   --   --   --   --    Liver Function Tests: No results for input(s): "AST", "ALT", "ALKPHOS", "BILITOT", "PROT", "ALBUMIN" in the last 168 hours. No results for input(s): "LIPASE", "AMYLASE" in the last 168 hours. No results for input(s): "AMMONIA" in the last 168 hours. CBC: Recent Labs  Lab 06/27/22 1224 06/28/22 0456 06/29/22 1026 06/29/22 1034 06/30/22 0232  WBC 6.5 6.3  --   --  7.3  HGB 13.3 13.0 12.9 13.3  13.3 12.5  HCT 40.3 39.4 38.0 39.0  39.0 37.3  MCV 90.4 88.9  --   --  89.2  PLT 198 191  --   --  196   Cardiac Enzymes: No results for input(s): "CKTOTAL", "CKMB", "CKMBINDEX", "TROPONINI" in the last 168 hours. BNP: BNP (last 3 results) Recent Labs    06/27/22 1224 06/28/22 0456  BNP 5.0 4.0    ProBNP (last 3 results) No results for input(s): "PROBNP" in the last 8760 hours.  CBG: Recent Labs  Lab 06/28/22 0815 06/30/22 0747 06/30/22 1146  GLUCAP 116* 92 90       Signed:  Fayrene Helper MD.  [facility] 06/30/2022, 2:32 PM

## 2022-06-30 NOTE — Progress Notes (Signed)
Heart Failure Navigator Progress Note  Assessed for Heart & Vascular TOC clinic readiness.  Patient EF 60-65 %, has close [facility] hospital follow up appointment on 07/11/22..   Navigator will sign off at this time.   Earnestine Leys, BSN, Clinical cytogeneticist Only

## 2022-06-30 NOTE — Care Management Important Message (Signed)
Important Message  Patient Details  Name: Ashley Patrick MRN: [id number] Date of Birth: 02-25-1956   Medicare Important Message Given:  Yes     Shelda Altes 06/30/2022, 11:03 AM

## 2022-06-30 NOTE — Progress Notes (Signed)
CARDIAC REHAB PHASE I   PRE:  Rate/Rhythm: 96 NSR  BP:  Sitting: 135/82      SaO2: 96 RA  MODE:  Ambulation: 240 ft   AD:  None  POST:  Rate/Rhythm: 105 ST  BP:  Sitting: 132/92      SaO2: 97 RA  Pt amb with standby assistance, pt denies CP and SOB during amb and was returned to room w/o complaint.   Post-ambulation RPE 8 (scale 6-20) Pt walked well.   Pt was educated on stent location, Plavix and ASA use, wt restrictions, no baths/daily wash-ups, s/s of infection, ex guidelines (progressive walking), s/s to stop exercising, NTG use and calling 911, heart healthy  and low na diet, risk factors (vaping and HTN), and CRPII. Pt received  materials on exercise, diet, and smoking cessation) Will refer to AP.    Pt currently walks 1x/week for about 15-20 minutes. Encouraged more walking and reducing fried foods.  Christen Bame  8:49 AM 06/30/2022    Service time is from 0800 to 0855.

## 2022-06-30 NOTE — Progress Notes (Signed)
Rounding Note    Patient Name: Ashley Patrick Date of Encounter: 06/30/2022  [place] Cardiologist: Chalmers Guest, MD   Subjective   No CP or dyspnea; some pain on the dorsal aspect of left hand from IV.  Inpatient Medications    Scheduled Meds:  aspirin  81 mg Oral Daily   clopidogrel  75 mg Oral Q breakfast   cyanocobalamin  1,000 mcg Oral Daily   diclofenac Sodium  2 g Topical QID   heparin  5,000 Units Subcutaneous Q8H   metoprolol succinate  25 mg Oral Daily   multivitamin with minerals  1 tablet Oral Daily   pantoprazole  40 mg Oral Daily   rosuvastatin  20 mg Oral Daily   sodium chloride flush  3 mL Intravenous Q12H   sodium chloride flush  3 mL Intravenous Q12H   sodium chloride flush  3 mL Intravenous Q12H   sodium chloride flush  3 mL Intravenous Q12H   sodium chloride flush  3 mL Intravenous Q12H   tetrabenazine  12.5 mg Oral BID   traZODone  300 mg Oral QHS   umeclidinium bromide  1 puff Inhalation Daily   Continuous Infusions:  sodium chloride     sodium chloride 50 mL/hr at 06/29/22 0827   sodium chloride     PRN Meds: sodium chloride, sodium chloride, acetaminophen **OR** acetaminophen, acetaminophen, acetaminophen, ALPRAZolam, bisacodyl, hydrALAZINE, HYDROmorphone (DILAUDID) injection, ipratropium, levalbuterol, ondansetron **OR** ondansetron (ZOFRAN) IV, ondansetron (ZOFRAN) IV, oxyCODONE, senna-docusate, sodium chloride flush, sodium phosphate, traZODone   Vital Signs    Vitals:   06/30/22 0041 06/30/22 0423 06/30/22 0630 06/30/22 0741  BP: (!) 149/81 (!) 145/84    Pulse: 98 96  (P) 98  Resp: 18 19  (P) 18  Temp: 98.2 F (36.8 C) 99 F (37.2 C)  (P) 98.1 F (36.7 C)  TempSrc: Oral Oral  (P) Oral  SpO2: 94% 97% 96% (P) 98%  Weight:  106.7 kg    Height:        Intake/Output Summary (Last 24 hours) at 06/30/2022 0804 Last data filed at 06/29/2022 2112 Gross per 24 hour  Intake 240 ml  Output --  Net 240 ml       06/30/2022    4:23 AM 06/29/2022    1:30 AM 06/28/2022    9:10 PM  Last 3 Weights  Weight (lbs) 235 lb 3.7 oz 233 lb 14.5 oz 233 lb 11 oz  Weight (kg) 106.7 kg 106.1 kg 106 kg      Telemetry    Sinus - Personally Reviewed    GEN: No acute distress.   Neck: No JVD Cardiac: RRR, no murmurs, rubs, or gallops.  Respiratory: Clear to auscultation bilaterally. GI: Soft, nontender, non-distended  MS: No edema; Radial cath site with no hematoma; small tender area dorsal aspect of left hand from IV. Neuro:  Nonfocal  Psych: Normal affect   Labs    High Sensitivity Troponin:   Recent Labs  Lab 06/27/22 1224 06/27/22 1654  TROPONINIHS 3 3     Chemistry Recent Labs  Lab 06/27/22 1229 06/28/22 0456 06/28/22 1346 06/29/22 0429 06/29/22 1026 06/29/22 1034 06/30/22 0232  NA  --    < > 138 140 140 140  140 140  K  --    < > 3.8 4.2 4.0 4.2  4.2 4.0  CL  --    < > 104 102  --   --  104  CO2  --    < >  28 29  --   --  26  GLUCOSE  --    < > 144* 114*  --   --  98  BUN  --    < > 21 27*  --   --  22  CREATININE  --    < > 1.25* 1.29*  --   --  1.02*  CALCIUM  --    < > 9.7 9.7  --   --  9.1  MG 1.9  --   --   --   --   --   --   GFRNONAA  --    < > 48* 46*  --   --  >60  ANIONGAP  --    < > 6 9  --   --  10   < > = values in this interval not displayed.    Lipids  Recent Labs  Lab 06/28/22 0456  CHOL 156  TRIG 116  HDL 53  LDLCALC 80  CHOLHDL 2.9    Hematology Recent Labs  Lab 06/27/22 1224 06/28/22 0456 06/29/22 1026 06/29/22 1034 06/30/22 0232  WBC 6.5 6.3  --   --  7.3  RBC 4.46 4.43  --   --  4.18  HGB 13.3 13.0 12.9 13.3  13.3 12.5  HCT 40.3 39.4 38.0 39.0  39.0 37.3  MCV 90.4 88.9  --   --  89.2  MCH 29.8 29.3  --   --  29.9  MCHC 33.0 33.0  --   --  33.5  RDW 14.2 14.1  --   --  14.4  PLT 198 191  --   --  196   Thyroid  Recent Labs  Lab 06/28/22 0456  TSH 2.735    BNP Recent Labs  Lab 06/27/22 1224 06/28/22 0456  BNP 5.0 4.0     DDimer  Recent Labs  Lab 06/27/22 1224  DDIMER <0.27     Radiology    CARDIAC CATHETERIZATION  Result Date: 06/29/2022   Prox RCA to Mid RCA lesion is 25% stenosed.   Mid LAD lesion is 25% stenosed.   Dist Cx lesion is 80% stenosed.   A drug-eluting stent was successfully placed using a SYNERGY XD 3.50X16, postdilated to greater than 4 mm and optimized with intravascular ultrasound.   Post intervention, there is a 0% residual stenosis.   LV end diastolic pressure is normal.   Hemodynamic findings consistent with mild pulmonary hypertension.   There is no aortic valve stenosis.   Aortic saturation 89%, PA saturation 65%, mean right atrial pressure 9 mmHg, PA pressure 33/24, mean PA pressure 29 mmHg, mean pulmonary capillary wedge pressure 15 mmHg, cardiac output 6.7 L/min, cardiac index 3.13. Mild pulmonary hypertension. Single-vessel coronary artery disease in the circumflex, successfully treated with a 3.5 x 16 Synergy drug-eluting stent postdilated to greater than 4 mm. Continue dual antiplatelet therapy for 6 months along with aggressive secondary prevention.   ECHOCARDIOGRAM COMPLETE  Result Date: 06/28/2022    ECHOCARDIOGRAM REPORT   Patient Name:   Ashley Patrick Date of Exam: 06/28/2022 Medical Rec #:  [id number]      Height:       66.0 in Accession #:    [id number]     Weight:       241.0 lb Date of Birth:  1956-04-27      BSA:          2.165 m Patient Age:    67 years  BP:           109/67 mmHg Patient Gender: F              HR:           98 bpm. Exam Location:  [facility] Procedure: 2D Echo, Cardiac Doppler and Color Doppler Indications:    CHF  History:        Patient has prior history of Echocardiogram examinations, most                 recent 11/11/2021. CHF, Signs/Symptoms:Shortness of Breath; Risk                 Factors:Hypertension, Diabetes, Dyslipidemia and Former Smoker.                 Colon CA.  Sonographer:    Wenda Low Referring Phys: [phone] SEYED A SHAHMEHDI   Sonographer Comments: Patient is obese. IMPRESSIONS  1. Left ventricular ejection fraction, by estimation, is 60 to 65%. The left ventricle has normal function. Left ventricular endocardial border not optimally defined to evaluate regional wall motion. There is moderate asymmetric left ventricular hypertrophy of the lateral segment. Left ventricular diastolic parameters are consistent with Grade I diastolic dysfunction (impaired relaxation).  2. Right ventricular systolic function is normal. The right ventricular size is normal. There is normal pulmonary artery systolic pressure. The estimated right ventricular systolic pressure is 66.5 mmHg.  3. The mitral valve is grossly normal. No evidence of mitral valve regurgitation. No evidence of mitral stenosis.  4. The aortic valve is tricuspid. Aortic valve regurgitation is not visualized. No aortic stenosis is present.  5. The inferior vena cava is normal in size with greater than 50% respiratory variability, suggesting right atrial pressure of 3 mmHg. Comparison(s): No significant change from prior study. FINDINGS  Left Ventricle: Left ventricular ejection fraction, by estimation, is 60 to 65%. The left ventricle has normal function. Left ventricular endocardial border not optimally defined to evaluate regional wall motion. The left ventricular internal cavity size was normal in size. There is moderate asymmetric left ventricular hypertrophy of the lateral segments. Left ventricular diastolic parameters are consistent with Grade I diastolic dysfunction (impaired relaxation). Right Ventricle: The right ventricular size is normal. No increase in right ventricular wall thickness. Right ventricular systolic function is normal. There is normal pulmonary artery systolic pressure. The tricuspid regurgitant velocity is 1.79 m/s, and  with an assumed right atrial pressure of 3 mmHg, the estimated right ventricular systolic pressure is 99.3 mmHg. Left Atrium: Left atrial size  was normal in size. Right Atrium: Right atrial size was normal in size. Pericardium: Trivial pericardial effusion is present. Mitral Valve: The mitral valve is grossly normal. No evidence of mitral valve regurgitation. No evidence of mitral valve stenosis. MV peak gradient, 4.0 mmHg. The mean mitral valve gradient is 2.0 mmHg. Tricuspid Valve: The tricuspid valve is not well visualized. Tricuspid valve regurgitation is not demonstrated. No evidence of tricuspid stenosis. Aortic Valve: The aortic valve is tricuspid. Aortic valve regurgitation is not visualized. No aortic stenosis is present. Aortic valve mean gradient measures 5.0 mmHg. Aortic valve peak gradient measures 10.0 mmHg. Aortic valve area, by VTI measures 3.34  cm. Pulmonic Valve: The pulmonic valve was not well visualized. Pulmonic valve regurgitation is trivial. No evidence of pulmonic stenosis. Aorta: The aortic root and ascending aorta are structurally normal, with no evidence of dilitation. Venous: The inferior vena cava is normal in size with greater than 50% respiratory  variability, suggesting right atrial pressure of 3 mmHg. IAS/Shunts: No atrial level shunt detected by color flow Doppler.  LEFT VENTRICLE PLAX 2D LVIDd:         5.00 cm   Diastology LVIDs:         3.30 cm   LV e' medial:   11.20 cm/s LV PW:         1.50 cm   LV E/e' medial: 4.8 LV IVS:        1.20 cm LVOT diam:     2.10 cm LV SV:         93 LV SV Index:   43 LVOT Area:     3.46 cm  RIGHT VENTRICLE RV Basal diam:  3.20 cm RV Mid diam:    2.40 cm RV S prime:     15.60 cm/s TAPSE (M-mode): 2.4 cm LEFT ATRIUM             Index        RIGHT ATRIUM           Index LA diam:        3.90 cm 1.80 cm/m   RA Area:     14.90 cm LA Vol (A2C):   54.0 ml 24.95 ml/m  RA Volume:   34.00 ml  15.71 ml/m LA Vol (A4C):   61.8 ml 28.55 ml/m LA Biplane Vol: 58.3 ml 26.93 ml/m  AORTIC VALVE                    PULMONIC VALVE AV Area (Vmax):    2.83 cm     PV Vmax:       1.13 m/s AV Area (Vmean):    3.12 cm     PV Peak grad:  5.1 mmHg AV Area (VTI):     3.34 cm AV Vmax:           158.00 cm/s AV Vmean:          95.700 cm/s AV VTI:            0.279 m AV Peak Grad:      10.0 mmHg AV Mean Grad:      5.0 mmHg LVOT Vmax:         129.00 cm/s LVOT Vmean:        86.200 cm/s LVOT VTI:          0.269 m LVOT/AV VTI ratio: 0.96  AORTA Ao Root diam: 3.20 cm Ao Asc diam:  2.90 cm MITRAL VALVE               TRICUSPID VALVE MV Area (PHT): 3.65 cm    TR Peak grad:   12.8 mmHg MV Area VTI:   5.23 cm    TR Vmax:        179.00 cm/s MV Peak grad:  4.0 mmHg MV Mean grad:  2.0 mmHg    SHUNTS MV Vmax:       1.00 m/s    Systemic VTI:  0.27 m MV Vmean:      60.2 cm/s   Systemic Diam: 2.10 cm MV Decel Time: 208 msec MV E velocity: 53.70 cm/s MV A velocity: 77.90 cm/s MV E/A ratio:  0.69 Vishnu Priya Mallipeddi Electronically signed by Lorelee Cover Mallipeddi Signature Date/Time: 06/28/2022/1:54:06 PM    Final      Patient Profile     67 y.o. female with past medical history of hypertension, hyperlipidemia, chronic diastolic congestive heart failure, tobacco abuse, bipolar disorder,  colon cancer, schizophrenia admitted with exertional chest pain and dyspnea on exertion.  Echocardiogram this admission shows ejection fraction 60 to 50%, grade 1 diastolic dysfunction.  Cardiac catheterization revealed 80% distal circumflex which was treated with drug-eluting stent.  Assessment & Plan    1 unstable angina/coronary artery disease-patient presented with exertional chest tightness/dyspnea on exertion.  Cardiac catheterization performed yesterday revealed an 80% distal circumflex lesion and patient is now status post PCI.  Continue aspirin, Plavix, statin and Toprol.  2 chronic diastolic congestive heart failure-patient is euvolemic on examination today.  Would resume home dose of Lasix at discharge.  3 hypertension-blood pressure trending up.  Resume amlodipine 5 mg daily and Benicar 10 mg daily at discharge.  Follow blood  pressure as an outpatient and adjust as needed.  4 hyperlipidemia-given documented coronary disease will increase Crestor to 40 mg daily.  Check lipids and liver in 8 weeks.  Patient can be discharged from a cardiac standpoint.  Will arrange follow-up with APP and even 2 to 4 weeks after discharge.  Would continue present medications as outlined at discharge.  Please call with questions.  For questions or updates, please contact [place] Please consult [email or web address] for contact info under        Signed, Kirk Ruths, MD  06/30/2022, 8:04 AM

## 2022-06-30 NOTE — Progress Notes (Signed)
   Heart Failure Stewardship Pharmacist Progress Note   PCP: Denyce Robert, FNP PCP-Cardiologist: Chalmers Guest, MD    HPI:  67 yo F with PMH of HFpEF, HTN, HLD, tobacco use, anxiety, aortic atherosclerosis, back pain, bipolar disorder, colon cancer, depression, migraines, schizophrenia, and prior OD.  She presented to [facility] clinic for follow up on HFpEF. She continued to have shortness of breath and LE edema after increasing diuretics as outpatient. Weight stable. Sent to ED for IV diuresis. CXR without edema. ECHO 1/10 showed LVEF 60-65%, moderate asymmetric LVH, G1DD, RV normal. R/LHC om 1/12 with mild pulmonary hypertension and single vessel CAD s/p DES to circumflex.  Discharge HF Medications: Diuretic: furosemide 80 mg BID Beta Blocker: metoprolol XL 25 mg daily ACE/ARB/ARNI: olmesartan 10 mg daily  Prior to admission HF Medications: Diuretic: furosemide 80 mg BID ACE/ARB/ARNI: olmesartan 5 mg daily  Pertinent Lab Values: Serum creatinine 1.02, BUN 22, Potassium 4.0, Sodium 140, BNP 4, Magnesium 1.9  Vital Signs: Weight: 235 lbs (admission weight: 233 lbs) Blood pressure: 130/80s  Heart rate: 90s  I/O: incomplete  Medication Assistance / Insurance Benefits Check: Does the patient have prescription insurance?  Yes Type of insurance plan: [place] Medicaid  Outpatient Pharmacy:  Prior to admission outpatient pharmacy: [facility] Is the patient willing to use [facility] at discharge? Yes Is the patient willing to transition their outpatient pharmacy to utilize a [facility] outpatient pharmacy?   Pending    Assessment: 1. Acute on chronic diastolic CHF (LVEF 12-75%). NYHA class III symptoms. - Agree with resuming furosemide 80 mg BID at discharge. Strict I/Os and daily weights. Keep K>4 and Mag >2 - Continue metoprolol XL 25 mg daily - Agree with restarting olmesartan 10 mg daily at discharge - Consider adding spironolactone and SGLT2i at follow up for HFpEF    Plan: 1) Medication changes recommended at this time: - Add Farxiga 10 mg daily at follow up  2) Patient assistance: - Farxiga/Jardiance copay $0 - Entresto copay $0  3)  Education  - Patient has been educated on current HF medications and potential additions to HF medication regimen - Patient verbalizes understanding that over the next few months, these medication doses may change and more medications may be added to optimize HF regimen - Patient has been educated on basic disease state pathophysiology and goals of therapy   Kerby Nora, PharmD, BCPS Heart Failure Stewardship Pharmacist Phone [phone]

## 2022-06-30 NOTE — TOC Initial Note (Signed)
Transition of Care [facility]) - Initial/Assessment Note    Patient Details  Name: Ashley Patrick MRN: [id number] Date of Birth: 06/29/1955  [facility]) CM/SW Contact:    Bethena Roys, RN Phone Number: 06/30/2022, 10:03 AM  Clinical Narrative:  Patient presented as a transfer for chest pain-post cath. PTA patient was from home with significant other. Patient states she has DME cane at home. Patient has PCP and she gets to appointments without any issues. Patient reports that she gets medications without any issues as well. No home need identified at this time.               Expected Discharge Plan: Home/Self Care Barriers to Discharge: No Barriers Identified   Patient Goals and CMS Choice Patient states their goals for this hospitalization and ongoing recovery are:: to return home.   Choice offered to / list presented to : NA      Expected Discharge Plan and Services In-house Referral: NA Discharge Planning Services: CM Consult Post Acute Care Choice: NA Living arrangements for the past 2 months: Apartment Expected Discharge Date: 06/27/22               DME Arranged: N/A DME Agency: NA       HH Arranged: NA  Prior Living Arrangements/Services Living arrangements for the past 2 months: Apartment Lives with:: Significant Other Patient language and need for interpreter reviewed:: Yes Do you feel safe going back to the place where you live?: Yes      Need for Family Participation in Patient Care: No (Comment) Care giver support system in place?: No (comment) Current home services: DME (cane) Criminal Activity/Legal Involvement Pertinent to Current Situation/Hospitalization: No - Comment as needed  Activities of Daily Living Home Assistive Devices/Equipment: Cane (specify quad or straight), Shower chair with back ADL Screening (condition at time of admission) Patient's cognitive ability adequate to safely complete daily activities?: Yes Is the  patient deaf or have difficulty hearing?: No Does the patient have difficulty seeing, even when wearing glasses/contacts?: No Does the patient have difficulty concentrating, remembering, or making decisions?: No Patient able to express need for assistance with ADLs?: No Does the patient have difficulty dressing or bathing?: No Independently performs ADLs?: Yes (appropriate for developmental age) Does the patient have difficulty walking or climbing stairs?: No Weakness of Legs: Both Weakness of Arms/Hands: None  Permission Sought/Granted  Emotional Assessment Appearance:: Appears stated age Attitude/Demeanor/Rapport: Engaged Affect (typically observed): Appropriate Orientation: : Oriented to Self, Oriented to Place, Oriented to  Time, Oriented to Situation Alcohol / Substance Use: Not Applicable Psych Involvement: No (comment)  Admission diagnosis:  CHF (congestive heart failure) ([place]) [I50.9] Acute on chronic congestive heart failure, unspecified heart failure type ([place]) [I50.9] Patient Active Problem List   Diagnosis Date Noted   Coronary artery disease 06/29/2022   CHF (congestive heart failure) ([place]) 06/27/2022   Acute respiratory failure with hypoxia ([place]) 06/27/2022   DM II (diabetes mellitus, type II), controlled ([place]) 06/27/2022   HLD (hyperlipidemia) 04/18/2022   (HFpEF) heart failure with preserved ejection fraction ([place]) 04/18/2022   Mixed stress and urge urinary incontinence 06/01/2021   Continuous leakage of urine 06/01/2021   S/P hysterectomy 06/01/2021   Cystocele, midline 06/01/2021   OAB (overactive bladder) 06/01/2021   Preoperative cardiovascular examination 01/29/2017   MDD (major depressive disorder), recurrent, severe, with psychosis ([place]) 12/30/2016   Tobacco abuse counseling 09/13/2016   Gastroesophageal reflux disease 05/02/2016   Hiatal hernia    History  of colonic polyps    Diverticulosis of colon without hemorrhage    History of colon cancer  07/20/2014   Dysphagia 07/20/2014   Lower abdominal pain 09/30/2013   Anxiety, generalized 08/08/2013   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 08/07/2013   Noncompliance with medications 08/07/2013   Rectal bleeding 05/09/2013   LLQ pain 11/28/2012   Hemorrhagic cyst of ovary 11/28/2012   Nausea with vomiting 11/28/2012   UNSPECIFIED URINARY INCONTINENCE 09/03/2008   ACUTE CYSTITIS 07/19/2008   OBESITY 11/22/2007   UNSPECIFIED PSYCHOSIS 11/22/2007   Essential hypertension 11/22/2007   BACK PAIN 11/22/2007   LATERAL EPICONDYLITIS OF ELBOW 11/22/2007   PCP:  Denyce Robert, FNP Pharmacy:   [place] [place] [facility] [address] [place] [address] Phone: [phone] Fax: [phone]  [facility] [address] [address] Phone: [phone] Fax: [phone]  Social Determinants of Health (SDOH) Social History: SDOH Screenings   Food Insecurity: No Food Insecurity (06/28/2022)  Housing: Low Risk  (06/28/2022)  Transportation Needs: No Transportation Needs (06/28/2022)  Utilities: Not At Risk (06/28/2022)  Alcohol Screen: Low Risk  (06/01/2021)  Depression (PHQ2-9): Medium Risk (06/22/2022)  Financial Resource Strain: Low Risk  (06/01/2021)  Physical Activity: Insufficiently Active (06/01/2021)  Social Connections: Moderately Isolated (06/01/2021)  Stress: No Stress Concern Present (06/01/2021)  Tobacco Use: Medium Risk (06/30/2022)   Readmission Risk Interventions     No data to display

## 2022-07-01 LAB — LIPOPROTEIN A (LPA): Lipoprotein (a): 188.4 nmol/L — ABNORMAL HIGH (ref <75.0)

## 2022-07-11 ENCOUNTER — Encounter: Payer: Self-pay | Admitting: Nurse Practitioner

## 2022-07-11 ENCOUNTER — Ambulatory Visit: Payer: 59 | Attending: Nurse Practitioner | Admitting: Nurse Practitioner

## 2022-07-11 VITALS — BP 116/78 | HR 98 | Ht 66.0 in | Wt 241.8 lb

## 2022-07-11 DIAGNOSIS — I200 Unstable angina: Secondary | ICD-10-CM | POA: Diagnosis not present

## 2022-07-11 DIAGNOSIS — I272 Pulmonary hypertension, unspecified: Secondary | ICD-10-CM

## 2022-07-11 DIAGNOSIS — R200 Anesthesia of skin: Secondary | ICD-10-CM

## 2022-07-11 DIAGNOSIS — E785 Hyperlipidemia, unspecified: Secondary | ICD-10-CM

## 2022-07-11 DIAGNOSIS — E669 Obesity, unspecified: Secondary | ICD-10-CM

## 2022-07-11 DIAGNOSIS — Z79899 Other long term (current) drug therapy: Secondary | ICD-10-CM

## 2022-07-11 DIAGNOSIS — I5033 Acute on chronic diastolic (congestive) heart failure: Secondary | ICD-10-CM

## 2022-07-11 DIAGNOSIS — R202 Paresthesia of skin: Secondary | ICD-10-CM

## 2022-07-11 DIAGNOSIS — I10 Essential (primary) hypertension: Secondary | ICD-10-CM

## 2022-07-11 DIAGNOSIS — I5032 Chronic diastolic (congestive) heart failure: Secondary | ICD-10-CM | POA: Diagnosis not present

## 2022-07-11 DIAGNOSIS — I2510 Atherosclerotic heart disease of native coronary artery without angina pectoris: Secondary | ICD-10-CM | POA: Diagnosis not present

## 2022-07-11 MED ORDER — METOPROLOL SUCCINATE ER 50 MG PO TB24: 50.0000 mg | ORAL_TABLET | Freq: Every day | ORAL | 6 refills | Status: DC

## 2022-07-11 MED ORDER — OLMESARTAN MEDOXOMIL 5 MG PO TABS: 5.0000 mg | ORAL_TABLET | Freq: Every day | ORAL | 0 refills | Status: DC

## 2022-07-11 NOTE — Progress Notes (Unsigned)
Cardiology Office Note:    Date:  07/11/2022  ID:  Ashley Patrick, DOB Oct 24, 1955, MRN [id number]  PCP:  Denyce Robert, [place] [facility] Cardiologist:  Chalmers Guest, MD     Referring MD: Denyce Robert, FNP   CC: Here for HFpEF follow-up  History of Present Illness:    Ashley Patrick is a 67 y.o. female with a hx of the following:  CAD, s/p unstable angina and DES to Lcx (06/2022) HFpEF, pulmonary HTN Hypertension Hyperlipidemia Former tobacco abuse Panic attacks  Patient is a very pleasant 67 year old female with past medical history as mentioned above.  Last seen by Dr. Dellia Cloud on June 23, 2022 for follow-up of HFpEF.  She continued to have DOE and new bilateral leg swelling despite increasing torsemide to 10 mg twice daily.  Becomes short of breath with walking a few steps.  Denied any chest pain, palpitations, syncope, lightheadedness or dizziness.  She was planning on undergoing surgery (infraumbilical panniculectomy) and her PCP was requesting cardiology clearance.  Patient switched from smoking to vaping.  Torsemide was stopped and was switched to Lasix 80 mg twice daily, started on potassium 20 mill, once daily.  Dr. Dellia Cloud stated she needs to be volume optimized before undergoing invasive procedures.  Was told to follow-up in 5 days with me in 3 months with Dr. Vonzella Nipple.  I last saw her for 5-day follow-up on June 27, 2022.  She states her symptoms are about the same.  She was still short of breath at rest and with minimal exertion. Compliant with her medications. Weight was stable.  Leg edema improved but CC was shortness of breath.  She was short of breath at rest and was sent to [facility] ED for further evaluation.  Treated with diuresis.  During hospital stay she experienced exertional chest pain/unstable angina, was transferred to [facility] for right and left heart cath.  Cardiac catheterization revealed mild pulmonary  hypertension, single-vessel CAD in circumflex and was treated successfully with drug-eluting stents to circumflex.  Was recommended continued DAPT for 6 months with aggressive secondary prevention.  TTE revealed normal EF, moderate LVH, grade 1 DD no significant valvular abnormalities.  Developed superficial thrombophlebitis over dorsal aspect of left wrist, was managed conservatively.  Was discharged in stable condition on June 30, 2022.  Was told to follow-up with outpatient cardiology.  Today she presents for outpatient follow-up.  She states she is doing well from a cardiac perspective. Denies any chest pain, shortness of breath, palpitations, syncope, presyncope, dizziness, orthopnea, PND, swelling or significant weight changes, acute bleeding, or claudication. Compliant with her medications and tolerating them well. Denies any other questions or concerns today.   Past Medical History:  Diagnosis Date   Anxiety    Aortic atherosclerosis (HCC)    Arthritis    Asthma    Back pain    Bipolar disorder ([place])    Cancer ([place]) 2014   Colon Cancer   Constipation    Depression    Dyspnea    Hypercholesteremia    Hypertension    Insomnia    Lateral epicondylitis  of elbow    right   Migraine headache    Nicotine addiction    Obesity    History of   OD (overdose of drug)    hospitalized for 11 days    Panic attacks    Psychosis ([place])    Schizophrenia ([place])     Past Surgical History:  Procedure Laterality  Date   ABDOMINAL HYSTERECTOMY     BIOPSY N/A 08/13/2014   Procedure: BIOPSY;  Surgeon: Daneil Dolin, MD;  Location: [facility];  Service: Endoscopy;  Laterality: N/A;   BREAST BIOPSY  10/18/2011   Procedure: BREAST BIOPSY;  Surgeon: Jamesetta So, MD;  Location: [facility];  Service: General;  Laterality: Left;   COLON RESECTION N/A 06/23/2013   Procedure: HAND ASSISTED LAPAROSCOPIC PARTIAL COLECTOMY;  Surgeon: Jamesetta So, MD;  Location: [facility];  Service: General;  Laterality:  N/A;   COLONOSCOPY     COLONOSCOPY N/A 05/29/2013   QPR:[id number] mass most likely representing colorectal cancer S/ P biospy.Multiple colonic and rectal polyps removed/treated as described above. Colonic diverticulosis   COLONOSCOPY WITH PROPOFOL N/A 08/13/2014   RMR: Status post sigmoid colectomy. Multiple colonic polyps removed as described above. Redundant colon. Pan colonic diverticuloisi   COLONOSCOPY WITH PROPOFOL N/A 05/22/2016   Surgeon: Daneil Dolin, MD; 2 polyps removed, but did not survive pathology processing.  Surgical anastomosis in the sigmoid colon noted, appeared patent, question somewhat narrowed just proximal anastomosis.  Repeat in 3 years.   COLONOSCOPY WITH PROPOFOL N/A 08/05/2021   Procedure: COLONOSCOPY WITH PROPOFOL;  Surgeon: Eloise Harman, DO;  Location: [facility];  Service: Endoscopy;  Laterality: N/A;  10:00am   CORONARY STENT INTERVENTION N/A 06/29/2022   Procedure: CORONARY STENT INTERVENTION;  Surgeon: Jettie Booze, MD;  Location: [place] [facility];  Service: Cardiovascular;  Laterality: N/A;   ESOPHAGEAL DILATION N/A 08/13/2014   Procedure: [place];  Surgeon: Daneil Dolin, MD;  Location: [facility];  Service: Endoscopy;  Laterality: N/A;   ESOPHAGOGASTRODUODENOSCOPY N/A 12/26/2012   [id number]:[id number] reflux esophagitis. Gastric and duodenal bulbar erosions-status post gastric biopsynegative H.pylori   ESOPHAGOGASTRODUODENOSCOPY (EGD) WITH PROPOFOL N/A 08/13/2014   RMR: Small benign cystic-appearing lesion in distal esophagus of doubtful clinical significance, otherwise normal esophagus, status post Maloney dilation. Small hiatal hernia, some retained gastric contents (query delayed gastric emptying.)   INTRAVASCULAR ULTRASOUND/IVUS N/A 06/29/2022   Procedure: Intravascular Ultrasound/IVUS;  Surgeon: Jettie Booze, MD;  Location: [place] [facility];  Service: Cardiovascular;  Laterality: N/A;   partial hysterectomy      POLYPECTOMY N/A 08/13/2014   Procedure: POLYPECTOMY;  Surgeon: Daneil Dolin, MD;  Location: [facility];  Service: Endoscopy;  Laterality: N/A;   POLYPECTOMY  08/05/2021   Procedure: POLYPECTOMY;  Surgeon: Eloise Harman, DO;  [facility];  Service: Endoscopy;;   RIGHT/LEFT HEART CATH AND CORONARY ANGIOGRAPHY N/A 06/29/2022   Procedure: RIGHT/LEFT HEART CATH AND CORONARY ANGIOGRAPHY;  Surgeon: Jettie Booze, MD;  Location: [place] [facility];  Service: Cardiovascular;  Laterality: N/A;    Current Medications: Current Meds  Medication Sig   acetaminophen (TYLENOL) 650 MG CR tablet Take 650 mg by mouth every 8 (eight) hours as needed for pain.   albuterol (VENTOLIN HFA) 108 (90 Base) MCG/ACT inhaler Inhale 2 puffs into the lungs every 4 (four) hours as needed for wheezing or shortness of breath.   amLODipine (NORVASC) 5 MG tablet Take 5 mg by mouth every morning.   aspirin EC 81 MG tablet Take 81 mg by mouth daily. Swallow whole.   [START ON 08/10/2022] cariprazine (VRAYLAR) 1.5 MG capsule Take 1 capsule (1.5 mg total) by mouth daily.   clopidogrel (PLAVIX) 75 MG tablet Take 1 tablet (75 mg total) by mouth daily with breakfast.   fluticasone (FLONASE) 50 MCG/ACT nasal spray Place 2  sprays into both nostrils as needed.   furosemide (LASIX) 80 MG tablet Take 1 tablet (80 mg total) by mouth in the morning. & 80 mg at 5:00 pm   metFORMIN (GLUCOPHAGE) 500 MG tablet Take 500 mg by mouth every morning.   Multiple Vitamins-Minerals (MULTIVITAMIN WITH MINERALS) tablet Take 1 tablet by mouth daily.   pantoprazole (PROTONIX) 40 MG tablet Take 1 tablet (40 mg total) by mouth daily.   potassium chloride SA (KLOR-CON M) 20 MEQ tablet Take 1 tablet (20 mEq total) by mouth daily.   rosuvastatin (CRESTOR) 40 MG tablet Take 1 tablet (40 mg total) by mouth daily.   SPIRIVA RESPIMAT 1.25 MCG/ACT AERS Inhale 2 puffs into the lungs daily.   tetrabenazine (XENAZINE) 12.5 MG tablet Take 1 tablet (12.5  mg total) by mouth 2 (two) times daily.   traZODone (DESYREL) 150 MG tablet Take 300 mg by mouth at bedtime.   vitamin B-12 (CYANOCOBALAMIN) 1000 MCG tablet Take 1,000 mcg by mouth daily.   zaleplon (SONATA) 5 MG capsule Take 1 capsule (5 mg total) by mouth at bedtime as needed for sleep.   metoprolol succinate (TOPROL-XL) 25 MG 24 hr tablet Take 1 tablet (25 mg total) by mouth daily.   olmesartan (BENICAR) 5 MG tablet Take 2 tablets (10 mg total) by mouth daily. (Patient taking differently: Take 5 mg by mouth daily.)     Allergies:   Patient has no known allergies.   Social History   Socioeconomic History   Marital status: Legally Separated    Spouse name: Not on file   Number of children: 3   Years of education: Not on file   Highest education level: Not on file  Occupational History   Not on file  Tobacco Use   Smoking status: Former    Packs/day: 0.50    Years: 38.00    Total pack years: 19.00    Types: Cigarettes    Quit date: 06/19/2022    Years since quitting: 0.0   Smokeless tobacco: Never   Tobacco comments:    States she has not smoked this week and is attempting to quit.   Vaping Use   Vaping Use: Never used  Substance and Sexual Activity   Alcohol use: No    Alcohol/week: 0.0 standard drinks of alcohol   Drug use: No   Sexual activity: Not Currently    Birth control/protection: Surgical    Comment: hyst  Other Topics Concern   Not on file  Social History Narrative   Not on file   Social Determinants of Health   Financial Resource Strain: Low Risk  (06/01/2021)   Overall Financial Resource Strain (CARDIA)    Difficulty of Paying Living Expenses: Not very hard  Food Insecurity: No Food Insecurity (06/28/2022)   Hunger Vital Sign    Worried About Running Out of Food in the Last Year: Never true    Ran Out of Food in the Last Year: Never true  Transportation Needs: No Transportation Needs (06/28/2022)   PRAPARE - Hydrologist  (Medical): No    Lack of Transportation (Non-Medical): No  Physical Activity: Insufficiently Active (06/01/2021)   Exercise Vital Sign    Days of Exercise per Week: 1 day    Minutes of Exercise per Session: 10 min  Stress: No Stress Concern Present (06/01/2021)   [place]    Feeling of Stress : Only a little  Social Connections:  Moderately Isolated (06/01/2021)   Social Connection and Isolation Panel [NHANES]    Frequency of Communication with Friends and Family: Twice a week    Frequency of Social Gatherings with Friends and Family: Once a week    Attends Religious Services: 1 to 4 times per year    Active Member of [employer] or Organizations: No    Attends Music therapist: Never    Marital Status: Separated     Family History: The patient's family history includes Arthritis in her father and mother; Asthma in her sister; Colon cancer in her father and paternal uncle; Drug abuse in her father and mother; HIV/AIDS in her brother; Hypertension in her sister; Stroke in her mother. There is no history of Anesthesia problems, Hypotension, Malignant hyperthermia, or Pseudochol deficiency.  ROS:   Review of Systems  Constitutional: Negative.   HENT: Negative.    Eyes: Negative.   Respiratory: Negative.    Cardiovascular: Negative.   Gastrointestinal: Negative.   Genitourinary: Negative.   Musculoskeletal: Negative.   Skin: Negative.   Neurological:  Positive for tingling. Negative for dizziness, tremors, sensory change, speech change, focal weakness, seizures, loss of consciousness, weakness and headaches.       Tingling/numbness along arms/hands.   Endo/Heme/Allergies: Negative.   Psychiatric/Behavioral: Negative.       Please see the history of present illness.    All other systems reviewed and are negative.  EKGs/Labs/Other Studies Reviewed:    The following studies were reviewed today:   EKG:   EKG is not ordered today.    Right and left heart cath on 06/29/2022:     Prox RCA to Mid RCA lesion is 25% stenosed.   Mid LAD lesion is 25% stenosed.   Dist Cx lesion is 80% stenosed.   A drug-eluting stent was successfully placed using a SYNERGY XD 3.50X16, postdilated to greater than 4 mm and optimized with intravascular ultrasound.   Post intervention, there is a 0% residual stenosis.   LV end diastolic pressure is normal.   Hemodynamic findings consistent with mild pulmonary hypertension.   There is no aortic valve stenosis.   Aortic saturation 89%, PA saturation 65%, mean right atrial pressure 9 mmHg, PA pressure 33/24, mean PA pressure 29 mmHg, mean pulmonary capillary wedge pressure 15 mmHg, cardiac output 6.7 L/min, cardiac index 3.13.   Mild pulmonary hypertension. Single-vessel coronary artery disease in the circumflex, successfully treated with a 3.5 x 16 Synergy drug-eluting stent postdilated to greater than 4 mm. Continue dual antiplatelet therapy for 6 months along with aggressive secondary prevention.  Echocardiogram on 06/28/2022: 1. Left ventricular ejection fraction, by estimation, is 60 to 65%. The  left ventricle has normal function. Left ventricular endocardial border  not optimally defined to evaluate regional wall motion. There is moderate  asymmetric left ventricular  hypertrophy of the lateral segment. Left ventricular diastolic parameters  are consistent with Grade I diastolic dysfunction (impaired relaxation).   2. Right ventricular systolic function is normal. The right ventricular  size is normal. There is normal pulmonary artery systolic pressure. The  estimated right ventricular systolic pressure is 26.7 mmHg.   3. The mitral valve is grossly normal. No evidence of mitral valve  regurgitation. No evidence of mitral stenosis.   4. The aortic valve is tricuspid. Aortic valve regurgitation is not  visualized. No aortic stenosis is present.   5. The inferior  vena cava is normal in size with greater than 50%  respiratory variability, suggesting right atrial pressure  of 3 mmHg.   Comparison(s): No significant change from prior study.  Venous ultrasound right lower extremity (DVT) on June 23, 2022: No right lower extremity DVT.   Lexiscan on May 15, 2022:   No ST deviation was noted during stress.   LV perfusion is normal. There is no evidence of current ischemia or prior infarction.   Left ventricular function is normal.   The study is normal. The study is low risk.  Echocardiogram on Nov 11, 2021: 1. Left ventricular ejection fraction, by estimation, is 60 to 65%. The  left ventricle has normal function. The left ventricle has no regional  wall motion abnormalities. There is mild left ventricular hypertrophy.  Left ventricular diastolic parameters  are consistent with Grade I diastolic dysfunction (impaired relaxation).   2. Right ventricular systolic function is normal. The right ventricular  size is normal. There is normal pulmonary artery systolic pressure. The  estimated right ventricular systolic pressure is 96.2 mmHg.   3. Left atrial size was moderately dilated.   4. The mitral valve is grossly normal. Trivial mitral valve  regurgitation.   5. The aortic valve is tricuspid. Aortic valve regurgitation is not  visualized.   6. The inferior vena cava is normal in size with greater than 50%  respiratory variability, suggesting right atrial pressure of 3 mmHg.    Recent Labs: 12/14/2021: ALT 18 06/27/2022: Magnesium 1.9 06/28/2022: B Natriuretic Peptide 4.0; TSH 2.735 06/30/2022: BUN 22; Creatinine, Ser 1.02; Hemoglobin 12.5; Platelets 196; Potassium 4.0; Sodium 140  Recent Lipid Panel    Component Value Date/Time   CHOL 156 06/28/2022 0456   TRIG 116 06/28/2022 0456   HDL 53 06/28/2022 0456   CHOLHDL 2.9 06/28/2022 0456   VLDL 23 06/28/2022 0456   LDLCALC 80 06/28/2022 0456     Risk Assessment/Calculations:         The 10-year ASCVD risk score (Arnett DK, et al., 2019) is: 12.6%   Values used to calculate the score:     Age: 20 years     Sex: Female     Is Non-Hispanic African American: Yes     Diabetic: Yes     Tobacco smoker: No     Systolic Blood Pressure: 836 mmHg     Is BP treated: Yes     HDL Cholesterol: 53 mg/dL     Total Cholesterol: 156 mg/dL      Physical Exam:    VS:  BP 116/78   Pulse 98   Ht '5\' 6"'$  (1.676 m)   Wt 241 lb 12.8 oz (109.7 kg)   SpO2 93%   BMI 39.03 kg/m     Wt Readings from Last 3 Encounters:  07/11/22 241 lb 12.8 oz (109.7 kg)  06/30/22 235 lb 3.7 oz (106.7 kg)  06/27/22 240 lb (108.9 kg)     GEN: Obese, 67 y.o. female in no acute distress HEENT: Normal NECK: No JVD; No carotid bruits CARDIAC: S1/S2, fast rate and regular rhythm, no murmurs, rubs, gallops; 2+ pulses RESPIRATORY:  Clear/diminished to auscultation without rales, wheezing or rhonchi MUSCULOSKELETAL:  No edema; No deformity  SKIN: Warm and dry NEUROLOGIC:  Alert and oriented x 3 PSYCHIATRIC:  Normal affect   ASSESSMENT:    1. Coronary artery disease involving native heart without angina pectoris, unspecified vessel or lesion type   2. Unstable angina ([place])   3. Chronic heart failure with preserved ejection fraction (HCC)   4. Pulmonary HTN ([place])   5. Essential hypertension  6. Hyperlipidemia, unspecified hyperlipidemia type   7. Medication management   8. Obesity (BMI 30-39.9)   9. Numbness and tingling in both hands    PLAN:    In order of problems listed above:   CAD, s/p unstable angina with DES to Circumflex (06/2022) Experienced exertional chest tightness/dyspnea on exertion.  Due to concern of unstable angina, patient underwent right and left heart cath on 06/29/2022 that revealed mild pulmonary hypertension with single-vessel CAD in circumflex, was treated successfully with DES to circumflex.  Plan to continue DAPT for 6 months with aggressive secondary prevention.   Cardiac catheterization site is healing well and denies any complications.  Continue aspirin, Plavix, Toprol-XL (will increase to 50 mg daily), rosuvastatin, and will decrease olmesartan to 5 mg daily. Heart healthy diet and regular cardiovascular exercise encouraged.  Patient is okay to start cardiac rehab. Will obtain CBC and CMET in 1 week per hospitalist's recommendations.     Cardiac Rehabilitation Eligibility Assessment  The patient is ready to start cardiac rehabilitation from a cardiac standpoint.    2. HFpEF, pulmonary HTN Recent echocardiogram revealed normal EF, moderate asymmetric LVH, grade 1 DD. Euvolemic and well compensated on exam.  Continue Lasix and potassium. Low sodium diet, fluid restriction <2L, and daily weights encouraged. Educated to contact our office for weight gain of 2 lbs overnight or 5 lbs in one week. Heart healthy diet and regular cardiovascular exercise encouraged.   3.  Hypertension Blood pressure well-controlled today.  BP well-controlled at home.  She is slightly tachycardic on exam today.  Will increase Toprol-XL to 50 mg daily and decrease olmesartan to 5 mg daily. Discussed to monitor BP at home at least 2 hours after medications and sitting for 5-10 minutes. Heart healthy diet and regular cardiovascular exercise encouraged. Recommended to obtain Omron cuff.   4.  Hyperlipidemia, medication management Crestor was increased to 40 mg daily in the hospital. Continue Crestor. Dr. Stanford Breed recommended checking lipids and liver function enzymes in 8 weeks.  Will arrange FLP and LFT in 8 weeks. Heart healthy diet and regular cardiovascular exercise encouraged.   5. Obesity BMI today is 39.03. Weight loss via diet and exercise encouraged. Discussed the impact being overweight would have on cardiovascular risk. Heart healthy diet and regular cardiovascular exercise encouraged.   6. Tingling hands/arms Stated does not sound cardiac related and sounds MSK related.  Recommended to follow with PCP regarding this.   7. Disposition: Follow-up with Dr. Dellia Cloud in 2-3 months or sooner if anything changes.    Medication Adjustments/Labs and Tests Ordered: Current medicines are reviewed at length with the patient today.  Concerns regarding medicines are outlined above.  Orders Placed This Encounter  Procedures   Lipid panel   Hepatic function panel   CBC   Comprehensive metabolic panel   Meds ordered this encounter  Medications   metoprolol succinate (TOPROL-XL) 50 MG 24 hr tablet    Sig: Take 1 tablet (50 mg total) by mouth daily.    Dispense:  30 tablet    Refill:  6    Dose increased 07/11/2022   olmesartan (BENICAR) 5 MG tablet    Sig: Take 1 tablet (5 mg total) by mouth daily.    Dispense:  30 tablet    Refill:  0    Dose decreased 07/11/2022    Patient Instructions  Medication Instructions:  Increase Metoprolol to '50mg'$  daily  Decrease Olmesartan to '5mg'$  daily  Continue all other medications.     Labwork:  CBC, CMET - due in 1 week FLP, LFT - due in 2 months - Reminder:  Nothing to eat or drink after 12 midnight prior to labs. Orders given today  Office will contact with results via phone, letter or mychart.     Testing/Procedures: none  Follow-Up: 2-3 moths - Dr. Dellia Cloud   Any Other Special Instructions Will Be Listed Below (If Applicable).   If you need a refill on your cardiac medications before your next appointment, please call your pharmacy.    Signed, Finis Bud, NP  07/12/2022 9:44 PM    [place]

## 2022-07-11 NOTE — Patient Instructions (Addendum)
Medication Instructions:  Increase Metoprolol to '50mg'$  daily  Decrease Olmesartan to '5mg'$  daily  Continue all other medications.     Labwork: CBC, CMET - due in 1 week FLP, LFT - due in 2 months - Reminder:  Nothing to eat or drink after 12 midnight prior to labs. Orders given today  Office will contact with results via phone, letter or mychart.     Testing/Procedures: none  Follow-Up: 2-3 moths - Dr. Dellia Cloud   Any Other Special Instructions Will Be Listed Below (If Applicable).   If you need a refill on your cardiac medications before your next appointment, please call your pharmacy.

## 2022-07-14 ENCOUNTER — Ambulatory Visit: Payer: Medicare Other | Admitting: Internal Medicine

## 2022-07-17 ENCOUNTER — Other Ambulatory Visit: Payer: Self-pay | Admitting: Psychiatry

## 2022-07-26 LAB — CBC
Hematocrit: 36.7 % (ref 34.0–46.6)
Hemoglobin: 12.3 g/dL (ref 11.1–15.9)
MCH: 28.9 pg (ref 26.6–33.0)
MCHC: 33.5 g/dL (ref 31.5–35.7)
MCV: 86 fL (ref 79–97)
Platelets: 230 10*3/uL (ref 150–450)
RBC: 4.25 x10E6/uL (ref 3.77–5.28)
RDW: 13.5 % (ref 11.7–15.4)
WBC: 6.1 10*3/uL (ref 3.4–10.8)

## 2022-07-26 LAB — BASIC METABOLIC PANEL
BUN/Creatinine Ratio: 10 — ABNORMAL LOW (ref 12–28)
BUN: 11 mg/dL (ref 8–27)
CO2: 27 mmol/L (ref 20–29)
Calcium: 10.2 mg/dL (ref 8.7–10.3)
Chloride: 102 mmol/L (ref 96–106)
Creatinine, Ser: 1.14 mg/dL — ABNORMAL HIGH (ref 0.57–1.00)
Glucose: 93 mg/dL (ref 70–99)
Potassium: 3.9 mmol/L (ref 3.5–5.2)
Sodium: 144 mmol/L (ref 134–144)
eGFR: 54 mL/min/{1.73_m2} — ABNORMAL LOW (ref >59)

## 2022-07-26 LAB — LIPID PANEL
Chol/HDL Ratio: 2.7 ratio (ref 0.0–4.4)
Cholesterol, Total: 138 mg/dL (ref 100–199)
HDL: 52 mg/dL (ref >39)
LDL Chol Calc (NIH): 65 mg/dL (ref 0–99)
Triglycerides: 120 mg/dL (ref 0–149)
VLDL Cholesterol Cal: 21 mg/dL (ref 5–40)

## 2022-07-26 LAB — HEPATIC FUNCTION PANEL
ALT: 36 IU/L — ABNORMAL HIGH (ref 0–32)
AST: 26 IU/L (ref 0–40)
Albumin: 4.7 g/dL (ref 3.9–4.9)
Alkaline Phosphatase: 98 IU/L (ref 44–121)
Bilirubin Total: <0.2 mg/dL (ref 0.0–1.2)
Bilirubin, Direct: <0.1 mg/dL (ref 0.00–0.40)
Total Protein: 7.4 g/dL (ref 6.0–8.5)

## 2022-07-27 ENCOUNTER — Encounter (HOSPITAL_COMMUNITY)
Admission: RE | Admit: 2022-07-27 | Discharge: 2022-07-27 | Disposition: A | Discharge status: Home / Self Care | Payer: 59 | Source: Ambulatory Visit | Attending: Internal Medicine | Admitting: Internal Medicine

## 2022-07-27 VITALS — BP 120/70 | HR 88 | Ht 66.0 in | Wt 239.2 lb

## 2022-07-27 DIAGNOSIS — Z955 Presence of coronary angioplasty implant and graft: Secondary | ICD-10-CM | POA: Diagnosis present

## 2022-07-27 LAB — GLUCOSE, CAPILLARY: Glucose-Capillary: 114 mg/dL — ABNORMAL HIGH (ref 70–99)

## 2022-07-27 NOTE — Progress Notes (Signed)
Cardiac Individual Treatment Plan  Patient Details  Name: Ashley Patrick MRN: [id number] Date of Birth: [birth year] Referring Provider:   Flowsheet Row CARDIAC REHAB PHASE II ORIENTATION from 07/27/2022 in [facility]  Referring Provider Dr. Florene Glen       Initial Encounter Date:  Flowsheet Row CARDIAC REHAB PHASE II ORIENTATION from 07/27/2022 in [facility]  Date 07/27/22       Visit Diagnosis: S/P coronary artery stent placement  Patient's Home Medications on Admission:  Current Outpatient Medications:    acetaminophen (TYLENOL) 650 MG CR tablet, Take 650 mg by mouth every 8 (eight) hours as needed for pain., Disp: , Rfl:    albuterol (VENTOLIN HFA) 108 (90 Base) MCG/ACT inhaler, Inhale 2 puffs into the lungs every 4 (four) hours as needed for wheezing or shortness of breath., Disp: , Rfl:    amLODipine (NORVASC) 5 MG tablet, Take 5 mg by mouth every morning., Disp: , Rfl:    aspirin EC 81 MG tablet, Take 81 mg by mouth daily. Swallow whole., Disp: , Rfl:    [START ON 08/10/2022] cariprazine (VRAYLAR) 1.5 MG capsule, Take 1 capsule (1.5 mg total) by mouth daily., Disp: 90 capsule, Rfl: 0   celecoxib (CELEBREX) 100 MG capsule, Take 100 mg by mouth 2 (two) times daily as needed for mild pain., Disp: , Rfl:    clopidogrel (PLAVIX) 75 MG tablet, Take 1 tablet (75 mg total) by mouth daily with breakfast., Disp: 30 tablet, Rfl: 5   furosemide (LASIX) 80 MG tablet, Take 1 tablet (80 mg total) by mouth in the morning. & 80 mg at 5:00 pm, Disp: 60 tablet, Rfl: 3   metFORMIN (GLUCOPHAGE) 500 MG tablet, Take 500 mg by mouth every morning., Disp: , Rfl:    metoprolol succinate (TOPROL-XL) 50 MG 24 hr tablet, Take 1 tablet (50 mg total) by mouth daily., Disp: 30 tablet, Rfl: 6   Multiple Vitamins-Minerals (MULTIVITAMIN WITH MINERALS) tablet, Take 1 tablet by mouth daily., Disp: , Rfl:    olmesartan (BENICAR) 5 MG tablet, Take 1 tablet (5 mg total) by mouth  daily., Disp: 30 tablet, Rfl: 0   pantoprazole (PROTONIX) 40 MG tablet, Take 1 tablet (40 mg total) by mouth daily., Disp: 30 tablet, Rfl: 3   potassium chloride SA (KLOR-CON M) 20 MEQ tablet, Take 1 tablet (20 mEq total) by mouth daily., Disp: 30 tablet, Rfl: 3   rosuvastatin (CRESTOR) 40 MG tablet, Take 1 tablet (40 mg total) by mouth daily., Disp: 30 tablet, Rfl: 1   SPIRIVA RESPIMAT 1.25 MCG/ACT AERS, Inhale 2 puffs into the lungs daily., Disp: , Rfl:    tetrabenazine (XENAZINE) 12.5 MG tablet, Take 1 tablet (12.5 mg total) by mouth 2 (two) times daily., Disp: 180 tablet, Rfl: 0   traZODone (DESYREL) 150 MG tablet, Take 300 mg by mouth at bedtime., Disp: , Rfl:    vitamin B-12 (CYANOCOBALAMIN) 1000 MCG tablet, Take 1,000 mcg by mouth daily., Disp: , Rfl:    zaleplon (SONATA) 5 MG capsule, Take 1 capsule (5 mg total) by mouth at bedtime as needed for sleep., Disp: 30 capsule, Rfl: 2  Past Medical History: Past Medical History:  Diagnosis Date   Anxiety    Aortic atherosclerosis ([place])    Arthritis    Asthma    Back pain    Bipolar disorder ([place])    Cancer ([place]) 2014   Colon Cancer   Constipation    Depression    Dyspnea  Hypercholesteremia    Hypertension    Insomnia    Lateral epicondylitis  of elbow    right   Migraine headache    Nicotine addiction    Obesity    History of   OD (overdose of drug)    hospitalized for 11 days    Panic attacks    Psychosis ([place])    Schizophrenia ([place])     Tobacco Use: Social History   Tobacco Use  Smoking Status Former   Packs/day: 0.50   Years: 38.00   Total pack years: 19.00   Types: Cigarettes   Quit date: 06/19/2022   Years since quitting: 0.1  Smokeless Tobacco Never  Tobacco Comments   States she has not smoked this week and is attempting to quit.     Labs: Review Flowsheet  More data exists      Latest Ref Rng & Units 04/24/2022 06/28/2022 06/29/2022 06/30/2022 07/25/2022  Labs for ITP Cardiac and Pulmonary Rehab   Cholestrol 100 - 199 mg/dL 258  156  - - 138   LDL (calc) 0 - 99 mg/dL 172  80  - - 65   HDL-C >39 mg/dL 50  53  - - 52   Trlycerides 0 - 149 mg/dL 178  116  - - 120   Hemoglobin A1c 4.8 - 5.6 % - - - 5.8  -  PH, Arterial 7.35 - 7.45 - - 7.351  - -  PCO2 arterial 32 - 48 mmHg - - 48.3  - -  Bicarbonate 20.0 - 28.0 mmol/L 20.0 - 28.0 mmol/L - - 28.8  29.6  26.8  - -  TCO2 22 - 32 mmol/L 22 - 32 mmol/L - - '30  31  28  '$ - -  O2 Saturation % % - - 65  65  89  - -    Capillary Blood Glucose: Lab Results  Component Value Date   GLUCAP 114 (H) 07/27/2022   GLUCAP 90 06/30/2022   GLUCAP 92 06/30/2022   GLUCAP 116 (H) 06/28/2022   GLUCAP 96 06/23/2013    POCT Glucose     Row Name 07/27/22 1304             POCT Blood Glucose   Pre-Exercise 114 mg/dL                Exercise Target Goals: Exercise Program Goal: Individual exercise prescription set using results from initial 6 min walk test and THRR while considering  patient's activity barriers and safety.   Exercise Prescription Goal: Starting with aerobic activity 30 plus minutes a day, 3 days per week for initial exercise prescription. Provide home exercise prescription and guidelines that participant acknowledges understanding prior to discharge.  Activity Barriers & Risk Stratification:  Activity Barriers & Cardiac Risk Stratification - 07/27/22 1312       Activity Barriers & Cardiac Risk Stratification   Activity Barriers Arthritis;Joint Problems;Deconditioning;Shortness of Breath;Balance Concerns;History of Falls;Assistive Device    Cardiac Risk Stratification High             6 Minute Walk:  6 Minute Walk     Row Name 07/27/22 1314         6 Minute Walk   Phase Initial     Distance 1200 feet     Walk Time 6 minutes     # of Rest Breaks 0     MPH 2.27     METS 2.54     RPE 14  VO2 Peak 8.89     Symptoms Yes (comment)     Comments Slight SOB while walking     Resting HR 88 bpm     Resting  BP 120/70     Resting Oxygen Saturation  99 %     Exercise Oxygen Saturation  during 6 min walk 98 %     Max Ex. HR 106 bpm     Max Ex. BP 140/70     2 Minute Post BP 132/70              Oxygen Initial Assessment:   Oxygen Re-Evaluation:   Oxygen Discharge (Final Oxygen Re-Evaluation):   Initial Exercise Prescription:  Initial Exercise Prescription - 07/27/22 1300       Date of Initial Exercise RX and Referring Provider   Date 07/27/22    Referring Provider Dr. Florene Glen    Expected Discharge Date 10/20/22      Treadmill   MPH 1.8    Grade 0    Minutes 17      NuStep   Level 1    SPM 60    Minutes 22      Prescription Details   Frequency (times per week) 3    Duration Progress to 30 minutes of continuous aerobic without signs/symptoms of physical distress      Intensity   THRR 40-80% of Max Heartrate 62-125    Ratings of Perceived Exertion 11-13    Perceived Dyspnea 0-4      Resistance Training   Training Prescription Yes    Weight 3    Reps 10-15             Perform Capillary Blood Glucose checks as needed.  Exercise Prescription Changes:   Exercise Comments:   Exercise Goals and Review:   Exercise Goals     Row Name 07/27/22 1316             Exercise Goals   Increase Physical Activity Yes       Intervention Provide advice, education, support and counseling about physical activity/exercise needs.;Develop an individualized exercise prescription for aerobic and resistive training based on initial evaluation findings, risk stratification, comorbidities and participant's personal goals.       Expected Outcomes Short Term: Attend rehab on a regular basis to increase amount of physical activity.;Long Term: Add in home exercise to make exercise part of routine and to increase amount of physical activity.;Long Term: Exercising regularly at least 3-5 days a week.       Increase Strength and Stamina Yes       Intervention Provide advice,  education, support and counseling about physical activity/exercise needs.;Develop an individualized exercise prescription for aerobic and resistive training based on initial evaluation findings, risk stratification, comorbidities and participant's personal goals.       Expected Outcomes Short Term: Increase workloads from initial exercise prescription for resistance, speed, and METs.;Short Term: Perform resistance training exercises routinely during rehab and add in resistance training at home;Long Term: Improve cardiorespiratory fitness, muscular endurance and strength as measured by increased METs and functional capacity (6MWT)       Able to understand and use rate of perceived exertion (RPE) scale Yes       Intervention Provide education and explanation on how to use RPE scale       Expected Outcomes Long Term:  Able to use RPE to guide intensity level when exercising independently;Short Term: Able to use RPE daily in rehab to express subjective intensity  level       Knowledge and understanding of Target Heart Rate Range (THRR) Yes       Intervention Provide education and explanation of THRR including how the numbers were predicted and where they are located for reference       Expected Outcomes Short Term: Able to state/look up THRR;Short Term: Able to use daily as guideline for intensity in rehab;Long Term: Able to use THRR to govern intensity when exercising independently       Able to check pulse independently Yes       Intervention Provide education and demonstration on how to check pulse in carotid and radial arteries.;Review the importance of being able to check your own pulse for safety during independent exercise       Expected Outcomes Short Term: Able to explain why pulse checking is important during independent exercise;Long Term: Able to check pulse independently and accurately       Understanding of Exercise Prescription Yes       Intervention Provide education, explanation, and written  materials on patient's individual exercise prescription       Expected Outcomes Short Term: Able to explain program exercise prescription;Long Term: Able to explain home exercise prescription to exercise independently                Exercise Goals Re-Evaluation :    Discharge Exercise Prescription (Final Exercise Prescription Changes):   Nutrition:  Target Goals: Understanding of nutrition guidelines, daily intake of sodium '1500mg'$ , cholesterol '200mg'$ , calories 30% from fat and 7% or less from saturated fats, daily to have 5 or more servings of fruits and vegetables.  Biometrics:  Pre Biometrics - 07/27/22 1316       Pre Biometrics   Height '5\' 6"'$  (1.676 m)    Weight 239 lb 3.2 oz (108.5 kg)    Waist Circumference 45 inches    Hip Circumference 47.8 inches    Waist to Hip Ratio 0.94 %    BMI (Calculated) 38.63    Triceps Skinfold 45 mm    % Body Fat 50.2 %    Grip Strength 34.4 kg    Flexibility 0 in    Single Leg Stand 2.24 seconds              Nutrition Therapy Plan and Nutrition Goals:  Nutrition Therapy & Goals - 07/27/22 1316       Personal Nutrition Goals   Comments She reports only eating one small meal per day. She reports that some of her medicines make her sick on her stomach and she does not feel like eating. She did score an 84 on her MEDFICTs though. She is interested in a dietary referral.      Intervention Plan   Intervention Nutrition handout(s) given to patient.    Expected Outcomes Short Term Goal: Understand basic principles of dietary content, such as calories, fat, sodium, cholesterol and nutrients.;Short Term Goal: A plan has been developed with personal nutrition goals set during dietitian appointment.             Nutrition Assessments:  Nutrition Assessments - 07/27/22 1258       MEDFICTS Scores   Pre Score 84            MEDIFICTS Score Key: ?70 Need to make dietary changes  40-70 Heart Healthy Diet ? 40 Therapeutic  Level Cholesterol Diet   Picture Your Plate Scores: <26 Unhealthy dietary pattern with much room for improvement. 41-50 Dietary pattern unlikely to  meet recommendations for good health and room for improvement. 51-60 More healthful dietary pattern, with some room for improvement.  >60 Healthy dietary pattern, although there may be some specific behaviors that could be improved.    Nutrition Goals Re-Evaluation:   Nutrition Goals Discharge (Final Nutrition Goals Re-Evaluation):   Psychosocial: Target Goals: Acknowledge presence or absence of significant depression and/or stress, maximize coping skills, provide positive support system. Participant is able to verbalize types and ability to use techniques and skills needed for reducing stress and depression.  Initial Review & Psychosocial Screening:  Initial Psych Review & Screening - 07/27/22 1315       Initial Review   Current issues with Current Depression;Current Anxiety/Panic;Current Psychotropic Meds;Current Sleep Concerns      Family Dynamics   Good Support System? Yes    Comments Her daughter, son, and fiance are her support system.      Barriers   Psychosocial barriers to participate in program Psychosocial barriers identified (see note);The patient should benefit from training in stress management and relaxation.      Screening Interventions   Interventions Encouraged to exercise;Provide feedback about the scores to participant    Expected Outcomes Long Term Goal: Stressors or current issues are controlled or eliminated.;Short Term goal: Identification and review with participant of any Quality of Life or Depression concerns found by scoring the questionnaire.;Long Term goal: The participant improves quality of Life and PHQ9 Scores as seen by post scores and/or verbalization of changes;Short Term goal: Utilizing psychosocial counselor, staff and physician to assist with identification of specific Stressors or current issues  interfering with healing process. Setting desired goal for each stressor or current issue identified.             Quality of Life Scores:  Quality of Life - 07/27/22 1317       Quality of Life   Select Quality of Life      Quality of Life Scores   Health/Function Pre 20.4 %    Socioeconomic Pre 27.43 %    Psych/Spiritual Pre 26.57 %    Family Pre 30 %    GLOBAL Pre 24.53 %            Scores of 19 and below usually indicate a poorer quality of life in these areas.  A difference of  2-3 points is a clinically meaningful difference.  A difference of 2-3 points in the total score of the Quality of Life Index has been associated with significant improvement in overall quality of life, self-image, physical symptoms, and general health in studies assessing change in quality of life.  PHQ-9: Review Flowsheet  More data exists      07/27/2022 06/22/2022 02/16/2022 01/09/2022 12/05/2021  Depression screen PHQ 2/9  Decreased Interest '1 1 2 1 1 '$ 0  Down, Depressed, Hopeless '2 1 2 1 1 1  '$ PHQ - 2 Score '3 2 4 2 2 1  '$ Altered sleeping '2 1 2 2 '$ 0  Tired, decreased energy '2 1 2 2 1  '$ Change in appetite 0 1 0 2 0  Feeling bad or failure about yourself  2 1 0 2 1  Trouble concentrating '2 1 2 2 1  '$ Moving slowly or fidgety/restless '2 1 2 2 1  '$ Suicidal thoughts 2 0 0 0 0  PHQ-9 Score '15 8 12 14 6  '$ Difficult doing work/chores Somewhat difficult Very difficult Somewhat difficult - Somewhat difficult   Interpretation of Total Score  Total Score Depression Severity:  1-4 =  Minimal depression, 5-9 = Mild depression, 10-14 = Moderate depression, 15-19 = Moderately severe depression, 20-27 = Severe depression   Psychosocial Evaluation and Intervention:  Psychosocial Evaluation - 07/27/22 1343       Psychosocial Evaluation & Interventions   Interventions Stress management education;Relaxation education;Encouraged to exercise with the program and follow exercise prescription    Comments Pt has no  barriers to participate in CR. She does have current psychosocial issues of anxiety, depression, and sleep. She also has a history of panic attacks. She sees a psychiatrist for her mental health concerns. She currently takes Dietitian for depression/anxiety, fluoxetine for depression, and Sonata for insomnia. She reports that between the medicine and her psychiatry visits that her mental health concerns are managed well. When asked if she was stressed, she stated that she was not, but then later in orientation she mentioned that she stresses over everything. She had quit smoking back in December of 2023, but she recently started back smoking 7-8 cigs per day. She reports that the one year anniversary of her son's unexpected death is coming up and this led her to begin smoking again. She knows that she needs to quit, and plans to quit cold [place] again sometime in the future. She scored a 15 on her PHQ-9, and these answers are due to her depression, anxiety, and insomnia. She denies any suicidal idealation. She reports that she has a good support system with her son, daughter, and fiance. Her goals while in the program are to lose weight, strengthen her heart, and to be healthier. She is interested in improving her diet, so I will send a dietary referral. She is eager to begin the program.    Expected Outcomes Pt's depression, anxiety, and insomnia will continue to be managed medically and through pyschiatric visits. She will have no other psychosocial issues identified.    Continue Psychosocial Services  Follow up required by staff             Psychosocial Re-Evaluation:   Psychosocial Discharge (Final Psychosocial Re-Evaluation):   Vocational Rehabilitation: Provide vocational rehab assistance to qualifying candidates.   Vocational Rehab Evaluation & Intervention:  Vocational Rehab - 07/27/22 1334       Initial Vocational Rehab Evaluation & Intervention   Assessment shows need for Vocational  Rehabilitation No      Vocational Rehab Re-Evaulation   Comments retired             Education: Education Goals: Education classes will be provided on a weekly basis, covering required topics. Participant will state understanding/return demonstration of topics presented.  Learning Barriers/Preferences:  Learning Barriers/Preferences - 07/27/22 1321       Learning Barriers/Preferences   Learning Barriers None    Learning Preferences Audio;Individual Instruction;Skilled Demonstration;Verbal Instruction             Education Topics: Hypertension, Hypertension Reduction -Define heart disease and high blood pressure. Discus how high blood pressure affects the body and ways to reduce high blood pressure.   Exercise and Your Heart -Discuss why it is important to exercise, the FITT principles of exercise, normal and abnormal responses to exercise, and how to exercise safely.   Angina -Discuss definition of angina, causes of angina, treatment of angina, and how to decrease risk of having angina.   Cardiac Medications -Review what the following cardiac medications are used for, how they affect the body, and side effects that may occur when taking the medications.  Medications include Aspirin, Beta blockers, calcium channel blockers,  ACE Inhibitors, angiotensin receptor blockers, diuretics, digoxin, and antihyperlipidemics.   Congestive Heart Failure -Discuss the definition of CHF, how to live with CHF, the signs and symptoms of CHF, and how keep track of weight and sodium intake.   Heart Disease and Intimacy -Discus the effect sexual activity has on the heart, how changes occur during intimacy as we age, and safety during sexual activity.   Smoking Cessation / COPD -Discuss different methods to quit smoking, the health benefits of quitting smoking, and the definition of COPD.   Nutrition I: Fats -Discuss the types of cholesterol, what cholesterol does to the heart, and  how cholesterol levels can be controlled.   Nutrition II: Labels -Discuss the different components of food labels and how to read food label   Heart Parts/Heart Disease and PAD -Discuss the anatomy of the heart, the pathway of blood circulation through the heart, and these are affected by heart disease.   Stress I: Signs and Symptoms -Discuss the causes of stress, how stress may lead to anxiety and depression, and ways to limit stress.   Stress II: Relaxation -Discuss different types of relaxation techniques to limit stress.   Warning Signs of Stroke / TIA -Discuss definition of a stroke, what the signs and symptoms are of a stroke, and how to identify when someone is having stroke.   Knowledge Questionnaire Score:  Knowledge Questionnaire Score - 07/27/22 1254       Knowledge Questionnaire Score   Pre Score 15/28             Core Components/Risk Factors/Patient Goals at Admission:  Personal Goals and Risk Factors at Admission - 07/27/22 1334       Core Components/Risk Factors/Patient Goals on Admission    Weight Management Yes    Intervention Weight Management: Develop a combined nutrition and exercise program designed to reach desired caloric intake, while maintaining appropriate intake of nutrient and fiber, sodium and fats, and appropriate energy expenditure required for the weight goal.;Weight Management: Provide education and appropriate resources to help participant work on and attain dietary goals.;Weight Management/Obesity: Establish reasonable short term and long term weight goals.;Obesity: Provide education and appropriate resources to help participant work on and attain dietary goals.    Expected Outcomes Short Term: Continue to assess and modify interventions until short term weight is achieved;Long Term: Adherence to nutrition and physical activity/exercise program aimed toward attainment of established weight goal;Weight Maintenance: Understanding of the  daily nutrition guidelines, which includes 25-35% calories from fat, 7% or less cal from saturated fats, less than '200mg'$  cholesterol, less than 1.5gm of sodium, & 5 or more servings of fruits and vegetables daily;Weight Loss: Understanding of general recommendations for a balanced deficit meal plan, which promotes 1-2 lb weight loss per week and includes a negative energy balance of [phone] kcal/d;Understanding recommendations for meals to include 15-35% energy as protein, 25-35% energy from fat, 35-60% energy from carbohydrates, less than '200mg'$  of dietary cholesterol, 20-35 gm of total fiber daily;Understanding of distribution of calorie intake throughout the day with the consumption of 4-5 meals/snacks    Tobacco Cessation Yes    Number of packs per day 1/3    Intervention Assist the participant in steps to quit. Provide individualized education and counseling about committing to Tobacco Cessation, relapse prevention, and pharmacological support that can be provided by physician.;Advice worker, assist with locating and accessing local/national Quit Smoking programs, and support quit date choice.    Expected Outcomes Short Term: Will demonstrate readiness to  quit, by selecting a quit date.;Short Term: Will quit all tobacco product use, adhering to prevention of relapse plan.;Long Term: Complete abstinence from all tobacco products for at least 12 months from quit date.    Improve shortness of breath with ADL's Yes    Intervention Provide education, individualized exercise plan and daily activity instruction to help decrease symptoms of SOB with activities of daily living.    Expected Outcomes Short Term: Improve cardiorespiratory fitness to achieve a reduction of symptoms when performing ADLs;Long Term: Be able to perform more ADLs without symptoms or delay the onset of symptoms    Heart Failure Yes    Intervention Provide a combined exercise and nutrition program that is supplemented with  education, support and counseling about heart failure. Directed toward relieving symptoms such as shortness of breath, decreased exercise tolerance, and extremity edema.    Expected Outcomes Improve functional capacity of life;Short term: Attendance in program 2-3 days a week with increased exercise capacity. Reported lower sodium intake. Reported increased fruit and vegetable intake. Reports medication compliance.;Short term: Daily weights obtained and reported for increase. Utilizing diuretic protocols set by physician.;Long term: Adoption of self-care skills and reduction of barriers for early signs and symptoms recognition and intervention leading to self-care maintenance.    Hypertension Yes    Intervention Provide education on lifestyle modifcations including regular physical activity/exercise, weight management, moderate sodium restriction and increased consumption of fresh fruit, vegetables, and low fat dairy, alcohol moderation, and smoking cessation.;Monitor prescription use compliance.    Expected Outcomes Short Term: Continued assessment and intervention until BP is < 140/60m HG in hypertensive participants. < 130/833mHG in hypertensive participants with diabetes, heart failure or chronic kidney disease.;Long Term: Maintenance of blood pressure at goal levels.    Lipids Yes    Intervention Provide education and support for participant on nutrition & aerobic/resistive exercise along with prescribed medications to achieve LDL '70mg'$ , HDL >'40mg'$ .    Expected Outcomes Short Term: Participant states understanding of desired cholesterol values and is compliant with medications prescribed. Participant is following exercise prescription and nutrition guidelines.;Long Term: Cholesterol controlled with medications as prescribed, with individualized exercise RX and with personalized nutrition plan. Value goals: LDL < '70mg'$ , HDL > 40 mg.    Personal Goal Other Yes    Personal Goal Improve heart funtion and  be healthier overall.    Intervention Attend CR three days per week and begin a home exercise program.    Expected Outcomes Pt will meet stated goals.             Core Components/Risk Factors/Patient Goals Review:    Core Components/Risk Factors/Patient Goals at Discharge (Final Review):    ITP Comments:   Comments: Patient arrived for 1st visit/orientation/education at 1230. Patient was referred to CR by Dr. PoFlorene Glenue to S/P coronary artery stent placement (Z95.5). During orientation advised patient on arrival and appointment times what to wear, what to do before, during and after exercise. Reviewed attendance and class policy.  Pt is scheduled to return Cardiac Rehab on 07/31/2022 at 1445. Pt was advised to come to class 15 minutes before class starts.  Discussed RPE/Dpysnea scales. Patient participated in warm up stretches. Patient was able to complete 6 minute walk test.  Telemetry: NSR. Patient was measured for the equipment. Discussed equipment safety with patient. Took patient pre-anthropometric measurements. Patient finished visit at 1340.

## 2022-07-31 ENCOUNTER — Encounter (HOSPITAL_COMMUNITY): Payer: 59

## 2022-08-01 ENCOUNTER — Other Ambulatory Visit: Payer: Self-pay

## 2022-08-01 MED ORDER — CLOPIDOGREL BISULFATE 75 MG PO TABS: 75.0000 mg | ORAL_TABLET | Freq: Every day | ORAL | 1 refills | Status: DC

## 2022-08-01 MED ORDER — ROSUVASTATIN CALCIUM 40 MG PO TABS: 40.0000 mg | ORAL_TABLET | Freq: Every day | ORAL | 1 refills | Status: DC

## 2022-08-01 MED ORDER — PANTOPRAZOLE SODIUM 40 MG PO TBEC: 40.0000 mg | DELAYED_RELEASE_TABLET | Freq: Every day | ORAL | 1 refills | Status: DC

## 2022-08-01 NOTE — Telephone Encounter (Signed)
Patient made aware of results.  Results faxed to pcp. Patient states that she was seen at [facility] on 06/27/22 and was given clopidogrel 75 mg, rosuvastatin 40 mg and pantoprazole 40 mg. States that she has tried to contact the hospital for refills but has been unable to get anyone to return her call. Would like to know if she can get these medications refilled? Please advise

## 2022-08-01 NOTE — Telephone Encounter (Signed)
-----   Message from Laurine Blazer, LPN sent at 10/21/1362  2:01 PM EST -----  ----- Message ----- From: Merlene Laughter, RN Sent: 07/31/2022   2:04 PM EST To: Laurine Blazer, LPN   ----- Message ----- From: Finis Bud, NP Sent: 07/31/2022   1:56 PM EST To: Merlene Laughter, RN  Labs are overall stable. Her one liver function enzymes (ALT) and kidney function are minimally elevated. I recommend she continues to follow-up with herb PCP. Please fax over lab results to PCP's office.   Thanks!  Finis Bud, AGNP-C

## 2022-08-01 NOTE — Telephone Encounter (Signed)
Per Finis Bud NP, prescriptions sent to pharmacy. Patient made aware.

## 2022-08-02 ENCOUNTER — Encounter (HOSPITAL_COMMUNITY)
Admission: RE | Admit: 2022-08-02 | Discharge: 2022-08-02 | Disposition: A | Discharge status: Home / Self Care | Payer: 59 | Source: Ambulatory Visit | Attending: Internal Medicine | Admitting: Internal Medicine

## 2022-08-02 DIAGNOSIS — Z955 Presence of coronary angioplasty implant and graft: Secondary | ICD-10-CM | POA: Diagnosis not present

## 2022-08-02 LAB — GLUCOSE, CAPILLARY: Glucose-Capillary: 145 mg/dL — ABNORMAL HIGH (ref 70–99)

## 2022-08-02 NOTE — Progress Notes (Signed)
Daily Session Note  Patient Details  Name: Ashley Patrick MRN: [id number] Date of Birth: 1956/03/12 Referring Provider:   Flowsheet Row CARDIAC REHAB PHASE II ORIENTATION from 07/27/2022 in [place]  Referring Provider Dr. Florene Glen       Encounter Date: 08/02/2022  Check In:  Session Check In - 08/02/22 1445       Check-In   Supervising physician immediately available to respond to emergencies [facility] MD immediately available    Physician(s) Dr. Domenic Polite    Location [facility]    Staff Present Hoy Register MHA, MS, ACSM-CEP;Bayan Hedstrom BSN, RN;Daphyne Hassell Done, RN, BSN    Virtual Visit No    Medication changes reported     No    Fall or balance concerns reported    Yes    Comments She has two falls this year. She admits that her balance is not good. 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    Tobacco Cessation Use Increase   pt had increased to smoking 7-8 cigarettes per day after previously stopping smoking   Current number of cigarettes/nicotine per day     7    Warm-up and Cool-down Performed as group-led instruction    Resistance Training Performed Yes    VAD Patient? No    PAD/SET Patient? No      Pain Assessment   Currently in Pain? No/denies    Multiple Pain Sites No             Capillary Blood Glucose: Results for orders placed or performed during the hospital encounter of 08/02/22 (from the past 24 hour(s))  Glucose, capillary     Status: Abnormal   Collection Time: 08/02/22  2:33 PM  Result Value Ref Range   Glucose-Capillary 145 (H) 70 - 99 mg/dL      Social History   Tobacco Use  Smoking Status Former   Packs/day: 0.50   Years: 38.00   Total pack years: 19.00   Types: Cigarettes   Quit date: 06/19/2022   Years since quitting: 0.1  Smokeless Tobacco Never  Tobacco Comments   States she has not smoked this week and is attempting to quit.     Goals Met:  Independence with  exercise equipment Exercise tolerated well No report of concerns or symptoms today Strength training completed today  Goals Unmet:  Not Applicable  Comments: check out at 15:45   Dr. Carlyle Dolly is Medical Director for [facility]

## 2022-08-04 ENCOUNTER — Encounter (HOSPITAL_COMMUNITY)
Admission: RE | Admit: 2022-08-04 | Discharge: 2022-08-04 | Disposition: A | Discharge status: Home / Self Care | Payer: 59 | Source: Ambulatory Visit | Attending: Internal Medicine | Admitting: Internal Medicine

## 2022-08-04 DIAGNOSIS — Z955 Presence of coronary angioplasty implant and graft: Secondary | ICD-10-CM | POA: Diagnosis not present

## 2022-08-04 NOTE — Progress Notes (Signed)
Daily Session Note  Patient Details  Name: Ashley Patrick MRN: [id number] Date of Birth: Jun 30, 1955 Referring Provider:   Flowsheet Row CARDIAC REHAB PHASE II ORIENTATION from 07/27/2022 in [place]  Referring Provider Dr. Florene Glen       Encounter Date: 08/04/2022  Check In:  Session Check In - 08/04/22 1434       Check-In   Supervising physician immediately available to respond to emergencies [facility] immediately available    Physician(s) Dr Johnsie Cancel    Location [facility]    Staff Present Leana Roe, BS, Exercise Physiologist;Phyllis Billingsley, Kermit Balo, RN, Joanette Gula, RN, BSN    Virtual Visit No    Medication changes reported     No    Fall or balance concerns reported    Yes    Comments She has two falls this year. She admits that her balance is not good. 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    Tobacco Cessation No Change    Warm-up and Cool-down Performed as group-led instruction    Resistance Training Performed Yes    VAD Patient? No    PAD/SET Patient? No      Pain Assessment   Currently in Pain? No/denies    Multiple Pain Sites No             Capillary Blood Glucose: No results found for this or any previous visit (from the past 24 hour(s)).    Social History   Tobacco Use  Smoking Status Former   Packs/day: 0.50   Years: 38.00   Total pack years: 19.00   Types: Cigarettes   Quit date: 06/19/2022   Years since quitting: 0.1  Smokeless Tobacco Never  Tobacco Comments   States she has not smoked this week and is attempting to quit.     Goals Met:  Independence with exercise equipment Exercise tolerated well No report of concerns or symptoms today Strength training completed today  Goals Unmet:  Not Applicable  Comments: Checkout at 1545.   Dr. Carlyle Dolly is Medical Director for [facility]

## 2022-08-07 ENCOUNTER — Encounter (HOSPITAL_COMMUNITY)
Admission: RE | Admit: 2022-08-07 | Discharge: 2022-08-07 | Disposition: A | Discharge status: Home / Self Care | Payer: 59 | Source: Ambulatory Visit | Attending: Internal Medicine | Admitting: Internal Medicine

## 2022-08-07 DIAGNOSIS — Z955 Presence of coronary angioplasty implant and graft: Secondary | ICD-10-CM | POA: Diagnosis not present

## 2022-08-07 LAB — GLUCOSE, CAPILLARY: Glucose-Capillary: 109 mg/dL — ABNORMAL HIGH (ref 70–99)

## 2022-08-07 NOTE — Progress Notes (Signed)
Daily Session Note  Patient Details  Name: Ashley Patrick MRN: [id number] Date of Birth: [birth year] Referring Provider:   Flowsheet Row CARDIAC REHAB PHASE II ORIENTATION from 07/27/2022 in [place]  Referring Provider Dr. Florene Glen       Encounter Date: 08/07/2022  Check In:  Session Check In - 08/07/22 1445       Check-In   Supervising physician immediately available to respond to emergencies [facility] MD immediately available    Physician(s) Dr. Dellia Cloud    Location [facility]    Staff Present Leana Roe, BS, Exercise Physiologist;Daphyne Hassell Done, RN, BSN    Virtual Visit No    Medication changes reported     No    Fall or balance concerns reported    Yes    Comments She has two falls this year. She admits that her balance is not good. 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    Tobacco Cessation No Change    Warm-up and Cool-down Performed as group-led instruction    Resistance Training Performed Yes    VAD Patient? No    PAD/SET Patient? No      Pain Assessment   Currently in Pain? No/denies    Multiple Pain Sites No             Capillary Blood Glucose: Results for orders placed or performed during the hospital encounter of 08/07/22 (from the past 24 hour(s))  Glucose, capillary     Status: Abnormal   Collection Time: 08/07/22  2:35 PM  Result Value Ref Range   Glucose-Capillary 109 (H) 70 - 99 mg/dL      Social History   Tobacco Use  Smoking Status Former   Packs/day: 0.50   Years: 38.00   Total pack years: 19.00   Types: Cigarettes   Quit date: 06/19/2022   Years since quitting: 0.1  Smokeless Tobacco Never  Tobacco Comments   States she has not smoked this week and is attempting to quit.     Goals Met:  Independence with exercise equipment Exercise tolerated well No report of concerns or symptoms today Strength training completed today  Goals Unmet:  Not  Applicable  Comments: check out 1545   Dr. Carlyle Dolly is Medical Director for [place]

## 2022-08-08 ENCOUNTER — Telehealth: Payer: Self-pay | Admitting: Psychiatry

## 2022-08-08 NOTE — Telephone Encounter (Signed)
Medication was ordered as below for 90 days. Please verify with the pharmacy.    Disp Refills Start End  cariprazine (VRAYLAR) 1.5 MG capsule 90 capsule 0 08/10/2022 11/08/2022  Sig - Route: Take 1 capsule (1.5 mg total) by mouth daily. - Oral  Sent to pharmacy as: cariprazine (VRAYLAR) 1.5 MG capsule  Notes to Pharmacy: Fill after 2/15  E-Prescribing Status: Receipt confirmed by pharmacy (06/22/2022 10:58 AM EST)

## 2022-08-08 NOTE — Telephone Encounter (Signed)
Patient states she has no refills on medication Vraylar 1.5 mg, and wants to know if she can get a 90 day supply. Pharmacy is [facility] in [place]. Thank you

## 2022-08-09 ENCOUNTER — Encounter (HOSPITAL_COMMUNITY)
Admission: RE | Admit: 2022-08-09 | Discharge: 2022-08-09 | Disposition: A | Discharge status: Home / Self Care | Payer: 59 | Source: Ambulatory Visit | Attending: Internal Medicine | Admitting: Internal Medicine

## 2022-08-09 DIAGNOSIS — Z955 Presence of coronary angioplasty implant and graft: Secondary | ICD-10-CM | POA: Diagnosis not present

## 2022-08-09 NOTE — Progress Notes (Signed)
Daily Session Note  Patient Details  Name: Ashley Patrick MRN: [id number] Date of Birth: 04-Oct-1955 Referring Provider:   Flowsheet Row CARDIAC REHAB PHASE II ORIENTATION from 07/27/2022 in [employer]  Referring Provider Dr. Florene Glen       Encounter Date: 08/09/2022  Check In:  Session Check In - 08/09/22 1445       Check-In   Supervising physician immediately available to respond to emergencies [facility] immediately available    Physician(s) Dr. Dellia Cloud    Location [facility]    Staff Present Hoy Register MHA, MS, ACSM-CEP;Leana Roe, BS, Exercise Physiologist    Virtual Visit No    Medication changes reported     No    Fall or balance concerns reported    Yes    Comments She has two falls this year. She admits that her balance is not good. 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    Tobacco Cessation No Change    Current number of cigarettes/nicotine per day     7    Warm-up and Cool-down Performed as group-led instruction    Resistance Training Performed Yes    VAD Patient? No    PAD/SET Patient? No      Pain Assessment   Currently in Pain? No/denies    Multiple Pain Sites No             Capillary Blood Glucose: No results found for this or any previous visit (from the past 24 hour(s)).    Social History   Tobacco Use  Smoking Status Former   Packs/day: 0.50   Years: 38.00   Total pack years: 19.00   Types: Cigarettes   Quit date: 06/19/2022   Years since quitting: 0.1  Smokeless Tobacco Never  Tobacco Comments   States she has not smoked this week and is attempting to quit.     Goals Met:  Independence with exercise equipment Exercise tolerated well No report of concerns or symptoms today Strength training completed today  Goals Unmet:  Not Applicable  Comments: checkout time is 1545   Dr. Carlyle Dolly is Medical Director for [place] [facility]

## 2022-08-11 ENCOUNTER — Encounter (HOSPITAL_COMMUNITY): Payer: 59

## 2022-08-11 NOTE — Telephone Encounter (Signed)
message was left that medication was sent.

## 2022-08-14 ENCOUNTER — Encounter (HOSPITAL_COMMUNITY)
Admission: RE | Admit: 2022-08-14 | Discharge: 2022-08-14 | Disposition: A | Discharge status: Home / Self Care | Payer: 59 | Source: Ambulatory Visit | Attending: Internal Medicine | Admitting: Internal Medicine

## 2022-08-14 VITALS — Wt 244.3 lb

## 2022-08-14 DIAGNOSIS — Z955 Presence of coronary angioplasty implant and graft: Secondary | ICD-10-CM | POA: Diagnosis not present

## 2022-08-14 NOTE — Progress Notes (Signed)
Daily Session Note  Patient Details  Name: Ashley Patrick MRN: [id number] Date of Birth: 1956-03-25 Referring Provider:   Flowsheet Row CARDIAC REHAB PHASE II ORIENTATION from 07/27/2022 in [place]  Referring Provider Dr. Florene Glen       Encounter Date: 08/14/2022  Check In:  Session Check In - 08/14/22 1442       Check-In   Supervising physician immediately available to respond to emergencies [facility] MD immediately available    Physician(s) Dr Harl Bowie    Location [facility]    Staff Present Leana Roe, BS, Exercise Physiologist;Seanna Sisler Hassell Done, RN, BSN    Virtual Visit No    Medication changes reported     No    Fall or balance concerns reported    Yes    Comments She has two falls this year. She admits that her balance is not good. 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    Tobacco Cessation No Change    Warm-up and Cool-down Performed as group-led instruction    Resistance Training Performed Yes    VAD Patient? No    PAD/SET Patient? No      Pain Assessment   Currently in Pain? No/denies    Multiple Pain Sites No             Capillary Blood Glucose: No results found for this or any previous visit (from the past 24 hour(s)).    Social History   Tobacco Use  Smoking Status Former   Packs/day: 0.50   Years: 38.00   Total pack years: 19.00   Types: Cigarettes   Quit date: 06/19/2022   Years since quitting: 0.1  Smokeless Tobacco Never  Tobacco Comments   States she has not smoked this week and is attempting to quit.     Goals Met:  Independence with exercise equipment Exercise tolerated well No report of concerns or symptoms today Strength training completed today  Goals Unmet:  Not Applicable  Comments: Checkout at 1545.   Dr. Carlyle Dolly is Medical Director for [facility]

## 2022-08-16 ENCOUNTER — Encounter (HOSPITAL_COMMUNITY)
Admission: RE | Admit: 2022-08-16 | Discharge: 2022-08-16 | Disposition: A | Discharge status: Home / Self Care | Payer: 59 | Source: Ambulatory Visit | Attending: Internal Medicine | Admitting: Internal Medicine

## 2022-08-16 DIAGNOSIS — Z955 Presence of coronary angioplasty implant and graft: Secondary | ICD-10-CM | POA: Diagnosis not present

## 2022-08-16 NOTE — Progress Notes (Signed)
Cardiac Individual Treatment Plan  Patient Details  Name: Ashley Patrick MRN: [id number] Date of Birth: 01-29-1956 Referring Provider:   Flowsheet Row CARDIAC REHAB PHASE II ORIENTATION from 07/27/2022 in [facility]  Referring Provider Dr. Florene Glen       Initial Encounter Date:  Flowsheet Row CARDIAC REHAB PHASE II ORIENTATION from 07/27/2022 in [facility]  Date 07/27/22       Visit Diagnosis: S/P coronary artery stent placement  Patient's Home Medications on Admission:  Current Outpatient Medications:    acetaminophen (TYLENOL) 650 MG CR tablet, Take 650 mg by mouth every 8 (eight) hours as needed for pain., Disp: , Rfl:    albuterol (VENTOLIN HFA) 108 (90 Base) MCG/ACT inhaler, Inhale 2 puffs into the lungs every 4 (four) hours as needed for wheezing or shortness of breath., Disp: , Rfl:    amLODipine (NORVASC) 5 MG tablet, Take 5 mg by mouth every morning., Disp: , Rfl:    aspirin EC 81 MG tablet, Take 81 mg by mouth daily. Swallow whole., Disp: , Rfl:    cariprazine (VRAYLAR) 1.5 MG capsule, Take 1 capsule (1.5 mg total) by mouth daily., Disp: 90 capsule, Rfl: 0   celecoxib (CELEBREX) 100 MG capsule, Take 100 mg by mouth 2 (two) times daily as needed for mild pain., Disp: , Rfl:    clopidogrel (PLAVIX) 75 MG tablet, Take 1 tablet (75 mg total) by mouth daily with breakfast., Disp: 90 tablet, Rfl: 1   furosemide (LASIX) 80 MG tablet, Take 1 tablet (80 mg total) by mouth in the morning. & 80 mg at 5:00 pm, Disp: 60 tablet, Rfl: 3   metFORMIN (GLUCOPHAGE) 500 MG tablet, Take 500 mg by mouth every morning., Disp: , Rfl:    metoprolol succinate (TOPROL-XL) 50 MG 24 hr tablet, Take 1 tablet (50 mg total) by mouth daily., Disp: 30 tablet, Rfl: 6   Multiple Vitamins-Minerals (MULTIVITAMIN WITH MINERALS) tablet, Take 1 tablet by mouth daily., Disp: , Rfl:    olmesartan (BENICAR) 5 MG tablet, Take 1 tablet (5 mg total) by mouth daily., Disp: 30  tablet, Rfl: 0   pantoprazole (PROTONIX) 40 MG tablet, Take 1 tablet (40 mg total) by mouth daily., Disp: 90 tablet, Rfl: 1   potassium chloride SA (KLOR-CON M) 20 MEQ tablet, Take 1 tablet (20 mEq total) by mouth daily., Disp: 30 tablet, Rfl: 3   rosuvastatin (CRESTOR) 40 MG tablet, Take 1 tablet (40 mg total) by mouth daily., Disp: 90 tablet, Rfl: 1   SPIRIVA RESPIMAT 1.25 MCG/ACT AERS, Inhale 2 puffs into the lungs daily., Disp: , Rfl:    tetrabenazine (XENAZINE) 12.5 MG tablet, Take 1 tablet (12.5 mg total) by mouth 2 (two) times daily., Disp: 180 tablet, Rfl: 0   traZODone (DESYREL) 150 MG tablet, Take 300 mg by mouth at bedtime., Disp: , Rfl:    vitamin B-12 (CYANOCOBALAMIN) 1000 MCG tablet, Take 1,000 mcg by mouth daily., Disp: , Rfl:    zaleplon (SONATA) 5 MG capsule, Take 1 capsule (5 mg total) by mouth at bedtime as needed for sleep., Disp: 30 capsule, Rfl: 2  Past Medical History: Past Medical History:  Diagnosis Date   Anxiety    Aortic atherosclerosis ([place])    Arthritis    Asthma    Back pain    Bipolar disorder ([place])    Cancer ([place]) 2014   Colon Cancer   Constipation    Depression    Dyspnea    Hypercholesteremia  Hypertension    Insomnia    Lateral epicondylitis  of elbow    right   Migraine headache    Nicotine addiction    Obesity    History of   OD (overdose of drug)    hospitalized for 11 days    Panic attacks    Psychosis ([place])    Schizophrenia ([place])     Tobacco Use: Social History   Tobacco Use  Smoking Status Former   Packs/day: 0.50   Years: 38.00   Total pack years: 19.00   Types: Cigarettes   Quit date: 06/19/2022   Years since quitting: 0.1  Smokeless Tobacco Never  Tobacco Comments   States she has not smoked this week and is attempting to quit.     Labs: Review Flowsheet  More data exists      Latest Ref Rng & Units 04/24/2022 06/28/2022 06/29/2022 06/30/2022 07/25/2022  Labs for ITP Cardiac and Pulmonary Rehab  Cholestrol 100 - 199  mg/dL 258  156  - - 138   LDL (calc) 0 - 99 mg/dL 172  80  - - 65   HDL-C >39 mg/dL 50  53  - - 52   Trlycerides 0 - 149 mg/dL 178  116  - - 120   Hemoglobin A1c 4.8 - 5.6 % - - - 5.8  -  PH, Arterial 7.35 - 7.45 - - 7.351  - -  PCO2 arterial 32 - 48 mmHg - - 48.3  - -  Bicarbonate 20.0 - 28.0 mmol/L 20.0 - 28.0 mmol/L - - 28.8  29.6  26.8  - -  TCO2 22 - 32 mmol/L 22 - 32 mmol/L - - '30  31  28  '$ - -  O2 Saturation % % - - 65  65  89  - -    Capillary Blood Glucose: Lab Results  Component Value Date   GLUCAP 109 (H) 08/07/2022   GLUCAP 145 (H) 08/02/2022   GLUCAP 114 (H) 07/27/2022   GLUCAP 90 06/30/2022   GLUCAP 92 06/30/2022    POCT Glucose     Row Name 07/27/22 1304             POCT Blood Glucose   Pre-Exercise 114 mg/dL                Exercise Target Goals: Exercise Program Goal: Individual exercise prescription set using results from initial 6 min walk test and THRR while considering  patient's activity barriers and safety.   Exercise Prescription Goal: Starting with aerobic activity 30 plus minutes a day, 3 days per week for initial exercise prescription. Provide home exercise prescription and guidelines that participant acknowledges understanding prior to discharge.  Activity Barriers & Risk Stratification:  Activity Barriers & Cardiac Risk Stratification - 07/27/22 1312       Activity Barriers & Cardiac Risk Stratification   Activity Barriers Arthritis;Joint Problems;Deconditioning;Shortness of Breath;Balance Concerns;History of Falls;Assistive Device    Cardiac Risk Stratification High             6 Minute Walk:  6 Minute Walk     Row Name 07/27/22 1314         6 Minute Walk   Phase Initial     Distance 1200 feet     Walk Time 6 minutes     # of Rest Breaks 0     MPH 2.27     METS 2.54     RPE 14     VO2 Peak  8.89     Symptoms Yes (comment)     Comments Slight SOB while walking     Resting HR 88 bpm     Resting BP 120/70      Resting Oxygen Saturation  99 %     Exercise Oxygen Saturation  during 6 min walk 98 %     Max Ex. HR 106 bpm     Max Ex. BP 140/70     2 Minute Post BP 132/70              Oxygen Initial Assessment:   Oxygen Re-Evaluation:   Oxygen Discharge (Final Oxygen Re-Evaluation):   Initial Exercise Prescription:  Initial Exercise Prescription - 07/27/22 1300       Date of Initial Exercise RX and Referring Provider   Date 07/27/22    Referring Provider Dr. Florene Glen    Expected Discharge Date 10/20/22      Treadmill   MPH 1.8    Grade 0    Minutes 17      NuStep   Level 1    SPM 60    Minutes 22      Prescription Details   Frequency (times per week) 3    Duration Progress to 30 minutes of continuous aerobic without signs/symptoms of physical distress      Intensity   THRR 40-80% of Max Heartrate 62-125    Ratings of Perceived Exertion 11-13    Perceived Dyspnea 0-4      Resistance Training   Training Prescription Yes    Weight 3    Reps 10-15             Perform Capillary Blood Glucose checks as needed.  Exercise Prescription Changes:   Exercise Prescription Changes     Row Name 08/14/22 1500             Response to Exercise   Blood Pressure (Admit) 134/76       Blood Pressure (Exercise) 152/68       Blood Pressure (Exit) 128/60       Heart Rate (Admit) 94 bpm       Heart Rate (Exercise) 125 bpm       Heart Rate (Exit) 103 bpm       Rating of Perceived Exertion (Exercise) 13       Duration Continue with 30 min of aerobic exercise without signs/symptoms of physical distress.       Intensity THRR unchanged         Progression   Progression Continue to progress workloads to maintain intensity without signs/symptoms of physical distress.         Resistance Training   Training Prescription Yes       Weight 3       Reps 10-15       Time 10 Minutes         Treadmill   MPH 2.3       Grade 0       Minutes 17       METs 2.76         NuStep    Level 1       SPM 109       Minutes 22       METs 2.84                Exercise Comments:   Exercise Goals and Review:   Exercise Goals     Row Name 07/27/22 1316 08/14/22 1500  Exercise Goals   Increase Physical Activity Yes Yes      Intervention Provide advice, education, support and counseling about physical activity/exercise needs.;Develop an individualized exercise prescription for aerobic and resistive training based on initial evaluation findings, risk stratification, comorbidities and participant's personal goals. Provide advice, education, support and counseling about physical activity/exercise needs.;Develop an individualized exercise prescription for aerobic and resistive training based on initial evaluation findings, risk stratification, comorbidities and participant's personal goals.      Expected Outcomes Short Term: Attend rehab on a regular basis to increase amount of physical activity.;Long Term: Add in home exercise to make exercise part of routine and to increase amount of physical activity.;Long Term: Exercising regularly at least 3-5 days a week. Short Term: Attend rehab on a regular basis to increase amount of physical activity.;Long Term: Add in home exercise to make exercise part of routine and to increase amount of physical activity.;Long Term: Exercising regularly at least 3-5 days a week.      Increase Strength and Stamina Yes Yes      Intervention Provide advice, education, support and counseling about physical activity/exercise needs.;Develop an individualized exercise prescription for aerobic and resistive training based on initial evaluation findings, risk stratification, comorbidities and participant's personal goals. Provide advice, education, support and counseling about physical activity/exercise needs.;Develop an individualized exercise prescription for aerobic and resistive training based on initial evaluation findings, risk stratification,  comorbidities and participant's personal goals.      Expected Outcomes Short Term: Increase workloads from initial exercise prescription for resistance, speed, and METs.;Short Term: Perform resistance training exercises routinely during rehab and add in resistance training at home;Long Term: Improve cardiorespiratory fitness, muscular endurance and strength as measured by increased METs and functional capacity (6MWT) Short Term: Increase workloads from initial exercise prescription for resistance, speed, and METs.;Short Term: Perform resistance training exercises routinely during rehab and add in resistance training at home;Long Term: Improve cardiorespiratory fitness, muscular endurance and strength as measured by increased METs and functional capacity (6MWT)      Able to understand and use rate of perceived exertion (RPE) scale Yes Yes      Intervention Provide education and explanation on how to use RPE scale Provide education and explanation on how to use RPE scale      Expected Outcomes Long Term:  Able to use RPE to guide intensity level when exercising independently;Short Term: Able to use RPE daily in rehab to express subjective intensity level Long Term:  Able to use RPE to guide intensity level when exercising independently;Short Term: Able to use RPE daily in rehab to express subjective intensity level      Knowledge and understanding of Target Heart Rate Range (THRR) Yes Yes      Intervention Provide education and explanation of THRR including how the numbers were predicted and where they are located for reference Provide education and explanation of THRR including how the numbers were predicted and where they are located for reference      Expected Outcomes Short Term: Able to state/look up THRR;Short Term: Able to use daily as guideline for intensity in rehab;Long Term: Able to use THRR to govern intensity when exercising independently Short Term: Able to state/look up THRR;Short Term: Able to  use daily as guideline for intensity in rehab;Long Term: Able to use THRR to govern intensity when exercising independently      Able to check pulse independently Yes Yes      Intervention Provide education and demonstration on how to check  pulse in carotid and radial arteries.;Review the importance of being able to check your own pulse for safety during independent exercise Provide education and demonstration on how to check pulse in carotid and radial arteries.;Review the importance of being able to check your own pulse for safety during independent exercise      Expected Outcomes Short Term: Able to explain why pulse checking is important during independent exercise;Long Term: Able to check pulse independently and accurately Short Term: Able to explain why pulse checking is important during independent exercise;Long Term: Able to check pulse independently and accurately      Understanding of Exercise Prescription Yes Yes      Intervention Provide education, explanation, and written materials on patient's individual exercise prescription Provide education, explanation, and written materials on patient's individual exercise prescription      Expected Outcomes Short Term: Able to explain program exercise prescription;Long Term: Able to explain home exercise prescription to exercise independently Short Term: Able to explain program exercise prescription;Long Term: Able to explain home exercise prescription to exercise independently               Exercise Goals Re-Evaluation :  Exercise Goals Re-Evaluation     Row Name 08/14/22 1500             Exercise Goal Re-Evaluation   Exercise Goals Review Increase Physical Activity;Increase Strength and Stamina;Able to understand and use rate of perceived exertion (RPE) scale;Knowledge and understanding of Target Heart Rate Range (THRR);Able to check pulse independently;Understanding of Exercise Prescription       Comments Pt has completed 5 sessions of  cardiac rehab. She enjoys coming to class and is eager to improve her health. She has recently pushed herself too hard in class and was asked to slow down on the treadmill and stepper due to her HR going over her THR. She is currently exercising at 2.84 METs on the stepper. Will continue to monitor and progress as able.       Expected Outcomes Through exercise at rehab and home, the patient will meet thier stated goals                 Discharge Exercise Prescription (Final Exercise Prescription Changes):  Exercise Prescription Changes - 08/14/22 1500       Response to Exercise   Blood Pressure (Admit) 134/76    Blood Pressure (Exercise) 152/68    Blood Pressure (Exit) 128/60    Heart Rate (Admit) 94 bpm    Heart Rate (Exercise) 125 bpm    Heart Rate (Exit) 103 bpm    Rating of Perceived Exertion (Exercise) 13    Duration Continue with 30 min of aerobic exercise without signs/symptoms of physical distress.    Intensity THRR unchanged      Progression   Progression Continue to progress workloads to maintain intensity without signs/symptoms of physical distress.      Resistance Training   Training Prescription Yes    Weight 3    Reps 10-15    Time 10 Minutes      Treadmill   MPH 2.3    Grade 0    Minutes 17    METs 2.76      NuStep   Level 1    SPM 109    Minutes 22    METs 2.84             Nutrition:  Target Goals: Understanding of nutrition guidelines, daily intake of sodium '1500mg'$ , cholesterol '200mg'$ , calories 30% from  fat and 7% or less from saturated fats, daily to have 5 or more servings of fruits and vegetables.  Biometrics:  Pre Biometrics - 07/27/22 1316       Pre Biometrics   Height '5\' 6"'$  (1.676 m)    Weight 108.5 kg    Waist Circumference 45 inches    Hip Circumference 47.8 inches    Waist to Hip Ratio 0.94 %    BMI (Calculated) 38.63    Triceps Skinfold 45 mm    % Body Fat 50.2 %    Grip Strength 34.4 kg    Flexibility 0 in    Single Leg  Stand 2.24 seconds              Nutrition Therapy Plan and Nutrition Goals:  Nutrition Therapy & Goals - 08/07/22 1408       Personal Nutrition Goals   Comments Patient's diet assessment score was 84. We have educational sessions on heart healthy nutrition with handouts and offer assistance with RD referral if patient is interested.      Intervention Plan   Intervention Nutrition handout(s) given to patient.    Expected Outcomes Short Term Goal: Understand basic principles of dietary content, such as calories, fat, sodium, cholesterol and nutrients.             Nutrition Assessments:  Nutrition Assessments - 07/27/22 1258       MEDFICTS Scores   Pre Score 84            MEDIFICTS Score Key: ?70 Need to make dietary changes  40-70 Heart Healthy Diet ? 40 Therapeutic Level Cholesterol Diet   Picture Your Plate Scores: [id number] Unhealthy dietary pattern with much room for improvement. 41-50 Dietary pattern unlikely to meet recommendations for good health and room for improvement. 51-60 More healthful dietary pattern, with some room for improvement.  >60 Healthy dietary pattern, although there may be some specific behaviors that could be improved.    Nutrition Goals Re-Evaluation:   Nutrition Goals Discharge (Final Nutrition Goals Re-Evaluation):   Psychosocial: Target Goals: Acknowledge presence or absence of significant depression and/or stress, maximize coping skills, provide positive support system. Participant is able to verbalize types and ability to use techniques and skills needed for reducing stress and depression.  Initial Review & Psychosocial Screening:  Initial Psych Review & Screening - 07/27/22 1315       Initial Review   Current issues with Current Depression;Current Anxiety/Panic;Current Psychotropic Meds;Current Sleep Concerns      Family Dynamics   Good Support System? Yes    Comments Her daughter, son, and fiance are her support system.       Barriers   Psychosocial barriers to participate in program Psychosocial barriers identified (see note);The patient should benefit from training in stress management and relaxation.      Screening Interventions   Interventions Encouraged to exercise;Provide feedback about the scores to participant    Expected Outcomes Long Term Goal: Stressors or current issues are controlled or eliminated.;Short Term goal: Identification and review with participant of any Quality of Life or Depression concerns found by scoring the questionnaire.;Long Term goal: The participant improves quality of Life and PHQ9 Scores as seen by post scores and/or verbalization of changes;Short Term goal: Utilizing psychosocial counselor, staff and physician to assist with identification of specific Stressors or current issues interfering with healing process. Setting desired goal for each stressor or current issue identified.  Quality of Life Scores:  Quality of Life - 07/27/22 1317       Quality of Life   Select Quality of Life      Quality of Life Scores   Health/Function Pre 20.4 %    Socioeconomic Pre 27.43 %    Psych/Spiritual Pre 26.57 %    Family Pre 30 %    GLOBAL Pre 24.53 %            Scores of 19 and below usually indicate a poorer quality of life in these areas.  A difference of  2-3 points is a clinically meaningful difference.  A difference of 2-3 points in the total score of the Quality of Life Index has been associated with significant improvement in overall quality of life, self-image, physical symptoms, and general health in studies assessing change in quality of life.  PHQ-9: Review Flowsheet  More data exists      07/27/2022 06/22/2022 02/16/2022 01/09/2022 12/05/2021  Depression screen PHQ 2/9  Decreased Interest 1       Down, Depressed, Hopeless 2       PHQ - 2 Score 3       Altered sleeping 2      Tired, decreased energy 2      Change in appetite 0      Feeling bad or  failure about yourself  2      Trouble concentrating 2      Moving slowly or fidgety/restless 2      Suicidal thoughts 2      PHQ-9 Score 15      Difficult doing work/chores Somewhat difficult   -     Details       Information is confidential and restricted. Go to Review Flowsheets to unlock data.   Multiple values from one day are sorted in reverse-chronological order        Interpretation of Total Score  Total Score Depression Severity:  1-4 = Minimal depression, 5-9 = Mild depression, 10-14 = Moderate depression, 15-19 = Moderately severe depression, 20-27 = Severe depression   Psychosocial Evaluation and Intervention:  Psychosocial Evaluation - 07/27/22 1343       Psychosocial Evaluation & Interventions   Interventions Stress management education;Relaxation education;Encouraged to exercise with the program and follow exercise prescription    Comments Pt has no barriers to participate in CR. She does have current psychosocial issues of anxiety, depression, and sleep. She also has a history of panic attacks. She sees a psychiatrist for her mental health concerns. She currently takes Dietitian for depression/anxiety, fluoxetine for depression, and Sonata for insomnia. She reports that between the medicine and her psychiatry visits that her mental health concerns are managed well. When asked if she was stressed, she stated that she was not, but then later in orientation she mentioned that she stresses over everything. She had quit smoking back in December of 2023, but she recently started back smoking 7-8 cigs per day. She reports that the one year anniversary of her son's unexpected death is coming up and this led her to begin smoking again. She knows that she needs to quit, and plans to quit cold [place] again sometime in the future. She scored a 15 on her PHQ-9, and these answers are due to her depression, anxiety, and insomnia. She denies any suicidal idealation. She reports that she has a  good support system with her son, daughter, and fiance. Her goals while in the program are to lose weight, strengthen  her heart, and to be healthier. She is interested in improving her diet, so I will send a dietary referral. She is eager to begin the program.    Expected Outcomes Pt's depression, anxiety, and insomnia will continue to be managed medically and through pyschiatric visits. She will have no other psychosocial issues identified.    Continue Psychosocial Services  Follow up required by staff             Psychosocial Re-Evaluation:  Psychosocial Re-Evaluation     [place] Name 08/07/22 1409             Psychosocial Re-Evaluation   Current issues with Current Depression;Current Anxiety/Panic;Current Psychotropic Meds;Current Sleep Concerns       Comments Patient is new to the program. She has completed 3 sessions. She conntinues to have no psychosocial barriers identified. She seems to enjoy the sessions and demonstrates an interest in improving her health. Her depression/anxiety/ and sleep concerns continue to be managed with vraylan, fluoxetine, and sonta and she is followed by mental health routinely. We will continue to monitor her progress.       Expected Outcomes Patient will continue to have no psychosocial barriers identified and her depression, anxiety and sleep will continue to be managed with medication and counseling.       Interventions Stress management education;Encouraged to attend Cardiac Rehabilitation for the exercise;Relaxation education       Continue Psychosocial Services  No Follow up required                Psychosocial Discharge (Final Psychosocial Re-Evaluation):  Psychosocial Re-Evaluation - 08/07/22 1409       Psychosocial Re-Evaluation   Current issues with Current Depression;Current Anxiety/Panic;Current Psychotropic Meds;Current Sleep Concerns    Comments Patient is new to the program. She has completed 3 sessions. She conntinues to have no  psychosocial barriers identified. She seems to enjoy the sessions and demonstrates an interest in improving her health. Her depression/anxiety/ and sleep concerns continue to be managed with vraylan, fluoxetine, and sonta and she is followed by mental health routinely. We will continue to monitor her progress.    Expected Outcomes Patient will continue to have no psychosocial barriers identified and her depression, anxiety and sleep will continue to be managed with medication and counseling.    Interventions Stress management education;Encouraged to attend Cardiac Rehabilitation for the exercise;Relaxation education    Continue Psychosocial Services  No Follow up required             Vocational Rehabilitation: Provide vocational rehab assistance to qualifying candidates.   Vocational Rehab Evaluation & Intervention:  Vocational Rehab - 07/27/22 1334       Initial Vocational Rehab Evaluation & Intervention   Assessment shows need for Vocational Rehabilitation No      Vocational Rehab Re-[facility]   Comments retired             Education: Education Goals: Education classes will be provided on a weekly basis, covering required topics. Participant will state understanding/return demonstration of topics presented.  Learning Barriers/Preferences:  Learning Barriers/Preferences - 07/27/22 1321       Learning Barriers/Preferences   Learning Barriers None    Learning Preferences Audio;Individual Instruction;Skilled Demonstration;Verbal Instruction             Education Topics: Hypertension, Hypertension Reduction -Define heart disease and high blood pressure. Discus how high blood pressure affects the body and ways to reduce high blood pressure.   Exercise and Your Heart -Discuss why it  is important to exercise, the FITT principles of exercise, normal and abnormal responses to exercise, and how to exercise safely.   Angina -Discuss definition of angina, causes of  angina, treatment of angina, and how to decrease risk of having angina.   Cardiac Medications -Review what the following cardiac medications are used for, how they affect the body, and side effects that may occur when taking the medications.  Medications include Aspirin, Beta blockers, calcium channel blockers, ACE Inhibitors, angiotensin receptor blockers, diuretics, digoxin, and antihyperlipidemics.   Congestive Heart Failure -Discuss the definition of CHF, how to live with CHF, the signs and symptoms of CHF, and how keep track of weight and sodium intake.   Heart Disease and Intimacy -Discus the effect sexual activity has on the heart, how changes occur during intimacy as we age, and safety during sexual activity. Flowsheet Row CARDIAC REHAB PHASE II EXERCISE from 08/09/2022 in [place]  Date 08/02/22  Educator DF  Instruction Review Code 1- Verbalizes Understanding       Smoking Cessation / COPD -Discuss different methods to quit smoking, the health benefits of quitting smoking, and the definition of COPD. Flowsheet Row CARDIAC REHAB PHASE II EXERCISE from 08/09/2022 in [place]  Date 08/09/22  Educator DF  Instruction Review Code 2- Demonstrated Understanding       Nutrition I: Fats -Discuss the types of cholesterol, what cholesterol does to the heart, and how cholesterol levels can be controlled.   Nutrition II: Labels -Discuss the different components of food labels and how to read food label   Heart Parts/Heart Disease and PAD -Discuss the anatomy of the heart, the pathway of blood circulation through the heart, and these are affected by heart disease.   Stress I: Signs and Symptoms -Discuss the causes of stress, how stress may lead to anxiety and depression, and ways to limit stress.   Stress II: Relaxation -Discuss different types of relaxation techniques to limit stress.   Warning Signs of Stroke /  TIA -Discuss definition of a stroke, what the signs and symptoms are of a stroke, and how to identify when someone is having stroke.   Knowledge Questionnaire Score:  Knowledge Questionnaire Score - 07/27/22 1254       Knowledge Questionnaire Score   Pre Score 15/28             Core Components/Risk Factors/Patient Goals at Admission:  Personal Goals and Risk Factors at Admission - 07/27/22 1334       Core Components/Risk Factors/Patient Goals on Admission    Weight Management Yes    Intervention Weight Management: Develop a combined nutrition and exercise program designed to reach desired caloric intake, while maintaining appropriate intake of nutrient and fiber, sodium and fats, and appropriate energy expenditure required for the weight goal.;Weight Management: Provide education and appropriate resources to help participant work on and attain dietary goals.;Weight Management/Obesity: Establish reasonable short term and long term weight goals.;Obesity: Provide education and appropriate resources to help participant work on and attain dietary goals.    Expected Outcomes Short Term: Continue to assess and modify interventions until short term weight is achieved;Long Term: Adherence to nutrition and physical activity/exercise program aimed toward attainment of established weight goal;Weight Maintenance: Understanding of the daily nutrition guidelines, which includes 25-35% calories from fat, 7% or less cal from saturated fats, less than '200mg'$  cholesterol, less than 1.5gm of sodium, & 5 or more servings of fruits and vegetables daily;Weight Loss: Understanding of general recommendations  for a balanced deficit meal plan, which promotes 1-2 lb weight loss per week and includes a negative energy balance of [phone] kcal/d;Understanding recommendations for meals to include 15-35% energy as protein, 25-35% energy from fat, 35-60% energy from carbohydrates, less than '200mg'$  of dietary cholesterol, 20-35  gm of total fiber daily;Understanding of distribution of calorie intake throughout the day with the consumption of 4-5 meals/snacks    Tobacco Cessation Yes    Number of packs per day 1/3    Intervention Assist the participant in steps to quit. Provide individualized education and counseling about committing to Tobacco Cessation, relapse prevention, and pharmacological support that can be provided by physician.;Advice worker, assist with locating and accessing local/national Quit Smoking programs, and support quit date choice.    Expected Outcomes Short Term: Will demonstrate readiness to quit, by selecting a quit date.;Short Term: Will quit all tobacco product use, adhering to prevention of relapse plan.;Long Term: Complete abstinence from all tobacco products for at least 12 months from quit date.    Improve shortness of breath with ADL's Yes    Intervention Provide education, individualized exercise plan and daily activity instruction to help decrease symptoms of SOB with activities of daily living.    Expected Outcomes Short Term: Improve cardiorespiratory fitness to achieve a reduction of symptoms when performing ADLs;Long Term: Be able to perform more ADLs without symptoms or delay the onset of symptoms    Heart Failure Yes    Intervention Provide a combined exercise and nutrition program that is supplemented with education, support and counseling about heart failure. Directed toward relieving symptoms such as shortness of breath, decreased exercise tolerance, and extremity edema.    Expected Outcomes Improve functional capacity of life;Short term: Attendance in program 2-3 days a week with increased exercise capacity. Reported lower sodium intake. Reported increased fruit and vegetable intake. Reports medication compliance.;Short term: Daily weights obtained and reported for increase. Utilizing diuretic protocols set by physician.;Long term: Adoption of self-care skills and reduction  of barriers for early signs and symptoms recognition and intervention leading to self-care maintenance.    Hypertension Yes    Intervention Provide education on lifestyle modifcations including regular physical activity/exercise, weight management, moderate sodium restriction and increased consumption of fresh fruit, vegetables, and low fat dairy, alcohol moderation, and smoking cessation.;Monitor prescription use compliance.    Expected Outcomes Short Term: Continued assessment and intervention until BP is < 140/28m HG in hypertensive participants. < 130/835mHG in hypertensive participants with diabetes, heart failure or chronic kidney disease.;Long Term: Maintenance of blood pressure at goal levels.    Lipids Yes    Intervention Provide education and support for participant on nutrition & aerobic/resistive exercise along with prescribed medications to achieve LDL '70mg'$ , HDL >'40mg'$ .    Expected Outcomes Short Term: Participant states understanding of desired cholesterol values and is compliant with medications prescribed. Participant is following exercise prescription and nutrition guidelines.;Long Term: Cholesterol controlled with medications as prescribed, with individualized exercise RX and with personalized nutrition plan. Value goals: LDL < '70mg'$ , HDL > 40 mg.    Personal Goal Other Yes    Personal Goal Improve heart funtion and be healthier overall.    Intervention Attend CR three days per week and begin a home exercise program.    Expected Outcomes Pt will meet stated goals.             Core Components/Risk Factors/Patient Goals Review:   Goals and Risk Factor Review     Row Name 08/07/22  1416             Core Components/Risk Factors/Patient Goals Review   Personal Goals Review Lipids;Improve shortness of breath with ADL's;Tobacco Cessation;Hypertension;Heart Failure;Other;Weight Management/Obesity       Review Patient was referred to CR with stent placement. She has multiple  risk factors for CAD and is participating in the program for risk modification. She has completed 3 sessions with her current weight at 239.4 lbs up 0.4 lbs from her initial weight. Her blood pressure has been well controlled. She has quit smoking but started back in 12/23 due to the anniversary of her son's unexpected death. She smokes average 5/day. Her personal goals for the program are to lose weight; strengthen her heart and be healthier and quit smoking. We will continue to monitor her progress as she works towards meeting these goals.       Expected Outcomes Patient will complete the program meeting both personal and program goals.                Core Components/Risk Factors/Patient Goals at Discharge (Final Review):   Goals and Risk Factor Review - 08/07/22 1416       Core Components/Risk Factors/Patient Goals Review   Personal Goals Review Lipids;Improve shortness of breath with ADL's;Tobacco Cessation;Hypertension;Heart Failure;Other;Weight Management/Obesity    Review Patient was referred to CR with stent placement. She has multiple risk factors for CAD and is participating in the program for risk modification. She has completed 3 sessions with her current weight at 239.4 lbs up 0.4 lbs from her initial weight. Her blood pressure has been well controlled. She has quit smoking but started back in 12/23 due to the anniversary of her son's unexpected death. She smokes average 5/day. Her personal goals for the program are to lose weight; strengthen her heart and be healthier and quit smoking. We will continue to monitor her progress as she works towards meeting these goals.    Expected Outcomes Patient will complete the program meeting both personal and program goals.             ITP Comments:   Comments: ITP REVIEW Pt is making expected progress toward Cardiac Rehab goals after completing 6 sessions. Recommend continued exercise, life style modification, education, and increased  stamina and strength.

## 2022-08-16 NOTE — Progress Notes (Signed)
Daily Session Note  Patient Details  Name: Ashley Patrick MRN: [id number] Date of Birth: [birth year] Referring Provider:   Flowsheet Row CARDIAC REHAB PHASE II ORIENTATION from 07/27/2022 in [place]  Referring Provider Dr. Florene Glen       Encounter Date: 08/16/2022  Check In:  Session Check In - 08/16/22 1445       Check-In   Supervising physician immediately available to respond to emergencies [facility] MD immediately available    Physician(s) Dr Harl Bowie    Location [facility]    Staff Present Miller Limehouse Hassell Done, RN, BSN;Dalton Sherrie George, MS, ACSM-CEP;Melven Sartorius BSN, RN    Virtual Visit No    Medication changes reported     No    Fall or balance concerns reported    Yes    Comments She has two falls this year. She admits that her balance is not good. 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    Tobacco Cessation No Change    Warm-up and Cool-down Performed as group-led instruction    Resistance Training Performed Yes    VAD Patient? No    PAD/SET Patient? No      Pain Assessment   Currently in Pain? No/denies    Multiple Pain Sites No             Capillary Blood Glucose: No results found for this or any previous visit (from the past 24 hour(s)).    Social History   Tobacco Use  Smoking Status Former   Packs/day: 0.50   Years: 38.00   Total pack years: 19.00   Types: Cigarettes   Quit date: 06/19/2022   Years since quitting: 0.1  Smokeless Tobacco Never  Tobacco Comments   States she has not smoked this week and is attempting to quit.     Goals Met:  Independence with exercise equipment Exercise tolerated well No report of concerns or symptoms today Strength training completed today  Goals Unmet:  Not Applicable  Comments: Checkout at 1545.   Dr. Carlyle Dolly is Medical Director for [facility] Card[facility]

## 2022-08-18 ENCOUNTER — Encounter (HOSPITAL_COMMUNITY): Payer: 59

## 2022-08-21 ENCOUNTER — Encounter (HOSPITAL_COMMUNITY)
Admission: RE | Admit: 2022-08-21 | Discharge: 2022-08-21 | Disposition: A | Discharge status: Home / Self Care | Payer: 59 | Source: Ambulatory Visit | Attending: Internal Medicine | Admitting: Internal Medicine

## 2022-08-21 DIAGNOSIS — Z955 Presence of coronary angioplasty implant and graft: Secondary | ICD-10-CM | POA: Insufficient documentation

## 2022-08-21 NOTE — Progress Notes (Signed)
Daily Session Note  Patient Details  Name: Ashley Patrick MRN: [id number] Date of Birth: [birth year] Referring Provider:   Flowsheet Row CARDIAC REHAB PHASE II ORIENTATION from 07/27/2022 in [place]  Referring Provider Dr. Florene Glen       Encounter Date: 08/21/2022  Check In:  Session Check In - 08/21/22 1427       Check-In   Supervising physician immediately available to respond to emergencies [facility] MD immediately available    Physician(s) Dr Harl Bowie    Location [facility]    Staff Present Shavone Nevers Hassell Done, RN, BSN;Heather Mel Almond, BS, Exercise Physiologist    Virtual Visit No    Medication changes reported     No    Fall or balance concerns reported    Yes    Comments She has two falls this year. She admits that her balance is not good. 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    Tobacco Cessation No Change    Warm-up and Cool-down Performed as group-led instruction    Resistance Training Performed Yes    VAD Patient? No      Pain Assessment   Currently in Pain? No/denies    Multiple Pain Sites No             Capillary Blood Glucose: No results found for this or any previous visit (from the past 24 hour(s)).    Social History   Tobacco Use  Smoking Status Former   Packs/day: 0.50   Years: 38.00   Total pack years: 19.00   Types: Cigarettes   Quit date: 06/19/2022   Years since quitting: 0.1  Smokeless Tobacco Never  Tobacco Comments   States she has not smoked this week and is attempting to quit.     Goals Met:  Independence with exercise equipment Exercise tolerated well No report of concerns or symptoms today Strength training completed today  Goals Unmet:  Not Applicable  Comments: Checkout at 1545.   Dr. Carlyle Dolly is Medical Director for [facility] Cardiac Re[facility]

## 2022-08-23 ENCOUNTER — Encounter (HOSPITAL_COMMUNITY)
Admission: RE | Admit: 2022-08-23 | Discharge: 2022-08-23 | Disposition: A | Discharge status: Home / Self Care | Payer: 59 | Source: Ambulatory Visit | Attending: Internal Medicine | Admitting: Internal Medicine

## 2022-08-23 DIAGNOSIS — Z955 Presence of coronary angioplasty implant and graft: Secondary | ICD-10-CM | POA: Diagnosis not present

## 2022-08-23 NOTE — Progress Notes (Signed)
Daily Session Note  Patient Details  Name: Ashley Patrick MRN: [id number] Date of Birth: 09/24/1955 Referring Provider:   Flowsheet Row CARDIAC REHAB PHASE II ORIENTATION from 07/27/2022 in [place]  Referring Provider Dr. Florene Glen       Encounter Date: 08/23/2022  Check In:  Session Check In - 08/23/22 1445       Check-In   Supervising physician immediately available to respond to emergencies [facility] MD immediately available    Physician(s) Dr Harl Bowie    Location [facility]    Staff Present Leana Roe, BS, Exercise Physiologist;Hillary Troutman BSN, RN    Virtual Visit No    Medication changes reported     No    Fall or balance concerns reported    Yes    Comments She has two falls this year. She admits that her balance is not good. 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    Tobacco Cessation No Change    Current number of cigarettes/nicotine per day     7    Warm-up and Cool-down Performed as group-led instruction    Resistance Training Performed Yes    VAD Patient? No    PAD/SET Patient? No      Pain Assessment   Currently in Pain? No/denies    Multiple Pain Sites No             Capillary Blood Glucose: No results found for this or any previous visit (from the past 24 hour(s)).    Social History   Tobacco Use  Smoking Status Former   Packs/day: 0.50   Years: 38.00   Total pack years: 19.00   Types: Cigarettes   Quit date: 06/19/2022   Years since quitting: 0.1  Smokeless Tobacco Never  Tobacco Comments   States she has not smoked this week and is attempting to quit.     Goals Met:  Independence with exercise equipment Exercise tolerated well No report of concerns or symptoms today Strength training completed today  Goals Unmet:  Not Applicable  Comments: check out 1545   Dr. Carlyle Dolly is Medical Director for [place]

## 2022-08-24 ENCOUNTER — Ambulatory Visit: Payer: 59 | Admitting: Nutrition

## 2022-08-25 ENCOUNTER — Encounter (HOSPITAL_COMMUNITY): Payer: 59

## 2022-08-28 ENCOUNTER — Encounter (HOSPITAL_COMMUNITY): Payer: 59

## 2022-08-30 ENCOUNTER — Encounter (HOSPITAL_COMMUNITY): Payer: 59

## 2022-09-01 ENCOUNTER — Encounter (HOSPITAL_COMMUNITY): Payer: 59

## 2022-09-04 ENCOUNTER — Encounter (HOSPITAL_COMMUNITY): Payer: 59
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
## 2022-09-06 ENCOUNTER — Encounter (HOSPITAL_COMMUNITY): Payer: 59

## 2022-09-07 ENCOUNTER — Ambulatory Visit (INDEPENDENT_AMBULATORY_CARE_PROVIDER_SITE_OTHER): Payer: 59 | Admitting: Psychiatry

## 2022-09-07 ENCOUNTER — Encounter: Payer: Self-pay | Admitting: Psychiatry

## 2022-09-07 VITALS — BP 104/72 | HR 85 | Temp 97.9°F | Ht 66.0 in | Wt 238.6 lb

## 2022-09-07 DIAGNOSIS — G2401 Drug induced subacute dyskinesia: Secondary | ICD-10-CM

## 2022-09-07 DIAGNOSIS — T50905A Adverse effect of unspecified drugs, medicaments and biological substances, initial encounter: Secondary | ICD-10-CM

## 2022-09-07 DIAGNOSIS — R635 Abnormal weight gain: Secondary | ICD-10-CM

## 2022-09-07 DIAGNOSIS — G4700 Insomnia, unspecified: Secondary | ICD-10-CM

## 2022-09-07 DIAGNOSIS — F330 Major depressive disorder, recurrent, mild: Secondary | ICD-10-CM | POA: Diagnosis not present

## 2022-09-07 MED ORDER — DEUTETRABENAZINE 6 MG PO TABS: 6.0000 mg | ORAL_TABLET | Freq: Every day | ORAL | 1 refills | Status: DC

## 2022-09-07 NOTE — Patient Instructions (Signed)
Continue fluoxetine 40 mg daily  Continue Vraylar 1.5 mg daily Continue metformin 500 mg in PM Discontinue tetrabenazine  Start deutertrabenazine 6 mg daily  Continue Sonata 5 mg at night as needed for insomnia  Next appointment: 5/13 at 9:30

## 2022-09-08 ENCOUNTER — Encounter (HOSPITAL_COMMUNITY): Payer: 59

## 2022-09-11 ENCOUNTER — Encounter (HOSPITAL_COMMUNITY): Payer: 59

## 2022-09-13 ENCOUNTER — Encounter (HOSPITAL_COMMUNITY): Payer: 59

## 2022-09-13 NOTE — Progress Notes (Signed)
Cardiac Individual Treatment Plan  Patient Details  Name: Ashley Patrick MRN: [id number] Date of Birth: 05-22-1956 Referring Provider:   Flowsheet Row CARDIAC REHAB PHASE II ORIENTATION from 07/27/2022 in [facility]  Referring Provider Dr. Florene Glen       Initial Encounter Date:  Flowsheet Row CARDIAC REHAB PHASE II ORIENTATION from 07/27/2022 in [facility]  Date 07/27/22       Visit Diagnosis: S/P coronary artery stent placement  Patient's Home Medications on Admission:  Current Outpatient Medications:    acetaminophen (TYLENOL) 650 MG CR tablet, Take 650 mg by mouth every 8 (eight) hours as needed for pain., Disp: , Rfl:    albuterol (VENTOLIN HFA) 108 (90 Base) MCG/ACT inhaler, Inhale 2 puffs into the lungs every 4 (four) hours as needed for wheezing or shortness of breath., Disp: , Rfl:    amLODipine (NORVASC) 5 MG tablet, Take 5 mg by mouth every morning., Disp: , Rfl:    aspirin EC 81 MG tablet, Take 81 mg by mouth daily. Swallow whole., Disp: , Rfl:    cariprazine (VRAYLAR) 1.5 MG capsule, Take 1 capsule (1.5 mg total) by mouth daily., Disp: 90 capsule, Rfl: 0   celecoxib (CELEBREX) 100 MG capsule, Take 100 mg by mouth 2 (two) times daily as needed for mild pain., Disp: , Rfl:    clopidogrel (PLAVIX) 75 MG tablet, Take 1 tablet (75 mg total) by mouth daily with breakfast., Disp: 90 tablet, Rfl: 1   Deutetrabenazine 6 MG TABS, Take 6 mg by mouth daily., Disp: 30 tablet, Rfl: 1   furosemide (LASIX) 80 MG tablet, Take 1 tablet (80 mg total) by mouth in the morning. & 80 mg at 5:00 pm, Disp: 60 tablet, Rfl: 3   metFORMIN (GLUCOPHAGE) 500 MG tablet, Take 500 mg by mouth every morning., Disp: , Rfl:    metoprolol succinate (TOPROL-XL) 50 MG 24 hr tablet, Take 1 tablet (50 mg total) by mouth daily., Disp: 30 tablet, Rfl: 6   Multiple Vitamins-Minerals (MULTIVITAMIN WITH MINERALS) tablet, Take 1 tablet by mouth daily., Disp: , Rfl:     olmesartan (BENICAR) 5 MG tablet, Take 1 tablet (5 mg total) by mouth daily., Disp: 30 tablet, Rfl: 0   pantoprazole (PROTONIX) 40 MG tablet, Take 1 tablet (40 mg total) by mouth daily., Disp: 90 tablet, Rfl: 1   potassium chloride SA (KLOR-CON M) 20 MEQ tablet, Take 1 tablet (20 mEq total) by mouth daily., Disp: 30 tablet, Rfl: 3   rosuvastatin (CRESTOR) 40 MG tablet, Take 1 tablet (40 mg total) by mouth daily., Disp: 90 tablet, Rfl: 1   SPIRIVA RESPIMAT 1.25 MCG/ACT AERS, Inhale 2 puffs into the lungs daily., Disp: , Rfl:    traZODone (DESYREL) 150 MG tablet, Take 300 mg by mouth at bedtime., Disp: , Rfl:    vitamin B-12 (CYANOCOBALAMIN) 1000 MCG tablet, Take 1,000 mcg by mouth daily., Disp: , Rfl:    zaleplon (SONATA) 5 MG capsule, Take 1 capsule (5 mg total) by mouth at bedtime as needed for sleep., Disp: 30 capsule, Rfl: 2  Past Medical History: Past Medical History:  Diagnosis Date   Anxiety    Aortic atherosclerosis ([place])    Arthritis    Asthma    Back pain    Bipolar disorder ([place])    Cancer ([place]) 2014   Colon Cancer   Constipation    Depression    Dyspnea    Hypercholesteremia    Hypertension    Insomnia  Lateral epicondylitis  of elbow    right   Migraine headache    Nicotine addiction    Obesity    History of   OD (overdose of drug)    hospitalized for 11 days    Panic attacks    Psychosis ([place])    Schizophrenia ([place])     Tobacco Use: Social History   Tobacco Use  Smoking Status Former   Packs/day: 0.50   Years: 38.00   Additional pack years: 0.00   Total pack years: 19.00   Types: Cigarettes   Quit date: 06/19/2022   Years since quitting: 0.2  Smokeless Tobacco Never  Tobacco Comments   States she has not smoked this week and is attempting to quit.     Labs: Review Flowsheet  More data exists      Latest Ref Rng & Units 04/24/2022 06/28/2022 06/29/2022 06/30/2022 07/25/2022  Labs for ITP Cardiac and Pulmonary Rehab  Cholestrol 100 - 199 mg/dL 258   156  - - 138   LDL (calc) 0 - 99 mg/dL 172  80  - - 65   HDL-C >39 mg/dL 50  53  - - 52   Trlycerides 0 - 149 mg/dL 178  116  - - 120   Hemoglobin A1c 4.8 - 5.6 % - - - 5.8  -  PH, Arterial 7.35 - 7.45 - - 7.351  - -  PCO2 arterial 32 - 48 mmHg - - 48.3  - -  Bicarbonate 20.0 - 28.0 mmol/L 20.0 - 28.0 mmol/L - - 28.8  29.6  26.8  - -  TCO2 22 - 32 mmol/L 22 - 32 mmol/L - - 30  31  28   - -  O2 Saturation % % - - 65  65  89  - -    Capillary Blood Glucose: Lab Results  Component Value Date   GLUCAP 109 (H) 08/07/2022   GLUCAP 145 (H) 08/02/2022   GLUCAP 114 (H) 07/27/2022   GLUCAP 90 06/30/2022   GLUCAP 92 06/30/2022    POCT Glucose     Row Name 07/27/22 1304             POCT Blood Glucose   Pre-Exercise 114 mg/dL                Exercise Target Goals: Exercise Program Goal: Individual exercise prescription set using results from initial 6 min walk test and THRR while considering  patient's activity barriers and safety.   Exercise Prescription Goal: Starting with aerobic activity 30 plus minutes a day, 3 days per week for initial exercise prescription. Provide home exercise prescription and guidelines that participant acknowledges understanding prior to discharge.  Activity Barriers & Risk Stratification:  Activity Barriers & Cardiac Risk Stratification - 07/27/22 1312       Activity Barriers & Cardiac Risk Stratification   Activity Barriers Arthritis;Joint Problems;Deconditioning;Shortness of Breath;Balance Concerns;History of Falls;Assistive Device    Cardiac Risk Stratification High             6 Minute Walk:  6 Minute Walk     Row Name 07/27/22 1314         6 Minute Walk   Phase Initial     Distance 1200 feet     Walk Time 6 minutes     # of Rest Breaks 0     MPH 2.27     METS 2.54     RPE 14     VO2 Peak 8.89  Symptoms Yes (comment)     Comments Slight SOB while walking     Resting HR 88 bpm     Resting BP 120/70     Resting  Oxygen Saturation  99 %     Exercise Oxygen Saturation  during 6 min walk 98 %     Max Ex. HR 106 bpm     Max Ex. BP 140/70     2 Minute Post BP 132/70              Oxygen Initial Assessment:   Oxygen Re-Evaluation:   Oxygen Discharge (Final Oxygen Re-Evaluation):   Initial Exercise Prescription:  Initial Exercise Prescription - 07/27/22 1300       Date of Initial Exercise RX and Referring Provider   Date 07/27/22    Referring Provider Dr. Florene Glen    Expected Discharge Date 10/20/22      Treadmill   MPH 1.8    Grade 0    Minutes 17      NuStep   Level 1    SPM 60    Minutes 22      Prescription Details   Frequency (times per week) 3    Duration Progress to 30 minutes of continuous aerobic without signs/symptoms of physical distress      Intensity   THRR 40-80% of Max Heartrate 62-125    Ratings of Perceived Exertion 11-13    Perceived Dyspnea 0-4      Resistance Training   Training Prescription Yes    Weight 3    Reps 10-15             Perform Capillary Blood Glucose checks as needed.  Exercise Prescription Changes:   Exercise Prescription Changes     Row Name 08/14/22 1500 08/23/22 1500           Response to Exercise   Blood Pressure (Admit) 134/76 108/62      Blood Pressure (Exercise) 152/68 146/62      Blood Pressure (Exit) 128/60 102/60      Heart Rate (Admit) 94 bpm 92 bpm      Heart Rate (Exercise) 125 bpm 116 bpm      Heart Rate (Exit) 103 bpm 101 bpm      Rating of Perceived Exertion (Exercise) 13 12      Comments -- .      Duration Continue with 30 min of aerobic exercise without signs/symptoms of physical distress. Continue with 30 min of aerobic exercise without signs/symptoms of physical distress.      Intensity THRR unchanged THRR unchanged        Progression   Progression Continue to progress workloads to maintain intensity without signs/symptoms of physical distress. Continue to progress workloads to maintain intensity  without signs/symptoms of physical distress.        Resistance Training   Training Prescription Yes Yes      Weight 3 3      Reps 10-15 10-15      Time 10 Minutes 10 Minutes        Treadmill   MPH 2.3 2.5      Grade 0 0      Minutes 17 17      METs 2.76 2.91        NuStep   Level 1 2      SPM 109 101      Minutes 22 22      METs 2.84 2.99  Exercise Comments:   Exercise Goals and Review:   Exercise Goals     Row Name 07/27/22 1316 08/14/22 1500 09/11/22 1520         Exercise Goals   Increase Physical Activity Yes Yes Yes     Intervention Provide advice, education, support and counseling about physical activity/exercise needs.;Develop an individualized exercise prescription for aerobic and resistive training based on initial evaluation findings, risk stratification, comorbidities and participant's personal goals. Provide advice, education, support and counseling about physical activity/exercise needs.;Develop an individualized exercise prescription for aerobic and resistive training based on initial evaluation findings, risk stratification, comorbidities and participant's personal goals. Provide advice, education, support and counseling about physical activity/exercise needs.;Develop an individualized exercise prescription for aerobic and resistive training based on initial evaluation findings, risk stratification, comorbidities and participant's personal goals.     Expected Outcomes Short Term: Attend rehab on a regular basis to increase amount of physical activity.;Long Term: Add in home exercise to make exercise part of routine and to increase amount of physical activity.;Long Term: Exercising regularly at least 3-5 days a week. Short Term: Attend rehab on a regular basis to increase amount of physical activity.;Long Term: Add in home exercise to make exercise part of routine and to increase amount of physical activity.;Long Term: Exercising regularly at least  3-5 days a week. Short Term: Attend rehab on a regular basis to increase amount of physical activity.;Long Term: Add in home exercise to make exercise part of routine and to increase amount of physical activity.;Long Term: Exercising regularly at least 3-5 days a week.     Increase Strength and Stamina Yes Yes Yes     Intervention Provide advice, education, support and counseling about physical activity/exercise needs.;Develop an individualized exercise prescription for aerobic and resistive training based on initial evaluation findings, risk stratification, comorbidities and participant's personal goals. Provide advice, education, support and counseling about physical activity/exercise needs.;Develop an individualized exercise prescription for aerobic and resistive training based on initial evaluation findings, risk stratification, comorbidities and participant's personal goals. Provide advice, education, support and counseling about physical activity/exercise needs.;Develop an individualized exercise prescription for aerobic and resistive training based on initial evaluation findings, risk stratification, comorbidities and participant's personal goals.     Expected Outcomes Short Term: Increase workloads from initial exercise prescription for resistance, speed, and METs.;Short Term: Perform resistance training exercises routinely during rehab and add in resistance training at home;Long Term: Improve cardiorespiratory fitness, muscular endurance and strength as measured by increased METs and functional capacity (6MWT) Short Term: Increase workloads from initial exercise prescription for resistance, speed, and METs.;Short Term: Perform resistance training exercises routinely during rehab and add in resistance training at home;Long Term: Improve cardiorespiratory fitness, muscular endurance and strength as measured by increased METs and functional capacity (6MWT) Short Term: Increase workloads from initial  exercise prescription for resistance, speed, and METs.;Short Term: Perform resistance training exercises routinely during rehab and add in resistance training at home;Long Term: Improve cardiorespiratory fitness, muscular endurance and strength as measured by increased METs and functional capacity (6MWT)     Able to understand and use rate of perceived exertion (RPE) scale Yes Yes Yes     Intervention Provide education and explanation on how to use RPE scale Provide education and explanation on how to use RPE scale Provide education and explanation on how to use RPE scale     Expected Outcomes Long Term:  Able to use RPE to guide intensity level when exercising independently;Short Term: Able to use RPE daily  in rehab to express subjective intensity level Long Term:  Able to use RPE to guide intensity level when exercising independently;Short Term: Able to use RPE daily in rehab to express subjective intensity level Long Term:  Able to use RPE to guide intensity level when exercising independently;Short Term: Able to use RPE daily in rehab to express subjective intensity level     Knowledge and understanding of Target Heart Rate Range (THRR) Yes Yes Yes     Intervention Provide education and explanation of THRR including how the numbers were predicted and where they are located for reference Provide education and explanation of THRR including how the numbers were predicted and where they are located for reference Provide education and explanation of THRR including how the numbers were predicted and where they are located for reference     Expected Outcomes Short Term: Able to state/look up THRR;Short Term: Able to use daily as guideline for intensity in rehab;Long Term: Able to use THRR to govern intensity when exercising independently Short Term: Able to state/look up THRR;Short Term: Able to use daily as guideline for intensity in rehab;Long Term: Able to use THRR to govern intensity when exercising  independently Short Term: Able to state/look up THRR;Short Term: Able to use daily as guideline for intensity in rehab;Long Term: Able to use THRR to govern intensity when exercising independently     Able to check pulse independently Yes Yes Yes     Intervention Provide education and demonstration on how to check pulse in carotid and radial arteries.;Review the importance of being able to check your own pulse for safety during independent exercise Provide education and demonstration on how to check pulse in carotid and radial arteries.;Review the importance of being able to check your own pulse for safety during independent exercise Provide education and demonstration on how to check pulse in carotid and radial arteries.;Review the importance of being able to check your own pulse for safety during independent exercise     Expected Outcomes Short Term: Able to explain why pulse checking is important during independent exercise;Long Term: Able to check pulse independently and accurately Short Term: Able to explain why pulse checking is important during independent exercise;Long Term: Able to check pulse independently and accurately Short Term: Able to explain why pulse checking is important during independent exercise;Long Term: Able to check pulse independently and accurately     Understanding of Exercise Prescription Yes Yes Yes     Intervention Provide education, explanation, and written materials on patient's individual exercise prescription Provide education, explanation, and written materials on patient's individual exercise prescription Provide education, explanation, and written materials on patient's individual exercise prescription     Expected Outcomes Short Term: Able to explain program exercise prescription;Long Term: Able to explain home exercise prescription to exercise independently Short Term: Able to explain program exercise prescription;Long Term: Able to explain home exercise prescription to  exercise independently Short Term: Able to explain program exercise prescription;Long Term: Able to explain home exercise prescription to exercise independently              Exercise Goals Re-Evaluation :  Exercise Goals Re-Evaluation     Row Name 08/14/22 1500 09/11/22 1520           Exercise Goal Re-Evaluation   Exercise Goals Review Increase Physical Activity;Increase Strength and Stamina;Able to understand and use rate of perceived exertion (RPE) scale;Knowledge and understanding of Target Heart Rate Range (THRR);Able to check pulse independently;Understanding of Exercise Prescription Increase Physical Activity;Able to  understand and use rate of perceived exertion (RPE) scale;Increase Strength and Stamina;Knowledge and understanding of Target Heart Rate Range (THRR);Able to check pulse independently;Understanding of Exercise Prescription      Comments Pt has completed 5 sessions of cardiac rehab. She enjoys coming to class and is eager to improve her health. She has recently pushed herself too hard in class and was asked to slow down on the treadmill and stepper due to her HR going over her THR. She is currently exercising at 2.84 METs on the stepper. Will continue to monitor and progress as able. Pt has completed 8 sessions of caridac rehab. She has not been recently first due to respiratory illness but has not returned or called recently. She was exercising at 2.99 METs on the stepper. Will update when able.      Expected Outcomes Through exercise at rehab and home, the patient will meet thier stated goals Through exercise at rehab and home, the patient will meet thier stated goals                Discharge Exercise Prescription (Final Exercise Prescription Changes):  Exercise Prescription Changes - 08/23/22 1500       Response to Exercise   Blood Pressure (Admit) 108/62    Blood Pressure (Exercise) 146/62    Blood Pressure (Exit) 102/60    Heart Rate (Admit) 92 bpm    Heart  Rate (Exercise) 116 bpm    Heart Rate (Exit) 101 bpm    Rating of Perceived Exertion (Exercise) 12    Comments .    Duration Continue with 30 min of aerobic exercise without signs/symptoms of physical distress.    Intensity THRR unchanged      Progression   Progression Continue to progress workloads to maintain intensity without signs/symptoms of physical distress.      Resistance Training   Training Prescription Yes    Weight 3    Reps 10-15    Time 10 Minutes      Treadmill   MPH 2.5    Grade 0    Minutes 17    METs 2.91      NuStep   Level 2    SPM 101    Minutes 22    METs 2.99             Nutrition:  Target Goals: Understanding of nutrition guidelines, daily intake of sodium 1500mg , cholesterol 200mg , calories 30% from fat and 7% or less from saturated fats, daily to have 5 or more servings of fruits and vegetables.  Biometrics:  Pre Biometrics - 07/27/22 1316       Pre Biometrics   Height 5\' 6"  (1.676 m)    Weight 108.5 kg    Waist Circumference 45 inches    Hip Circumference 47.8 inches    Waist to Hip Ratio 0.94 %    BMI (Calculated) 38.63    Triceps Skinfold 45 mm    % Body Fat 50.2 %    Grip Strength 34.4 kg    Flexibility 0 in    Single Leg Stand 2.24 seconds              Nutrition Therapy Plan and Nutrition Goals:  Nutrition Therapy & Goals - 08/07/22 1408       Personal Nutrition Goals   Comments Patient's diet assessment score was 84. We have educational sessions on heart healthy nutrition with handouts and offer assistance with RD referral if patient is interested.  Intervention Plan   Intervention Nutrition handout(s) given to patient.    Expected Outcomes Short Term Goal: Understand basic principles of dietary content, such as calories, fat, sodium, cholesterol and nutrients.             Nutrition Assessments:  Nutrition Assessments - 07/27/22 1258       MEDFICTS Scores   Pre Score 84            MEDIFICTS  Score Key: ?70 Need to make dietary changes  40-70 Heart Healthy Diet ? 40 Therapeutic Level Cholesterol Diet   Picture Your Plate Scores: [id number] Unhealthy dietary pattern with much room for improvement. 41-50 Dietary pattern unlikely to meet recommendations for good health and room for improvement. 51-60 More healthful dietary pattern, with some room for improvement.  >60 Healthy dietary pattern, although there may be some specific behaviors that could be improved.    Nutrition Goals Re-Evaluation:   Nutrition Goals Discharge (Final Nutrition Goals Re-Evaluation):   Psychosocial: Target Goals: Acknowledge presence or absence of significant depression and/or stress, maximize coping skills, provide positive support system. Participant is able to verbalize types and ability to use techniques and skills needed for reducing stress and depression.  Initial Review & Psychosocial Screening:  Initial Psych Review & Screening - 07/27/22 1315       Initial Review   Current issues with Current Depression;Current Anxiety/Panic;Current Psychotropic Meds;Current Sleep Concerns      Family Dynamics   Good Support System? Yes    Comments Her daughter, son, and fiance are her support system.      Barriers   Psychosocial barriers to participate in program Psychosocial barriers identified (see note);The patient should benefit from training in stress management and relaxation.      Screening Interventions   Interventions Encouraged to exercise;Provide feedback about the scores to participant    Expected Outcomes Long Term Goal: Stressors or current issues are controlled or eliminated.;Short Term goal: Identification and review with participant of any Quality of Life or Depression concerns found by scoring the questionnaire.;Long Term goal: The participant improves quality of Life and PHQ9 Scores as seen by post scores and/or verbalization of changes;Short Term goal: Utilizing psychosocial counselor,  staff and physician to assist with identification of specific Stressors or current issues interfering with healing process. Setting desired goal for each stressor or current issue identified.             Quality of Life Scores:  Quality of Life - 07/27/22 1317       Quality of Life   Select Quality of Life      Quality of Life Scores   Health/Function Pre 20.4 %    Socioeconomic Pre 27.43 %    Psych/Spiritual Pre 26.57 %    Family Pre 30 %    GLOBAL Pre 24.53 %            Scores of 19 and below usually indicate a poorer quality of life in these areas.  A difference of  2-3 points is a clinically meaningful difference.  A difference of 2-3 points in the total score of the Quality of Life Index has been associated with significant improvement in overall quality of life, self-image, physical symptoms, and general health in studies assessing change in quality of life.  PHQ-9: Review Flowsheet  More data exists      09/07/2022 07/27/2022 06/22/2022 02/16/2022 01/09/2022  Depression screen PHQ 2/9  Decreased Interest  1     Down, Depressed, Hopeless  2     PHQ - 2 Score  3     Altered sleeping  2     Tired, decreased energy  2     Change in appetite  0     Feeling bad or failure about yourself   2     Trouble concentrating  2     Moving slowly or fidgety/restless  2     Suicidal thoughts  2     PHQ-9 Score  15     Difficult doing work/chores  Somewhat difficult   -    Details       Information is confidential and restricted. Go to Review Flowsheets to unlock data.        Interpretation of Total Score  Total Score Depression Severity:  1-4 = Minimal depression, 5-9 = Mild depression, 10-14 = Moderate depression, 15-19 = Moderately severe depression, 20-27 = Severe depression   Psychosocial Evaluation and Intervention:  Psychosocial Evaluation - 07/27/22 1343       Psychosocial Evaluation & Interventions   Interventions Stress management education;Relaxation  education;Encouraged to exercise with the program and follow exercise prescription    Comments Pt has no barriers to participate in CR. She does have current psychosocial issues of anxiety, depression, and sleep. She also has a history of panic attacks. She sees a psychiatrist for her mental health concerns. She currently takes Dietitian for depression/anxiety, fluoxetine for depression, and Sonata for insomnia. She reports that between the medicine and her psychiatry visits that her mental health concerns are managed well. When asked if she was stressed, she stated that she was not, but then later in orientation she mentioned that she stresses over everything. She had quit smoking back in December of 2023, but she recently started back smoking 7-8 cigs per day. She reports that the one year anniversary of her son's unexpected death is coming up and this led her to begin smoking again. She knows that she needs to quit, and plans to quit cold [place] again sometime in the future. She scored a 15 on her PHQ-9, and these answers are due to her depression, anxiety, and insomnia. She denies any suicidal idealation. She reports that she has a good support system with her son, daughter, and fiance. Her goals while in the program are to lose weight, strengthen her heart, and to be healthier. She is interested in improving her diet, so I will send a dietary referral. She is eager to begin the program.    Expected Outcomes Pt's depression, anxiety, and insomnia will continue to be managed medically and through pyschiatric visits. She will have no other psychosocial issues identified.    Continue Psychosocial Services  Follow up required by staff             Psychosocial Re-Evaluation:  Psychosocial Re-Evaluation     [place] Name 08/07/22 1409 09/04/22 1419           Psychosocial Re-Evaluation   Current issues with Current Depression;Current Anxiety/Panic;Current Psychotropic Meds;Current Sleep Concerns Current  Depression;Current Anxiety/Panic;Current Psychotropic Meds;Current Sleep Concerns      Comments Patient is new to the program. She has completed 3 sessions. She conntinues to have no psychosocial barriers identified. She seems to enjoy the sessions and demonstrates an interest in improving her health. Her depression/anxiety/ and sleep concerns continue to be managed with vraylan, fluoxetine, and sonta and she is followed by mental health routinely. We will continue to monitor her progress. Patient has completed 8 sessions. She  conntinues to have no psychosocial barriers identified. She seems to enjoy the sessions and demonstrates an interest in improving her health. Her depression/anxiety/ and sleep concerns continue to be managed with vraylan, fluoxetine, and sonta and she is followed by mental health routinely. Her attendance has not been consistent due to acute illness. We will continue to monitor her progress.      Expected Outcomes Patient will continue to have no psychosocial barriers identified and her depression, anxiety and sleep will continue to be managed with medication and counseling. Patient will continue to have no psychosocial barriers identified and her depression, anxiety and sleep will continue to be managed with medication and counseling.      Interventions Stress management education;Encouraged to attend Cardiac Rehabilitation for the exercise;Relaxation education Stress management education;Encouraged to attend Cardiac Rehabilitation for the exercise;Relaxation education      Continue Psychosocial Services  No Follow up required No Follow up required               Psychosocial Discharge (Final Psychosocial Re-Evaluation):  Psychosocial Re-Evaluation - 09/04/22 1419       Psychosocial Re-Evaluation   Current issues with Current Depression;Current Anxiety/Panic;Current Psychotropic Meds;Current Sleep Concerns    Comments Patient has completed 8 sessions. She conntinues to have  no psychosocial barriers identified. She seems to enjoy the sessions and demonstrates an interest in improving her health. Her depression/anxiety/ and sleep concerns continue to be managed with vraylan, fluoxetine, and sonta and she is followed by mental health routinely. Her attendance has not been consistent due to acute illness. We will continue to monitor her progress.    Expected Outcomes Patient will continue to have no psychosocial barriers identified and her depression, anxiety and sleep will continue to be managed with medication and counseling.    Interventions Stress management education;Encouraged to attend Cardiac Rehabilitation for the exercise;Relaxation education    Continue Psychosocial Services  No Follow up required             Vocational Rehabilitation: Provide vocational rehab assistance to qualifying candidates.   Vocational Rehab Evaluation & Intervention:  Vocational Rehab - 07/27/22 1334       Initial Vocational Rehab Evaluation & Intervention   Assessment shows need for Vocational Rehabilitation No      Vocational Rehab Re-Eva[facility]   Comments retired             Education: Education Goals: Education classes will be provided on a weekly basis, covering required topics. Participant will state understanding/return demonstration of topics presented.  Learning Barriers/Preferences:  Learning Barriers/Preferences - 07/27/22 1321       Learning Barriers/Preferences   Learning Barriers None    Learning Preferences Audio;Individual Instruction;Skilled Demonstration;Verbal Instruction             Education Topics: Hypertension, Hypertension Reduction -Define heart disease and high blood pressure. Discus how high blood pressure affects the body and ways to reduce high blood pressure.   Exercise and Your Heart -Discuss why it is important to exercise, the FITT principles of exercise, normal and abnormal responses to exercise, and how to exercise  safely.   Angina -Discuss definition of angina, causes of angina, treatment of angina, and how to decrease risk of having angina.   Cardiac Medications -Review what the following cardiac medications are used for, how they affect the body, and side effects that may occur when taking the medications.  Medications include Aspirin, Beta blockers, calcium channel blockers, ACE Inhibitors, angiotensin receptor blockers, diuretics, digoxin, and antihyperlipidemics.  Congestive Heart Failure -Discuss the definition of CHF, how to live with CHF, the signs and symptoms of CHF, and how keep track of weight and sodium intake.   Heart Disease and Intimacy -Discus the effect sexual activity has on the heart, how changes occur during intimacy as we age, and safety during sexual activity. Flowsheet Row CARDIAC REHAB PHASE II EXERCISE from 08/23/2022 in [place]  Date 08/02/22  Educator DF  Instruction Review Code 1- Verbalizes Understanding       Smoking Cessation / COPD -Discuss different methods to quit smoking, the health benefits of quitting smoking, and the definition of COPD. Flowsheet Row CARDIAC REHAB PHASE II EXERCISE from 08/23/2022 in [place]  Date 08/09/22  Educator DF  Instruction Review Code 2- Demonstrated Understanding       Nutrition I: Fats -Discuss the types of cholesterol, what cholesterol does to the heart, and how cholesterol levels can be controlled. Flowsheet Row CARDIAC REHAB PHASE II EXERCISE from 08/23/2022 in [place]  Date 08/16/22  Educator DF  Instruction Review Code 1- Verbalizes Understanding       Nutrition II: Labels -Discuss the different components of food labels and how to read food label [place] from 08/23/2022 in [place]  Date 08/23/22  Educator HB  Instruction Review Code 2- Demonstrated Understanding        Heart Parts/Heart Disease and PAD -Discuss the anatomy of the heart, the pathway of blood circulation through the heart, and these are affected by heart disease.   Stress I: Signs and Symptoms -Discuss the causes of stress, how stress may lead to anxiety and depression, and ways to limit stress.   Stress II: Relaxation -Discuss different types of relaxation techniques to limit stress.   Warning Signs of Stroke / TIA -Discuss definition of a stroke, what the signs and symptoms are of a stroke, and how to identify when someone is having stroke.   Knowledge Questionnaire Score:  Knowledge Questionnaire Score - 07/27/22 1254       Knowledge Questionnaire Score   Pre Score 15/28             Core Components/Risk Factors/Patient Goals at Admission:  Personal Goals and Risk Factors at Admission - 07/27/22 1334       Core Components/Risk Factors/Patient Goals on Admission    Weight Management Yes    Intervention Weight Management: Develop a combined nutrition and exercise program designed to reach desired caloric intake, while maintaining appropriate intake of nutrient and fiber, sodium and fats, and appropriate energy expenditure required for the weight goal.;Weight Management: Provide education and appropriate resources to help participant work on and attain dietary goals.;Weight Management/Obesity: Establish reasonable short term and long term weight goals.;Obesity: Provide education and appropriate resources to help participant work on and attain dietary goals.    Expected Outcomes Short Term: Continue to assess and modify interventions until short term weight is achieved;Long Term: Adherence to nutrition and physical activity/exercise program aimed toward attainment of established weight goal;Weight Maintenance: Understanding of the daily nutrition guidelines, which includes 25-35% calories from fat, 7% or less cal from saturated fats, less than 200mg  cholesterol, less than  1.5gm of sodium, & 5 or more servings of fruits and vegetables daily;Weight Loss: Understanding of general recommendations for a balanced deficit meal plan, which promotes 1-2 lb weight loss per week and includes a negative energy balance of [phone] kcal/d;Understanding recommendations for meals to  include 15-35% energy as protein, 25-35% energy from fat, 35-60% energy from carbohydrates, less than 200mg  of dietary cholesterol, 20-35 gm of total fiber daily;Understanding of distribution of calorie intake throughout the day with the consumption of 4-5 meals/snacks    Tobacco Cessation Yes    Number of packs per day 1/3    Intervention Assist the participant in steps to quit. Provide individualized education and counseling about committing to Tobacco Cessation, relapse prevention, and pharmacological support that can be provided by physician.;Advice worker, assist with locating and accessing local/national Quit Smoking programs, and support quit date choice.    Expected Outcomes Short Term: Will demonstrate readiness to quit, by selecting a quit date.;Short Term: Will quit all tobacco product use, adhering to prevention of relapse plan.;Long Term: Complete abstinence from all tobacco products for at least 12 months from quit date.    Improve shortness of breath with ADL's Yes    Intervention Provide education, individualized exercise plan and daily activity instruction to help decrease symptoms of SOB with activities of daily living.    Expected Outcomes Short Term: Improve cardiorespiratory fitness to achieve a reduction of symptoms when performing ADLs;Long Term: Be able to perform more ADLs without symptoms or delay the onset of symptoms    Heart Failure Yes    Intervention Provide a combined exercise and nutrition program that is supplemented with education, support and counseling about heart failure. Directed toward relieving symptoms such as shortness of breath, decreased exercise  tolerance, and extremity edema.    Expected Outcomes Improve functional capacity of life;Short term: Attendance in program 2-3 days a week with increased exercise capacity. Reported lower sodium intake. Reported increased fruit and vegetable intake. Reports medication compliance.;Short term: Daily weights obtained and reported for increase. Utilizing diuretic protocols set by physician.;Long term: Adoption of self-care skills and reduction of barriers for early signs and symptoms recognition and intervention leading to self-care maintenance.    Hypertension Yes    Intervention Provide education on lifestyle modifcations including regular physical activity/exercise, weight management, moderate sodium restriction and increased consumption of fresh fruit, vegetables, and low fat dairy, alcohol moderation, and smoking cessation.;Monitor prescription use compliance.    Expected Outcomes Short Term: Continued assessment and intervention until BP is < 140/59mm HG in hypertensive participants. < 130/81mm HG in hypertensive participants with diabetes, heart failure or chronic kidney disease.;Long Term: Maintenance of blood pressure at goal levels.    Lipids Yes    Intervention Provide education and support for participant on nutrition & aerobic/resistive exercise along with prescribed medications to achieve LDL 70mg , HDL >40mg .    Expected Outcomes Short Term: Participant states understanding of desired cholesterol values and is compliant with medications prescribed. Participant is following exercise prescription and nutrition guidelines.;Long Term: Cholesterol controlled with medications as prescribed, with individualized exercise RX and with personalized nutrition plan. Value goals: LDL < 70mg , HDL > 40 mg.    Personal Goal Other Yes    Personal Goal Improve heart funtion and be healthier overall.    Intervention Attend CR three days per week and begin a home exercise program.    Expected Outcomes Pt will meet  stated goals.             Core Components/Risk Factors/Patient Goals Review:   Goals and Risk Factor Review     Row Name 08/07/22 1416 09/04/22 1420           Core Components/Risk Factors/Patient Goals Review   Personal Goals Review Lipids;Improve shortness of breath  with ADL's;Tobacco Cessation;Hypertension;Heart Failure;Other;Weight Management/Obesity Lipids;Improve shortness of breath with ADL's;Tobacco Cessation;Hypertension;Heart Failure;Other;Weight Management/Obesity      Review Patient was referred to [facility] with stent placement. She has multiple risk factors for CAD and is participating in the program for risk modification. She has completed 3 sessions with her current weight at 239.4 lbs up 0.4 lbs from her initial weight. Her blood pressure has been well controlled. She has quit smoking but started back in 12/23 due to the anniversary of her son's unexpected death. She smokes average 5/day. Her personal goals for the program are to lose weight; strengthen her heart and be healthier and quit smoking. We will continue to monitor her progress as she works towards meeting these goals. Patient has completed 8 sessions with her current weight at 241.0 lbs up 1.6 lbs since last 30 day review. Her blood pressure continues to be at goal. She continues to smoke on average 5/day. She has not attended since 3/6 due to respiratory illness. Otherwise, she has done well and worked hard when she attended. Her personal goals for the program are to lose weight; strengthen her heart and be healthier and quit smoking. We will continue to monitor her progress as she works towards meeting these goals.      Expected Outcomes Patient will complete the program meeting both personal and program goals. Patient will complete the program meeting both personal and program goals.               Core Components/Risk Factors/Patient Goals at Discharge (Final Review):   Goals and Risk Factor Review - 09/04/22 1420        Core Components/Risk Factors/Patient Goals Review   Personal Goals Review Lipids;Improve shortness of breath with ADL's;Tobacco Cessation;Hypertension;Heart Failure;Other;Weight Management/Obesity    Review Patient has completed 8 sessions with her current weight at 241.0 lbs up 1.6 lbs since last 30 day review. Her blood pressure continues to be at goal. She continues to smoke on average 5/day. She has not attended since 3/6 due to respiratory illness. Otherwise, she has done well and worked hard when she attended. Her personal goals for the program are to lose weight; strengthen her heart and be healthier and quit smoking. We will continue to monitor her progress as she works towards meeting these goals.    Expected Outcomes Patient will complete the program meeting both personal and program goals.             ITP Comments:  ITP Comments     Row Name 09/11/22 1521 09/11/22 1527         ITP Comments Left patine Left pt a VM regarding her absences from [facility]               Comments: ITP REVIEW Patient is currently not attending. We have attempted to contact her. She has completed 8 sessions. We will continue to monitor and send a letter of discharge soon.

## 2022-09-15 ENCOUNTER — Encounter (HOSPITAL_COMMUNITY): Payer: 59

## 2022-09-18 ENCOUNTER — Encounter (HOSPITAL_COMMUNITY): Payer: 59

## 2022-09-19 NOTE — Addendum Note (Signed)
Encounter addended by: Philis Kendall on: 09/19/2022 11:28 AM  Actions taken: Flowsheet accepted

## 2022-09-20 ENCOUNTER — Encounter (HOSPITAL_COMMUNITY): Payer: 59

## 2022-09-21 NOTE — Progress Notes (Signed)
Discharge Progress Report  Patient Details  Name: Ashley Patrick MRN: [id number] Date of Birth: 1955-08-27 Referring Provider:   Flowsheet Row CARDIAC REHAB PHASE II ORIENTATION from 07/27/2022 in [facility]  Referring Provider Dr. Florene Glen        Number of Visits: 9  Reason for Discharge:  Early Exit:  Lack of attendance  Smoking History:  Social History   Tobacco Use  Smoking Status Former   Packs/day: 0.50   Years: 38.00   Additional pack years: 0.00   Total pack years: 19.00   Types: Cigarettes   Quit date: 06/19/2022   Years since quitting: 0.2  Smokeless Tobacco Never  Tobacco Comments   States she has not smoked this week and is attempting to quit.     Diagnosis:  S/P coronary artery stent placement  ADL [facility]:   Initial Exercise Prescription:  Initial Exercise Prescription - 07/27/22 1300       Date of Initial Exercise RX and Referring Provider   Date 07/27/22    Referring Provider Dr. Florene Glen    Expected Discharge Date 10/20/22      Treadmill   MPH 1.8    Grade 0    Minutes 17      NuStep   Level 1    SPM 60    Minutes 22      Prescription Details   Frequency (times per week) 3    Duration Progress to 30 minutes of continuous aerobic without signs/symptoms of physical distress      Intensity   THRR 40-80% of Max Heartrate 62-125    Ratings of Perceived Exertion 11-13    Perceived Dyspnea 0-4      Resistance Training   Training Prescription Yes    Weight 3    Reps 10-15             Discharge Exercise Prescription (Final Exercise Prescription Changes):  Exercise Prescription Changes - 08/23/22 1500       Response to Exercise   Blood Pressure (Admit) 108/62    Blood Pressure (Exercise) 146/62    Blood Pressure (Exit) 102/60    Heart Rate (Admit) 92 bpm    Heart Rate (Exercise) 116 bpm    Heart Rate (Exit) 101 bpm    Rating of Perceived Exertion (Exercise) 12    Comments .    Duration Continue with 30 min  of aerobic exercise without signs/symptoms of physical distress.    Intensity THRR unchanged      Progression   Progression Continue to progress workloads to maintain intensity without signs/symptoms of physical distress.      Resistance Training   Training Prescription Yes    Weight 3    Reps 10-15    Time 10 Minutes      Treadmill   MPH 2.5    Grade 0    Minutes 17    METs 2.91      NuStep   Level 2    SPM 101    Minutes 22    METs 2.99             Functional Capacity:  6 Minute Walk     Row Name 07/27/22 1314         6 Minute Walk   Phase Initial     Distance 1200 feet     Walk Time 6 minutes     # of Rest Breaks 0     MPH 2.27  METS 2.54     RPE 14     VO2 Peak 8.89     Symptoms Yes (comment)     Comments Slight SOB while walking     Resting HR 88 bpm     Resting BP 120/70     Resting Oxygen Saturation  99 %     Exercise Oxygen Saturation  during 6 min walk 98 %     Max Ex. HR 106 bpm     Max Ex. BP 140/70     2 Minute Post BP 132/70              Psychological, QOL, Others - Outcomes: PHQ 2/9:    09/07/2022   11:04 AM 07/27/2022    1:09 PM 06/22/2022   10:58 AM 02/16/2022    3:31 PM 01/09/2022    5:07 PM  Depression screen PHQ 2/9  Decreased Interest  1     Down, Depressed, Hopeless  2     PHQ - 2 Score  3     Altered sleeping  2     Tired, decreased energy  2     Change in appetite  0     Feeling bad or failure about yourself   2     Trouble concentrating  2     Moving slowly or fidgety/restless  2     Suicidal thoughts  2     PHQ-9 Score  15     Difficult doing work/chores  Somewhat difficult        Information is confidential and restricted. Go to Review Flowsheets to unlock data.    Quality of Life:  Quality of Life - 07/27/22 1317       Quality of Life   Select Quality of Life      Quality of Life Scores   Health/Function Pre 20.4 %    Socioeconomic Pre 27.43 %    Psych/Spiritual Pre 26.57 %    Family Pre 30 %     GLOBAL Pre 24.53 %             Personal Goals: Goals established at orientation with interventions provided to work toward goal.  Personal Goals and Risk Factors at Admission - 07/27/22 1334       Core Components/Risk Factors/Patient Goals on Admission    Weight Management Yes    Intervention Weight Management: Develop a combined nutrition and exercise program designed to reach desired caloric intake, while maintaining appropriate intake of nutrient and fiber, sodium and fats, and appropriate energy expenditure required for the weight goal.;Weight Management: Provide education and appropriate resources to help participant work on and attain dietary goals.;Weight Management/Obesity: Establish reasonable short term and long term weight goals.;Obesity: Provide education and appropriate resources to help participant work on and attain dietary goals.    Expected Outcomes Short Term: Continue to assess and modify interventions until short term weight is achieved;Long Term: Adherence to nutrition and physical activity/exercise program aimed toward attainment of established weight goal;Weight Maintenance: Understanding of the daily nutrition guidelines, which includes 25-35% calories from fat, 7% or less cal from saturated fats, less than 200mg  cholesterol, less than 1.5gm of sodium, & 5 or more servings of fruits and vegetables daily;Weight Loss: Understanding of general recommendations for a balanced deficit meal plan, which promotes 1-2 lb weight loss per week and includes a negative energy balance of [phone] kcal/d;Understanding recommendations for meals to include 15-35% energy as protein, 25-35% energy from fat, 35-60% energy from carbohydrates,  less than 200mg  of dietary cholesterol, 20-35 gm of total fiber daily;Understanding of distribution of calorie intake throughout the day with the consumption of 4-5 meals/snacks    Tobacco Cessation Yes    Number of packs per day 1/3    Intervention Assist  the participant in steps to quit. Provide individualized education and counseling about committing to Tobacco Cessation, relapse prevention, and pharmacological support that can be provided by physician.;Advice worker, assist with locating and accessing local/national Quit Smoking programs, and support quit date choice.    Expected Outcomes Short Term: Will demonstrate readiness to quit, by selecting a quit date.;Short Term: Will quit all tobacco product use, adhering to prevention of relapse plan.;Long Term: Complete abstinence from all tobacco products for at least 12 months from quit date.    Improve shortness of breath with ADL's Yes    Intervention Provide education, individualized exercise plan and daily activity instruction to help decrease symptoms of SOB with activities of daily living.    Expected Outcomes Short Term: Improve cardiorespiratory fitness to achieve a reduction of symptoms when performing ADLs;Long Term: Be able to perform more ADLs without symptoms or delay the onset of symptoms    Heart Failure Yes    Intervention Provide a combined exercise and nutrition program that is supplemented with education, support and counseling about heart failure. Directed toward relieving symptoms such as shortness of breath, decreased exercise tolerance, and extremity edema.    Expected Outcomes Improve functional capacity of life;Short term: Attendance in program 2-3 days a week with increased exercise capacity. Reported lower sodium intake. Reported increased fruit and vegetable intake. Reports medication compliance.;Short term: Daily weights obtained and reported for increase. Utilizing diuretic protocols set by physician.;Long term: Adoption of self-care skills and reduction of barriers for early signs and symptoms recognition and intervention leading to self-care maintenance.    Hypertension Yes    Intervention Provide education on lifestyle modifcations including regular physical  activity/exercise, weight management, moderate sodium restriction and increased consumption of fresh fruit, vegetables, and low fat dairy, alcohol moderation, and smoking cessation.;Monitor prescription use compliance.    Expected Outcomes Short Term: Continued assessment and intervention until BP is < 140/24mm HG in hypertensive participants. < 130/17mm HG in hypertensive participants with diabetes, heart failure or chronic kidney disease.;Long Term: Maintenance of blood pressure at goal levels.    Lipids Yes    Intervention Provide education and support for participant on nutrition & aerobic/resistive exercise along with prescribed medications to achieve LDL 70mg , HDL >40mg .    Expected Outcomes Short Term: Participant states understanding of desired cholesterol values and is compliant with medications prescribed. Participant is following exercise prescription and nutrition guidelines.;Long Term: Cholesterol controlled with medications as prescribed, with individualized exercise RX and with personalized nutrition plan. Value goals: LDL < 70mg , HDL > 40 mg.    Personal Goal Other Yes    Personal Goal Improve heart funtion and be healthier overall.    Intervention Attend CR three days per week and begin a home exercise program.    Expected Outcomes Pt will meet stated goals.              Personal Goals Discharge:  Goals and Risk Factor Review     Row Name 08/07/22 1416 09/04/22 1420           Core Components/Risk Factors/Patient Goals Review   Personal Goals Review Lipids;Improve shortness of breath with ADL's;Tobacco Cessation;Hypertension;Heart Failure;Other;Weight Management/Obesity Lipids;Improve shortness of breath with ADL's;Tobacco Cessation;Hypertension;Heart Failure;Other;Weight Management/Obesity  Review Patient was referred to CR with stent placement. She has multiple risk factors for CAD and is participating in the program for risk modification. She has completed 3 sessions  with her current weight at 239.4 lbs up 0.4 lbs from her initial weight. Her blood pressure has been well controlled. She has quit smoking but started back in 12/23 due to the anniversary of her son's unexpected death. She smokes average 5/day. Her personal goals for the program are to lose weight; strengthen her heart and be healthier and quit smoking. We will continue to monitor her progress as she works towards meeting these goals. Patient has completed 8 sessions with her current weight at 241.0 lbs up 1.6 lbs since last 30 day review. Her blood pressure continues to be at goal. She continues to smoke on average 5/day. She has not attended since 3/6 due to respiratory illness. Otherwise, she has done well and worked hard when she attended. Her personal goals for the program are to lose weight; strengthen her heart and be healthier and quit smoking. We will continue to monitor her progress as she works towards meeting these goals.      Expected Outcomes Patient will complete the program meeting both personal and program goals. Patient will complete the program meeting both personal and program goals.               Exercise Goals and Review:  Exercise Goals     Row Name 07/27/22 1316 08/14/22 1500 09/11/22 1520         Exercise Goals   Increase Physical Activity Yes Yes Yes     Intervention Provide advice, education, support and counseling about physical activity/exercise needs.;Develop an individualized exercise prescription for aerobic and resistive training based on initial evaluation findings, risk stratification, comorbidities and participant's personal goals. Provide advice, education, support and counseling about physical activity/exercise needs.;Develop an individualized exercise prescription for aerobic and resistive training based on initial evaluation findings, risk stratification, comorbidities and participant's personal goals. Provide advice, education, support and counseling about  physical activity/exercise needs.;Develop an individualized exercise prescription for aerobic and resistive training based on initial evaluation findings, risk stratification, comorbidities and participant's personal goals.     Expected Outcomes Short Term: Attend rehab on a regular basis to increase amount of physical activity.;Long Term: Add in home exercise to make exercise part of routine and to increase amount of physical activity.;Long Term: Exercising regularly at least 3-5 days a week. Short Term: Attend rehab on a regular basis to increase amount of physical activity.;Long Term: Add in home exercise to make exercise part of routine and to increase amount of physical activity.;Long Term: Exercising regularly at least 3-5 days a week. Short Term: Attend rehab on a regular basis to increase amount of physical activity.;Long Term: Add in home exercise to make exercise part of routine and to increase amount of physical activity.;Long Term: Exercising regularly at least 3-5 days a week.     Increase Strength and Stamina Yes Yes Yes     Intervention Provide advice, education, support and counseling about physical activity/exercise needs.;Develop an individualized exercise prescription for aerobic and resistive training based on initial evaluation findings, risk stratification, comorbidities and participant's personal goals. Provide advice, education, support and counseling about physical activity/exercise needs.;Develop an individualized exercise prescription for aerobic and resistive training based on initial evaluation findings, risk stratification, comorbidities and participant's personal goals. Provide advice, education, support and counseling about physical activity/exercise needs.;Develop an individualized exercise prescription for aerobic and resistive  training based on initial evaluation findings, risk stratification, comorbidities and participant's personal goals.     Expected Outcomes Short Term:  Increase workloads from initial exercise prescription for resistance, speed, and METs.;Short Term: Perform resistance training exercises routinely during rehab and add in resistance training at home;Long Term: Improve cardiorespiratory fitness, muscular endurance and strength as measured by increased METs and functional capacity (6MWT) Short Term: Increase workloads from initial exercise prescription for resistance, speed, and METs.;Short Term: Perform resistance training exercises routinely during rehab and add in resistance training at home;Long Term: Improve cardiorespiratory fitness, muscular endurance and strength as measured by increased METs and functional capacity (6MWT) Short Term: Increase workloads from initial exercise prescription for resistance, speed, and METs.;Short Term: Perform resistance training exercises routinely during rehab and add in resistance training at home;Long Term: Improve cardiorespiratory fitness, muscular endurance and strength as measured by increased METs and functional capacity (6MWT)     Able to understand and use rate of perceived exertion (RPE) scale Yes Yes Yes     Intervention Provide education and explanation on how to use RPE scale Provide education and explanation on how to use RPE scale Provide education and explanation on how to use RPE scale     Expected Outcomes Long Term:  Able to use RPE to guide intensity level when exercising independently;Short Term: Able to use RPE daily in rehab to express subjective intensity level Long Term:  Able to use RPE to guide intensity level when exercising independently;Short Term: Able to use RPE daily in rehab to express subjective intensity level Long Term:  Able to use RPE to guide intensity level when exercising independently;Short Term: Able to use RPE daily in rehab to express subjective intensity level     Knowledge and understanding of Target Heart Rate Range (THRR) Yes Yes Yes     Intervention Provide education and  explanation of THRR including how the numbers were predicted and where they are located for reference Provide education and explanation of THRR including how the numbers were predicted and where they are located for reference Provide education and explanation of THRR including how the numbers were predicted and where they are located for reference     Expected Outcomes Short Term: Able to state/look up THRR;Short Term: Able to use daily as guideline for intensity in rehab;Long Term: Able to use THRR to govern intensity when exercising independently Short Term: Able to state/look up THRR;Short Term: Able to use daily as guideline for intensity in rehab;Long Term: Able to use THRR to govern intensity when exercising independently Short Term: Able to state/look up THRR;Short Term: Able to use daily as guideline for intensity in rehab;Long Term: Able to use THRR to govern intensity when exercising independently     Able to check pulse independently Yes Yes Yes     Intervention Provide education and demonstration on how to check pulse in carotid and radial arteries.;Review the importance of being able to check your own pulse for safety during independent exercise Provide education and demonstration on how to check pulse in carotid and radial arteries.;Review the importance of being able to check your own pulse for safety during independent exercise Provide education and demonstration on how to check pulse in carotid and radial arteries.;Review the importance of being able to check your own pulse for safety during independent exercise     Expected Outcomes Short Term: Able to explain why pulse checking is important during independent exercise;Long Term: Able to check pulse independently and accurately Short Term:  Able to explain why pulse checking is important during independent exercise;Long Term: Able to check pulse independently and accurately Short Term: Able to explain why pulse checking is important during  independent exercise;Long Term: Able to check pulse independently and accurately     Understanding of Exercise Prescription Yes Yes Yes     Intervention Provide education, explanation, and written materials on patient's individual exercise prescription Provide education, explanation, and written materials on patient's individual exercise prescription Provide education, explanation, and written materials on patient's individual exercise prescription     Expected Outcomes Short Term: Able to explain program exercise prescription;Long Term: Able to explain home exercise prescription to exercise independently Short Term: Able to explain program exercise prescription;Long Term: Able to explain home exercise prescription to exercise independently Short Term: Able to explain program exercise prescription;Long Term: Able to explain home exercise prescription to exercise independently              Exercise Goals Re-Evaluation:  Exercise Goals Re-Evaluation     Row Name 08/14/22 1500 09/11/22 1520           Exercise Goal Re-Evaluation   Exercise Goals Review Increase Physical Activity;Increase Strength and Stamina;Able to understand and use rate of perceived exertion (RPE) scale;Knowledge and understanding of Target Heart Rate Range (THRR);Able to check pulse independently;Understanding of Exercise Prescription Increase Physical Activity;Able to understand and use rate of perceived exertion (RPE) scale;Increase Strength and Stamina;Knowledge and understanding of Target Heart Rate Range (THRR);Able to check pulse independently;Understanding of Exercise Prescription      Comments Pt has completed 5 sessions of cardiac rehab. She enjoys coming to class and is eager to improve her health. She has recently pushed herself too hard in class and was asked to slow down on the treadmill and stepper due to her HR going over her THR. She is currently exercising at 2.84 METs on the stepper. Will continue to monitor  and progress as able. Pt has completed 8 sessions of caridac rehab. She has not been recently first due to respiratory illness but has not returned or called recently. She was exercising at 2.99 METs on the stepper. Will update when able.      Expected Outcomes Through exercise at rehab and home, the patient will meet thier stated goals Through exercise at rehab and home, the patient will meet thier stated goals               Nutrition & Weight - Outcomes:  Pre Biometrics - 07/27/22 1316       Pre Biometrics   Height 5\' 6"  (1.676 m)    Weight 239 lb 3.2 oz (108.5 kg)    Waist Circumference 45 inches    Hip Circumference 47.8 inches    Waist to Hip Ratio 0.94 %    BMI (Calculated) 38.63    Triceps Skinfold 45 mm    % Body Fat 50.2 %    Grip Strength 34.4 kg    Flexibility 0 in    Single Leg Stand 2.24 seconds              Nutrition:  Nutrition Therapy & Goals - 08/07/22 1408       Personal Nutrition Goals   Comments Patient's diet assessment score was 84. We have educational sessions on heart healthy nutrition with handouts and offer assistance with RD referral if patient is interested.      Intervention Plan   Intervention Nutrition handout(s) given to patient.    Expected Outcomes Short  Term Goal: Understand basic principles of dietary content, such as calories, fat, sodium, cholesterol and nutrients.             Nutrition Discharge:  Nutrition Assessments - 07/27/22 1258       MEDFICTS Scores   Pre Score 84             Education Questionnaire Score:  Knowledge Questionnaire Score - 07/27/22 1254       Knowledge Questionnaire Score   Pre Score 15/28             Pt discharged from [facility] after 9 sessions due to lack of attendance. She last attended on 08/23/2022. Multiple VMs have been left for her, and she has been sent a letter regarding her discharge.

## 2022-09-22 ENCOUNTER — Encounter (HOSPITAL_COMMUNITY): Payer: 59

## 2022-09-25 ENCOUNTER — Encounter (HOSPITAL_COMMUNITY): Payer: 59

## 2022-09-27 ENCOUNTER — Encounter (HOSPITAL_COMMUNITY): Payer: 59

## 2022-09-29 ENCOUNTER — Encounter (HOSPITAL_COMMUNITY): Payer: 59

## 2022-10-02 ENCOUNTER — Encounter (HOSPITAL_COMMUNITY): Payer: 59

## 2022-10-04 ENCOUNTER — Encounter (HOSPITAL_COMMUNITY): Payer: 59

## 2022-10-05 ENCOUNTER — Ambulatory Visit: Payer: 59 | Admitting: Internal Medicine

## 2022-10-06 ENCOUNTER — Encounter (HOSPITAL_COMMUNITY): Payer: 59

## 2022-10-06 ENCOUNTER — Ambulatory Visit: Payer: Medicaid Other | Admitting: Internal Medicine

## 2022-10-09 ENCOUNTER — Encounter (HOSPITAL_COMMUNITY): Payer: 59

## 2022-10-11 ENCOUNTER — Encounter (HOSPITAL_COMMUNITY): Payer: 59

## 2022-10-13 ENCOUNTER — Encounter (HOSPITAL_COMMUNITY): Payer: 59

## 2022-10-16 ENCOUNTER — Other Ambulatory Visit: Payer: Self-pay | Admitting: Psychiatry

## 2022-10-16 ENCOUNTER — Encounter (HOSPITAL_COMMUNITY): Payer: 59

## 2022-10-18 ENCOUNTER — Encounter (HOSPITAL_COMMUNITY): Payer: 59

## 2022-10-20 ENCOUNTER — Encounter: Payer: Self-pay | Admitting: Pulmonary Disease

## 2022-10-20 ENCOUNTER — Ambulatory Visit (INDEPENDENT_AMBULATORY_CARE_PROVIDER_SITE_OTHER): Payer: 59 | Admitting: Pulmonary Disease

## 2022-10-20 ENCOUNTER — Encounter (HOSPITAL_COMMUNITY): Payer: 59

## 2022-10-20 ENCOUNTER — Institutional Professional Consult (permissible substitution): Payer: 59 | Admitting: Pulmonary Disease

## 2022-10-20 VITALS — BP 104/58 | HR 97 | Ht 66.0 in | Wt 238.0 lb

## 2022-10-20 DIAGNOSIS — R0602 Shortness of breath: Secondary | ICD-10-CM | POA: Diagnosis not present

## 2022-10-20 NOTE — Progress Notes (Signed)
Ashley Patrick    [id number]    1955/07/04  Primary Care Physician:McCorkle, Cassell Clement, FNP  Referring Physician: Marylynn Pearson, FNP [address] [place],  [place] [address]  Chief complaint:   Patient being seen for shortness of breath  HPI:  In for follow-up for recent hospitalization for shortness of breath and hypoxemic respiratory failure Was treated for decompensated heart failure  She gets short of breath with moderate exertion Has not been very active unable to tolerate more than a couple of blocks  She recently did participate in cardiac rehab but stopped going after a while  She does have a history of coronary artery disease for which she had a stent placed, diastolic dysfunction on echo  21-HYQM-VHQI smoker, still smokes about a few sticks to half a pack a day  No pertinent occupational history  PFT previously in 2022 did not reveal significant obstructive disease  Currently on 0 Tropium and does have a nebulizer that she uses up to 3 times a day for shortness of breath She does have occasional wheezing, occasional cough  Outpatient Encounter Medications as of 10/20/2022  Medication Sig   acetaminophen (TYLENOL) 650 MG CR tablet Take 650 mg by mouth every 8 (eight) hours as needed for pain.   albuterol (VENTOLIN HFA) 108 (90 Base) MCG/ACT inhaler Inhale 2 puffs into the lungs every 4 (four) hours as needed for wheezing or shortness of breath.   amLODipine (NORVASC) 5 MG tablet Take 5 mg by mouth every morning.   aspirin EC 81 MG tablet Take 81 mg by mouth daily. Swallow whole.   cariprazine (VRAYLAR) 1.5 MG capsule Take 1 capsule (1.5 mg total) by mouth daily.   celecoxib (CELEBREX) 100 MG capsule Take 100 mg by mouth 2 (two) times daily as needed for mild pain.   clopidogrel (PLAVIX) 75 MG tablet Take 1 tablet (75 mg total) by mouth daily with breakfast.   Deutetrabenazine 6 MG TABS Take 6 mg by mouth daily.   furosemide (LASIX) 80 MG  tablet Take 1 tablet (80 mg total) by mouth in the morning. & 80 mg at 5:00 pm   metFORMIN (GLUCOPHAGE) 500 MG tablet Take 500 mg by mouth every morning.   metoprolol succinate (TOPROL-XL) 50 MG 24 hr tablet Take 1 tablet (50 mg total) by mouth daily.   Multiple Vitamins-Minerals (MULTIVITAMIN WITH MINERALS) tablet Take 1 tablet by mouth daily.   olmesartan (BENICAR) 5 MG tablet Take 1 tablet (5 mg total) by mouth daily.   pantoprazole (PROTONIX) 40 MG tablet Take 1 tablet (40 mg total) by mouth daily.   potassium chloride SA (KLOR-CON M) 20 MEQ tablet Take 1 tablet (20 mEq total) by mouth daily.   rosuvastatin (CRESTOR) 40 MG tablet Take 1 tablet (40 mg total) by mouth daily.   SPIRIVA RESPIMAT 1.25 MCG/ACT AERS Inhale 2 puffs into the lungs daily.   traZODone (DESYREL) 150 MG tablet Take 300 mg by mouth at bedtime.   vitamin B-12 (CYANOCOBALAMIN) 1000 MCG tablet Take 1,000 mcg by mouth daily.   zaleplon (SONATA) 5 MG capsule Take 1 capsule (5 mg total) by mouth at bedtime as needed for sleep.   No facility-administered encounter medications on file as of 10/20/2022.    Allergies as of 10/20/2022   (No Known Allergies)    Past Medical History:  Diagnosis Date   Anxiety    Aortic atherosclerosis (HCC)    Arthritis    Asthma  Back pain    Bipolar disorder (HCC)    Cancer (HCC) 2014   Colon Cancer   Constipation    Depression    Dyspnea    Hypercholesteremia    Hypertension    Insomnia    Lateral epicondylitis  of elbow    right   Migraine headache    Nicotine addiction    Obesity    History of   OD (overdose of drug)    hospitalized for 11 days    Panic attacks    Psychosis (HCC)    Schizophrenia (HCC)     Past Surgical History:  Procedure Laterality Date   ABDOMINAL HYSTERECTOMY     BIOPSY N/A 08/13/2014   Procedure: BIOPSY;  Surgeon: Corbin Ade, MD;  Location: [facility];  Service: Endoscopy;  Laterality: N/A;   BREAST BIOPSY  10/18/2011   Procedure: BREAST  BIOPSY;  Surgeon: Dalia Heading, MD;  Location: [facility];  Service: General;  Laterality: Left;   COLON RESECTION N/A 06/23/2013   Procedure: HAND ASSISTED LAPAROSCOPIC PARTIAL COLECTOMY;  Surgeon: Dalia Heading, MD;  Location: [facility];  Service: General;  Laterality: N/A;   COLONOSCOPY     COLONOSCOPY N/A 05/29/2013   [id number] mass most likely representing colorectal cancer S/ P biospy.Multiple colonic and rectal polyps removed/treated as described above. Colonic diverticulosis   COLONOSCOPY WITH PROPOFOL N/A 08/13/2014   RMR: Status post sigmoid colectomy. Multiple colonic polyps removed as described above. Redundant colon. Pan colonic diverticuloisi   COLONOSCOPY WITH PROPOFOL N/A 05/22/2016   Surgeon: Corbin Ade, MD; 2 polyps removed, but did not survive pathology processing.  Surgical anastomosis in the sigmoid colon noted, appeared patent, question somewhat narrowed just proximal anastomosis.  Repeat in 3 years.   COLONOSCOPY WITH PROPOFOL N/A 08/05/2021   Procedure: COLONOSCOPY WITH PROPOFOL;  Surgeon: Lanelle Bal, DO;  [facility];  Service: Endoscopy;  Laterality: N/A;  10:00am   CORONARY STENT INTERVENTION N/A 06/29/2022   Procedure: CORONARY STENT INTERVENTION;  Surgeon: Corky Crafts, MD;  Location: [facility] INVASIVE CV LAB;  Service: Cardiovascular;  Laterality: N/A;   CORONARY ULTRASOUND/IVUS N/A 06/29/2022   Procedure: Intravascular Ultrasound/IVUS;  Surgeon: Corky Crafts, MD;  Location: [facility] INVASIVE CV LAB;  Service: Cardiovascular;  Laterality: N/A;   ESOPHAGEAL DILATION N/A 08/13/2014   Procedure: ESOPHAGEAL MALONEY DILATION 54 FRENCH;  Surgeon: Corbin Ade, MD;  Location: [facility];  Service: Endoscopy;  Laterality: N/A;   ESOPHAGOGASTRODUODENOSCOPY N/A 12/26/2012   [id number] reflux esophagitis. Gastric and duodenal bulbar erosions-status post gastric biopsynegative H.pylori   ESOPHAGOGASTRODUODENOSCOPY (EGD) WITH PROPOFOL N/A 08/13/2014   RMR:  Small benign cystic-appearing lesion in distal esophagus of doubtful clinical significance, otherwise normal esophagus, status post Maloney dilation. Small hiatal hernia, some retained gastric contents (query delayed gastric emptying.)   partial hysterectomy     POLYPECTOMY N/A 08/13/2014   Procedure: POLYPECTOMY;  Surgeon: Corbin Ade, MD;  Location: [facility];  Service: Endoscopy;  Laterality: N/A;   POLYPECTOMY  08/05/2021   Procedure: POLYPECTOMY;  Surgeon: Lanelle Bal, DO;  [facility];  Service: Endoscopy;;   RIGHT/LEFT HEART CATH AND CORONARY ANGIOGRAPHY N/A 06/29/2022   Procedure: RIGHT/LEFT HEART CATH AND CORONARY ANGIOGRAPHY;  Surgeon: Corky Crafts, MD;  Location: [facility] INVASIVE CV LAB;  Service: Cardiovascular;  Laterality: N/A;    Family History  Problem Relation Age of Onset   Stroke Mother    Drug abuse Mother    Arthritis Mother  Arthritis Father    Colon cancer Father        diagnosed in his 63s   Drug abuse Father    Hypertension Sister    Asthma Sister    HIV/AIDS Brother    Colon cancer Paternal Uncle        50s   Anesthesia problems Neg Hx    Hypotension Neg Hx    Malignant hyperthermia Neg Hx    Pseudochol deficiency Neg Hx     Social History   Socioeconomic History   Marital status: Legally Separated    Spouse name: Not on file   Number of children: 3   Years of education: Not on file   Highest education level: Not on file  Occupational History   Not on file  Tobacco Use   Smoking status: Former    Packs/day: 0.50    Years: 38.00    Additional pack years: 0.00    Total pack years: 19.00    Types: Cigarettes    Quit date: 06/19/2022    Years since quitting: 0.3   Smokeless tobacco: Never   Tobacco comments:    States she has not smoked this week and is attempting to quit.   Vaping Use   Vaping Use: Never used  Substance and Sexual Activity   Alcohol use: No    Alcohol/week: 0.0 standard drinks of alcohol   Drug use: No    Sexual activity: Not Currently    Birth control/protection: Surgical    Comment: hyst  Other Topics Concern   Not on file  Social History Narrative   Not on file   Social Determinants of Health   Financial Resource Strain: Low Risk  (06/01/2021)   Overall Financial Resource Strain (CARDIA)    Difficulty of Paying Living Expenses: Not very hard  Food Insecurity: No Food Insecurity (06/28/2022)   Hunger Vital Sign    Worried About Running Out of Food in the Last Year: Never true    Ran Out of Food in the Last Year: Never true  Transportation Needs: No Transportation Needs (06/28/2022)   PRAPARE - Administrator, Civil Service (Medical): No    Lack of Transportation (Non-Medical): No  Physical Activity: Insufficiently Active (06/01/2021)   Exercise Vital Sign    Days of Exercise per Week: 1 day    Minutes of Exercise per Session: 10 min  Stress: No Stress Concern Present (06/01/2021)   [employer] of Occupational Health - Occupational Stress Questionnaire    Feeling of Stress : Only a little  Social Connections: Moderately Isolated (06/01/2021)   Social Connection and Isolation Panel [NHANES]    Frequency of Communication with Friends and Family: Twice a week    Frequency of Social Gatherings with Friends and Family: Once a week    Attends Religious Services: 1 to 4 times per year    Active Member of [employer] or Organizations: No    Attends Banker Meetings: Never    Marital Status: Separated  Intimate Partner Violence: Not At Risk (06/28/2022)   Humiliation, Afraid, Rape, and Kick questionnaire    Fear of Current or Ex-Partner: No    Emotionally Abused: No    Physically Abused: No    Sexually Abused: No    Review of Systems  Respiratory:  Positive for cough, shortness of breath and wheezing.     There were no vitals filed for this visit.   Physical Exam Constitutional:      Appearance: She  is obese.  HENT:     Head: Normocephalic.      Mouth/Throat:     Mouth: Mucous membranes are moist.  Eyes:     General: No scleral icterus. Cardiovascular:     Rate and Rhythm: Normal rate and regular rhythm.     Heart sounds: No murmur heard.    No friction rub.  Pulmonary:     Effort: No respiratory distress.     Breath sounds: No stridor. No wheezing or rhonchi.  Musculoskeletal:     Cervical back: No rigidity or tenderness.  Neurological:     Mental Status: She is alert.  Psychiatric:        Mood and Affect: Mood normal.      Data Reviewed: Recent hospital records reviewed reviewed Discharge summary 06/30/2022 reviewed  X-ray 06/27/2022 with no acute infiltrate  Pulmonary function test 08/12/2020 within normal limits  Assessment:   Recent hospitalization for hypoxemic respiratory failure -Improved  Smoker -19-pack-year smoking history -Counseled about the need to quit smoking  Concern for obstructive lung disease -PFT 2022 was within normal limits  There is an element of deconditioning -Discussed importance of graded activities to optimize functional state  Coronary artery disease -S/p stent placement  Class III obesity  Type 2 diabetes Hypertension  Heart failure with preserved ejection fraction  History of anxiety  Plan/Recommendations: Schedule for pulmonary function test  Graded exercise as tolerated  Importance of smoking cessation  Weight loss efforts  Continue tiotropium, and albuterol as needed  Importance of exercising regularly, importance of quitting smoking was discussed  I will see her back in about 3 months  Encouraged to call with significant concerns  Virl Diamond MD [facility] 10/20/2022, 9:57 AM  CC: Marylynn Pearson, FNP

## 2022-10-20 NOTE — Patient Instructions (Signed)
I will see you back in 3 months  Regular exercises as tolerated  Continue Spiriva Albuterol as needed  Schedule for PFT-full PFT can be done in [place]  You have to quit smoking to help yourself get better  Call us with significant concerns

## 2022-10-23 ENCOUNTER — Encounter: Payer: Self-pay | Admitting: Internal Medicine

## 2022-10-23 ENCOUNTER — Encounter (HOSPITAL_COMMUNITY): Payer: 59 | Attending: Internal Medicine | Admitting: Internal Medicine

## 2022-10-23 VITALS — BP 114/68 | HR 92 | Ht 66.0 in | Wt 240.4 lb

## 2022-10-23 DIAGNOSIS — I2510 Atherosclerotic heart disease of native coronary artery without angina pectoris: Secondary | ICD-10-CM | POA: Insufficient documentation

## 2022-10-23 DIAGNOSIS — I10 Essential (primary) hypertension: Secondary | ICD-10-CM | POA: Diagnosis not present

## 2022-10-23 DIAGNOSIS — E7849 Other hyperlipidemia: Secondary | ICD-10-CM | POA: Insufficient documentation

## 2022-10-23 DIAGNOSIS — I5032 Chronic diastolic (congestive) heart failure: Secondary | ICD-10-CM | POA: Diagnosis not present

## 2022-10-23 MED ORDER — FUROSEMIDE 40 MG PO TABS: 40.0000 mg | ORAL_TABLET | Freq: Two times a day (BID) | ORAL | 1 refills | Status: DC

## 2022-10-23 NOTE — Progress Notes (Signed)
Cardiology Office Note  Date: 10/23/2022   ID: Ashley Patrick, DOB 24-Jan-1956, MRN [id number]  PCP:  Marylynn Pearson, FNP  Cardiologist:  Marjo Bicker, MD Electrophysiologist:  None   Reason for Office Visit: Follow-up of CAD   History of Present Illness: Ashley Patrick is a 67 y.o. female known to have stable ischemic heart disease manifested by diastolic heart failure exacerbation in 06/2022 s/p LCx PCI with normal LVEF, HTN, DM 2, chronic diastolic heart failure, HLD, major depression is here for follow-up visit.  Denied any symptoms of angina or DOE since PCI. She does have some chest pains at rest but not with exertion. No orthopnea or PND. Otherwise no syncope, leg swelling and palpitations. Compliant with medications and has no side effects.  Past Medical History:  Diagnosis Date   Anxiety    Aortic atherosclerosis (HCC)    Arthritis    Asthma    Back pain    Bipolar disorder (HCC)    Cancer (HCC) 2014   Colon Cancer   Constipation    Depression    Dyspnea    Hypercholesteremia    Hypertension    Insomnia    Lateral epicondylitis  of elbow    right   Migraine headache    Nicotine addiction    Obesity    History of   OD (overdose of drug)    hospitalized for 11 days    Panic attacks    Psychosis (HCC)    Schizophrenia (HCC)     Past Surgical History:  Procedure Laterality Date   ABDOMINAL HYSTERECTOMY     BIOPSY N/A 08/13/2014   Procedure: BIOPSY;  Surgeon: Corbin Ade, MD;  Location: [facility];  Service: Endoscopy;  Laterality: N/A;   BREAST BIOPSY  10/18/2011   Procedure: BREAST BIOPSY;  Surgeon: Dalia Heading, MD;  Location: [facility];  Service: General;  Laterality: Left;   COLON RESECTION N/A 06/23/2013   Procedure: HAND ASSISTED LAPAROSCOPIC PARTIAL COLECTOMY;  Surgeon: Dalia Heading, MD;  Location: [facility];  Service: General;  Laterality: N/A;   COLONOSCOPY     COLONOSCOPY N/A 05/29/2013   [id number]:[id number] mass most likely representing  colorectal cancer S/ P biospy.Multiple colonic and rectal polyps removed/treated as described above. Colonic diverticulosis   COLONOSCOPY WITH PROPOFOL N/A 08/13/2014   RMR: Status post sigmoid colectomy. Multiple colonic polyps removed as described above. Redundant colon. Pan colonic diverticuloisi   COLONOSCOPY WITH PROPOFOL N/A 05/22/2016   Surgeon: Corbin Ade, MD; 2 polyps removed, but did not survive pathology processing.  Surgical anastomosis in the sigmoid colon noted, appeared patent, question somewhat narrowed just proximal anastomosis.  Repeat in 3 years.   COLONOSCOPY WITH PROPOFOL N/A 08/05/2021   Procedure: COLONOSCOPY WITH PROPOFOL;  Surgeon: Lanelle Bal, DO;  [facility];  Service: Endoscopy;  Laterality: N/A;  10:00am   CORONARY STENT INTERVENTION N/A 06/29/2022   Procedure: CORONARY STENT INTERVENTION;  Surgeon: Corky Crafts, MD;  Location: [facility] INVASIVE CV LAB;  Service: Cardiovascular;  Laterality: N/A;   CORONARY ULTRASOUND/IVUS N/A 06/29/2022   Procedure: Intravascular Ultrasound/IVUS;  Surgeon: Corky Crafts, MD;  Location: [facility] INVASIVE CV LAB;  Service: Cardiovascular;  Laterality: N/A;   ESOPHAGEAL DILATION N/A 08/13/2014   Procedure: ESOPHAGEAL MALONEY DILATION 54 FRENCH;  Surgeon: Corbin Ade, MD;  Location: [facility];  Service: Endoscopy;  Laterality: N/A;   ESOPHAGOGASTRODUODENOSCOPY N/A 12/26/2012   WGN:[id number] reflux esophagitis. Gastric and duodenal bulbar erosions-status post  gastric biopsynegative H.pylori   ESOPHAGOGASTRODUODENOSCOPY (EGD) WITH PROPOFOL N/A 08/13/2014   RMR: Small benign cystic-appearing lesion in distal esophagus of doubtful clinical significance, otherwise normal esophagus, status post Maloney dilation. Small hiatal hernia, some retained gastric contents (query delayed gastric emptying.)   partial hysterectomy     POLYPECTOMY N/A 08/13/2014   Procedure: POLYPECTOMY;  Surgeon: Corbin Ade, MD;  Location: [facility];   Service: Endoscopy;  Laterality: N/A;   POLYPECTOMY  08/05/2021   Procedure: POLYPECTOMY;  Surgeon: Lanelle Bal, DO;  [facility];  Service: Endoscopy;;   RIGHT/LEFT HEART CATH AND CORONARY ANGIOGRAPHY N/A 06/29/2022   Procedure: RIGHT/LEFT HEART CATH AND CORONARY ANGIOGRAPHY;  Surgeon: Corky Crafts, MD;  Location: [facility] INVASIVE CV LAB;  Service: Cardiovascular;  Laterality: N/A;    Current Outpatient Medications  Medication Sig Dispense Refill   acetaminophen (TYLENOL) 650 MG CR tablet Take 650 mg by mouth every 8 (eight) hours as needed for pain.     albuterol (VENTOLIN HFA) 108 (90 Base) MCG/ACT inhaler Inhale 2 puffs into the lungs every 4 (four) hours as needed for wheezing or shortness of breath.     amLODipine (NORVASC) 5 MG tablet Take 5 mg by mouth every morning.     aspirin EC 81 MG tablet Take 81 mg by mouth daily. Swallow whole.     cariprazine (VRAYLAR) 1.5 MG capsule Take 1 capsule (1.5 mg total) by mouth daily. 90 capsule 0   celecoxib (CELEBREX) 100 MG capsule Take 100 mg by mouth 2 (two) times daily as needed for mild pain.     clopidogrel (PLAVIX) 75 MG tablet Take 1 tablet (75 mg total) by mouth daily with breakfast. 90 tablet 1   Deutetrabenazine 6 MG TABS Take 6 mg by mouth daily. 30 tablet 1   furosemide (LASIX) 40 MG tablet Take 1 tablet (40 mg total) by mouth 2 (two) times daily. 180 tablet 1   ipratropium-albuterol (DUONEB) 0.5-2.5 (3) MG/3ML SOLN Inhale into the lungs.     metFORMIN (GLUCOPHAGE) 500 MG tablet Take 500 mg by mouth every morning.     metoprolol succinate (TOPROL-XL) 50 MG 24 hr tablet Take 1 tablet (50 mg total) by mouth daily. 30 tablet 6   Multiple Vitamins-Minerals (MULTIVITAMIN WITH MINERALS) tablet Take 1 tablet by mouth daily.     olmesartan (BENICAR) 5 MG tablet Take 1 tablet (5 mg total) by mouth daily. 30 tablet 0   pantoprazole (PROTONIX) 40 MG tablet Take 1 tablet (40 mg total) by mouth daily. 90 tablet 1   potassium  chloride SA (KLOR-CON M) 20 MEQ tablet Take 1 tablet (20 mEq total) by mouth daily. 30 tablet 3   rosuvastatin (CRESTOR) 40 MG tablet Take 1 tablet (40 mg total) by mouth daily. 90 tablet 1   SPIRIVA RESPIMAT 1.25 MCG/ACT AERS Inhale 2 puffs into the lungs daily.     traZODone (DESYREL) 150 MG tablet Take 300 mg by mouth at bedtime.     vitamin B-12 (CYANOCOBALAMIN) 1000 MCG tablet Take 1,000 mcg by mouth daily.     zaleplon (SONATA) 5 MG capsule Take 1 capsule (5 mg total) by mouth at bedtime as needed for sleep. 30 capsule 2   No current facility-administered medications for this visit.   Allergies:  Patient has no known allergies.   Social History: The patient  reports that she quit smoking about 4 months ago. Her smoking use included cigarettes. She has a 19.00 pack-year smoking history. She has  never used smokeless tobacco. She reports that she does not drink alcohol and does not use drugs.   Family History: The patient's family history includes Arthritis in her father and mother; Asthma in her sister; Colon cancer in her father and paternal uncle; Drug abuse in her father and mother; HIV/AIDS in her brother; Hypertension in her sister; Stroke in her mother.   ROS:  Please see the history of present illness. Otherwise, complete review of systems is positive for none  All other systems are reviewed and negative.   Physical Exam: VS:  BP 114/68   Pulse 92   Ht 5\' 6"  (1.676 m)   Wt 240 lb 6.4 oz (109 kg)   SpO2 94%   BMI 38.80 kg/m , BMI Body mass index is 38.8 kg/m.  Wt Readings from Last 3 Encounters:  10/23/22 240 lb 6.4 oz (109 kg)  10/20/22 238 lb (108 kg)  08/14/22 244 lb 4.3 oz (110.8 kg)    General: Patient appears comfortable at rest. HEENT: Conjunctiva and lids normal, oropharynx clear with moist mucosa. Neck: Supple, no elevated JVP or carotid bruits, no thyromegaly. Lungs: Clear to auscultation, nonlabored breathing at rest. Cardiac: Regular rate and rhythm, no S3 or  significant systolic murmur, no pericardial rub. Abdomen: Soft, nontender, no hepatomegaly, bowel sounds present, no guarding or rebound. Extremities: No pitting edema, distal pulses 2+. Skin: Warm and dry. Musculoskeletal: No kyphosis. Neuropsychiatric: Alert and oriented x3, affect grossly appropriate.  Recent Labwork: 06/27/2022: Magnesium 1.9 06/28/2022: B Natriuretic Peptide 4.0; TSH 2.735 07/25/2022: ALT 36; AST 26; BUN 11; Creatinine, Ser 1.14; Hemoglobin 12.3; Platelets 230; Potassium 3.9; Sodium 144     Component Value Date/Time   CHOL 138 07/25/2022 1408   TRIG 120 07/25/2022 1408   HDL 52 07/25/2022 1408   CHOLHDL 2.7 07/25/2022 1408   CHOLHDL 2.9 06/28/2022 0456   VLDL 23 06/28/2022 0456   LDLCALC 65 07/25/2022 1408    Other Studies Reviewed Today: RHC/LHC on 1/24 Mild pulmonary hypertension Single-vessel CAD s/p LCx PCI  Echocardiogram in 1/24 Normal LVEF No valve abnormalities  Assessment and Plan: Patient is a 67 year old F known to have stable ischemic heart disease manifested by diastolic heart failure exacerbation in 06/2022 s/p LCx PCI with normal LVEF, HTN, DM 2, chronic diastolic heart failure, HLD, major depression is here for follow-up visit.  # Stable ischemic heart disease manifested by diastolic heart failure exacerbation in 06/2022 s/p LCx PCI with normal LVEF, currently angina free -Continue DAPT for total duration of 6 months to 1 year (aspirin 81 mg and Plavix 75 mg once daily).  Continue rosuvastatin 40 mg nightly. -SL NTG 0.4 mg. -ER precautions for chest pain  # HLD: Continue rosuvastatin 40 mg nightly, goal LDL less than 70.  # Chronic HFpEF: Decrease dose of p.o. Lasix from 80 mg to 40 mg twice daily.  # HTN, controlled: Continue amlodipine 5 mg once daily, metoprolol succinate 50 mg once daily, olmesartan 5 mg once daily.  # Nicotine abuse: Quit smoking completely in 06/2022 after PCI.    I have spent a total of 30 minutes with patient  reviewing chart, EKGs, labs and examining patient as well as establishing an assessment and plan that was discussed with the patient.  > 50% of time was spent in direct patient care.    Medication Adjustments/Labs and Tests Ordered: Current medicines are reviewed at length with the patient today.  Concerns regarding medicines are outlined above.   Tests Ordered: No orders  of the defined types were placed in this encounter.   Medication Changes: Meds ordered this encounter  Medications   furosemide (LASIX) 40 MG tablet    Sig: Take 1 tablet (40 mg total) by mouth 2 (two) times daily.    Dispense:  180 tablet    Refill:  1    10/23/2022 dose decrease    Disposition:  Follow up  6 months  Signed Shakiyah Cirilo Verne Spurr, MD, 10/23/2022 12:48 PM    [facility] [address] [place], [place], [place] [address]

## 2022-10-23 NOTE — Patient Instructions (Addendum)
Medication Instructions:  Your physician has recommended you make the following change in your medication:  Decrease furosemide to 40 mg twice daily Continue other medications the same  Labwork: none  Testing/Procedures: none  Follow-Up: Your physician recommends that you schedule a follow-up appointment in: 6 months  Any Other Special Instructions Will Be Listed Below (If Applicable).  If you need a refill on your cardiac medications before your next appointment, please call your pharmacy.

## 2022-10-26 ENCOUNTER — Other Ambulatory Visit: Payer: Self-pay | Admitting: Psychiatry
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
## 2022-10-30 ENCOUNTER — Encounter: Payer: Self-pay | Admitting: Psychiatry

## 2022-10-30 ENCOUNTER — Ambulatory Visit (INDEPENDENT_AMBULATORY_CARE_PROVIDER_SITE_OTHER): Payer: 59 | Admitting: Psychiatry

## 2022-10-30 VITALS — BP 122/81 | HR 96 | Temp 98.1°F | Ht 66.0 in | Wt 243.8 lb

## 2022-10-30 DIAGNOSIS — G2401 Drug induced subacute dyskinesia: Secondary | ICD-10-CM

## 2022-10-30 DIAGNOSIS — G4700 Insomnia, unspecified: Secondary | ICD-10-CM

## 2022-10-30 DIAGNOSIS — F330 Major depressive disorder, recurrent, mild: Secondary | ICD-10-CM

## 2022-10-30 DIAGNOSIS — T50905A Adverse effect of unspecified drugs, medicaments and biological substances, initial encounter: Secondary | ICD-10-CM

## 2022-10-30 DIAGNOSIS — R635 Abnormal weight gain: Secondary | ICD-10-CM | POA: Diagnosis not present

## 2022-10-30 MED ORDER — DEUTETRABENAZINE 9 MG PO TABS: 9.0000 mg | ORAL_TABLET | Freq: Every day | ORAL | 2 refills | Status: DC

## 2022-10-30 NOTE — Patient Instructions (Signed)
Continue fluoxetine 40 mg daily  Continue Vraylar 1.5 mg daily Continue metformin 500 mg in PM Discontinue tetrabenazine ( Increase deutertrabenazine 9 mg daily  Continue Sonata 5 mg at night as needed for insomnia  Next appointment: 7/22 at 9 am

## 2022-11-01 ENCOUNTER — Telehealth: Payer: Self-pay | Admitting: Psychiatry

## 2022-11-01 ENCOUNTER — Other Ambulatory Visit: Payer: Self-pay | Admitting: Psychiatry

## 2022-11-01 NOTE — Telephone Encounter (Signed)
Received a message from Gerlean Ren, NP, palliative care team. The patient has not been taking fluoxetine, despite stating otherwise. Could you please reach out to her and encourage her to resume this medication? Thanks.

## 2022-11-02 ENCOUNTER — Other Ambulatory Visit: Payer: Self-pay | Admitting: Psychiatry

## 2022-11-02 MED ORDER — FLUOXETINE HCL 40 MG PO CAPS: 40.0000 mg | ORAL_CAPSULE | Freq: Every day | ORAL | 0 refills | Status: DC

## 2022-11-02 NOTE — Telephone Encounter (Signed)
Pt needs a refill on the vraylar

## 2022-11-02 NOTE — Telephone Encounter (Signed)
pt still needs a refill on the vraylar and if you gave her something for her mouth movements she needs that also. please send to pharmacy

## 2022-11-02 NOTE — Telephone Encounter (Signed)
Could you contact the pharmacy. It seems like it was filled on 4/19 for 90 days. She should have enough meds until the next visit (and especially if she it not taking this)

## 2022-11-02 NOTE — Telephone Encounter (Signed)
pt states that she does not have any of the fluoxetine and the vraylar and she states that it was something else for her mouth movement and the pharmacy did not have that one either.

## 2022-11-02 NOTE — Telephone Encounter (Signed)
Ordered fluoxetine 40 mg to the pharmacy. Please advise her to restart this medication (per the team, I was notified that she is taking vraylar).

## 2022-11-02 NOTE — Telephone Encounter (Signed)
I've sent you another message. Please contact the pharmacy regarding the medication. If she filled Vraylar in April for 90 days, she should have enough medication. She should also have refills for deutetrabenazine to help with tardive dyskinesia/mouth movements.

## 2022-11-03 ENCOUNTER — Other Ambulatory Visit: Payer: Self-pay | Admitting: Psychiatry

## 2022-11-03 MED ORDER — CARIPRAZINE HCL 1.5 MG PO CAPS: 1.5000 mg | ORAL_CAPSULE | Freq: Every day | ORAL | 1 refills | Status: AC

## 2022-11-03 NOTE — Telephone Encounter (Signed)
left message that rx was sent.  

## 2022-11-03 NOTE — Telephone Encounter (Signed)
it was last filled on 10-06-22 for #30 pills

## 2022-11-27 ENCOUNTER — Telehealth: Payer: Self-pay | Admitting: Cardiology

## 2022-11-27 NOTE — Telephone Encounter (Signed)
Pt called with increasing edema in feet, toes, legs and some SOB requiring use of inhaler. She has not been using salt and no salty foods.  She has only been taking her lasix once per day and ordered twice per day 40 mg.  She will take 80 mg now and then 40 mg BID.  If she becomes significantly SOB she will go to ER.  She wall call [place] office Triage in AM to report how she did with the 80 of lasix tonight.     Triage if she does not reach out to you, could you please call to see if she improved.  Thank you.

## 2022-11-28 NOTE — Addendum Note (Signed)
Addended by: Eustace Moore on: 11/28/2022 09:04 AM   Modules accepted: Orders

## 2022-11-28 NOTE — Telephone Encounter (Addendum)
Reports swelling and symptoms have improved. Advised of PharmD referral for wegovy and says her PCP started her on ozempic 1 month ago and she is now on 0.5 mg weekly. Advised this is the same medication and she could stay on this in which can be titrated. Medication profile updated. Verbalized understanding.

## 2022-12-18 ENCOUNTER — Telehealth: Payer: Self-pay | Admitting: Internal Medicine

## 2022-12-18 NOTE — Telephone Encounter (Signed)
Pt c/o swelling: STAT is pt has developed SOB within 24 hours  How much weight have you gained and in what time span? Unsure but knows that she has   If swelling, where is the swelling located? In feel and legs   Are you currently taking a fluid pill? yes  Are you currently SOB? No   Do you have a log of your daily weights (if so, list)?    Have you gained 3 pounds in a day or 5 pounds in a week? Unsure   Have you traveled recently?  no

## 2022-12-18 NOTE — Telephone Encounter (Signed)
Reports SOB & swelling in legs and feet that started Saturday. Does not weigh at home due to scale not working properly. Says she will get a new scale tomorrow. Denies dizziness or chest pain. Reports no change in diet or increase in sodium or fluid intake. Reports no recent travel. Medications reviewed. Advised to take an additional 20 mg furosemide twice daily for the next 3-4 days and update office on Friday with an update. Advised if she develops worsening symptoms, to go to the ED for an evaluation. Verbalized understanding of plan.Marland Kitchen
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
## 2023-01-07 ENCOUNTER — Other Ambulatory Visit: Payer: Self-pay | Admitting: Psychiatry

## 2023-01-08 ENCOUNTER — Encounter: Payer: Self-pay | Admitting: Psychiatry

## 2023-01-08 ENCOUNTER — Ambulatory Visit (INDEPENDENT_AMBULATORY_CARE_PROVIDER_SITE_OTHER): Payer: 59 | Admitting: Psychiatry

## 2023-01-08 ENCOUNTER — Telehealth: Payer: Self-pay | Admitting: Psychiatry

## 2023-01-08 VITALS — BP 112/68 | HR 76 | Temp 98.7°F | Ht 66.0 in | Wt 234.4 lb

## 2023-01-08 DIAGNOSIS — R635 Abnormal weight gain: Secondary | ICD-10-CM | POA: Diagnosis not present

## 2023-01-08 DIAGNOSIS — G2401 Drug induced subacute dyskinesia: Secondary | ICD-10-CM

## 2023-01-08 DIAGNOSIS — F3342 Major depressive disorder, recurrent, in full remission: Secondary | ICD-10-CM | POA: Diagnosis not present

## 2023-01-08 DIAGNOSIS — T50905A Adverse effect of unspecified drugs, medicaments and biological substances, initial encounter: Secondary | ICD-10-CM | POA: Diagnosis not present

## 2023-01-08 MED ORDER — DEUTETRABENAZINE 9 MG PO TABS: 9.0000 mg | ORAL_TABLET | Freq: Every day | ORAL | 0 refills | Status: AC

## 2023-01-08 MED ORDER — METFORMIN HCL 500 MG PO TABS: 500.0000 mg | ORAL_TABLET | Freq: Every morning | ORAL | 0 refills | Status: DC

## 2023-01-08 MED ORDER — CARIPRAZINE HCL 1.5 MG PO CAPS: 1.5000 mg | ORAL_CAPSULE | Freq: Every day | ORAL | 0 refills | Status: AC

## 2023-01-08 MED ORDER — FLUOXETINE HCL 40 MG PO CAPS: 40.0000 mg | ORAL_CAPSULE | Freq: Every day | ORAL | 0 refills | Status: AC

## 2023-01-08 NOTE — Telephone Encounter (Signed)
Patient called stating she contacting [facility] and that she will be able to transfer there. Patient stated her insurance will be accepted at [facility]. Patient stated she needs a referral.-Please Advise

## 2023-01-08 NOTE — Telephone Encounter (Signed)
Could you place this referral? thanks

## 2023-01-09 NOTE — Telephone Encounter (Signed)
Referral has already been sent. done

## 2023-01-19 ENCOUNTER — Other Ambulatory Visit: Payer: Self-pay | Admitting: Nurse Practitioner

## 2023-01-29 ENCOUNTER — Other Ambulatory Visit: Payer: Self-pay | Admitting: Psychiatry

## 2023-01-29 ENCOUNTER — Other Ambulatory Visit: Payer: Self-pay | Admitting: Nurse Practitioner

## 2023-02-05 ENCOUNTER — Other Ambulatory Visit: Payer: Self-pay | Admitting: Nurse Practitioner

## 2023-03-05 ENCOUNTER — Ambulatory Visit: Payer: 59 | Admitting: Psychiatry

## 2023-03-07 ENCOUNTER — Ambulatory Visit: Payer: 59 | Admitting: Pulmonary Disease

## 2023-03-13 ENCOUNTER — Ambulatory Visit: Payer: 59 | Admitting: Pulmonary Disease

## 2023-03-23 ENCOUNTER — Encounter: Payer: Self-pay | Admitting: Pulmonary Disease

## 2023-04-02 ENCOUNTER — Other Ambulatory Visit (INDEPENDENT_AMBULATORY_CARE_PROVIDER_SITE_OTHER): Payer: 59

## 2023-04-02 ENCOUNTER — Encounter: Payer: Self-pay | Admitting: Orthopedic Surgery

## 2023-04-02 ENCOUNTER — Ambulatory Visit: Payer: 59 | Admitting: Orthopedic Surgery

## 2023-04-02 VITALS — BP 124/69 | HR 101 | Ht 64.0 in | Wt 230.0 lb

## 2023-04-02 DIAGNOSIS — M7552 Bursitis of left shoulder: Secondary | ICD-10-CM | POA: Diagnosis not present

## 2023-04-02 DIAGNOSIS — M25512 Pain in left shoulder: Secondary | ICD-10-CM

## 2023-04-02 DIAGNOSIS — G8929 Other chronic pain: Secondary | ICD-10-CM

## 2023-04-02 MED ORDER — PREDNISONE 10 MG PO TABS
10.0000 mg | ORAL_TABLET | Freq: Two times a day (BID) | ORAL | 0 refills | Status: DC
Start: 2023-04-02 — End: 2023-06-28

## 2023-04-02 MED ORDER — HYDROCODONE-ACETAMINOPHEN 10-325 MG PO TABS: 1.0000 | ORAL_TABLET | Freq: Four times a day (QID) | ORAL | 0 refills | Status: DC | PRN

## 2023-04-02 NOTE — Progress Notes (Signed)
Office Visit Note   Patient: Ashley Patrick           Date of Birth: [birth year]           MRN: [id number] Visit Date: 04/02/2023 Requested by: Marylynn Pearson, FNP [address] [place],  [place] [address] PCP: Marylynn Pearson, FNP   Assessment & Plan:   Encounter Diagnoses  Name Primary?   Acute pain of left shoulder    Bursitis of left shoulder Yes    Meds ordered this encounter  Medications   predniSONE (DELTASONE) 10 MG tablet    Sig: Take 1 tablet (10 mg total) by mouth 2 (two) times daily with a meal.    Dispense:  60 tablet    Refill:  0   HYDROcodone-acetaminophen (NORCO) 10-325 MG tablet    Sig: Take 1 tablet by mouth every 6 (six) hours as needed.    Dispense:  30 tablet    Refill:  0    Severe acute pain left shoulder x-rays ruled out for calcific tendinitis, no arthritis on shoulder x-ray no trauma to suggest cuff tear seems to have acute bursitis  Treated with Injection Opioid for pain Steroid for inflammation Sling  Return 1 week  Procedure note the subacromial injection shoulder left   Verbal consent was obtained to inject the  Left   Shoulder  Timeout was completed to confirm the injection site is a subacromial space of the  left  shoulder  Medication used Depo-Medrol 40 mg and lidocaine 1% 3 cc  Anesthesia was provided by ethyl chloride  The injection was performed in the left  posterior subacromial space. After pinning the skin with alcohol and anesthetized the skin with ethyl chloride the subacromial space was injected using a 20-gauge needle. There were no complications  Sterile dressing was applied.         Subjective: Chief Complaint  Patient presents with   Shoulder Pain    Left no xrays done of the left shoulder has had pain for 3 months getting worse     HPI: 67 year old female presented to other physicians with acute onset set of left shoulder pain with no trauma.  She had an IM injection in her left  shoulder, naproxen, Medrol Dosepak, injection left deltoid.  Still complains of severe pain.  She has pain with forward elevation she has pain sleeping on her left side              ROS: Again no trauma no prior history of shoulder issue   Images personally read and my interpretation : X-ray was normal  Visit Diagnoses:  1. Bursitis of left shoulder   2. Acute pain of left shoulder      Follow-Up Instructions: Return in about 1 week (around 04/09/2023) for FOLLOW UP, LEFT, SHOULDER.    Objective: Vital Signs: BP 124/69   Pulse (!) 101   Ht 5\' 4"  (1.626 m)   Wt 230 lb (104.3 kg)   BMI 39.48 kg/m   Physical Exam Vitals and nursing note reviewed.  Constitutional:      General: She is in acute distress.     Appearance: Normal appearance.  HENT:     Head: Normocephalic and atraumatic.  Eyes:     General: No scleral icterus.       Right eye: No discharge.        Left eye: No discharge.     Extraocular Movements: Extraocular movements intact.     Conjunctiva/sclera:  Conjunctivae normal.     Pupils: Pupils are equal, round, and reactive to light.  Cardiovascular:     Rate and Rhythm: Normal rate.     Pulses: Normal pulses.  Skin:    General: Skin is warm and dry.     Capillary Refill: Capillary refill takes less than 2 seconds.  Neurological:     General: No focal deficit present.     Mental Status: She is alert and oriented to person, place, and time.  Psychiatric:        Mood and Affect: Mood normal.        Behavior: Behavior normal.        Thought Content: Thought content normal.        Judgment: Judgment normal.     Left Shoulder Exam    Comments:  Tenderness was in the posterior and anterior aspect of the shoulder especially in the rotator interval consistent with pain running to the elbow  Pain with abduction at 80  Pain with external rotation at 40  Pain with extension at 40 as well  Active abduction and flexion were deferred because of the  pain     Specialty Comments:  No specialty comments available.  Imaging: No results found.   PMFS History: Patient Active Problem List   Diagnosis Date Noted   Coronary artery disease 06/29/2022   CHF (congestive heart failure) (HCC) 06/27/2022   Acute respiratory failure with hypoxia (HCC) 06/27/2022   DM II (diabetes mellitus, type II), controlled (HCC) 06/27/2022   HLD (hyperlipidemia) 04/18/2022   (HFpEF) heart failure with preserved ejection fraction (HCC) 04/18/2022   Mixed stress and urge urinary incontinence 06/01/2021   Continuous leakage of urine 06/01/2021   S/P hysterectomy 06/01/2021   Cystocele, midline 06/01/2021   OAB (overactive bladder) 06/01/2021   Preoperative cardiovascular examination 01/29/2017   MDD (major depressive disorder), recurrent, severe, with psychosis (HCC) 12/30/2016   Tobacco abuse counseling 09/13/2016   Gastroesophageal reflux disease 05/02/2016   Hiatal hernia    History of colonic polyps    Diverticulosis of colon without hemorrhage    History of colon cancer 07/20/2014   Dysphagia 07/20/2014   Lower abdominal pain 09/30/2013   Anxiety, generalized 08/08/2013   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 08/07/2013   Noncompliance with medications 08/07/2013   Rectal bleeding 05/09/2013   LLQ pain 11/28/2012   Hemorrhagic cyst of ovary 11/28/2012   Nausea with vomiting 11/28/2012   UNSPECIFIED URINARY INCONTINENCE 09/03/2008   ACUTE CYSTITIS 07/19/2008   OBESITY 11/22/2007   Psychosis (HCC) 11/22/2007   Essential hypertension 11/22/2007   Backache 11/22/2007   Lateral epicondylitis 11/22/2007   Past Medical History:  Diagnosis Date   Anxiety    Aortic atherosclerosis (HCC)    Arthritis    Asthma    Back pain    Bipolar disorder (HCC)    Cancer (HCC) 2014   Colon Cancer   Constipation    Depression    Dyspnea    Hypercholesteremia    Hypertension    Insomnia    Lateral epicondylitis  of elbow     right   Migraine headache    Nicotine addiction    Obesity    History of   OD (overdose of drug)    hospitalized for 11 days    Panic attacks    Psychosis (HCC)    Schizophrenia (HCC)     Family History  Problem Relation Age of Onset   Stroke Mother  Drug abuse Mother    Arthritis Mother    Arthritis Father    Colon cancer Father        diagnosed in his 29s   Drug abuse Father    Hypertension Sister    Asthma Sister    HIV/AIDS Brother    Colon cancer Paternal Uncle        66s   Anesthesia problems Neg Hx    Hypotension Neg Hx    Malignant hyperthermia Neg Hx    Pseudochol deficiency Neg Hx     Past Surgical History:  Procedure Laterality Date   ABDOMINAL HYSTERECTOMY     BIOPSY N/A 08/13/2014   Procedure: BIOPSY;  Surgeon: Corbin Ade, MD;  Location: [facility];  Service: Endoscopy;  Laterality: N/A;   BREAST BIOPSY  10/18/2011   Procedure: BREAST BIOPSY;  Surgeon: Dalia Heading, MD;  Location: [facility];  Service: General;  Laterality: Left;   COLON RESECTION N/A 06/23/2013   Procedure: HAND ASSISTED LAPAROSCOPIC PARTIAL COLECTOMY;  Surgeon: Dalia Heading, MD;  Location: [facility];  Service: General;  Laterality: N/A;   COLONOSCOPY     COLONOSCOPY N/A 05/29/2013   QMV:HQIONGE mass most likely representing colorectal cancer S/ P biospy.Multiple colonic and rectal polyps removed/treated as described above. Colonic diverticulosis   COLONOSCOPY WITH PROPOFOL N/A 08/13/2014   RMR: Status post sigmoid colectomy. Multiple colonic polyps removed as described above. Redundant colon. Pan colonic diverticuloisi   COLONOSCOPY WITH PROPOFOL N/A 05/22/2016   Surgeon: Corbin Ade, MD; 2 polyps removed, but did not survive pathology processing.  Surgical anastomosis in the sigmoid colon noted, appeared patent, question somewhat narrowed just proximal anastomosis.  Repeat in 3 years.   COLONOSCOPY WITH PROPOFOL N/A 08/05/2021   Procedure: COLONOSCOPY WITH PROPOFOL;  Surgeon: Lanelle Bal, DO;  Location: [facility];  Service: Endoscopy;  Laterality: N/A;  10:00am   CORONARY STENT INTERVENTION N/A 06/29/2022   Procedure: CORONARY STENT INTERVENTION;  Surgeon: Corky Crafts, MD;  Location: [facility] INVASIVE CV LAB;  Service: Cardiovascular;  Laterality: N/A;   CORONARY ULTRASOUND/IVUS N/A 06/29/2022   Procedure: Intravascular Ultrasound/IVUS;  Surgeon: Corky Crafts, MD;  Location: [facility] INVAS[facility] CV LAB;  Service: Cardiovascular;  Laterality: N/A;   ESOPHAGEAL DILATION N/A 08/13/2014   Procedure: ESOPHAGEAL MALONEY DILATION 54 FRENCH;  Surgeon: Corbin Ade, MD;  Location: [facility];  Service: Endoscopy;  Laterality: N/A;   ESOPHAGOGASTRODUODENOSCOPY N/A 12/26/2012   XBM:WUXLKGM reflux esophagitis. Gastric and duodenal bulbar erosions-status post gastric biopsynegative H.pylori   ESOPHAGOGASTRODUODENOSCOPY (EGD) WITH PROPOFOL N/A 08/13/2014   RMR: Small benign cystic-appearing lesion in distal esophagus of doubtful clinical significance, otherwise normal esophagus, status post Maloney dilation. Small hiatal hernia, some retained gastric contents (query delayed gastric emptying.)   partial hysterectomy     POLYPECTOMY N/A 08/13/2014   Procedure: POLYPECTOMY;  Surgeon: Corbin Ade, MD;  Location: [facility];  Service: Endoscopy;  Laterality: N/A;   POLYPECTOMY  08/05/2021   Procedure: POLYPECTOMY;  Surgeon: Lanelle Bal, DO;  Location: [facility];  Service: Endoscopy;;   RIGHT/LEFT HEART CATH AND CORONARY ANGIOGRAPHY N/A 06/29/2022   Procedure: RIGHT/LEFT HEART CATH AND CORONARY ANGIOGRAPHY;  Surgeon: Corky Crafts, MD;  Location: [facility] INVASIVE CV LAB;  Service: Cardiovascular;  Laterality: N/A;   Social History   Occupational History   Not on file  Tobacco Use   Smoking status: Former    Current packs/day: 0.00    Average packs/day: 0.5 packs/day for  38.0 years (19.0 ttl pk-yrs)    Types: Cigarettes    Start date: 06/19/1984    Quit date: 06/19/2022     Years since quitting: 0.7   Smokeless tobacco: Never   Tobacco comments:    States she has not smoked this week and is attempting to quit.   Vaping Use   Vaping status: Never Used  Substance and Sexual Activity   Alcohol use: No    Alcohol/week: 0.0 standard drinks of alcohol   Drug use: No   Sexual activity: Not Currently    Birth control/protection: Surgical    Comment: hyst

## 2023-04-05 MED ORDER — METHYLPREDNISOLONE ACETATE 40 MG/ML IJ SUSP
40.0000 mg | Freq: Once | INTRAMUSCULAR | Status: AC
Start: 2023-04-05 — End: 2023-04-05
  Administered 2023-04-05: 40 mg via INTRA_ARTICULAR

## 2023-04-05 NOTE — Addendum Note (Signed)
Addended byCaffie Damme on: 04/05/2023 08:43 AM   Modules accepted: Orders

## 2023-04-07 ENCOUNTER — Other Ambulatory Visit: Payer: Self-pay | Admitting: Psychiatry

## 2023-04-09 ENCOUNTER — Encounter: Payer: Self-pay | Admitting: Orthopedic Surgery

## 2023-04-09 ENCOUNTER — Ambulatory Visit (INDEPENDENT_AMBULATORY_CARE_PROVIDER_SITE_OTHER): Payer: 59 | Admitting: Orthopedic Surgery

## 2023-04-09 DIAGNOSIS — M25512 Pain in left shoulder: Secondary | ICD-10-CM

## 2023-04-09 DIAGNOSIS — M7552 Bursitis of left shoulder: Secondary | ICD-10-CM

## 2023-04-09 NOTE — Progress Notes (Signed)
   VISIT TYPE: FOLLOW UP   No chief complaint on file.   Encounter Diagnoses  Name Primary?   Bursitis of left shoulder Yes   Acute pain of left shoulder     Assessment and Plan: Significant improvements left shoulder, patient exhibits return to normal range of motion Return as needed.   HPI:   Today: She is doing much better her range of motion is improved she still has some soreness in the shoulder  04/02/23 Severe acute pain left shoulder x-rays ruled out for calcific tendinitis, no arthritis on shoulder x-ray no trauma to suggest cuff tear seems to have acute bursitis   Treated with Injection Opioid for pain Steroid for inflammation Sling  There were no vitals taken for this visit.  Ortho Exam Shoulder abduction and flexion with mild discomfort, range of motion returned to normal  Imaging no new imaging  A/P Encounter Diagnoses  Name Primary?   Bursitis of left shoulder Yes   Acute pain of left shoulder     No orders of the defined types were placed in this encounter.

## 2023-04-16 ENCOUNTER — Other Ambulatory Visit: Payer: Self-pay | Admitting: Internal Medicine

## 2023-04-18 ENCOUNTER — Other Ambulatory Visit: Payer: Self-pay | Admitting: Psychiatry

## 2023-04-23 ENCOUNTER — Other Ambulatory Visit: Payer: Self-pay | Admitting: Psychiatry

## 2023-04-26 ENCOUNTER — Ambulatory Visit: Payer: 59 | Attending: Internal Medicine | Admitting: Internal Medicine

## 2023-04-26 NOTE — Progress Notes (Signed)
Erroneous encounter - please disregard.

## 2023-06-14 ENCOUNTER — Encounter: Payer: Self-pay | Admitting: Internal Medicine

## 2023-06-28 ENCOUNTER — Other Ambulatory Visit: Payer: Self-pay | Admitting: Orthopedic Surgery

## 2023-06-28 DIAGNOSIS — M7552 Bursitis of left shoulder: Secondary | ICD-10-CM

## 2023-06-28 DIAGNOSIS — M25512 Pain in left shoulder: Secondary | ICD-10-CM

## 2023-07-09 ENCOUNTER — Other Ambulatory Visit: Payer: Self-pay | Admitting: Internal Medicine

## 2023-07-10 ENCOUNTER — Other Ambulatory Visit (HOSPITAL_COMMUNITY): Payer: Self-pay | Admitting: Family Medicine

## 2023-07-10 DIAGNOSIS — Z1231 Encounter for screening mammogram for malignant neoplasm of breast: Secondary | ICD-10-CM

## 2023-07-13 ENCOUNTER — Encounter (INDEPENDENT_AMBULATORY_CARE_PROVIDER_SITE_OTHER): Payer: Self-pay | Admitting: *Deleted

## 2023-07-18 ENCOUNTER — Ambulatory Visit (HOSPITAL_COMMUNITY): Payer: 59

## 2023-09-13 ENCOUNTER — Other Ambulatory Visit (INDEPENDENT_AMBULATORY_CARE_PROVIDER_SITE_OTHER): Payer: Self-pay

## 2023-09-13 ENCOUNTER — Ambulatory Visit: Admitting: Orthopedic Surgery

## 2023-09-13 VITALS — BP 119/77 | HR 98

## 2023-09-13 DIAGNOSIS — M65941 Unspecified synovitis and tenosynovitis, right hand: Secondary | ICD-10-CM | POA: Diagnosis not present

## 2023-09-13 DIAGNOSIS — M7552 Bursitis of left shoulder: Secondary | ICD-10-CM

## 2023-09-13 DIAGNOSIS — M79641 Pain in right hand: Secondary | ICD-10-CM

## 2023-09-13 DIAGNOSIS — M25512 Pain in left shoulder: Secondary | ICD-10-CM

## 2023-09-13 MED ORDER — METHYLPREDNISOLONE ACETATE 40 MG/ML IJ SUSP
40.0000 mg | Freq: Once | INTRAMUSCULAR | Status: AC
  Administered 2023-09-13: 40 mg via INTRA_ARTICULAR

## 2023-09-13 MED ORDER — PREDNISONE 10 MG PO TABS: 10.0000 mg | ORAL_TABLET | Freq: Two times a day (BID) | ORAL | 0 refills | Status: DC

## 2023-09-13 NOTE — Progress Notes (Signed)
 Chief Complaint  Patient presents with   Shoulder Pain    L for 2 wks   Hand Pain    Whole hand but locking in small finger for 1-2 wks.    Shoulder Pain   Hand Pain    68 year old female with recurrent pain left shoulder history of bursitis.  Patient was on prednisone twice a day she said about a month ago the medicine ran down the shoulder pain started to come back.  Pain over the anterolateral and lateral deltoid associated with forward elevation without weakness  Patient planes of pain in the right hand specifically over the metacarpal phalangeal joint of the fifth digit.  There is tenderness over the A1 pulley but also on the dorsum of the hand  We did not reproduce any clicking or catching  X-ray was obtained  No fracture or dislocation or bone mass was seen  Encounter Diagnoses  Name Primary?   Pain in right hand    Acute pain of left shoulder    Bursitis of left shoulder    Synovitis of right hand Yes    Recommend resume prednisone and inject left shoulder  Procedure note the subacromial injection shoulder left   Verbal consent was obtained to inject the  Left   Shoulder  Timeout was completed to confirm the injection site is a subacromial space of the  left  shoulder  Medication used Depo-Medrol 40 mg and lidocaine 1% 3 cc  Anesthesia was provided by ethyl chloride  The injection was performed in the left  posterior subacromial space. After pinning the skin with alcohol and anesthetized the skin with ethyl chloride the subacromial space was injected using a 20-gauge needle. There were no complications  Sterile dressing was applied.          Procedure note the subacromial injection shoulder left   Verbal consent was obtained to inject the  Left   Shoulder  Timeout was completed to confirm the injection site is a subacromial space of the  left  shoulder  Medication used Depo-Medrol 40 mg and lidocaine 1% 3 cc  Anesthesia was provided by ethyl  chloride  The injection was performed in the left  posterior subacromial space. After pinning the skin with alcohol and anesthetized the skin with ethyl chloride the subacromial space was injected using a 20-gauge needle. There were no complications  Sterile dressing was applied.  Return prn

## 2023-10-11 ENCOUNTER — Other Ambulatory Visit: Payer: Self-pay | Admitting: Internal Medicine

## 2023-10-16 ENCOUNTER — Other Ambulatory Visit: Payer: Self-pay | Admitting: Internal Medicine

## 2023-10-22 ENCOUNTER — Other Ambulatory Visit: Payer: Self-pay

## 2023-11-15 ENCOUNTER — Other Ambulatory Visit: Payer: Self-pay | Admitting: Internal Medicine

## 2023-11-25 ENCOUNTER — Other Ambulatory Visit: Payer: Self-pay | Admitting: Internal Medicine

## 2023-12-29 ENCOUNTER — Other Ambulatory Visit: Payer: Self-pay | Admitting: Internal Medicine

## 2024-01-02 ENCOUNTER — Other Ambulatory Visit: Payer: Self-pay | Admitting: Internal Medicine

## 2024-01-10 ENCOUNTER — Other Ambulatory Visit (INDEPENDENT_AMBULATORY_CARE_PROVIDER_SITE_OTHER): Payer: Self-pay

## 2024-01-10 ENCOUNTER — Encounter: Payer: Self-pay | Admitting: Orthopedic Surgery

## 2024-01-10 ENCOUNTER — Ambulatory Visit: Admitting: Orthopedic Surgery

## 2024-01-10 VITALS — BP 124/84 | HR 89 | Ht 64.0 in | Wt 208.0 lb

## 2024-01-10 DIAGNOSIS — M545 Low back pain, unspecified: Secondary | ICD-10-CM

## 2024-01-10 DIAGNOSIS — M25512 Pain in left shoulder: Secondary | ICD-10-CM | POA: Diagnosis not present

## 2024-01-10 DIAGNOSIS — M47816 Spondylosis without myelopathy or radiculopathy, lumbar region: Secondary | ICD-10-CM

## 2024-01-10 DIAGNOSIS — M1611 Unilateral primary osteoarthritis, right hip: Secondary | ICD-10-CM | POA: Diagnosis not present

## 2024-01-10 DIAGNOSIS — M7552 Bursitis of left shoulder: Secondary | ICD-10-CM

## 2024-01-10 DIAGNOSIS — G8929 Other chronic pain: Secondary | ICD-10-CM

## 2024-01-10 MED ORDER — HYDROCODONE-ACETAMINOPHEN 10-325 MG PO TABS: 1.0000 | ORAL_TABLET | Freq: Four times a day (QID) | ORAL | 0 refills | Status: AC | PRN

## 2024-01-10 MED ORDER — PREDNISONE 10 MG PO TABS: 10.0000 mg | ORAL_TABLET | Freq: Two times a day (BID) | ORAL | 0 refills | Status: AC

## 2024-01-10 NOTE — Progress Notes (Signed)
  Intake history:  BP 124/84   Pulse 89   Ht 5' 4 (1.626 m)   Wt 208 lb (94.3 kg)   BMI 35.70 kg/m  Body mass index is 35.7 kg/m.    WHAT ARE WE SEEING YOU FOR TODAY?   left shoulder, bilateral hip(s)  How long has this bothered you? (DOI?DOS?WS?)  2-3 month(s) ago  Anticoag.  Yes  Diabetes No  Heart disease Yes  Hypertension Yes  SMOKING HX Yes  Kidney disease No  Any ALLERGIES ______________________________________________   Treatment:  Have you taken:  Tylenol  Yes  Advil  No  Had PT No  Had injection No  Other Norco from [facility] ER visit 7/7____________________

## 2024-01-10 NOTE — Progress Notes (Signed)
 Chief Complaint  Patient presents with   Hip Pain   Shoulder Pain     Right hip pain;  lower back pain  Recurrent left shoulder pain  68 year old female presented to [facility] emergency department early July with complaints of hip pain  She was diagnosed with bursitis  Symptoms include pain across her lower back groin and anterior thigh at  She has chronic pain in her left shoulder x-rays of been done before no arthritic changes were seen this was presumed to be bursitis she comes in again with painful left shoulder decreased range of motion  She is on Plavix  and therefore was not put on an anti-inflammatory she also has GERD and dysphagia  Problem list, medical hx, medications and allergies reviewed    Images were not available for review so I had to repeat the pelvis and ordered new findings of the spine  DG Lumbar Spine 2-3 Views Result Date: 01/10/2024 Spine imaging back pain Plumbline shows abnormal alignment in the coronal plane the sagittal plane shows mild flattening of the lumbar curve facet joint degenerative changes throughout the last 4 segments of the lumbar spine..  There is no evidence of fracture.  The disc spaces are well-maintained Impression spondylosis primarily in the facet joints at mild focal shift which may or may not be reactive   DG Pelvis 1-2 Views Result Date: 01/10/2024 Right groin pain X-rays were done at another facility and could not be retrieved No history of trauma Superolateral abnormalities of the acetabulum and inferior abnormalities which look to be degenerative in nature.  Similar findings are noted as well on the left side Impression mild arthritis both hips    Positive physical findings regarding the right hip pain and full range of motion of the hip in the groin  Spine shows tenderness in the lower back into the right and left sides of the lower back  She is ambulating with a cane and she limps favoring the right side   Encounter  Diagnoses  Name Primary?   Lumbar pain Yes   Chronic left shoulder pain    Arthritis of right hip    Facet arthritis of lumbar region     Plan  The patient has not a candidate for any surgical intervention in the hip arthritis is not warranted.  Although I do not treat chronic back pain I can certainly institute physical therapy and start medication.  If this becomes something that requires longer term current opioid use then she can be send she is on chronic pain management and orthopedic spine care can consulted  As for her shoulder she needs an mri to r/o rotator cuff tear   The patient did turn out to be on Plavix  she was not taking it we told her to get her medication refilled and emphasized that her stent could clot  She can take the following medications and go to physical therapy  Follow-up after MRI of the shoulder  Meds ordered this encounter  Medications   HYDROcodone -acetaminophen  (NORCO) 10-325 MG tablet    Sig: Take 1 tablet by mouth every 6 (six) hours as needed.    Dispense:  30 tablet    Refill:  0   predniSONE  (DELTASONE ) 10 MG tablet    Sig: Take 1 tablet (10 mg total) by mouth 2 (two) times daily with a meal.    Dispense:  60 tablet    Refill:  0

## 2024-01-11 ENCOUNTER — Other Ambulatory Visit: Payer: Self-pay | Admitting: Nurse Practitioner

## 2024-01-16 ENCOUNTER — Ambulatory Visit (HOSPITAL_COMMUNITY)
Admission: RE | Admit: 2024-01-16 | Discharge: 2024-01-16 | Disposition: A | Discharge status: Home / Self Care | Source: Ambulatory Visit | Attending: Orthopedic Surgery | Admitting: Orthopedic Surgery

## 2024-01-16 DIAGNOSIS — G8929 Other chronic pain: Secondary | ICD-10-CM | POA: Insufficient documentation

## 2024-01-16 DIAGNOSIS — M25512 Pain in left shoulder: Secondary | ICD-10-CM | POA: Insufficient documentation

## 2024-01-17 ENCOUNTER — Other Ambulatory Visit: Payer: Self-pay | Admitting: Internal Medicine

## 2024-01-21 ENCOUNTER — Encounter: Payer: Self-pay | Admitting: Internal Medicine

## 2024-01-21 ENCOUNTER — Ambulatory Visit: Attending: Internal Medicine | Admitting: Internal Medicine

## 2024-01-21 VITALS — BP 132/86 | HR 88 | Ht 66.0 in | Wt 210.0 lb

## 2024-01-21 DIAGNOSIS — I2510 Atherosclerotic heart disease of native coronary artery without angina pectoris: Secondary | ICD-10-CM

## 2024-01-21 DIAGNOSIS — I5032 Chronic diastolic (congestive) heart failure: Secondary | ICD-10-CM | POA: Diagnosis not present

## 2024-01-21 DIAGNOSIS — Z720 Tobacco use: Secondary | ICD-10-CM | POA: Diagnosis not present

## 2024-01-21 DIAGNOSIS — I10 Essential (primary) hypertension: Secondary | ICD-10-CM | POA: Diagnosis not present

## 2024-01-21 NOTE — Patient Instructions (Addendum)
 Medication Instructions:  Your physician has recommended you make the following change in your medication:  Stop taking Plavix  and Olmesartan   Continue taking all other medications as prescribed   Labwork: None   Testing/Procedures: None  Follow-Up: Your physician recommends that you schedule a follow-up appointment in: 1 year. You will receive a reminder call in about 8 months reminding you to schedule your appointment. If you don't receive this call, please contact our office.   Any Other Special Instructions Will Be Listed Below (If Applicable). Thank you for choosing [facility]!     If you need a refill on your cardiac medications before your next appointment, please call your pharmacy.

## 2024-01-21 NOTE — Progress Notes (Signed)
 Cardiology Office Note  Date: 01/21/2024   ID: Ashley Patrick, DOB Apr 26, 1956, MRN [id number]  PCP:  Vick Lurie, FNP  Cardiologist:  Diannah SHAUNNA Maywood, MD Electrophysiologist:  None   Reason for Office Visit: Follow-up of CAD   History of Present Illness: Ashley Patrick is a 69 y.o. female known to have stable ischemic heart disease manifested by diastolic heart failure exacerbation in 06/2022 s/p LCx PCI with normal LVEF, HTN, DM 2, chronic diastolic heart failure, HLD, major depression is here for follow-up visit.  No interval ER visits or hospitalizations.  She reports having chest pain twice a month but occurs at rest and not with exertion.  She also reports having DOE but not as bad as before her PCI was placed.  No dizziness, syncope, palpitations or leg swelling.  Compliant with medications.  She is taking both benazepril  and olmesartan .  Previously quit smoking after PCI but restarted back, currently smoking 8 cigarettes/day.  Past Medical History:  Diagnosis Date   Anxiety    Aortic atherosclerosis (HCC)    Arthritis    Asthma    Back pain    Bipolar disorder (HCC)    Cancer (HCC) 2014   Colon Cancer   Constipation    Depression    Dyspnea    Hypercholesteremia    Hypertension    Insomnia    Lateral epicondylitis  of elbow    right   Migraine headache    Nicotine  addiction    Obesity    History of   OD (overdose of drug)    hospitalized for 11 days    Panic attacks    Psychosis (HCC)    Schizophrenia (HCC)     Past Surgical History:  Procedure Laterality Date   ABDOMINAL HYSTERECTOMY     BIOPSY N/A 08/13/2014   Procedure: BIOPSY;  Surgeon: Lamar CHRISTELLA Hollingshead, MD;  Location: [facility];  Service: Endoscopy;  Laterality: N/A;   BREAST BIOPSY  10/18/2011   Procedure: BREAST BIOPSY;  Surgeon: Oneil DELENA Budge, MD;  Location: [facility];  Service: General;  Laterality: Left;   COLON RESECTION N/A 06/23/2013   Procedure: HAND ASSISTED LAPAROSCOPIC PARTIAL  COLECTOMY;  Surgeon: Oneil DELENA Budge, MD;  Location: [facility];  Service: General;  Laterality: N/A;   COLONOSCOPY     COLONOSCOPY N/A 05/29/2013   MFM:Dphfnpi mass most likely representing colorectal cancer S/ P biospy.Multiple colonic and rectal polyps removed/treated as described above. Colonic diverticulosis   COLONOSCOPY WITH PROPOFOL  N/A 08/13/2014   RMR: Status post sigmoid colectomy. Multiple colonic polyps removed as described above. Redundant colon. Pan colonic diverticuloisi   COLONOSCOPY WITH PROPOFOL  N/A 05/22/2016   Surgeon: Lamar CHRISTELLA Hollingshead, MD; 2 polyps removed, but did not survive pathology processing.  Surgical anastomosis in the sigmoid colon noted, appeared patent, question somewhat narrowed just proximal anastomosis.  Repeat in 3 years.   COLONOSCOPY WITH PROPOFOL  N/A 08/05/2021   Procedure: COLONOSCOPY WITH PROPOFOL ;  Surgeon: Cindie Carlin POUR, DO;  [facility];  Service: Endoscopy;  Laterality: N/A;  10:00am   CORONARY STENT INTERVENTION N/A 06/29/2022   Procedure: CORONARY STENT INTERVENTION;  Surgeon: Dann Candyce RAMAN, MD;  Location: [facility] INVASIVE CV LAB;  Service: Cardiovascular;  Laterality: N/A;   CORONARY ULTRASOUND/IVUS N/A 06/29/2022   Procedure: Intravascular Ultrasound/IVUS;  Surgeon: Dann Candyce RAMAN, MD;  Location: [facility] INVASIVE CV LAB;  Service: Cardiovascular;  Laterality: N/A;   ESOPHAGEAL DILATION N/A 08/13/2014   Procedure: ESOPHAGEAL MALONEY DILATION 54 FRENCH;  Surgeon: Lamar CHRISTELLA Hollingshead, MD;  Location: [facility];  Service: Endoscopy;  Laterality: N/A;   ESOPHAGOGASTRODUODENOSCOPY N/A 12/26/2012   MFM:Zmndpcz reflux esophagitis. Gastric and duodenal bulbar erosions-status post gastric biopsynegative H.pylori   ESOPHAGOGASTRODUODENOSCOPY (EGD) WITH PROPOFOL  N/A 08/13/2014   RMR: Small benign cystic-appearing lesion in distal esophagus of doubtful clinical significance, otherwise normal esophagus, status post Maloney dilation. Small hiatal hernia, some  retained gastric contents (query delayed gastric emptying.)   partial hysterectomy     POLYPECTOMY N/A 08/13/2014   Procedure: POLYPECTOMY;  Surgeon: Lamar CHRISTELLA Hollingshead, MD;  [facility]: [facility];  Service: Endoscopy;  Laterality: N/A;   POLYPECTOMY  08/05/2021   Procedure: POLYPECTOMY;  Surgeon: Cindie Carlin POUR, DO;  [facility];  Service: Endoscopy;;   RIGHT/LEFT HEART CATH AND CORONARY ANGIOGRAPHY N/A 06/29/2022   Procedure: RIGHT/LEFT HEART CATH AND CORONARY ANGIOGRAPHY;  Surgeon: Dann Candyce RAMAN, MD;  Location: [facility] INVASIVE CV LAB;  Service: Cardiovascular;  Laterality: N/A;    Current Outpatient Medications  Medication Sig Dispense Refill   acetaminophen  (TYLENOL ) 650 MG CR tablet Take 650 mg by mouth every 8 (eight) hours as needed for pain.     albuterol  (VENTOLIN  HFA) 108 (90 Base) MCG/ACT inhaler Inhale 2 puffs into the lungs every 4 (four) hours as needed for wheezing or shortness of breath.     amLODipine -benazepril  (LOTREL) 5-10 MG capsule Take 1 capsule by mouth daily.     aspirin  EC 81 MG tablet Take 81 mg by mouth daily. Swallow whole.     clopidogrel  (PLAVIX ) 75 MG tablet Take 1 tablet by mouth once daily with breakfast 90 tablet 3   FLUoxetine  (PROZAC ) 40 MG capsule Take 1 capsule (40 mg total) by mouth daily. 90 capsule 0   furosemide  (LASIX ) 40 MG tablet Take 1 tablet (40 mg total) by mouth 2 (two) times daily. 180 tablet 0   HYDROcodone -acetaminophen  (NORCO) 10-325 MG tablet Take 1 tablet by mouth every 6 (six) hours as needed. 30 tablet 0   ipratropium-albuterol  (DUONEB) 0.5-2.5 (3) MG/3ML SOLN Inhale into the lungs as needed.     metoprolol  succinate (TOPROL -XL) 50 MG 24 hr tablet Take 1 tablet by mouth once daily 90 tablet 3   Multiple Vitamins-Minerals (MULTIVITAMIN WITH MINERALS) tablet Take 1 tablet by mouth daily.     olmesartan  (BENICAR ) 5 MG tablet Take 1 tablet (5 mg total) by mouth daily. 30 tablet 0   OZEMPIC, 1 MG/DOSE, 4 MG/3ML SOPN once a week.      pantoprazole  (PROTONIX ) 40 MG tablet Take 1 tablet by mouth once daily 90 tablet 3   potassium chloride  SA (KLOR-CON  M) 20 MEQ tablet Take 1 tablet (20 mEq total) by mouth daily. 30 tablet 3   predniSONE  (DELTASONE ) 10 MG tablet Take 1 tablet (10 mg total) by mouth 2 (two) times daily with a meal. 60 tablet 0   rosuvastatin  (CRESTOR ) 40 MG tablet TAKE 1 TABLET BY MOUTH ONCE DAILY . APPOINTMENT REQUIRED FOR FUTURE REFILLS 15 tablet 0   thiothixene (NAVANE) 2 MG capsule Take 2 mg by mouth daily.     trazodone  (DESYREL ) 300 MG tablet Take 300 mg by mouth at bedtime.     vitamin B-12 (CYANOCOBALAMIN ) 1000 MCG tablet Take 1,000 mcg by mouth daily.     No current facility-administered medications for this visit.   Allergies:  Patient has no known allergies.   Social History: The patient  reports that she has been smoking cigarettes. She started smoking about 39 years  ago. She has a 19 pack-year smoking history. She has never used smokeless tobacco. She reports that she does not drink alcohol and does not use drugs.   Family History: The patient's family history includes Arthritis in her father and mother; Asthma in her sister; Colon cancer in her father and paternal uncle; Drug abuse in her father and mother; HIV/AIDS in her brother; Hypertension in her sister; Stroke in her mother.   ROS:  Please see the history of present illness. Otherwise, complete review of systems is positive for none  All other systems are reviewed and negative.   Physical Exam: VS:  BP 132/86   Pulse 88   Ht 5' 6 (1.676 m)   Wt 210 lb (95.3 kg)   SpO2 95%   BMI 33.89 kg/m , BMI Body mass index is 33.89 kg/m.  Wt Readings from Last 3 Encounters:  01/21/24 210 lb (95.3 kg)  01/10/24 208 lb (94.3 kg)  04/02/23 230 lb (104.3 kg)    General: Patient appears comfortable at rest. HEENT: Conjunctiva and lids normal, oropharynx clear with moist mucosa. Neck: Supple, no elevated JVP or carotid bruits, no  thyromegaly. Lungs: Clear to auscultation, nonlabored breathing at rest. Cardiac: Regular rate and rhythm, no S3 or significant systolic murmur, no pericardial rub. Abdomen: Soft, nontender, no hepatomegaly, bowel sounds present, no guarding or rebound. Extremities: No pitting edema, distal pulses 2+. Skin: Warm and dry. Musculoskeletal: No kyphosis. Neuropsychiatric: Alert and oriented x3, affect grossly appropriate.  Recent Labwork: No results found for requested labs within last 365 days.     Component Value Date/Time   CHOL 138 07/25/2022 1408   TRIG 120 07/25/2022 1408   HDL 52 07/25/2022 1408   CHOLHDL 2.7 07/25/2022 1408   CHOLHDL 2.9 06/28/2022 0456   VLDL 23 06/28/2022 0456   LDLCALC 65 07/25/2022 1408    Other Studies Reviewed Today: RHC/LHC on 1/24 Mild pulmonary hypertension Single-vessel CAD s/p LCx PCI  Echocardiogram in 1/24 Normal LVEF No valve abnormalities  Assessment and Plan:  # Stable ischemic heart disease manifested by diastolic heart failure exacerbation in 06/2022 s/p LCx PCI with normal LVEF, currently angina free - Currently on DAPT, stop Plavix  as she has been taking DAPT for more than 1 year.  Continue aspirin  81 mg once daily, indefinitely. - Continue rosuvastatin  40 mg nightly. - SL NTG 0.4 mg as needed for chest pain - ER precautions for chest pain.  # HLD, at goal: Continue rosuvastatin  40 mg nightly.  Goal LDL less than 70.  LDL 65.  # Chronic HFpEF: Continue p.o. Lasix  40 mg twice daily.  # HTN, controlled: Continue current antihypertensives, amlodipine -benazepril  5-10 mg once daily, metoprolol  succinate 50 mg once daily.  Currently on olmesartan .  Stop olmesartan .  Check blood pressures at home and she can follow-up with PCP.  # Nicotine  abuse: Quit smoking completely in 06/2022 after PCI but restarted back.  Currently smoking 8 cigarettes/day.  Counseling provided.   I have spent a total of 30 minutes with patient reviewing chart,  EKGs, labs and examining patient as well as establishing an assessment and plan that was discussed with the patient.  > 50% of time was spent in direct patient care.    Medication Adjustments/Labs and Tests Ordered: Current medicines are reviewed at length with the patient today.  Concerns regarding medicines are outlined above.   Tests Ordered: Orders Placed This Encounter  Procedures   EKG 12-Lead    Medication Changes: No orders of the  defined types were placed in this encounter.   Disposition:  Follow up 1 year  Signed Selma Mink Arleta Maywood, MD, 01/21/2024 9:44 AM    [facility] at [facility] [address] [place], [place], [place] [address]

## 2024-01-26 ENCOUNTER — Other Ambulatory Visit: Payer: Self-pay | Admitting: Internal Medicine

## 2024-01-26 ENCOUNTER — Other Ambulatory Visit: Payer: Self-pay | Admitting: Nurse Practitioner

## 2024-01-30 ENCOUNTER — Telehealth: Payer: Self-pay | Admitting: Neurology

## 2024-01-30 NOTE — Telephone Encounter (Signed)
 Patient returned phone call. Informed patient was to confirm appt for tomorrow.

## 2024-01-31 ENCOUNTER — Ambulatory Visit: Admitting: Neurology

## 2024-01-31 ENCOUNTER — Encounter: Payer: Self-pay | Admitting: Neurology

## 2024-02-07 ENCOUNTER — Other Ambulatory Visit: Payer: Self-pay | Admitting: Nurse Practitioner

## 2024-02-11 MED ORDER — PANTOPRAZOLE SODIUM 40 MG PO TBEC: 40.0000 mg | DELAYED_RELEASE_TABLET | Freq: Every day | ORAL | 3 refills | Status: AC

## 2024-02-16 NOTE — Therapy (Incomplete)
 OUTPATIENT PHYSICAL THERAPY THORACOLUMBAR EVALUATION   Patient Name: Ashley Patrick MRN: [id number] DOB:19-Feb-1956, 68 y.o., female Today's Date: 02/16/2024  END OF SESSION:   Past Medical History:  Diagnosis Date   Anxiety    Aortic atherosclerosis (HCC)    Arthritis    Asthma    Back pain    Bipolar disorder (HCC)    Cancer (HCC) 2014   Colon Cancer   Constipation    Depression    Dyspnea    Hypercholesteremia    Hypertension    Insomnia    Lateral epicondylitis  of elbow    right   Migraine headache    Nicotine  addiction    Obesity    History of   OD (overdose of drug)    hospitalized for 11 days    Panic attacks    Psychosis (HCC)    Schizophrenia (HCC)    Past Surgical History:  Procedure Laterality Date   ABDOMINAL HYSTERECTOMY     BIOPSY N/A 08/13/2014   Procedure: BIOPSY;  Surgeon: Lamar CHRISTELLA Hollingshead, MD;  Location: [facility];  Service: Endoscopy;  Laterality: N/A;   BREAST BIOPSY  10/18/2011   Procedure: BREAST BIOPSY;  Surgeon: Oneil DELENA Budge, MD;  Location: [facility];  Service: General;  Laterality: Left;   COLON RESECTION N/A 06/23/2013   Procedure: HAND ASSISTED LAPAROSCOPIC PARTIAL COLECTOMY;  Surgeon: Oneil DELENA Budge, MD;  Location: [facility];  Service: General;  Laterality: N/A;   COLONOSCOPY     COLONOSCOPY N/A 05/29/2013   MFM:Dphfnpi mass most likely representing colorectal cancer S/ P biospy.Multiple colonic and rectal polyps removed/treated as described above. Colonic diverticulosis   COLONOSCOPY WITH PROPOFOL  N/A 08/13/2014   RMR: Status post sigmoid colectomy. Multiple colonic polyps removed as described above. Redundant colon. Pan colonic diverticuloisi   COLONOSCOPY WITH PROPOFOL  N/A 05/22/2016   Surgeon: Lamar CHRISTELLA Hollingshead, MD; 2 polyps removed, but did not survive pathology processing.  Surgical anastomosis in the sigmoid colon noted, appeared patent, question somewhat narrowed just proximal anastomosis.  Repeat in 3 years.   COLONOSCOPY WITH PROPOFOL   N/A 08/05/2021   Procedure: COLONOSCOPY WITH PROPOFOL ;  Surgeon: Cindie Carlin POUR, DO;  [facility];  Service: Endoscopy;  Laterality: N/A;  10:00am   CORONARY STENT INTERVENTION N/A 06/29/2022   Procedure: CORONARY STENT INTERVENTION;  Surgeon: Dann Candyce RAMAN, MD;  Location: [facility] INVASIVE CV LAB;  Service: Cardiovascular;  Laterality: N/A;   CORONARY ULTRASOUND/IVUS N/A 06/29/2022   Procedure: Intravascular Ultrasound/IVUS;  Surgeon: Dann Candyce RAMAN, MD;  Location: [facility] INVASIVE CV LAB;  Service: Cardiovascular;  Laterality: N/A;   ESOPHAGEAL DILATION N/A 08/13/2014   Procedure: ESOPHAGEAL MALONEY DILATION 54 FRENCH;  Surgeon: Lamar CHRISTELLA Hollingshead, MD;  Location: [facility];  Service: Endoscopy;  Laterality: N/A;   ESOPHAGOGASTRODUODENOSCOPY N/A 12/26/2012   MFM:Zmndpcz reflux esophagitis. Gastric and duodenal bulbar erosions-status post gastric biopsynegative H.pylori   ESOPHAGOGASTRODUODENOSCOPY (EGD) WITH PROPOFOL  N/A 08/13/2014   RMR: Small benign cystic-appearing lesion in distal esophagus of doubtful clinical significance, otherwise normal esophagus, status post Maloney dilation. Small hiatal hernia, some retained gastric contents (query delayed gastric emptying.)   partial hysterectomy     POLYPECTOMY N/A 08/13/2014   Procedure: POLYPECTOMY;  Surgeon: Lamar CHRISTELLA Hollingshead, MD;  Location: [facility];  Service: Endoscopy;  Laterality: N/A;   POLYPECTOMY  08/05/2021   Procedure: POLYPECTOMY;  Surgeon: Cindie Carlin POUR, DO;  [facility];  Service: Endoscopy;;   RIGHT/LEFT HEART CATH AND CORONARY ANGIOGRAPHY N/A 06/29/2022  Procedure: RIGHT/LEFT HEART CATH AND CORONARY ANGIOGRAPHY;  Surgeon: Dann Candyce RAMAN, MD;  Location: [facility] INVASIVE CV LAB;  Service: Cardiovascular;  Laterality: N/A;   Patient Active Problem List   Diagnosis Date Noted   Nicotine  abuse 01/21/2024   Chronic diastolic heart failure (HCC) 01/21/2024   ERRONEOUS ENCOUNTER--DISREGARD 04/26/2023   Coronary  artery disease 06/29/2022   CHF (congestive heart failure) (HCC) 06/27/2022   Acute respiratory failure with hypoxia (HCC) 06/27/2022   DM II (diabetes mellitus, type II), controlled (HCC) 06/27/2022   HLD (hyperlipidemia) 04/18/2022   (HFpEF) heart failure with preserved ejection fraction (HCC) 04/18/2022   Mixed stress and urge urinary incontinence 06/01/2021   Continuous leakage of urine 06/01/2021   S/P hysterectomy 06/01/2021   Cystocele, midline 06/01/2021   OAB (overactive bladder) 06/01/2021   Preoperative cardiovascular examination 01/29/2017   MDD (major depressive disorder), recurrent, severe, with psychosis (HCC) 12/30/2016   Tobacco abuse counseling 09/13/2016   Gastroesophageal reflux disease 05/02/2016   Hiatal hernia    History of colonic polyps    Diverticulosis of colon without hemorrhage    History of colon cancer 07/20/2014   Dysphagia 07/20/2014   Lower abdominal pain 09/30/2013   Anxiety, generalized 08/08/2013   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 08/07/2013   Noncompliance with medications 08/07/2013   Rectal bleeding 05/09/2013   LLQ pain 11/28/2012   Hemorrhagic cyst of ovary 11/28/2012   Nausea with vomiting 11/28/2012   UNSPECIFIED URINARY INCONTINENCE 09/03/2008   ACUTE CYSTITIS 07/19/2008   OBESITY 11/22/2007   Psychosis (HCC) 11/22/2007   Essential hypertension 11/22/2007   Backache 11/22/2007   Lateral epicondylitis 11/22/2007    PCP: Vick Lurie, FNP  REFERRING PROVIDER: Margrette Taft BRAVO, MD  REFERRING DIAG: M54.50 (ICD-10-CM) - Lumbar pain M16.11 (ICD-10-CM) - Arthritis of right hip  Rationale for Evaluation and Treatment: Rehabilitation  THERAPY DIAG:  No diagnosis found.  ONSET DATE: ***  SUBJECTIVE:                                                                                                                                                                                           SUBJECTIVE  STATEMENT: ***  PERTINENT HISTORY:  ***  PAIN:  Are you having pain? {OPRCPAIN:27236}  PRECAUTIONS: {Therapy precautions:24002}  RED FLAGS: {PT Red Flags:29287}   WEIGHT BEARING RESTRICTIONS: {Yes ***/No:24003}  FALLS:  Has patient fallen in last 6 months? {fallsyesno:27318}  LIVING ENVIRONMENT: Lives with: {OPRC lives with:25569::lives with their family} Lives in: {Lives in:25570} Stairs: {opstairs:27293} Has following equipment at home: {Assistive devices:23999}  OCCUPATION: ***  PLOF: {PLOF:24004}  PATIENT GOALS: ***  NEXT MD  VISIT: ***  OBJECTIVE:  Note: Objective measures were completed at Evaluation unless otherwise noted.  DIAGNOSTIC FINDINGS:  ***  PATIENT SURVEYS:  Modified Oswestry:  MODIFIED OSWESTRY DISABILITY SCALE  Date: *** Score  Pain intensity {ODI 1:32962}  2. Personal care (washing, dressing, etc.) {ODI 2:32963}  3. Lifting {ODI 3:32964}  4. Walking {ODI 4:32965}  5. Sitting {ODI 5:32966}  6. Standing {ODI 6:32967}  7. Sleeping {ODI 7:32968}  8. Social Life {ODI 8:32969}  9. Traveling {ODI 9:32970}  10. Employment/ Homemaking {ODI 10:32971}  Total ***/50   Interpretation of scores: Score Category Description  0-20% Minimal Disability The patient can cope with most living activities. Usually no treatment is indicated apart from advice on lifting, sitting and exercise  21-40% Moderate Disability The patient experiences more pain and difficulty with sitting, lifting and standing. Travel and social life are more difficult and they may be disabled from work. Personal care, sexual activity and sleeping are not grossly affected, and the patient can usually be managed by conservative means  41-60% Severe Disability Pain remains the main problem in this group, but activities of daily living are affected. These patients require a detailed investigation  61-80% Crippled Back pain impinges on all aspects of the patient's life. Positive intervention  is required  81-100% Bed-bound  These patients are either bed-bound or exaggerating their symptoms  Bluford FORBES Zoe DELENA Karon DELENA, et al. Surgery versus conservative management of stable thoracolumbar fracture: the PRESTO feasibility RCT. [place]): [employer]; 2021 Nov. [facility] Technology Assessment, No. 25.62.) Appendix 3, Oswestry Disability Index category descriptors. Available from: FindJewel[employer].cz  Minimally Clinically Important Difference (MCID) = 12.8%  COGNITION: Overall cognitive status: {cognition:24006}     SENSATION: {sensation:27233}  MUSCLE LENGTH: Hamstrings: Right *** deg; Left *** deg Debby test: Right *** deg; Left *** deg  POSTURE: {posture:25561}  PALPATION: ***  LUMBAR ROM:   AROM eval  Flexion   Extension   Right lateral flexion   Left lateral flexion   Right rotation   Left rotation    (Blank rows = not tested)  LOWER EXTREMITY ROM:     {AROM/PROM:27142}  Right eval Left eval  Hip flexion    Hip extension    Hip abduction    Hip adduction    Hip internal rotation    Hip external rotation    Knee flexion    Knee extension    Ankle dorsiflexion    Ankle plantarflexion    Ankle inversion    Ankle eversion     (Blank rows = not tested)  LOWER EXTREMITY MMT:    MMT Right eval Left eval  Hip flexion    Hip extension    Hip abduction    Hip adduction    Hip internal rotation    Hip external rotation    Knee flexion    Knee extension    Ankle dorsiflexion    Ankle plantarflexion    Ankle inversion    Ankle eversion     (Blank rows = not tested)  LUMBAR SPECIAL TESTS:  {lumbar special test:25242}  FUNCTIONAL TESTS:  {Functional tests:24029}  GAIT: Distance walked: *** Assistive device utilized: {Assistive devices:23999} Level of assistance: {Levels of assistance:24026} Comments: ***  TREATMENT DATE: 02/21/24 physical therapy evaluation and HEP instruction  PATIENT EDUCATION:  Education details: Patient educated on exam findings, POC, scope of PT, HEP, and ***. Person educated: Patient Education method: Explanation, Demonstration, and Handouts Education comprehension: verbalized understanding, returned demonstration, verbal cues required, and tactile cues required  HOME EXERCISE PROGRAM: ***  ASSESSMENT:  CLINICAL IMPRESSION: Patient is a 69 y.o. female who was seen today for physical therapy evaluation and treatment for M54.50 (ICD-10-CM) - Lumbar pain M16.11 (ICD-10-CM) - Arthritis of right hip.   OBJECTIVE IMPAIRMENTS: {opptimpairments:25111}.   ACTIVITY LIMITATIONS: {activitylimitations:27494}  PARTICIPATION LIMITATIONS: {participationrestrictions:25113}  PERSONAL FACTORS: {Personal factors:25162} are also affecting patient's functional outcome.   REHAB POTENTIAL: Good  CLINICAL DECISION MAKING: Evolving/moderate complexity  EVALUATION COMPLEXITY: Moderate   GOALS: Goals reviewed with patient? No  SHORT TERM GOALS: Target date: ***  *** Baseline: Goal status: INITIAL  2.  *** Baseline:  Goal status: INITIAL  3.  *** Baseline:  Goal status: INITIAL  4.  *** Baseline:  Goal status: INITIAL  5.  *** Baseline:  Goal status: INITIAL  6.  *** Baseline:  Goal status: INITIAL  LONG TERM GOALS: Target date: ***  *** Baseline:  Goal status: INITIAL  2.  *** Baseline:  Goal status: INITIAL  3.  *** Baseline:  Goal status: INITIAL  4.  *** Baseline:  Goal status: INITIAL  5.  *** Baseline:  Goal status: INITIAL  6.  *** Baseline:  Goal status: INITIAL  PLAN:  PT FREQUENCY: {rehab frequency:25116}  PT DURATION: {rehab duration:25117}  PLANNED INTERVENTIONS: 97164- PT Re-evaluation, 97110-Therapeutic exercises, 97530- Therapeutic activity, 97112- Neuromuscular re-education,  97535- Self Care, 02859- Manual therapy, [id number]- Gait training, [id number]- Orthotic Fit/training, [id number]- Canalith repositioning, [id number]- Aquatic Therapy, 97760- Splinting, 97597- Wound care (first 20 sq cm), 97598- Wound care (each additional 20 sq cm)Patient/Family education, Balance training, Stair training, Taping, Dry Needling, Joint mobilization, Joint manipulation, Spinal manipulation, Spinal mobilization, Scar mobilization, and DME instructions. SABRA  PLAN FOR NEXT SESSION: Review HEP and goals   10:35 AM, 02/16/24 Marilouise Densmore Small Lilyona Richner MPT [place] [facility] [place] [phone]

## 2024-02-21 ENCOUNTER — Ambulatory Visit (HOSPITAL_COMMUNITY)

## 2024-02-21 ENCOUNTER — Telehealth (HOSPITAL_COMMUNITY): Payer: Self-pay

## 2024-02-21 NOTE — Telephone Encounter (Signed)
 Left message on voicemail regarding missed appointment today for physical therapy evaluation.  Left call back number if she would like to reschedule.    1:23 PM, 02/21/24 Ashley Patrick MPT [place] [facility] [place] [phone]

## 2024-03-26 ENCOUNTER — Telehealth: Payer: Self-pay | Admitting: Internal Medicine

## 2024-03-26 MED ORDER — FUROSEMIDE 40 MG PO TABS: 40.0000 mg | ORAL_TABLET | Freq: Two times a day (BID) | ORAL | 3 refills | Status: AC

## 2024-03-26 NOTE — Telephone Encounter (Signed)
*  STAT* If patient is at the pharmacy, call can be transferred to refill team.   1. Which medications need to be refilled? (please list name of each medication and dose if known) furosemide (LASIX) 40 MG tablet   2. Which pharmacy/location (including street and city if local pharmacy) is medication to be sent to? [id number] - [place], [place] - [address]   3. Do they need a 30 day or 90 day supply? 90

## 2024-03-26 NOTE — Telephone Encounter (Signed)
 RX sent in

## 2024-07-07 ENCOUNTER — Encounter (HOSPITAL_COMMUNITY): Payer: Self-pay | Admitting: Adult Health

## 2024-07-07 ENCOUNTER — Other Ambulatory Visit (HOSPITAL_COMMUNITY): Payer: Self-pay | Admitting: Adult Health

## 2024-07-07 DIAGNOSIS — Z9049 Acquired absence of other specified parts of digestive tract: Secondary | ICD-10-CM

## 2024-07-07 DIAGNOSIS — Z1231 Encounter for screening mammogram for malignant neoplasm of breast: Secondary | ICD-10-CM

## 2024-07-10 ENCOUNTER — Inpatient Hospital Stay (HOSPITAL_COMMUNITY): Admission: RE | Admit: 2024-07-10 | Source: Ambulatory Visit

## 2024-07-11 ENCOUNTER — Ambulatory Visit (HOSPITAL_COMMUNITY)
Admission: RE | Admit: 2024-07-11 | Discharge: 2024-07-11 | Disposition: A | Source: Ambulatory Visit | Attending: Adult Health

## 2024-07-11 DIAGNOSIS — Z1231 Encounter for screening mammogram for malignant neoplasm of breast: Secondary | ICD-10-CM | POA: Diagnosis present

## 2024-08-12 ENCOUNTER — Encounter: Admitting: Obstetrics & Gynecology
